# Patient Record
Sex: Male | Born: 1940 | Race: White | Hispanic: No | Smoking: Former smoker
Health system: Southern US, Community
[De-identification: ages and names within clinical notes are randomized; demographics above are authoritative.]

## PROBLEM LIST (undated history)

## (undated) DIAGNOSIS — J189 Pneumonia, unspecified organism: Secondary | ICD-10-CM

## (undated) DIAGNOSIS — I2510 Atherosclerotic heart disease of native coronary artery without angina pectoris: Secondary | ICD-10-CM

## (undated) DIAGNOSIS — K439 Ventral hernia without obstruction or gangrene: Secondary | ICD-10-CM

## (undated) DIAGNOSIS — Z86010 Personal history of colonic polyps: Secondary | ICD-10-CM

## (undated) DIAGNOSIS — L309 Dermatitis, unspecified: Secondary | ICD-10-CM

## (undated) DIAGNOSIS — K5520 Angiodysplasia of colon without hemorrhage: Secondary | ICD-10-CM

## (undated) DIAGNOSIS — M1990 Unspecified osteoarthritis, unspecified site: Secondary | ICD-10-CM

## (undated) DIAGNOSIS — E119 Type 2 diabetes mellitus without complications: Secondary | ICD-10-CM

## (undated) DIAGNOSIS — G629 Polyneuropathy, unspecified: Secondary | ICD-10-CM

## (undated) DIAGNOSIS — D649 Anemia, unspecified: Secondary | ICD-10-CM

## (undated) DIAGNOSIS — K219 Gastro-esophageal reflux disease without esophagitis: Secondary | ICD-10-CM

## (undated) DIAGNOSIS — C649 Malignant neoplasm of unspecified kidney, except renal pelvis: Secondary | ICD-10-CM

## (undated) DIAGNOSIS — S46219A Strain of muscle, fascia and tendon of other parts of biceps, unspecified arm, initial encounter: Secondary | ICD-10-CM

## (undated) DIAGNOSIS — I219 Acute myocardial infarction, unspecified: Secondary | ICD-10-CM

## (undated) DIAGNOSIS — Z9289 Personal history of other medical treatment: Secondary | ICD-10-CM

## (undated) DIAGNOSIS — I499 Cardiac arrhythmia, unspecified: Secondary | ICD-10-CM

## (undated) DIAGNOSIS — E785 Hyperlipidemia, unspecified: Secondary | ICD-10-CM

## (undated) DIAGNOSIS — I10 Essential (primary) hypertension: Secondary | ICD-10-CM

## (undated) DIAGNOSIS — E669 Obesity, unspecified: Secondary | ICD-10-CM

## (undated) HISTORY — DX: Angiodysplasia of colon without hemorrhage: K55.20

## (undated) HISTORY — PX: TONSILLECTOMY: SUR1361

## (undated) HISTORY — PX: COLONOSCOPY W/ BIOPSIES AND POLYPECTOMY: SHX1376

## (undated) HISTORY — DX: Essential (primary) hypertension: I10

## (undated) HISTORY — DX: Dermatitis, unspecified: L30.9

## (undated) HISTORY — PX: FRACTURE SURGERY: SHX138

## (undated) HISTORY — DX: Personal history of colonic polyps: Z86.010

## (undated) HISTORY — DX: Obesity, unspecified: E66.9

## (undated) HISTORY — PX: INGUINAL HERNIA REPAIR: SHX194

## (undated) HISTORY — DX: Malignant neoplasm of unspecified kidney, except renal pelvis: C64.9

## (undated) HISTORY — PX: COLONOSCOPY: SHX174

## (undated) HISTORY — DX: Hyperlipidemia, unspecified: E78.5

## (undated) HISTORY — DX: Strain of muscle, fascia and tendon of other parts of biceps, unspecified arm, initial encounter: S46.219A

## (undated) HISTORY — DX: Ventral hernia without obstruction or gangrene: K43.9

## (undated) HISTORY — PX: CORONARY ANGIOPLASTY: SHX604

---

## 1997-12-10 ENCOUNTER — Emergency Department (HOSPITAL_COMMUNITY): Admission: EM | Admit: 1997-12-10 | Discharge: 1997-12-10 | Payer: Self-pay | Admitting: Emergency Medicine

## 1998-01-23 ENCOUNTER — Ambulatory Visit (HOSPITAL_BASED_OUTPATIENT_CLINIC_OR_DEPARTMENT_OTHER): Admission: RE | Admit: 1998-01-23 | Discharge: 1998-01-23 | Payer: Self-pay | Admitting: General Surgery

## 1998-04-10 ENCOUNTER — Ambulatory Visit (HOSPITAL_BASED_OUTPATIENT_CLINIC_OR_DEPARTMENT_OTHER): Admission: RE | Admit: 1998-04-10 | Discharge: 1998-04-10 | Payer: Self-pay | Admitting: General Surgery

## 1998-09-09 DIAGNOSIS — C649 Malignant neoplasm of unspecified kidney, except renal pelvis: Secondary | ICD-10-CM

## 1998-09-09 HISTORY — DX: Malignant neoplasm of unspecified kidney, except renal pelvis: C64.9

## 1998-09-09 HISTORY — PX: NEPHRECTOMY: SHX65

## 1999-02-13 ENCOUNTER — Encounter: Payer: Self-pay | Admitting: Urology

## 1999-02-14 ENCOUNTER — Inpatient Hospital Stay (HOSPITAL_COMMUNITY): Admission: RE | Admit: 1999-02-14 | Discharge: 1999-02-20 | Payer: Self-pay | Admitting: Urology

## 1999-09-07 ENCOUNTER — Encounter: Payer: Self-pay | Admitting: Urology

## 1999-09-07 ENCOUNTER — Encounter: Admission: RE | Admit: 1999-09-07 | Discharge: 1999-09-07 | Payer: Self-pay | Admitting: Urology

## 2000-03-11 ENCOUNTER — Encounter: Payer: Self-pay | Admitting: Urology

## 2000-03-11 ENCOUNTER — Ambulatory Visit (HOSPITAL_COMMUNITY): Admission: RE | Admit: 2000-03-11 | Discharge: 2000-03-11 | Payer: Self-pay | Admitting: Urology

## 2001-08-03 ENCOUNTER — Ambulatory Visit (HOSPITAL_COMMUNITY): Admission: RE | Admit: 2001-08-03 | Discharge: 2001-08-03 | Payer: Self-pay | Admitting: Urology

## 2001-08-03 ENCOUNTER — Encounter: Payer: Self-pay | Admitting: Urology

## 2003-01-07 ENCOUNTER — Encounter: Admission: RE | Admit: 2003-01-07 | Discharge: 2003-01-07 | Payer: Self-pay | Admitting: Urology

## 2003-01-07 ENCOUNTER — Encounter: Payer: Self-pay | Admitting: Urology

## 2003-07-19 ENCOUNTER — Encounter: Admission: RE | Admit: 2003-07-19 | Discharge: 2003-10-17 | Payer: Self-pay | Admitting: Family Medicine

## 2004-11-29 ENCOUNTER — Ambulatory Visit: Payer: Self-pay | Admitting: Family Medicine

## 2005-01-03 ENCOUNTER — Ambulatory Visit: Payer: Self-pay | Admitting: Family Medicine

## 2005-02-20 ENCOUNTER — Ambulatory Visit: Payer: Self-pay | Admitting: Family Medicine

## 2005-02-28 ENCOUNTER — Ambulatory Visit: Payer: Self-pay | Admitting: Family Medicine

## 2006-07-10 ENCOUNTER — Ambulatory Visit: Payer: Self-pay | Admitting: Family Medicine

## 2006-07-25 ENCOUNTER — Ambulatory Visit: Payer: Self-pay | Admitting: Family Medicine

## 2006-07-25 LAB — CONVERTED CEMR LAB
ALT: 69 units/L — ABNORMAL HIGH (ref 0–40)
AST: 38 units/L — ABNORMAL HIGH (ref 0–37)
BUN: 15 mg/dL (ref 6–23)
Basophils Relative: 0.3 % (ref 0.0–1.0)
Calcium: 9.6 mg/dL (ref 8.4–10.5)
Chloride: 101 meq/L (ref 96–112)
Cholesterol: 198 mg/dL (ref 0–200)
Eosinophil percent: 5.7 % — ABNORMAL HIGH (ref 0.0–5.0)
Glucose, Bld: 172 mg/dL — ABNORMAL HIGH (ref 70–99)
HCT: 47.3 % (ref 39.0–52.0)
HDL: 36.8 mg/dL — ABNORMAL LOW (ref >39.0)
Hemoglobin: 15.5 g/dL (ref 13.0–17.0)
LDL Cholesterol: 131 mg/dL — ABNORMAL HIGH (ref 0–99)
Lymphocytes Relative: 29.7 % (ref 12.0–46.0)
MCV: 97.7 fL (ref 78.0–100.0)
Monocytes Absolute: 0.6 10*3/uL (ref 0.2–0.7)
Neutrophils Relative %: 51.9 % (ref 43.0–77.0)
PSA: 3.69 ng/mL (ref 0.10–4.00)
Potassium: 4.2 meq/L (ref 3.5–5.1)
TSH: 1.41 microintl units/mL (ref 0.35–5.50)
WBC: 4.8 10*3/uL (ref 4.5–10.5)

## 2006-08-06 ENCOUNTER — Ambulatory Visit: Payer: Self-pay | Admitting: Family Medicine

## 2006-08-28 ENCOUNTER — Ambulatory Visit: Payer: Self-pay | Admitting: Family Medicine

## 2006-11-27 ENCOUNTER — Ambulatory Visit: Payer: Self-pay | Admitting: Family Medicine

## 2006-11-27 LAB — CONVERTED CEMR LAB
Cholesterol: 208 mg/dL (ref 0–200)
Creatinine,U: 95.5 mg/dL
Direct LDL: 137.4 mg/dL
GFR calc non Af Amer: 65 mL/min
Glucose, Bld: 180 mg/dL — ABNORMAL HIGH (ref 70–99)
HDL: 37.1 mg/dL — ABNORMAL LOW (ref >39.0)
Potassium: 4 meq/L (ref 3.5–5.1)
Sodium: 137 meq/L (ref 135–145)
Total CHOL/HDL Ratio: 5.6
VLDL: 56 mg/dL — ABNORMAL HIGH (ref 0–40)

## 2007-04-23 DIAGNOSIS — I10 Essential (primary) hypertension: Secondary | ICD-10-CM | POA: Insufficient documentation

## 2007-08-18 DIAGNOSIS — J157 Pneumonia due to Mycoplasma pneumoniae: Secondary | ICD-10-CM

## 2007-09-08 ENCOUNTER — Ambulatory Visit: Payer: Self-pay | Admitting: Family Medicine

## 2007-11-06 ENCOUNTER — Ambulatory Visit: Payer: Self-pay | Admitting: Family Medicine

## 2007-11-06 LAB — CONVERTED CEMR LAB
ALT: 75 units/L — ABNORMAL HIGH (ref 0–53)
AST: 44 units/L — ABNORMAL HIGH (ref 0–37)
Albumin: 3.8 g/dL (ref 3.5–5.2)
Alkaline Phosphatase: 50 units/L (ref 39–117)
BUN: 14 mg/dL (ref 6–23)
Basophils Absolute: 0.1 10*3/uL (ref 0.0–0.1)
Bilirubin Urine: NEGATIVE
Bilirubin, Direct: 0.2 mg/dL (ref 0.0–0.3)
Blood in Urine, dipstick: NEGATIVE
Calcium: 9.5 mg/dL (ref 8.4–10.5)
Chloride: 99 meq/L (ref 96–112)
Cholesterol: 206 mg/dL (ref 0–200)
Eosinophils Absolute: 0.4 10*3/uL (ref 0.0–0.6)
Eosinophils Relative: 7.2 % — ABNORMAL HIGH (ref 0.0–5.0)
GFR calc Af Amer: 60 mL/min
GFR calc non Af Amer: 50 mL/min
Glucose, Bld: 209 mg/dL — ABNORMAL HIGH (ref 70–99)
HDL: 34.7 mg/dL — ABNORMAL LOW (ref >39.0)
Hgb A1c MFr Bld: 8.2 % — ABNORMAL HIGH (ref 4.6–6.0)
Ketones, urine, test strip: NEGATIVE
Lymphocytes Relative: 26.9 % (ref 12.0–46.0)
MCHC: 33 g/dL (ref 30.0–36.0)
MCV: 98.5 fL (ref 78.0–100.0)
Microalb Creat Ratio: 36.3 mg/g — ABNORMAL HIGH (ref 0.0–30.0)
Monocytes Relative: 11.5 % — ABNORMAL HIGH (ref 3.0–11.0)
Neutro Abs: 3 10*3/uL (ref 1.4–7.7)
Nitrite: NEGATIVE
PSA: 1.06 ng/mL (ref 0.10–4.00)
Platelets: 233 10*3/uL (ref 150–400)
RBC: 4.9 M/uL (ref 4.22–5.81)
Specific Gravity, Urine: 1.02
TSH: 1.75 microintl units/mL (ref 0.35–5.50)
Triglycerides: 278 mg/dL (ref 0–149)
WBC: 5.6 10*3/uL (ref 4.5–10.5)

## 2007-11-13 ENCOUNTER — Ambulatory Visit: Payer: Self-pay | Admitting: Family Medicine

## 2007-11-13 DIAGNOSIS — E785 Hyperlipidemia, unspecified: Secondary | ICD-10-CM

## 2007-11-13 DIAGNOSIS — L301 Dyshidrosis [pompholyx]: Secondary | ICD-10-CM | POA: Insufficient documentation

## 2007-11-13 DIAGNOSIS — E669 Obesity, unspecified: Secondary | ICD-10-CM

## 2007-11-19 ENCOUNTER — Ambulatory Visit: Payer: Self-pay | Admitting: Internal Medicine

## 2007-12-03 ENCOUNTER — Ambulatory Visit: Payer: Self-pay | Admitting: Internal Medicine

## 2007-12-03 ENCOUNTER — Encounter: Payer: Self-pay | Admitting: Internal Medicine

## 2007-12-03 ENCOUNTER — Encounter: Payer: Self-pay | Admitting: Family Medicine

## 2007-12-15 ENCOUNTER — Ambulatory Visit: Payer: Self-pay | Admitting: Family Medicine

## 2008-02-05 ENCOUNTER — Telehealth: Payer: Self-pay | Admitting: Family Medicine

## 2008-03-18 ENCOUNTER — Ambulatory Visit: Payer: Self-pay | Admitting: Family Medicine

## 2008-03-18 LAB — CONVERTED CEMR LAB
Chloride: 102 meq/L (ref 96–112)
Cholesterol: 154 mg/dL (ref 0–200)
GFR calc Af Amer: 46 mL/min
GFR calc non Af Amer: 38 mL/min
Glucose, Bld: 93 mg/dL (ref 70–99)
Hgb A1c MFr Bld: 7.2 % — ABNORMAL HIGH (ref 4.6–6.0)
Microalb Creat Ratio: 18.7 mg/g (ref 0.0–30.0)
Microalb, Ur: 1.3 mg/dL (ref 0.0–1.9)
Potassium: 4.7 meq/L (ref 3.5–5.1)
Sodium: 140 meq/L (ref 135–145)
VLDL: 26 mg/dL (ref 0–40)

## 2008-03-25 ENCOUNTER — Telehealth: Payer: Self-pay | Admitting: Family Medicine

## 2008-05-31 ENCOUNTER — Encounter: Admission: RE | Admit: 2008-05-31 | Discharge: 2008-05-31 | Payer: Self-pay | Admitting: General Surgery

## 2008-06-08 ENCOUNTER — Ambulatory Visit: Payer: Self-pay | Admitting: Family Medicine

## 2008-06-08 LAB — CONVERTED CEMR LAB
BUN: 20 mg/dL (ref 6–23)
Creatinine, Ser: 1.3 mg/dL (ref 0.4–1.5)
GFR calc Af Amer: 71 mL/min
GFR calc non Af Amer: 59 mL/min
Hgb A1c MFr Bld: 6.6 % — ABNORMAL HIGH (ref 4.6–6.0)
Potassium: 5 meq/L (ref 3.5–5.1)

## 2008-06-17 ENCOUNTER — Ambulatory Visit: Payer: Self-pay | Admitting: Family Medicine

## 2008-08-08 DIAGNOSIS — J069 Acute upper respiratory infection, unspecified: Secondary | ICD-10-CM | POA: Insufficient documentation

## 2008-08-15 ENCOUNTER — Ambulatory Visit: Payer: Self-pay | Admitting: Family Medicine

## 2008-11-11 ENCOUNTER — Ambulatory Visit: Payer: Self-pay | Admitting: Family Medicine

## 2008-11-11 LAB — CONVERTED CEMR LAB
ALT: 57 units/L — ABNORMAL HIGH (ref 0–53)
Basophils Relative: 0.7 % (ref 0.0–3.0)
Bilirubin, Direct: 0.1 mg/dL (ref 0.0–0.3)
CO2: 30 meq/L (ref 19–32)
Calcium: 9.3 mg/dL (ref 8.4–10.5)
Eosinophils Relative: 6.9 % — ABNORMAL HIGH (ref 0.0–5.0)
Glucose, Bld: 169 mg/dL — ABNORMAL HIGH (ref 70–99)
Hemoglobin: 16.2 g/dL (ref 13.0–17.0)
LDL Cholesterol: 122 mg/dL — ABNORMAL HIGH (ref 0–99)
Lymphocytes Relative: 27.5 % (ref 12.0–46.0)
Microalb, Ur: 16.9 mg/dL — ABNORMAL HIGH (ref 0.0–1.9)
Monocytes Relative: 10.9 % (ref 3.0–12.0)
Neutro Abs: 2.8 10*3/uL (ref 1.4–7.7)
Nitrite: NEGATIVE
RBC: 4.68 M/uL (ref 4.22–5.81)
RDW: 12 % (ref 11.5–14.6)
Sodium: 140 meq/L (ref 135–145)
TSH: 1.8 microintl units/mL (ref 0.35–5.50)
Total CHOL/HDL Ratio: 5.2
Total Protein: 6.7 g/dL (ref 6.0–8.3)
Urobilinogen, UA: 0.2
WBC Urine, dipstick: NEGATIVE
WBC: 5.3 10*3/uL (ref 4.5–10.5)

## 2008-11-21 ENCOUNTER — Ambulatory Visit: Payer: Self-pay | Admitting: Family Medicine

## 2008-11-29 ENCOUNTER — Telehealth: Payer: Self-pay | Admitting: Family Medicine

## 2008-12-05 ENCOUNTER — Ambulatory Visit: Payer: Self-pay | Admitting: Family Medicine

## 2008-12-05 DIAGNOSIS — H6120 Impacted cerumen, unspecified ear: Secondary | ICD-10-CM

## 2009-01-02 ENCOUNTER — Ambulatory Visit: Payer: Self-pay | Admitting: Family Medicine

## 2009-02-10 ENCOUNTER — Emergency Department (HOSPITAL_COMMUNITY): Admission: EM | Admit: 2009-02-10 | Discharge: 2009-02-11 | Payer: Self-pay | Admitting: Emergency Medicine

## 2009-04-27 ENCOUNTER — Ambulatory Visit: Payer: Self-pay | Admitting: Family Medicine

## 2009-04-27 LAB — CONVERTED CEMR LAB
CO2: 31 meq/L (ref 19–32)
Chloride: 104 meq/L (ref 96–112)
Creatinine, Ser: 1.3 mg/dL (ref 0.4–1.5)
Sodium: 141 meq/L (ref 135–145)

## 2009-05-04 ENCOUNTER — Ambulatory Visit: Payer: Self-pay | Admitting: Family Medicine

## 2009-05-04 DIAGNOSIS — T6391XA Toxic effect of contact with unspecified venomous animal, accidental (unintentional), initial encounter: Secondary | ICD-10-CM

## 2009-08-21 LAB — HM DIABETES EYE EXAM: HM Diabetic Eye Exam: NORMAL

## 2009-09-18 ENCOUNTER — Encounter: Payer: Self-pay | Admitting: *Deleted

## 2009-11-17 ENCOUNTER — Ambulatory Visit: Payer: Self-pay | Admitting: Family Medicine

## 2009-11-17 LAB — CONVERTED CEMR LAB
AST: 42 units/L — ABNORMAL HIGH (ref 0–37)
Albumin: 3.9 g/dL (ref 3.5–5.2)
Basophils Absolute: 0.1 10*3/uL (ref 0.0–0.1)
Bilirubin Urine: NEGATIVE
Blood in Urine, dipstick: NEGATIVE
CO2: 32 meq/L (ref 19–32)
Chloride: 107 meq/L (ref 96–112)
Direct LDL: 151.3 mg/dL
Eosinophils Absolute: 0.4 10*3/uL (ref 0.0–0.7)
Glucose, Urine, Semiquant: NEGATIVE
HCT: 45 % (ref 39.0–52.0)
HDL: 44.5 mg/dL (ref >39.00)
Hgb A1c MFr Bld: 7.2 % — ABNORMAL HIGH (ref 4.6–6.5)
Ketones, urine, test strip: NEGATIVE
Lymphocytes Relative: 28.8 % (ref 12.0–46.0)
Lymphs Abs: 1.3 10*3/uL (ref 0.7–4.0)
Microalb Creat Ratio: 197.7 mg/g — ABNORMAL HIGH (ref 0.0–30.0)
Monocytes Relative: 11.6 % (ref 3.0–12.0)
Platelets: 216 10*3/uL (ref 150.0–400.0)
RDW: 12.8 % (ref 11.5–14.6)
Sodium: 141 meq/L (ref 135–145)
TSH: 1.88 microintl units/mL (ref 0.35–5.50)
Total CHOL/HDL Ratio: 5
Total Protein: 7 g/dL (ref 6.0–8.3)
Triglycerides: 202 mg/dL — ABNORMAL HIGH (ref 0.0–149.0)
pH: 5

## 2009-11-24 ENCOUNTER — Ambulatory Visit: Payer: Self-pay | Admitting: Family Medicine

## 2009-11-24 LAB — HM DIABETES FOOT EXAM

## 2009-12-25 ENCOUNTER — Ambulatory Visit: Payer: Self-pay | Admitting: Family Medicine

## 2010-05-07 ENCOUNTER — Ambulatory Visit: Payer: Self-pay | Admitting: Family Medicine

## 2010-05-07 LAB — CONVERTED CEMR LAB
Calcium: 9.1 mg/dL (ref 8.4–10.5)
Chloride: 99 meq/L (ref 96–112)
Creatinine, Ser: 1.1 mg/dL (ref 0.4–1.5)
GFR calc non Af Amer: 71.27 mL/min (ref >60)
Hgb A1c MFr Bld: 7.6 % — ABNORMAL HIGH (ref 4.6–6.5)

## 2010-05-18 ENCOUNTER — Ambulatory Visit: Payer: Self-pay | Admitting: Family Medicine

## 2010-08-21 ENCOUNTER — Ambulatory Visit: Payer: Self-pay | Admitting: Family Medicine

## 2010-08-21 LAB — CONVERTED CEMR LAB
CO2: 31 meq/L (ref 19–32)
Calcium: 9.1 mg/dL (ref 8.4–10.5)
Creatinine, Ser: 1.3 mg/dL (ref 0.4–1.5)
GFR calc non Af Amer: 57.09 mL/min — ABNORMAL LOW (ref >60.00)
Hgb A1c MFr Bld: 7.8 % — ABNORMAL HIGH (ref 4.6–6.5)
Sodium: 140 meq/L (ref 135–145)

## 2010-08-22 ENCOUNTER — Encounter: Payer: Self-pay | Admitting: Internal Medicine

## 2010-08-28 ENCOUNTER — Ambulatory Visit: Payer: Self-pay | Admitting: Family Medicine

## 2010-09-09 HISTORY — PX: INGUINAL HERNIA REPAIR: SUR1180

## 2010-09-24 ENCOUNTER — Encounter
Admission: RE | Admit: 2010-09-24 | Discharge: 2010-09-24 | Payer: Self-pay | Source: Home / Self Care | Attending: General Surgery | Admitting: General Surgery

## 2010-10-09 NOTE — Assessment & Plan Note (Signed)
Summary: 30 min ov/njr   Vital Signs:  Patient profile:   70 year old male Height:      71.5 inches Weight:      254 pounds BMI:     35.06 Temp:     98.6 degrees F oral BP sitting:   150 / 98  (left arm) Cuff size:   regular  Vitals Entered By: Kern Reap CMA Duncan Dull) (November 24, 2009 8:53 AM)  Reason for Visit cpx  History of Present Illness: Charles [name] is a 70 year old male, nonsmoker, who comes in today for evaluation of hypertension, diabetes, obesity.  His diabetes is treated with Glucophage 1000 mg b.i.d.  His blood sugar is 157 with an A1c of 7.2%.  We discussed various options to decrease his blood sugar to normal and to decrease his hemoglobin A1c below 6.5%.  Patient elects medication to stay the same but start diet and exercise.  BP 150/98 here at home.  He states is 135/92.  However, he doesn't check it daily.  Advised to check his BP daily and return in 4 weeks for follow-up.  If blood pressure above goal which is 130/80.  His weight is 254.  He also takes one aspirin tablet daily.  He gets   Routine eye care.,dental care, colonoscopy done, and GI, tetanus, 2003, seasonal flu 2010, Pneumovax 2007, diabetic eye exam December 2010 no retinopathy.  He also uses the Lidex p.r.n. for eczema.  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  Preventive Screening-Counseling & Management  Alcohol-Tobacco     Smoking Status: quit  Allergies: 1)  ! * Bee Stings  Past History:  Past medical, surgical, family and social histories (including risk factors) reviewed, and no changes noted (except as noted below).  Past Medical History: Reviewed history from 06/17/2008 and no changes required. High Cholesterol Obesity Diabetes mellitus, type II Hypertension eczema muscle tear, right biceps ventral hernia left inguinal hernia repair x 3  Past Surgical History: Reviewed history from 04/23/2007 and no changes required. Hernia Surgery x3 PMH-FH-SH  reviewed-no changes except otherwise noted  Family History: Reviewed history from 04/23/2007 and no changes required. Family History Diabetes 1st degree relative Family History Other cancer Family History of Cardiovascular disorder Family History of Respiratory disease  Social History: Reviewed history from 03/18/2008 and no changes required. Occupation: Single Alcohol use-yes Drug use-no Retired Former Smoker     quit 2000 Smoking Status:  quit  Review of Systems      See HPI  Physical Exam  General:  Well-developed,well-nourished,in no acute distress; alert,appropriate and cooperative throughout examination Head:  Normocephalic and atraumatic without obvious abnormalities. No apparent alopecia or balding. Eyes:  No corneal or conjunctival inflammation noted. EOMI. Perrla. Funduscopic exam benign, without hemorrhages, exudates or papilledema. Vision grossly normal. Ears:  External ear exam shows no significant lesions or deformities.  Otoscopic examination reveals clear canals, tympanic membranes are intact bilaterally without bulging, retraction, inflammation or discharge. Hearing is decreased because of ear wax Nose:  External nasal examination shows no deformity or inflammation. Nasal mucosa are pink and moist without lesions or exudates. Mouth:  Oral mucosa and oropharynx without lesions or exudates.  Teeth in good repair. Neck:  No deformities, masses, or tenderness noted. Chest Wall:  No deformities, masses, tenderness or gynecomastia noted. Breasts:  No masses or gynecomastia noted Lungs:  Normal respiratory effort, chest expands symmetrically. Lungs are clear to auscultation, no crackles or wheezes. Heart:  Normal rate and regular rhythm.  S1 and S2 normal without gallop, murmur, click, rub or other extra sounds. Abdomen:  ventral hernia in the previous scar.  Otherwise abdominal exam is negative Rectal:  No external abnormalities noted. Normal sphincter tone. No rectal  masses or tenderness. Genitalia:  Testes bilaterally descended without nodularity, tenderness or masses. No scrotal masses or lesions. No penis lesions or urethral discharge. Prostate:  Prostate gland firm and smooth, no enlargement, nodularity, tenderness, mass, asymmetry or induration. Msk:  No deformity or scoliosis noted of thoracic or lumbar spine.   Pulses:  R and L carotid,radial,femoral,dorsalis pedis and posterior tibial pulses are full and equal bilaterally Extremities:  No clubbing, cyanosis, edema, or deformity noted with normal full range of motion of all joints.   Neurologic:  No cranial nerve deficits noted. Station and gait are normal. Plantar reflexes are down-going bilaterally. DTRs are symmetrical throughout. Sensory, motor and coordinative functions appear intact. Skin:  total body skin exam negative except for eczema of his feet Cervical Nodes:  No lymphadenopathy noted Axillary Nodes:  No palpable lymphadenopathy Inguinal Nodes:  No significant adenopathy Psych:  Cognition and judgment appear intact. Alert and cooperative with normal attention span and concentration. No apparent delusions, illusions, hallucinations  Diabetes Management Exam:    Foot Exam (with socks and/or shoes not present):       Sensory-Pinprick/Light touch:          Left medial foot (L-4): normal          Left dorsal foot (L-5): normal          Left lateral foot (S-1): normal          Right medial foot (L-4): normal          Right dorsal foot (L-5): normal          Right lateral foot (S-1): normal       Sensory-Monofilament:          Left foot: normal          Right foot: normal       Inspection:          Left foot: normal          Right foot: normal       Nails:          Left foot: normal          Right foot: normal    Eye Exam:       Eye Exam done elsewhere          Date: 08/21/2009          Results: normal          Done by: Emily Filbert   Impression & Recommendations:  Problem # 1:   OVERWEIGHT (ICD-278.02) Assessment Unchanged  Orders: Prescription Created Electronically [phone]) Subsequent annual wellness visit with prevention plan (U0454)  Problem # 2:  HYPERLIPIDEMIA (ICD-272.4) Assessment: Deteriorated  Orders: EKG w/ Interpretation (93000)  Problem # 3:  HYPERTENSION (ICD-401.9) Assessment: Deteriorated  His updated medication list for this problem includes:    Lisinopril 40 Mg Tabs (Lisinopril) .Marland Kitchen... 2 by mouth qam  Orders: Prescription Created Electronically [phone]) Subsequent annual wellness visit with prevention plan (B1478) EKG w/ Interpretation (93000)  Problem # 4:  DIABETES MELLITUS, TYPE II (ICD-250.00) Assessment: Deteriorated  His updated medication list for this problem includes:    Lisinopril 40 Mg Tabs (Lisinopril) .Marland Kitchen... 2 by mouth qam    Adult Aspirin Low Strength 81 Mg Tbdp (Aspirin) ..... Once daily  Glucophage 1000 Mg Tabs (Metformin hcl) .Marland Kitchen... Take 1 tablet by mouth two times a day  Orders: Prescription Created Electronically [phone]) Subsequent annual wellness visit with prevention plan (N8295)  Complete Medication List: 1)  Lisinopril 40 Mg Tabs (Lisinopril) .... 2 by mouth qam 2)  Adult Aspirin Low Strength 81 Mg Tbdp (Aspirin) .... Once daily 3)  Fish Oil Double Strength 1200 Mg Caps (Omega-3 fatty acids) .... 2-3 x weekly 4)  Glucophage 1000 Mg Tabs (Metformin hcl) .... Take 1 tablet by mouth two times a day 5)  Lidex 0.05 % Crea (Fluocinonide) .... Apply at bedtime 6)  Onetouch Ultra Test Strp (Glucose blood) .... Test once daily as directed 7)  Bd Ultra-fine 33 Lancets Misc (Lancets) .... Test once daily as directed by your doctor dx 250.0 8)  Epipen 2-pak 0.3 Mg/0.49ml Devi (Epinephrine) .... Uad 9)  Lutein 20 Mg Caps (Lutein) .... Once daily 10)  Vitamin C-acerola 500 Mg Chew (Ascorbic acid) .... Once daily 11)  Multivitamins Tabs (Multiple vitamin) .... Once daily  Patient Instructions: 1)  use the ear wax kit as  directed. 2)  Check a morning blood pressure and blood sugar daily.  Return in 4 weeks for follow-up with the data. 3)  Continue your exercise program to decrease her carbohydrate intake 4)  Schedule a colonoscopy/sigmoidoscopy to help detect colon cancer. 5)  decrease the aspirin to one tablet Monday, Wednesday, Friday, because of the bruising Prescriptions: EPIPEN 2-PAK 0.3 MG/0.3ML DEVI (EPINEPHRINE) UAD  #1 x 1   Entered and Authorized by:   Roderick Pee MD   Signed by:   Roderick Pee MD on 11/24/2009   Method used:   Electronically to        [facility] (retail)       [address]       [place], [place]  [address]       Ph: [phone]       Fax: [phone]   RxID:   [id number] BD ULTRA-FINE 33 LANCETS  MISC (LANCETS) test once daily as directed by your doctor dx 250.0  #100 x 3   Entered and Authorized by:   Roderick Pee MD   Signed by:   Roderick Pee MD on 11/24/2009   Method used:   Electronically to        [facility] (retail)       [address]       [place], [place]  [address]       Ph: [phone]       Fax: [phone]   RxID:   [id number] [place] TEST  STRP (GLUCOSE BLOOD) test once daily as directed  #50 x 10   Entered and Authorized by:   Roderick Pee MD   Signed by:   Roderick Pee MD on 11/24/2009   Method used:   Electronically to        [facility] (retail)       [address]       [place], [place]  [address]       Ph: [phone]       Fax: [phone]   RxID:   [id number] LIDEX 0.05 %  CREA (FLUOCINONIDE) apply at bedtime  #60 grms x 6   Entered and Authorized by:  Roderick Pee MD   Signed by:   Roderick Pee MD on 11/24/2009   Method used:   Electronically to        [facility] (retail)       [address]       [place], [place]  [address]       Ph: [phone]       Fax: [phone]    RxID:   [id number] GLUCOPHAGE 1000 MG  TABS (METFORMIN HCL) Take 1 tablet by mouth two times a day  #200 x 4   Entered and Authorized by:   Roderick Pee MD   Signed by:   Roderick Pee MD on 11/24/2009   Method used:   Electronically to        [facility] (retail)       [address]       [place], [place]  [address]       Ph: [phone]       Fax: [phone]   RxID:   [id number] LISINOPRIL 40 MG  TABS (LISINOPRIL) 2 by mouth qam  #200 x 3   Entered and Authorized by:   Roderick Pee MD   Signed by:   Roderick Pee MD on 11/24/2009   Method used:   Electronically to        [facility] (retail)       [address]       [place], [place]  [address]       Ph: [phone]       Fax: [phone]   RxID:   [id number]    Immunization History:  Influenza Immunization History:    Influenza:  historical (07/10/2009)

## 2010-10-09 NOTE — Miscellaneous (Signed)
Summary: dm eye exam   Clinical Lists Changes  Observations: Added new observation of EYES COMMENT: 08/2010 (09/18/2009 14:10) Added new observation of EYE EXAM BY: gould (08/16/2009 14:11) Added new observation of DMEYEEXMRES: normal (08/16/2009 14:11) Added new observation of DIAB EYE EX: normal (08/16/2009 14:11)      Diabetes Management Exam:    Eye Exam:       Eye Exam done elsewhere          Date: 08/16/2009          Results: normal          Done by: Emily Filbert

## 2010-10-09 NOTE — Assessment & Plan Note (Signed)
Summary: 4 month rov/njr rsc bmp/njr   Vital Signs:  Patient profile:   69 year old male Weight:      256 pounds Temp:     98.2 degrees F oral BP sitting:   140 / 90  (left arm) Cuff size:   regular  Vitals Entered By: Kathrynn Speed CMA (May 18, 2010 9:05 AM) CC: 4 mth rov, src Is Patient Diabetic? Yes   CC:  4 mth rov and src.  History of Present Illness: Charles [name] is a 70 year old retired male, who comes in today for follow-up of type 2 diabetes and hypertension.  His blood sugar was 175 and hemoglobin A1c is 7.6%.  He states when he follows his diet and exercises.  His blood sugar drops down in the 70s.  He is supposed to be taking Glucophage 1000 mg b.i.d., but he can't remember to consistently take his evening dose.  He is therefore, only taking Glucophage 1000 mg q.a.m. along with the 5 mg of glipizide.  He has been to see the diabetic teaching nurse.  Recently, he started exercising, yoga at the line.  We discussed various options.  Patient would like to try exercise and not increase his medication or take an evening dose since he can't remember to take it anyway.  BP at home 120/80  Preventive Screening-Counseling & Management  Alcohol-Tobacco     Smoking Status: quit     Smoking Cessation Counseling: yes  Current Medications (verified): 1)  Lisinopril 40 Mg  Tabs (Lisinopril) .... 2 By Mouth Qam 2)  Adult Aspirin Low Strength 81 Mg  Tbdp (Aspirin) .... Once Daily 3)  Glucophage 1000 Mg  Tabs (Metformin Hcl) .... Take 1 Tablet By Mouth Two Times A Day 4)  Lidex 0.05 %  Crea (Fluocinonide) .... Apply At Bedtime 5)  Onetouch Ultra Test  Strp (Glucose Blood) .... Test Once Daily As Directed 6)  Bd Ultra-Fine 33 Lancets  Misc (Lancets) .... Test Once Daily As Directed By Your Doctor Dx 250.0 7)  Epipen 2-Pak 0.3 Mg/0.68ml Devi (Epinephrine) .... Uad 8)  Lutein 20 Mg Caps (Lutein) .... Once Daily 9)  Vitamin C-Acerola 500 Mg Chew (Ascorbic Acid) .... Once Daily 10)   Multivitamins  Tabs (Multiple Vitamin) .... Once Daily 11)  Glucotrol 5 Mg Tabs (Glipizide) .... Take 1 Tablet By Mouth Every Morning  Allergies (verified): 1)  ! * Bee Stings  Past History:  Past medical, surgical, family and social histories (including risk factors) reviewed for relevance to current acute and chronic problems.  Past Medical History: Reviewed history from 06/17/2008 and no changes required. High Cholesterol Obesity Diabetes mellitus, type II Hypertension eczema muscle tear, right biceps ventral hernia left inguinal hernia repair x 3  Past Surgical History: Reviewed history from 04/23/2007 and no changes required. Hernia Surgery x3  Family History: Reviewed history from 04/23/2007 and no changes required. Family History Diabetes 1st degree relative Family History Other cancer Family History of Cardiovascular disorder Family History of Respiratory disease  Social History: Reviewed history from 11/24/2009 and no changes required. Occupation: Single Alcohol use-yes Drug use-no Retired Former Smoker     quit 2000  Review of Systems      See HPI       Flu Vaccine Consent Questions     Do you have a history of severe allergic reactions to this vaccine? no    Any prior history of allergic reactions to egg and/or gelatin? no    Do you have a  sensitivity to the preservative Thimersol? no    Do you have a past history of Guillan-Barre Syndrome? no    Do you currently have an acute febrile illness? no    Have you ever had a severe reaction to latex? no    Vaccine information given and explained to patient? yes    Are you currently pregnant? no    Lot Number:[id number]   Exp Date:03/09/2011   Site Given  Left Deltoid IM   Physical Exam  General:  Well-developed,well-nourished,in no acute distress; alert,appropriate and cooperative throughout examination   Impression & Recommendations:  Problem # 1:  HYPERTENSION (ICD-401.9) Assessment  Improved  His updated medication list for this problem includes:    Lisinopril 40 Mg Tabs (Lisinopril) .Marland Kitchen... 2 by mouth qam  Problem # 2:  DIABETES MELLITUS, TYPE II (ICD-250.00) Assessment: Unchanged  His updated medication list for this problem includes:    Lisinopril 40 Mg Tabs (Lisinopril) .Marland Kitchen... 2 by mouth qam    Adult Aspirin Low Strength 81 Mg Tbdp (Aspirin) ..... Once daily    Glucophage 1000 Mg Tabs (Metformin hcl) .Marland Kitchen... Take 1 tablet by mouth two times a day    Glucotrol 5 Mg Tabs (Glipizide) .Marland Kitchen... Take 1 tablet by mouth every morning  Complete Medication List: 1)  Lisinopril 40 Mg Tabs (Lisinopril) .... 2 by mouth qam 2)  Adult Aspirin Low Strength 81 Mg Tbdp (Aspirin) .... Once daily 3)  Glucophage 1000 Mg Tabs (Metformin hcl) .... Take 1 tablet by mouth two times a day 4)  Lidex 0.05 % Crea (Fluocinonide) .... Apply at bedtime 5)  Onetouch Ultra Test Strp (Glucose blood) .... Test once daily as directed 6)  Bd Ultra-fine 33 Lancets Misc (Lancets) .... Test once daily as directed by your doctor dx 250.0 7)  Epipen 2-pak 0.3 Mg/0.72ml Devi (Epinephrine) .... Uad 8)  Lutein 20 Mg Caps (Lutein) .... Once daily 9)  Vitamin C-acerola 500 Mg Chew (Ascorbic acid) .... Once daily 10)  Multivitamins Tabs (Multiple vitamin) .... Once daily 11)  Glucotrol 5 Mg Tabs (Glipizide) .... Take 1 tablet by mouth every morning  Other Orders: Admin 1st Vaccine ([address]) Flu Vaccine 42yrs + ([address])  Patient Instructions: 1)  Please schedule a follow-up appointment in 3 months..250.00 2)  BMP prior to visit, ICD-9: 3)  HbgA1C prior to visit, ICD-9:

## 2010-10-09 NOTE — Assessment & Plan Note (Signed)
Summary: `1 month rov/njr   Vital Signs:  Patient profile:   70 year old male Weight:      253 pounds Temp:     98.1 degrees F oral BP sitting:   140 / 96  (left arm) Cuff size:   regular  Vitals Entered By: Kern Reap CMA Duncan Dull) (December 25, 2009 9:35 AM) CC: follow-up visit, sore toe on right foot Is Patient Diabetic? Yes   CC:  follow-up visit and sore toe on right foot.  History of Present Illness: Charles [name] is a 70 year old male, nonsmoker, who comes in today for evaluation of hearing loss, and diabetes.  His last fasting blood sugar a month ago, was 157, with a hemoglobin A1c of 7.3%.  He's not been very compliant with his diet.  He doesn't exercise on a regular basis.  Random blood sugar this morning, 190.  We talked about various options.  But said the Glucotrol to 5 mg q.a.m. to the Glucophage 1000 mg b.i.d. to get his fasting sugars below 100 and A1c down to 6.5%.  He's been using ear wax drops.  He was taken to the treatment room via irrigation and suction by me.  The wax was removed   No complications  A new problem is pain in his right fifth toe.  He jammed it last week.  Allergies: 1)  ! * Bee Stings  Past History:  Past medical, surgical, family and social histories (including risk factors) reviewed for relevance to current acute and chronic problems.  Past Medical History: Reviewed history from 06/17/2008 and no changes required. High Cholesterol Obesity Diabetes mellitus, type II Hypertension eczema muscle tear, right biceps ventral hernia left inguinal hernia repair x 3  Past Surgical History: Reviewed history from 04/23/2007 and no changes required. Hernia Surgery x3  Family History: Reviewed history from 04/23/2007 and no changes required. Family History Diabetes 1st degree relative Family History Other cancer Family History of Cardiovascular disorder Family History of Respiratory disease  Social History: Reviewed history from 11/24/2009 and  no changes required. Occupation: Single Alcohol use-yes Drug use-no Retired Former Smoker     quit 2000  Review of Systems      See HPI  Physical Exam  General:  Well-developed,well-nourished,in no acute distress; alert,appropriate and cooperative throughout examination Head:  Normocephalic and atraumatic without obvious abnormalities. No apparent alopecia or balding. Eyes:  No corneal or conjunctival inflammation noted. EOMI. Perrla. Funduscopic exam benign, without hemorrhages, exudates or papilledema. Vision grossly normal. Ears:  bilateral cerumen impactions removed by me Msk:  swelling right fifth toe..... also some bleeding into the soft tissues.   Impression & Recommendations:  Problem # 1:  IMPACTED CERUMEN (ICD-380.4) Assessment Unchanged  Problem # 2:  DIABETES MELLITUS, TYPE II (ICD-250.00) Assessment: Unchanged  His updated medication list for this problem includes:    Lisinopril 40 Mg Tabs (Lisinopril) .Marland Kitchen... 2 by mouth qam    Adult Aspirin Low Strength 81 Mg Tbdp (Aspirin) ..... Once daily    Glucophage 1000 Mg Tabs (Metformin hcl) .Marland Kitchen... Take 1 tablet by mouth two times a day    Glucotrol 5 Mg Tabs (Glipizide) .Marland Kitchen... Take 1 tablet by mouth every morning  Complete Medication List: 1)  Lisinopril 40 Mg Tabs (Lisinopril) .... 2 by mouth qam 2)  Adult Aspirin Low Strength 81 Mg Tbdp (Aspirin) .... Once daily 3)  Fish Oil Double Strength 1200 Mg Caps (Omega-3 fatty acids) .... 2-3 x weekly 4)  Glucophage 1000 Mg Tabs (Metformin hcl) .... Take  1 tablet by mouth two times a day 5)  Lidex 0.05 % Crea (Fluocinonide) .... Apply at bedtime 6)  Onetouch Ultra Test Strp (Glucose blood) .... Test once daily as directed 7)  Bd Ultra-fine 33 Lancets Misc (Lancets) .... Test once daily as directed by your doctor dx 250.0 8)  Epipen 2-pak 0.3 Mg/0.96ml Devi (Epinephrine) .... Uad 9)  Lutein 20 Mg Caps (Lutein) .... Once daily 10)  Vitamin C-acerola 500 Mg Chew (Ascorbic acid)  .... Once daily 11)  Multivitamins Tabs (Multiple vitamin) .... Once daily 12)  Glucotrol 5 Mg Tabs (Glipizide) .... Take 1 tablet by mouth every morning  Patient Instructions: 1)  add Glucotrol, 5 mg prior to breakfast.  Check a fasting blood sugar Monday, Wednesday, Friday, to follow your diet as closely as she can and when your toe feels better.  Walk 20 minutes daily. 2)  Please schedule a follow-up appointment in 3 months.Marland Kitchen..250.00 3)  BMP prior to visit, ICD-9: 4)  HbgA1C prior to visit, ICD-9: Prescriptions: GLUCOTROL 5 MG TABS (GLIPIZIDE) Take 1 tablet by mouth every morning  #100 x 3   Entered and Authorized by:   Roderick Pee MD   Signed by:   Roderick Pee MD on 12/25/2009   Method used:   Electronically to        [facility] (retail)       [address]       [place], [place]  [address]       Ph: [phone]       Fax: [phone]   RxID:   [id number]

## 2010-10-11 NOTE — Assessment & Plan Note (Signed)
Summary: 3 month rov/njr   Vital Signs:  Patient profile:   70 year old male Weight:      250 pounds Temp:     98.2 degrees F oral BP sitting:   130 / 78  (left arm) Cuff size:   large  Vitals Entered By: Kern Reap CMA Duncan Dull) (August 28, 2010 11:52 AM) CC: 3 mon rov---  hernia conerns ?   CC:  3 mon rov---  hernia conerns ?Marland Kitchen  History of Present Illness: Charles [name] is a 70 year old, married man nonsmoker, who comes in today for follow-up of diabetes, type II.  His fasting blood sugars 170 with a hemoglobin A1c of 7.8%.  However, he is not taking his medication as directed.  He states after he takes his Glucotrol in the morning.  He develops episodes of hypoglycemia.  Any skipping his evening, Glucophage tablet  Allergies: 1)  ! * Bee Stings  Past History:  Past medical, surgical, family and social histories (including risk factors) reviewed for relevance to current acute and chronic problems.  Past Medical History: Reviewed history from 06/17/2008 and no changes required. High Cholesterol Obesity Diabetes mellitus, type II Hypertension eczema muscle tear, right biceps ventral hernia left inguinal hernia repair x 3  Past Surgical History: Reviewed history from 04/23/2007 and no changes required. Hernia Surgery x3  Family History: Reviewed history from 04/23/2007 and no changes required. Family History Diabetes 1st degree relative Family History Other cancer Family History of Cardiovascular disorder Family History of Respiratory disease  Social History: Reviewed history from 11/24/2009 and no changes required. Occupation: Single Alcohol use-yes Drug use-no Retired Former Smoker     quit 2000  Physical Exam  General:  Well-developed,well-nourished,in no acute distress; alert,appropriate and cooperative throughout examination   Impression & Recommendations:  Problem # 1:  DIABETES MELLITUS, TYPE II (ICD-250.00) Assessment Deteriorated  His updated  medication list for this problem includes:    Lisinopril 40 Mg Tabs (Lisinopril) .Marland Kitchen... 2 by mouth qam    Adult Aspirin Low Strength 81 Mg Tbdp (Aspirin) ..... Once daily    Glucophage 1000 Mg Tabs (Metformin hcl) .Marland Kitchen... Take 1 tablet by mouth two times a day    Glucotrol 5 Mg Tabs (Glipizide) .Marland Kitchen... Take 1 tablet by mouth every morning  Complete Medication List: 1)  Lisinopril 40 Mg Tabs (Lisinopril) .... 2 by mouth qam 2)  Adult Aspirin Low Strength 81 Mg Tbdp (Aspirin) .... Once daily 3)  Glucophage 1000 Mg Tabs (Metformin hcl) .... Take 1 tablet by mouth two times a day 4)  Lidex 0.05 % Crea (Fluocinonide) .... Apply at bedtime 5)  Onetouch Ultra Test Strp (Glucose blood) .... Test once daily as directed 6)  Bd Ultra-fine 33 Lancets Misc (Lancets) .... Test once daily as directed by your doctor dx 250.0 7)  Epipen 2-pak 0.3 Mg/0.47ml Devi (Epinephrine) .... Uad 8)  Lutein 20 Mg Caps (Lutein) .... Once daily 9)  Vitamin C-acerola 500 Mg Chew (Ascorbic acid) .... Once daily 10)  Multivitamins Tabs (Multiple vitamin) .... Once daily 11)  Glucotrol 5 Mg Tabs (Glipizide) .... Take 1 tablet by mouth every morning  Patient Instructions: 1)  take 1000 mg of metformin prior to breakfast and 1000 mg at night foreman and the 5-mg Glucotrol tablet prior to your evening  meal. 2)  Please schedule a follow-up appointment in 3 months...250.00 3)  BMP prior to visit, ICD-9: 4)  HbgA1C prior to visit, ICD-9:   Orders Added: 1)  Est. Patient Level III [  99213] 

## 2010-10-11 NOTE — Letter (Signed)
Summary: Diabetic Eye Exam/[facility]  Diabetic Eye Exam/[facility]   Imported By: Maryln Gottron 09/20/2010 10:36:09  _____________________________________________________________________  External Attachment:    Type:   Image     Comment:   External Document

## 2010-11-15 ENCOUNTER — Encounter (HOSPITAL_COMMUNITY)
Admission: RE | Admit: 2010-11-15 | Discharge: 2010-11-15 | Disposition: A | Discharge status: Home / Self Care | Payer: Medicare Other | Source: Ambulatory Visit | Attending: General Surgery | Admitting: General Surgery

## 2010-11-15 ENCOUNTER — Other Ambulatory Visit (HOSPITAL_COMMUNITY): Payer: Self-pay | Admitting: General Surgery

## 2010-11-15 DIAGNOSIS — Z01812 Encounter for preprocedural laboratory examination: Secondary | ICD-10-CM | POA: Insufficient documentation

## 2010-11-15 DIAGNOSIS — Z01818 Encounter for other preprocedural examination: Secondary | ICD-10-CM | POA: Insufficient documentation

## 2010-11-15 DIAGNOSIS — K469 Unspecified abdominal hernia without obstruction or gangrene: Secondary | ICD-10-CM

## 2010-11-15 LAB — URINALYSIS, ROUTINE W REFLEX MICROSCOPIC
Glucose, UA: 100 mg/dL — AB
Ketones, ur: NEGATIVE mg/dL
Leukocytes, UA: NEGATIVE
Nitrite: NEGATIVE
Protein, ur: 30 mg/dL — AB
pH: 5.5 (ref 5.0–8.0)

## 2010-11-15 LAB — COMPREHENSIVE METABOLIC PANEL
AST: 50 U/L — ABNORMAL HIGH (ref 0–37)
BUN: 15 mg/dL (ref 6–23)
CO2: 31 mEq/L (ref 19–32)
Calcium: 9.6 mg/dL (ref 8.4–10.5)
Chloride: 101 mEq/L (ref 96–112)
Creatinine, Ser: 1.3 mg/dL (ref 0.4–1.5)
GFR calc Af Amer: >60 mL/min (ref >60)
GFR calc non Af Amer: 55 mL/min — ABNORMAL LOW (ref >60)
Total Bilirubin: 1 mg/dL (ref 0.3–1.2)

## 2010-11-15 LAB — CBC
Hemoglobin: 15.3 g/dL (ref 13.0–17.0)
Platelets: 235 10*3/uL (ref 150–400)
RBC: 4.5 MIL/uL (ref 4.22–5.81)
WBC: 5.3 10*3/uL (ref 4.0–10.5)

## 2010-11-15 LAB — DIFFERENTIAL
Basophils Absolute: 0.1 10*3/uL (ref 0.0–0.1)
Basophils Relative: 1 % (ref 0–1)
Eosinophils Absolute: 0.2 10*3/uL (ref 0.0–0.7)
Neutro Abs: 2.8 10*3/uL (ref 1.7–7.7)
Neutrophils Relative %: 53 % (ref 43–77)

## 2010-11-15 LAB — URINE MICROSCOPIC-ADD ON

## 2010-11-15 LAB — SURGICAL PCR SCREEN: MRSA, PCR: NEGATIVE

## 2010-11-20 ENCOUNTER — Ambulatory Visit (HOSPITAL_COMMUNITY)
Admission: RE | Admit: 2010-11-20 | Discharge: 2010-11-20 | Disposition: A | Discharge status: Home / Self Care | Payer: Medicare Other | Source: Ambulatory Visit | Attending: General Surgery | Admitting: General Surgery

## 2010-11-20 DIAGNOSIS — K4031 Unilateral inguinal hernia, with obstruction, without gangrene, recurrent: Secondary | ICD-10-CM | POA: Insufficient documentation

## 2010-11-20 DIAGNOSIS — I10 Essential (primary) hypertension: Secondary | ICD-10-CM | POA: Insufficient documentation

## 2010-11-20 DIAGNOSIS — E669 Obesity, unspecified: Secondary | ICD-10-CM | POA: Insufficient documentation

## 2010-11-20 DIAGNOSIS — K432 Incisional hernia without obstruction or gangrene: Secondary | ICD-10-CM | POA: Insufficient documentation

## 2010-11-20 DIAGNOSIS — E119 Type 2 diabetes mellitus without complications: Secondary | ICD-10-CM | POA: Insufficient documentation

## 2010-11-20 LAB — GLUCOSE, CAPILLARY
Glucose-Capillary: 216 mg/dL — ABNORMAL HIGH (ref 70–99)
Glucose-Capillary: 234 mg/dL — ABNORMAL HIGH (ref 70–99)

## 2010-11-21 NOTE — Op Note (Signed)
NAME:  Charles [name], Charles [name] NO.:  [id number]  MEDICAL RECORD NO.:  [id number]           PATIENT TYPE:  O  LOCATION:  [facility]                         FACILITY:  [facility]  PHYSICIAN:  Angelia Mould. Derrell Lolling, M.D.DATE OF BIRTH:  23-May-1941  DATE OF PROCEDURE:  11/20/2010 DATE OF DISCHARGE:  11/20/2010                              OPERATIVE REPORT   PREOPERATIVE DIAGNOSIS:  Recurrent right inguinal hernia.  POSTOPERATIVE DIAGNOSIS:  Recurrent, incarcerated right inguinal hernia  OPERATION PERFORMED:  Repair of recurrent, incarcerated right inguinal hernia with mesh Armanda Heritage repair).  SURGEON:  Angelia Mould. Derrell Lolling, MD  FIRST ASSISTANT:  Wilmon Arms. Tsuei, MD  OPERATIVE INDICATIONS:  This is a 70 year old Caucasian gentleman with hypertension, non-insulin-dependent diabetes, and mild-to-moderate obesity.  He has had multiple abdominal operations and multiple inguinal hernia repairs.  He had a left inguinal hernia repair many years ago in [place].  He developed bilateral inguinal hernias and I performed laparoscopic repair of bilateral inguinal hernias with mesh being placed in the preperitoneal space in 1999.  He developed a recurrent left inguinal hernia about a year later and I did an open repair of his recurrent left inguinal hernia with mesh.  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  In the year 2000, he had a left nephrectomy, at the same time I had to repair his recurrent left inguinal hernia with a piece of mesh anteriorly and also placing a piece of mesh in the preperitoneal space through a laparotomy incision and that repair has done well.  He has sigmoid colon incarcerated in that.  He has now developed an asymptomatic incisional hernia in the subxiphoid area, which we have decided to leave alone, but he has a symptomatic recurrent right inguinal hernia, which he has to push back in some times when he is driving his car.  He has never had intestinal obstruction from  this, but has had episodes of transient incarceration.. Because he is having to manually reduce this partly, we decided to go ahead and attempt a repair of this.  He was counseled as an outpatient and is brought to the operating room electively.  He was told that he may or may not lose his testicle as a result of this procedure and that he was at increased risk for recurrence regardless.  OPERATIVE FINDINGS:  The patient had an incarcerated indirect right inguinal hernia as well as an incarcerated direct right inguinal hernia. There was no strangulation or necrosis or infection.  The indirect component appeared to be a sliding component and once this was dissected away from the cord structures, we simply reduced this and then tightened up the ring with interrupted sutures prior to mesh placement.   OPERATIVE TECHNIQUE:  Following the induction of general endotracheal anesthesia, the patient's abdomen and genitalia were prepped and draped in sterile fashion.  Intravenous antibiotics were given.  The patient was identified as correct patient, correct procedure, and correct site. Marcaine 0.25% with epinephrine was used as a local infiltration anesthetic in the subcutaneous tissue and the muscle layers.  A total of 20 mL of Marcaine was used.  A transverse  oblique incision was made in the right groin overlying the inguinal canal.  Dissection was carried down through the fairly thick adipose and abdominal wall layers.  We ultimately exposed the cord structures and could identify the external inguinal ring.  We very slowly and tediously dissected all of the soft tissue away and we then encircled the cord structures with a Penrose drain.  I inserted my finger into the external inguinal ring and then incised the external oblique muscle in the direction of its fibers laterally and opened this up all the way out, ultimately to the level of the anterior superior iliac spine.  We did spend a fair  amount of time dissecting the cord structures away from the indirect hernia sac.  There were multiple layers we had to skeletonize and push the cremasteric muscle fibers away.  Once I had this completely dissected out, we actually entered the sac and found this was more of a sliding hernia.  We then closed this down and simply reduced the indirect sac and then lateral to the cord structures we closed the internal ring with interrupted sutures of 0- Novafil.  Medial to the cord structures, there was a small incarcerated direct hernia.  This was dissected away from the surrounding tissues and debrided and reduced.  I then closed the fascia over the medial defect with interrupted sutures of 0-Novafil.  By this point in time, I had the symphysis pubis and the pubic tubercle to where I could easily palpate them and I had identified and exposed the inguinal ligament all the way from the pubic tubercle well lateral and almost all the way to the anterior superior iliac spine.  I brought a 6 inch x 6 inch piece of Ultrapro mesh to the operative field.  This was trimmed down in size to accommodate the wound.  Medially I placed a few interrupted mattress sutures of 2-0 Prolene to secure this in place. Along the inguinal ligament, I did two separate running sutures of 2-0 Prolene until I was probably about 2 inches lateral to the internal ring.  Superiorly and superolaterally, I placed multiple interrupted mattress sutures of 2-0 Prolene.  The mesh was incised laterally so as to wrap around the cord structures at the internal ring.  The tails of the mesh were overlapped laterally and a few other sutures were placed to hold this in place.  The tails of the mesh were then tucked under the external oblique almost to the level of the anterior superior iliac spine.  I placed a few more sutures of 2-0 Prolene to tighten the mesh around the internal ring.  This provided a very secure repair both medial and  well lateral to the internal ring, but I allowed fingertip opening for the cord structures.  There was no bleeding.  Everything looked good.  I irrigated the wound.  The external oblique was closed with a running suture of 2-0 Vicryl.  Scarpa fascia was closed with a running suture of 2-0 Vicryl and the skin closed with a running suture of 4-0 Monocryl and Dermabond.  Clean bandages were placed and the patient was taken to the recovery room in a stable condition.  Estimated blood loss was about 20 mL.  Complications were none.  Sponge, needle, and instrument counts were correct.     Angelia Mould. Derrell Lolling, M.D.     HMI/MEDQ  D:  11/20/2010  T:  11/21/2010  Job:  [id number]  cc:   Tinnie Gens A. Tawanna Cooler, MD  Electronically  Signed by Claud Kelp M.D. on 11/21/2010 09:41:52 AM

## 2010-11-22 ENCOUNTER — Encounter: Payer: Self-pay | Admitting: Internal Medicine

## 2010-11-27 NOTE — Letter (Signed)
Summary: Colonoscopy Letter  [facility]  [address]   [place], [place] [address]   Phone: [phone]  Fax: [phone]      November 22, 2010 MRN: [id number]   Charles [name] [address] [place], [place]  [address]   Dear Charles [name],   According to your medical record, it is time for you to schedule a Colonoscopy. The American Cancer Society recommends this procedure as a method to detect early colon cancer. Patients with a family history of colon cancer, or a personal history of colon polyps or inflammatory bowel disease are at increased risk.  This letter has been generated based on the recommendations made at the time of your procedure. If you feel that in your particular situation this may no longer apply, please contact our office.  Please call our office at [phone] to schedule this appointment or to update your records at your earliest convenience.  Thank you for cooperating with [place] to provide you with the very best care possible.   Sincerely,   Stan Head, M.D.  [facility] [phone]

## 2010-11-30 ENCOUNTER — Other Ambulatory Visit: Payer: Self-pay | Admitting: *Deleted

## 2010-11-30 MED ORDER — GLUCOSE BLOOD VI STRP: ORAL_STRIP | Status: DC

## 2010-12-10 ENCOUNTER — Other Ambulatory Visit (INDEPENDENT_AMBULATORY_CARE_PROVIDER_SITE_OTHER): Payer: Medicare Other | Admitting: Family Medicine

## 2010-12-10 DIAGNOSIS — Z0000 Encounter for general adult medical examination without abnormal findings: Secondary | ICD-10-CM

## 2010-12-10 DIAGNOSIS — E119 Type 2 diabetes mellitus without complications: Secondary | ICD-10-CM

## 2010-12-10 DIAGNOSIS — Z125 Encounter for screening for malignant neoplasm of prostate: Secondary | ICD-10-CM

## 2010-12-10 DIAGNOSIS — I10 Essential (primary) hypertension: Secondary | ICD-10-CM

## 2010-12-10 DIAGNOSIS — E663 Overweight: Secondary | ICD-10-CM

## 2010-12-10 LAB — CBC WITH DIFFERENTIAL/PLATELET
Basophils Absolute: 0.1 10*3/uL (ref 0.0–0.1)
Eosinophils Absolute: 0.6 10*3/uL (ref 0.0–0.7)
Eosinophils Relative: 10.1 % — ABNORMAL HIGH (ref 0.0–5.0)
HCT: 42.6 % (ref 39.0–52.0)
Lymphs Abs: 1.4 10*3/uL (ref 0.7–4.0)
MCHC: 34 g/dL (ref 30.0–36.0)
MCV: 103.6 fl — ABNORMAL HIGH (ref 78.0–100.0)
Monocytes Absolute: 0.5 10*3/uL (ref 0.1–1.0)
Neutrophils Relative %: 53.6 % (ref 43.0–77.0)
Platelets: 277 10*3/uL (ref 150.0–400.0)
RDW: 14.7 % — ABNORMAL HIGH (ref 11.5–14.6)

## 2010-12-10 LAB — BASIC METABOLIC PANEL
BUN: 15 mg/dL (ref 6–23)
CO2: 28 mEq/L (ref 19–32)
Chloride: 98 mEq/L (ref 96–112)
Creatinine, Ser: 1 mg/dL (ref 0.4–1.5)
Glucose, Bld: 161 mg/dL — ABNORMAL HIGH (ref 70–99)
Potassium: 4.6 mEq/L (ref 3.5–5.1)

## 2010-12-10 LAB — LIPID PANEL
Cholesterol: 201 mg/dL — ABNORMAL HIGH (ref 0–200)
HDL: 40.2 mg/dL (ref >39.00)
Total CHOL/HDL Ratio: 5
Triglycerides: 214 mg/dL — ABNORMAL HIGH (ref 0.0–149.0)

## 2010-12-10 LAB — TSH: TSH: 1.45 u[IU]/mL (ref 0.35–5.50)

## 2010-12-10 LAB — POCT URINALYSIS DIPSTICK
Blood, UA: NEGATIVE
Glucose, UA: NEGATIVE
Nitrite, UA: NEGATIVE
Spec Grav, UA: 1.025
Urobilinogen, UA: 0.2

## 2010-12-10 LAB — MICROALBUMIN / CREATININE URINE RATIO
Creatinine,U: 194.3 mg/dL
Microalb, Ur: 22.2 mg/dL — ABNORMAL HIGH (ref 0.0–1.9)

## 2010-12-10 LAB — PSA: PSA: 1.08 ng/mL (ref 0.10–4.00)

## 2010-12-10 LAB — HEPATIC FUNCTION PANEL
ALT: 47 U/L (ref 0–53)
Total Bilirubin: 0.5 mg/dL (ref 0.3–1.2)

## 2010-12-10 LAB — LDL CHOLESTEROL, DIRECT: Direct LDL: 131.5 mg/dL

## 2010-12-13 ENCOUNTER — Encounter: Payer: Self-pay | Admitting: Family Medicine

## 2010-12-17 ENCOUNTER — Ambulatory Visit (INDEPENDENT_AMBULATORY_CARE_PROVIDER_SITE_OTHER): Payer: Medicare Other | Admitting: Family Medicine

## 2010-12-17 ENCOUNTER — Encounter: Payer: Self-pay | Admitting: Family Medicine

## 2010-12-17 DIAGNOSIS — E119 Type 2 diabetes mellitus without complications: Secondary | ICD-10-CM

## 2010-12-17 DIAGNOSIS — L259 Unspecified contact dermatitis, unspecified cause: Secondary | ICD-10-CM

## 2010-12-17 DIAGNOSIS — I10 Essential (primary) hypertension: Secondary | ICD-10-CM

## 2010-12-17 DIAGNOSIS — E663 Overweight: Secondary | ICD-10-CM

## 2010-12-17 DIAGNOSIS — L239 Allergic contact dermatitis, unspecified cause: Secondary | ICD-10-CM

## 2010-12-17 LAB — POCT I-STAT, CHEM 8
BUN: 26 mg/dL — ABNORMAL HIGH (ref 6–23)
Creatinine, Ser: 1.5 mg/dL (ref 0.4–1.5)
Glucose, Bld: 211 mg/dL — ABNORMAL HIGH (ref 70–99)
Hemoglobin: 17.7 g/dL — ABNORMAL HIGH (ref 13.0–17.0)
Potassium: 4.2 mEq/L (ref 3.5–5.1)

## 2010-12-17 LAB — POCT CARDIAC MARKERS: CKMB, poc: 1.6 ng/mL (ref 1.0–8.0)

## 2010-12-17 MED ORDER — GLUCOSE BLOOD VI STRP: ORAL_STRIP | Status: DC

## 2010-12-17 MED ORDER — GLIPIZIDE 5 MG PO TABS: ORAL_TABLET | ORAL | Status: DC

## 2010-12-17 MED ORDER — METFORMIN HCL 1000 MG PO TABS: 1000.0000 mg | ORAL_TABLET | Freq: Every day | ORAL | Status: DC

## 2010-12-17 MED ORDER — LISINOPRIL 40 MG PO TABS: 40.0000 mg | ORAL_TABLET | Freq: Every day | ORAL | Status: DC

## 2010-12-17 MED ORDER — FLUOCINONIDE 0.05 % EX CREA: TOPICAL_CREAM | Freq: Every day | CUTANEOUS | Status: DC

## 2010-12-17 NOTE — Patient Instructions (Signed)
Increase the Glucotrol to 5 mg twice daily.  Check a fasting blood sugar daily.  Continue the Glucophage 1000 mg twice daily.  Follow-up BMP and A1c in 3 months.  Continue diet and exercise program.  Consider the shingles.  Vaccination

## 2010-12-17 NOTE — Progress Notes (Signed)
  Subjective:    Patient ID: KALLON CAYLOR, male    DOB: 04-22-1941, 70 y.o.   MRN: [id number]  Arts is a 70 year old, married man, nonsmoker, who comes in today for follow-up of diabetes, hypertension, obesity.  His weight is 250 pounds.  His blood sugar is running in the 140 range in the morning.  He says in the afternoon it drops down to 80 however, his hemoglobin A1c is shown to up to 9.0%.  We talked about his eventual need for insulin.  He wants to double his efforts at diet and exercise.  His blood pressure here is 140/96 at home is 122/84 on lisinopril 40 mg daily.    Review of Systems  Constitutional: Negative.   HENT: Negative.   Eyes: Negative.   Respiratory: Negative.   Cardiovascular: Negative.   Gastrointestinal: Negative.   Genitourinary: Negative.   Musculoskeletal: Negative.   Skin: Negative.   Neurological: Negative.   Hematological: Negative.   Psychiatric/Behavioral: Negative.        Objective:   Physical Exam  Constitutional: He is oriented to person, place, and time. He appears well-developed and well-nourished.  HENT:  Head: Normocephalic and atraumatic.  Right Ear: External ear normal.  Left Ear: External ear normal.  Nose: Nose normal.  Mouth/Throat: Oropharynx is clear and moist.  Eyes: Conjunctivae and EOM are normal. Pupils are equal, round, and reactive to light.  Neck: Normal range of motion. Neck supple. No JVD present. No tracheal deviation present. No thyromegaly present.  Cardiovascular: Normal rate, regular rhythm, normal heart sounds and intact distal pulses.  Exam reveals no gallop and no friction rub.   No murmur heard. Pulmonary/Chest: Effort normal and breath sounds normal. No stridor. No respiratory distress. He has no wheezes. He has no rales. He exhibits no tenderness.  Abdominal: Soft. Bowel sounds are normal. He exhibits no distension and no mass. There is no tenderness. There is no rebound and no guarding.  Genitourinary:  Rectum normal, prostate normal and penis normal. Guaiac negative stool. No penile tenderness.  Musculoskeletal: Normal range of motion. He exhibits no edema and no tenderness.  Lymphadenopathy:    He has no cervical adenopathy.  Neurological: He is alert and oriented to person, place, and time. He has normal reflexes. No cranial nerve deficit. He exhibits normal muscle tone.  Skin: Skin is warm and dry. No rash noted. No erythema. No pallor.       Healing scar, right groin from previous inguinal hernia repair  Psychiatric: He has a normal mood and affect. His behavior is normal. Judgment and thought content normal.          Assessment & Plan:  Diabetes type 2, under poor control recommended continue diet, exercise, increase the glipizide to 5 mg b.i.d. Follow-up A1c in 3 months.  Begin insulin if blood sugar continues to remain elevated.  Hypertension.  Continue current medications.  Obesity.  Continue diet, exercise and weight loss.  Consider shingles

## 2011-03-11 ENCOUNTER — Other Ambulatory Visit: Payer: Medicare Other

## 2011-03-18 ENCOUNTER — Ambulatory Visit: Payer: Medicare Other | Admitting: Family Medicine

## 2011-04-01 ENCOUNTER — Other Ambulatory Visit (INDEPENDENT_AMBULATORY_CARE_PROVIDER_SITE_OTHER): Payer: Medicare Other

## 2011-04-01 DIAGNOSIS — E119 Type 2 diabetes mellitus without complications: Secondary | ICD-10-CM

## 2011-04-01 LAB — BASIC METABOLIC PANEL
CO2: 30 mEq/L (ref 19–32)
Calcium: 8.7 mg/dL (ref 8.4–10.5)
Chloride: 97 mEq/L (ref 96–112)
Potassium: 4.5 mEq/L (ref 3.5–5.1)
Sodium: 137 mEq/L (ref 135–145)

## 2011-04-15 ENCOUNTER — Encounter: Payer: Self-pay | Admitting: Family Medicine

## 2011-04-15 ENCOUNTER — Ambulatory Visit (INDEPENDENT_AMBULATORY_CARE_PROVIDER_SITE_OTHER): Payer: Medicare Other | Admitting: Family Medicine

## 2011-04-15 VITALS — BP 110/80 | Temp 98.3°F | Wt 248.0 lb

## 2011-04-15 DIAGNOSIS — E119 Type 2 diabetes mellitus without complications: Secondary | ICD-10-CM

## 2011-04-15 NOTE — Patient Instructions (Signed)
Continue the 5 mg of glipizide in 1000 mg of metformin prior to breakfast...................... add 500 mg of metformin prior to evening meal.  Continue diet and exercise and weight loss program.  Follow-up in 3 months, sooner if any problems.  Fasting labs one week prior

## 2011-04-15 NOTE — Progress Notes (Signed)
  Subjective:    Patient ID: Charles [name], male    DOB: [birth year], 70 y.o.   MRN: [id number]  HPI  Charles [name] is a 70 year old male, who comes in today for follow-up of type 2 diabetes.  He should be taking 5 mg of glipizide b.i.d. And 1000 mg of metformin b.i.d. However, he is only taking 5 mg of glipizide, and 1000 mg of metformin in the morning.  He states he can't remember to take his evening medication.  His A1c was 9.  Now is down to 7.7, with increased exercise, and paying more attention to his diet.   Review of Systems    General and metabolic review of systems otherwise negative Objective:   Physical Exam Well-developed well-nourished, male in no acute distress       Assessment & Plan:  Diabetes type II non-ago.  Plan add 500 mg of metformin prior to evening meal.  Follow-up labs and office visit in 3 months

## 2011-04-23 ENCOUNTER — Encounter: Payer: Self-pay | Admitting: Family Medicine

## 2011-04-23 ENCOUNTER — Ambulatory Visit (INDEPENDENT_AMBULATORY_CARE_PROVIDER_SITE_OTHER): Payer: Medicare Other | Admitting: Family Medicine

## 2011-04-23 DIAGNOSIS — L570 Actinic keratosis: Secondary | ICD-10-CM

## 2011-04-23 DIAGNOSIS — L909 Atrophic disorder of skin, unspecified: Secondary | ICD-10-CM

## 2011-04-23 DIAGNOSIS — L989 Disorder of the skin and subcutaneous tissue, unspecified: Secondary | ICD-10-CM

## 2011-04-23 NOTE — Progress Notes (Signed)
  Subjective:    Patient ID: Charles [name], male    DOB: June 30, 1941, 70 y.o.   MRN: [id number]  HPI Charles [name] is a 70 year old male, who comes in today for removal of a red, irritated lesion on his face in front of his right ear.  It is been present now for about a year.  After informed consent, the lesion was anesthetized with 1% Xylocaine with epinephrine.  It was excised in toto with 3-mm margins and sent for pathologic analysis.  The base was cauterized bandage was applied.  He tolerated the procedure with no complications.  Also 5 skin tags were removed.    Review of Systems    General review of systems negative Objective:   Physical Exam  Well-developed and nourished, male in no acute distress.  Procedure see above      Assessment & Plan:  Red irritated lesion right face.  Questions skin cancer sent for analysis.  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

## 2011-04-23 NOTE — Patient Instructions (Signed)
If we do not call you within two weeks with report then call me

## 2011-05-23 ENCOUNTER — Other Ambulatory Visit: Payer: Self-pay | Admitting: *Deleted

## 2011-05-23 DIAGNOSIS — I10 Essential (primary) hypertension: Secondary | ICD-10-CM

## 2011-05-23 MED ORDER — LISINOPRIL 40 MG PO TABS: 40.0000 mg | ORAL_TABLET | Freq: Every day | ORAL | Status: DC

## 2011-07-18 ENCOUNTER — Encounter: Payer: Self-pay | Admitting: Family Medicine

## 2011-07-18 ENCOUNTER — Ambulatory Visit (INDEPENDENT_AMBULATORY_CARE_PROVIDER_SITE_OTHER): Payer: Medicare Other | Admitting: Family Medicine

## 2011-07-18 VITALS — BP 110/80 | Temp 98.5°F | Wt 250.0 lb

## 2011-07-18 DIAGNOSIS — Z23 Encounter for immunization: Secondary | ICD-10-CM

## 2011-07-18 DIAGNOSIS — H109 Unspecified conjunctivitis: Secondary | ICD-10-CM

## 2011-07-18 MED ORDER — GENTAMICIN FORTIFIED OPHTHALMIC SOLUTION: 1.0000 [drp] | Freq: Four times a day (QID) | OPHTHALMIC | Status: DC

## 2011-07-18 NOTE — Progress Notes (Signed)
  Subjective:    Patient ID: Charles [name], male    DOB: 10-Mar-1941, 70 y.o.   MRN: [id number]  HPI Charles [name] is a 70 year old male, who comes in today for evaluation of redness and pus in his right eye for two days.  He had no antecedent symptoms.  No history of trauma.........Marland Kitchennever had  Iritis.  Vision normal.  No pain   Review of Systems General and I reviewed his systems otherwise negative    Objective:   Physical Exam Well-developed well-nourished man in acute distress.  Visual acuity, right eye, 2025, left eye, 2025 with glasses.  He does have erythema and passed through ophthalmologic exam shows no encroachment of the erythema on the cornea.       Assessment & Plan:  Bacterial versus viral conjunctivitis.  Plan eye drops.  Return p.r.n.  Ophthalmologic consult if symptoms persist

## 2011-07-18 NOTE — Patient Instructions (Signed)
Warm soaks twice daily.  One drop in the right eye 4 times daily.  If the symptoms persist or he develop any difficulty with her vision.  Call your ophthalmologist immediately

## 2011-07-22 ENCOUNTER — Other Ambulatory Visit: Payer: Medicare Other

## 2011-07-22 ENCOUNTER — Other Ambulatory Visit (INDEPENDENT_AMBULATORY_CARE_PROVIDER_SITE_OTHER): Payer: Medicare Other

## 2011-07-22 DIAGNOSIS — E119 Type 2 diabetes mellitus without complications: Secondary | ICD-10-CM

## 2011-07-23 LAB — BASIC METABOLIC PANEL
CO2: 29 mEq/L (ref 19–32)
Calcium: 9 mg/dL (ref 8.4–10.5)
Potassium: 4.9 mEq/L (ref 3.5–5.1)
Sodium: 141 mEq/L (ref 135–145)

## 2011-07-23 LAB — HEMOGLOBIN A1C: Hgb A1c MFr Bld: 7.6 % — ABNORMAL HIGH (ref 4.6–6.5)

## 2011-07-29 ENCOUNTER — Ambulatory Visit (INDEPENDENT_AMBULATORY_CARE_PROVIDER_SITE_OTHER): Payer: Medicare Other | Admitting: Family Medicine

## 2011-07-29 ENCOUNTER — Encounter: Payer: Self-pay | Admitting: Family Medicine

## 2011-07-29 ENCOUNTER — Telehealth: Payer: Self-pay | Admitting: Family Medicine

## 2011-07-29 DIAGNOSIS — E119 Type 2 diabetes mellitus without complications: Secondary | ICD-10-CM

## 2011-07-29 DIAGNOSIS — E663 Overweight: Secondary | ICD-10-CM

## 2011-07-29 MED ORDER — METFORMIN HCL 1000 MG PO TABS: ORAL_TABLET | ORAL | Status: DC

## 2011-07-29 NOTE — Telephone Encounter (Signed)
Pt req to get the labs that were ordered, HGA1C and BMP, done at [facility]. Pt req to go to [facility] office the wk of 10/28/11 to get labs drawn. Pls make sure order is in epic.

## 2011-07-29 NOTE — Patient Instructions (Signed)
Continue the glipizide and metformin prior to breakfast, and a 500-mg dose of metformin prior to evening meal.  Block 30 minutes daily.  Tighten up on your diet.  Follow-up in 3 months with labs one week prior

## 2011-07-29 NOTE — Progress Notes (Signed)
  Subjective:    Patient ID: Charles [name], male    DOB: 1941-05-03, 70 y.o.   MRN: [id number]  HPI Charles [name] is a 70 year old male, nonsmoker comes in today for follow-up of diabetes, type II.  He taking 5 mg of glipizide in the morning, along with 1000 mg, metformin.  Blood sugar are in the 14160 range, and he wants a still high, 7.2%.  He states he walks 30 minutes 3 times per week and is not following a diet.   Review of Systems    General and metabolic review of systems otherwise negative Objective:   Physical Exam  Well-developed well-nourished, male in no acute distress      Assessment & Plan:  Diabetes type 2, not at goal.  Plan......... Walk 30 minutes daily, and increased metformin by adding a 500-mg dose prior to evening meal.  Return in 3 months for follow-up

## 2011-07-29 NOTE — Telephone Encounter (Signed)
Orders in chart.

## 2011-10-09 ENCOUNTER — Telehealth: Payer: Self-pay | Admitting: *Deleted

## 2011-10-09 NOTE — Telephone Encounter (Signed)
Patient would like to get his lab work for his physical at the [facility] if possible?

## 2011-10-10 NOTE — Telephone Encounter (Signed)
Left message on machine for patient

## 2011-10-10 NOTE — Telephone Encounter (Signed)
No............ explained to him that we've had prominent the lung labs they lose stuff they don't get right we want his lab work done here,,,,,,,,,, he can come in nonfasting 11 AM or 3 to 4 in the afternoon

## 2011-10-15 ENCOUNTER — Other Ambulatory Visit (INDEPENDENT_AMBULATORY_CARE_PROVIDER_SITE_OTHER): Payer: Medicare Other

## 2011-10-15 DIAGNOSIS — E119 Type 2 diabetes mellitus without complications: Secondary | ICD-10-CM

## 2011-10-15 LAB — BASIC METABOLIC PANEL
BUN: 20 mg/dL (ref 6–23)
CO2: 30 mEq/L (ref 19–32)
Chloride: 102 mEq/L (ref 96–112)
Creatinine, Ser: 1.3 mg/dL (ref 0.4–1.5)

## 2011-10-29 ENCOUNTER — Other Ambulatory Visit: Payer: Medicare Other

## 2011-11-04 ENCOUNTER — Encounter: Payer: Self-pay | Admitting: Family Medicine

## 2011-11-04 ENCOUNTER — Ambulatory Visit (INDEPENDENT_AMBULATORY_CARE_PROVIDER_SITE_OTHER): Payer: Medicare Other | Admitting: Family Medicine

## 2011-11-04 VITALS — BP 120/84 | Temp 98.2°F | Wt 251.0 lb

## 2011-11-04 DIAGNOSIS — E119 Type 2 diabetes mellitus without complications: Secondary | ICD-10-CM

## 2011-11-04 MED ORDER — BD ULTRA-FINE LANCETS MISC: 1.0000 | Freq: Every day | Status: DC

## 2011-11-04 NOTE — Patient Instructions (Signed)
Increase the glipizide to 5 mg twice daily  Increase the metformin by adding a 500 mg tablet prior to your evening meal  Set up a time in April for your annual exam nonfasting labs one week prior  Continue daily walking

## 2011-11-04 NOTE — Progress Notes (Signed)
  Subjective:    Patient ID: Charles [name], male    DOB: 08-26-1941, 71 y.o.   MRN: [id number]  HPI Charles [name] is a 71 year old male who comes in today for followup of diabetes type 2  He is supposed to be taking glipizide 5 mg twice a day but is only taking it once daily he is also post to be taking metformin 1000 mg in the morning and 500 prior to his evening meal however he is only taking the 1 dose in the morning. Recent fasting blood sugar 179 A1c up to 8  He has difficulty being compliant with the diet  However he does walk 2 miles daily   Review of Systems General and metabolic review of systems otherwise negative    Objective:   Physical Exam Well-developed well-nourished male in no acute distress       Assessment & Plan:  Diabetes type 2 not at goal plan increase glipizide to 5 mg twice a day increase metformin to add a 500 mg tablet prior to evening and as we previously outlined!!!!!!!!!!!!!! return in 3 months for followup try to follow a diet continue the 2 miles of walking daily

## 2011-11-04 NOTE — Progress Notes (Signed)
Addended by: Kern Reap B on: 11/04/2011 02:59 PM   Modules accepted: Orders

## 2011-12-16 ENCOUNTER — Telehealth: Payer: Self-pay | Admitting: Family Medicine

## 2011-12-16 DIAGNOSIS — E119 Type 2 diabetes mellitus without complications: Secondary | ICD-10-CM

## 2011-12-16 MED ORDER — GLUCOSE BLOOD VI STRP: ORAL_STRIP | Status: DC

## 2011-12-16 NOTE — Telephone Encounter (Signed)
Pt requesting refill on glucose blood (ONE TOUCH TEST STRIPS) test strip

## 2011-12-17 ENCOUNTER — Other Ambulatory Visit: Payer: Medicare Other

## 2011-12-24 ENCOUNTER — Encounter: Payer: Medicare Other | Admitting: Family Medicine

## 2012-02-13 ENCOUNTER — Other Ambulatory Visit (INDEPENDENT_AMBULATORY_CARE_PROVIDER_SITE_OTHER): Payer: Medicare Other

## 2012-02-13 ENCOUNTER — Other Ambulatory Visit: Payer: Self-pay | Admitting: *Deleted

## 2012-02-13 DIAGNOSIS — Z79899 Other long term (current) drug therapy: Secondary | ICD-10-CM

## 2012-02-13 DIAGNOSIS — E119 Type 2 diabetes mellitus without complications: Secondary | ICD-10-CM

## 2012-02-13 DIAGNOSIS — Z125 Encounter for screening for malignant neoplasm of prostate: Secondary | ICD-10-CM

## 2012-02-13 DIAGNOSIS — Z0000 Encounter for general adult medical examination without abnormal findings: Secondary | ICD-10-CM

## 2012-02-13 LAB — BASIC METABOLIC PANEL
CO2: 30 mEq/L (ref 19–32)
Chloride: 103 mEq/L (ref 96–112)
Creatinine, Ser: 1.3 mg/dL (ref 0.4–1.5)
Glucose, Bld: 161 mg/dL — ABNORMAL HIGH (ref 70–99)

## 2012-02-13 LAB — POCT URINALYSIS DIPSTICK
Glucose, UA: NEGATIVE
Leukocytes, UA: NEGATIVE
Nitrite, UA: NEGATIVE
Urobilinogen, UA: 1

## 2012-02-13 LAB — PSA: PSA: 1.24 ng/mL (ref 0.10–4.00)

## 2012-02-13 LAB — CBC WITH DIFFERENTIAL/PLATELET
Basophils Relative: 0.5 % (ref 0.0–3.0)
Eosinophils Absolute: 0.3 10*3/uL (ref 0.0–0.7)
Hemoglobin: 13.8 g/dL (ref 13.0–17.0)
MCHC: 33.1 g/dL (ref 30.0–36.0)
MCV: 105.2 fl — ABNORMAL HIGH (ref 78.0–100.0)
Monocytes Absolute: 0.6 10*3/uL (ref 0.1–1.0)
Neutro Abs: 4.1 10*3/uL (ref 1.4–7.7)
Neutrophils Relative %: 66.2 % (ref 43.0–77.0)
RBC: 3.95 Mil/uL — ABNORMAL LOW (ref 4.22–5.81)

## 2012-02-13 LAB — HEPATIC FUNCTION PANEL
Albumin: 3.7 g/dL (ref 3.5–5.2)
Total Protein: 6.3 g/dL (ref 6.0–8.3)

## 2012-02-13 LAB — LIPID PANEL
Total CHOL/HDL Ratio: 5
VLDL: 70.4 mg/dL — ABNORMAL HIGH (ref 0.0–40.0)

## 2012-02-13 MED ORDER — GLIPIZIDE 5 MG PO TABS: ORAL_TABLET | ORAL | Status: DC

## 2012-02-14 LAB — MICROALBUMIN / CREATININE URINE RATIO: Microalb, Ur: 2.6 mg/dL — ABNORMAL HIGH (ref 0.0–1.9)

## 2012-02-20 ENCOUNTER — Ambulatory Visit (INDEPENDENT_AMBULATORY_CARE_PROVIDER_SITE_OTHER): Payer: Medicare Other | Admitting: Family Medicine

## 2012-02-20 ENCOUNTER — Telehealth: Payer: Self-pay | Admitting: Family Medicine

## 2012-02-20 ENCOUNTER — Encounter: Payer: Self-pay | Admitting: Family Medicine

## 2012-02-20 VITALS — BP 110/86 | Temp 98.1°F | Ht 71.0 in | Wt 251.0 lb

## 2012-02-20 DIAGNOSIS — L259 Unspecified contact dermatitis, unspecified cause: Secondary | ICD-10-CM

## 2012-02-20 DIAGNOSIS — E785 Hyperlipidemia, unspecified: Secondary | ICD-10-CM

## 2012-02-20 DIAGNOSIS — L239 Allergic contact dermatitis, unspecified cause: Secondary | ICD-10-CM

## 2012-02-20 DIAGNOSIS — E119 Type 2 diabetes mellitus without complications: Secondary | ICD-10-CM

## 2012-02-20 DIAGNOSIS — E663 Overweight: Secondary | ICD-10-CM

## 2012-02-20 DIAGNOSIS — H6120 Impacted cerumen, unspecified ear: Secondary | ICD-10-CM

## 2012-02-20 DIAGNOSIS — I10 Essential (primary) hypertension: Secondary | ICD-10-CM

## 2012-02-20 DIAGNOSIS — Z23 Encounter for immunization: Secondary | ICD-10-CM

## 2012-02-20 DIAGNOSIS — Z0000 Encounter for general adult medical examination without abnormal findings: Secondary | ICD-10-CM

## 2012-02-20 MED ORDER — FLUOCINONIDE 0.05 % EX CREA: TOPICAL_CREAM | Freq: Every day | CUTANEOUS | Status: DC

## 2012-02-20 MED ORDER — METFORMIN HCL 1000 MG PO TABS: ORAL_TABLET | ORAL | Status: DC

## 2012-02-20 MED ORDER — GLUCOSE BLOOD VI STRP: ORAL_STRIP | Status: DC

## 2012-02-20 MED ORDER — BD ULTRA-FINE LANCETS MISC: 1.0000 | Freq: Every day | Status: DC

## 2012-02-20 MED ORDER — GLIPIZIDE 10 MG PO TABS: ORAL_TABLET | ORAL | Status: DC

## 2012-02-20 MED ORDER — LISINOPRIL 40 MG PO TABS: 40.0000 mg | ORAL_TABLET | Freq: Every day | ORAL | Status: DC

## 2012-02-20 NOTE — Telephone Encounter (Signed)
[facility] pharmacy called re: BD ULTRA-FINE LANCETS lancets. Need clarification. Wants to know if this should be pen needles?

## 2012-02-20 NOTE — Patient Instructions (Signed)
Increase the metformin to 1000 mg before breakfast and 1000 mg prior to your evening meal  Increase the glipizide to 10 mg twice daily  Walk daily  Avoid carbohydrates  We will get you set for a consult with Tollie Eth at [facility] endocrinology to help improve your diabetes control  Followup with me in 3 months  Labs one week prior

## 2012-02-20 NOTE — Progress Notes (Signed)
  Subjective:    Patient ID: Charles [name], male    DOB: 11-Sep-1940, 71 y.o.   MRN: [id number]  HPI Venus is a 71 year old single male nonsmoker who comes in today for a Medicare wellness examination because of diabetes, hypertension, obesity, and a skin rash  He's currently taking metformin 1000 mg twice a day and Glucotrol 5 mg twice a day fasting blood sugars in the 150-200 range and A1c now up to 8.0%. He states he takes his medication but cannot follow a diet nor does he exercise on a regular basis. I explained to him double up his medication but he really needs to get on his diet and exercise program. I will refer him to Tollie Eth for diabetic consult  He takes lisinopril 40 mg daily BP 110/86  His eczema of his left foot would like some steroid ointment  He gets routine eye care, dental care, colonoscopy, tetanus 2003, Pneumovax x2, information given on shingles  Cognitive function normal he walks but not on a daily basis home health safety reviewed no issues identified, no guns in the house, he does have a health care power of attorney and living well   Review of Systems  Constitutional: Negative.   HENT: Negative.   Eyes: Negative.   Respiratory: Negative.   Cardiovascular: Negative.   Gastrointestinal: Negative.   Genitourinary: Negative.   Musculoskeletal: Negative.   Skin: Negative.   Neurological: Negative.   Hematological: Negative.   Psychiatric/Behavioral: Negative.        Objective:   Physical Exam  Constitutional: He is oriented to person, place, and time. He appears well-developed and well-nourished.  HENT:  Head: Normocephalic and atraumatic.  Right Ear: External ear normal.  Left Ear: External ear normal.  Nose: Nose normal.  Mouth/Throat: Oropharynx is clear and moist.  Eyes: Conjunctivae and EOM are normal. Pupils are equal, round, and reactive to light.  Neck: Normal range of motion. Neck supple. No JVD present. No tracheal deviation present. No  thyromegaly present.  Cardiovascular: Normal rate, regular rhythm, normal heart sounds and intact distal pulses.  Exam reveals no gallop and no friction rub.   No murmur heard. Pulmonary/Chest: Effort normal and breath sounds normal. No stridor. No respiratory distress. He has no wheezes. He has no rales. He exhibits no tenderness.  Abdominal: Soft. Bowel sounds are normal. He exhibits no distension and no mass. There is no tenderness. There is no rebound and no guarding.       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 Guaiac negative stool. No penile tenderness.  Musculoskeletal: Normal range of motion. He exhibits no edema and no tenderness.  Lymphadenopathy:    He has no cervical adenopathy.  Neurological: He is alert and oriented to person, place, and time. He has normal reflexes. No cranial nerve deficit. He exhibits normal muscle tone.  Skin: Skin is warm and dry. No rash noted. No erythema. No pallor.  Psychiatric: He has a normal mood and affect. His behavior is normal. Judgment and thought content normal.          Assessment & Plan:  Healthy male  Diabetes type 2 not at goal plan diet exercise and decrease medication consult with Tollie Eth  Hypertension continue lisinopril 40 mg daily and an aspirin tablet  Eczema steroid cream twice a day

## 2012-02-21 NOTE — Telephone Encounter (Signed)
Left message on machine for patient to return our call 

## 2012-02-24 NOTE — Telephone Encounter (Signed)
Spoke with patient and he does not need diabetes supplies at this time.

## 2012-03-03 ENCOUNTER — Telehealth: Payer: Self-pay | Admitting: Family Medicine

## 2012-03-03 DIAGNOSIS — E119 Type 2 diabetes mellitus without complications: Secondary | ICD-10-CM

## 2012-03-03 MED ORDER — GLUCOSE BLOOD VI STRP: 1.0000 | ORAL_STRIP | Freq: Every day | Status: DC

## 2012-03-03 NOTE — Telephone Encounter (Signed)
Rx sent 

## 2012-03-03 NOTE — Telephone Encounter (Signed)
Caller: Donnie (pharmacist) [facility]; PCP: Roderick Pee.; CB#: [phone]; Calling today 03/03/12 regarding prescription for Lancets and test strips written by Dr. Tawanna Cooler.  Can't get a call back on this, he needs an original prescription e-scribed with dx code and specific directions regarding testing frequency.  PHARMACIST CAN'T TAKE A VERBAL, MUST BE E-SCRIBED.  MAKE SURE TO INCLUDE DX CODE AND SPECIFIC INSTRUCTIONS FOR TESTING FREQUENCY.  PLEASE HANDLE ASAP.

## 2012-05-05 ENCOUNTER — Other Ambulatory Visit: Payer: Self-pay | Admitting: Family Medicine

## 2012-05-05 ENCOUNTER — Telehealth: Payer: Self-pay | Admitting: Family Medicine

## 2012-05-05 DIAGNOSIS — E119 Type 2 diabetes mellitus without complications: Secondary | ICD-10-CM

## 2012-05-05 DIAGNOSIS — I10 Essential (primary) hypertension: Secondary | ICD-10-CM

## 2012-05-05 NOTE — Telephone Encounter (Signed)
Labs ordered.

## 2012-05-05 NOTE — Telephone Encounter (Signed)
Pt blood work order will expire on 05-22-2012. Pt will resc bloodwork to 06-25-2012. Please put another order in

## 2012-05-21 ENCOUNTER — Other Ambulatory Visit: Payer: Medicare Other

## 2012-05-25 ENCOUNTER — Ambulatory Visit: Payer: Medicare Other | Admitting: Family Medicine

## 2012-05-27 ENCOUNTER — Encounter: Payer: Self-pay | Admitting: Internal Medicine

## 2012-06-04 ENCOUNTER — Encounter: Payer: Self-pay | Admitting: Internal Medicine

## 2012-06-25 ENCOUNTER — Other Ambulatory Visit (INDEPENDENT_AMBULATORY_CARE_PROVIDER_SITE_OTHER): Payer: Medicare Other

## 2012-06-25 DIAGNOSIS — I10 Essential (primary) hypertension: Secondary | ICD-10-CM

## 2012-06-25 DIAGNOSIS — E119 Type 2 diabetes mellitus without complications: Secondary | ICD-10-CM

## 2012-06-25 LAB — BASIC METABOLIC PANEL
Calcium: 9.2 mg/dL (ref 8.4–10.5)
Chloride: 104 mEq/L (ref 96–112)
Creatinine, Ser: 1.3 mg/dL (ref 0.4–1.5)
Sodium: 139 mEq/L (ref 135–145)

## 2012-06-25 LAB — HEMOGLOBIN A1C: Hgb A1c MFr Bld: 7 % — ABNORMAL HIGH (ref 4.6–6.5)

## 2012-07-02 ENCOUNTER — Ambulatory Visit: Payer: Medicare Other | Admitting: Family Medicine

## 2012-07-06 ENCOUNTER — Ambulatory Visit (INDEPENDENT_AMBULATORY_CARE_PROVIDER_SITE_OTHER): Payer: Medicare Other | Admitting: Family Medicine

## 2012-07-06 ENCOUNTER — Ambulatory Visit (AMBULATORY_SURGERY_CENTER): Payer: Medicare Other | Admitting: *Deleted

## 2012-07-06 ENCOUNTER — Encounter: Payer: Self-pay | Admitting: Family Medicine

## 2012-07-06 VITALS — BP 120/80 | Temp 98.5°F | Wt 251.0 lb

## 2012-07-06 VITALS — Ht 73.0 in | Wt 252.4 lb

## 2012-07-06 DIAGNOSIS — L255 Unspecified contact dermatitis due to plants, except food: Secondary | ICD-10-CM

## 2012-07-06 DIAGNOSIS — Z23 Encounter for immunization: Secondary | ICD-10-CM

## 2012-07-06 DIAGNOSIS — Z1211 Encounter for screening for malignant neoplasm of colon: Secondary | ICD-10-CM

## 2012-07-06 DIAGNOSIS — E119 Type 2 diabetes mellitus without complications: Secondary | ICD-10-CM

## 2012-07-06 DIAGNOSIS — L247 Irritant contact dermatitis due to plants, except food: Secondary | ICD-10-CM

## 2012-07-06 MED ORDER — TRIAMCINOLONE ACETONIDE 0.1 % EX OINT: TOPICAL_OINTMENT | Freq: Two times a day (BID) | CUTANEOUS | Status: DC

## 2012-07-06 MED ORDER — NA SULFATE-K SULFATE-MG SULF 17.5-3.13-1.6 GM/177ML PO SOLN: ORAL | Status: DC

## 2012-07-06 NOTE — Progress Notes (Signed)
  Subjective:    Patient ID: Charles [name], male    DOB: 07-24-1941, 71 y.o.   MRN: [id number]  HPI Charles [name] is a 71 year old male who comes in today for followup of diabetes and a new problem  His diabetes is treated with Glucotrol 10 mg twice a day, metformin 1000 mg twice a day. 4 months ago his A1c get up to 8 and I sent him to see Tollie Eth. She does work with him and his A1c has dropped to 7.0%. He says he only thing usually change disease improved his diet on a device from [employer] exercise and medications stay the same. [facility] to have mild   He also developed a rash on his right forearm for 2 weeks very red and periodic etiology unknown  Review of Systems General and metabolic review of systems otherwise negative    Objective:   Physical Exam  Well-developed well-nourished male in no acute distress  Examination skin shows a contact type dermatitis left forearm      Assessment & Plan:  Contact dermatitis plan ,,,,,,,,, triamcinolone gel twice daily  Diabetes type 2 at goal continue current therapy followup in 6 months

## 2012-07-06 NOTE — Patient Instructions (Addendum)
Continue current therapy  Followup in 6 months on your diabetes  Fasting labs one week prior

## 2012-07-09 ENCOUNTER — Telehealth: Payer: Self-pay | Admitting: Internal Medicine

## 2012-07-09 NOTE — Telephone Encounter (Signed)
Talked with pt: explained the benefit of split dose prep.  He agrees to proceed as planned.

## 2012-07-20 ENCOUNTER — Encounter: Payer: Self-pay | Admitting: Internal Medicine

## 2012-07-20 ENCOUNTER — Ambulatory Visit (AMBULATORY_SURGERY_CENTER): Payer: Medicare Other | Admitting: Internal Medicine

## 2012-07-20 VITALS — BP 120/76 | HR 78 | Temp 97.7°F | Resp 15 | Ht 73.0 in | Wt 252.0 lb

## 2012-07-20 DIAGNOSIS — K5730 Diverticulosis of large intestine without perforation or abscess without bleeding: Secondary | ICD-10-CM

## 2012-07-20 DIAGNOSIS — Z860100 Personal history of colon polyps, unspecified: Secondary | ICD-10-CM

## 2012-07-20 DIAGNOSIS — Z1211 Encounter for screening for malignant neoplasm of colon: Secondary | ICD-10-CM

## 2012-07-20 DIAGNOSIS — D126 Benign neoplasm of colon, unspecified: Secondary | ICD-10-CM

## 2012-07-20 DIAGNOSIS — K648 Other hemorrhoids: Secondary | ICD-10-CM

## 2012-07-20 DIAGNOSIS — K5520 Angiodysplasia of colon without hemorrhage: Secondary | ICD-10-CM

## 2012-07-20 HISTORY — DX: Personal history of colon polyps, unspecified: Z86.0100

## 2012-07-20 HISTORY — DX: Personal history of colonic polyps: Z86.010

## 2012-07-20 MED ORDER — SODIUM CHLORIDE 0.9 % IV SOLN: 500.0000 mL | INTRAVENOUS | Status: DC

## 2012-07-20 NOTE — Progress Notes (Signed)
Patient did not experience any of the following events: a burn prior to discharge; a fall within the facility; wrong site/side/patient/procedure/implant event; or a hospital transfer or hospital admission upon discharge from the facility. (G8907) Patient did not have preoperative order for IV antibiotic SSI prophylaxis. (G8918)  

## 2012-07-20 NOTE — Op Note (Signed)
[facility] [address] [place], [address]   COLONOSCOPY PROCEDURE REPORT  PATIENT: Charles [name], Charles [name]  MR#: [id number] BIRTHDATE: 07/31/1941 , 71  yrs. old GENDER: Male ENDOSCOPIST: Iva Boop, MD, [facility] PROCEDURE DATE:  07/20/2012 PROCEDURE:   Colonoscopy with biopsy ASA CLASS:   Class III INDICATIONS:screening and surveillance,personal history of colonic polyps and patient's personal history of adenomatous colon polyps.  MEDICATIONS: Propofol (Diprivan) 230 mg IV, These medications were titrated to patient response per physician's verbal order, and MAC sedation, administered by CRNA  DESCRIPTION OF PROCEDURE:   After the risks benefits and alternatives of the procedure were thoroughly explained, informed consent was obtained.  A digital rectal exam revealed no abnormalities of the rectum and A digital rectal exam revealed the prostate was not enlarged.   The LB [id number]  endoscope was introduced through the anus and advanced to the cecum, which was identified by both the appendix and ileocecal valve. No adverse events experienced.   The quality of the prep was Suprep good  The instrument was then slowly withdrawn as the colon was fully examined.      COLON FINDINGS: Two diminutive polypoid shaped sessile polyps were found in the transverse colon and rectum.  A polypectomy was performed with cold forceps.  The resection was complete and the polyp tissue was completely retrieved.   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   Moderate diverticulosis was noted in the ascending colon and sigmoid colon. A small transverse lipoma was seen.   Small internal hemorrhoids were found.   The colon mucosa was otherwise normal.  Retroflexed views revealed internal hemorrhoids. The time to cecum=5 minutes 01 seconds.  Withdrawal time=14 minutes 22 seconds.  The scope was withdrawn and the procedure  completed. COMPLICATIONS: There were no complications.  ENDOSCOPIC IMPRESSION: 1.   Two diminutive sessile polyps were found in the transverse colon and rectum; polypectomy was performed with cold forceps 2.   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   Moderate diverticulosis was noted in the ascending colon and sigmoid colon 4.   Small internal hemorrhoids 5.    Lipoma transverse colon 6.   The colon mucosa was otherwise normal - good prep 7.   Personal hx 3 adenomas 2009  RECOMMENDATIONS: Timing of repeat colonoscopy will be determined by pathology findings.   eSigned:  Iva Boop, MD, [facility] 07/20/2012 9:42 AM   cc: The Patient   PATIENT NAME:  Charles [name], Charles [name] MR#: [id number]

## 2012-07-20 NOTE — Progress Notes (Signed)
Propofol per Fayrene Helper, CRNA. ewm

## 2012-07-20 NOTE — Patient Instructions (Addendum)
Two tiny polyps were removed. Other findings were hemorrhoids, diverticulosis, a small lipoma (fatty tumor) and AVM's (blood vessels concentrated on the surface lining of the colon).  Thank you for choosing me and [facility].  Iva Boop, MD, [facility]  Discharge instructions given with verbal understanding. Handouts on polyps,diverticulosis and hemorrhoids given. Resume previous medications. YOU HAD AN ENDOSCOPIC PROCEDURE TODAY AT THE [facility]: Refer to the procedure report that was given to you for any specific questions about what was found during the examination.  If the procedure report does not answer your questions, please call your gastroenterologist to clarify.  If you requested that your care partner not be given the details of your procedure findings, then the procedure report has been included in a sealed envelope for you to review at your convenience later.  YOU SHOULD EXPECT: Some feelings of bloating in the abdomen. Passage of more gas than usual.  Walking can help get rid of the air that was put into your GI tract during the procedure and reduce the bloating. If you had a lower endoscopy (such as a colonoscopy or flexible sigmoidoscopy) you may notice spotting of blood in your stool or on the toilet paper. If you underwent a bowel prep for your procedure, then you may not have a normal bowel movement for a few days.  DIET: Your first meal following the procedure should be a light meal and then it is ok to progress to your normal diet.  A half-sandwich or bowl of soup is an example of a good first meal.  Heavy or fried foods are harder to digest and may make you feel nauseous or bloated.  Likewise meals heavy in dairy and vegetables can cause extra gas to form and this can also increase the bloating.  Drink plenty of fluids but you should avoid alcoholic beverages for 24 hours.  ACTIVITY: Your care partner should take you home directly after the  procedure.  You should plan to take it easy, moving slowly for the rest of the day.  You can resume normal activity the day after the procedure however you should NOT DRIVE or use heavy machinery for 24 hours (because of the sedation medicines used during the test).    SYMPTOMS TO REPORT IMMEDIATELY: A gastroenterologist can be reached at any hour.  During normal business hours, 8:30 AM to 5:00 PM Monday through Friday, call [phone].  After hours and on weekends, please call the GI answering service at [phone] who will take a message and have the physician on call contact you.   Following lower endoscopy (colonoscopy or flexible sigmoidoscopy):  Excessive amounts of blood in the stool  Significant tenderness or worsening of abdominal pains  Swelling of the abdomen that is new, acute  Fever of 100F or higher FOLLOW UP: If any biopsies were taken you will be contacted by phone or by letter within the next 1-3 weeks.  Call your gastroenterologist if you have not heard about the biopsies in 3 weeks.  Our staff will call the home number listed on your records the next business day following your procedure to check on you and address any questions or concerns that you may have at that time regarding the information given to you following your procedure. This is a courtesy call and so if there is no answer at the home number and we have not heard from you through the emergency physician on call, we will assume that you have returned  to your regular daily activities without incident.  SIGNATURES/CONFIDENTIALITY: You and/or your care partner have signed paperwork which will be entered into your electronic medical record.  These signatures attest to the fact that that the information above on your After Visit Summary has been reviewed and is understood.  Full responsibility of the confidentiality of this discharge information lies with you and/or your care-partner.  

## 2012-07-21 ENCOUNTER — Telehealth: Payer: Self-pay

## 2012-07-21 NOTE — Telephone Encounter (Signed)
  Follow up Call-  Call back number 07/20/2012  Post procedure Call Back phone  # [phone]  Permission to leave phone message Yes     Patient questions:  Do you have a fever, pain , or abdominal swelling? no Pain Score  0 *  Have you tolerated food without any problems? yes  Have you been able to return to your normal activities? yes  Do you have any questions about your discharge instructions: Diet   no Medications  no Follow up visit  no  Do you have questions or concerns about your Care? no  Actions: * If pain score is 4 or above: No action needed, pain <4.  No problems noted. Maw

## 2012-07-23 ENCOUNTER — Encounter: Payer: Self-pay | Admitting: Internal Medicine

## 2012-07-23 NOTE — Progress Notes (Signed)
Quick Note:  Hyperplastic polyps Hx 3 adenomas 2009 Repeat colon about 07/2017 recommended ______

## 2012-08-07 ENCOUNTER — Encounter: Payer: Self-pay | Admitting: Family Medicine

## 2012-08-21 ENCOUNTER — Other Ambulatory Visit: Payer: Self-pay | Admitting: Family Medicine

## 2012-12-28 ENCOUNTER — Other Ambulatory Visit: Payer: Medicare Other

## 2013-01-04 ENCOUNTER — Ambulatory Visit: Payer: Medicare Other | Admitting: Family Medicine

## 2013-02-15 ENCOUNTER — Other Ambulatory Visit: Payer: Medicare Other

## 2013-02-22 ENCOUNTER — Encounter: Payer: Medicare Other | Admitting: Family Medicine

## 2013-03-03 ENCOUNTER — Telehealth: Payer: Self-pay | Admitting: Family Medicine

## 2013-03-03 NOTE — Telephone Encounter (Signed)
'  House calls' is a program offered by pt's health insurance.  NP comes in once a year. Charles [name] needed to report abnormal test.  Not critical. Pt had 2 screening test done 6/11. A1c   8.3 LDL  116

## 2013-03-04 NOTE — Telephone Encounter (Signed)
Patient needs an office visit.  

## 2013-03-05 NOTE — Telephone Encounter (Signed)
Spoke with patient and an appointment made 

## 2013-03-05 NOTE — Telephone Encounter (Signed)
Pt has cpx scheduled for 04/15/13.

## 2013-03-15 ENCOUNTER — Ambulatory Visit (INDEPENDENT_AMBULATORY_CARE_PROVIDER_SITE_OTHER): Payer: Medicare Other | Admitting: Family Medicine

## 2013-03-15 ENCOUNTER — Encounter: Payer: Self-pay | Admitting: Family Medicine

## 2013-03-15 VITALS — BP 130/90 | Temp 98.5°F | Wt 254.0 lb

## 2013-03-15 DIAGNOSIS — E119 Type 2 diabetes mellitus without complications: Secondary | ICD-10-CM

## 2013-03-15 DIAGNOSIS — Z125 Encounter for screening for malignant neoplasm of prostate: Secondary | ICD-10-CM

## 2013-03-15 DIAGNOSIS — E785 Hyperlipidemia, unspecified: Secondary | ICD-10-CM

## 2013-03-15 DIAGNOSIS — E663 Overweight: Secondary | ICD-10-CM

## 2013-03-15 DIAGNOSIS — I10 Essential (primary) hypertension: Secondary | ICD-10-CM

## 2013-03-15 LAB — BASIC METABOLIC PANEL
BUN: 18 mg/dL (ref 6–23)
CO2: 27 mEq/L (ref 19–32)
GFR: 61.48 mL/min (ref >60.00)
Glucose, Bld: 106 mg/dL — ABNORMAL HIGH (ref 70–99)
Potassium: 5.2 mEq/L — ABNORMAL HIGH (ref 3.5–5.1)
Sodium: 138 mEq/L (ref 135–145)

## 2013-03-15 LAB — POCT URINALYSIS DIPSTICK
Ketones, UA: NEGATIVE
Leukocytes, UA: NEGATIVE
Protein, UA: NEGATIVE
Spec Grav, UA: 1.015
Urobilinogen, UA: 0.2
pH, UA: 7

## 2013-03-15 LAB — CBC WITH DIFFERENTIAL/PLATELET
Basophils Absolute: 0.1 10*3/uL (ref 0.0–0.1)
Eosinophils Absolute: 0.3 10*3/uL (ref 0.0–0.7)
Lymphocytes Relative: 28.8 % (ref 12.0–46.0)
MCHC: 33.5 g/dL (ref 30.0–36.0)
MCV: 94.3 fl (ref 78.0–100.0)
Monocytes Absolute: 0.6 10*3/uL (ref 0.1–1.0)
Neutrophils Relative %: 53.9 % (ref 43.0–77.0)
RDW: 13.7 % (ref 11.5–14.6)

## 2013-03-15 LAB — HEPATIC FUNCTION PANEL
ALT: 52 U/L (ref 0–53)
AST: 43 U/L — ABNORMAL HIGH (ref 0–37)
Alkaline Phosphatase: 54 U/L (ref 39–117)
Bilirubin, Direct: 0.1 mg/dL (ref 0.0–0.3)
Total Bilirubin: 0.9 mg/dL (ref 0.3–1.2)
Total Protein: 6.6 g/dL (ref 6.0–8.3)

## 2013-03-15 LAB — MICROALBUMIN / CREATININE URINE RATIO: Microalb, Ur: 2.6 mg/dL — ABNORMAL HIGH (ref 0.0–1.9)

## 2013-03-15 MED ORDER — METFORMIN HCL 1000 MG PO TABS: ORAL_TABLET | ORAL | Status: DC

## 2013-03-15 MED ORDER — LISINOPRIL 40 MG PO TABS: ORAL_TABLET | ORAL | Status: DC

## 2013-03-15 MED ORDER — GLIPIZIDE 10 MG PO TABS: ORAL_TABLET | ORAL | Status: DC

## 2013-03-15 NOTE — Progress Notes (Signed)
  Subjective:    Patient ID: Charles [name], male    DOB: 07/28/1941, 72 y.o.   MRN: [id number]  HPI Charles [name] is a 72 year old male nonsmoker who comes in today after a year plus absence. He had a home visit with a [employer] nurse who recommended he come in for evaluation of his diabetes  He says he did a home A1c and was told was a 0.3%.  He takes glipizide 10 mg twice a day, metformin 1000 mg daily  He takes lisinopril 40 mg a day for blood pressure control BP 130/90.  His weight is steady to 54 [facility]  He says overall he feels well except some tingling in his feet   Review of Systems Review of systems otherwise negative no chest pain or shortness of breath with exertion    Objective:   Physical Exam Well-developed well-nourished male no acute distress cardiac pulmonary abdominal exam are normal except for incisional hernia. Extremities normal skin normal pulses 1+ to 2. Sensation normal although he has tingling in his feet. Skin normal toenails normal       Assessment & Plan:  Diabetes type 2 not at goal check labs adjust medication followup A1c in 3 months  Hypertension at goal continue lisinopril 40 mg daily.  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

## 2013-03-15 NOTE — Patient Instructions (Signed)
Increase the metformin,,,,,,,,,,,, take 1000 mg twice daily  Continue the glipizide 10 mg twice daily  Check a fasting blood sugar daily in the morning  Return in one month for followup with a record of all your blood pressure readings  Continue lisinopril 40 mg daily for good blood pressure control an aspirin tablet  When you come back in a month come in fasting so we can recheck your fasting labs

## 2013-03-17 NOTE — Addendum Note (Signed)
Addended by: Kern Reap B on: 03/17/2013 11:42 AM   Modules accepted: Orders

## 2013-04-08 ENCOUNTER — Other Ambulatory Visit: Payer: Medicare Other

## 2013-04-09 ENCOUNTER — Encounter: Payer: Self-pay | Admitting: Internal Medicine

## 2013-04-09 ENCOUNTER — Ambulatory Visit (INDEPENDENT_AMBULATORY_CARE_PROVIDER_SITE_OTHER): Payer: Medicare Other | Admitting: Internal Medicine

## 2013-04-09 VITALS — BP 112/62 | HR 100 | Temp 97.7°F | Resp 12 | Ht 71.0 in | Wt 252.0 lb

## 2013-04-09 DIAGNOSIS — E119 Type 2 diabetes mellitus without complications: Secondary | ICD-10-CM

## 2013-04-09 NOTE — Patient Instructions (Addendum)
Please return in 2 months with your sugar log.   PATIENT INSTRUCTIONS FOR TYPE 2 DIABETES:  DIET AND EXERCISE Diet and exercise is an important part of diabetic treatment.  We recommended aerobic exercise in the form of brisk walking (working between 40-60% of maximal aerobic capacity, similar to brisk walking) for 150 minutes per week (such as 30 minutes five days per week) along with 3 times per week performing 'resistance' training (using various gauge rubber tubes with handles) 5-10 exercises involving the major muscle groups (upper body, lower body and core) performing 10-15 repetitions (or near fatigue) each exercise. Start at half the above goal but build slowly to reach the above goals. If limited by weight, joint pain, or disability, we recommend daily walking in a swimming pool with water up to waist to reduce pressure from joints while allow for adequate exercise.    BLOOD GLUCOSES Monitoring your blood glucoses is important for continued management of your diabetes. Please check your blood glucoses 2-4 times a day: fasting, before meals and at bedtime (you can rotate these measurements - e.g. one day check before the 3 meals, the next day check before 2 of the meals and before bedtime, etc.   HYPOGLYCEMIA (low blood sugar) Hypoglycemia is usually a reaction to not eating, exercising, or taking too much insulin/ other diabetes drugs.  Symptoms include tremors, sweating, hunger, confusion, headache, etc. Treat IMMEDIATELY with 15 grams of Carbs:   4 glucose tablets    cup regular juice/soda   2 tablespoons raisins   4 teaspoons sugar   1 tablespoon honey Recheck blood glucose in 15 mins and repeat above if still symptomatic/blood glucose <100. Please contact our office at [phone] if you have questions about how to next handle your insulin.  RECOMMENDATIONS TO REDUCE YOUR RISK OF DIABETIC COMPLICATIONS: * Take your prescribed MEDICATION(S). * Follow a DIABETIC diet: Complex  carbs, fiber rich foods, heart healthy fish twice weekly, (monounsaturated and polyunsaturated) fats * AVOID saturated/trans fats, high fat foods, >2,300 mg salt per day. * EXERCISE at least 5 times a week for 30 minutes or preferably daily.  * DO NOT SMOKE OR DRINK more than 1 drink a day. * Check your FEET every day. Do not wear tightfitting shoes. Contact us if you develop an ulcer * See your EYE doctor once a year or more if needed * Get a FLU shot once a year * Get a PNEUMONIA vaccine once before and once after age 44 years  GOALS:  * Your Hemoglobin A1c of <7%  * fasting sugars need to be <130 * after meals sugars need to be <180 (2h after you start eating) * Your Systolic BP should be 140 or lower  * Your Diastolic BP should be 80 or lower  * Your HDL (Good Cholesterol) should be 40 or higher  * Your LDL (Bad Cholesterol) should be 100 or lower  * Your Triglycerides should be 150 or lower  * Your Urine microalbumin (kidney function) should be <30 * Your Body Mass Index should be 25 or lower   We will be glad to help you achieve these goals. Our telephone number is: [phone].

## 2013-04-09 NOTE — Progress Notes (Signed)
Patient ID: Charles [name], male   DOB: [birth year], 72 y.o.   MRN: [id number]  HPI: Charles [name] is a 72 y.o.-year-old male, referred by his PCP, Dr.Todd, for management of DM2, non-insulin-dependent, uncontrolled, with complications (PN)..  Patient has been diagnosed with diabetes in ~2008; he has not been on insulin before. Last hemoglobin A1c was: Lab Results  Component Value Date   HGBA1C 8.9* 03/15/2013  Previously, it was 7.0%, prev. 8.0%.   Pt is on a regimen of: - Metformin 1000 mg po bid - glipizide 10 mg bid (increased from 5 mg ~ 1 mo ago)  He started to notice his sugars started to get higher in 03/2013. Last week he went to [facility] >> overeat >> 140s.  Pt checks his sugars once in am they are: - am: 70-150  No lows. Lowest sugar was 70s recently, but had a 56 >> felt it - after mowing the lawn; he has hypoglycemia awareness in the 60s. Highest sugar was 200, 2 mo ago.   Pt's meals are: - Breakfast: fruit, cheese, occasional cereal - Lunch: usually out - [place], vietnamese - Dinner: stirfry, BBQ chicken, kebop - Snacks: grazes after dinner, usually 2 snacks a day   Goes at [employer] 5 days a week >> walks, bikes.  - no CKD, last BUN/creatinine:  Lab Results  Component Value Date   BUN 18 03/15/2013   CREATININE 1.2 03/15/2013  of note, he had a left nephrectomy. He is on lisinopril.  - last set of lipids: Lab Results  Component Value Date   CHOL 195 02/13/2012   HDL 37.80* 02/13/2012   LDLCALC 122* 11/11/2008   LDLDIRECT 120.0 02/13/2012   TRIG 352.0* 02/13/2012   CHOLHDL 5 02/13/2012   - last eye exam was in 08/2012 - Dr Emily Filbert. No DR.  - + numbness and tingling in his feet >> improved  I reviewed his chart and he also has a history of HTN, HL, obesity. Also, see below.  Pt has FH of DM in mother.   ROS: Constitutional: no weight gain/loss, no fatigue, no subjective hyperthermia/hypothermia: + nocturia Eyes: no blurry vision, no xerophthalmia ENT: no sore throat,  no nodules palpated in throat, no dysphagia/odynophagia, + hoarseness; + decreased hearing Cardiovascular: no CP/SOB/palpitations/leg swelling Respiratory: no cough/SOB Gastrointestinal: no N/V/D/C Musculoskeletal: no muscle/joint aches Skin: no rashes, + if the bruising Neurological: no tremors/numbness/tingling/dizziness Psychiatric: no depression/anxiety  Past Medical History  Diagnosis Date  . Hyperlipidemia   . Obesity   . Diabetes mellitus   . Hypertension   . Eczema   . Biceps muscle tear     right  . Hernia, ventral   . 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 AVM (arteriovenous malformation) of colon     2 - non-bleeding 2013  . Personal history of adenomatous colonic polyps 07/20/2012    3 + adenomas 2009 07/20/2012 2 diminutive polyps     Past Surgical History  Procedure Laterality Date  . Inguinal hernia repair      left, x 3  . Inguinal hernia repair  2012    right  . Left nephrectomy  2000   History   Social History  . Marital Status: Single    Spouse Name: N/A    Number of Children: 3   Occupational History  . retired   Social History Main Topics  . Smoking status: Former Smoker    Quit date: 2000  . Smokeless tobacco:  Never Used  . Alcohol Use: 0.6 oz/week    1 Cans of beer per week  . Drug Use: No   Social History Narrative   Regular exercise: goes to the [facility]   Caffeine use: daily; coffee   Current Outpatient Prescriptions on File Prior to Visit  Medication Sig Dispense Refill  . aspirin 81 MG tablet Take 81 mg by mouth daily.        . BD ULTRA-FINE LANCETS lancets 1 each by Other route daily.  100 each  3  . EPINEPHrine (EPIPEN 2-PAK) 0.3 mg/0.3 mL DEVI Inject 0.3 mg into the muscle once.        Marland Kitchen glipiZIDE (GLUCOTROL) 10 MG tablet 1 by mouth twice a day  200 tablet  3  . glucose blood (ONE TOUCH TEST STRIPS) test strip 1 each by Other route daily. Use as instructed  100 each  3  . lisinopril (PRINIVIL,ZESTRIL) 40 MG tablet TAKE  ONE TABLET BY MOUTH EVERY DAY  100 tablet  3  . Lutein 20 MG TABS Take by mouth.        . metFORMIN (GLUCOPHAGE) 1000 MG tablet One tablet before breakfast and one tablet prior to evening meal  200 tablet  3  . Multiple Vitamin (MULTIVITAMIN) tablet Take 1 tablet by mouth daily.        Marland Kitchen triamcinolone ointment (KENALOG) 0.1 % Apply topically 2 (two) times daily.  30 g  2   No current facility-administered medications on file prior to visit.   No Known Allergies  Family History  Problem Relation Age of Onset  . Drug abuse Other   . Cancer Other   . Heart disease Other   . Lung disease Other   . Colon cancer Neg Hx   . Stomach cancer Neg Hx    PE: BP 112/62  Pulse 100  Temp(Src) 97.7 F (36.5 C) (Oral)  Resp 12  Ht 5\' 11"  (1.803 m)  Wt 252 lb (114.306 kg)  BMI 35.16 kg/m2  SpO2 98% Wt Readings from Last 3 Encounters:  04/09/13 252 lb (114.306 kg)  03/15/13 254 lb (115.214 kg)  07/20/12 252 lb (114.306 kg)   Constitutional: overweight, in NAD Eyes: PERRLA, EOMI, no exophthalmos ENT: moist mucous membranes, no thyromegaly, no cervical lymphadenopathy Cardiovascular: regularly irregular rhythm - PVCs, RR, No MRG Respiratory: CTA B Gastrointestinal: abdomen soft, NT, ND, BS+ Musculoskeletal: no deformities, strength intact in all 4 Skin: moist, warm, no rashes Neurological: no tremor with outstretched hands, DTR normal in all 4  ASSESSMENT: 1. DM2, non-insulin-dependent, uncontrolled, with complications - peripheral neuropathy  PLAN:  1. Patient with long-standing, recently more uncontrolled diabetes, on oral antidiabetic regimen. He saw Dr. Tawanna Cooler approximately a month ago, and at that time, his hemoglobin A1c was 8.3%, increasing from 7%. At that time, his glipizide was increased from 5 mg to 10 mg bid. Patient also started to change his diet, he noticed that his sugars are much better in the last month, with a 16-day average of 104. The sugars in the morning are also  within target range, almost 100%. We did discuss about a healthy diet and what he should avoid eating. He is doing a pretty good job with this. - We discussed about options for treatment, about the possibility of adding Januvia, Invokana, or bromocriptine, but I agree with the patient that this is a little bit premature, since sugars improved after increasing glipizide and improving diet. We both decided to meet again in 2  months, decide based on values in his sugar log. We'll also check a hemoglobin A1c at that time, and a foot exam. - We discussed about possible side effects of glipizide, which can be weight gain and hypoglycemia, and the lesion is most likely decrease the dose of glipizide if he continues to experience hypoglycemia - Strongly advised him to start checking her sugars at different times of the day - check 1-2 times a day, rotating checks - given sugar log and advised how to fill it and to bring it at next appt  - given foot care handout and explained the principles  - given instructions for hypoglycemia management "15-15 rule" - advised him to have glu tabs with him all the time - he does have yearly eye exams - I advised him to start taking the B12 vitamin, since he has symptoms of peripheral neuropathy, and is on metformin - Return to clinic in 2 months with sugar log

## 2013-04-14 ENCOUNTER — Encounter: Payer: Self-pay | Admitting: Family Medicine

## 2013-04-14 DIAGNOSIS — E119 Type 2 diabetes mellitus without complications: Secondary | ICD-10-CM

## 2013-04-14 MED ORDER — GLUCOSE BLOOD VI STRP: 1.0000 | ORAL_STRIP | Freq: Every day | Status: DC

## 2013-04-15 ENCOUNTER — Encounter: Payer: Medicare Other | Admitting: Family Medicine

## 2013-06-14 ENCOUNTER — Ambulatory Visit (INDEPENDENT_AMBULATORY_CARE_PROVIDER_SITE_OTHER): Payer: Medicare Other | Admitting: Internal Medicine

## 2013-06-14 ENCOUNTER — Encounter: Payer: Self-pay | Admitting: Internal Medicine

## 2013-06-14 VITALS — BP 118/80 | HR 103 | Temp 98.7°F | Resp 12 | Ht 71.0 in | Wt 252.5 lb

## 2013-06-14 DIAGNOSIS — Z23 Encounter for immunization: Secondary | ICD-10-CM

## 2013-06-14 DIAGNOSIS — E119 Type 2 diabetes mellitus without complications: Secondary | ICD-10-CM

## 2013-06-14 MED ORDER — METFORMIN HCL ER (MOD) 1000 MG PO TB24: ORAL_TABLET | ORAL | Status: DC

## 2013-06-14 NOTE — Patient Instructions (Signed)
I will call in Metformin extended release - take 2000 mg in am. Continue Glipizide 10 mg 2x a day.

## 2013-06-14 NOTE — Progress Notes (Signed)
Patient ID: Charles [name], male   DOB: Nov 15, 1940, 72 y.o.   MRN: [id number]  HPI: Charles [name] is a 72 y.o.-year-old male, returning for f/u for DM2 dx 2008, non-insulin-dependent, uncontrolled, with complications (PN, mild CKD). Last visit ~ 2 mo ago.  Last hemoglobin A1c was: Lab Results  Component Value Date   HGBA1C 8.9* 03/15/2013  Previously, it was 7.0%, prev. 8.0%.   Pt is on a regimen of: - Metformin 1000 mg po bid - glipizide 10 mg bid  Pt checks his sugars once in am (brings log >> we reviewed this together) - they are almost 100% at goal: - am: 70-150 >> 87-141, most 90-120 - later in the day 105-141, but very few measurements No lows. Lowest sugar was 67; he has hypoglycemia awareness in the 60s. Highest sugar was 175.   [facility] walks, bikes.  - has mild CKD, last BUN/creatinine:  Lab Results  Component Value Date   BUN 18 03/15/2013   CREATININE 1.2 03/15/2013  of note, he had a left nephrectomy. He is on lisinopril. ACR in 03/2013: 5.3.  - He has HTG; last set of lipids: Lab Results  Component Value Date   CHOL 195 02/13/2012   HDL 37.80* 02/13/2012   LDLCALC 122* 11/11/2008   LDLDIRECT 120.0 02/13/2012   TRIG 352.0* 02/13/2012   CHOLHDL 5 02/13/2012   - last eye exam was in 08/2012 - Dr Emily Filbert. No DR.  - + numbness and tingling in his feet >> improved  He also has a history of HTN, HL, obesity. Also, see below.  ROS: Constitutional: no weight gain/loss, no fatigue, no subjective hyperthermia/hypothermia Eyes: no blurry vision, no xerophthalmia ENT: no sore throat, no nodules palpated in throat, no dysphagia/odynophagia, no hoarseness Cardiovascular: no CP/SOB/palpitations/leg swelling Respiratory: no cough/SOB Gastrointestinal: no N/V/D/C Musculoskeletal: no muscle/joint aches Skin: no rashes Neurological: no tremors/numbness/tingling/dizziness  I reviewed pt's medications, allergies, PMH, social hx, family hx and no changes required, except  as mentioned above.  PE: BP 118/80  Pulse 103  Temp(Src) 98.7 F (37.1 C) (Oral)  Resp 12  Ht 5\' 11"  (1.803 m)  Wt 252 lb 8 oz (114.533 kg)  BMI 35.23 kg/m2  SpO2 96% Wt Readings from Last 3 Encounters:  06/14/13 252 lb 8 oz (114.533 kg)  04/09/13 252 lb (114.306 kg)  03/15/13 254 lb (115.214 kg)   Constitutional: overweight, in NAD Eyes: PERRLA, EOMI, no exophthalmos ENT: moist mucous membranes, no thyromegaly, no cervical lymphadenopathy Cardiovascular: regularly irregular rhythm - PVCs, RR, No MRG Respiratory: CTA B Gastrointestinal: abdomen soft, NT, ND, BS+ Musculoskeletal: no deformities, strength intact in all 4 Skin: moist, warm, no rashes Neurological: no tremor with outstretched hands, DTR normal in all 4 - foot exam performed today  ASSESSMENT: 1. DM2, non-insulin-dependent, uncontrolled, with complications - peripheral neuropathy - CKD  PLAN:  1. Patient with long-standing, recently more uncontrolled diabetes, on oral antidiabetic regimen. He is doing great from the diabetes point of view, with almost all sugars at goal. - he will get a HbA1c checked later this month when he sees Dr Tawanna Cooler (now 1 day short of the 3 mo interval) - he does not check sugars later in the day usually, so I advised him to do so - he c/o forgetting the second dose of Metformin and Glipizide. We can try to switch to the Metformin XR (I am not sure he can afford this) and can also try Glipizide  XL if covered by insurance. If not, continue same regimen. - he does have yearly eye exams - coming up in November - given flu vaccine - foot exam performed today - started taking  B12 vitamin as advised at last visit(has symptoms of peripheral neuropathy, and is on metformin) - Return to clinic in 3 months with sugar log

## 2013-06-18 ENCOUNTER — Ambulatory Visit: Payer: Medicare Other | Admitting: Internal Medicine

## 2013-06-29 ENCOUNTER — Other Ambulatory Visit (INDEPENDENT_AMBULATORY_CARE_PROVIDER_SITE_OTHER): Payer: Medicare Other

## 2013-06-29 DIAGNOSIS — I10 Essential (primary) hypertension: Secondary | ICD-10-CM

## 2013-06-29 DIAGNOSIS — Z125 Encounter for screening for malignant neoplasm of prostate: Secondary | ICD-10-CM

## 2013-06-29 DIAGNOSIS — E1129 Type 2 diabetes mellitus with other diabetic kidney complication: Secondary | ICD-10-CM

## 2013-06-29 DIAGNOSIS — N058 Unspecified nephritic syndrome with other morphologic changes: Secondary | ICD-10-CM

## 2013-06-29 DIAGNOSIS — Z0000 Encounter for general adult medical examination without abnormal findings: Secondary | ICD-10-CM

## 2013-06-29 DIAGNOSIS — E785 Hyperlipidemia, unspecified: Secondary | ICD-10-CM

## 2013-06-29 LAB — POCT URINALYSIS DIPSTICK
Bilirubin, UA: NEGATIVE
Blood, UA: NEGATIVE
Ketones, UA: NEGATIVE
Spec Grav, UA: 1.025
Urobilinogen, UA: 0.2
pH, UA: 5.5

## 2013-06-29 LAB — LIPID PANEL
Cholesterol: 207 mg/dL — ABNORMAL HIGH (ref 0–200)
HDL: 43.8 mg/dL (ref >39.00)
Total CHOL/HDL Ratio: 5
Triglycerides: 202 mg/dL — ABNORMAL HIGH (ref 0.0–149.0)

## 2013-06-29 LAB — BASIC METABOLIC PANEL
BUN: 19 mg/dL (ref 6–23)
CO2: 29 mEq/L (ref 19–32)
Chloride: 102 mEq/L (ref 96–112)
Creatinine, Ser: 1.2 mg/dL (ref 0.4–1.5)
Glucose, Bld: 130 mg/dL — ABNORMAL HIGH (ref 70–99)
Potassium: 5.2 mEq/L — ABNORMAL HIGH (ref 3.5–5.1)

## 2013-06-29 LAB — CBC WITH DIFFERENTIAL/PLATELET
Basophils Absolute: 0.1 10*3/uL (ref 0.0–0.1)
Eosinophils Absolute: 0.3 10*3/uL (ref 0.0–0.7)
Eosinophils Relative: 4.9 % (ref 0.0–5.0)
HCT: 40.1 % (ref 39.0–52.0)
Lymphs Abs: 1.1 10*3/uL (ref 0.7–4.0)
MCHC: 33.5 g/dL (ref 30.0–36.0)
MCV: 88.9 fl (ref 78.0–100.0)
Monocytes Absolute: 0.7 10*3/uL (ref 0.1–1.0)
Neutrophils Relative %: 61.9 % (ref 43.0–77.0)
Platelets: 224 10*3/uL (ref 150.0–400.0)
RDW: 13.8 % (ref 11.5–14.6)

## 2013-06-29 LAB — LDL CHOLESTEROL, DIRECT: Direct LDL: 132.8 mg/dL

## 2013-06-29 LAB — HEPATIC FUNCTION PANEL
AST: 34 U/L (ref 0–37)
Albumin: 3.9 g/dL (ref 3.5–5.2)
Alkaline Phosphatase: 51 U/L (ref 39–117)
Total Protein: 6.5 g/dL (ref 6.0–8.3)

## 2013-06-29 LAB — MICROALBUMIN / CREATININE URINE RATIO
Microalb Creat Ratio: 12.8 mg/g (ref 0.0–30.0)
Microalb, Ur: 17 mg/dL — ABNORMAL HIGH (ref 0.0–1.9)

## 2013-07-06 ENCOUNTER — Ambulatory Visit (INDEPENDENT_AMBULATORY_CARE_PROVIDER_SITE_OTHER): Payer: Medicare Other | Admitting: Family Medicine

## 2013-07-06 ENCOUNTER — Encounter: Payer: Self-pay | Admitting: Family Medicine

## 2013-07-06 VITALS — BP 130/80 | Temp 98.2°F | Ht 71.0 in | Wt 256.0 lb

## 2013-07-06 DIAGNOSIS — N058 Unspecified nephritic syndrome with other morphologic changes: Secondary | ICD-10-CM

## 2013-07-06 DIAGNOSIS — E119 Type 2 diabetes mellitus without complications: Secondary | ICD-10-CM

## 2013-07-06 DIAGNOSIS — E1129 Type 2 diabetes mellitus with other diabetic kidney complication: Secondary | ICD-10-CM

## 2013-07-06 DIAGNOSIS — E663 Overweight: Secondary | ICD-10-CM

## 2013-07-06 DIAGNOSIS — I10 Essential (primary) hypertension: Secondary | ICD-10-CM

## 2013-07-06 DIAGNOSIS — L247 Irritant contact dermatitis due to plants, except food: Secondary | ICD-10-CM

## 2013-07-06 DIAGNOSIS — E785 Hyperlipidemia, unspecified: Secondary | ICD-10-CM

## 2013-07-06 DIAGNOSIS — L255 Unspecified contact dermatitis due to plants, except food: Secondary | ICD-10-CM

## 2013-07-06 DIAGNOSIS — Z86010 Personal history of colonic polyps: Secondary | ICD-10-CM

## 2013-07-06 MED ORDER — LISINOPRIL 40 MG PO TABS: ORAL_TABLET | ORAL | Status: DC

## 2013-07-06 MED ORDER — METFORMIN HCL ER (MOD) 1000 MG PO TB24: ORAL_TABLET | ORAL | Status: DC

## 2013-07-06 MED ORDER — TRIAMCINOLONE ACETONIDE 0.1 % EX OINT: TOPICAL_OINTMENT | Freq: Two times a day (BID) | CUTANEOUS | Status: DC

## 2013-07-06 MED ORDER — ONE-DAILY MULTI VITAMINS PO TABS: 1.0000 | ORAL_TABLET | Freq: Every day | ORAL | Status: DC

## 2013-07-06 MED ORDER — BD ULTRA-FINE LANCETS MISC: 1.0000 | Freq: Every day | Status: DC

## 2013-07-06 MED ORDER — GLIPIZIDE 10 MG PO TABS: ORAL_TABLET | ORAL | Status: DC

## 2013-07-06 MED ORDER — GLUCOSE BLOOD VI STRP: 1.0000 | ORAL_STRIP | Freq: Every day | Status: DC

## 2013-07-06 NOTE — Progress Notes (Signed)
  Subjective:    Patient ID: Charles [name], male    DOB: 1941-08-02, 72 y.o.   MRN: [id number]  HPI Charles [name] is a 72 year old male nonsmoker who comes in today for a Medicare wellness examination because of a history of diabetes type 2, obesity,  His most recent A1c is 7.4%. Microalbumin 17 and LDL cholesterol is 132 however we've tried statins in the past and these been intolerant of all the statins  He gets routine eye care, dental care, recent colonoscopy, vaccinations up-to-date except he is due to shingles vaccine.  Cognitive function normal he is now walking 2 miles a day which is probably why his blood sugar has dropped, home health safety reviewed no issues identified, no guns in the house, he does have a health care power of attorney and living well. He also says he is taking his medication on a daily basis. Weight is unchanged 256   Review of Systems  Constitutional: Negative.   HENT: Negative.   Eyes: Negative.   Respiratory: Negative.   Cardiovascular: Negative.   Gastrointestinal: Negative.   Endocrine: Negative.   Genitourinary: Negative.   Musculoskeletal: Negative.   Skin: Negative.   Allergic/Immunologic: Negative.   Neurological: Negative.   Hematological: Negative.   Psychiatric/Behavioral: Negative.        Objective:   Physical Exam  Nursing note and vitals reviewed. Constitutional: He is oriented to person, place, and time. He appears well-developed and well-nourished.  HENT:  Head: Normocephalic and atraumatic.  Right Ear: External ear normal.  Left Ear: External ear normal.  Nose: Nose normal.  Mouth/Throat: Oropharynx is clear and moist.  Eyes: Conjunctivae and EOM are normal. Pupils are equal, round, and reactive to light.  Neck: Normal range of motion. Neck supple. No JVD present. No tracheal deviation present. No thyromegaly present.  Cardiovascular: Normal rate, regular rhythm, normal heart sounds and intact distal pulses.  Exam reveals no gallop and  no friction rub.   No murmur heard. No carotid or aortic bruits peripheral pulses 1+ and symmetrical  Pulmonary/Chest: Effort normal and breath sounds normal. No stridor. No respiratory distress. He has no wheezes. He has no rales. He exhibits no tenderness.  Abdominal: Soft. Bowel sounds are normal. He exhibits no distension and no mass. There is no tenderness. There is no rebound and no guarding.  Incisional hernia in the abdomen. He had a nephrectomy a couple years ago for renal cell carcinoma. Hernia remains unchanged. Also scars in right left lower corner previous inguinal hernia repair  Genitourinary: Rectum normal and penis normal. Guaiac negative stool. No penile tenderness.  1+ symmetrical BPH  Musculoskeletal: Normal range of motion. He exhibits no edema and no tenderness.  Lymphadenopathy:    He has no cervical adenopathy.  Neurological: He is alert and oriented to person, place, and time. He has normal reflexes. No cranial nerve deficit. He exhibits normal muscle tone.  Skin: Skin is warm and dry. No rash noted. No erythema. No pallor.  Psychiatric: He has a normal mood and affect. His behavior is normal. Judgment and thought content normal.          Assessment & Plan:  Diabetes type 2 at goal continue current therapy  Hypertension at goal continue current therapy  Hyperlipidemia not at goal intolerant of statins  Overweight continue diet exercise and weight loss  Status post renal cell carcinoma........ nephrectomy

## 2013-07-06 NOTE — Patient Instructions (Signed)
Take the 2000 mg metformin tablet once a day as directed by the endocrinologist  Stop the regular metformin  Continue diet and exercise program and monitoring your blood sugar  Followup with Dr. Trula Ore in January  Return to see me in a year for followup sooner if any problems  Check with your insurance company to find out where you can get the best Price on your shingles vaccine

## 2013-07-25 ENCOUNTER — Inpatient Hospital Stay (HOSPITAL_COMMUNITY)
Admission: EM | Admit: 2013-07-25 | Discharge: 2013-07-29 | DRG: 482 | Disposition: A | Discharge status: Skilled Nursing Facility | Payer: Medicare Other | Attending: Internal Medicine | Admitting: Internal Medicine

## 2013-07-25 ENCOUNTER — Encounter (HOSPITAL_COMMUNITY): Payer: Self-pay | Admitting: Emergency Medicine

## 2013-07-25 ENCOUNTER — Emergency Department (HOSPITAL_COMMUNITY): Payer: Medicare Other

## 2013-07-25 DIAGNOSIS — Y92009 Unspecified place in unspecified non-institutional (private) residence as the place of occurrence of the external cause: Secondary | ICD-10-CM

## 2013-07-25 DIAGNOSIS — C649 Malignant neoplasm of unspecified kidney, except renal pelvis: Secondary | ICD-10-CM | POA: Diagnosis present

## 2013-07-25 DIAGNOSIS — R000 Tachycardia, unspecified: Secondary | ICD-10-CM | POA: Diagnosis present

## 2013-07-25 DIAGNOSIS — Z8249 Family history of ischemic heart disease and other diseases of the circulatory system: Secondary | ICD-10-CM

## 2013-07-25 DIAGNOSIS — W010XXA Fall on same level from slipping, tripping and stumbling without subsequent striking against object, initial encounter: Secondary | ICD-10-CM | POA: Diagnosis present

## 2013-07-25 DIAGNOSIS — E1129 Type 2 diabetes mellitus with other diabetic kidney complication: Secondary | ICD-10-CM

## 2013-07-25 DIAGNOSIS — Z87891 Personal history of nicotine dependence: Secondary | ICD-10-CM

## 2013-07-25 DIAGNOSIS — E663 Overweight: Secondary | ICD-10-CM

## 2013-07-25 DIAGNOSIS — S7223XA Displaced subtrochanteric fracture of unspecified femur, initial encounter for closed fracture: Principal | ICD-10-CM | POA: Diagnosis present

## 2013-07-25 DIAGNOSIS — L259 Unspecified contact dermatitis, unspecified cause: Secondary | ICD-10-CM | POA: Diagnosis present

## 2013-07-25 DIAGNOSIS — S72009A Fracture of unspecified part of neck of unspecified femur, initial encounter for closed fracture: Secondary | ICD-10-CM

## 2013-07-25 DIAGNOSIS — Z905 Acquired absence of kidney: Secondary | ICD-10-CM

## 2013-07-25 DIAGNOSIS — Z7901 Long term (current) use of anticoagulants: Secondary | ICD-10-CM

## 2013-07-25 DIAGNOSIS — Z7982 Long term (current) use of aspirin: Secondary | ICD-10-CM

## 2013-07-25 DIAGNOSIS — N058 Unspecified nephritic syndrome with other morphologic changes: Secondary | ICD-10-CM

## 2013-07-25 DIAGNOSIS — Z6379 Other stressful life events affecting family and household: Secondary | ICD-10-CM

## 2013-07-25 DIAGNOSIS — IMO0002 Reserved for concepts with insufficient information to code with codable children: Secondary | ICD-10-CM

## 2013-07-25 DIAGNOSIS — E785 Hyperlipidemia, unspecified: Secondary | ICD-10-CM

## 2013-07-25 DIAGNOSIS — S72001A Fracture of unspecified part of neck of right femur, initial encounter for closed fracture: Secondary | ICD-10-CM

## 2013-07-25 DIAGNOSIS — Z85528 Personal history of other malignant neoplasm of kidney: Secondary | ICD-10-CM

## 2013-07-25 DIAGNOSIS — Z860100 Personal history of colon polyps, unspecified: Secondary | ICD-10-CM

## 2013-07-25 DIAGNOSIS — Z79899 Other long term (current) drug therapy: Secondary | ICD-10-CM

## 2013-07-25 DIAGNOSIS — E669 Obesity, unspecified: Secondary | ICD-10-CM | POA: Diagnosis present

## 2013-07-25 DIAGNOSIS — I10 Essential (primary) hypertension: Secondary | ICD-10-CM

## 2013-07-25 DIAGNOSIS — Y93H2 Activity, gardening and landscaping: Secondary | ICD-10-CM

## 2013-07-25 DIAGNOSIS — E1165 Type 2 diabetes mellitus with hyperglycemia: Secondary | ICD-10-CM | POA: Diagnosis present

## 2013-07-25 DIAGNOSIS — Z6833 Body mass index (BMI) 33.0-33.9, adult: Secondary | ICD-10-CM

## 2013-07-25 LAB — CBC WITH DIFFERENTIAL/PLATELET
Basophils Absolute: 0 10*3/uL (ref 0.0–0.1)
Basophils Relative: 1 % (ref 0–1)
Eosinophils Absolute: 0.1 10*3/uL (ref 0.0–0.7)
Eosinophils Relative: 2 % (ref 0–5)
HCT: 40.3 % (ref 39.0–52.0)
Hemoglobin: 14 g/dL (ref 13.0–17.0)
MCH: 30.6 pg (ref 26.0–34.0)
MCHC: 34.7 g/dL (ref 30.0–36.0)
MCV: 88.2 fL (ref 78.0–100.0)
Monocytes Absolute: 0.5 10*3/uL (ref 0.1–1.0)
Monocytes Relative: 6 % (ref 3–12)
Neutrophils Relative %: 78 % — ABNORMAL HIGH (ref 43–77)
WBC: 8.7 10*3/uL (ref 4.0–10.5)

## 2013-07-25 LAB — BASIC METABOLIC PANEL
BUN: 16 mg/dL (ref 6–23)
CO2: 25 mEq/L (ref 19–32)
Calcium: 9.3 mg/dL (ref 8.4–10.5)
GFR calc non Af Amer: 59 mL/min — ABNORMAL LOW (ref >90)
Glucose, Bld: 187 mg/dL — ABNORMAL HIGH (ref 70–99)
Potassium: 4.3 mEq/L (ref 3.5–5.1)

## 2013-07-25 LAB — PROTIME-INR: INR: 1.03 (ref 0.00–1.49)

## 2013-07-25 LAB — GLUCOSE, CAPILLARY: Glucose-Capillary: 214 mg/dL — ABNORMAL HIGH (ref 70–99)

## 2013-07-25 MED ORDER — SODIUM CHLORIDE 0.9 % IV SOLN
1000.0000 mL | INTRAVENOUS | Status: DC
  Administered 2013-07-25: 1000 mL via INTRAVENOUS

## 2013-07-25 MED ORDER — POLYETHYLENE GLYCOL 3350 17 G PO PACK: 17.0000 g | PACK | Freq: Every day | ORAL | Status: DC | PRN

## 2013-07-25 MED ORDER — HYDROMORPHONE HCL PF 1 MG/ML IJ SOLN
0.5000 mg | INTRAMUSCULAR | Status: DC | PRN
  Administered 2013-07-25: 0.5 mg via INTRAVENOUS
  Filled 2013-07-25: qty 1

## 2013-07-25 MED ORDER — ONDANSETRON HCL 4 MG/2ML IJ SOLN
4.0000 mg | Freq: Once | INTRAMUSCULAR | Status: AC
  Administered 2013-07-25: 4 mg via INTRAVENOUS
  Filled 2013-07-25: qty 2

## 2013-07-25 MED ORDER — HYDROCODONE-ACETAMINOPHEN 5-325 MG PO TABS
1.0000 | ORAL_TABLET | Freq: Four times a day (QID) | ORAL | Status: DC | PRN
  Administered 2013-07-25: 2 via ORAL
  Filled 2013-07-25 (×2): qty 2

## 2013-07-25 MED ORDER — MORPHINE SULFATE 2 MG/ML IJ SOLN
0.5000 mg | INTRAMUSCULAR | Status: DC | PRN
  Administered 2013-07-26 (×7): 0.5 mg via INTRAVENOUS
  Filled 2013-07-25 (×7): qty 1

## 2013-07-25 NOTE — ED Notes (Signed)
Per [facility], pt from home for fall and right hip pain. Tripped over something and fell and laid there for an hour before getting help. 20g LH, given Fentanyl and 4 mg Zofran. Has positive shortening and rotation of right leg.

## 2013-07-25 NOTE — H&P (Signed)
[facility] History and Physical  LYRIK BURESH WUX:[id number] DOB: [birth year] DOA: 07/25/2013  Referring physician: Linwood Dibbles, ER physician PCP: Evette Georges, MD  Specialists: [facility], Orthopedic surgery  Chief Complaint: Fall with leg pain  HPI: Charles [name] is a 72 y.o. male  Past medical history hyperlipidemia, diabetes mellitus and hypertension are well controlled who was out blowing leaves today on his driveway when he stumbled over a flower pot sustaining a mechanical fall and landed on his right side. Patient had severe pain in his right leg and was unable to stand. When the pain did not get any better, he called paramedics. Patient was brought in in the emergency room was found to have a fracture in the intertrochanteric and subtrochanteric region of the right femur with extension into the femoral neck. EKG and labs are otherwise unremarkable. Hospitalists were called for further evaluation and admission. Orthopedics were called and planned to operate either tonight or tomorrow.  Review of Systems: Patient seen down emergency room. Doing okay, complains of some pain in his right hip at site of impact. Denies any headaches, vision changes, dysphasia, chest pain, palpitations, shortness of breath, wheeze, cough, abdominal pain, hematuria, dysuria, constipation, diarrhea, focal extremity numbness or weakness or pain other than described above. His review systems otherwise negative.  Past Medical History  Diagnosis Date  . Hyperlipidemia   . Obesity   . Diabetes mellitus   . Hypertension   . Eczema   . Biceps muscle tear     right  . Hernia, ventral   . 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 AVM (arteriovenous malformation) of colon     2 - non-bleeding 2013  . Personal history of adenomatous colonic polyps 07/20/2012    3 + adenomas 2009 07/20/2012 2 diminutive polyps     Past Surgical History  Procedure Laterality Date  . Inguinal hernia repair     left, x 3  . Inguinal hernia repair  2012    right  . Left nephrectomy  2000   Social History:  reports that he quit smoking about 12 months ago. He has never used smokeless tobacco. He reports that he drinks about 0.6 ounces of alcohol per week. He reports that he does not use illicit drugs. Patient lives at home by himself. Able to participate in all activities of daily living  No Known Allergies  Family History  Problem Relation Age of Onset  . Drug abuse Other   . Cancer Other   . Heart disease Other   . Lung disease Other   . Colon cancer Neg Hx   . Stomach cancer Neg Hx     Prior to Admission medications   Medication Sig Start Date End Date Taking? Authorizing Provider  aspirin 81 MG tablet Take 81 mg by mouth daily. Monday-Friday   Yes Historical Provider, MD  Cyanocobalamin (VITAMIN B 12 PO) Take 1 tablet by mouth daily.   Yes Historical Provider, MD  EPINEPHrine (EPIPEN 2-PAK) 0.3 mg/0.3 mL DEVI Inject 0.3 mg into the muscle once.     Yes Historical Provider, MD  glipiZIDE (GLUCOTROL) 10 MG tablet 1 by mouth twice a day 07/06/13  Yes Roderick Pee, MD  lisinopril (PRINIVIL,ZESTRIL) 40 MG tablet Take 40 mg by mouth daily.   Yes Historical Provider, MD  metFORMIN (GLUMETZA) 1000 MG (MOD) 24 hr tablet Take 1,000 mg by mouth 2 (two) times daily.   Yes Historical Provider, MD  Multiple Vitamin (  MULTIVITAMIN) tablet Take 1 tablet by mouth daily. 07/06/13  Yes Roderick Pee, MD  Multiple Vitamins-Minerals (EYE VITAMINS) CAPS Take by mouth.   Yes Historical Provider, MD   Physical Exam: Filed Vitals:   07/25/13 1545  BP: 140/92  Pulse: 94  Resp: 21     General:  Alert and oriented x3, no acute distress, fatigue, looks younger than stated age  Eyes: Sclera nonicteric, extraocular movements are intact  ENT: Normocephalic, atraumatic, mucous membranes are slightly dry  Neck: Supple, no JVD  Cardiovascular: Regular rate and rhythm, S1-S2  Respiratory: Clear to  auscultation bilaterally  Abdomen: Soft, obese, nontender, positive bowel sounds  Skin: Skin breaks, tears or lesions  Musculoskeletal: Patient has some bruising over the right second finger. No clubbing or cyanosis, trace edema  Psychiatric: Patient is appropriate, no evidence of psychoses  Neurologic: No overt deficits  Labs on Admission:  Basic Metabolic Panel:  Recent Labs Lab 07/25/13 1532  NA 137  K 4.3  CL 99  CO2 25  GLUCOSE 187*  BUN 16  CREATININE 1.19  CALCIUM 9.3   Liver Function Tests: No results found for this basename: AST, ALT, ALKPHOS, BILITOT, PROT, ALBUMIN,  in the last 168 hours No results found for this basename: LIPASE, AMYLASE,  in the last 168 hours No results found for this basename: AMMONIA,  in the last 168 hours CBC:  Recent Labs Lab 07/25/13 1532  WBC 8.7  NEUTROABS 6.8  HGB 14.0  HCT 40.3  MCV 88.2  PLT 220   Cardiac Enzymes: No results found for this basename: CKTOTAL, CKMB, CKMBINDEX, TROPONINI,  in the last 168 hours  BNP (last 3 results) No results found for this basename: PROBNP,  in the last 8760 hours CBG: No results found for this basename: GLUCAP,  in the last 168 hours  Radiological Exams on Admission: Dg Chest 1 View  07/25/2013   CLINICAL DATA:  Right hip fracture  EXAM: CHEST - 1 VIEW  COMPARISON:  None.  FINDINGS: Artifact overlies chest. Heart size is normal. Mediastinal shadows are normal. The lungs are clear. No effusions. No bony abnormalities.  IMPRESSION: No active disease   Electronically Signed   By: Paulina Fusi M.D.   On: 07/25/2013 16:34   Dg Hip Complete Right  07/25/2013   CLINICAL DATA:  Larey Seat.  Pain.  EXAM: RIGHT HIP - COMPLETE 2+ VIEW  COMPARISON:  None.  FINDINGS: There is an inter trochanteric and sub trochanteric fracture of the femur with extension into the femoral neck. Separation of the main fragments measures approximately 2 cm. No fracture of the bones of the pelvis. Left proximal femur  appears normal as seen.  IMPRESSION: Fracture in the intertrochanteric and sub trochanteric region of the right femur with extension into the femoral neck. Maximum separation of fragments inferiorly is 2 cm. Somewhat unusual fracture pattern.   Electronically Signed   By: Paulina Fusi M.D.   On: 07/25/2013 16:34    EKG: Independently reviewed. Normal sinus rhythm  Assessment/Plan Principal Problem:   Hip fracture, right: Orthopedic surgery to see. Operated tonight or tomorrow. From a medical standpoint, patient is cleared Active Problems:   Type II diabetes mellitus with renal manifestations, uncontrolled: Sliding scale every 4 hours until after surgery    HYPERLIPIDEMIA: Stable. Holding statin.    HYPERTENSION: Stable. Holding antihypertensives   history of renal cell carcinoma status post nephrectomy: Need to monitor drugs really cleared   Code Status: Full code  Family Communication:  Daughters are driving down here from [place]  Disposition Plan: Likely will need to go to short term killed nursing  Time spent: 25 minutes  Hollice Espy [facility] Pager [phone]  If 7PM-7AM, please contact night-coverage [email or web address] Password [facility] 07/25/2013, 5:28 PM

## 2013-07-25 NOTE — ED Notes (Signed)
Phlebotomy at the bedside  

## 2013-07-25 NOTE — Consult Note (Signed)
Reason for Consult:right hip fracture Referring Physician: Dr. Lynelle Doctor, Dr. Dorothey Baseman Charles [name] is an 72 y.o. male.  HPI: Charles [name] presented with the chief complaint of right hip pain after a fall at his home. He reports that he tripped over a flower pot in his driveway. After the fall, he was unable to bear weight on the right LE. He was then transported to the ED for evaluation.   He was blowing leaves when he stumbled falling backwards.  He is unsure if he fell on some pavers at the level of his injury.  He is Charles [name]'s father in law  Past Medical History  Diagnosis Date  . Hyperlipidemia   . Obesity   . Diabetes mellitus   . Hypertension   . Eczema   . Biceps muscle tear     right  . Hernia, ventral   . 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 AVM (arteriovenous malformation) of colon     2 - non-bleeding 2013  . Personal history of adenomatous colonic polyps 07/20/2012    3 + adenomas 2009 07/20/2012 2 diminutive polyps      Past Surgical History  Procedure Laterality Date  . Inguinal hernia repair      left, x 3  . Inguinal hernia repair  2012    right  . Left nephrectomy  2000    Family History  Problem Relation Age of Onset  . Drug abuse Other   . Cancer Other   . Heart disease Other   . Lung disease Other   . Colon cancer Neg Hx   . Stomach cancer Neg Hx     Social History:  reports that he quit smoking about 12 months ago. He has never used smokeless tobacco. He reports that he drinks about 0.6 ounces of alcohol per week. He reports that he does not use illicit drugs.  Allergies: No Known Allergies  Medications: I have reviewed the patient's current medications.  Results for orders placed during the hospital encounter of 07/25/13 (from the past 48 hour(s))  BASIC METABOLIC PANEL     Status: Abnormal   Collection Time    07/25/13  3:32 PM      Result Value Range   Sodium 137  135 - 145 mEq/L   Potassium 4.3  3.5 - 5.1 mEq/L   Chloride 99   96 - 112 mEq/L   CO2 25  19 - 32 mEq/L   Glucose, Bld 187 (*) 70 - 99 mg/dL   BUN 16  6 - 23 mg/dL   Creatinine, Ser 1.61  0.50 - 1.35 mg/dL   Calcium 9.3  8.4 - 09.6 mg/dL   GFR calc non Af Amer 59 (*) >90 mL/min   GFR calc Af Amer 69 (*) >90 mL/min   Comment: (NOTE)     The eGFR has been calculated using the CKD EPI equation.     This calculation has not been validated in all clinical situations.     eGFR's persistently <90 mL/min signify possible Chronic Kidney     Disease.  CBC WITH DIFFERENTIAL     Status: Abnormal   Collection Time    07/25/13  3:32 PM      Result Value Range   WBC 8.7  4.0 - 10.5 K/uL   RBC 4.57  4.22 - 5.81 MIL/uL   Hemoglobin 14.0  13.0 - 17.0 g/dL   HCT 04.5  40.9 - 81.1 %  MCV 88.2  78.0 - 100.0 fL   MCH 30.6  26.0 - 34.0 pg   MCHC 34.7  30.0 - 36.0 g/dL   RDW 16.1  09.6 - 04.5 %   Platelets 220  150 - 400 K/uL   Neutrophils Relative % 78 (*) 43 - 77 %   Neutro Abs 6.8  1.7 - 7.7 K/uL   Lymphocytes Relative 14  12 - 46 %   Lymphs Abs 1.2  0.7 - 4.0 K/uL   Monocytes Relative 6  3 - 12 %   Monocytes Absolute 0.5  0.1 - 1.0 K/uL   Eosinophils Relative 2  0 - 5 %   Eosinophils Absolute 0.1  0.0 - 0.7 K/uL   Basophils Relative 1  0 - 1 %   Basophils Absolute 0.0  0.0 - 0.1 K/uL  PROTIME-INR     Status: None   Collection Time    07/25/13  3:32 PM      Result Value Range   Prothrombin Time 13.3  11.6 - 15.2 seconds   INR 1.03  0.00 - 1.49  TYPE AND SCREEN     Status: None   Collection Time    07/25/13  3:45 PM      Result Value Range   ABO/RH(D) A POS     Antibody Screen NEG     Sample Expiration 07/28/2013    ABO/RH     Status: None   Collection Time    07/25/13  3:45 PM      Result Value Range   ABO/RH(D) A POS      Dg Chest 1 View  07/25/2013   CLINICAL DATA:  Right hip fracture  EXAM: CHEST - 1 VIEW  COMPARISON:  None.  FINDINGS: Artifact overlies chest. Heart size is normal. Mediastinal shadows are normal. The lungs are clear. No  effusions. No bony abnormalities.  IMPRESSION: No active disease   Electronically Signed   By: Paulina Fusi M.D.   On: 07/25/2013 16:34   Dg Hip Complete Right  07/25/2013   CLINICAL DATA:  Larey Seat.  Pain.  EXAM: RIGHT HIP - COMPLETE 2+ VIEW  COMPARISON:  None.  FINDINGS: There is an inter trochanteric and sub trochanteric fracture of the femur with extension into the femoral neck. Separation of the main fragments measures approximately 2 cm. No fracture of the bones of the pelvis. Left proximal femur appears normal as seen.  IMPRESSION: Fracture in the intertrochanteric and sub trochanteric region of the right femur with extension into the femoral neck. Maximum separation of fragments inferiorly is 2 cm. Somewhat unusual fracture pattern.   Electronically Signed   By: Paulina Fusi M.D.   On: 07/25/2013 16:34    Review of Systems  Constitutional: Negative.   HENT: Negative.   Eyes: Negative.   Respiratory: Negative.   Cardiovascular: Negative.   Gastrointestinal: Negative.   Genitourinary: Negative.   Musculoskeletal: Positive for falls and joint pain. Negative for back pain, myalgias and neck pain.       Right hip pain  Skin: Negative.   Neurological: Negative.   Endo/Heme/Allergies: Negative.   Psychiatric/Behavioral: Negative.    Blood pressure 137/81, pulse 94, temperature 97.7 F (36.5 C), temperature source Oral, resp. rate 15, height 6' (1.829 m), weight 113.399 kg (250 lb), SpO2 99.00%. Physical Exam  Constitutional: He is oriented to person, place, and time. He appears well-developed and well-nourished. No distress.  HENT:  Head: Normocephalic and atraumatic.  Right Ear: External ear normal.  Left Ear: External ear normal.  Nose: Nose normal.  Mouth/Throat: Oropharynx is clear and moist.  Eyes: EOM are normal.  Neck: Normal range of motion. Neck supple.  Cardiovascular: Normal rate, regular rhythm, normal heart sounds and intact distal pulses.   No murmur heard. Respiratory:  Effort normal and breath sounds normal. No respiratory distress. He has no wheezes.  GI: Soft. Bowel sounds are normal. He exhibits no distension. There is no tenderness.  Neurological: He is alert and oriented to person, place, and time. He has normal strength. No sensory deficit.  Skin: He is not diaphoretic.  Psychiatric: He has a normal mood and affect. His behavior is normal.   He is laying in bed fairly comfortable but pain with movement of hi right leg.  No significant deformity involving his right leg,  NVI  Assessment/Plan: Right hip, subtrochanteric femur fracture Plan for ORIF right hip on Monday 11/17. NPO after midnight. NWB right LE.   Reviewed the expected post operative course depending on how well he tolerates the initial post-operative period Risks, benefits, and necessity of procedure outlined, consent to be obtained  CONSTABLE, AMBER LAUREN 07/25/2013, 8:12 PM

## 2013-07-25 NOTE — ED Notes (Signed)
MD Hospitalist at bedside.

## 2013-07-25 NOTE — ED Provider Notes (Addendum)
CSN: [id number]     Arrival date & time 07/25/13  1510 History   First MD Initiated Contact with Patient 07/25/13 1512     Chief Complaint  Patient presents with  . Fall  . Hip Pain    HPI Comments: Patient was using a leaf blower.   He stumbled backwards when he tripped over a flower pot. Patient fell to the ground and experienced significant pain in his right hip and pelvis. He was unable to stand up and EMS was contacted. She was given fentanyl by EMS with improvement in in his pain however he did experience an episode of vomiting after that. Patient denies any headache. He did not get knocked out. He denies any neck pain or any other pain elsewhere.  Patient is a 72 y.o. male presenting with fall and hip pain. The history is provided by the patient.  Fall This is a new problem. The problem occurs constantly. Pertinent negatives include no chest pain, no abdominal pain, no headaches and no shortness of breath. Associated symptoms comments: No loc.  Hip Pain Pertinent negatives include no chest pain, no abdominal pain, no headaches and no shortness of breath.    Past Medical History  Diagnosis Date  . Hyperlipidemia   . Obesity   . Diabetes mellitus   . Hypertension   . Eczema   . Biceps muscle tear     right  . Hernia, ventral   . 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 AVM (arteriovenous malformation) of colon     2 - non-bleeding 2013  . Personal history of adenomatous colonic polyps 07/20/2012    3 + adenomas 2009 07/20/2012 2 diminutive polyps     Past Surgical History  Procedure Laterality Date  . Inguinal hernia repair      left, x 3  . Inguinal hernia repair  2012    right  . Left nephrectomy  2000   Family History  Problem Relation Age of Onset  . Drug abuse Other   . Cancer Other   . Heart disease Other   . Lung disease Other   . Colon cancer Neg Hx   . Stomach cancer Neg Hx    History  Substance Use Topics  . Smoking status: Former Smoker     Quit date: 07/06/2012  . Smokeless tobacco: Never Used  . Alcohol Use: 0.6 oz/week    1 Cans of beer per week    Review of Systems  Respiratory: Negative for shortness of breath.   Cardiovascular: Negative for chest pain.  Gastrointestinal: Negative for abdominal pain.  Neurological: Negative for headaches.  All other systems reviewed and are negative.    Allergies  Review of patient's allergies indicates no known allergies.  Home Medications   Current Outpatient Rx  Name  Route  Sig  Dispense  Refill  . aspirin 81 MG tablet   Oral   Take 81 mg by mouth daily. Monday-Friday         . Cyanocobalamin (VITAMIN B 12 PO)   Oral   Take 1 tablet by mouth daily.         Marland Kitchen EPINEPHrine (EPIPEN 2-PAK) 0.3 mg/0.3 mL DEVI   Intramuscular   Inject 0.3 mg into the muscle once.           Marland Kitchen glipiZIDE (GLUCOTROL) 10 MG tablet      1 by mouth twice a day   200 tablet   3   .  lisinopril (PRINIVIL,ZESTRIL) 40 MG tablet   Oral   Take 40 mg by mouth daily.         . metFORMIN (GLUMETZA) 1000 MG (MOD) 24 hr tablet   Oral   Take 1,000 mg by mouth 2 (two) times daily.         . Multiple Vitamin (MULTIVITAMIN) tablet   Oral   Take 1 tablet by mouth daily.   100 tablet   3   . Multiple Vitamins-Minerals (EYE VITAMINS) CAPS   Oral   Take by mouth.          BP 137/81  Pulse 94  Temp(Src) 97.7 F (36.5 C) (Oral)  Resp 15  Ht 6' (1.829 m)  Wt 250 lb (113.399 kg)  BMI 33.90 kg/m2  SpO2 99% Physical Exam  Nursing note and vitals reviewed. Constitutional: He appears well-developed and well-nourished. No distress.  HENT:  Head: Normocephalic and atraumatic.  Right Ear: External ear normal.  Left Ear: External ear normal.  Eyes: Conjunctivae are normal. Right eye exhibits no discharge. Left eye exhibits no discharge. No scleral icterus.  Neck: Neck supple. No tracheal deviation present.  Cardiovascular: Normal rate, regular rhythm and intact distal pulses.    Pulmonary/Chest: Effort normal and breath sounds normal. No stridor. No respiratory distress. He has no wheezes. He has no rales.  Abdominal: Soft. Bowel sounds are normal. He exhibits no distension. There is no tenderness. There is no rebound and no guarding.  Musculoskeletal: He exhibits no edema.       Right shoulder: He exhibits tenderness.       Right hip: He exhibits tenderness, bony tenderness and deformity. He exhibits no swelling and no crepitus.  Shortened right lower extremity, externally rotated  Neurological: He is alert. He has normal strength. No sensory deficit. Cranial nerve deficit:  no gross defecits noted. He exhibits normal muscle tone. He displays no seizure activity. Coordination normal.  Skin: Skin is warm and dry. No rash noted.  Psychiatric: He has a normal mood and affect.    ED Course  Procedures (including critical care time) Labs Review Labs Reviewed  BASIC METABOLIC PANEL - Abnormal; Notable for the following:    Glucose, Bld 187 (*)    GFR calc non Af Amer 59 (*)    GFR calc Af Amer 69 (*)    All other components within normal limits  CBC WITH DIFFERENTIAL - Abnormal; Notable for the following:    Neutrophils Relative % 78 (*)    All other components within normal limits  PROTIME-INR  TYPE AND SCREEN  ABO/RH   Imaging Review Dg Chest 1 View  07/25/2013   CLINICAL DATA:  Right hip fracture  EXAM: CHEST - 1 VIEW  COMPARISON:  None.  FINDINGS: Artifact overlies chest. Heart size is normal. Mediastinal shadows are normal. The lungs are clear. No effusions. No bony abnormalities.  IMPRESSION: No active disease   Electronically Signed   By: Paulina Fusi M.D.   On: 07/25/2013 16:34   Dg Hip Complete Right  07/25/2013   CLINICAL DATA:  Larey Seat.  Pain.  EXAM: RIGHT HIP - COMPLETE 2+ VIEW  COMPARISON:  None.  FINDINGS: There is an inter trochanteric and sub trochanteric fracture of the femur with extension into the femoral neck. Separation of the main fragments  measures approximately 2 cm. No fracture of the bones of the pelvis. Left proximal femur appears normal as seen.  IMPRESSION: Fracture in the intertrochanteric and sub trochanteric region of the  right femur with extension into the femoral neck. Maximum separation of fragments inferiorly is 2 cm. Somewhat unusual fracture pattern.   Electronically Signed   By: Paulina Fusi M.D.   On: 07/25/2013 16:34    EKG Interpretation     Ventricular Rate:  98 PR Interval:  181 QRS Duration: 94 QT Interval:  357 QTC Calculation: 456 R Axis:   93 Text Interpretation:  Sinus tachycardia Atrial premature complexes Right axis deviation Since last tracing rate faster           Medications  ondansetron (ZOFRAN) injection 4 mg (0 mg Intravenous Hold 07/25/13 1540)  HYDROmorphone (DILAUDID) injection 0.5 mg (not administered)  0.9 %  sodium chloride infusion (1,000 mLs Intravenous New Bag/Given 07/25/13 1540)    MDM   1. Hip fracture, right, closed, initial encounter    Patient has an intertrochanteric right hip fracture. He limited to the medical service. Consult with orthopedics.  Discussed the findings with the patient and the need for surgery to correct his fracture.    Celene Kras, MD 07/25/13 1743 D/w Dr Rito Ehrlich who will admit the patient. D/w Dr Charlann Boxer.  Will see patient.  Not certain if he will do surgery tonight but NPO for now.  Celene Kras, MD 07/25/13 [phone]

## 2013-07-25 NOTE — ED Notes (Signed)
Dr. Knapp at the bedside.  

## 2013-07-26 ENCOUNTER — Inpatient Hospital Stay (HOSPITAL_COMMUNITY): Payer: Medicare Other | Admitting: Certified Registered"

## 2013-07-26 ENCOUNTER — Inpatient Hospital Stay (HOSPITAL_COMMUNITY): Payer: Medicare Other

## 2013-07-26 ENCOUNTER — Encounter (HOSPITAL_COMMUNITY)
Admission: EM | Disposition: A | Discharge status: Skilled Nursing Facility | Payer: Self-pay | Source: Home / Self Care | Attending: Internal Medicine

## 2013-07-26 ENCOUNTER — Encounter (HOSPITAL_COMMUNITY): Payer: Medicare Other | Admitting: Certified Registered"

## 2013-07-26 ENCOUNTER — Encounter (HOSPITAL_COMMUNITY): Payer: Self-pay | Admitting: Certified Registered Nurse Anesthetist

## 2013-07-26 DIAGNOSIS — Z905 Acquired absence of kidney: Secondary | ICD-10-CM

## 2013-07-26 HISTORY — PX: FEMUR IM NAIL: SHX1597

## 2013-07-26 LAB — GLUCOSE, CAPILLARY
Glucose-Capillary: 148 mg/dL — ABNORMAL HIGH (ref 70–99)
Glucose-Capillary: 122 mg/dL — ABNORMAL HIGH (ref 70–99)

## 2013-07-26 LAB — BASIC METABOLIC PANEL
CO2: 26 mEq/L (ref 19–32)
Calcium: 8.7 mg/dL (ref 8.4–10.5)
Creatinine, Ser: 1.18 mg/dL (ref 0.50–1.35)
GFR calc Af Amer: 69 mL/min — ABNORMAL LOW (ref >90)
Glucose, Bld: 145 mg/dL — ABNORMAL HIGH (ref 70–99)

## 2013-07-26 LAB — CBC
MCH: 30.4 pg (ref 26.0–34.0)
MCV: 88.6 fL (ref 78.0–100.0)
Platelets: 203 10*3/uL (ref 150–400)
RBC: 4.21 MIL/uL — ABNORMAL LOW (ref 4.22–5.81)

## 2013-07-26 LAB — ABO/RH: ABO/RH(D): A POS

## 2013-07-26 SURGERY — INSERTION, INTRAMEDULLARY ROD, FEMUR
Anesthesia: General | Laterality: Right | Wound class: Clean

## 2013-07-26 MED ORDER — OXYCODONE HCL 5 MG/5ML PO SOLN
5.0000 mg | Freq: Once | ORAL | Status: AC | PRN
Start: 2013-07-26 — End: 2013-07-26

## 2013-07-26 MED ORDER — LACTATED RINGERS IV SOLN
INTRAVENOUS | Status: DC | PRN
  Administered 2013-07-26: 22:00:00 via INTRAVENOUS

## 2013-07-26 MED ORDER — EPHEDRINE SULFATE 50 MG/ML IJ SOLN
INTRAMUSCULAR | Status: DC | PRN
  Administered 2013-07-26: 10 mg via INTRAVENOUS

## 2013-07-26 MED ORDER — MIDAZOLAM HCL 5 MG/5ML IJ SOLN
INTRAMUSCULAR | Status: DC | PRN
  Administered 2013-07-26: 2 mg via INTRAVENOUS

## 2013-07-26 MED ORDER — INSULIN ASPART 100 UNIT/ML ~~LOC~~ SOLN
0.0000 [IU] | Freq: Three times a day (TID) | SUBCUTANEOUS | Status: DC
  Administered 2013-07-26: 1 [IU] via SUBCUTANEOUS
  Administered 2013-07-27: 7 [IU] via SUBCUTANEOUS
  Administered 2013-07-27: 3 [IU] via SUBCUTANEOUS
  Administered 2013-07-27: 2 [IU] via SUBCUTANEOUS
  Administered 2013-07-28 (×2): 1 [IU] via SUBCUTANEOUS
  Administered 2013-07-28 – 2013-07-29 (×2): 2 [IU] via SUBCUTANEOUS
  Administered 2013-07-29: 1 [IU] via SUBCUTANEOUS

## 2013-07-26 MED ORDER — PROMETHAZINE HCL 25 MG/ML IJ SOLN
6.2500 mg | INTRAMUSCULAR | Status: DC | PRN
Start: 2013-07-26 — End: 2013-07-27

## 2013-07-26 MED ORDER — OXYCODONE HCL 5 MG PO TABS: 5.0000 mg | ORAL_TABLET | Freq: Once | ORAL | Status: AC | PRN

## 2013-07-26 MED ORDER — SODIUM CHLORIDE 0.9 % IV SOLN
1000.0000 mL | INTRAVENOUS | Status: DC
  Administered 2013-07-27: 1000 mL via INTRAVENOUS

## 2013-07-26 MED ORDER — FENTANYL CITRATE 0.05 MG/ML IJ SOLN: 50.0000 ug | Freq: Once | INTRAMUSCULAR | Status: DC

## 2013-07-26 MED ORDER — FENTANYL CITRATE 0.05 MG/ML IJ SOLN
INTRAMUSCULAR | Status: DC | PRN
  Administered 2013-07-26: 25 ug via INTRAVENOUS
  Administered 2013-07-26 (×2): 50 ug via INTRAVENOUS
  Administered 2013-07-26: 125 ug via INTRAVENOUS

## 2013-07-26 MED ORDER — LIDOCAINE HCL (CARDIAC) 20 MG/ML IV SOLN
INTRAVENOUS | Status: DC | PRN
  Administered 2013-07-26: 100 mg via INTRAVENOUS

## 2013-07-26 MED ORDER — HYDROMORPHONE HCL PF 1 MG/ML IJ SOLN
0.2500 mg | INTRAMUSCULAR | Status: DC | PRN
  Administered 2013-07-26: 0.5 mg via INTRAVENOUS

## 2013-07-26 MED ORDER — PHENYLEPHRINE HCL 10 MG/ML IJ SOLN
INTRAMUSCULAR | Status: DC | PRN
  Administered 2013-07-26 (×2): 80 ug via INTRAVENOUS
  Administered 2013-07-26: 160 ug via INTRAVENOUS

## 2013-07-26 MED ORDER — GLYCOPYRROLATE 0.2 MG/ML IJ SOLN
INTRAMUSCULAR | Status: DC | PRN
  Administered 2013-07-26: 0.6 mg via INTRAVENOUS

## 2013-07-26 MED ORDER — CEFAZOLIN SODIUM-DEXTROSE 2-3 GM-% IV SOLR
INTRAVENOUS | Status: AC
  Administered 2013-07-26: 2 g via INTRAVENOUS
  Filled 2013-07-26: qty 50

## 2013-07-26 MED ORDER — ONDANSETRON HCL 4 MG/2ML IJ SOLN
INTRAMUSCULAR | Status: DC | PRN
  Administered 2013-07-26: 4 mg via INTRAVENOUS

## 2013-07-26 MED ORDER — ROCURONIUM BROMIDE 100 MG/10ML IV SOLN
INTRAVENOUS | Status: DC | PRN
  Administered 2013-07-26: 50 mg via INTRAVENOUS

## 2013-07-26 MED ORDER — HYDROMORPHONE HCL PF 1 MG/ML IJ SOLN
INTRAMUSCULAR | Status: AC
Start: 2013-07-26 — End: 2013-07-27
  Filled 2013-07-26: qty 1

## 2013-07-26 MED ORDER — 0.9 % SODIUM CHLORIDE (POUR BTL) OPTIME
TOPICAL | Status: DC | PRN
  Administered 2013-07-26: 1000 mL

## 2013-07-26 MED ORDER — NEOSTIGMINE METHYLSULFATE 1 MG/ML IJ SOLN
INTRAMUSCULAR | Status: DC | PRN
  Administered 2013-07-26: 4 mg via INTRAVENOUS

## 2013-07-26 MED ORDER — PROPOFOL 10 MG/ML IV BOLUS
INTRAVENOUS | Status: DC | PRN
  Administered 2013-07-26: 165 mg via INTRAVENOUS

## 2013-07-26 MED ORDER — MIDAZOLAM HCL 2 MG/2ML IJ SOLN: 1.0000 mg | INTRAMUSCULAR | Status: DC | PRN

## 2013-07-26 SURGICAL SUPPLY — 58 items
ADH SKN CLS LQ APL DERMABOND (GAUZE/BANDAGES/DRESSINGS) ×1
BANDAGE GAUZE ELAST BULKY 4 IN (GAUZE/BANDAGES/DRESSINGS) ×2 IMPLANT
BIT DRILL 4.3MMS DISTAL GRDTED (BIT) ×1 IMPLANT
BIT DRILL 5.3 (BIT) ×2 IMPLANT
BIT DRILL 6.5X4.8 (BIT) ×2 IMPLANT
BNDG COHESIVE 6X5 TAN STRL LF (GAUZE/BANDAGES/DRESSINGS) ×2 IMPLANT
CLOTH BEACON ORANGE TIMEOUT ST (SAFETY) ×2 IMPLANT
CORTICAL BONE SCR 5.0MM X 46MM (Screw) ×2 IMPLANT
COVER MAYO STAND STRL (DRAPES) ×2 IMPLANT
DERMABOND ADHESIVE PROPEN (GAUZE/BANDAGES/DRESSINGS) ×1
DERMABOND ADVANCED .7 DNX6 (GAUZE/BANDAGES/DRESSINGS) ×1 IMPLANT
DRAPE INCISE IOBAN 66X45 STRL (DRAPES) ×2 IMPLANT
DRAPE STERI IOBAN 125X83 (DRAPES) ×2 IMPLANT
DRAPE SURG 17X23 STRL (DRAPES) ×2 IMPLANT
DRILL 4.3MMS DISTAL GRADUATED (BIT) ×2
DRSG ADAPTIC 3X8 NADH LF (GAUZE/BANDAGES/DRESSINGS) ×2 IMPLANT
DRSG MEPILEX BORDER 4X12 (GAUZE/BANDAGES/DRESSINGS) ×2 IMPLANT
DRSG MEPILEX BORDER 4X4 (GAUZE/BANDAGES/DRESSINGS) ×4 IMPLANT
DRSG MEPILEX BORDER 4X8 (GAUZE/BANDAGES/DRESSINGS) ×4 IMPLANT
DRSG PAD ABDOMINAL 8X10 ST (GAUZE/BANDAGES/DRESSINGS) ×2 IMPLANT
DURAPREP 26ML APPLICATOR (WOUND CARE) ×2 IMPLANT
ELECT REM PT RETURN 9FT ADLT (ELECTROSURGICAL) ×2
ELECTRODE REM PT RTRN 9FT ADLT (ELECTROSURGICAL) ×1 IMPLANT
EVACUATOR 1/8 PVC DRAIN (DRAIN) IMPLANT
GLOVE BIOGEL PI IND STRL 6.5 (GLOVE) ×1 IMPLANT
GLOVE BIOGEL PI IND STRL 7.5 (GLOVE) ×2 IMPLANT
GLOVE BIOGEL PI IND STRL 8 (GLOVE) ×2 IMPLANT
GLOVE BIOGEL PI INDICATOR 6.5 (GLOVE) ×1
GLOVE BIOGEL PI INDICATOR 7.5 (GLOVE) ×2
GLOVE BIOGEL PI INDICATOR 8 (GLOVE) ×2
GLOVE ORTHO TXT STRL SZ7.5 (GLOVE) ×4 IMPLANT
GLOVE SS N UNI LF 8.0 STRL (GLOVE) ×2 IMPLANT
GLOVE SURG ORTHO 8.0 STRL STRW (GLOVE) ×4 IMPLANT
GOWN STRL NON-REIN LRG LVL3 (GOWN DISPOSABLE) ×6 IMPLANT
GUIDEPIN 3.2X17.5 THRD DISP (PIN) ×2 IMPLANT
KIT BASIN OR (CUSTOM PROCEDURE TRAY) ×2 IMPLANT
KIT ROOM TURNOVER OR (KITS) ×4 IMPLANT
MANIFOLD NEPTUNE II (INSTRUMENTS) ×2 IMPLANT
NAIL TROCH ENTRY RT 13MM 42CM (Nail) ×2 IMPLANT
NS IRRIG 1000ML POUR BTL (IV SOLUTION) ×2 IMPLANT
PACK GENERAL/GYN (CUSTOM PROCEDURE TRAY) ×2 IMPLANT
PAD ARMBOARD 7.5X6 YLW CONV (MISCELLANEOUS) ×4 IMPLANT
SCREW ACE CORTICAL (Screw) ×2 IMPLANT
SCREW CORTICL BON 5.0MM X 46MM (Screw) ×1 IMPLANT
SCREW LAG 6.5MMX95MM (Screw) ×2 IMPLANT
SCREWDRIVER HEX TIP 3.5MM (MISCELLANEOUS) ×2 IMPLANT
SPONGE GAUZE 4X4 12PLY (GAUZE/BANDAGES/DRESSINGS) ×2 IMPLANT
STAPLER VISISTAT 35W (STAPLE) ×2 IMPLANT
SUT MNCRL AB 3-0 PS2 18 (SUTURE) ×4 IMPLANT
SUT VIC AB 0 CT1 27 (SUTURE) ×4
SUT VIC AB 0 CT1 27XBRD ANBCTR (SUTURE) ×2 IMPLANT
SUT VIC AB 1 CT1 27 (SUTURE) ×2
SUT VIC AB 1 CT1 27XBRD ANBCTR (SUTURE) ×1 IMPLANT
SUT VIC AB 2-0 CT1 27 (SUTURE) ×4
SUT VIC AB 2-0 CT1 TAPERPNT 27 (SUTURE) ×2 IMPLANT
TOWEL OR 17X24 6PK STRL BLUE (TOWEL DISPOSABLE) ×2 IMPLANT
TOWEL OR 17X26 10 PK STRL BLUE (TOWEL DISPOSABLE) ×2 IMPLANT
WATER STERILE IRR 1000ML POUR (IV SOLUTION) ×2 IMPLANT

## 2013-07-26 NOTE — Preoperative (Signed)
Beta Blockers   Reason not to administer Beta Blockers:Not Applicable 

## 2013-07-26 NOTE — Progress Notes (Signed)
Orthopedic Tech Progress Note Patient Details:  Charles [name] 11-May-1941 [id number] OHF applied to bed Patient ID: Charles [name], male   DOB: February 27, 1941, 72 y.o.   MRN: [id number]   [name] 07/26/2013, 1:04 PM

## 2013-07-26 NOTE — Anesthesia Preprocedure Evaluation (Addendum)
Anesthesia Evaluation  Patient identified by MRN, date of birth, ID band Patient awake    Reviewed: Allergy & Precautions, H&P , NPO status , Patient's Chart, lab work & pertinent test results  Airway Mallampati: II TM Distance: >3 FB Neck ROM: Full    Dental  (+) Teeth Intact and Dental Advisory Given   Pulmonary former smoker,  breath sounds clear to auscultation        Cardiovascular hypertension, Rhythm:Regular Rate:Normal     Neuro/Psych    GI/Hepatic   Endo/Other  diabetes  Renal/GU      Musculoskeletal   Abdominal (+) + obese,   Peds  Hematology   Anesthesia Other Findings   Reproductive/Obstetrics                          Anesthesia Physical Anesthesia Plan  ASA: III  Anesthesia Plan: General   Post-op Pain Management:    Induction: Intravenous  Airway Management Planned: Oral ETT  Additional Equipment:   Intra-op Plan:   Post-operative Plan: Extubation in OR  Informed Consent: I have reviewed the patients History and Physical, chart, labs and discussed the procedure including the risks, benefits and alternatives for the proposed anesthesia with the patient or authorized representative who has indicated his/her understanding and acceptance.     Plan Discussed with: CRNA and Surgeon  Anesthesia Plan Comments:         Anesthesia Quick Evaluation

## 2013-07-26 NOTE — Progress Notes (Signed)
Utilization review complete. Mackson Botz RN CCM Case Mgmt phone [phone] 

## 2013-07-26 NOTE — Anesthesia Procedure Notes (Signed)
Procedure Name: Intubation Date/Time: 07/26/2013 9:50 PM Performed by: Orvilla Fus A Pre-anesthesia Checklist: Patient identified, Timeout performed, Emergency Drugs available, Suction available and Patient being monitored Patient Re-evaluated:Patient Re-evaluated prior to inductionOxygen Delivery Method: Circle system utilized Preoxygenation: Pre-oxygenation with 100% oxygen Intubation Type: IV induction Ventilation: Mask ventilation without difficulty and Oral airway inserted - appropriate to patient size Laryngoscope Size: Mac and 4 Grade View: Grade II Tube type: Oral Tube size: 7.5 mm Number of attempts: 1 Airway Equipment and Method: Stylet Placement Confirmation: ETT inserted through vocal cords under direct vision,  breath sounds checked- equal and bilateral and positive ETCO2 Secured at: 23 cm Tube secured with: Tape Dental Injury: Teeth and Oropharynx as per pre-operative assessment

## 2013-07-26 NOTE — Brief Op Note (Signed)
07/25/2013 - 07/26/2013  9:39 PM  PATIENT:  Charles [name]  72 y.o. male  PRE-OPERATIVE DIAGNOSIS:  right subtrochanteric femur fracture   POST-OPERATIVE DIAGNOSIS:  right subtrochanteric femur fracture   PROCEDURE:  Procedure(s): INTRAMEDULLARY (IM) NAIL FEMORAL subtrochanteric (Right), ORIF right femur  SURGEON:  Surgeon(s) and Role:    * Shelda Pal, MD - Primary  PHYSICIAN ASSISTANT: No  ANESTHESIA:   general  EBL:     BLOOD ADMINISTERED:none  DRAINS: none   LOCAL MEDICATIONS USED:  NONE  SPECIMEN:  No Specimen  DISPOSITION OF SPECIMEN:  N/A  COUNTS:  YES  TOURNIQUET:  * No tourniquets in log *  DICTATION: .Other Dictation: Dictation Number [id number]  PLAN OF CARE: Admit to inpatient   PATIENT DISPOSITION:  PACU - hemodynamically stable.   Delay start of Pharmacological VTE agent (>24hrs) due to surgical blood loss or risk of bleeding: no

## 2013-07-26 NOTE — Progress Notes (Signed)
[facility] PROGRESS NOTE  Charles [name] ZOX:[id number] DOB: Jan 08, 1941 DOA: 07/25/2013 PCP: Charles Georges, MD  Assessment/Plan: 1-Right side Hip fracture: For surgery today.  2-Diabetes: Hold oral hypoglycemic medications. Hold metformin while in he hospital. Start SSI.  3-Hypertension: holding lisinopril prior to surgery to avoid renal insufficiency.  4-History of Renal Cell Carcinoma, post nephrectomy: Follow renal function daily.   Code Status: Full Code.  Family Communication: care discussed with patient.  Disposition Plan: Remain in patient. To be determine.    Consultants:  Charles [name].   Procedures:  none  Antibiotics:  None.   HPI/Subjective: He denies chest pain or dyspnea on exertion.  No significant hip pain.   Objective: Filed Vitals:   07/26/13 0633  BP: 131/68  Pulse: 97  Temp: 97.9 F (36.6 C)  Resp: 16    Intake/Output Summary (Last 24 hours) at 07/26/13 0817 Last data filed at 07/26/13 8119  Gross per 24 hour  Intake 1156.67 ml  Output    500 ml  Net 656.67 ml   Filed Weights   07/25/13 1518  Weight: 113.399 kg (250 lb)    Exam:   General:  No Acute distress.   Cardiovascular: S 1, S 2 RRR  Respiratory: CTA  Abdomen: BS present, soft, NT  Musculoskeletal: no edema.   Data Reviewed: Basic Metabolic Panel:  Recent Labs Lab 07/25/13 1532 07/26/13 0615  NA 137 137  K 4.3 4.5  CL 99 99  CO2 25 26  GLUCOSE 187* 145*  BUN 16 18  CREATININE 1.19 1.18  CALCIUM 9.3 8.7   Liver Function Tests: No results found for this basename: AST, ALT, ALKPHOS, BILITOT, PROT, ALBUMIN,  in the last 168 hours No results found for this basename: LIPASE, AMYLASE,  in the last 168 hours No results found for this basename: AMMONIA,  in the last 168 hours CBC:  Recent Labs Lab 07/25/13 1532 07/26/13 0615  WBC 8.7 7.6  NEUTROABS 6.8  --   HGB 14.0 12.8*  HCT 40.3 37.3*  MCV 88.2 88.6  PLT 220 203   Cardiac Enzymes: No  results found for this basename: CKTOTAL, CKMB, CKMBINDEX, TROPONINI,  in the last 168 hours BNP (last 3 results) No results found for this basename: PROBNP,  in the last 8760 hours CBG:  Recent Labs Lab 07/25/13 2147 07/26/13 0628  GLUCAP 214* 154*    Recent Results (from the past 240 hour(s))  MRSA PCR SCREENING     Status: None   Collection Time    07/26/13  6:10 AM      Result Value Range Status   MRSA by PCR NEGATIVE  NEGATIVE Final   Comment:            The GeneXpert MRSA Assay (FDA     approved for NASAL specimens     only), is one component of a     comprehensive MRSA colonization     surveillance program. It is not     intended to diagnose MRSA     infection nor to guide or     monitor treatment for     MRSA infections.     Studies: Dg Chest 1 View  07/25/2013   CLINICAL DATA:  Right hip fracture  EXAM: CHEST - 1 VIEW  COMPARISON:  None.  FINDINGS: Artifact overlies chest. Heart size is normal. Mediastinal shadows are normal. The lungs are clear. No effusions. No bony abnormalities.  IMPRESSION: No active disease   Electronically Signed  By: Charles [name] M.D.   On: 07/25/2013 16:34   Dg Hip Complete Right  07/25/2013   CLINICAL DATA:  Larey Seat.  Pain.  EXAM: RIGHT HIP - COMPLETE 2+ VIEW  COMPARISON:  None.  FINDINGS: There is an inter trochanteric and sub trochanteric fracture of the femur with extension into the femoral neck. Separation of the main fragments measures approximately 2 cm. No fracture of the bones of the pelvis. Left proximal femur appears normal as seen.  IMPRESSION: Fracture in the intertrochanteric and sub trochanteric region of the right femur with extension into the femoral neck. Maximum separation of fragments inferiorly is 2 cm. Somewhat unusual fracture pattern.   Electronically Signed   By: Charles [name] M.D.   On: 07/25/2013 16:34    Scheduled Meds: . insulin aspart  0-9 Units Subcutaneous TID WC   Continuous Infusions: . sodium chloride 1,000  mL (07/25/13 1540)    Principal Problem:   Hip fracture, right Active Problems:   Type II diabetes mellitus with renal manifestations, uncontrolled   HYPERLIPIDEMIA   HYPERTENSION   S/p nephrectomy   Renal cell carcinoma    Time spent: 25 minutes.     Charles [name]  [facility] Pager [phone]. If 7PM-7AM, please contact night-coverage at [email or web address], password [facility] 07/26/2013, 8:17 AM  LOS: 1 day

## 2013-07-26 NOTE — Transfer of Care (Signed)
Immediate Anesthesia Transfer of Care Note  Patient: Charles [name]  Procedure(s) Performed: Procedure(s): INTRAMEDULLARY (IM) NAIL FEMORAL subtrochanteric (Right)  Patient Location: PACU  Anesthesia Type:General  Level of Consciousness: awake, alert  and oriented  Airway & Oxygen Therapy: Patient Spontanous Breathing and Patient connected to nasal cannula oxygen moving all extremities   Post-op Assessment: Report given to PACU RN and Post -op Vital signs reviewed and stable  Post vital signs: Reviewed and stable  Complications: No apparent anesthesia complications

## 2013-07-27 LAB — CBC
HCT: 33 % — ABNORMAL LOW (ref 39.0–52.0)
HCT: 31.8 % — ABNORMAL LOW (ref 39.0–52.0)
Hemoglobin: 10.7 g/dL — ABNORMAL LOW (ref 13.0–17.0)
MCH: 29.9 pg (ref 26.0–34.0)
MCHC: 33.6 g/dL (ref 30.0–36.0)
MCV: 88.8 fL (ref 78.0–100.0)
Platelets: 215 10*3/uL (ref 150–400)
RDW: 13.7 % (ref 11.5–15.5)
RDW: 13.6 % (ref 11.5–15.5)
WBC: 8 10*3/uL (ref 4.0–10.5)
WBC: 7.8 10*3/uL (ref 4.0–10.5)

## 2013-07-27 LAB — GLUCOSE, CAPILLARY
Glucose-Capillary: 156 mg/dL — ABNORMAL HIGH (ref 70–99)
Glucose-Capillary: 165 mg/dL — ABNORMAL HIGH (ref 70–99)

## 2013-07-27 LAB — BASIC METABOLIC PANEL
BUN: 14 mg/dL (ref 6–23)
CO2: 25 mEq/L (ref 19–32)
Chloride: 97 mEq/L (ref 96–112)
Creatinine, Ser: 1.22 mg/dL (ref 0.50–1.35)
GFR calc Af Amer: 67 mL/min — ABNORMAL LOW (ref >90)
GFR calc non Af Amer: 57 mL/min — ABNORMAL LOW (ref >90)

## 2013-07-27 MED ORDER — MORPHINE SULFATE 2 MG/ML IJ SOLN: 0.5000 mg | INTRAMUSCULAR | Status: DC | PRN

## 2013-07-27 MED ORDER — ONDANSETRON HCL 4 MG/2ML IJ SOLN: 4.0000 mg | Freq: Four times a day (QID) | INTRAMUSCULAR | Status: DC | PRN

## 2013-07-27 MED ORDER — ACETAMINOPHEN 325 MG PO TABS: 650.0000 mg | ORAL_TABLET | Freq: Four times a day (QID) | ORAL | Status: DC | PRN

## 2013-07-27 MED ORDER — SODIUM CHLORIDE 0.9 % IV SOLN
INTRAVENOUS | Status: DC
  Administered 2013-07-27 – 2013-07-28 (×2): via INTRAVENOUS
  Filled 2013-07-27 (×6): qty 1000

## 2013-07-27 MED ORDER — POLYETHYLENE GLYCOL 3350 17 G PO PACK: 17.0000 g | PACK | Freq: Every day | ORAL | Status: DC | PRN

## 2013-07-27 MED ORDER — FERROUS SULFATE 325 (65 FE) MG PO TABS
325.0000 mg | ORAL_TABLET | Freq: Three times a day (TID) | ORAL | Status: DC
  Administered 2013-07-27 – 2013-07-29 (×7): 325 mg via ORAL
  Filled 2013-07-27 (×10): qty 1

## 2013-07-27 MED ORDER — METOCLOPRAMIDE HCL 10 MG PO TABS: 5.0000 mg | ORAL_TABLET | Freq: Three times a day (TID) | ORAL | Status: DC | PRN

## 2013-07-27 MED ORDER — PHENOL 1.4 % MT LIQD: 1.0000 | OROMUCOSAL | Status: DC | PRN

## 2013-07-27 MED ORDER — MENTHOL 3 MG MT LOZG: 1.0000 | LOZENGE | OROMUCOSAL | Status: DC | PRN

## 2013-07-27 MED ORDER — ACETAMINOPHEN 650 MG RE SUPP: 650.0000 mg | Freq: Four times a day (QID) | RECTAL | Status: DC | PRN

## 2013-07-27 MED ORDER — SODIUM CHLORIDE 0.9 % IV SOLN
1000.0000 mL | INTRAVENOUS | Status: DC
  Administered 2013-07-27 – 2013-07-28 (×2): 1000 mL via INTRAVENOUS

## 2013-07-27 MED ORDER — HYDROMORPHONE HCL PF 1 MG/ML IJ SOLN: 0.5000 mg | INTRAMUSCULAR | Status: DC | PRN

## 2013-07-27 MED ORDER — METOCLOPRAMIDE HCL 5 MG/ML IJ SOLN: 5.0000 mg | Freq: Three times a day (TID) | INTRAMUSCULAR | Status: DC | PRN

## 2013-07-27 MED ORDER — ENOXAPARIN SODIUM 40 MG/0.4ML ~~LOC~~ SOLN
40.0000 mg | SUBCUTANEOUS | Status: DC
  Administered 2013-07-27 – 2013-07-29 (×3): 40 mg via SUBCUTANEOUS
  Filled 2013-07-27 (×3): qty 0.4

## 2013-07-27 MED ORDER — METHOCARBAMOL 100 MG/ML IJ SOLN: 500.0000 mg | Freq: Four times a day (QID) | INTRAVENOUS | Status: DC | PRN

## 2013-07-27 MED ORDER — METHOCARBAMOL 500 MG PO TABS
500.0000 mg | ORAL_TABLET | Freq: Four times a day (QID) | ORAL | Status: DC | PRN
  Administered 2013-07-27 – 2013-07-29 (×7): 500 mg via ORAL
  Filled 2013-07-27 (×7): qty 1

## 2013-07-27 MED ORDER — ONDANSETRON HCL 4 MG PO TABS: 4.0000 mg | ORAL_TABLET | Freq: Four times a day (QID) | ORAL | Status: DC | PRN

## 2013-07-27 MED ORDER — DOCUSATE SODIUM 100 MG PO CAPS
100.0000 mg | ORAL_CAPSULE | Freq: Two times a day (BID) | ORAL | Status: DC
  Administered 2013-07-27 – 2013-07-29 (×5): 100 mg via ORAL
  Filled 2013-07-27 (×6): qty 1

## 2013-07-27 MED ORDER — HYDROCODONE-ACETAMINOPHEN 5-325 MG PO TABS
1.0000 | ORAL_TABLET | Freq: Four times a day (QID) | ORAL | Status: DC | PRN
  Administered 2013-07-27 (×2): 2 via ORAL
  Administered 2013-07-28 (×2): 1 via ORAL
  Administered 2013-07-28 – 2013-07-29 (×3): 2 via ORAL
  Filled 2013-07-27: qty 2
  Filled 2013-07-27: qty 1
  Filled 2013-07-27 (×5): qty 2

## 2013-07-27 MED ORDER — SODIUM CHLORIDE 0.9 % IV SOLN: 1000.0000 mL | INTRAVENOUS | Status: DC

## 2013-07-27 MED ORDER — POLYETHYLENE GLYCOL 3350 17 G PO PACK
17.0000 g | PACK | Freq: Two times a day (BID) | ORAL | Status: DC
  Administered 2013-07-27 – 2013-07-29 (×4): 17 g via ORAL
  Filled 2013-07-27 (×7): qty 1

## 2013-07-27 MED ORDER — CEFAZOLIN SODIUM-DEXTROSE 2-3 GM-% IV SOLR
2.0000 g | Freq: Four times a day (QID) | INTRAVENOUS | Status: AC
  Administered 2013-07-27 (×2): 2 g via INTRAVENOUS
  Filled 2013-07-27 (×2): qty 50

## 2013-07-27 NOTE — Progress Notes (Signed)
07/27/13 PT recommended SNF. Referral made to CSW. Will continue to follow for d/c needs. Jacquelynn Cree RN, BSN, CCM

## 2013-07-27 NOTE — Anesthesia Postprocedure Evaluation (Signed)
  Anesthesia Post-op Note  Patient: Charles [name]  Procedure(s) Performed: Procedure(s): INTRAMEDULLARY (IM) NAIL FEMORAL subtrochanteric (Right)  Patient Location: PACU  Anesthesia Type:General  Level of Consciousness: awake and alert   Airway and Oxygen Therapy: Patient Spontanous Breathing  Post-op Pain: mild  Post-op Assessment: Post-op Vital signs reviewed, Patient's Cardiovascular Status Stable, Respiratory Function Stable, Patent Airway, No signs of Nausea or vomiting and Pain level controlled  Post-op Vital Signs: Reviewed and stable  Complications: No apparent anesthesia complications

## 2013-07-27 NOTE — Evaluation (Signed)
Occupational Therapy Evaluation Patient Details Name: Charles [name] MRN: [id number] DOB: 1941-04-17 Today's Date: 07/27/2013 Time: 4098-1191 OT Time Calculation (min): 40 min  OT Assessment / Plan / Recommendation History of present illness Pt is a 72 yo male admitted for R femur ORIF after falling when blowing leaves.  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  Pt is 50% WB on RLE.   Clinical Impression   Pt admitted for the above diagnosis and has the deficits listed below.  Pt would benefit from cont OT to increase independence with basic adls so he can return home with his daughter who can assist him.    OT Assessment  Patient needs continued OT Services    Follow Up Recommendations  Home health OT;Supervision/Assistance - 24 hour;Other (comment) (if pt does not progress, may need SNF)    Barriers to Discharge Decreased caregiver support only has daughter at home to assist, but she is available 24/7.  Need to see how he progresses over the next 24 hours.  Equipment Recommendations  3 in 1 bedside comode    Recommendations for Other Services PT consult  Frequency  Min 3X/week    Precautions / Restrictions Precautions Precautions: Fall Restrictions Weight Bearing Restrictions: Yes RLE Weight Bearing: Partial weight bearing Other Position/Activity Restrictions: 50% WB in RLE   Pertinent Vitals/Pain Pt c/o 6/10 pain in hip with mobility.  Nursing gave pt meds before session started.  HR 120 w activity.  O2 sats between 94-97% w 3 L of O2.  When O2 removed, pt dropped to 89% so it was replaced.    ADL  Eating/Feeding: Performed;Independent Where Assessed - Eating/Feeding: Chair Grooming: Performed;Teeth care;Wash/dry face;Wash/dry hands;Set up Where Assessed - Grooming: Unsupported sitting Upper Body Bathing: Simulated;Set up Where Assessed - Upper Body Bathing: Supported sitting Lower Body Bathing: Simulated;Maximal assistance Where Assessed - Lower Body  Bathing: Supported sit to stand Upper Body Dressing: Performed;Set up Where Assessed - Upper Body Dressing: Supported sit to stand Lower Body Dressing: Simulated;Maximal assistance Where Assessed - Lower Body Dressing: Supported sit to stand Toilet Transfer: Performed;Moderate assistance Toilet Transfer Method: Stand pivot Toilet Transfer Equipment: Bedside commode Toileting - Clothing Manipulation and Hygiene: Performed;+1 Total assistance Where Assessed - Glass blower/designer Manipulation and Hygiene: Standing Equipment Used: Rolling walker Transfers/Ambulation Related to ADLs: Pt has very difficult time weight shifting onto the L leg so he can move the R leg.  Pt also had a hard time bearing weight through his arms to make it easier to move legs with walker. ADL Comments: Pt needed more assist with LE adls.  Pt unable to leg go of walker to do adls in standing yet.    OT Diagnosis: Generalized weakness;Acute pain  OT Problem List: Decreased strength;Decreased activity tolerance;Impaired balance (sitting and/or standing);Decreased knowledge of use of DME or AE;Decreased knowledge of precautions;Pain OT Treatment Interventions: Self-care/ADL training;DME and/or AE instruction;Therapeutic activities   OT Goals(Current goals can be found in the care plan section) Acute Rehab OT Goals Patient Stated Goal: to go home from here if possible. OT Goal Formulation: With patient Time For Goal Achievement: 08/10/13 Potential to Achieve Goals: Good ADL Goals Pt Will Perform Grooming: with supervision;standing Pt Will Perform Lower Body Bathing: with min assist;sit to/from stand Pt Will Perform Lower Body Dressing: with min assist;sit to/from stand;with adaptive equipment Pt Will Perform Tub/Shower Transfer: Shower transfer;with min assist;rolling walker;ambulating;shower seat Additional ADL Goal #1: Pt will toilet with 3:1 over commode with S.  Visit Information  Last OT Received On:  07/27/13 Assistance Needed: +2 History of Present Illness: Pt is a 72 yo male admitted for R femur ORIF after falling when blowing leaves.  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  Pt is 50% WB on RLE.       Prior Functioning     Home Living Family/patient expects to be discharged to:: Private residence Living Arrangements: Children Available Help at Discharge: Family;Available 24 hours/day Type of Home: House Home Access: Stairs to enter [employer] of Steps: 2 Home Layout: Two level;Able to live on main level with bedroom/bathroom Home Equipment: None Prior Function Level of Independence: Independent Communication Communication: No difficulties Dominant Hand: Right         Vision/Perception Vision - History Baseline Vision: No visual deficits Patient Visual Report: No change from baseline Vision - Assessment Vision Assessment: Vision not tested Perception Perception: Within Functional Limits Praxis Praxis: Intact   Cognition  Cognition Arousal/Alertness: Awake/alert Behavior During Therapy: WFL for tasks assessed/performed Overall Cognitive Status: Within Functional Limits for tasks assessed    Extremity/Trunk Assessment Upper Extremity Assessment Upper Extremity Assessment: Overall WFL for tasks assessed Lower Extremity Assessment Lower Extremity Assessment: Defer to PT evaluation Cervical / Trunk Assessment Cervical / Trunk Assessment: Normal     Mobility Bed Mobility Bed Mobility: Supine to Sit;Sitting - Scoot to Edge of Bed Supine to Sit: 2: Max assist;HOB elevated;With rails (HOB at 40 degrees) Sitting - Scoot to Edge of Bed: 4: Min assist;Other (comment) (w pad) Details for Bed Mobility Assistance: Pt requires full max assist to move R leg secondary to weakness and pain. Transfers Transfers: Sit to Stand;Stand to Sit Sit to Stand: 3: Mod assist;From bed;From elevated surface;With armrests;Other (comment) (cues for PWB) Stand  to Sit: 3: Mod assist;With armrests;To chair/3-in-1;Other (comment) (cues to sit slowly) Details for Transfer Assistance: Pt requires assist to move RLE during transfers.  Pt cannot weight shift all weight to L side to be able to move R side well.     Exercise     Balance Balance Balance Assessed: Yes Static Sitting Balance Static Sitting - Balance Support: Bilateral upper extremity supported;Feet supported Static Sitting - Level of Assistance: 5: Stand by assistance Static Sitting - Comment/# of Minutes: 3   End of Session OT - End of Session Equipment Utilized During Treatment: Rolling walker;Oxygen Activity Tolerance: Patient limited by pain Patient left: in chair;with call bell/phone within reach Nurse Communication: Mobility status;Weight bearing status  GO     Hope Budds 07/27/2013, 9:41 AM [phone]

## 2013-07-27 NOTE — Evaluation (Addendum)
Physical Therapy Evaluation Patient Details Name: Charles [name] MRN: [id number] DOB: March 03, 1941 Today's Date: 07/27/2013 Time: 1012-1040 PT Time Calculation (min): 28 min  PT Assessment / Plan / Recommendation History of Present Illness  Pt is a 72 yo male admitted for R femur ORIF after falling when blowing leaves.  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  Pt is 50% WB on RLE.  Clinical Impression  This patient presents with acute pain and decreased functional independence following the above mentioned procedure. At the time of PT eval, pt required +2 assist for transfers and was not able to take any steps. Pregait activity was performed with RW, and pt was able to advance feet to the right using shuffling technique. Pt wants to return home at d/c, however at this time pt and family agree that STR at a SNF would be a safer option at d/c due to high level of assist needed. This patient is appropriate for skilled PT interventions to address functional limitations, improve safety and independence with functional mobility, and return to PLOF.    PT Assessment  Patient needs continued PT services    Follow Up Recommendations  SNF    Does the patient have the potential to tolerate intense rehabilitation      Barriers to Discharge Decreased caregiver support      Equipment Recommendations  None recommended by PT    Recommendations for Other Services     Frequency Min 5X/week    Precautions / Restrictions Precautions Precautions: Fall Restrictions Weight Bearing Restrictions: Yes RLE Weight Bearing: Partial weight bearing RLE Partial Weight Bearing Percentage or Pounds: 50%   Pertinent Vitals/Pain 8/10 after session. Patient urged to call nursing for pain control but declines.      Mobility  Bed Mobility Bed Mobility: Not assessed Details for Bed Mobility Assistance: Pt received sitting in recliner Transfers Transfers: Sit to Stand;Stand to Sit Sit to Stand: 1:  +2 Total assist;From chair/3-in-1;With upper extremity assist Sit to Stand: Patient Percentage: 40% Stand to Sit: 1: +2 Total assist;To chair/3-in-1;With upper extremity assist Stand to Sit: Patient Percentage: 40% Details for Transfer Assistance: VC's for sequencing and safety awareness. Pt had difficulty standing from recliner due to low seat height. Pillow placed in seat to raise him up a bit. Cues for hand placement and foot placement prior to initiating transfers.  Ambulation/Gait Ambulation/Gait Assistance: 3: Mod assist Assistive device: Rolling walker Ambulation/Gait Assistance Details: Pt unable to take steps at this time but pt performed pre-gait activity such as sliding feet to position, and shuffling feet to the side when asked to side step. Gait Pattern: Shuffle Gait velocity: Very slow    Exercises     PT Diagnosis: Difficulty walking;Acute pain  PT Problem List: Decreased strength;Decreased range of motion;Decreased activity tolerance;Decreased balance;Decreased mobility;Decreased knowledge of use of DME;Decreased safety awareness;Pain PT Treatment Interventions: DME instruction;Gait training;Stair training;Functional mobility training;Therapeutic activities;Therapeutic exercise;Neuromuscular re-education;Patient/family education     PT Goals(Current goals can be found in the care plan section) Acute Rehab PT Goals Patient Stated Goal: to go home from here if possible. PT Goal Formulation: With patient/family Time For Goal Achievement: 08/03/13 Potential to Achieve Goals: Fair  Visit Information  Last PT Received On: 07/27/13 Assistance Needed: +2 History of Present Illness: Pt is a 72 yo male admitted for R femur ORIF after falling when blowing leaves.  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  Pt is 50% WB on  RLE.       Prior Functioning  Home Living Family/patient expects to be discharged to:: Skilled nursing facility Living Arrangements:  Children Available Help at Discharge: Family;Available 24 hours/day Type of Home: House Home Access: Stairs to enter [employer] of Steps: 3 Entrance Stairs-Rails: None Home Layout: Two level;Able to live on main level with bedroom/bathroom Home Equipment: None Prior Function Level of Independence: Independent Communication Communication: No difficulties Dominant Hand: Right    Cognition  Cognition Arousal/Alertness: Awake/alert Behavior During Therapy: WFL for tasks assessed/performed Overall Cognitive Status: Within Functional Limits for tasks assessed    Extremity/Trunk Assessment Upper Extremity Assessment Upper Extremity Assessment: Defer to OT evaluation Lower Extremity Assessment Lower Extremity Assessment: RLE deficits/detail RLE Deficits / Details: Decreased strength and AROM consistent with ORIF R femur Cervical / Trunk Assessment Cervical / Trunk Assessment: Normal   Balance Balance Balance Assessed: Yes Static Sitting Balance Static Sitting - Balance Support: Bilateral upper extremity supported;Feet supported Static Sitting - Level of Assistance: 5: Stand by assistance Static Sitting - Comment/# of Minutes: In chair prior to initiating stand Static Standing Balance Static Standing - Balance Support: Bilateral upper extremity supported Static Standing - Level of Assistance: 4: Min assist Static Standing - Comment/# of Minutes: 5 minutes while performing pre-gait activity.  End of Session PT - End of Session Equipment Utilized During Treatment: Gait belt Activity Tolerance: Patient limited by fatigue;Patient limited by pain Patient left: in chair;with call bell/phone within reach;with family/visitor present Nurse Communication: Mobility status  GP     Charles [name] 07/27/2013, 11:13 AM  Charles [name], PT, DPT [phone]

## 2013-07-27 NOTE — Progress Notes (Signed)
Patient ID: Charles [name], male   DOB: [birth year], 72 y.o.   MRN: [id number] Subjective: 1 Day Post-Op Procedure(s) (LRB): INTRAMEDULLARY (IM) NAIL FEMORAL subtrochanteric (Right)    Patient reports pain as mild.  Not much sleep last night so not great with PT this am. Eager to try some more though  Objective:   VITALS:   Filed Vitals:   07/27/13 0900  BP:   Pulse: 122  Temp:   Resp:     Neurovascular intact Incision: dressing C/D/I  LABS  Recent Labs  07/25/13 1532 07/26/13 0615 07/27/13 0640  HGB 14.0 12.8* 11.2*  HCT 40.3 37.3* 33.0*  WBC 8.7 7.6 8.0  PLT 220 203 215     Recent Labs  07/25/13 1532 07/26/13 0615 07/27/13 0640  NA 137 137 134*  K 4.3 4.5 4.4  BUN 16 18 14   CREATININE 1.19 1.18 1.22  GLUCOSE 187* 145* 178*     Recent Labs  07/25/13 1532  INR 1.03     Assessment/Plan: 1 Day Post-Op Procedure(s) (LRB): INTRAMEDULLARY (IM) NAIL FEMORAL subtrochanteric (Right)   Advance diet Up with therapy Discharge to SNF versus home depending on his activity level and safety as reviewed with patient and family  Will continue to follow

## 2013-07-27 NOTE — Progress Notes (Signed)
[facility] PROGRESS NOTE  Charles [name] UJW:[id number] DOB: April 22, 1941 DOA: 07/25/2013 PCP: Evette Georges, MD  Assessment/Plan: 1-Right  Hip fracture: Patient S/P Intramedullary nail  femoral subtrochanteric on 11-17. PT, OT per ortho. DVT prophylaxis: Lovenox.  miralax schedule for bowel regimen.   2-Diabetes: Hold oral hypoglycemic medications. Hold metformin while in he hospital. Continue with SSI.  3-Hypertension: holding lisinopril prior to surgery to avoid renal insufficiency. SBP in the 130.  4-History of Renal Cell Carcinoma, post nephrectomy: Follow renal function daily.  5-Tachycardia: asymptomatic. Continue with IV fluids. Check EKG. Repeat cbc tonight.   Code Status: Full Code.  Family Communication: care discussed with patient.  Disposition Plan: need SNF   Consultants:  Dr Charlann Boxer.   Procedures:  none  Antibiotics:  None.   HPI/Subjective: He denies chest pain or dyspnea on exertion.  No significant hip pain. Eating well. No BM.   Objective: Filed Vitals:   07/27/13 0900  BP:   Pulse: 122  Temp:   Resp:     Intake/Output Summary (Last 24 hours) at 07/27/13 1402 Last data filed at 07/27/13 1145  Gross per 24 hour  Intake   1858 ml  Output    875 ml  Net    983 ml   Filed Weights   07/25/13 1518  Weight: 113.399 kg (250 lb)    Exam:   General:  No Acute distress.   Cardiovascular: S 1, S 2 RRR  Respiratory: CTA  Abdomen: BS present, soft, NT  Musculoskeletal: no edema. Right hip with clean dressing.   Data Reviewed: Basic Metabolic Panel:  Recent Labs Lab 07/25/13 1532 07/26/13 0615 07/27/13 0640  NA 137 137 134*  K 4.3 4.5 4.4  CL 99 99 97  CO2 25 26 25   GLUCOSE 187* 145* 178*  BUN 16 18 14   CREATININE 1.19 1.18 1.22  CALCIUM 9.3 8.7 8.3*   Liver Function Tests: No results found for this basename: AST, ALT, ALKPHOS, BILITOT, PROT, ALBUMIN,  in the last 168 hours No results found for this basename: LIPASE,  AMYLASE,  in the last 168 hours No results found for this basename: AMMONIA,  in the last 168 hours CBC:  Recent Labs Lab 07/25/13 1532 07/26/13 0615 07/27/13 0640  WBC 8.7 7.6 8.0  NEUTROABS 6.8  --   --   HGB 14.0 12.8* 11.2*  HCT 40.3 37.3* 33.0*  MCV 88.2 88.6 88.5  PLT 220 203 215   Cardiac Enzymes: No results found for this basename: CKTOTAL, CKMB, CKMBINDEX, TROPONINI,  in the last 168 hours BNP (last 3 results) No results found for this basename: PROBNP,  in the last 8760 hours CBG:  Recent Labs Lab 07/26/13 1650 07/26/13 2320 07/27/13 0009 07/27/13 0627 07/27/13 1133  GLUCAP 122* 143* 156* 165* 315*    Recent Results (from the past 240 hour(s))  MRSA PCR SCREENING     Status: None   Collection Time    07/26/13  6:10 AM      Result Value Range Status   MRSA by PCR NEGATIVE  NEGATIVE Final   Comment:            The GeneXpert MRSA Assay (FDA     approved for NASAL specimens     only), is one component of a     comprehensive MRSA colonization     surveillance program. It is not     intended to diagnose MRSA     infection nor to guide or  monitor treatment for     MRSA infections.     Studies: Dg Chest 1 View  07/25/2013   CLINICAL DATA:  Right hip fracture  EXAM: CHEST - 1 VIEW  COMPARISON:  None.  FINDINGS: Artifact overlies chest. Heart size is normal. Mediastinal shadows are normal. The lungs are clear. No effusions. No bony abnormalities.  IMPRESSION: No active disease   Electronically Signed   By: Paulina Fusi M.D.   On: 07/25/2013 16:34   Dg Hip Complete Right  07/25/2013   CLINICAL DATA:  Larey Seat.  Pain.  EXAM: RIGHT HIP - COMPLETE 2+ VIEW  COMPARISON:  None.  FINDINGS: There is an inter trochanteric and sub trochanteric fracture of the femur with extension into the femoral neck. Separation of the main fragments measures approximately 2 cm. No fracture of the bones of the pelvis. Left proximal femur appears normal as seen.  IMPRESSION: Fracture in  the intertrochanteric and sub trochanteric region of the right femur with extension into the femoral neck. Maximum separation of fragments inferiorly is 2 cm. Somewhat unusual fracture pattern.   Electronically Signed   By: Paulina Fusi M.D.   On: 07/25/2013 16:34   Dg Femur Right  07/27/2013   CLINICAL DATA:  Fractured right hip.  EXAM: RIGHT FEMUR - 2 VIEW  COMPARISON:  Right hip radiograph July 25, 2012  FINDINGS: Five intraoperative fluoroscopic spot views of the right hip submitted for radiologic interpretation, radiologist was not present at time of operation. Femoral neck pain with intra medullary rod and intertrochanteric screw transfix nondisplaced intertrochanteric to sub trochanteric femur fracture in alignment.  IMPRESSION: Right femur ORIF.   Electronically Signed   By: Awilda Metro   On: 07/27/2013 03:47   Dg C-arm 1-60 Min  07/27/2013   CLINICAL DATA:  Fractured right hip.  EXAM: RIGHT FEMUR - 2 VIEW  Contrast was injected through the indwelling abscess catheter in order to determine if there is a communication between the abscess cavity and the bowel.  COMPARISON:  Right hip radiograph July 25, 2012  FINDINGS: Five intraoperative fluoroscopic spot views of the right hip submitted for radiologic interpretation, radiologist was not present at time of operation. Femoral neck pain with intra medullary rod and intertrochanteric screw transfix nondisplaced intertrochanteric to sub trochanteric femur fracture in alignment.  IMPRESSION: Right femur ORIF.   Electronically Signed   By: Awilda Metro   On: 07/27/2013 08:31    Scheduled Meds: . docusate sodium  100 mg Oral BID  . enoxaparin (LOVENOX) injection  40 mg Subcutaneous Q24H  . ferrous sulfate  325 mg Oral TID PC  . insulin aspart  0-9 Units Subcutaneous TID WC   Continuous Infusions: . sodium chloride 1,000 mL (07/27/13 0054)  . sodium chloride 0.9 % 1,000 mL with potassium chloride 10 mEq infusion 75 mL/hr at  07/27/13 0532    Principal Problem:   Hip fracture, right Active Problems:   Type II diabetes mellitus with renal manifestations, uncontrolled   HYPERLIPIDEMIA   HYPERTENSION   S/p nephrectomy   Renal cell carcinoma    Time spent: 25 minutes.     Estle Sabella  [facility] Pager [phone]. If 7PM-7AM, please contact night-coverage at [email or web address], password [facility] 07/27/2013, 2:02 PM  LOS: 2 days

## 2013-07-27 NOTE — Op Note (Signed)
NAMEMarland Kitchen  MAT, STUARD NO.:  [id number]  MEDICAL RECORD NO.:  [id number]  LOCATION:  5N03C                        FACILITY:  [facility]  PHYSICIAN:  Charles Frankel. Charlann [name], M.D.  DATE OF BIRTH:  1941-03-09  DATE OF PROCEDURE:  07/26/2013 DATE OF DISCHARGE:                              OPERATIVE REPORT   PREOPERATIVE DIAGNOSIS:  Right subtrochanteric femur fracture.  POSTOPERATIVE DIAGNOSIS:  Right subtrochanteric femur fracture.  PROCEDURE:  Open reduction and internal fixation of the right subtrochanteric femur fracture utilizing a [employer] VersaNail 13 x 420 mm with a cross pattern locking proximally and a single distal interlock distally.  SURGEON:  Charles Frankel. Charlann [name], M.D.  ASSISTANT:  Surgical team.  ANESTHESIA:  General.  SPECIMENS:  None.  COMPLICATION:  None.  BLOOD LOSS:  About 100 mL.  INDICATIONS FOR PROCEDURE:  Charles [name] is a very pleasant 72 year old gentleman who was out blowing leaves in his yard when he backed up and tripped over a flower pot, landed on his right hip directly, had an immediate onset of pain, inability to bear weight.  He was brought to emergency room.  Radiographs revealed a subtrochanteric femur fracture. The patient was admitted to the medical service per our fracture pathway routine.  He was seen and evaluated.  Risks, benefits and necessity of the procedure were discussed and reviewed.  Consent was obtained for benefit of fracture management.  Specific risk including infection and malunion versus nonunion and need for future surgeries.  Consent was obtained for benefit of fracture union.  PROCEDURE IN DETAIL:  The patient was brought to the operative theater. Once adequate anesthesia, preoperative antibiotics, Ancef 2 g administered.  The patient was positioned supine on the fracture table. His left unaffected extremity was flexed and abducted out of way with bony prominences padded, particularly over the lateral aspect of  the knee.  The right foot was placed in traction boot.  Once positioned appropriately, traction and internal rotation was applied to the leg and fluoroscopy was used to confirm near-anatomic reduction of the fracture at this point.  At this point, we prepped and draped the right leg from the iliac crest to below the knee for a long intramedullary nail.  A time-out was performed identifying the patient, planned procedure, and extremity.  Fluoroscopy was brought back to the field, identifying the tip of the trochanter.  A proximal incision was made.  Sharp dissection was carried to the gluteal fascia.  A guidewire was inserted into the tip of the trochanter across the fracture.  The proximal femur was opened.  The starting awl was passed into the proximal femur and the ball-tip guidewire was then passed to the knee and confirmed radiographically at the physial scar.  I then measured the depth and selected a 42 cm nail.  With a ball-tip guidewire in place, I began reaming with a 10 mm reamer, reamed all the way up to 15 mm and selected a 13 mm nail.  The 13 x 42 right VersaNail was then opened.  It was placed onto the insertion jig.  The nail was then passed by hand over the guidewire to its appropriate depth.  The guidewire  was subsequently removed.  While maintaining reduction at the fracture site, a cross pattern screw fixation proximally was carried out, 1 antegrade from the greater trochanter to the lesser trochanter, one into the femoral head and neck region.  The traction was let off to allow for further compression at the subtrochanteric fracture site.  The leg was abducted at this point, then through a perfect circle technique, a distal interlock screw was placed without complication.  Final radiographs were obtained in AP and lateral planes.  The wounds were irrigated with normal saline solution.  The distal interlock incision was reapproximated using 2-0 Vicryl.  The lateral  incision was reapproximated with 2-0 Vicryl.  The proximal incision was closed in layers with #1 Vicryl in the gluteal fascia and then 2-0 Vicryl and running 3-0 Monocryl.  All wounds were then cleaned, dried, dressed sterilely using Dermabond and Mepilex dressing.  He was then extubated and brought to the recovery room in stable condition, tolerating the procedure well.  I will make him partial weightbearing based on the size and the fracture pattern until union is confirmed.  Activity will begin tomorrow with physical therapy.  Findings will be reviewed with family.     Charles [name], M.D.     MDO/MEDQ  D:  07/26/2013  T:  07/27/2013  Job:  [id number]

## 2013-07-28 LAB — GLUCOSE, CAPILLARY
Glucose-Capillary: 136 mg/dL — ABNORMAL HIGH (ref 70–99)
Glucose-Capillary: 156 mg/dL — ABNORMAL HIGH (ref 70–99)

## 2013-07-28 MED ORDER — LISINOPRIL 20 MG PO TABS
20.0000 mg | ORAL_TABLET | Freq: Every day | ORAL | Status: DC
  Administered 2013-07-29: 20 mg via ORAL
  Filled 2013-07-28 (×3): qty 1

## 2013-07-28 MED ORDER — HYDROCODONE-ACETAMINOPHEN 5-325 MG PO TABS: 1.0000 | ORAL_TABLET | Freq: Four times a day (QID) | ORAL | Status: DC | PRN

## 2013-07-28 MED ORDER — ENOXAPARIN SODIUM 40 MG/0.4ML ~~LOC~~ SOLN: 40.0000 mg | SUBCUTANEOUS | Status: DC

## 2013-07-28 MED ORDER — ZOLPIDEM TARTRATE 5 MG PO TABS
5.0000 mg | ORAL_TABLET | Freq: Once | ORAL | Status: AC
  Administered 2013-07-28: 5 mg via ORAL
  Filled 2013-07-28: qty 1

## 2013-07-28 NOTE — Progress Notes (Signed)
Clinical Social Work Department  BRIEF PSYCHOSOCIAL ASSESSMENT  Patient: Charles [name]  Account Number: [id number]  Admit date: 07/25/13 Clinical Social Worker Sabino Niemann, MSW Date/Time: 07/28/2013 2:00 PM Referred by: Physician Date Referred: 07/27/13 Referred for   SNF Placement   Other Referral:  Interview type: Patient  Other interview type: PSYCHOSOCIAL DATA  Living Status: Alone Admitted from facility:  Level of care:  Primary support name: Carlus Pavlov Primary support relationship to patient: Daughter Degree of support available:  Strong and vested  CURRENT CONCERNS  Current Concerns   Post-Acute Placement   Other Concerns:  SOCIAL WORK ASSESSMENT / PLAN  CSW met with pt re: PT recommendation for SNF.   Pt lives alone  CSW explained placement process and answered questions.   Pt reports [facility]  as his preference    CSW completed FL2 and initiated SNF search.     Assessment/plan status: Information/Referral to [facility]  Other assessment/ plan:  Information/referral to community resources:  SNF     PATIENT'S/FAMILY'S RESPONSE TO PLAN OF CARE:  Pt  reports he is agreeable to [facility] SNF in order to increase strength and independence with mobility prior to returning home  Pt verbalized understanding of placement process and appreciation for CSW assist.   Sabino Niemann, MSW, LCSWA [phone]

## 2013-07-28 NOTE — Progress Notes (Signed)
Patient ID: Charles [name], male   DOB: July 09, 1941, 72 y.o.   MRN: [id number] Subjective: 2 Days Post-Op Procedure(s) (LRB): INTRAMEDULLARY (IM) NAIL FEMORAL subtrochanteric (Right)    Patient reports pain as moderate particularly with activity, difficulty even with some the basics right now.  But comfortable in bed  Objective:   VITALS:   Filed Vitals:   07/28/13 0626  BP: 110/55  Pulse: 82  Temp: 98.6 F (37 C)  Resp: 18    Neurovascular intact Incision: dressing C/D/I  LABS  Recent Labs  07/26/13 0615 07/27/13 0640 07/27/13 1950  HGB 12.8* 11.2* 10.7*  HCT 37.3* 33.0* 31.8*  WBC 7.6 8.0 7.8  PLT 203 215 206     Recent Labs  07/25/13 1532 07/26/13 0615 07/27/13 0640  NA 137 137 134*  K 4.3 4.5 4.4  BUN 16 18 14   CREATININE 1.19 1.18 1.22  GLUCOSE 187* 145* 178*     Recent Labs  07/25/13 1532  INR 1.03     Assessment/Plan: 2 Days Post-Op Procedure(s) (LRB): INTRAMEDULLARY (IM) NAIL FEMORAL subtrochanteric (Right)   Up with therapy Discharge to SNF most likely due to slower progress than expected  RTC 2 weeks for wound check and X-rays Call with any questions, [place] Mclees [phone] or [phone]

## 2013-07-28 NOTE — Progress Notes (Signed)
[facility] PROGRESS NOTE  Charles [name] WJX:[id number] DOB: 10/16/1940 DOA: 07/25/2013 PCP: Evette Georges, MD  Assessment/Plan: 72 y/o male with PMH hyperlipidemia, diabetes mellitus and hypertension are well controlled who was out blowing leaves today on his driveway when he stumbled over a flower pot sustaining a mechanical fall and landed on his right side found to have R femoral fracture   1. R femoral fracture; s/p IM nail -cont per ortho; likely SNF   2. DM Hold oral hypoglycemic medications. Continue with SSI. HA1C-7.4  3. HTN; resume  lisinopril prior to surgery to avoid renal insufficiency. SBP in the 130.   4. History of Renal Cell Carcinoma, post nephrectomy: Follow renal function daily.  5. Tachycardia: asymptomatic. Resolved   Code Status: full Family Communication: none at the bedside (indicate person spoken with, relationship, and if by phone, the number) Disposition Plan: likely SNF when ready    Consultants:  ortho  Procedures:  R hip  Antibiotics:  none (indicate start date, and stop date if known)  HPI/Subjective: alert  Objective: Filed Vitals:   07/28/13 0626  BP: 110/55  Pulse: 82  Temp: 98.6 F (37 C)  Resp: 18    Intake/Output Summary (Last 24 hours) at 07/28/13 0845 Last data filed at 07/28/13 0517  Gross per 24 hour  Intake   1110 ml  Output   1360 ml  Net   -250 ml   Filed Weights   07/25/13 1518  Weight: 113.399 kg (250 lb)    Exam:   General:  alert  Cardiovascular: s1,s2 rrr  Respiratory: CTA BL   Abdomen: soft, nt, nd   Musculoskeletal: no pedal edema    Data Reviewed: Basic Metabolic Panel:  Recent Labs Lab 07/25/13 1532 07/26/13 0615 07/27/13 0640  NA 137 137 134*  K 4.3 4.5 4.4  CL 99 99 97  CO2 25 26 25   GLUCOSE 187* 145* 178*  BUN 16 18 14   CREATININE 1.19 1.18 1.22  CALCIUM 9.3 8.7 8.3*   Liver Function Tests: No results found for this basename: AST, ALT, ALKPHOS, BILITOT, PROT,  ALBUMIN,  in the last 168 hours No results found for this basename: LIPASE, AMYLASE,  in the last 168 hours No results found for this basename: AMMONIA,  in the last 168 hours CBC:  Recent Labs Lab 07/25/13 1532 07/26/13 0615 07/27/13 0640 07/27/13 1950  WBC 8.7 7.6 8.0 7.8  NEUTROABS 6.8  --   --   --   HGB 14.0 12.8* 11.2* 10.7*  HCT 40.3 37.3* 33.0* 31.8*  MCV 88.2 88.6 88.5 88.8  PLT 220 203 215 206   Cardiac Enzymes: No results found for this basename: CKTOTAL, CKMB, CKMBINDEX, TROPONINI,  in the last 168 hours BNP (last 3 results) No results found for this basename: PROBNP,  in the last 8760 hours CBG:  Recent Labs Lab 07/27/13 0627 07/27/13 1133 07/27/13 1635 07/27/13 2151 07/28/13 0640  GLUCAP 165* 315* 226* 183* 136*    Recent Results (from the past 240 hour(s))  MRSA PCR SCREENING     Status: None   Collection Time    07/26/13  6:10 AM      Result Value Range Status   MRSA by PCR NEGATIVE  NEGATIVE Final   Comment:            The GeneXpert MRSA Assay (FDA     approved for NASAL specimens     only), is one component of a     comprehensive  MRSA colonization     surveillance program. It is not     intended to diagnose MRSA     infection nor to guide or     monitor treatment for     MRSA infections.     Studies: Dg Femur Right  07/27/2013   CLINICAL DATA:  Fractured right hip.  EXAM: RIGHT FEMUR - 2 VIEW  COMPARISON:  Right hip radiograph July 25, 2012  FINDINGS: Five intraoperative fluoroscopic spot views of the right hip submitted for radiologic interpretation, radiologist was not present at time of operation. Femoral neck pain with intra medullary rod and intertrochanteric screw transfix nondisplaced intertrochanteric to sub trochanteric femur fracture in alignment.  IMPRESSION: Right femur ORIF.   Electronically Signed   By: Awilda Metro   On: 07/27/2013 03:47   Dg C-arm 1-60 Min  07/27/2013   CLINICAL DATA:  Fractured right hip.  EXAM:  RIGHT FEMUR - 2 VIEW  Contrast was injected through the indwelling abscess catheter in order to determine if there is a communication between the abscess cavity and the bowel.  COMPARISON:  Right hip radiograph July 25, 2012  FINDINGS: Five intraoperative fluoroscopic spot views of the right hip submitted for radiologic interpretation, radiologist was not present at time of operation. Femoral neck pain with intra medullary rod and intertrochanteric screw transfix nondisplaced intertrochanteric to sub trochanteric femur fracture in alignment.  IMPRESSION: Right femur ORIF.   Electronically Signed   By: Awilda Metro   On: 07/27/2013 08:31    Scheduled Meds: . docusate sodium  100 mg Oral BID  . enoxaparin (LOVENOX) injection  40 mg Subcutaneous Q24H  . ferrous sulfate  325 mg Oral TID PC  . insulin aspart  0-9 Units Subcutaneous TID WC  . polyethylene glycol  17 g Oral BID   Continuous Infusions: . sodium chloride 1,000 mL (07/28/13 0517)  . sodium chloride 0.9 % 1,000 mL with potassium chloride 10 mEq infusion 75 mL/hr at 07/27/13 0532    Principal Problem:   Hip fracture, right Active Problems:   Type II diabetes mellitus with renal manifestations, uncontrolled   HYPERLIPIDEMIA   HYPERTENSION   S/p nephrectomy   Renal cell carcinoma    Time spent: >35 minutes     Esperanza Sheets  [facility] Pager [phone]. If 7PM-7AM, please contact night-coverage at [email or web address], password [facility] 07/28/2013, 8:45 AM  LOS: 3 days

## 2013-07-28 NOTE — Clinical Social Work Placement (Addendum)
Clinical Social Work Department  CLINICAL SOCIAL WORK PLACEMENT NOTE  Patient: Charles [name] Account Number: [id number]  Admit date: 07/25/13  Clinical Social Worker: Sabino Niemann LCSWA Date/time: 07/28/2013 11:30 AM  Clinical Social Work is seeking post-discharge placement for this patient at the following level of care: SKILLED NURSING (*CSW will update this form in Epic as items are completed)  07/28/2013 Patient/family provided with [facility] Department of Clinical Social Work's list of facilities offering this level of care within the geographic area requested by the patient (or if unable, by the patient's family).  07/28/2013 Patient/family informed of their freedom to choose among providers that offer the needed level of care, that participate in Medicare, Medicaid or managed care program needed by the patient, have an available bed and are willing to accept the patient.  07/28/2013 Patient/family informed of [facility]' ownership interest in [facility], as well as of the fact that they are under no obligation to receive care at this facility.  PASARR submitted to EDS on 07/29/2013 PASARR number received from EDS on 07/29/2013  FL2 transmitted to all facilities in geographic area requested by pt/family on 07/28/2013  FL2 transmitted to all facilities within larger geographic area on  Patient informed that his/her managed care company has contracts with or will negotiate with certain facilities, including the following:  Patient/family informed of bed offers received: 07/28/13 Patient chooses bed at [facility] Physician recommends and patient chooses bed at  Patient to be transferred to on 07/29/2013 Patient to be transferred to facility by [facility]  The following physician request were entered in Epic:  Additional Comments:

## 2013-07-28 NOTE — Progress Notes (Signed)
Physical Therapy Treatment Patient Details Name: Charles [name] MRN: [id number] DOB: 10/06/1940 Today's Date: 07/28/2013 Time: 1324-4010 PT Time Calculation (min): 24 min  PT Assessment / Plan / Recommendation  History of Present Illness Pt is a 72 yo male admitted for R femur ORIF after falling when blowing leaves.  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  Pt is 50% WB on RLE.   PT Comments   Pt showing some improvement during gait training, and was able to ambulate 6 feet with RW and mod assist to advance RLE. Pt continues to be discouraged with progress, however therapist reinforced gains he had made since yesterday and spoke with pt/family about goals and benefits of STR at d/c.   Follow Up Recommendations  SNF     Does the patient have the potential to tolerate intense rehabilitation     Barriers to Discharge        Equipment Recommendations  None recommended by PT    Recommendations for Other Services    Frequency Min 5X/week   Progress towards PT Goals Progress towards PT goals: Progressing toward goals  Plan Current plan remains appropriate    Precautions / Restrictions Precautions Precautions: Fall Restrictions Weight Bearing Restrictions: Yes RLE Weight Bearing: Partial weight bearing RLE Partial Weight Bearing Percentage or Pounds: 50   Pertinent Vitals/Pain 8/10 at end of session. Pt was repositioned for comfort.    Mobility  Bed Mobility Bed Mobility: Not assessed Details for Bed Mobility Assistance: Pt received sitting in recliner Transfers Transfers: Sit to Stand;Stand to Sit Sit to Stand: 1: +2 Total assist;From chair/3-in-1;With upper extremity assist Sit to Stand: Patient Percentage: 50% Stand to Sit: 1: +2 Total assist;To chair/3-in-1;With upper extremity assist Stand to Sit: Patient Percentage: 50% Details for Transfer Assistance: Utilized rocking technique to gain momentum to stand from low chair. Heavy mod assist +2 was required to  hold pt while he transitioned hands from armrests of chair to walker.  Ambulation/Gait Ambulation/Gait Assistance: 3: Mod assist Ambulation Distance (Feet): 6 Feet Assistive device: Rolling walker Ambulation/Gait Assistance Details: Pt able to tolerate mod assist to advance RLE. Pt able to advance LLE with improvement today, but continues to fatigue quickly with UE use on walker for support. Walker was lowered during session so pt could lock elbows as triceps and lats were fatiguing. Gait Pattern: Step-to pattern;Decreased stride length;Shuffle Gait velocity: Very slow Stairs: No    Exercises     PT Diagnosis:    PT Problem List:   PT Treatment Interventions:     PT Goals (current goals can now be found in the care plan section) Acute Rehab PT Goals Patient Stated Goal: to go home from here if possible. PT Goal Formulation: With patient/family Time For Goal Achievement: 08/03/13 Potential to Achieve Goals: Fair  Visit Information  Last PT Received On: 07/28/13 Assistance Needed: +2 History of Present Illness: Pt is a 72 yo male admitted for R femur ORIF after falling when blowing leaves.  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  Pt is 50% WB on RLE.    Subjective Data  Patient Stated Goal: to go home from here if possible.   Cognition  Cognition Arousal/Alertness: Awake/alert Behavior During Therapy: WFL for tasks assessed/performed Overall Cognitive Status: Within Functional Limits for tasks assessed    Balance     End of Session PT - End of Session Equipment Utilized During Treatment: Gait belt Activity Tolerance: Patient  limited by fatigue;Patient limited by pain Patient left: in chair;with call bell/phone within reach;with family/visitor present Nurse Communication: Mobility status   GP     Ruthann Cancer 07/28/2013, 12:09 PM  Ruthann Cancer, PT, DPT [phone]

## 2013-07-28 NOTE — Progress Notes (Signed)
OT Cancellation Note  Patient Details Name: KENDELL SAGRAVES MRN: [id number] DOB: 1940-11-26   Cancelled Treatment:     Reason pt not seen for acute OT: Cancel/refusal this pm. Attempted earlier in day x2 however, pt w/ PT & then RN staff. Pt reports that he just got back to bed and declines therapy despite maximal encouragement to participate. Will check back as able.   Roselie Awkward Dixon 07/28/2013, 1:39 PM

## 2013-07-28 NOTE — Progress Notes (Signed)
Inpatient Diabetes Program Recommendations  AACE/ADA: New Consensus Statement on Inpatient Glycemic Control (2013)  Target Ranges:  Prepandial:   less than 140 mg/dL      Peak postprandial:   less than 180 mg/dL (1-2 hours)      Critically ill patients:  140 - 180 mg/dL   Hyperglycemia following meals (fasting glucose is okay)  Inpatient Diabetes Program Recommendations Insulin - Meal Coverage: Patient would benefit from addition of 3 units meal ooverage while here. (given only if intake is .> 50 % (Would benefit better than an oral agent (s/u) as it may cause hypoglycemia if pt does not eat).  Thank you, Lenor Coffin, RN, CNS, Diabetes Coordinator [phone])

## 2013-07-29 LAB — GLUCOSE, CAPILLARY
Glucose-Capillary: 145 mg/dL — ABNORMAL HIGH (ref 70–99)
Glucose-Capillary: 166 mg/dL — ABNORMAL HIGH (ref 70–99)

## 2013-07-29 MED ORDER — FERROUS SULFATE 325 (65 FE) MG PO TABS: 325.0000 mg | ORAL_TABLET | Freq: Three times a day (TID) | ORAL | Status: DC

## 2013-07-29 MED ORDER — DSS 100 MG PO CAPS: 100.0000 mg | ORAL_CAPSULE | Freq: Two times a day (BID) | ORAL | Status: DC

## 2013-07-29 NOTE — Progress Notes (Signed)
Clinical social worker assisted with patient discharge to skilled nursing facility, [facility].  CSW addressed all family questions and concerns. CSW copied chart and added all important documents. CSW also set up patient transportation with [facility]. Clinical Social Worker will sign off for now as social work intervention is no longer needed.   Evelean Bigler, MSW, LCSWA [phone] 

## 2013-07-29 NOTE — Progress Notes (Signed)
Physical Therapy Treatment Patient Details Name: Charles [name] MRN: [id number] DOB: 1941-02-02 Today's Date: 07/29/2013 Time: 5621-3086 PT Time Calculation (min): 20 min  PT Assessment / Plan / Recommendation  History of Present Illness Pt is a 72 yo male admitted for R femur ORIF after falling when blowing leaves.  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  Pt is 50% WB on RLE.   PT Comments   Pt making slow progress towards physical therapy goals. Pt was able to improve ambulation distance and tolerance for functional activity this session. Less assist required to advance RLE. Pt is to d/c to SNF today for continued skilled PT.   Follow Up Recommendations  SNF     Does the patient have the potential to tolerate intense rehabilitation     Barriers to Discharge        Equipment Recommendations  None recommended by PT    Recommendations for Other Services    Frequency Min 5X/week   Progress towards PT Goals Progress towards PT goals: Progressing toward goals  Plan Current plan remains appropriate    Precautions / Restrictions Precautions Precautions: Fall Restrictions Weight Bearing Restrictions: Yes RLE Weight Bearing: Partial weight bearing RLE Partial Weight Bearing Percentage or Pounds: 50   Pertinent Vitals/Pain Pt reports he is feeling good today and does not complain of pain at rest.    Mobility  Bed Mobility Bed Mobility: Supine to Sit;Sitting - Scoot to Edge of Bed Supine to Sit: 3: Mod assist Sitting - Scoot to Edge of Bed: 4: Min guard Details for Bed Mobility Assistance: Assist for movement and support of RLE. VC's for sequencing and technique to transition to EOB. Transfers Transfers: Sit to Stand;Stand to Sit Sit to Stand: 1: +2 Total assist;From bed;With upper extremity assist Sit to Stand: Patient Percentage: 50% Stand to Sit: 1: +2 Total assist;To chair/3-in-1;With upper extremity assist Stand to Sit: Patient Percentage: 70% Details for  Transfer Assistance: Pt able to kick RLE out prior to initiating stand to sit transfer, and bed was elevated to assist in coming to full stand.  Ambulation/Gait Ambulation/Gait Assistance: 3: Mod assist Ambulation Distance (Feet): 12 Feet Assistive device: Rolling walker Ambulation/Gait Assistance Details: Min assist to advance RLE forward, and mod assist to unweight pt enough to advance LLE. Gait Pattern: Step-to pattern;Decreased stride length;Shuffle Gait velocity: Very slow Stairs: No    Exercises     PT Diagnosis:    PT Problem List:   PT Treatment Interventions:     PT Goals (current goals can now be found in the care plan section) Acute Rehab PT Goals Patient Stated Goal: to go home from here if possible. PT Goal Formulation: With patient/family Time For Goal Achievement: 08/03/13 Potential to Achieve Goals: Fair  Visit Information  Last PT Received On: 07/29/13 Assistance Needed: +2 History of Present Illness: Pt is a 72 yo male admitted for R femur ORIF after falling when blowing leaves.  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  Pt is 50% WB on RLE.    Subjective Data  Subjective: "I'm leaving today." Patient Stated Goal: to go home from here if possible.   Cognition  Cognition Arousal/Alertness: Awake/alert Behavior During Therapy: WFL for tasks assessed/performed Overall Cognitive Status: Within Functional Limits for tasks assessed    Balance     End of Session PT - End of Session Equipment Utilized During Treatment: Gait belt Activity Tolerance: Patient limited by fatigue;Patient  limited by pain Patient left: in chair;with call bell/phone within reach;with family/visitor present Nurse Communication: Mobility status   GP     Ruthann Cancer 07/29/2013, 12:40 PM  Ruthann Cancer, PT, DPT [phone]

## 2013-07-29 NOTE — Discharge Summary (Signed)
Physician Discharge Summary  Charles [name] ZOX:[id number] DOB: [birth year] DOA: 07/25/2013  PCP: Evette Georges, MD  Admit date: 07/25/2013 Discharge date: 07/29/2013  Time spent: >35 minutes  Recommendations for Outpatient Follow-up:  F/u with Dr. Charlann Boxer in 2 weeks F/u with PCP in 1-2 weeks  Discharge Diagnoses:  Principal Problem:   Hip fracture, right Active Problems:   Type II diabetes mellitus with renal manifestations, uncontrolled   HYPERLIPIDEMIA   HYPERTENSION   S/p nephrectomy   Renal cell carcinoma   Discharge Condition: stable   Diet recommendation: heart healthy   Filed Weights   07/25/13 1518  Weight: 113.399 kg (250 lb)    History of present illness:  72 y/o male with PMH hyperlipidemia, diabetes mellitus and hypertension are well controlled who was out blowing leaves today on his driveway when he stumbled over a flower pot sustaining a mechanical fall and landed on his right side found to have R femoral fracture   Hospital Course:  1. R femoral fracture; s/p IM nail; stable   -f/u with orthopedics outpatient 2. DM HA1C-7.4; resume home regimen; titrate pere response  3. HTN; resume home meds   4. History of Renal Cell Carcinoma, post nephrectomy: cont monitoring  5. Tachycardia: asymptomatic. Resolved     Procedures:  S/p R hip (i.e. Studies not automatically included, echos, thoracentesis, etc; not x-rays)  Consultations:  Orthopedics   Discharge Exam: Filed Vitals:   07/29/13 0751  BP:   Pulse:   Temp:   Resp: 20    General: alert Cardiovascular: s1,s2 rrr Respiratory: CTA BL   Discharge Instructions  Discharge Orders   Future Appointments Provider Department Dept Phone   09/14/2013 2:00 PM Carlus Pavlov, MD [facility] Endocrinology [phone]   Future Orders Complete By Expires   Diet - low sodium heart healthy  As directed    Discharge instructions  As directed    Comments:     Follow up with Dr. Charlann Boxer in 2  weeks   Increase activity slowly  As directed    Partial weight bearing  As directed    Scheduling Instructions:     50%   Questions:     % Body Weight:     Laterality:     Extremity:         Medication List         aspirin 81 MG tablet  Take 81 mg by mouth daily. Monday-Friday     DSS 100 MG Caps  Take 100 mg by mouth 2 (two) times daily.     enoxaparin 40 MG/0.4ML injection  Commonly known as:  LOVENOX  Inject 0.4 mLs (40 mg total) into the skin daily.     EPIPEN 2-PAK 0.3 mg/0.3 mL Devi  Generic drug:  EPINEPHrine  Inject 0.3 mg into the muscle once.     EYE VITAMINS Caps  Take by mouth.     ferrous sulfate 325 (65 FE) MG tablet  Take 1 tablet (325 mg total) by mouth 3 (three) times daily after meals.     glipiZIDE 10 MG tablet  Commonly known as:  GLUCOTROL  1 by mouth twice a day     HYDROcodone-acetaminophen 5-325 MG per tablet  Commonly known as:  NORCO  Take 1-2 tablets by mouth every 6 (six) hours as needed.     lisinopril 40 MG tablet  Commonly known as:  PRINIVIL,ZESTRIL  Take 40 mg by mouth daily.     metFORMIN 1000 MG (MOD) 24  hr tablet  Commonly known as:  GLUMETZA  Take 1,000 mg by mouth 2 (two) times daily.     multivitamin tablet  Take 1 tablet by mouth daily.     VITAMIN B 12 PO  Take 1 tablet by mouth daily.       No Known Allergies     Follow-up Information   Follow up with Shelda Pal, MD. Schedule an appointment as soon as possible for a visit in 2 weeks.   Specialty:  Orthopedic Surgery   Contact information:   [address] [place] [address] [phone]       Follow up with TODD,JEFFREY Freida Busman, MD In 1 week.   Specialty:  Family Medicine   Contact information:   [address] [place] [address] [phone]        The results of significant diagnostics from this hospitalization (including imaging, microbiology, ancillary and laboratory) are listed below for reference.     Significant Diagnostic Studies: Dg Chest 1 View  07/25/2013   CLINICAL DATA:  Right hip fracture  EXAM: CHEST - 1 VIEW  COMPARISON:  None.  FINDINGS: Artifact overlies chest. Heart size is normal. Mediastinal shadows are normal. The lungs are clear. No effusions. No bony abnormalities.  IMPRESSION: No active disease   Electronically Signed   By: Paulina Fusi M.D.   On: 07/25/2013 16:34   Dg Hip Complete Right  07/25/2013   CLINICAL DATA:  Larey Seat.  Pain.  EXAM: RIGHT HIP - COMPLETE 2+ VIEW  COMPARISON:  None.  FINDINGS: There is an inter trochanteric and sub trochanteric fracture of the femur with extension into the femoral neck. Separation of the main fragments measures approximately 2 cm. No fracture of the bones of the pelvis. Left proximal femur appears normal as seen.  IMPRESSION: Fracture in the intertrochanteric and sub trochanteric region of the right femur with extension into the femoral neck. Maximum separation of fragments inferiorly is 2 cm. Somewhat unusual fracture pattern.   Electronically Signed   By: Paulina Fusi M.D.   On: 07/25/2013 16:34   Dg Femur Right  07/27/2013   CLINICAL DATA:  Fractured right hip.  EXAM: RIGHT FEMUR - 2 VIEW  COMPARISON:  Right hip radiograph July 25, 2012  FINDINGS: Five intraoperative fluoroscopic spot views of the right hip submitted for radiologic interpretation, radiologist was not present at time of operation. Femoral neck pain with intra medullary rod and intertrochanteric screw transfix nondisplaced intertrochanteric to sub trochanteric femur fracture in alignment.  IMPRESSION: Right femur ORIF.   Electronically Signed   By: Awilda Metro   On: 07/27/2013 03:47   Dg C-arm 1-60 Min  07/27/2013   CLINICAL DATA:  Fractured right hip.  EXAM: RIGHT FEMUR - 2 VIEW  Contrast was injected through the indwelling abscess catheter in order to determine if there is a communication between the abscess cavity and the bowel.  COMPARISON:  Right hip  radiograph July 25, 2012  FINDINGS: Five intraoperative fluoroscopic spot views of the right hip submitted for radiologic interpretation, radiologist was not present at time of operation. Femoral neck pain with intra medullary rod and intertrochanteric screw transfix nondisplaced intertrochanteric to sub trochanteric femur fracture in alignment.  IMPRESSION: Right femur ORIF.   Electronically Signed   By: Awilda Metro   On: 07/27/2013 08:31    Microbiology: Recent Results (from the past 240 hour(s))  MRSA PCR SCREENING     Status: None   Collection Time  07/26/13  6:10 AM      Result Value Range Status   MRSA by PCR NEGATIVE  NEGATIVE Final   Comment:            The GeneXpert MRSA Assay (FDA     approved for NASAL specimens     only), is one component of a     comprehensive MRSA colonization     surveillance program. It is not     intended to diagnose MRSA     infection nor to guide or     monitor treatment for     MRSA infections.     Labs: Basic Metabolic Panel:  Recent Labs Lab 07/25/13 1532 07/26/13 0615 07/27/13 0640  NA 137 137 134*  K 4.3 4.5 4.4  CL 99 99 97  CO2 25 26 25   GLUCOSE 187* 145* 178*  BUN 16 18 14   CREATININE 1.19 1.18 1.22  CALCIUM 9.3 8.7 8.3*   Liver Function Tests: No results found for this basename: AST, ALT, ALKPHOS, BILITOT, PROT, ALBUMIN,  in the last 168 hours No results found for this basename: LIPASE, AMYLASE,  in the last 168 hours No results found for this basename: AMMONIA,  in the last 168 hours CBC:  Recent Labs Lab 07/25/13 1532 07/26/13 0615 07/27/13 0640 07/27/13 1950  WBC 8.7 7.6 8.0 7.8  NEUTROABS 6.8  --   --   --   HGB 14.0 12.8* 11.2* 10.7*  HCT 40.3 37.3* 33.0* 31.8*  MCV 88.2 88.6 88.5 88.8  PLT 220 203 215 206   Cardiac Enzymes: No results found for this basename: CKTOTAL, CKMB, CKMBINDEX, TROPONINI,  in the last 168 hours BNP: BNP (last 3 results) No results found for this basename: PROBNP,  in  the last 8760 hours CBG:  Recent Labs Lab 07/28/13 0640 07/28/13 1101 07/28/13 1625 07/28/13 2152 07/29/13 0624  GLUCAP 136* 177* 150* 156* 145*       Signed:  Jonette Mate N  [facility] 07/29/2013, 9:35 AM

## 2013-07-29 NOTE — Progress Notes (Signed)
Pt discharged to [facility], SNF. Report called to RN at [facility]. Patients IV was removed and left in stable condition via EMS.

## 2013-07-30 ENCOUNTER — Non-Acute Institutional Stay (SKILLED_NURSING_FACILITY): Payer: Medicare Other | Admitting: Adult Health

## 2013-07-30 ENCOUNTER — Encounter (HOSPITAL_COMMUNITY): Payer: Self-pay | Admitting: Orthopedic Surgery

## 2013-07-30 DIAGNOSIS — G4700 Insomnia, unspecified: Secondary | ICD-10-CM

## 2013-07-30 DIAGNOSIS — S72009A Fracture of unspecified part of neck of unspecified femur, initial encounter for closed fracture: Secondary | ICD-10-CM

## 2013-07-30 DIAGNOSIS — I10 Essential (primary) hypertension: Secondary | ICD-10-CM

## 2013-07-30 DIAGNOSIS — K5900 Constipation, unspecified: Secondary | ICD-10-CM

## 2013-07-30 DIAGNOSIS — E1129 Type 2 diabetes mellitus with other diabetic kidney complication: Secondary | ICD-10-CM

## 2013-07-30 DIAGNOSIS — N058 Unspecified nephritic syndrome with other morphologic changes: Secondary | ICD-10-CM

## 2013-07-30 DIAGNOSIS — D62 Acute posthemorrhagic anemia: Secondary | ICD-10-CM

## 2013-07-30 DIAGNOSIS — S72001A Fracture of unspecified part of neck of right femur, initial encounter for closed fracture: Secondary | ICD-10-CM

## 2013-08-04 ENCOUNTER — Non-Acute Institutional Stay (SKILLED_NURSING_FACILITY): Payer: Medicare Other | Admitting: Internal Medicine

## 2013-08-04 DIAGNOSIS — E1129 Type 2 diabetes mellitus with other diabetic kidney complication: Secondary | ICD-10-CM

## 2013-08-04 DIAGNOSIS — I150 Renovascular hypertension: Secondary | ICD-10-CM

## 2013-08-04 DIAGNOSIS — C649 Malignant neoplasm of unspecified kidney, except renal pelvis: Secondary | ICD-10-CM

## 2013-08-04 DIAGNOSIS — S72009S Fracture of unspecified part of neck of unspecified femur, sequela: Secondary | ICD-10-CM

## 2013-08-04 DIAGNOSIS — S72001S Fracture of unspecified part of neck of right femur, sequela: Secondary | ICD-10-CM

## 2013-08-17 ENCOUNTER — Non-Acute Institutional Stay (SKILLED_NURSING_FACILITY): Payer: Medicare Other | Admitting: Adult Health

## 2013-08-17 DIAGNOSIS — S72009A Fracture of unspecified part of neck of unspecified femur, initial encounter for closed fracture: Secondary | ICD-10-CM

## 2013-08-17 DIAGNOSIS — E1165 Type 2 diabetes mellitus with hyperglycemia: Secondary | ICD-10-CM

## 2013-08-17 DIAGNOSIS — K5900 Constipation, unspecified: Secondary | ICD-10-CM

## 2013-08-17 DIAGNOSIS — I10 Essential (primary) hypertension: Secondary | ICD-10-CM

## 2013-08-17 DIAGNOSIS — D62 Acute posthemorrhagic anemia: Secondary | ICD-10-CM

## 2013-08-17 DIAGNOSIS — S72001A Fracture of unspecified part of neck of right femur, initial encounter for closed fracture: Secondary | ICD-10-CM

## 2013-08-17 DIAGNOSIS — N058 Unspecified nephritic syndrome with other morphologic changes: Secondary | ICD-10-CM

## 2013-08-17 DIAGNOSIS — G4700 Insomnia, unspecified: Secondary | ICD-10-CM

## 2013-08-17 DIAGNOSIS — E1129 Type 2 diabetes mellitus with other diabetic kidney complication: Secondary | ICD-10-CM

## 2013-08-27 DIAGNOSIS — G4700 Insomnia, unspecified: Secondary | ICD-10-CM | POA: Insufficient documentation

## 2013-08-27 DIAGNOSIS — D62 Acute posthemorrhagic anemia: Secondary | ICD-10-CM | POA: Insufficient documentation

## 2013-08-27 DIAGNOSIS — K5900 Constipation, unspecified: Secondary | ICD-10-CM | POA: Insufficient documentation

## 2013-08-27 NOTE — Progress Notes (Signed)
Patient ID: Charles [name], male   DOB: Jan 01, 1941, 72 y.o.   MRN: [id number]                     PROGRESS NOTE  DATE: 07/30/2013  FACILITY: Nursing Home Location: [facility]  LEVEL OF CARE: SNF (31)  Acute Visit  CHIEF COMPLAINT:  Follow-up hospitalization  HISTORY OF PRESENT ILLNESS: This is a 72 year old male who has been admitted to [facility] on 07/29/13 from [facility]. He had a fall at home and sustained a right hip fracture and had an IM nail done. He has been admitted for a short-term rehabilitation.  REASSESSMENT OF ONGOING PROBLEM(S):  HTN: Pt 's HTN remains stable.  Denies CP, sob, DOE, pedal edema, headaches, dizziness or visual disturbances.  No complications from the medications currently being used.  Last BP : 135/85  ANEMIA: The anemia has been stable. The patient denies fatigue, melena or hematochezia. No complications from the medications currently being used. 11/14 hgb 10.7  CONSTIPATION: The constipation remains a problem. Patient has ongoing constipation.   PAST MEDICAL HISTORY : Reviewed.  No changes.  CURRENT MEDICATIONS: Reviewed per [facility]  REVIEW OF SYSTEMS:  GENERAL: no change in appetite, no fatigue, no weight changes, no fever, chills or weakness RESPIRATORY: no cough, SOB, DOE, wheezing, hemoptysis CARDIAC: no chest pain, edema or palpitations GI: no abdominal pain, diarrhea, constipation, heart burn, nausea or vomiting PSYCHIATRIC: +insomnia   PHYSICAL EXAMINATION  VS:  T99.4       P97      RR18      BP132/85     POX92 %     WT251.8 (Lb)  GENERAL: no acute distress, normal body habitus EYES: conjunctivae normal, sclerae normal, normal eye lids NECK: supple, trachea midline, no neck masses, no thyroid tenderness, no thyromegaly LYMPHATICS: no LAN in the neck, no supraclavicular LAN RESPIRATORY: breathing is even & unlabored, BS CTAB CARDIAC: RRR, no murmur,no extra heart sounds, no edema GI: abdomen soft, normal  BS, no masses, no tenderness, no hepatomegaly, no splenomegaly PSYCHIATRIC: the patient is alert & oriented to person, affect & behavior appropriate  LABS/RADIOLOGY: 07/27/13  WBC 7.8 hemoglobin 10.7 hematocrit 31.8 sodium 134 potassium 4.4 close 178 BUN 14  creatinine 1.22 calcium 8.3 06/29/13 hemoglobin A1c 7.6    ASSESSMENT/PLAN:  Right Hip Fracture S/P IM Nail - for rehabilitation  Hypertension - well-controlled: continue Lisinopril  Diabetes Mellitus, type 2 - well-controlled; continue Glucotrol and Metformin  Anemia, acute blood loss - stable; continue ferrous sulfate  Constipation - increase Miralax 17 gm + 6-8 oz liquid PO BID  Insomnia - start Ambien 5 1 tab PO Q HS PRN      CPT CODE: 82956

## 2013-08-27 NOTE — Progress Notes (Signed)
Patient ID: Charles [name], male   DOB: Dec 12, 1940, 72 y.o.   MRN: [id number]              PROGRESS NOTE  DATE: 08/17/2013   FACILITY: [facility]  LEVEL OF CARE: SNF (31)  Acute Visit  CHIEF COMPLAINT:  Discharge Notes   HISTORY OF PRESENT ILLNESS:This is a 72 year old male who is for discharge home with Home health PT and OT. DME: Rolling walker and 3-in-1 commode. He has has been admitted to [facility] on 07/29/13 from [facility]. He had a fall at home and sustained a right hip fracture and had an IM nail done. Patient was admitted to this facility for short-term rehabilitation after the patient's recent hospitalization. Patient has completed SNF rehabilitation and therapy has cleared the patient for discharge.  Reassessment of ongoing problem(s):  HTN: Pt 's HTN remains stable.  Denies CP, sob, DOE, pedal edema, headaches, dizziness or visual disturbances.  No complications from the medications currently being used.  Last BP : 135/76  INSOMNIA: The insomnia remains stable.  No complications noted from the medications presently being used. Patient denies ongoing insomnia, pain, hallucinations, delusions.  DM:pt's DM remains stable.  Pt denies polyuria, polydipsia, polyphagia, changes in vision or hypoglycemic episodes.  No complications noted from the medication presently being used.   10/14  hemoglobin A1c 7.6   PAST MEDICAL HISTORY : Reviewed.  No changes.  CURRENT MEDICATIONS: Reviewed per [facility]  REVIEW OF SYSTEMS:  GENERAL: no change in appetite, no fatigue, no weight changes, no fever, chills or weakness RESPIRATORY: no cough, SOB, DOE, wheezing, hemoptysis CARDIAC: no chest pain, edema or palpitations GI: no abdominal pain, diarrhea, constipation, heart burn, nausea or vomiting  PHYSICAL EXAMINATION  VS:  T97.6       P78       RR18      BP135/76      POX %       WT235 (Lb)  GENERAL: no acute distress, normal body habitus NECK: supple, trachea  midline, no neck masses, no thyroid tenderness, no thyromegaly RESPIRATORY: breathing is even & unlabored, BS CTAB CARDIAC: RRR, no murmur,no extra heart sounds, no edema GI: abdomen soft, normal BS, no masses, no tenderness, no hepatomegaly, no splenomegaly PSYCHIATRIC: the patient is alert & oriented to person, affect & behavior appropriate  LABS/RADIOLOGY: 07/27/13  WBC 7.8 hemoglobin 10.7 hematocrit 31.8 sodium 134 potassium 4.4 close 178 BUN 14  creatinine 1.22 calcium 8.3 06/29/13 hemoglobin A1c 7.6    ASSESSMENT/PLAN:  Right Hip Fracture S/P IM Nail - for Home health PT and OT  Hypertension - well-controlled: continue Lisinopril  Diabetes Mellitus, type 2 - well-controlled; continue Glucotrol and Metformin  Anemia, acute blood loss - stable; continue ferrous sulfate  Constipation - no complaints  Insomnia - no complaints   I have filled out patient's discharge paperwork and written prescriptions.  Patient will receive home health PT and OT.  DME provided: Rolling walker and 3-in-1 commode  Total discharge time: Greater than 30 minutes Discharge time involved coordination of the discharge process with Child psychotherapist, nursing staff and therapy department. Medical justification for home health services/DME verified.  CPT CODE: 65784

## 2013-09-14 ENCOUNTER — Ambulatory Visit: Payer: Medicare Other | Admitting: Internal Medicine

## 2013-09-20 ENCOUNTER — Encounter: Payer: Self-pay | Admitting: Internal Medicine

## 2013-09-20 DIAGNOSIS — I150 Renovascular hypertension: Secondary | ICD-10-CM | POA: Insufficient documentation

## 2013-09-20 NOTE — Progress Notes (Signed)
Patient ID: Charles [name], male   DOB: 1941-02-11, 73 y.o.   MRN: [id number]               HISTORY & PHYSICAL  DATE: 08/04/2013     FACILITY: [facility]  LEVEL OF CARE: SNF (31)  ALLERGIES:  No Known Allergies  CHIEF COMPLAINT:  Manage right hip fracture, diabetes mellitus, and hypertension.    HISTORY OF PRESENT ILLNESS:  The patient is a 73 year-old, Caucasian male.    HIP FRACTURE: The patient had a mechanical fall and sustained a femur fracture.  Patient subsequently underwent surgical repair and tolerated the procedure well. Patient is admitted to this facility for short-term rehabilitation. Patient denies hip pain currently. No complications reported from the pain medications currently being used.    DM:pt's DM remains stable.  Pt denies polyuria, polydipsia, polyphagia, changes in vision or hypoglycemic episodes.  No complications noted from the medication presently being used.  Last hemoglobin A1c is:   7.4.    HTN: Pt 's HTN remains stable.  Denies CP, sob, DOE, pedal edema, headaches, dizziness or visual disturbances.  No complications from the medications currently being used.  Last BP :   109/69.    PAST MEDICAL HISTORY :  Past Medical History  Diagnosis Date  . Hyperlipidemia   . Obesity   . Diabetes mellitus   . Hypertension   . Eczema   . Biceps muscle tear     right  . Hernia, ventral   . 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 AVM (arteriovenous malformation) of colon     2 - non-bleeding 2013  . Personal history of adenomatous colonic polyps 07/20/2012    3 + adenomas 2009 07/20/2012 2 diminutive polyps      PAST SURGICAL HISTORY: Past Surgical History  Procedure Laterality Date  . Inguinal hernia repair      left, x 3  . Inguinal hernia repair  2012    right  . Left nephrectomy  2000  . Femur im nail Right 07/26/2013    Procedure: INTRAMEDULLARY (IM) NAIL FEMORAL subtrochanteric;  Surgeon: Mauri Pole, MD;  Location: [facility];  Service: Orthopedics;  Laterality: Right;    SOCIAL HISTORY:  reports that he quit smoking about 14 months ago. He has never used smokeless tobacco. He reports that he drinks about 0.6 ounces of alcohol per week. He reports that he does not use illicit drugs.  FAMILY HISTORY:  Family History  Problem Relation Age of Onset  . Drug abuse Other   . Cancer Other   . Heart disease Other   . Lung disease Other   . Colon cancer Neg Hx   . Stomach cancer Neg Hx     CURRENT MEDICATIONS: Reviewed per [facility]  REVIEW OF SYSTEMS:  See HPI otherwise 14 point ROS is negative.  PHYSICAL EXAMINATION  VS:  T 97.7       P 100      RR 20      BP 109/69      POX%        WT (Lb)  GENERAL: no acute distress, moderately obese body habitus EYES: conjunctivae normal, sclerae normal, normal eye lids MOUTH/THROAT: lips without lesions,no lesions in the mouth,tongue is without lesions,uvula elevates in midline NECK: supple, trachea midline, no neck masses, no thyroid tenderness, no thyromegaly LYMPHATICS: no LAN in the neck, no supraclavicular LAN RESPIRATORY: breathing is even & unlabored,  BS CTAB CARDIAC: RRR, no murmur,no extra heart sounds, no edema GI:  ABDOMEN: abdomen soft, normal BS, no masses, no tenderness  LIVER/SPLEEN: no hepatomegaly, no splenomegaly MUSCULOSKELETAL: HEAD: normal to inspection & palpation BACK: no kyphosis, scoliosis or spinal processes tenderness EXTREMITIES: LEFT UPPER EXTREMITY: full range of motion, normal strength & tone RIGHT UPPER EXTREMITY:  full range of motion, normal strength & tone LEFT LOWER EXTREMITY:  full range of motion, normal strength & tone RIGHT LOWER EXTREMITY: strength intact, range of motion not tested due to surgery   PSYCHIATRIC: the patient is alert & oriented to person, affect & behavior appropriate  LABS/RADIOLOGY: Chest x-ray:  No acute disease.    Right hip x-ray:  Showed fracture in the intertrochanteric and subtrochanteric region of  the right femur with extension into the femoral neck.    Right femur x-ray after surgery:  Showed ORIF.    MRSA by PCR negative.     Labs reviewed: Basic Metabolic Panel:  Recent Labs  07/25/13 1532 07/26/13 0615 07/27/13 0640  NA 137 137 134*  K 4.3 4.5 4.4  CL 99 99 97  CO2 25 26 25   GLUCOSE 187* 145* 178*  BUN 16 18 14   CREATININE 1.19 1.18 1.22  CALCIUM 9.3 8.7 8.3*   Liver Function Tests:  Recent Labs  03/15/13 1546 06/29/13 0836  AST 43* 34  ALT 52 49  ALKPHOS 54 51  BILITOT 0.9 0.6  PROT 6.6 6.5  ALBUMIN 4.1 3.9   CBC:  Recent Labs  03/15/13 1546 06/29/13 0836 07/25/13 1532 07/26/13 0615 07/27/13 0640 07/27/13 1950  WBC 5.6 5.4 8.7 7.6 8.0 7.8  NEUTROABS 3.0 3.3 6.8  --   --   --   HGB 14.1 13.4 14.0 12.8* 11.2* 10.7*  HCT 42.1 40.1 40.3 37.3* 33.0* 31.8*  MCV 94.3 88.9 88.2 88.6 88.5 88.8  PLT 238.0 224.0 220 203 215 206   Lipid Panel:  Recent Labs  06/29/13 0836  HDL 43.80   CBG:  Recent Labs  07/28/13 2152 07/29/13 0624 07/29/13 1108  GLUCAP 156* 145* 166*    ASSESSMENT/PLAN:  Right hip fracture.  Status post IM nail.  Continue rehabilitation.    Diabetes mellitus.  Not well controlled.  Monitor CBGs.    Hypertension.  Well controlled.    History of renal cell carcinoma.  Status post nephrectomy.    Constipation.  MiraLAX was started.    Insomnia.  Ambien was started.    Acute blood loss anemia.  Reassess hemoglobin level.    Check CBC and BMP.    THN Metrics:   Former smoker.  On aspirin 81 mg.  BP:  109/69.    I have reviewed patient's medical records received at admission/from hospitalization.  CPT CODE: 01751

## 2014-02-10 ENCOUNTER — Encounter: Payer: Self-pay | Admitting: Family Medicine

## 2014-02-10 ENCOUNTER — Ambulatory Visit (INDEPENDENT_AMBULATORY_CARE_PROVIDER_SITE_OTHER): Payer: Medicare Other | Admitting: Family Medicine

## 2014-02-10 VITALS — BP 120/84 | Temp 98.5°F | Wt 252.0 lb

## 2014-02-10 DIAGNOSIS — L259 Unspecified contact dermatitis, unspecified cause: Secondary | ICD-10-CM | POA: Insufficient documentation

## 2014-02-10 MED ORDER — TRIAMCINOLONE ACETONIDE 0.025 % EX OINT: 1.0000 "application " | TOPICAL_OINTMENT | Freq: Two times a day (BID) | CUTANEOUS | Status: DC

## 2014-02-10 MED ORDER — PREDNISONE 20 MG PO TABS: ORAL_TABLET | ORAL | Status: DC

## 2014-02-10 NOTE — Progress Notes (Signed)
   Subjective:    Patient ID: Charles [name], male    DOB: 09/21/1940, 73 y.o.   MRN: [id number]  HPI Charles [name] is a 72 year old male nonsmoker who comes in today for evaluation of a rash for about 5 months  He states in November he broke his femur postop he developed a rash on his lower extremity and his left arm. It's red. The contents to come and go. He's tried over-the-counter medications to no avail   Review of Systems    review of systems otherwise negative Objective:   Physical Exam  Well-developed well-nourished in no acute distress vital signs stable he is afebrile examination lower extremities shows a 1' x 6" area of erythema also same erythema left forearm 10" x 10"      Assessment & Plan:  2 areas of erythema. Take........Marland Kitchen probably contact dermatitis......... treatment program outlined with cream, triamcinolone, prednisone burst and taper, [facility] consult if symptoms persist

## 2014-02-10 NOTE — Progress Notes (Signed)
Pre visit review using our clinic review tool, if applicable. No additional management support is needed unless otherwise documented below in the visit note. 

## 2014-02-10 NOTE — Patient Instructions (Signed)
Udder cream......... small amounts twice daily  Triamcinolone gel........ small amounts twice daily  Prednisone 20 mg.........Marland Kitchen 1 tab x7 days, a half a tab x7 days, then a half a tab Monday Wednesday Friday for 3 week taper  Dermot consult with Dr. Allyson Sabal if symptoms do not resolve or persist.

## 2014-03-28 ENCOUNTER — Other Ambulatory Visit: Payer: Self-pay | Admitting: Family Medicine

## 2014-03-28 DIAGNOSIS — E1129 Type 2 diabetes mellitus with other diabetic kidney complication: Secondary | ICD-10-CM

## 2014-04-06 ENCOUNTER — Ambulatory Visit (INDEPENDENT_AMBULATORY_CARE_PROVIDER_SITE_OTHER): Payer: Medicare Other | Admitting: Internal Medicine

## 2014-04-06 ENCOUNTER — Encounter: Payer: Self-pay | Admitting: Internal Medicine

## 2014-04-06 VITALS — BP 122/68 | HR 88 | Temp 97.7°F | Resp 12 | Wt 257.0 lb

## 2014-04-06 DIAGNOSIS — E1129 Type 2 diabetes mellitus with other diabetic kidney complication: Secondary | ICD-10-CM

## 2014-04-06 LAB — COMPREHENSIVE METABOLIC PANEL
ALBUMIN: 3.9 g/dL (ref 3.5–5.2)
ALT: 52 U/L (ref 0–53)
AST: 45 U/L — AB (ref 0–37)
Alkaline Phosphatase: 61 U/L (ref 39–117)
BUN: 15 mg/dL (ref 6–23)
CO2: 26 mEq/L (ref 19–32)
Calcium: 8.8 mg/dL (ref 8.4–10.5)
Chloride: 105 mEq/L (ref 96–112)
Creatinine, Ser: 1.2 mg/dL (ref 0.4–1.5)
GFR: 64.31 mL/min (ref >60.00)
GLUCOSE: 179 mg/dL — AB (ref 70–99)
POTASSIUM: 4.3 meq/L (ref 3.5–5.1)
SODIUM: 137 meq/L (ref 135–145)
TOTAL PROTEIN: 6.7 g/dL (ref 6.0–8.3)
Total Bilirubin: 0.9 mg/dL (ref 0.2–1.2)

## 2014-04-06 LAB — HEMOGLOBIN A1C: Hgb A1c MFr Bld: 8.9 % — ABNORMAL HIGH (ref 4.6–6.5)

## 2014-04-06 LAB — MICROALBUMIN / CREATININE URINE RATIO
CREATININE, U: 89.8 mg/dL
MICROALB UR: 23.2 mg/dL — AB (ref 0.0–1.9)
MICROALB/CREAT RATIO: 25.8 mg/g (ref 0.0–30.0)

## 2014-04-06 NOTE — Progress Notes (Signed)
Patient ID: Charles [name], male   DOB: 1941/08/15, 73 y.o.   MRN: [id number]  HPI: Charles [name] is a 73 y.o.-year-old male, returning for f/u for DM2 dx 2008, non-insulin-dependent, uncontrolled, with complications (PN, mild CKD). Last visit 9 mo ago!  Pt broke his femur in 07/2013 >> surgery >> rod placement >> was in a nursing home for rehab x 20 days. He feels better and can walk well now >> [facility]  Last hemoglobin A1c was: 12/2013: 8.6% Lab Results  Component Value Date   HGBA1C 7.4* 06/29/2013   HGBA1C 8.9* 03/15/2013   HGBA1C 7.0* 06/25/2012   Pt is on a regimen of: - Metformin 1000 mg po bid - glipizide 10 mg bid  Pt checks his sugars once in am (no log) - am: 70-150 >> 87-141, most 90-120 >> 110-140, occasionally 180 - later in the day 105-141, but very few measurements >> n/c No lows. Lowest sugar was 98; he has hypoglycemia awareness in the 60s. Highest sugar was 180.  [facility] >> walks, bikes. Restarted this recently.   - has mild CKD, last BUN/creatinine:  Lab Results  Component Value Date   BUN 14 07/27/2013   CREATININE 1.22 07/27/2013  of note, he had a left nephrectomy. He is on lisinopril. ACR in 03/2013: 5.3.  - He has HTG; last set of lipids: Lab Results  Component Value Date   CHOL 207* 06/29/2013   HDL 43.80 06/29/2013   LDLCALC 122* 11/11/2008   LDLDIRECT 132.8 06/29/2013   TRIG 202.0* 06/29/2013   CHOLHDL 5 06/29/2013   - last eye exam was in 09/2013 - Dr Delman Cheadle. No DR.  - + numbness and tingling in his feet >> improved. Foot exam performed 06/2013.  He also has a history of HTN, HL, obesity. Also, see below.  ROS: Constitutional: no weight gain/loss, no fatigue, no subjective hyperthermia/hypothermia, + nocturia Eyes: no blurry vision, no xerophthalmia ENT: no sore throat, no nodules palpated in throat, no dysphagia/odynophagia, no hoarseness Cardiovascular: no CP/SOB/palpitations/+ leg swelling  (no pain) - after his R femur surgery Respiratory: no cough/SOB Gastrointestinal: no N/V/D/C Musculoskeletal: no muscle/joint aches Skin: + rash - stasis dermatitis R leg Neurological: no tremors/numbness/tingling/dizziness  I reviewed pt's medications, allergies, PMH, social hx, family hx and no changes required, except as mentioned above.  PE: BP 122/68  Pulse 88  Temp(Src) 97.7 F (36.5 C) (Oral)  Resp 12  Wt 257 lb (116.574 kg)  SpO2 98% Wt Readings from Last 3 Encounters:  04/06/14 257 lb (116.574 kg)  02/10/14 252 lb (114.306 kg)  07/25/13 250 lb (113.399 kg)   Constitutional: overweight, in NAD Eyes: PERRLA, EOMI, no exophthalmos ENT: moist mucous membranes, no thyromegaly, no cervical lymphadenopathy Cardiovascular: regularly irregular rhythm - PVCs, RR, No MRG Respiratory: CTA B Gastrointestinal: abdomen soft, NT, ND, BS+ Musculoskeletal: no deformities, strength intact in all 4 Skin: moist, warm, no rashes Neurological: no tremor with outstretched hands, DTR normal in all 4  ASSESSMENT: 1. DM2, non-insulin-dependent, uncontrolled, with complications - peripheral neuropathy - CKD  PLAN:  1. Patient with long-standing, uncontrolled diabetes, on oral antidiabetic regimen. He was doing great from the diabetes point of view, with almost all sugars at goal, until he fell and broke his femur. Since he was less mobile afterwards, he started to gain weight and also sugars deteriorated. He recently started to go back to the gym and feels better.  -  I advised him to: Patient Instructions  Please continue Metformin 1000 mg 2x a day with meals. Continue Glipizide 10 mg 2x a day before meals. Please stop at the lab. Please return in 1 month with your sugar log. Check sugars 2x a day, rotating check times. - he does not check sugars later in the day usually, so I advised him to do so >> check 2x a day, rotating checks - depending on the sugars, at next visit, we may need to  add a 3rd med - we will get a HbA1c, ACR, CMP (these were already ordered by Dr Sherren Mocha, but I reordered them to be able to be drawn at our lab). I will FWD these to him. - Return to clinic in 1 month with sugar log   Office Visit on 04/06/2014  Component Date Value Ref Range Status  . Microalb, Ur 04/06/2014 23.2* 0.0 - 1.9 mg/dL Final  . Creatinine,U 04/06/2014 89.8   Final  . Microalb Creat Ratio 04/06/2014 25.8  0.0 - 30.0 mg/g Final  . Hemoglobin A1C 04/06/2014 8.9* 4.6 - 6.5 % Final   Glycemic Control Guidelines for People with Diabetes:Non Diabetic:  <6%Goal of Therapy: <7%Additional Action Suggested:  >8%   . Sodium 04/06/2014 137  135 - 145 mEq/L Final  . Potassium 04/06/2014 4.3  3.5 - 5.1 mEq/L Final  . Chloride 04/06/2014 105  96 - 112 mEq/L Final  . CO2 04/06/2014 26  19 - 32 mEq/L Final  . Glucose, Bld 04/06/2014 179* 70 - 99 mg/dL Final  . BUN 04/06/2014 15  6 - 23 mg/dL Final  . Creatinine, Ser 04/06/2014 1.2  0.4 - 1.5 mg/dL Final  . Total Bilirubin 04/06/2014 0.9  0.2 - 1.2 mg/dL Final  . Alkaline Phosphatase 04/06/2014 61  39 - 117 U/L Final  . AST 04/06/2014 45* 0 - 37 U/L Final  . ALT 04/06/2014 52  0 - 53 U/L Final  . Total Protein 04/06/2014 6.7  6.0 - 8.3 g/dL Final  . Albumin 04/06/2014 3.9  3.5 - 5.2 g/dL Final  . Calcium 04/06/2014 8.8  8.4 - 10.5 mg/dL Final  . GFR 04/06/2014 64.31  >60.00 mL/min Final   Msg sent: Dear [name], The HbA1c is higher. The kidney function is about the same as before, but the urinary proteins are higher, to parallel the glucose control. Please work on your diet and exercise and will recheck the HbA1c when you come back. Sincerely, Philemon Kingdom MD

## 2014-04-06 NOTE — Patient Instructions (Signed)
Please continue Metformin 1000 mg 2x a day with meals. Continue Glipizide 10 mg 2x a day before meals.  Please stop at the lab.  Please return in 1 month with your sugar log. Check sugars 2x a day, rotating check times.

## 2014-05-19 ENCOUNTER — Ambulatory Visit: Payer: Medicare Other | Admitting: Internal Medicine

## 2014-06-02 ENCOUNTER — Other Ambulatory Visit: Payer: Self-pay | Admitting: *Deleted

## 2014-06-02 ENCOUNTER — Encounter: Payer: Self-pay | Admitting: Internal Medicine

## 2014-06-02 ENCOUNTER — Other Ambulatory Visit (INDEPENDENT_AMBULATORY_CARE_PROVIDER_SITE_OTHER): Payer: Medicare Other | Admitting: *Deleted

## 2014-06-02 ENCOUNTER — Ambulatory Visit (INDEPENDENT_AMBULATORY_CARE_PROVIDER_SITE_OTHER): Payer: Medicare Other | Admitting: Internal Medicine

## 2014-06-02 VITALS — BP 120/68 | HR 109 | Temp 98.4°F | Resp 12 | Wt 255.0 lb

## 2014-06-02 DIAGNOSIS — Z23 Encounter for immunization: Secondary | ICD-10-CM

## 2014-06-02 DIAGNOSIS — E1129 Type 2 diabetes mellitus with other diabetic kidney complication: Secondary | ICD-10-CM

## 2014-06-02 NOTE — Progress Notes (Signed)
Patient ID: Charles [name], male   DOB: [birth year], 73 y.o.   MRN: [id number]  HPI: Charles [name] is a 73 y.o.-year-old male, returning for f/u for DM2 dx 2008, non-insulin-dependent, uncontrolled, with complications (PN, mild CKD). Last visit 9 mo ago!  Pt broke his femur in 07/2013 >> surgery >> rod placement >> was in a nursing home for rehab x 20 days. Sugars higher after this >> [facility] visit. He still has some pain in his knees.  Last hemoglobin A1c was: Lab Results  Component Value Date   HGBA1C 8.9* 04/06/2014   HGBA1C 7.4* 06/29/2013   HGBA1C 8.9* 03/15/2013  12/2013: 8.6%  Pt is on a regimen of: - Metformin 1000 mg po bid - glipizide 10 mg bid  Pt checks his sugars once in am (no log) - am: 70-150 >> 87-141, most 90-120 >> 110-140, occasionally 180 >> 86-135  - 2h after b'fast: 106-187 - before lunch: 79-90, 157 - 2h after lunch: 123-176 - before dinner: 106, 125 - 2h after dinner: 173-197 No lows. Lowest sugar was 98; he has hypoglycemia awareness in the 60s. Highest sugar was 180.  [facility] >> walks, bikes. Restarted this recently.   - has mild CKD, last BUN/creatinine:  Lab Results  Component Value Date   BUN 15 04/06/2014   CREATININE 1.2 04/06/2014  of note, he had a left nephrectomy. He is on lisinopril. ACR in 03/2013: 5.3.  - He has HTG; last set of lipids: Lab Results  Component Value Date   CHOL 207* 06/29/2013   HDL 43.80 06/29/2013   LDLCALC 122* 11/11/2008   LDLDIRECT 132.8 06/29/2013   TRIG 202.0* 06/29/2013   CHOLHDL 5 06/29/2013   - last eye exam was in 05/2014 - Dr Charles [name]. No DR.  - + numbness and tingling in his feet >> improved. Foot exam performed 06/2013.  He also has a history of HTN, HL, obesity. Also, see below.  ROS: Constitutional: no weight gain/loss, no fatigue, no subjective hyperthermia/hypothermia, + nocturia Eyes: no blurry vision, no xerophthalmia ENT: no sore throat,  no nodules palpated in throat, no dysphagia/odynophagia, no hoarseness Cardiovascular: no CP/SOB/palpitations/+ leg swelling (periankle) Respiratory: no cough/SOB Gastrointestinal: no N/V/D/C Musculoskeletal: no muscle/joint aches Skin: + rash - stasis dermatitis R leg Neurological: no tremors/numbness/tingling/dizziness  I reviewed pt's medications, allergies, PMH, social hx, family hx and no changes required, except as mentioned above.  PE: BP 120/68  Pulse 109  Temp(Src) 98.4 F (36.9 C) (Oral)  Resp 12  Wt 255 lb (115.667 kg)  SpO2 95% Wt Readings from Last 3 Encounters:  06/02/14 255 lb (115.667 kg)  04/06/14 257 lb (116.574 kg)  02/10/14 252 lb (114.306 kg)   Constitutional: overweight, in NAD Eyes: PERRLA, EOMI, no exophthalmos ENT: moist mucous membranes, no thyromegaly, no cervical lymphadenopathy Cardiovascular: tachycardia, RR, No MRG, + periankle edema  - mild Respiratory: CTA B Gastrointestinal: abdomen soft, NT, ND, BS+ Musculoskeletal: no deformities, strength intact in all 4 Skin: moist, warm, no rashes Neurological: no tremor with outstretched hands, DTR normal in all 4  ASSESSMENT: 1. DM2, non-insulin-dependent, uncontrolled, with complications - peripheral neuropathy - CKD  PLAN:  1. Patient with long-standing, uncontrolled diabetes, on oral antidiabetic regimen. Sugars better, but higher when he was in [place]. - I advised him to: Patient Instructions  Please continue Metformin 1000 mg 2x a day with meals. Continue Glipizide 10 mg 2x  a day before meals. Please return in 2 months with your sugar log. Check sugars 2x a day, rotating check times. -continue to check 2x a day, rotating checks - no labs for now - Return to clinic in 3 months with sugar log

## 2014-06-02 NOTE — Patient Instructions (Signed)
Patient Instructions  Please continue Metformin 1000 mg 2x a day with meals. Continue Glipizide 10 mg 2x a day before meals. Please return in 2 months with your sugar log.  Check sugars 2x a day, rotating check times.

## 2014-06-13 ENCOUNTER — Telehealth: Payer: Self-pay | Admitting: Family Medicine

## 2014-06-13 MED ORDER — LISINOPRIL 40 MG PO TABS: 40.0000 mg | ORAL_TABLET | Freq: Every day | ORAL | Status: DC

## 2014-06-13 NOTE — Telephone Encounter (Signed)
[id number] -  ([place]), [place] - [place] is requesting re-fill on lisinopril (PRINIVIL,ZESTRIL) 40 MG tablet

## 2014-06-14 ENCOUNTER — Other Ambulatory Visit: Payer: Self-pay | Admitting: *Deleted

## 2014-06-14 MED ORDER — METFORMIN HCL 1000 MG PO TABS: 1000.0000 mg | ORAL_TABLET | Freq: Two times a day (BID) | ORAL | Status: DC

## 2014-07-08 ENCOUNTER — Other Ambulatory Visit (INDEPENDENT_AMBULATORY_CARE_PROVIDER_SITE_OTHER): Payer: Medicare Other

## 2014-07-08 DIAGNOSIS — Z0000 Encounter for general adult medical examination without abnormal findings: Secondary | ICD-10-CM

## 2014-07-08 DIAGNOSIS — E785 Hyperlipidemia, unspecified: Secondary | ICD-10-CM

## 2014-07-08 DIAGNOSIS — Z125 Encounter for screening for malignant neoplasm of prostate: Secondary | ICD-10-CM

## 2014-07-08 DIAGNOSIS — I10 Essential (primary) hypertension: Secondary | ICD-10-CM

## 2014-07-08 DIAGNOSIS — E1122 Type 2 diabetes mellitus with diabetic chronic kidney disease: Secondary | ICD-10-CM

## 2014-07-08 LAB — CBC WITH DIFFERENTIAL/PLATELET
BASOS ABS: 0.1 10*3/uL (ref 0.0–0.1)
BASOS PCT: 1.1 % (ref 0.0–3.0)
EOS ABS: 0.3 10*3/uL (ref 0.0–0.7)
Eosinophils Relative: 5.8 % — ABNORMAL HIGH (ref 0.0–5.0)
HEMATOCRIT: 42.6 % (ref 39.0–52.0)
HEMOGLOBIN: 13.8 g/dL (ref 13.0–17.0)
LYMPHS ABS: 1.4 10*3/uL (ref 0.7–4.0)
LYMPHS PCT: 26.4 % (ref 12.0–46.0)
MCHC: 32.4 g/dL (ref 30.0–36.0)
MCV: 92.3 fl (ref 78.0–100.0)
MONO ABS: 0.6 10*3/uL (ref 0.1–1.0)
Monocytes Relative: 12.5 % — ABNORMAL HIGH (ref 3.0–12.0)
Neutro Abs: 2.8 10*3/uL (ref 1.4–7.7)
Neutrophils Relative %: 54.2 % (ref 43.0–77.0)
Platelets: 223 10*3/uL (ref 150.0–400.0)
RBC: 4.62 Mil/uL (ref 4.22–5.81)
RDW: 14.5 % (ref 11.5–15.5)
WBC: 5.1 10*3/uL (ref 4.0–10.5)

## 2014-07-08 LAB — TSH: TSH: 2.37 u[IU]/mL (ref 0.35–4.50)

## 2014-07-08 LAB — POCT URINALYSIS DIPSTICK
BILIRUBIN UA: NEGATIVE
Blood, UA: NEGATIVE
GLUCOSE UA: NEGATIVE
Ketones, UA: NEGATIVE
Leukocytes, UA: NEGATIVE
NITRITE UA: NEGATIVE
Spec Grav, UA: 1.02
Urobilinogen, UA: 0.2
pH, UA: 5.5

## 2014-07-08 LAB — BASIC METABOLIC PANEL
BUN: 19 mg/dL (ref 6–23)
CHLORIDE: 102 meq/L (ref 96–112)
CO2: 30 meq/L (ref 19–32)
Calcium: 9.2 mg/dL (ref 8.4–10.5)
Creatinine, Ser: 1.3 mg/dL (ref 0.4–1.5)
GFR: 60.13 mL/min (ref >60.00)
Glucose, Bld: 157 mg/dL — ABNORMAL HIGH (ref 70–99)
Potassium: 5.3 mEq/L — ABNORMAL HIGH (ref 3.5–5.1)
SODIUM: 140 meq/L (ref 135–145)

## 2014-07-08 LAB — LIPID PANEL
Cholesterol: 220 mg/dL — ABNORMAL HIGH (ref 0–200)
HDL: 41.6 mg/dL (ref >39.00)
NonHDL: 178.4
TRIGLYCERIDES: 245 mg/dL — AB (ref 0.0–149.0)
Total CHOL/HDL Ratio: 5
VLDL: 49 mg/dL — ABNORMAL HIGH (ref 0.0–40.0)

## 2014-07-08 LAB — HEPATIC FUNCTION PANEL
ALK PHOS: 62 U/L (ref 39–117)
ALT: 53 U/L (ref 0–53)
AST: 44 U/L — ABNORMAL HIGH (ref 0–37)
Albumin: 3.4 g/dL — ABNORMAL LOW (ref 3.5–5.2)
BILIRUBIN DIRECT: 0.1 mg/dL (ref 0.0–0.3)
BILIRUBIN TOTAL: 0.7 mg/dL (ref 0.2–1.2)
Total Protein: 6.6 g/dL (ref 6.0–8.3)

## 2014-07-08 LAB — MICROALBUMIN / CREATININE URINE RATIO: Microalb, Ur: 21.1 mg/dL — ABNORMAL HIGH (ref 0.0–1.9)

## 2014-07-08 LAB — PSA: PSA: 1.22 ng/mL (ref 0.10–4.00)

## 2014-07-08 LAB — HEMOGLOBIN A1C: Hgb A1c MFr Bld: 8.5 % — ABNORMAL HIGH (ref 4.6–6.5)

## 2014-07-08 LAB — LDL CHOLESTEROL, DIRECT: Direct LDL: 139.8 mg/dL

## 2014-07-14 ENCOUNTER — Ambulatory Visit (INDEPENDENT_AMBULATORY_CARE_PROVIDER_SITE_OTHER): Payer: Medicare Other | Admitting: Family Medicine

## 2014-07-14 ENCOUNTER — Encounter: Payer: Self-pay | Admitting: Family Medicine

## 2014-07-14 VITALS — BP 110/80 | Temp 98.7°F | Ht 70.75 in | Wt 256.0 lb

## 2014-07-14 DIAGNOSIS — T6391XA Toxic effect of contact with unspecified venomous animal, accidental (unintentional), initial encounter: Secondary | ICD-10-CM

## 2014-07-14 DIAGNOSIS — S72001S Fracture of unspecified part of neck of right femur, sequela: Secondary | ICD-10-CM | POA: Diagnosis not present

## 2014-07-14 DIAGNOSIS — E1129 Type 2 diabetes mellitus with other diabetic kidney complication: Secondary | ICD-10-CM

## 2014-07-14 DIAGNOSIS — C649 Malignant neoplasm of unspecified kidney, except renal pelvis: Secondary | ICD-10-CM

## 2014-07-14 DIAGNOSIS — E785 Hyperlipidemia, unspecified: Secondary | ICD-10-CM | POA: Diagnosis not present

## 2014-07-14 DIAGNOSIS — L259 Unspecified contact dermatitis, unspecified cause: Secondary | ICD-10-CM | POA: Diagnosis not present

## 2014-07-14 DIAGNOSIS — E1165 Type 2 diabetes mellitus with hyperglycemia: Secondary | ICD-10-CM

## 2014-07-14 DIAGNOSIS — H6123 Impacted cerumen, bilateral: Secondary | ICD-10-CM | POA: Diagnosis not present

## 2014-07-14 DIAGNOSIS — I10 Essential (primary) hypertension: Secondary | ICD-10-CM | POA: Diagnosis not present

## 2014-07-14 DIAGNOSIS — Z860100 Personal history of colon polyps, unspecified: Secondary | ICD-10-CM

## 2014-07-14 DIAGNOSIS — D62 Acute posthemorrhagic anemia: Secondary | ICD-10-CM

## 2014-07-14 DIAGNOSIS — G4700 Insomnia, unspecified: Secondary | ICD-10-CM

## 2014-07-14 DIAGNOSIS — IMO0002 Reserved for concepts with insufficient information to code with codable children: Secondary | ICD-10-CM

## 2014-07-14 DIAGNOSIS — Z23 Encounter for immunization: Secondary | ICD-10-CM

## 2014-07-14 DIAGNOSIS — E669 Obesity, unspecified: Secondary | ICD-10-CM

## 2014-07-14 DIAGNOSIS — E139 Other specified diabetes mellitus without complications: Secondary | ICD-10-CM

## 2014-07-14 MED ORDER — METFORMIN HCL 1000 MG PO TABS: 1000.0000 mg | ORAL_TABLET | Freq: Two times a day (BID) | ORAL | Status: DC

## 2014-07-14 MED ORDER — LISINOPRIL 40 MG PO TABS: 40.0000 mg | ORAL_TABLET | Freq: Every day | ORAL | Status: DC

## 2014-07-14 MED ORDER — GLIPIZIDE 10 MG PO TABS: ORAL_TABLET | ORAL | Status: DC

## 2014-07-14 MED ORDER — TRIAMCINOLONE ACETONIDE 0.025 % EX OINT: 1.0000 "application " | TOPICAL_OINTMENT | Freq: Two times a day (BID) | CUTANEOUS | Status: DC

## 2014-07-14 NOTE — Patient Instructions (Signed)
Continue the glipizide 10 mg one twice daily  Continue the metformin 1000 mg one twice daily  . Strict diet........... Exercise 30 minutes daily  Check a fasting blood sugar daily  Follow-up in 2 months  A1c nonfasting one week prior  Continue your other medicines

## 2014-07-14 NOTE — Progress Notes (Signed)
Pre visit review using our clinic review tool, if applicable. No additional management support is needed unless otherwise documented below in the visit note. Lab Results  Component Value Date   HGBA1C 8.5* 07/08/2014   HGBA1C 8.9* 04/06/2014   HGBA1C 7.4* 06/29/2013   Lab Results  Component Value Date   MICROALBUR 21.1* 07/08/2014   LDLCALC 122* 11/11/2008   CREATININE 1.3 07/08/2014

## 2014-07-14 NOTE — Progress Notes (Signed)
   Subjective:    Patient ID: Charles [name], male    DOB: [birth year], 73 y.o.   MRN: [id number]  HPI Charles [name] is a 73 year old married male nonsmoker who comes in today for evaluation of obesity, diabetes type 2, hypertension, contact dermatitis lower extremities,  His med list was reviewed the been no changes. He says he feels well and has no problems.  Weight is 256 unchanged he's not been exercising on a daily basis  He gets routine eye care, dental care, colonoscopy every 10 years last 1 2013.  Cognitive function normal he does not exercise on a daily basis and encouraged to do so home health safety reviewed no issues identified, no guns in the house, he does have a healthcare power of attorney and living well  Social history,,,,,,, [place] [facility] [address] and wishes to transfer there   Review of Systems  Constitutional: Negative.   HENT: Negative.   Eyes: Negative.   Respiratory: Negative.   Cardiovascular: Negative.   Gastrointestinal: Negative.   Endocrine: Negative.   Genitourinary: Negative.   Musculoskeletal: Negative.   Skin: Negative.   Allergic/Immunologic: Negative.   Neurological: Negative.   Hematological: Negative.   Psychiatric/Behavioral: Negative.        Objective:   Physical Exam  Constitutional: He is oriented to person, place, and time. He appears well-developed and well-nourished.  HENT:  Head: Normocephalic and atraumatic.  Right Ear: External ear normal.  Left Ear: External ear normal.  Nose: Nose normal.  Mouth/Throat: Oropharynx is clear and moist.  Eyes: Conjunctivae and EOM are normal. Pupils are equal, round, and reactive to light.  Neck: Normal range of motion. Neck supple. No JVD present. No tracheal deviation present. No thyromegaly present.  Cardiovascular: Normal rate, regular rhythm, normal heart sounds and intact distal pulses.  Exam reveals no gallop and no friction rub.   No murmur heard. Pulmonary/Chest:  Effort normal and breath sounds normal. No stridor. No respiratory distress. He has no wheezes. He has no rales. He exhibits no tenderness.  Abdominal: Soft. Bowel sounds are normal. He exhibits no distension and no mass. There is no tenderness. There is no rebound and no guarding.  Genitourinary: Rectum normal, prostate normal and penis normal. Guaiac negative stool. No penile tenderness.  Musculoskeletal: Normal range of motion. He exhibits no edema or tenderness.  Lymphadenopathy:    He has no cervical adenopathy.  Neurological: He is alert and oriented to person, place, and time. He has normal reflexes. No cranial nerve deficit. He exhibits normal muscle tone.  Skin: Skin is warm and dry. No rash noted. No erythema. No pallor.  Psychiatric: He has a normal mood and affect. His behavior is normal. Judgment and thought content normal.  Nursing note and vitals reviewed. no carotid nor aortic bruits peripheral pulses 2+ and symmetrical  Contact dermatitis right lower extremities  Massive ventral hernia        Assessment & Plan:  Obesity not at goal,,,,,,,,,,,,again encouraged diet exercise and weight loss  Diabetes not at goal A1c 8.5%......Marland Kitchen glipizide  10 mg twice a day continue metformin 1000 mg twice daily.  Discussed adding insulin because his A1c is elevated and his blood sugar is not under good control with diet exercise and the above medication however he would like to try intensive diet and exercise before starting insulin  Hypertension ago continue current therapy

## 2014-07-15 ENCOUNTER — Telehealth: Payer: Self-pay | Admitting: Family Medicine

## 2014-07-15 NOTE — Telephone Encounter (Signed)
emmi emailed °

## 2014-07-21 ENCOUNTER — Telehealth: Payer: Self-pay | Admitting: Family Medicine

## 2014-07-21 DIAGNOSIS — E119 Type 2 diabetes mellitus without complications: Secondary | ICD-10-CM

## 2014-07-21 NOTE — Telephone Encounter (Signed)
[id number] - [place] ([place]), [place] - [place] [phone] is requesting re-fill on ONE TOUCH ULTRA TEST test strip

## 2014-07-22 MED ORDER — GLUCOSE BLOOD VI STRP: ORAL_STRIP | Status: DC

## 2014-07-22 NOTE — Telephone Encounter (Signed)
Rx sent to pharmacy   

## 2014-08-25 ENCOUNTER — Ambulatory Visit: Payer: Medicare Other | Admitting: Internal Medicine

## 2014-08-30 ENCOUNTER — Telehealth: Payer: Self-pay | Admitting: Family Medicine

## 2014-08-30 NOTE — Telephone Encounter (Signed)
Called patient @ home phone # left vm asking to call us and advise if he has had eye exam in the last 24 months.

## 2014-09-09 LAB — HM DIABETES EYE EXAM

## 2014-09-19 ENCOUNTER — Other Ambulatory Visit (INDEPENDENT_AMBULATORY_CARE_PROVIDER_SITE_OTHER): Payer: Medicare Other

## 2014-09-19 ENCOUNTER — Ambulatory Visit: Payer: Medicare Other | Admitting: Family Medicine

## 2014-09-19 DIAGNOSIS — E1165 Type 2 diabetes mellitus with hyperglycemia: Secondary | ICD-10-CM | POA: Diagnosis not present

## 2014-09-19 DIAGNOSIS — IMO0002 Reserved for concepts with insufficient information to code with codable children: Secondary | ICD-10-CM

## 2014-09-19 DIAGNOSIS — E1129 Type 2 diabetes mellitus with other diabetic kidney complication: Secondary | ICD-10-CM

## 2014-09-19 LAB — HEMOGLOBIN A1C: HEMOGLOBIN A1C: 8.9 % — AB (ref 4.6–6.5)

## 2014-09-26 ENCOUNTER — Ambulatory Visit: Payer: Medicare Other | Admitting: Family Medicine

## 2014-09-27 ENCOUNTER — Encounter: Payer: Self-pay | Admitting: Family Medicine

## 2014-09-27 ENCOUNTER — Ambulatory Visit (INDEPENDENT_AMBULATORY_CARE_PROVIDER_SITE_OTHER): Payer: Medicare Other | Admitting: Family Medicine

## 2014-09-27 VITALS — BP 120/80 | Temp 98.2°F | Wt 255.0 lb

## 2014-09-27 DIAGNOSIS — IMO0002 Reserved for concepts with insufficient information to code with codable children: Secondary | ICD-10-CM

## 2014-09-27 DIAGNOSIS — E1165 Type 2 diabetes mellitus with hyperglycemia: Secondary | ICD-10-CM

## 2014-09-27 DIAGNOSIS — E1129 Type 2 diabetes mellitus with other diabetic kidney complication: Secondary | ICD-10-CM

## 2014-09-27 MED ORDER — GLUCOSE BLOOD VI STRP: ORAL_STRIP | Status: DC

## 2014-09-27 NOTE — Progress Notes (Signed)
Pre visit review using our clinic review tool, if applicable. No additional management support is needed unless otherwise documented below in the visit note. Lab Results  Component Value Date   HGBA1C 8.9* 09/19/2014   HGBA1C 8.5* 07/08/2014   HGBA1C 8.9* 04/06/2014   Lab Results  Component Value Date   MICROALBUR 21.1* 07/08/2014   LDLCALC 122* 11/11/2008   CREATININE 1.3 07/08/2014

## 2014-09-27 NOTE — Patient Instructions (Signed)
Call today and make a appointment with Dr. Lorie Apley to discuss your options

## 2014-09-27 NOTE — Progress Notes (Signed)
   Subjective:    Patient ID: Charles [name], male    DOB: 1940/11/20, 74 y.o.   MRN: [id number]  HPI Charles [name] is a 74 year old male who comes in today for follow-up of diabetes type 2 uncontrolled  He's on Glucotrol 10 mg twice a day and metformin 1000 mg twice a day. A1c in October was 8.5%. We sent him to see Dr. Lorie Apley for consultation. He had a conflict and had to cancel the appointment. He's not rescheduled. A1c now is 8.9%  He says he can do better on his diet  He does not want to start insulin.  We discussed all the variables including the new oral medications. I recommend he call and make a consult appointment with Dr. Lorie Apley to discuss his options   Review of Systems Review of systems otherwise negative    Objective:   Physical Exam  Well-developed well-nourished male no acute distress vital signs stable he is afebrile      Assessment & Plan:  Diabetes type 2 uncontrolled........... consult with Dr. Lorie Apley to discuss options

## 2014-10-04 ENCOUNTER — Encounter: Payer: Self-pay | Admitting: Family Medicine

## 2014-10-04 ENCOUNTER — Ambulatory Visit (INDEPENDENT_AMBULATORY_CARE_PROVIDER_SITE_OTHER): Payer: Medicare Other | Admitting: Family Medicine

## 2014-10-04 VITALS — BP 120/88 | Temp 98.6°F | Wt 257.0 lb

## 2014-10-04 DIAGNOSIS — B079 Viral wart, unspecified: Secondary | ICD-10-CM | POA: Diagnosis not present

## 2014-10-04 DIAGNOSIS — L570 Actinic keratosis: Secondary | ICD-10-CM | POA: Diagnosis not present

## 2014-10-04 NOTE — Progress Notes (Signed)
Pre visit review using our clinic review tool, if applicable. No additional management support is needed unless otherwise documented below in the visit note. 

## 2014-10-04 NOTE — Patient Instructions (Signed)
Remove the Band-Aid tomorrow  Apply Vaseline 3-4 times daily  Return when necessary

## 2014-10-04 NOTE — Progress Notes (Signed)
   Subjective:    Patient ID: Charles [name], male    DOB: 11-21-1940, 74 y.o.   MRN: [id number]  HPI Charles [name] is a 74 year old light skin and light eyed male who comes in today for removal of the lesion on the right side of his face  He noticed this about a month ago is gone progressively bigger. He thought it was a pimple. He squeezed it method nothing came out.  Physical exam it's a 8 mm x 8 mm lesion right cheek  After informed consent the lesion was anesthetized with 1% Xylocaine with epinephrine and removed with 2 mm margins. The base was cauterized Band-Aid was applied. The lesion was sent for pathologic analysis. He tolerated the procedure no complications    Review of Systems    review of systems negative Objective:   Physical Exam  Well-developed well-nourished male no acute distress vital signs stable is afebrile procedure see above      Assessment & Plan:  Actinic care ptosis clinically......... path pending

## 2014-10-10 ENCOUNTER — Other Ambulatory Visit: Payer: Self-pay

## 2014-10-10 MED ORDER — LISINOPRIL 40 MG PO TABS: 40.0000 mg | ORAL_TABLET | Freq: Every day | ORAL | Status: DC

## 2014-10-10 NOTE — Telephone Encounter (Signed)
Rx request for Lisinopril.  Rx sent to [facility]

## 2014-10-18 NOTE — Progress Notes (Addendum)
   Subjective:    Patient ID: Charles [name], male    DOB: [birth year], 74 y.o.   MRN: [id number]  HPI    Review of Systems     Objective:   Physical Exam        Assessment & Plan:  The lesion was removed with a shave excision with a 15 blade per usual

## 2014-10-26 DIAGNOSIS — H119 Unspecified disorder of conjunctiva: Secondary | ICD-10-CM | POA: Diagnosis not present

## 2014-10-26 DIAGNOSIS — H3531 Nonexudative age-related macular degeneration: Secondary | ICD-10-CM | POA: Diagnosis not present

## 2014-11-21 ENCOUNTER — Other Ambulatory Visit: Payer: Self-pay

## 2014-11-21 DIAGNOSIS — E139 Other specified diabetes mellitus without complications: Secondary | ICD-10-CM

## 2014-11-21 MED ORDER — METFORMIN HCL 1000 MG PO TABS: 1000.0000 mg | ORAL_TABLET | Freq: Two times a day (BID) | ORAL | Status: DC

## 2014-11-21 MED ORDER — GLIPIZIDE 10 MG PO TABS: ORAL_TABLET | ORAL | Status: DC

## 2014-11-21 NOTE — Telephone Encounter (Signed)
Rx request for Glipizide and Metformin.   Pharm:  [facility]  Rx sent to pharmacy.

## 2015-02-17 ENCOUNTER — Ambulatory Visit: Payer: Self-pay | Admitting: Internal Medicine

## 2015-04-14 ENCOUNTER — Ambulatory Visit (INDEPENDENT_AMBULATORY_CARE_PROVIDER_SITE_OTHER): Payer: Medicare Other | Admitting: Internal Medicine

## 2015-04-14 ENCOUNTER — Encounter: Payer: Self-pay | Admitting: Internal Medicine

## 2015-04-14 VITALS — BP 122/86 | HR 85 | Temp 98.0°F | Resp 16 | Ht 72.0 in | Wt 239.0 lb

## 2015-04-14 DIAGNOSIS — IMO0002 Reserved for concepts with insufficient information to code with codable children: Secondary | ICD-10-CM

## 2015-04-14 DIAGNOSIS — E1129 Type 2 diabetes mellitus with other diabetic kidney complication: Secondary | ICD-10-CM | POA: Diagnosis not present

## 2015-04-14 DIAGNOSIS — E139 Other specified diabetes mellitus without complications: Secondary | ICD-10-CM

## 2015-04-14 DIAGNOSIS — E1165 Type 2 diabetes mellitus with hyperglycemia: Secondary | ICD-10-CM

## 2015-04-14 LAB — POCT GLYCOSYLATED HEMOGLOBIN (HGB A1C): Hemoglobin A1C: 5.9

## 2015-04-14 MED ORDER — GLIPIZIDE 10 MG PO TABS: 5.0000 mg | ORAL_TABLET | Freq: Two times a day (BID) | ORAL | Status: DC

## 2015-04-14 MED ORDER — LISINOPRIL 40 MG PO TABS: 40.0000 mg | ORAL_TABLET | Freq: Every day | ORAL | Status: DC

## 2015-04-14 NOTE — Progress Notes (Signed)
Patient ID: Charles [name], male   DOB: [birth year], 74 y.o.   MRN: [id number]  HPI: Charles [name] is a 74 y.o.-year-old male, returning for f/u for DM2 dx 2008, non-insulin-dependent, uncontrolled, with complications (PN, mild CKD). Last visit 11 mo ago!  Pt broke his femur in 07/2013 >> surgery >> rod placement >> was in a nursing home for rehab x 20 days. Sugars higher after this >> [facility] >> now sugars better and lost 18 lbs. He started Dr Angela Nevin diet (high protein diet, no carbs, limited amount of veggies).   Last hemoglobin A1c was: Lab Results  Component Value Date   HGBA1C 5.9 04/14/2015   HGBA1C 8.9* 09/19/2014   HGBA1C 8.5* 07/08/2014  12/2013: 8.6%  Pt is on a regimen of: - Metformin 1000 mg po bid - glipizide 10 mg bid  Pt checks his sugars once in am (meter download) - am: 70-150 >> 87-141, most 90-120 >> 110-140, occasionally 180 >> 86-135 >> 68-102, 124x1 - 2h after b'fast: 106-187 >> 78-116 - before lunch: 79-90, 157 >> n/c - 2h after lunch: 123-176 >> n/c - before dinner: 106, 125 >> n/c - 2h after dinner: 173-197 >> n/c No lows. Lowest sugar was 68; he has hypoglycemia awareness in the 60s. Highest sugar was 180 >> 160s.  [facility] >> walks, bikes.   - has mild CKD, last BUN/creatinine:  Lab Results  Component Value Date   BUN 19 07/08/2014   CREATININE 1.3 07/08/2014  of note, he had a left nephrectomy. He is on lisinopril. ACR in 03/2013: 5.3.  - He has HTG; last set of lipids: Lab Results  Component Value Date   CHOL 220* 07/08/2014   HDL 41.60 07/08/2014   LDLCALC 122* 11/11/2008   LDLDIRECT 139.8 07/08/2014   TRIG 245.0* 07/08/2014   CHOLHDL 5 07/08/2014   - last eye exam was in 09/2014 - Dr Delman Cheadle. No DR.  - + numbness and tingling in his feet >> improved. Foot exam performed 06/2013.  ROS: Constitutional: no weight gain/loss, no fatigue, no subjective hyperthermia/hypothermia, + nocturia Eyes: no blurry  vision, no xerophthalmia ENT: no sore throat, no nodules palpated in throat, no dysphagia/odynophagia, no hoarseness Cardiovascular: no CP/SOB/palpitations/+ leg swelling (periankle) Respiratory: no cough/SOB Gastrointestinal: no N/V/D/C Musculoskeletal: no muscle/joint aches Skin: + rash - stasis dermatitis R leg, + easy bruising Neurological: no tremors/numbness/tingling/dizziness  I reviewed pt's medications, allergies, PMH, social hx, family hx, and changes were documented in the history of present illness. Otherwise, unchanged from my initial visit note.  PE: BP 122/86 mmHg  Pulse 85  Temp(Src) 98 F (36.7 C) (Oral)  Resp 16  Ht 6' (1.829 m)  Wt 239 lb (108.41 kg)  BMI 32.41 kg/m2  SpO2 95% Wt Readings from Last 3 Encounters:  04/14/15 239 lb (108.41 kg)  10/04/14 257 lb (116.574 kg)  09/27/14 255 lb (115.667 kg)   Constitutional: overweight, in NAD Eyes: PERRLA, EOMI, no exophthalmos ENT: moist mucous membranes, no thyromegaly, no cervical lymphadenopathy Cardiovascular: tachycardia, RR, No MRG, + periankle edema  - mild Respiratory: CTA B Gastrointestinal: abdomen soft, NT, ND, BS+ Musculoskeletal: no deformities, strength intact in all 4 Skin: moist, warm, no rashes Neurological: no tremor with outstretched hands, DTR normal in all 4  ASSESSMENT: 1. DM2, non-insulin-dependent, uncontrolled, with complications - peripheral neuropathy - CKD  PLAN:  1. Patient with long-standing, uncontrolled diabetes, on oral antidiabetic regimen. Sugars MUCH better >>  to the point of lows after changing to a high protein diet >>  I advised him to decrease Glipizide:  Patient Instructions  Please continue Metformin 1000 mg 2x a day with meals. Decrease Glipizide to 5 mg 2x a day before meals.  Please return in 3 months with your sugar log.   Check sugars 1x a day, rotating check times.  - checked HbA1c today >> 5.8%! - refilled Lisinopril - Return to clinic in 3 months with  sugar log

## 2015-04-14 NOTE — Patient Instructions (Signed)
Patient Instructions  Please continue Metformin 1000 mg 2x a day with meals. Decrease Glipizide 5 mg 2x a day before meals.  Please return in 3 months with your sugar log.   Check sugars 1x a day, rotating check times.

## 2015-06-13 ENCOUNTER — Other Ambulatory Visit: Payer: Self-pay | Admitting: Family Medicine

## 2015-06-13 ENCOUNTER — Telehealth: Payer: Self-pay | Admitting: Family Medicine

## 2015-06-13 DIAGNOSIS — E611 Iron deficiency: Secondary | ICD-10-CM

## 2015-06-13 NOTE — Telephone Encounter (Signed)
Pls advise.  

## 2015-06-13 NOTE — Telephone Encounter (Signed)
Labs have been ordered.  Juliann Pulse, please call to schedule pt.

## 2015-06-13 NOTE — Telephone Encounter (Signed)
Pt states he tried to give blood not long ago, and they refused because his iron was so low.  Pt will switch to you, but would like to have labs done prior to his appointment, and discuss any issues he may be having. Pt not seen dr todd since 07/2014 Pt will be traveling after Nov 19 until christmas, and your first available is an afternoon appointment on Nov 18. Is it ok for him to have labs prior to his establish visit with you?

## 2015-06-13 NOTE — Telephone Encounter (Signed)
That is fine.   Add a Ferritin and TIBC

## 2015-06-16 NOTE — Telephone Encounter (Signed)
Pt has been scheduled.  °

## 2015-07-12 ENCOUNTER — Encounter: Payer: Self-pay | Admitting: Internal Medicine

## 2015-07-14 ENCOUNTER — Other Ambulatory Visit (INDEPENDENT_AMBULATORY_CARE_PROVIDER_SITE_OTHER): Payer: Medicare Other | Admitting: *Deleted

## 2015-07-14 ENCOUNTER — Encounter: Payer: Self-pay | Admitting: Internal Medicine

## 2015-07-14 ENCOUNTER — Ambulatory Visit (INDEPENDENT_AMBULATORY_CARE_PROVIDER_SITE_OTHER): Payer: Medicare Other | Admitting: Internal Medicine

## 2015-07-14 VITALS — BP 118/72 | HR 95 | Temp 97.5°F | Resp 12 | Wt 237.8 lb

## 2015-07-14 DIAGNOSIS — E1122 Type 2 diabetes mellitus with diabetic chronic kidney disease: Secondary | ICD-10-CM | POA: Diagnosis not present

## 2015-07-14 DIAGNOSIS — E1129 Type 2 diabetes mellitus with other diabetic kidney complication: Secondary | ICD-10-CM

## 2015-07-14 DIAGNOSIS — E1165 Type 2 diabetes mellitus with hyperglycemia: Secondary | ICD-10-CM | POA: Diagnosis not present

## 2015-07-14 DIAGNOSIS — Z23 Encounter for immunization: Secondary | ICD-10-CM

## 2015-07-14 DIAGNOSIS — E1159 Type 2 diabetes mellitus with other circulatory complications: Secondary | ICD-10-CM | POA: Insufficient documentation

## 2015-07-14 DIAGNOSIS — N182 Chronic kidney disease, stage 2 (mild): Secondary | ICD-10-CM

## 2015-07-14 LAB — POCT GLYCOSYLATED HEMOGLOBIN (HGB A1C): Hemoglobin A1C: 6

## 2015-07-14 NOTE — Progress Notes (Signed)
Patient ID: Charles [name], male   DOB: [birth year], 74 y.o.   MRN: [id number]  HPI: Charles [name] is a 74 y.o.-year-old male, returning for f/u for DM2 dx 2008, non-insulin-dependent, uncontrolled, with complications (PN, mild CKD). Last visit 3 mo ago.  Last hemoglobin A1c was: Lab Results  Component Value Date   HGBA1C 5.9 04/14/2015   HGBA1C 8.9* 09/19/2014   HGBA1C 8.5* 07/08/2014  12/2013: 8.6%  Pt is on a regimen of: - Metformin 1000 mg po bid - glipizide 10 > 5 mg bid  Pt checks his sugars once in am (meter download): - am: 70-150 >> 87-141, most 90-120 >> 110-140, occasionally 180 >> 86-135 >> 68-102, 124x1 >> 61, 75-120, 148 - 2h after b'fast: 106-187 >> 78-116 >> n/c - before lunch: 79-90, 157 >> n/c - 2h after lunch: 123-176 >> n/c - before dinner: 106, 125 >> n/c - 2h after dinner: 173-197 >> n/c No lows. Lowest sugar was 61; he has hypoglycemia awareness in the 60s. Highest sugar was 180 >> 160s.  [facility] >> walks, bikes.   - has mild CKD, last BUN/creatinine:  Lab Results  Component Value Date   BUN 19 07/08/2014   CREATININE 1.3 07/08/2014  of note, he had a left nephrectomy. He is on lisinopril 40. ACR in 03/2013: 5.3.  - He has HTG; last set of lipids: Lab Results  Component Value Date   CHOL 220* 07/08/2014   HDL 41.60 07/08/2014   LDLCALC 122* 11/11/2008   LDLDIRECT 139.8 07/08/2014   TRIG 245.0* 07/08/2014   CHOLHDL 5 07/08/2014   - last eye exam was in 09/2014 - Dr Delman Cheadle. No DR.  - + numbness and tingling in his feet >> improved. Foot exam performed 06/2013.  ROS: Constitutional: no weight gain/loss, no fatigue, no subjective hyperthermia/hypothermia, + nocturia Eyes: no blurry vision, no xerophthalmia ENT: no sore throat, no nodules palpated in throat, no dysphagia/odynophagia, no hoarseness Cardiovascular: no CP/SOB/palpitations/+ leg swelling (periankle) Respiratory: no cough/SOB Gastrointestinal: no  N/V/D/C Musculoskeletal: no muscle/joint aches Skin: + rash - stasis dermatitis R leg, + easy bruising Neurological: no tremors/numbness/tingling/dizziness  I reviewed pt's medications, allergies, PMH, social hx, family hx, and changes were documented in the history of present illness. Otherwise, unchanged from my initial visit note.  PE: BP 118/72 mmHg  Pulse 95  Temp(Src) 97.5 F (36.4 C) (Oral)  Resp 12  Wt 237 lb 12.8 oz (107.865 kg)  SpO2 97% Wt Readings from Last 3 Encounters:  07/14/15 237 lb 12.8 oz (107.865 kg)  04/14/15 239 lb (108.41 kg)  10/04/14 257 lb (116.574 kg)   Constitutional: overweight, in NAD Eyes: PERRLA, EOMI, no exophthalmos ENT: moist mucous membranes, no thyromegaly, no cervical lymphadenopathy Cardiovascular: tachycardia, RR, No MRG, + periankle edema  - mild - R>L Respiratory: CTA B Gastrointestinal: abdomen soft, NT, ND, BS+ Musculoskeletal: no deformities, strength intact in all 4 Skin: moist, warm, no rashes Neurological: no tremor with outstretched hands, DTR normal in all 4  ASSESSMENT: 1. DM2, non-insulin-dependent, now controlled, with complications - peripheral neuropathy - CKD  PLAN:  1. Patient with long-standing, now controlled diabetes, on oral antidiabetic regimen. Sugars mostly at goal >> continue the lower Glipizide dose: Patient Instructions  Please continue Metformin 1000 mg 2x a day with meals. Continue Glipizide 5 mg 2x a day before meals.  Please return in 3 months with your sugar log.   Check sugars 1x a day, rotating check times.  -  checked HbA1c today >> 6.0% (slightly higher) - will give him the flu shot - Return to clinic in 3 months with sugar log

## 2015-07-14 NOTE — Patient Instructions (Signed)
Patient Instructions  Please continue Metformin 1000 mg 2x a day with meals. Continue Glipizide 5 mg 2x a day before meals.  Please return in 3 months with your sugar log.   Check sugars 1x a day, rotating check times.

## 2015-07-21 ENCOUNTER — Other Ambulatory Visit (INDEPENDENT_AMBULATORY_CARE_PROVIDER_SITE_OTHER): Payer: Medicare Other

## 2015-07-21 DIAGNOSIS — Z0000 Encounter for general adult medical examination without abnormal findings: Secondary | ICD-10-CM

## 2015-07-21 DIAGNOSIS — E785 Hyperlipidemia, unspecified: Secondary | ICD-10-CM

## 2015-07-21 DIAGNOSIS — D509 Iron deficiency anemia, unspecified: Secondary | ICD-10-CM | POA: Diagnosis not present

## 2015-07-21 DIAGNOSIS — I10 Essential (primary) hypertension: Secondary | ICD-10-CM

## 2015-07-21 DIAGNOSIS — Z125 Encounter for screening for malignant neoplasm of prostate: Secondary | ICD-10-CM

## 2015-07-21 DIAGNOSIS — E611 Iron deficiency: Secondary | ICD-10-CM

## 2015-07-21 LAB — POCT URINALYSIS DIPSTICK
BILIRUBIN UA: NEGATIVE
GLUCOSE UA: NEGATIVE
Ketones, UA: NEGATIVE
Leukocytes, UA: NEGATIVE
NITRITE UA: NEGATIVE
RBC UA: NEGATIVE
Spec Grav, UA: 1.02
Urobilinogen, UA: 0.2
pH, UA: 6.5

## 2015-07-21 LAB — BASIC METABOLIC PANEL
BUN: 19 mg/dL (ref 6–23)
CHLORIDE: 103 meq/L (ref 96–112)
CO2: 30 mEq/L (ref 19–32)
Calcium: 9.3 mg/dL (ref 8.4–10.5)
Creatinine, Ser: 1.21 mg/dL (ref 0.40–1.50)
GFR: 62.25 mL/min (ref >60.00)
Glucose, Bld: 109 mg/dL — ABNORMAL HIGH (ref 70–99)
POTASSIUM: 5 meq/L (ref 3.5–5.1)
Sodium: 141 mEq/L (ref 135–145)

## 2015-07-21 LAB — CBC WITH DIFFERENTIAL/PLATELET
BASOS PCT: 0.9 % (ref 0.0–3.0)
Basophils Absolute: 0 10*3/uL (ref 0.0–0.1)
EOS PCT: 6.2 % — AB (ref 0.0–5.0)
Eosinophils Absolute: 0.3 10*3/uL (ref 0.0–0.7)
HEMATOCRIT: 39.5 % (ref 39.0–52.0)
HEMOGLOBIN: 12.7 g/dL — AB (ref 13.0–17.0)
LYMPHS PCT: 28 % (ref 12.0–46.0)
Lymphs Abs: 1.2 10*3/uL (ref 0.7–4.0)
MCHC: 32.2 g/dL (ref 30.0–36.0)
MCV: 87.3 fl (ref 78.0–100.0)
Monocytes Absolute: 0.4 10*3/uL (ref 0.1–1.0)
Monocytes Relative: 10.1 % (ref 3.0–12.0)
Neutro Abs: 2.4 10*3/uL (ref 1.4–7.7)
Neutrophils Relative %: 54.8 % (ref 43.0–77.0)
Platelets: 234 10*3/uL (ref 150.0–400.0)
RBC: 4.53 Mil/uL (ref 4.22–5.81)
RDW: 17.7 % — AB (ref 11.5–15.5)
WBC: 4.4 10*3/uL (ref 4.0–10.5)

## 2015-07-21 LAB — LIPID PANEL
CHOL/HDL RATIO: 4
CHOLESTEROL: 186 mg/dL (ref 0–200)
HDL: 44.1 mg/dL (ref >39.00)
LDL Cholesterol: 106 mg/dL — ABNORMAL HIGH (ref 0–99)
NonHDL: 142.3
TRIGLYCERIDES: 182 mg/dL — AB (ref 0.0–149.0)
VLDL: 36.4 mg/dL (ref 0.0–40.0)

## 2015-07-21 LAB — TSH: TSH: 1.59 u[IU]/mL (ref 0.35–4.50)

## 2015-07-21 LAB — MICROALBUMIN / CREATININE URINE RATIO
CREATININE, U: 107.3 mg/dL
Microalb Creat Ratio: 10.6 mg/g (ref 0.0–30.0)
Microalb, Ur: 11.4 mg/dL — ABNORMAL HIGH (ref 0.0–1.9)

## 2015-07-21 LAB — HEPATIC FUNCTION PANEL
ALT: 28 U/L (ref 0–53)
AST: 17 U/L (ref 0–37)
Albumin: 4.2 g/dL (ref 3.5–5.2)
Alkaline Phosphatase: 57 U/L (ref 39–117)
BILIRUBIN DIRECT: 0.1 mg/dL (ref 0.0–0.3)
TOTAL PROTEIN: 6.6 g/dL (ref 6.0–8.3)
Total Bilirubin: 0.5 mg/dL (ref 0.2–1.2)

## 2015-07-21 LAB — HEMOGLOBIN A1C: HEMOGLOBIN A1C: 6.4 % (ref 4.6–6.5)

## 2015-07-21 LAB — PSA: PSA: 1.05 ng/mL (ref 0.10–4.00)

## 2015-07-21 LAB — FERRITIN: Ferritin: 8 ng/mL — ABNORMAL LOW (ref 22.0–322.0)

## 2015-07-28 ENCOUNTER — Ambulatory Visit (INDEPENDENT_AMBULATORY_CARE_PROVIDER_SITE_OTHER): Payer: Medicare Other | Admitting: Adult Health

## 2015-07-28 ENCOUNTER — Encounter: Payer: Self-pay | Admitting: Adult Health

## 2015-07-28 VITALS — BP 120/70 | Temp 98.6°F | Ht 72.0 in | Wt 241.0 lb

## 2015-07-28 DIAGNOSIS — Z0000 Encounter for general adult medical examination without abnormal findings: Secondary | ICD-10-CM

## 2015-07-28 DIAGNOSIS — E1129 Type 2 diabetes mellitus with other diabetic kidney complication: Secondary | ICD-10-CM

## 2015-07-28 DIAGNOSIS — IMO0002 Reserved for concepts with insufficient information to code with codable children: Secondary | ICD-10-CM

## 2015-07-28 DIAGNOSIS — I10 Essential (primary) hypertension: Secondary | ICD-10-CM

## 2015-07-28 DIAGNOSIS — E1165 Type 2 diabetes mellitus with hyperglycemia: Secondary | ICD-10-CM

## 2015-07-28 NOTE — Patient Instructions (Signed)
It was great meeting you today.   Follow up with me in one year for your next physical, unless you need me otherwise.   Have a great holiday season.

## 2015-07-28 NOTE — Progress Notes (Signed)
HPI:  Charles [name] is here to establish care and for his complete physical.  He is a pleasant causcasian male  has a past medical history of Hyperlipidemia; Obesity; Diabetes mellitus; Hypertension; Eczema; Biceps muscle tear; Hernia, ventral; Cancer of kidney ([place]) (2000); AVM (arteriovenous malformation) of colon; and Personal history of adenomatous colonic polyps (07/20/2012).  Last PCP and physical: 07/2014 with MD Sherren Mocha Immunizations:UTD Diet:Eats healthy Exercise: Exercises five days a week.  Colonoscopy: 2018 - Hx of polyps  Is Followed by  - Endocrinology.    Has the following chronic problems that require follow up and concerns today:  DM II - This is managed by Dr. Kathleene Hazel with endocrinology. Currently not on insulin. Taking metformin 1000 mg twice a day with meals as well as glipizide 5 mg twice a day before meals. He last saw Dr. Julianne Rice on 07/14/2015. His last A1c was 6.4. Current weight is 241 he has gained 4 pounds since his last visit.  Essential Hypertension - Early taking 40 mg lisinopril. He feels as though this is well controlled. BP: 120/70 mmHg he has no complaints of highs or lows.   ROS negative for unless reported above: fevers, chills,feeling poorly, unintentional weight loss, hearing or vision loss, chest pain, palpitations, leg claudication, struggling to breath,Not feeling congested in the chest, no orthopenia, no cough,no wheezing, normal appetite, no soft tissue swelling, no hemoptysis, melena, hematochezia, hematuria, falls, loc, si, or thoughts of self harm.    Past Medical History  Diagnosis Date  . Hyperlipidemia   . Obesity   . Diabetes mellitus   . Hypertension   . Eczema   . Biceps muscle tear     right  . Hernia, ventral   . Cancer of kidney ([place]) 2000    left nephrectomy  . AVM (arteriovenous malformation) of colon     2 - non-bleeding 2013  . Personal history of adenomatous colonic polyps 07/20/2012    3 + adenomas 2009  07/20/2012 2 diminutive polyps      Past Surgical History  Procedure Laterality Date  . Inguinal hernia repair      left, x 3  . Inguinal hernia repair  2012    right  . Left nephrectomy  2000  . Femur im nail Right 07/26/2013    Procedure: INTRAMEDULLARY (IM) NAIL FEMORAL subtrochanteric;  Surgeon: Mauri Pole, MD;  Location: [place];  Service: Orthopedics;  Laterality: Right;    Family History  Problem Relation Age of Onset  . Drug abuse Other   . Cancer Other   . Heart disease Other   . Lung disease Other   . Colon cancer Neg Hx   . Stomach cancer Neg Hx     Social History   Social History  . Marital Status: Single    Spouse Name: N/A  . Number of Children: N/A  . Years of Education: N/A   Social History Main Topics  . Smoking status: Former Smoker    Quit date: 07/06/2012  . Smokeless tobacco: Never Used  . Alcohol Use: 0.6 oz/week    1 Cans of beer per week  . Drug Use: No  . Sexual Activity: Not Asked   Other Topics Concern  . None   Social History Narrative   Regular exercise: goes to the [facility]   Caffeine use: daily; coffee     Current outpatient prescriptions:  .  glipiZIDE (GLUCOTROL) 10 MG tablet, Take 0.5 tablets (5 mg total) by mouth 2 (two)  times daily before a meal. 1 by mouth twice a day, Disp: 180 tablet, Rfl: 1 .  glucose blood (ONETOUCH VERIO) test strip, Test once daily.  Dx E11.9, Disp: 100 each, Rfl: 12 .  lisinopril (PRINIVIL,ZESTRIL) 40 MG tablet, Take 1 tablet (40 mg total) by mouth daily., Disp: 90 tablet, Rfl: 1 .  metFORMIN (GLUCOPHAGE) 1000 MG tablet, Take 1 tablet (1,000 mg total) by mouth 2 (two) times daily with a meal., Disp: 200 tablet, Rfl: 1 .  triamcinolone (KENALOG) 0.025 % ointment, Apply 1 application topically 2 (two) times daily., Disp: 30 g, Rfl: 2 .  EPINEPHrine (EPIPEN 2-PAK) 0.3 mg/0.3 mL DEVI, Inject 0.3 mg into the muscle once.  , Disp: , Rfl:   EXAM:  Filed Vitals:   07/28/15 1409  BP: 120/70  Temp: 98.6 F  (37 C)    Body mass index is 32.68 kg/(m^2).  Constitutional: He is oriented to person, place, and time. He appears well-developed and well-nourished.  HENT:  Head: Normocephalic and atraumatic.  Right Ear: External ear normal.  Left Ear: External ear normal.  Nose: Nose normal.  Mouth/Throat: Oropharynx is clear and moist.  Eyes: Conjunctivae and EOM are normal. Pupils are equal, round, and reactive to light.  Neck: Normal range of motion. Neck supple. No JVD present. No tracheal deviation present. No thyromegaly present.  Cardiovascular: Normal rate, regular rhythm, normal heart sounds and intact distal pulses. Exam reveals no gallop and no friction rub. no carotid nor aortic bruits peripheral pulses 2+ and symmetrical No murmur heard. Pulmonary/Chest: Effort normal and breath sounds normal. No stridor. No respiratory distress. He has no wheezes. He has no rales. He exhibits no tenderness.  Abdominal: Soft. Bowel sounds are normal. He exhibits no tenderness. There is no rebound and no guarding. He does have a rather large ventral hernia  Genitourinary: Rectum normal, prostate normal and penis normal. Guaiac negative stool. No penile tenderness.  Musculoskeletal: Normal range of motion. He exhibits no edema or tenderness.  Lymphadenopathy:   He has no cervical adenopathy.  Neurological: He is alert and oriented to person, place, and time. He has normal reflexes. No cranial nerve deficit. He exhibits normal muscle tone.  Skin: Skin is warm and dry. No rash noted. No erythema. No pallor. Scar on abdomen from prior kidney surgery  Psychiatric: He has a normal mood and affect. His behavior is normal. Judgment and thought content normal.  Nursing note and vitals reviewed.  ASSESSMENT AND PLAN:  1. Routine general medical examination at a health care facility -Labs reviewed with the patient -All vaccinations up-to-date -He has a living will and advanced directives - EKG  12-Lead-Sinus  Rhythm  -Prominent R(V1) and right axis -consider right ventricular hypertrophy  -consider pulmonary disease. Rate 84 -Follow-up 1 year for complete physical, follow up sooner for any acute issues  2. Uncontrolled type 2 diabetes mellitus with other diabetic kidney complication, without long-term current use of insulin ([place]) -Continue with current plan set forth by endocrinology -Tinny monitor blood sugar at home  3. Essential hypertension -Continue with lisinopril 40 mg, no change in medication at this time. -Tinny to monitor blood pressures at home   -We reviewed the PMH, PSH, FH, SH, Meds and Allergies. -We provided refills for any medications we will prescribe as needed. -We addressed current concerns per orders and patient instructions. -We have asked for records for pertinent exams, studies, vaccines and notes from previous providers. -We have advised patient to follow up per instructions below.   -  Patient advised to return or notify a provider immediately if symptoms worsen or persist or new concerns arise.   [employer]

## 2015-07-28 NOTE — Progress Notes (Signed)
Pre visit review using our clinic review tool, if applicable. No additional management support is needed unless otherwise documented below in the visit note. 

## 2015-08-11 ENCOUNTER — Other Ambulatory Visit: Payer: Self-pay | Admitting: *Deleted

## 2015-08-11 ENCOUNTER — Encounter: Payer: Self-pay | Admitting: Internal Medicine

## 2015-08-11 DIAGNOSIS — E139 Other specified diabetes mellitus without complications: Secondary | ICD-10-CM

## 2015-08-11 MED ORDER — GLIPIZIDE 10 MG PO TABS: 5.0000 mg | ORAL_TABLET | Freq: Two times a day (BID) | ORAL | Status: DC

## 2015-08-11 MED ORDER — LISINOPRIL 40 MG PO TABS: 40.0000 mg | ORAL_TABLET | Freq: Every day | ORAL | Status: DC

## 2015-08-11 MED ORDER — METFORMIN HCL 1000 MG PO TABS: 1000.0000 mg | ORAL_TABLET | Freq: Two times a day (BID) | ORAL | Status: DC

## 2015-08-14 ENCOUNTER — Other Ambulatory Visit: Payer: Self-pay | Admitting: *Deleted

## 2015-08-14 ENCOUNTER — Encounter: Payer: Self-pay | Admitting: Internal Medicine

## 2015-08-14 DIAGNOSIS — E139 Other specified diabetes mellitus without complications: Secondary | ICD-10-CM

## 2015-08-14 MED ORDER — GLIPIZIDE 10 MG PO TABS: 5.0000 mg | ORAL_TABLET | Freq: Two times a day (BID) | ORAL | Status: DC

## 2015-08-14 NOTE — Telephone Encounter (Signed)
[facility] did not receive the rx refill for glipizide. Resending.

## 2015-10-02 ENCOUNTER — Other Ambulatory Visit: Payer: Self-pay | Admitting: Internal Medicine

## 2015-10-13 ENCOUNTER — Encounter: Payer: Self-pay | Admitting: Internal Medicine

## 2015-10-13 ENCOUNTER — Ambulatory Visit (INDEPENDENT_AMBULATORY_CARE_PROVIDER_SITE_OTHER): Payer: Medicare Other | Admitting: Internal Medicine

## 2015-10-13 VITALS — BP 138/86 | HR 85 | Temp 97.6°F | Ht 71.25 in | Wt 247.0 lb

## 2015-10-13 DIAGNOSIS — E1122 Type 2 diabetes mellitus with diabetic chronic kidney disease: Secondary | ICD-10-CM

## 2015-10-13 DIAGNOSIS — G6289 Other specified polyneuropathies: Secondary | ICD-10-CM | POA: Diagnosis not present

## 2015-10-13 DIAGNOSIS — G629 Polyneuropathy, unspecified: Secondary | ICD-10-CM | POA: Insufficient documentation

## 2015-10-13 DIAGNOSIS — N182 Chronic kidney disease, stage 2 (mild): Secondary | ICD-10-CM

## 2015-10-13 LAB — VITAMIN B12: Vitamin B-12: 94 pg/mL — ABNORMAL LOW (ref 211–911)

## 2015-10-13 LAB — POCT GLYCOSYLATED HEMOGLOBIN (HGB A1C): Hemoglobin A1C: 6.3

## 2015-10-13 NOTE — Progress Notes (Signed)
Pre visit review using our clinic review tool, if applicable. No additional management support is needed unless otherwise documented below in the visit note. 

## 2015-10-13 NOTE — Progress Notes (Signed)
Patient ID: Charles [name], male   DOB: Aug 08, 1941, 75 y.o.   MRN: [id number]  HPI: Charles [name] is a 75 y.o.-year-old male, returning for f/u for DM2 dx 2008, non-insulin-dependent, uncontrolled, with complications (PN, mild CKD). Last visit 3 mo ago.   He had slightly higher sugars over the Holidays.  Last hemoglobin A1c was: Lab Results  Component Value Date   HGBA1C 6.4 07/21/2015   HGBA1C 6.0 07/14/2015   HGBA1C 5.9 04/14/2015  12/2013: 8.6%  Pt is on a regimen of: - Metformin 1000 mg po bid - glipizide 5 mg bid  Pt checks his sugars once in am (meter download): - am: 70-150 >> 87-141, most 90-120 >> 110-140, occasionally 180 >> 86-135 >> 68-102, 124x1 >> 61, 75-120, 148 >>  104-140 - 2h after b'fast: 106-187 >> 78-116 >> n/c - before lunch: 79-90, 157 >> n/c - 2h after lunch: 123-176 >> n/c - before dinner: 106, 125 >> n/c - 2h after dinner: 173-197 >> n/c No lows. Lowest sugar was 61; he has hypoglycemia awareness in the 60s. Highest sugar was 180 >> 160s >> 267 on 09/09/2014 - repeated the measurement: 160.  [facility] >> walks, bikes.   - has mild CKD, last BUN/creatinine:  Lab Results  Component Value Date   BUN 19 07/21/2015   CREATININE 1.21 07/21/2015  of note, he had a left nephrectomy. He is on lisinopril 40. ACR in 03/2013: 5.3.  - He has HTG; last set of lipids: Lab Results  Component Value Date   CHOL 186 07/21/2015   HDL 44.10 07/21/2015   LDLCALC 106* 07/21/2015   LDLDIRECT 139.8 07/08/2014   TRIG 182.0* 07/21/2015   CHOLHDL 4 07/21/2015   - last eye exam was in 09/2014 - Dr Delman Cheadle. No DR. Has one this month. - + numbness and tingling in his feet >> improved. Foot exam performed 06/2013.  ROS: Constitutional: no weight gain/loss, no fatigue, no subjective hyperthermia/hypothermia, no nocturia Eyes: no blurry vision, no xerophthalmia ENT: no sore throat, no nodules palpated in throat, no dysphagia/odynophagia, no  hoarseness Cardiovascular: no CP/SOB/palpitations/+ leg swelling (periankle) Respiratory: no cough/SOB Gastrointestinal: no N/V/D/C Musculoskeletal: no muscle/joint aches Skin: + rash - stasis dermatitis R leg, no easy bruising Neurological: no tremors/+ feet numbness/no tingling/dizziness  I reviewed pt's medications, allergies, PMH, social hx, family hx, and changes were documented in the history of present illness. Otherwise, unchanged from my initial visit note.  PE: BP 138/86 mmHg  Pulse 85  Temp(Src) 97.6 F (36.4 C) (Oral)  Ht 5' 11.25" (1.81 m)  Wt 247 lb (112.038 kg)  BMI 34.20 kg/m2  SpO2 95% Wt Readings from Last 3 Encounters:  10/13/15 247 lb (112.038 kg)  07/28/15 241 lb (109.317 kg)  07/14/15 237 lb 12.8 oz (107.865 kg)   Constitutional: overweight, in NAD Eyes: PERRLA, EOMI, no exophthalmos ENT: moist mucous membranes, no thyromegaly, no cervical lymphadenopathy Cardiovascular: tachycardia, RR, No MRG, + periankle edema  - mild - R>L Respiratory: CTA B Gastrointestinal: abdomen soft, NT, ND, BS+ Musculoskeletal: no deformities, strength intact in all 4 Skin: moist, warm, no rashes Neurological: no tremor with outstretched hands, DTR normal in all 4  ASSESSMENT: 1. DM2, non-insulin-dependent, now controlled, with complications - peripheral neuropathy - CKD  2. Peripheral neuropathy  PLAN:  1. Patient with long-standing, now controlled diabetes, on oral antidiabetic regimen. Sugars mostly at goal >> continue the same regimen:  Patient Instructions  Please continue Metformin 1000  mg 2x a day with meals. Continue Glipizide 5 mg 2x a day before meals.  Please return in 3 months with your sugar log.   Please stop at the lab.  Check sugars 1x a day, rotating check times.  - checked HbA1c today >> 6.3% (great) - gave him the flu shot this season - Return to clinic in 3 months with sugar log   2. PN - since he is on Metformin, will check a B12  vitamin - we discussed that Metformin may decrease B12 levels - discussed different B12 formulations  Office Visit on 10/13/2015  Component Date Value Ref Range Status  . Vitamin B-12 10/13/2015 94* 211 - 911 pg/mL Final  . 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 He will need to start B12 injections. I would let him decide whether he wants to have them here or in his PCPs office. After the first 6 months, if he improves his B12 status, he can maintain his levels with po B12.

## 2015-10-13 NOTE — Patient Instructions (Signed)
Patient Instructions  Please continue Metformin 1000 mg 2x a day with meals. Continue Glipizide 5 mg 2x a day before meals.  Please return in 3 months with your sugar log.   Check sugars 1x a day, rotating check times.   

## 2015-10-17 ENCOUNTER — Ambulatory Visit (INDEPENDENT_AMBULATORY_CARE_PROVIDER_SITE_OTHER): Payer: Medicare Other | Admitting: *Deleted

## 2015-10-17 ENCOUNTER — Other Ambulatory Visit (INDEPENDENT_AMBULATORY_CARE_PROVIDER_SITE_OTHER): Payer: Medicare Other | Admitting: *Deleted

## 2015-10-17 DIAGNOSIS — E538 Deficiency of other specified B group vitamins: Secondary | ICD-10-CM | POA: Diagnosis not present

## 2015-10-17 MED ORDER — CYANOCOBALAMIN 1000 MCG/ML IJ SOLN
1000.0000 ug | Freq: Once | INTRAMUSCULAR | Status: AC
  Administered 2015-10-17: 1000 ug via INTRAMUSCULAR

## 2015-10-18 ENCOUNTER — Emergency Department (HOSPITAL_COMMUNITY): Payer: Medicare Other

## 2015-10-18 ENCOUNTER — Inpatient Hospital Stay (HOSPITAL_COMMUNITY)
Admission: EM | Admit: 2015-10-18 | Discharge: 2015-10-22 | DRG: 247 | Disposition: A | Discharge status: Home / Self Care | Payer: Medicare Other | Attending: Cardiology | Admitting: Cardiology

## 2015-10-18 ENCOUNTER — Encounter (HOSPITAL_COMMUNITY): Payer: Self-pay | Admitting: Emergency Medicine

## 2015-10-18 DIAGNOSIS — Q2733 Arteriovenous malformation of digestive system vessel: Secondary | ICD-10-CM

## 2015-10-18 DIAGNOSIS — Z955 Presence of coronary angioplasty implant and graft: Secondary | ICD-10-CM

## 2015-10-18 DIAGNOSIS — N179 Acute kidney failure, unspecified: Secondary | ICD-10-CM | POA: Diagnosis not present

## 2015-10-18 DIAGNOSIS — E119 Type 2 diabetes mellitus without complications: Secondary | ICD-10-CM | POA: Diagnosis present

## 2015-10-18 DIAGNOSIS — Z87891 Personal history of nicotine dependence: Secondary | ICD-10-CM | POA: Diagnosis not present

## 2015-10-18 DIAGNOSIS — Z79899 Other long term (current) drug therapy: Secondary | ICD-10-CM

## 2015-10-18 DIAGNOSIS — T508X5A Adverse effect of diagnostic agents, initial encounter: Secondary | ICD-10-CM | POA: Diagnosis not present

## 2015-10-18 DIAGNOSIS — I472 Ventricular tachycardia: Secondary | ICD-10-CM | POA: Diagnosis not present

## 2015-10-18 DIAGNOSIS — D649 Anemia, unspecified: Secondary | ICD-10-CM

## 2015-10-18 DIAGNOSIS — Z6833 Body mass index (BMI) 33.0-33.9, adult: Secondary | ICD-10-CM

## 2015-10-18 DIAGNOSIS — R0789 Other chest pain: Secondary | ICD-10-CM | POA: Diagnosis not present

## 2015-10-18 DIAGNOSIS — Z85528 Personal history of other malignant neoplasm of kidney: Secondary | ICD-10-CM

## 2015-10-18 DIAGNOSIS — I214 Non-ST elevation (NSTEMI) myocardial infarction: Principal | ICD-10-CM | POA: Diagnosis present

## 2015-10-18 DIAGNOSIS — I119 Hypertensive heart disease without heart failure: Secondary | ICD-10-CM | POA: Diagnosis not present

## 2015-10-18 DIAGNOSIS — D500 Iron deficiency anemia secondary to blood loss (chronic): Secondary | ICD-10-CM | POA: Diagnosis present

## 2015-10-18 DIAGNOSIS — Z7984 Long term (current) use of oral hypoglycemic drugs: Secondary | ICD-10-CM

## 2015-10-18 DIAGNOSIS — I480 Paroxysmal atrial fibrillation: Secondary | ICD-10-CM | POA: Diagnosis present

## 2015-10-18 DIAGNOSIS — R079 Chest pain, unspecified: Secondary | ICD-10-CM | POA: Diagnosis not present

## 2015-10-18 DIAGNOSIS — I10 Essential (primary) hypertension: Secondary | ICD-10-CM | POA: Diagnosis present

## 2015-10-18 DIAGNOSIS — Z86010 Personal history of colonic polyps: Secondary | ICD-10-CM

## 2015-10-18 DIAGNOSIS — Z905 Acquired absence of kidney: Secondary | ICD-10-CM

## 2015-10-18 DIAGNOSIS — Z833 Family history of diabetes mellitus: Secondary | ICD-10-CM

## 2015-10-18 DIAGNOSIS — E785 Hyperlipidemia, unspecified: Secondary | ICD-10-CM | POA: Diagnosis present

## 2015-10-18 HISTORY — DX: Unspecified osteoarthritis, unspecified site: M19.90

## 2015-10-18 HISTORY — DX: Gastro-esophageal reflux disease without esophagitis: K21.9

## 2015-10-18 HISTORY — DX: Pneumonia, unspecified organism: J18.9

## 2015-10-18 HISTORY — DX: Type 2 diabetes mellitus without complications: E11.9

## 2015-10-18 HISTORY — DX: Anemia, unspecified: D64.9

## 2015-10-18 HISTORY — DX: Atherosclerotic heart disease of native coronary artery without angina pectoris: I25.10

## 2015-10-18 LAB — BASIC METABOLIC PANEL
Anion gap: 13 (ref 5–15)
BUN: 19 mg/dL (ref 6–20)
CHLORIDE: 106 mmol/L (ref 101–111)
CO2: 21 mmol/L — AB (ref 22–32)
Calcium: 8.9 mg/dL (ref 8.9–10.3)
Creatinine, Ser: 1.23 mg/dL (ref 0.61–1.24)
GFR calc Af Amer: >60 mL/min (ref >60)
GFR calc non Af Amer: 56 mL/min — ABNORMAL LOW (ref >60)
GLUCOSE: 274 mg/dL — AB (ref 65–99)
POTASSIUM: 4.8 mmol/L (ref 3.5–5.1)
Sodium: 140 mmol/L (ref 135–145)

## 2015-10-18 LAB — DIFFERENTIAL
Basophils Absolute: 0 10*3/uL (ref 0.0–0.1)
Basophils Relative: 1 %
EOS ABS: 0.3 10*3/uL (ref 0.0–0.7)
EOS PCT: 6 %
Lymphocytes Relative: 27 %
Lymphs Abs: 1.3 10*3/uL (ref 0.7–4.0)
MONO ABS: 0.6 10*3/uL (ref 0.1–1.0)
Monocytes Relative: 12 %
Neutro Abs: 2.6 10*3/uL (ref 1.7–7.7)
Neutrophils Relative %: 54 %

## 2015-10-18 LAB — I-STAT TROPONIN, ED: Troponin i, poc: 0.21 ng/mL (ref 0.00–0.08)

## 2015-10-18 LAB — CBC
HEMATOCRIT: 37.4 % — AB (ref 39.0–52.0)
Hemoglobin: 12.3 g/dL — ABNORMAL LOW (ref 13.0–17.0)
MCH: 29.1 pg (ref 26.0–34.0)
MCHC: 32.9 g/dL (ref 30.0–36.0)
MCV: 88.6 fL (ref 78.0–100.0)
Platelets: 204 10*3/uL (ref 150–400)
RBC: 4.22 MIL/uL (ref 4.22–5.81)
RDW: 15.5 % (ref 11.5–15.5)
WBC: 4.8 10*3/uL (ref 4.0–10.5)

## 2015-10-18 MED ORDER — HEPARIN BOLUS VIA INFUSION
3000.0000 [IU] | Freq: Once | INTRAVENOUS | Status: DC
  Filled 2015-10-18: qty 3000

## 2015-10-18 MED ORDER — NITROGLYCERIN 0.4 MG SL SUBL
0.4000 mg | SUBLINGUAL_TABLET | SUBLINGUAL | Status: DC | PRN
  Administered 2015-10-18 (×2): 0.4 mg via SUBLINGUAL
  Filled 2015-10-18: qty 1

## 2015-10-18 MED ORDER — HEPARIN (PORCINE) IN NACL 100-0.45 UNIT/ML-% IJ SOLN
1400.0000 [IU]/h | INTRAMUSCULAR | Status: DC
  Administered 2015-10-18: 1400 [IU]/h via INTRAVENOUS
  Filled 2015-10-18: qty 250

## 2015-10-18 MED ORDER — NITROGLYCERIN IN D5W 200-5 MCG/ML-% IV SOLN
10.0000 ug/min | INTRAVENOUS | Status: DC
  Administered 2015-10-18: 15 ug/min via INTRAVENOUS
  Filled 2015-10-18: qty 250

## 2015-10-18 NOTE — Progress Notes (Signed)
ANTICOAGULATION CONSULT NOTE - Initial Consult  Pharmacy Consult for heparin Indication: chest pain/ACS  No Known Allergies  Patient Measurements: Height: 6' (182.9 cm) Weight: 245 lb (111.131 kg) IBW/kg (Calculated) : 77.6 Heparin Dosing Weight: 105kg  Vital Signs: Temp: 98.1 F (36.7 C) (02/08 2301) Temp Source: Oral (02/08 2301) BP: 146/96 mmHg (02/08 2314) Pulse Rate: 97 (02/08 2301)  Labs:  Recent Labs  10/18/15 2253  HGB 12.3*  HCT 37.4*  PLT 204    Medical History: Past Medical History  Diagnosis Date  . Hyperlipidemia   . Obesity   . Diabetes mellitus   . Hypertension   . Eczema   . Biceps muscle tear     right  . Hernia, ventral   . Cancer of kidney ([place]) 2000    left nephrectomy  . AVM (arteriovenous malformation) of colon     2 - non-bleeding 2013  . Personal history of adenomatous colonic polyps 07/20/2012    3 + adenomas 2009 07/20/2012 2 diminutive polyps      Assessment: 75yo male c/o chest tightness associated w/ SOB and nausea, initial i-stat troponin elevated, to begin heparin.  Goal of Therapy:  Heparin level 0.3-0.7 units/ml Monitor platelets by anticoagulation protocol: Yes   Plan:  Will give heparin 3000 units IV bolus x1 followed by gtt at 1400 units/hr and monitor heparin levels and CBC.  Wynona Neat, PharmD, BCPS  10/18/2015,11:27 PM

## 2015-10-18 NOTE — ED Provider Notes (Signed)
CSN: [id number]     Arrival date & time 10/18/15  2242 History   By signing my name below, I, Charles [name], attest that this documentation has been prepared under the direction and in the presence of Delora Fuel, MD . Electronically Signed: Evelene [name], Scribe. 10/18/2015. 11:13 PM.  Chief Complaint  Patient presents with  . Chest Pain     The history is provided by the patient. No language interpreter was used.   HPI Comments:  Charles [name] is a 75 y.o. male with a history of HLD, DM and HTN, who presents to the Emergency Department via EMS complaining of right sided CP which began ~ 2000/2100 tonight while pt was getting ready to eat a snack.  He describes his symptom as a tightness and notes pain radiates into his RUE. He reports associated nausea and mild SOB. He denies diaphoresis and h/o similar episodes. Pt also denies smoking history and notes he drinks sparingly. At this time his pain is a 3/10 after receiving nitro and 324 mg ASA en route; notes his pain was a 4-5/10 prior to that.   Past Medical History  Diagnosis Date  . Hyperlipidemia   . Obesity   . Diabetes mellitus   . Hypertension   . Eczema   . Biceps muscle tear     right  . Hernia, ventral   . 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 AVM (arteriovenous malformation) of colon     2 - non-bleeding 2013  . Personal history of adenomatous colonic polyps 07/20/2012    3 + adenomas 2009 07/20/2012 2 diminutive polyps     Past Surgical History  Procedure Laterality Date  . Inguinal hernia repair      left, x 3  . Inguinal hernia repair  2012    right  . Left nephrectomy  2000  . Femur im nail Right 07/26/2013    Procedure: INTRAMEDULLARY (IM) NAIL FEMORAL subtrochanteric;  Surgeon: Mauri Pole, MD;  Location: [facility];  Service: Orthopedics;  Laterality: Right;   Family History  Problem Relation Age of Onset  . Drug abuse Other   . Cancer Other   . Heart disease Other   . Lung disease Other   .  Colon cancer Neg Hx   . Stomach cancer Neg Hx   . Diabetes Mother   . Diabetes Sister    Social History  Substance Use Topics  . Smoking status: Former Smoker    Quit date: 07/06/2012  . Smokeless tobacco: Never Used  . Alcohol Use: 0.6 oz/week    1 Cans of beer per week     Comment: occasional    Review of Systems  Constitutional: Negative for diaphoresis.  Respiratory: Positive for shortness of breath.   Cardiovascular: Positive for chest pain.  Gastrointestinal: Positive for nausea. Negative for vomiting.  All other systems reviewed and are negative.   Allergies  Review of patient's allergies indicates no known allergies.  Home Medications   Prior to Admission medications   Medication Sig Start Date End Date Taking? Authorizing Provider  Cyanocobalamin (VITAMIN B-12 IJ) Inject 1 mL as directed every 30 (thirty) days.   Yes Historical Provider, MD  EPINEPHrine (EPIPEN 2-PAK) 0.3 mg/0.3 mL DEVI Inject 0.3 mg into the muscle once.     Yes Historical Provider, MD  glipiZIDE (GLUCOTROL) 10 MG tablet Take one-half tablet by  mouth two times daily  before meals 10/02/15  Yes Philemon Kingdom, MD  lisinopril (PRINIVIL,ZESTRIL) 40 MG tablet Take 1 tablet (40 mg total) by mouth daily. 08/11/15  Yes Philemon Kingdom, MD  metFORMIN (GLUCOPHAGE) 1000 MG tablet Take 1 tablet (1,000 mg total) by mouth 2 (two) times daily with a meal. 08/11/15  Yes Philemon Kingdom, MD  glucose blood (ONETOUCH VERIO) test strip Test once daily.  Dx E11.9 09/27/14   Dorena Cookey, MD  triamcinolone (KENALOG) 0.025 % ointment Apply 1 application topically 2 (two) times daily. Patient not taking: Reported on 10/18/2015 07/14/14   Dorena Cookey, MD   BP 134/88 mmHg  Pulse 95  Temp(Src) 98.1 F (36.7 C) (Oral)  Resp 13  Ht 6' (1.829 m)  Wt 245 lb (111.131 kg)  BMI 33.22 kg/m2  SpO2 97% Physical Exam  Constitutional: He is oriented to person, place, and time. He appears well-developed and well-nourished. No  distress.  HENT:  Head: Normocephalic and atraumatic.  Eyes: Conjunctivae and EOM are normal. Pupils are equal, round, and reactive to light.  Neck: Normal range of motion. Neck supple. No JVD present.  Cardiovascular: Normal rate, regular rhythm and normal heart sounds.   No murmur heard. Pulmonary/Chest: Effort normal and breath sounds normal. He has no wheezes. He has no rales. He exhibits no tenderness.  Abdominal: Soft. Bowel sounds are normal. He exhibits no distension and no mass. There is no tenderness.  Musculoskeletal: Normal range of motion. He exhibits edema.  2+ pitting edema  Lymphadenopathy:    He has no cervical adenopathy.  Neurological: He is alert and oriented to person, place, and time. No cranial nerve deficit. He exhibits normal muscle tone. Coordination normal.  Skin: Skin is warm and dry. No rash noted.  Psychiatric: He has a normal mood and affect. His behavior is normal. Judgment and thought content normal.  Nursing note and vitals reviewed.   ED Course  Procedures   DIAGNOSTIC STUDIES:  Oxygen Saturation is 99% on RA, normal by my interpretation.    COORDINATION OF CARE:  11:11 PM Will order troponin and CXR.  Discussed treatment plan with pt at bedside and pt agreed to plan.  Labs Review Labs Reviewed  BASIC METABOLIC PANEL - Abnormal; Notable for the following:    CO2 21 (*)    Glucose, Bld 274 (*)    GFR calc non Af Amer 56 (*)    All other components within normal limits  CBC - Abnormal; Notable for the following:    Hemoglobin 12.3 (*)    HCT 37.4 (*)    All other components within normal limits  I-STAT TROPOININ, ED - Abnormal; Notable for the following:    Troponin i, poc 0.21 (*)    All other components within normal limits  DIFFERENTIAL  HEPARIN LEVEL (UNFRACTIONATED)  CBC    Imaging Review Dg Chest Port 1 View  10/18/2015  CLINICAL DATA:  75 year old male with chest pain and shortness of breath EXAM: PORTABLE CHEST 1 VIEW  COMPARISON:  Chest radiograph dated 07/25/2013 FINDINGS: Single-view of the chest demonstrates emphysematous changes of the lungs. No focal consolidation, pleural effusion, or pneumothorax. Stable cardiac silhouette. No acute osseous pathology. IMPRESSION: No active disease. Electronically Signed   By: Anner Crete M.D.   On: 10/18/2015 23:48   I have personally reviewed and evaluated these images and lab results as part of my medical decision-making.   EKG Interpretation   Date/Time:  Wednesday October 18 2015 22:56:27 EST Ventricular Rate:  99 PR Interval:  186 QRS Duration: 94 QT Interval:  327 QTC Calculation: 420 R Axis:   90 Text Interpretation:  Sinus rhythm Borderline right axis deviation Minimal  ST depression, lateral leads ST depression in Inferior leads When compared  with ECG of 07/27/2013, ST depression is now present Confirmed by [facility]   MD, Narvel Kozub ([id number]) on 10/18/2015 11:02:15 PM    CRITICAL CARE Performed by: [id number] Total critical care time: 55 minutes Critical care time was exclusive of separately billable procedures and treating other patients. Critical care was necessary to treat or prevent imminent or life-threatening deterioration. Critical care was time spent personally by me on the following activities: development of treatment plan with patient and/or surrogate as well as nursing, discussions with consultants, evaluation of patient's response to treatment, examination of patient, obtaining history from patient or surrogate, ordering and performing treatments and interventions, ordering and review of laboratory studies, ordering and review of radiographic studies, pulse oximetry and re-evaluation of patient's condition. MDM   Final diagnoses:  NSTEMI (non-ST elevated myocardial infarction) ([place])  Normochromic normocytic anemia    Chest pain of very worrisome for cardiac origin. ECG shows ischemic-looking ST depression. He was given aspirin and  nitroglycerin with partial relief of pain. Troponin is come back elevated at 0.21. He is placed on heparin drip and nitroglycerin drip with complete relief of pain. Case is discussed with Dr. Terrence Dupont, on-call for cardiology, who requested the patient be given Plavix and be admitted to CCU bed.  I personally performed the services described in this documentation, which was scribed in my presence. The recorded information has been reviewed and is accurate.       Delora Fuel, MD [id number]

## 2015-10-18 NOTE — ED Notes (Signed)
Spoke with EMS, patient started having chest pain substernal around 930 tonight. Also experienced short of breath and nausea. Ems gave 1 nitro and 324 of aspirin, pain went down from a 4 to a 2. bp 160/82, p 98, o2 sat 97% on room air, placed on 2L nasal cannula for support, and cbg is 233.20g present in right ac.

## 2015-10-18 NOTE — ED Notes (Signed)
Dr. glick at the bedside. 

## 2015-10-18 NOTE — ED Notes (Signed)
Patient reports tightness in chest,

## 2015-10-19 ENCOUNTER — Encounter (HOSPITAL_COMMUNITY)
Admission: EM | Disposition: A | Discharge status: Home / Self Care | Payer: Self-pay | Source: Home / Self Care | Attending: Cardiology

## 2015-10-19 ENCOUNTER — Encounter (HOSPITAL_COMMUNITY): Payer: Self-pay | Admitting: Cardiology

## 2015-10-19 DIAGNOSIS — N170 Acute kidney failure with tubular necrosis: Secondary | ICD-10-CM | POA: Diagnosis not present

## 2015-10-19 DIAGNOSIS — D500 Iron deficiency anemia secondary to blood loss (chronic): Secondary | ICD-10-CM | POA: Diagnosis not present

## 2015-10-19 DIAGNOSIS — Z86010 Personal history of colonic polyps: Secondary | ICD-10-CM | POA: Diagnosis not present

## 2015-10-19 DIAGNOSIS — N179 Acute kidney failure, unspecified: Secondary | ICD-10-CM | POA: Diagnosis not present

## 2015-10-19 DIAGNOSIS — T508X5A Adverse effect of diagnostic agents, initial encounter: Secondary | ICD-10-CM | POA: Diagnosis not present

## 2015-10-19 DIAGNOSIS — Z833 Family history of diabetes mellitus: Secondary | ICD-10-CM | POA: Diagnosis not present

## 2015-10-19 DIAGNOSIS — Z87891 Personal history of nicotine dependence: Secondary | ICD-10-CM | POA: Diagnosis not present

## 2015-10-19 DIAGNOSIS — Q2733 Arteriovenous malformation of digestive system vessel: Secondary | ICD-10-CM | POA: Diagnosis not present

## 2015-10-19 DIAGNOSIS — I10 Essential (primary) hypertension: Secondary | ICD-10-CM | POA: Diagnosis not present

## 2015-10-19 DIAGNOSIS — R079 Chest pain, unspecified: Secondary | ICD-10-CM | POA: Diagnosis not present

## 2015-10-19 DIAGNOSIS — Z7984 Long term (current) use of oral hypoglycemic drugs: Secondary | ICD-10-CM | POA: Diagnosis not present

## 2015-10-19 DIAGNOSIS — Z79899 Other long term (current) drug therapy: Secondary | ICD-10-CM | POA: Diagnosis not present

## 2015-10-19 DIAGNOSIS — Z6833 Body mass index (BMI) 33.0-33.9, adult: Secondary | ICD-10-CM | POA: Diagnosis not present

## 2015-10-19 DIAGNOSIS — E784 Other hyperlipidemia: Secondary | ICD-10-CM | POA: Diagnosis not present

## 2015-10-19 DIAGNOSIS — I214 Non-ST elevation (NSTEMI) myocardial infarction: Secondary | ICD-10-CM | POA: Diagnosis present

## 2015-10-19 DIAGNOSIS — E119 Type 2 diabetes mellitus without complications: Secondary | ICD-10-CM | POA: Diagnosis not present

## 2015-10-19 DIAGNOSIS — I472 Ventricular tachycardia: Secondary | ICD-10-CM | POA: Diagnosis not present

## 2015-10-19 DIAGNOSIS — I119 Hypertensive heart disease without heart failure: Secondary | ICD-10-CM | POA: Diagnosis not present

## 2015-10-19 DIAGNOSIS — Z9861 Coronary angioplasty status: Secondary | ICD-10-CM | POA: Diagnosis not present

## 2015-10-19 DIAGNOSIS — E785 Hyperlipidemia, unspecified: Secondary | ICD-10-CM | POA: Diagnosis not present

## 2015-10-19 DIAGNOSIS — Z85528 Personal history of other malignant neoplasm of kidney: Secondary | ICD-10-CM | POA: Diagnosis not present

## 2015-10-19 DIAGNOSIS — Z905 Acquired absence of kidney: Secondary | ICD-10-CM | POA: Diagnosis not present

## 2015-10-19 DIAGNOSIS — I480 Paroxysmal atrial fibrillation: Secondary | ICD-10-CM | POA: Diagnosis not present

## 2015-10-19 HISTORY — PX: CARDIAC CATHETERIZATION: SHX172

## 2015-10-19 LAB — LIPID PANEL
CHOL/HDL RATIO: 3.4 ratio
Cholesterol: 175 mg/dL (ref 0–200)
HDL: 52 mg/dL (ref >40)
LDL Cholesterol: 107 mg/dL — ABNORMAL HIGH (ref 0–99)
TRIGLYCERIDES: 79 mg/dL (ref <150)
VLDL: 16 mg/dL (ref 0–40)

## 2015-10-19 LAB — COMPREHENSIVE METABOLIC PANEL WITH GFR
ALT: 65 U/L — ABNORMAL HIGH (ref 17–63)
AST: 318 U/L — ABNORMAL HIGH (ref 15–41)
Albumin: 3.3 g/dL — ABNORMAL LOW (ref 3.5–5.0)
Alkaline Phosphatase: 52 U/L (ref 38–126)
Anion gap: 10 (ref 5–15)
BUN: 14 mg/dL (ref 6–20)
CO2: 26 mmol/L (ref 22–32)
Calcium: 8.5 mg/dL — ABNORMAL LOW (ref 8.9–10.3)
Chloride: 104 mmol/L (ref 101–111)
Creatinine, Ser: 1.16 mg/dL (ref 0.61–1.24)
GFR calc Af Amer: >60 mL/min
GFR calc non Af Amer: >60 mL/min
Glucose, Bld: 169 mg/dL — ABNORMAL HIGH (ref 65–99)
Potassium: 4.2 mmol/L (ref 3.5–5.1)
Sodium: 140 mmol/L (ref 135–145)
Total Bilirubin: 0.6 mg/dL (ref 0.3–1.2)
Total Protein: 6.1 g/dL — ABNORMAL LOW (ref 6.5–8.1)

## 2015-10-19 LAB — CBC WITH DIFFERENTIAL/PLATELET
Basophils Absolute: 0 K/uL (ref 0.0–0.1)
Basophils Relative: 1 %
Eosinophils Absolute: 0.1 K/uL (ref 0.0–0.7)
Eosinophils Relative: 2 %
HCT: 33 % — ABNORMAL LOW (ref 39.0–52.0)
Hemoglobin: 11 g/dL — ABNORMAL LOW (ref 13.0–17.0)
Lymphocytes Relative: 19 %
Lymphs Abs: 1.1 K/uL (ref 0.7–4.0)
MCH: 28.9 pg (ref 26.0–34.0)
MCHC: 33.3 g/dL (ref 30.0–36.0)
MCV: 86.6 fL (ref 78.0–100.0)
Monocytes Absolute: 0.8 K/uL (ref 0.1–1.0)
Monocytes Relative: 15 %
Neutro Abs: 3.5 K/uL (ref 1.7–7.7)
Neutrophils Relative %: 63 %
Platelets: 186 K/uL (ref 150–400)
RBC: 3.81 MIL/uL — ABNORMAL LOW (ref 4.22–5.81)
RDW: 15.3 % (ref 11.5–15.5)
WBC: 5.5 K/uL (ref 4.0–10.5)

## 2015-10-19 LAB — GLUCOSE, CAPILLARY
Glucose-Capillary: 181 mg/dL — ABNORMAL HIGH (ref 65–99)
Glucose-Capillary: 242 mg/dL — ABNORMAL HIGH (ref 65–99)
Glucose-Capillary: 140 mg/dL — ABNORMAL HIGH (ref 65–99)

## 2015-10-19 LAB — MAGNESIUM: Magnesium: 1.8 mg/dL (ref 1.7–2.4)

## 2015-10-19 LAB — TROPONIN I
Troponin I: >65 ng/mL
Troponin I: >65 ng/mL

## 2015-10-19 LAB — PROTIME-INR
INR: 1.12 (ref 0.00–1.49)
Prothrombin Time: 14.6 seconds (ref 11.6–15.2)

## 2015-10-19 LAB — POCT ACTIVATED CLOTTING TIME
ACTIVATED CLOTTING TIME: 399 s
ACTIVATED CLOTTING TIME: 167 s
ACTIVATED CLOTTING TIME: 157 s
ACTIVATED CLOTTING TIME: 142 s

## 2015-10-19 LAB — MRSA PCR SCREENING: MRSA by PCR: NEGATIVE

## 2015-10-19 LAB — PLATELET COUNT: PLATELETS: 205 10*3/uL (ref 150–400)

## 2015-10-19 LAB — HEPARIN LEVEL (UNFRACTIONATED): Heparin Unfractionated: 0.32 IU/mL (ref 0.30–0.70)

## 2015-10-19 SURGERY — LEFT HEART CATH AND CORONARY ANGIOGRAPHY
Anesthesia: LOCAL

## 2015-10-19 MED ORDER — SODIUM CHLORIDE 0.9 % IV SOLN: INTRAVENOUS | Status: DC

## 2015-10-19 MED ORDER — SODIUM CHLORIDE 0.9% FLUSH: 3.0000 mL | INTRAVENOUS | Status: DC | PRN

## 2015-10-19 MED ORDER — ASPIRIN 81 MG PO CHEW
324.0000 mg | CHEWABLE_TABLET | ORAL | Status: AC
  Filled 2015-10-19: qty 4

## 2015-10-19 MED ORDER — MIDAZOLAM HCL 2 MG/2ML IJ SOLN
INTRAMUSCULAR | Status: AC
Start: 2015-10-19 — End: 2015-10-19
  Filled 2015-10-19: qty 2

## 2015-10-19 MED ORDER — INSULIN ASPART 100 UNIT/ML ~~LOC~~ SOLN
0.0000 [IU] | Freq: Three times a day (TID) | SUBCUTANEOUS | Status: DC
  Administered 2015-10-19: 20:00:00 3 [IU] via SUBCUTANEOUS
  Administered 2015-10-20: 18:00:00 1 [IU] via SUBCUTANEOUS
  Administered 2015-10-20 (×2): 2 [IU] via SUBCUTANEOUS
  Administered 2015-10-21 (×2): 1 [IU] via SUBCUTANEOUS
  Administered 2015-10-22 (×2): 2 [IU] via SUBCUTANEOUS

## 2015-10-19 MED ORDER — ATORVASTATIN CALCIUM 40 MG PO TABS
40.0000 mg | ORAL_TABLET | Freq: Every day | ORAL | Status: DC
  Administered 2015-10-19 – 2015-10-21 (×3): 40 mg via ORAL
  Filled 2015-10-19 (×4): qty 1

## 2015-10-19 MED ORDER — NITROGLYCERIN 1 MG/10 ML FOR IR/CATH LAB
INTRA_ARTERIAL | Status: DC | PRN
Start: 2015-10-19 — End: 2015-10-19
  Administered 2015-10-19: 150 ug via INTRACORONARY
  Administered 2015-10-19 (×2): 200 ug via INTRACORONARY
  Administered 2015-10-19: 100 ug via INTRACORONARY

## 2015-10-19 MED ORDER — GLIPIZIDE 5 MG PO TABS
2.5000 mg | ORAL_TABLET | Freq: Two times a day (BID) | ORAL | Status: DC
  Administered 2015-10-19 – 2015-10-22 (×6): 2.5 mg via ORAL
  Filled 2015-10-19 (×9): qty 1

## 2015-10-19 MED ORDER — NOREPINEPHRINE BITARTRATE 1 MG/ML IV SOLN
4.0000 mg | INTRAVENOUS | Status: DC | PRN
  Administered 2015-10-19: 50 ug/kg/min via INTRAVENOUS

## 2015-10-19 MED ORDER — TIROFIBAN HCL IN NACL 5-0.9 MG/100ML-% IV SOLN
INTRAVENOUS | Status: AC
Start: 2015-10-19 — End: 2015-10-19
  Filled 2015-10-19: qty 100

## 2015-10-19 MED ORDER — METOPROLOL TARTRATE 12.5 MG HALF TABLET
12.5000 mg | ORAL_TABLET | Freq: Two times a day (BID) | ORAL | Status: DC
  Administered 2015-10-19 – 2015-10-20 (×2): 12.5 mg via ORAL
  Filled 2015-10-19 (×3): qty 1

## 2015-10-19 MED ORDER — FUROSEMIDE 10 MG/ML IJ SOLN
20.0000 mg | Freq: Once | INTRAMUSCULAR | Status: AC
  Administered 2015-10-19: 20 mg via INTRAVENOUS

## 2015-10-19 MED ORDER — SODIUM CHLORIDE 0.9 % WEIGHT BASED INFUSION
1.0000 mL/kg/h | INTRAVENOUS | Status: DC
  Administered 2015-10-19: 1 mL/h via INTRAVENOUS
  Administered 2015-10-19: 999 mL/h via INTRAVENOUS

## 2015-10-19 MED ORDER — CLOPIDOGREL BISULFATE 300 MG PO TABS
300.0000 mg | ORAL_TABLET | Freq: Once | ORAL | Status: AC
  Administered 2015-10-19: 300 mg via ORAL
  Filled 2015-10-19: qty 1

## 2015-10-19 MED ORDER — TIROFIBAN HCL IN NACL 5-0.9 MG/100ML-% IV SOLN
INTRAVENOUS | Status: DC | PRN
  Administered 2015-10-19: 0.15 ug/kg/min via INTRAVENOUS

## 2015-10-19 MED ORDER — FUROSEMIDE 10 MG/ML IJ SOLN
INTRAMUSCULAR | Status: AC
  Filled 2015-10-19: qty 4

## 2015-10-19 MED ORDER — ASPIRIN 81 MG PO CHEW
81.0000 mg | CHEWABLE_TABLET | Freq: Every day | ORAL | Status: DC
  Administered 2015-10-20 – 2015-10-22 (×3): 81 mg via ORAL
  Filled 2015-10-19 (×3): qty 1

## 2015-10-19 MED ORDER — SODIUM CHLORIDE 0.9 % IV SOLN
INTRAVENOUS | Status: DC
  Administered 2015-10-22: 06:00:00 via INTRAVENOUS

## 2015-10-19 MED ORDER — ASPIRIN 300 MG RE SUPP: 300.0000 mg | RECTAL | Status: AC

## 2015-10-19 MED ORDER — ASPIRIN EC 81 MG PO TBEC: 81.0000 mg | DELAYED_RELEASE_TABLET | Freq: Every day | ORAL | Status: DC

## 2015-10-19 MED ORDER — NOREPINEPHRINE BITARTRATE 1 MG/ML IV SOLN
INTRAVENOUS | Status: AC
  Filled 2015-10-19: qty 4

## 2015-10-19 MED ORDER — HEPARIN (PORCINE) IN NACL 2-0.9 UNIT/ML-% IJ SOLN
INTRAMUSCULAR | Status: DC | PRN
  Administered 2015-10-19: 10:00:00

## 2015-10-19 MED ORDER — ACETAMINOPHEN 325 MG PO TABS
ORAL_TABLET | ORAL | Status: AC
  Filled 2015-10-19: qty 2

## 2015-10-19 MED ORDER — SODIUM CHLORIDE 0.9 % IV SOLN: 250.0000 mL | INTRAVENOUS | Status: DC | PRN

## 2015-10-19 MED ORDER — ONDANSETRON HCL 4 MG/2ML IJ SOLN: 4.0000 mg | Freq: Four times a day (QID) | INTRAMUSCULAR | Status: DC | PRN

## 2015-10-19 MED ORDER — TICAGRELOR 90 MG PO TABS
ORAL_TABLET | ORAL | Status: AC
  Filled 2015-10-19: qty 2

## 2015-10-19 MED ORDER — BIVALIRUDIN 250 MG IV SOLR
INTRAVENOUS | Status: AC
  Filled 2015-10-19: qty 250

## 2015-10-19 MED ORDER — TIROFIBAN HCL IN NACL 5-0.9 MG/100ML-% IV SOLN
0.1500 ug/kg/min | INTRAVENOUS | Status: AC
  Filled 2015-10-19: qty 100

## 2015-10-19 MED ORDER — FENTANYL CITRATE (PF) 100 MCG/2ML IJ SOLN
INTRAMUSCULAR | Status: AC
  Filled 2015-10-19: qty 2

## 2015-10-19 MED ORDER — SODIUM CHLORIDE 0.9 % WEIGHT BASED INFUSION
3.0000 mL/kg/h | INTRAVENOUS | Status: DC
  Administered 2015-10-19: 3 mL/kg/h via INTRAVENOUS

## 2015-10-19 MED ORDER — TICAGRELOR 90 MG PO TABS
ORAL_TABLET | ORAL | Status: DC | PRN
  Administered 2015-10-19: 180 mg via ORAL

## 2015-10-19 MED ORDER — MIDAZOLAM HCL 2 MG/2ML IJ SOLN
INTRAMUSCULAR | Status: DC | PRN
  Administered 2015-10-19: 1 mg via INTRAVENOUS

## 2015-10-19 MED ORDER — ANGIOPLASTY BOOK
Freq: Once | Status: AC
  Administered 2015-10-19: 20:00:00
  Filled 2015-10-19: qty 1

## 2015-10-19 MED ORDER — VERAPAMIL HCL 2.5 MG/ML IV SOLN
INTRAVENOUS | Status: DC | PRN
  Administered 2015-10-19: 600 ug via INTRACORONARY

## 2015-10-19 MED ORDER — SODIUM CHLORIDE 0.9% FLUSH
3.0000 mL | Freq: Two times a day (BID) | INTRAVENOUS | Status: DC
  Administered 2015-10-21 (×2): 3 mL via INTRAVENOUS

## 2015-10-19 MED ORDER — HEPARIN (PORCINE) IN NACL 2-0.9 UNIT/ML-% IJ SOLN
INTRAMUSCULAR | Status: AC
  Filled 2015-10-19: qty 1000

## 2015-10-19 MED ORDER — TICAGRELOR 90 MG PO TABS
90.0000 mg | ORAL_TABLET | Freq: Two times a day (BID) | ORAL | Status: DC
  Administered 2015-10-19 – 2015-10-22 (×6): 90 mg via ORAL
  Filled 2015-10-19 (×7): qty 1

## 2015-10-19 MED ORDER — NITROGLYCERIN 1 MG/10 ML FOR IR/CATH LAB
INTRA_ARTERIAL | Status: AC
  Filled 2015-10-19: qty 10

## 2015-10-19 MED ORDER — VERAPAMIL HCL 2.5 MG/ML IV SOLN
INTRAVENOUS | Status: AC
  Filled 2015-10-19: qty 2

## 2015-10-19 MED ORDER — ACETAMINOPHEN 325 MG PO TABS
650.0000 mg | ORAL_TABLET | ORAL | Status: DC | PRN
  Administered 2015-10-19: 650 mg via ORAL

## 2015-10-19 MED ORDER — NITROGLYCERIN IN D5W 200-5 MCG/ML-% IV SOLN: 10.0000 ug/min | INTRAVENOUS | Status: DC

## 2015-10-19 MED ORDER — BIVALIRUDIN BOLUS VIA INFUSION - CUPID
INTRAVENOUS | Status: DC | PRN
Start: 2015-10-19 — End: 2015-10-19
  Administered 2015-10-19: 84.6 mg via INTRAVENOUS

## 2015-10-19 MED ORDER — TIROFIBAN (AGGRASTAT) BOLUS VIA INFUSION
INTRAVENOUS | Status: DC | PRN
  Administered 2015-10-19: 2820 ug via INTRAVENOUS

## 2015-10-19 MED ORDER — TIROFIBAN HCL IN NACL 5-0.9 MG/100ML-% IV SOLN
INTRAVENOUS | Status: AC
  Filled 2015-10-19: qty 100

## 2015-10-19 MED ORDER — LIDOCAINE HCL (PF) 1 % IJ SOLN
INTRAMUSCULAR | Status: AC
  Filled 2015-10-19: qty 30

## 2015-10-19 MED ORDER — IOHEXOL 350 MG/ML SOLN
INTRAVENOUS | Status: DC | PRN
  Administered 2015-10-19: 225 mL via INTRA_ARTERIAL

## 2015-10-19 MED ORDER — SODIUM CHLORIDE 0.9% FLUSH
3.0000 mL | Freq: Two times a day (BID) | INTRAVENOUS | Status: DC
Start: 2015-10-19 — End: 2015-10-19

## 2015-10-19 MED ORDER — SODIUM CHLORIDE 0.9 % IV SOLN
250.0000 mg | INTRAVENOUS | Status: DC | PRN
  Administered 2015-10-19 (×2): 1.75 mg/kg/h via INTRAVENOUS

## 2015-10-19 MED ORDER — FENTANYL CITRATE (PF) 100 MCG/2ML IJ SOLN
INTRAMUSCULAR | Status: DC | PRN
  Administered 2015-10-19: 25 ug via INTRAVENOUS

## 2015-10-19 MED ORDER — HEART ATTACK BOUNCING BOOK
Freq: Once | Status: AC
  Administered 2015-10-19: 20:00:00
  Filled 2015-10-19: qty 1

## 2015-10-19 SURGICAL SUPPLY — 17 items
BALLN EMERGE MR 2.5X8 (BALLOONS) ×2
BALLN ~~LOC~~ EMERGE MR 3.5X12 (BALLOONS) ×6
BALLOON EMERGE MR 2.5X8 (BALLOONS) ×1 IMPLANT
BALLOON ~~LOC~~ EMERGE MR 3.5X12 (BALLOONS) ×3 IMPLANT
CATH EXTRAC PRONTO 5.5F 138CM (CATHETERS) ×2 IMPLANT
CATH INFINITI 5FR MULTPACK ANG (CATHETERS) ×2 IMPLANT
GUIDE CATH RUNWAY 6FR VL3 (CATHETERS) ×2 IMPLANT
KIT ENCORE 26 ADVANTAGE (KITS) ×2 IMPLANT
KIT HEART LEFT (KITS) ×2 IMPLANT
PACK CARDIAC CATHETERIZATION (CUSTOM PROCEDURE TRAY) ×2 IMPLANT
SHEATH PINNACLE 5F 10CM (SHEATH) ×2 IMPLANT
SHEATH PINNACLE 6F 10CM (SHEATH) ×2 IMPLANT
STENT XIENCE ALPINE RX 3.0X15 (Permanent Stent) ×2 IMPLANT
SYR MEDRAD MARK V 150ML (SYRINGE) ×2 IMPLANT
TRANSDUCER W/STOPCOCK (MISCELLANEOUS) ×2 IMPLANT
WIRE EMERALD 3MM-J .035X150CM (WIRE) ×2 IMPLANT
WIRE RUNTHROUGH .014X180CM (WIRE) ×2 IMPLANT

## 2015-10-19 NOTE — Interval H&P Note (Signed)
Cath Lab Visit (complete for each Cath Lab visit)  Clinical Evaluation Leading to the Procedure:   ACS: Yes.    Non-ACS:    Anginal Classification: CCS IV  Anti-ischemic medical therapy: Maximal Therapy (2 or more classes of medications)  Non-Invasive Test Results: No non-invasive testing performed  Prior CABG: No previous CABG      History and Physical Interval Note:  10/19/2015 8:48 AM  Charles [name]  has presented today for surgery, with the diagnosis of unstable angina  The various methods of treatment have been discussed with the patient and family. After consideration of risks, benefits and other options for treatment, the patient has consented to  Procedure(s): Left Heart Cath and Coronary Angiography (N/A) as a surgical intervention .  The patient's history has been reviewed, patient examined, no change in status, stable for surgery.  I have reviewed the patient's chart and labs.  Questions were answered to the patient's satisfaction.     Charolette Forward

## 2015-10-19 NOTE — Progress Notes (Signed)
CRITICAL VALUE ALERT  Critical value received:  Greater than 65  Date of notification:  10/19/2015  Time of notification:  0808  Critical value read back:Yes.    Nurse who received alert:  Edward Qualia RN   MD notified (1st page):  Dr Terrence Dupont   Time of first page:  0810  MD notified (2nd page):  Time of second page:  Responding MD:  Dr Terrence Dupont  Time MD responded:  [phone]

## 2015-10-19 NOTE — Progress Notes (Addendum)
Dr. Terrence Dupont talking w/patient. Patient has a dry , frequent cough-pointed out to Dr. Terrence Dupont.

## 2015-10-19 NOTE — H&P (Signed)
Charles [name] is an 75 y.o. male.   Chief Complaint: Chest pain HPI: Patient is 75 year old male with past medical history significant for hypertension, diabetes mellitus, hyperlipidemia, morbid obesity, history of CA of the kidney status post left nephrectomy in 2000, history of colonic polyps, came to the ER by EMS complaining of retrosternal chest pain described as pressure tightness grade 4/10 radiating to both arms associated with nausea and mild shortness of breath patient denies any palpitation lightheadedness or syncope denies any history of exertional chest pain. Denies PND orthopnea leg swelling. Patient received one sublingual nitroglycerin and 325 aspirin in route and 2 more sublingual nitroglycerin with relief of chest pain in ED patient was started on IV heparin and nitrates in ED. EKG done in the ED showed normal sinus rhythm with minor ST-T wave changes in inferolateral leads and was noted to have minimally elevated troponin I of 0.21. Patient presently chest pain-free.  Past Medical History  Diagnosis Date  . Hyperlipidemia   . Obesity   . Diabetes mellitus   . Hypertension   . Eczema   . Biceps muscle tear     right  . Hernia, ventral   . Cancer of kidney ([place]) 2000    left nephrectomy  . AVM (arteriovenous malformation) of colon     2 - non-bleeding 2013  . Personal history of adenomatous colonic polyps 07/20/2012    3 + adenomas 2009 07/20/2012 2 diminutive polyps      Past Surgical History  Procedure Laterality Date  . Inguinal hernia repair      left, x 3  . Inguinal hernia repair  2012    right  . Left nephrectomy  2000  . Femur im nail Right 07/26/2013    Procedure: INTRAMEDULLARY (IM) NAIL FEMORAL subtrochanteric;  Surgeon: Mauri Pole, MD;  Location: [facility];  Service: Orthopedics;  Laterality: Right;    Family History  Problem Relation Age of Onset  . Drug abuse Other   . Cancer Other   . Heart disease Other   . Lung disease Other   . Colon cancer  Neg Hx   . Stomach cancer Neg Hx   . Diabetes Mother   . Diabetes Sister    Social History:  reports that he quit smoking about 3 years ago. He has never used smokeless tobacco. He reports that he drinks about 0.6 oz of alcohol per week. He reports that he does not use illicit drugs.  Allergies: No Known Allergies   (Not in a hospital admission)  Results for orders placed or performed during the hospital encounter of 10/18/15 (from the past 48 hour(s))  Basic metabolic panel     Status: Abnormal   Collection Time: 10/18/15 10:53 PM  Result Value Ref Range   Sodium 140 135 - 145 mmol/L   Potassium 4.8 3.5 - 5.1 mmol/L   Chloride 106 101 - 111 mmol/L   CO2 21 (L) 22 - 32 mmol/L   Glucose, Bld 274 (H) 65 - 99 mg/dL   BUN 19 6 - 20 mg/dL   Creatinine, Ser 1.23 0.61 - 1.24 mg/dL   Calcium 8.9 8.9 - 10.3 mg/dL   GFR calc non Af Amer 56 (L) >60 mL/min   GFR calc Af Amer >60 >60 mL/min    Comment: (NOTE) The eGFR has been calculated using the CKD EPI equation. This calculation has not been validated in all clinical situations. eGFR's persistently <60 mL/min signify possible Chronic Kidney Disease.  Anion gap 13 5 - 15  CBC     Status: Abnormal   Collection Time: 10/18/15 10:53 PM  Result Value Ref Range   WBC 4.8 4.0 - 10.5 K/uL    Comment: REPEATED TO VERIFY WHITE COUNT CONFIRMED ON SMEAR    RBC 4.22 4.22 - 5.81 MIL/uL   Hemoglobin 12.3 (L) 13.0 - 17.0 g/dL   HCT 37.4 (L) 39.0 - 52.0 %   MCV 88.6 78.0 - 100.0 fL   MCH 29.1 26.0 - 34.0 pg   MCHC 32.9 30.0 - 36.0 g/dL   RDW 15.5 11.5 - 15.5 %   Platelets 204 150 - 400 K/uL  Differential     Status: None   Collection Time: 10/18/15 10:53 PM  Result Value Ref Range   Neutrophils Relative % 54 %   Lymphocytes Relative 27 %   Monocytes Relative 12 %   Eosinophils Relative 6 %   Basophils Relative 1 %   Neutro Abs 2.6 1.7 - 7.7 K/uL   Lymphs Abs 1.3 0.7 - 4.0 K/uL   Monocytes Absolute 0.6 0.1 - 1.0 K/uL   Eosinophils  Absolute 0.3 0.0 - 0.7 K/uL   Basophils Absolute 0.0 0.0 - 0.1 K/uL   RBC Morphology ELLIPTOCYTES    WBC Morphology TOXIC GRANULATION   I-stat troponin, ED (not at [facility], [facility])     Status: Abnormal   Collection Time: 10/18/15 11:06 PM  Result Value Ref Range   Troponin i, poc 0.21 (HH) 0.00 - 0.08 ng/mL   Comment NOTIFIED PHYSICIAN    Comment 3            Comment: Due to the release kinetics of cTnI, a negative result within the first hours of the onset of symptoms does not rule out myocardial infarction with certainty. If myocardial infarction is still suspected, repeat the test at appropriate intervals.    Dg Chest Port 1 View  10/18/2015  CLINICAL DATA:  75 year old male with chest pain and shortness of breath EXAM: PORTABLE CHEST 1 VIEW COMPARISON:  Chest radiograph dated 07/25/2013 FINDINGS: Single-view of the chest demonstrates emphysematous changes of the lungs. No focal consolidation, pleural effusion, or pneumothorax. Stable cardiac silhouette. No acute osseous pathology. IMPRESSION: No active disease. Electronically Signed   By: Anner Crete M.D.   On: 10/18/2015 23:48    Review of Systems  Constitutional: Negative for fever and chills.  Eyes: Negative for double vision and photophobia.  Respiratory: Positive for shortness of breath.   Cardiovascular: Positive for chest pain. Negative for palpitations and orthopnea.  Gastrointestinal: Positive for nausea. Negative for vomiting and abdominal pain.  Genitourinary: Negative for dysuria.  Neurological: Negative for dizziness and headaches.    Blood pressure 128/85, pulse 92, temperature 98.1 F (36.7 C), temperature source Oral, resp. rate 22, height 6' (1.829 m), weight 111.131 kg (245 lb), SpO2 97 %. Physical Exam  Constitutional: He is oriented to person, place, and time.  HENT:  Head: Normocephalic and atraumatic.  Eyes: Conjunctivae are normal. Left eye exhibits no discharge. No scleral icterus.  Neck: Normal range  of motion. Neck supple. No tracheal deviation present. No thyromegaly present.  Cardiovascular: Normal rate and regular rhythm.   Murmur (Soft systolic murmur and S4 gallop noted) heard. Respiratory: Effort normal and breath sounds normal. No respiratory distress. He has no wheezes. He has no rales.  GI: Soft. Bowel sounds are normal. He exhibits distension. There is no tenderness. There is no rebound.  Musculoskeletal: He exhibits no edema  or tenderness.  Neurological: He is alert and oriented to person, place, and time.     Assessment/Plan Acute non-STEMI Caryl Comes hypertension Diabetes mellitus Hyperlipidemia Morbid obesity History of CA of the left kidney status post nephrectomy in the past History of colonic polyp Remote tobacco abuse Plan As per orders Discussed with patient at length regarding left cardiac cath possible PTCA stenting its risk and benefits i.e. death MI stroke need for emergency CABG local vascular complications etc. and consents for PCI Charolette Forward, MD 10/19/2015, 4:05 AM

## 2015-10-19 NOTE — ED Notes (Signed)
Attempted report 

## 2015-10-19 NOTE — Progress Notes (Addendum)
Dr. Terrence Dupont paged and made aware of audible crackles. Respirations nonlabored. Patient states he does feel a little short of breath. Order taken.

## 2015-10-19 NOTE — Progress Notes (Signed)
Site area: rt groin fa sheath Site Prior to Removal:  Level  0 Pressure Applied For:  25 minutes Manual:   yes Patient Status During Pull:  stable Post Pull Site:  Level  0 Post Pull Instructions Given:  yes Post Pull Pulses Present: rt PT 2+ Dressing Applied:  tegaderm Bedrest begins @  1700 Comments:  No complications. Mickel Baas, RN in to assess site just before dressing.

## 2015-10-19 NOTE — ED Notes (Signed)
Called Dr. Terrence Dupont for bed type change, MD acknowledges, coming to see the patient first.

## 2015-10-19 NOTE — ED Notes (Signed)
Spoke with Dr. Terrence Dupont in regards to bed type. No chest pain at this time, bed type to be changed to stepdown on 2H. Awaiting order.

## 2015-10-20 ENCOUNTER — Other Ambulatory Visit: Payer: Self-pay

## 2015-10-20 LAB — BASIC METABOLIC PANEL
Anion gap: 13 (ref 5–15)
Anion gap: 10 (ref 5–15)
BUN: 15 mg/dL (ref 6–20)
BUN: 16 mg/dL (ref 6–20)
CHLORIDE: 100 mmol/L — AB (ref 101–111)
CHLORIDE: 101 mmol/L (ref 101–111)
CO2: 25 mmol/L (ref 22–32)
CO2: 27 mmol/L (ref 22–32)
CREATININE: 1.35 mg/dL — AB (ref 0.61–1.24)
Calcium: 8.7 mg/dL — ABNORMAL LOW (ref 8.9–10.3)
Calcium: 8.8 mg/dL — ABNORMAL LOW (ref 8.9–10.3)
Creatinine, Ser: 1.26 mg/dL — ABNORMAL HIGH (ref 0.61–1.24)
GFR calc Af Amer: >60 mL/min (ref >60)
GFR calc non Af Amer: 50 mL/min — ABNORMAL LOW (ref >60)
GFR calc non Af Amer: 54 mL/min — ABNORMAL LOW (ref >60)
GFR, EST AFRICAN AMERICAN: 58 mL/min — AB (ref >60)
Glucose, Bld: 252 mg/dL — ABNORMAL HIGH (ref 65–99)
Glucose, Bld: 199 mg/dL — ABNORMAL HIGH (ref 65–99)
POTASSIUM: 4.3 mmol/L (ref 3.5–5.1)
Potassium: 4.6 mmol/L (ref 3.5–5.1)
SODIUM: 138 mmol/L (ref 135–145)
Sodium: 138 mmol/L (ref 135–145)

## 2015-10-20 LAB — CBC
HEMATOCRIT: 37 % — AB (ref 39.0–52.0)
HEMOGLOBIN: 12 g/dL — AB (ref 13.0–17.0)
MCH: 28.6 pg (ref 26.0–34.0)
MCHC: 32.4 g/dL (ref 30.0–36.0)
MCV: 88.1 fL (ref 78.0–100.0)
Platelets: 215 10*3/uL (ref 150–400)
RBC: 4.2 MIL/uL — ABNORMAL LOW (ref 4.22–5.81)
RDW: 15.7 % — ABNORMAL HIGH (ref 11.5–15.5)
WBC: 7.4 10*3/uL (ref 4.0–10.5)

## 2015-10-20 LAB — GLUCOSE, CAPILLARY
GLUCOSE-CAPILLARY: 180 mg/dL — AB (ref 65–99)
Glucose-Capillary: 161 mg/dL — ABNORMAL HIGH (ref 65–99)
Glucose-Capillary: 138 mg/dL — ABNORMAL HIGH (ref 65–99)
Glucose-Capillary: 150 mg/dL — ABNORMAL HIGH (ref 65–99)

## 2015-10-20 LAB — HEMOGLOBIN A1C
HEMOGLOBIN A1C: 6.7 % — AB (ref 4.8–5.6)
Mean Plasma Glucose: 146 mg/dL

## 2015-10-20 LAB — TROPONIN I: TROPONIN I: 51.29 ng/mL — AB (ref <0.031)

## 2015-10-20 MED ORDER — LISINOPRIL 5 MG PO TABS
5.0000 mg | ORAL_TABLET | Freq: Every day | ORAL | Status: DC
  Administered 2015-10-20: 18:00:00 5 mg via ORAL
  Filled 2015-10-20 (×2): qty 1

## 2015-10-20 MED ORDER — METOPROLOL TARTRATE 25 MG PO TABS
25.0000 mg | ORAL_TABLET | Freq: Two times a day (BID) | ORAL | Status: DC
  Administered 2015-10-20: 22:00:00 25 mg via ORAL
  Filled 2015-10-20: qty 1

## 2015-10-20 NOTE — Progress Notes (Signed)
CARDIAC REHAB PHASE I   PRE:  Rate/Rhythm: 110 ST  BP:  Sitting: 126/64        SaO2: 96 RA  MODE:  Ambulation: 1000 ft   POST:  Rate/Rhythm: 132 ST  BP:  Sitting: 127/79         SaO2: 94 RA  Pt ambulated 1000 ft on RA, independent, steady gait, tolerated well.  Pt c/o mild DOE, denies cp, dizziness, declined rest stop. Pt HR elevated, just received metoprolol per RN. Completed MI/stent education. Reviewed risk factors, anti-platelet therapy, stent card, activity restrictions, ntg, exercise, heart healthy diet, carb counting, portion control, and phase 2 cardiac rehab. Pt verbalized understanding, receptive to education, states he exercises daily and has dropped his A1C from >8 to 6.7 with diet and exercise. Pt agrees to phase 2 cardiac rehab referral, will send to [facility]. Pt to recliner after walk, call bell within reach.   [id number] Lenna Sciara, RN, BSN 10/20/2015 9:11 AM

## 2015-10-20 NOTE — Care Management Note (Signed)
Case Management Note  Patient Details  Name: CASPEN PANCIERA MRN: [id number] Date of Birth: May 22, 1941  Subjective/Objective:  Patient indep pta, for dc today, [place] co pay is $45 , [employer] does have in stock. No needs.                   Action/Plan:   Expected Discharge Date:                  Expected Discharge Plan:  Home/Self Care  In-House Referral:     Discharge planning Services  CM Consult  Post Acute Care Choice:    Choice offered to:     DME Arranged:    DME Agency:     HH Arranged:    [place] Agency:     Status of Service:  Completed, signed off  Medicare Important Message Given:    Date Medicare IM Given:    Medicare IM give by:    Date Additional Medicare IM Given:    Additional Medicare Important Message give by:     If discussed at [place] of Stay Meetings, dates discussed:    Additional Comments:  Zenon Mayo, RN 10/20/2015, 11:33 AM

## 2015-10-20 NOTE — Progress Notes (Signed)
Subjective:  Patient denies any chest pain or shortness of breath. Denies any palpitation lightheadedness or syncope states overall feels good noted to have nonsustained VT on the monitor earlier asymptomatic. Cardiac enzymes trending down  Objective:  Vital Signs in the last 24 hours: Temp:  [97.6 F (36.4 C)-99.1 F (37.3 C)] 97.6 F (36.4 C) (02/10 0732) Pulse Rate:  [90-111] 90 (02/10 0452) Resp:  [16-36] 23 (02/10 0452) BP: (100-136)/(44-90) 126/64 mmHg (02/10 0732) SpO2:  [96 %-100 %] 96 % (02/10 0452) Weight:  [115 kg (253 lb 8.5 oz)] 115 kg (253 lb 8.5 oz) (02/10 0452)  Intake/Output from previous day: 02/09 0701 - 02/10 0700 In: 1767.7 [P.O.:440; I.V.:1327.7] Out: 2750 [Urine:2750] Intake/Output from this shift: Total I/O In: 240 [P.O.:240] Out: -   Physical Exam: Neck: no adenopathy, no carotid bruit, no JVD and supple, symmetrical, trachea midline Lungs: clear to auscultation bilaterally Heart: regularly irregular rhythm, S1, S2 normal and Soft systolic murmur noted no S3 gallop Abdomen: soft, non-tender; bowel sounds normal; no masses,  no organomegaly Extremities: extremities normal, atraumatic, no cyanosis or edema and Right groin stable  Lab Results:  Recent Labs  10/19/15 0651 10/19/15 1800 10/20/15 0807  WBC 5.5  --  7.4  HGB 11.0*  --  12.0*  PLT 186 205 215    Recent Labs  10/20/15 0807 10/20/15 1109  NA 138 138  K 4.6 4.3  CL 100* 101  CO2 25 27  GLUCOSE 252* 199*  BUN 15 16  CREATININE 1.35* 1.26*    Recent Labs  10/19/15 1800 10/20/15 1109  TROPONINI >65.00* 51.29*   Hepatic Function Panel  Recent Labs  10/19/15 0651  PROT 6.1*  ALBUMIN 3.3*  AST 318*  ALT 65*  ALKPHOS 52  BILITOT 0.6    Recent Labs  10/19/15 0651  CHOL 175   No results for input(s): PROTIME in the last 72 hours.  Imaging: Imaging results have been reviewed and Dg Chest Port 1 View  10/18/2015  CLINICAL DATA:  75 year old male with chest pain and  shortness of breath EXAM: PORTABLE CHEST 1 VIEW COMPARISON:  Chest radiograph dated 07/25/2013 FINDINGS: Single-view of the chest demonstrates emphysematous changes of the lungs. No focal consolidation, pleural effusion, or pneumothorax. Stable cardiac silhouette. No acute osseous pathology. IMPRESSION: No active disease. Electronically Signed   By: Anner Crete M.D.   On: 10/18/2015 23:48    Cardiac Studies:  Assessment/Plan:  Acute non-STEMI MI status post left cath/PTCA stenting to proximal LAD Status post nonsustained VT asymptomatic Diabetes mellitus Hyperlipidemia Morbid obesity History of CA of the left kidney status post nephrectomy in the past History of colonic polyp Remote tobacco abuse Plan Increase beta blockers as per orders Increase ambulation as tolerated Check labs in a.m. Possible discharge tomorrow if stable Dr. Doylene Canard on-call for weekend.  LOS: 1 day    Charles [name] 10/20/2015, 12:41 PM

## 2015-10-21 ENCOUNTER — Other Ambulatory Visit: Payer: Self-pay

## 2015-10-21 LAB — BASIC METABOLIC PANEL
Anion gap: 9 (ref 5–15)
BUN: 21 mg/dL — AB (ref 6–20)
CALCIUM: 8.4 mg/dL — AB (ref 8.9–10.3)
CHLORIDE: 104 mmol/L (ref 101–111)
CO2: 25 mmol/L (ref 22–32)
CREATININE: 1.4 mg/dL — AB (ref 0.61–1.24)
GFR calc Af Amer: 56 mL/min — ABNORMAL LOW (ref >60)
GFR calc non Af Amer: 48 mL/min — ABNORMAL LOW (ref >60)
Glucose, Bld: 152 mg/dL — ABNORMAL HIGH (ref 65–99)
Potassium: 4.5 mmol/L (ref 3.5–5.1)
Sodium: 138 mmol/L (ref 135–145)

## 2015-10-21 LAB — GLUCOSE, CAPILLARY
GLUCOSE-CAPILLARY: 134 mg/dL — AB (ref 65–99)
GLUCOSE-CAPILLARY: 118 mg/dL — AB (ref 65–99)
GLUCOSE-CAPILLARY: 91 mg/dL (ref 65–99)
Glucose-Capillary: 147 mg/dL — ABNORMAL HIGH (ref 65–99)

## 2015-10-21 LAB — MAGNESIUM: MAGNESIUM: 1.7 mg/dL (ref 1.7–2.4)

## 2015-10-21 LAB — CBC
HEMATOCRIT: 31.9 % — AB (ref 39.0–52.0)
Hemoglobin: 10.2 g/dL — ABNORMAL LOW (ref 13.0–17.0)
MCH: 27.7 pg (ref 26.0–34.0)
MCHC: 32 g/dL (ref 30.0–36.0)
MCV: 86.7 fL (ref 78.0–100.0)
Platelets: 194 10*3/uL (ref 150–400)
RBC: 3.68 MIL/uL — AB (ref 4.22–5.81)
RDW: 15.4 % (ref 11.5–15.5)
WBC: 6 10*3/uL (ref 4.0–10.5)

## 2015-10-21 LAB — TROPONIN I: TROPONIN I: 41.75 ng/mL — AB (ref <0.031)

## 2015-10-21 MED ORDER — SODIUM CHLORIDE 0.9 % IV BOLUS (SEPSIS)
250.0000 mL | Freq: Once | INTRAVENOUS | Status: AC
  Administered 2015-10-21: 04:00:00 250 mL via INTRAVENOUS

## 2015-10-21 MED ORDER — AMIODARONE HCL 200 MG PO TABS
400.0000 mg | ORAL_TABLET | Freq: Once | ORAL | Status: AC
  Administered 2015-10-21: 04:00:00 400 mg via ORAL
  Filled 2015-10-21: qty 2

## 2015-10-21 MED ORDER — MAGNESIUM SULFATE IN D5W 10-5 MG/ML-% IV SOLN
1.0000 g | Freq: Once | INTRAVENOUS | Status: AC
  Administered 2015-10-21: 1 g via INTRAVENOUS
  Filled 2015-10-21 (×2): qty 100

## 2015-10-21 MED ORDER — AMIODARONE HCL 200 MG PO TABS
200.0000 mg | ORAL_TABLET | Freq: Two times a day (BID) | ORAL | Status: DC
  Administered 2015-10-21 – 2015-10-22 (×3): 200 mg via ORAL
  Filled 2015-10-21 (×3): qty 1

## 2015-10-21 NOTE — Progress Notes (Signed)
Report to [facility] on [place].

## 2015-10-21 NOTE — Progress Notes (Signed)
Ref: [name], NP   Subjective:  Feeling little dizzy with low blood pressure. Non-sustained VT last night. Now in sinus rhythm. No abdominal pain.  Objective:  Vital Signs in the last 24 hours: Temp:  [98.4 F (36.9 C)-99.3 F (37.4 C)] 98.4 F (36.9 C) (02/11 0345) Pulse Rate:  [98-105] 98 (02/11 0345) Cardiac Rhythm:  [-] Normal sinus rhythm;Sinus tachycardia (02/11 0800) Resp:  [18-36] 36 (02/11 0915) BP: (86-127)/(52-78) 102/62 mmHg (02/11 0915) SpO2:  [94 %-95 %] 95 % (02/11 0345) Weight:  [114.4 kg (252 lb 3.3 oz)] 114.4 kg (252 lb 3.3 oz) (02/11 0345)  Physical Exam: BP Readings from Last 1 Encounters:  10/21/15 102/62    Wt Readings from Last 1 Encounters:  10/21/15 114.4 kg (252 lb 3.3 oz)    Weight change: -0.6 kg (-1 lb 5.2 oz)  HEENT: [place]/AT, Eyes- PERL, EOMI, Conjunctiva-Pink, Sclera-Non-icteric Neck: No JVD, No bruit, Trachea midline. Lungs:  Clear, Bilateral. Cardiac:  Regular rhythm, normal S1 and S2, no S3. II/VI systolic murmur. Abdomen:  Soft, non-tender. Extremities:  No edema present. No cyanosis. No clubbing. + large superficial ecchymosis in right groin and thigh area. CNS: AxOx3, Cranial nerves grossly intact, moves all 4 extremities. Right handed. Skin: Warm and dry.   Intake/Output from previous day: 02/10 0701 - 02/11 0700 In: 1440 [P.O.:840; I.V.:600] Out: [id number] [Urine:1125]    Lab Results: BMET    Component Value Date/Time   NA 138 10/21/2015 0324   NA 138 10/20/2015 1109   NA 138 10/20/2015 0807   K 4.5 10/21/2015 0324   K 4.3 10/20/2015 1109   K 4.6 10/20/2015 0807   CL 104 10/21/2015 0324   CL 101 10/20/2015 1109   CL 100* 10/20/2015 0807   CO2 25 10/21/2015 0324   CO2 27 10/20/2015 1109   CO2 25 10/20/2015 0807   GLUCOSE 152* 10/21/2015 0324   GLUCOSE 199* 10/20/2015 1109   GLUCOSE 252* 10/20/2015 0807   GLUCOSE 172* 07/25/2006 0854   BUN 21* 10/21/2015 0324   BUN 16 10/20/2015 1109   BUN 15 10/20/2015 0807    CREATININE 1.40* 10/21/2015 0324   CREATININE 1.26* 10/20/2015 1109   CREATININE 1.35* 10/20/2015 0807   CALCIUM 8.4* 10/21/2015 0324   CALCIUM 8.8* 10/20/2015 1109   CALCIUM 8.7* 10/20/2015 0807   GFRNONAA 48* 10/21/2015 0324   GFRNONAA 54* 10/20/2015 1109   GFRNONAA 50* 10/20/2015 0807   GFRAA 56* 10/21/2015 0324   GFRAA >60 10/20/2015 1109   GFRAA 58* 10/20/2015 0807   CBC    Component Value Date/Time   WBC 6.0 10/21/2015 0324   RBC 3.68* 10/21/2015 0324   HGB 10.2* 10/21/2015 0324   HCT 31.9* 10/21/2015 0324   PLT 194 10/21/2015 0324   MCV 86.7 10/21/2015 0324   MCH 27.7 10/21/2015 0324   MCHC 32.0 10/21/2015 0324   RDW 15.4 10/21/2015 0324   LYMPHSABS 1.1 10/19/2015 0651   MONOABS 0.8 10/19/2015 0651   EOSABS 0.1 10/19/2015 0651   BASOSABS 0.0 10/19/2015 0651   HEPATIC Function Panel  Recent Labs  07/21/15 0837 10/19/15 0651  PROT 6.6 6.1*   HEMOGLOBIN A1C No components found for: HGA1C,  MPG CARDIAC ENZYMES Lab Results  Component Value Date   TROPONINI 41.75* 10/21/2015   TROPONINI 51.29* 10/20/2015   TROPONINI >65.00* 10/19/2015   BNP No results for input(s): PROBNP in the last 8760 hours. TSH  Recent Labs  07/21/15 0837  TSH 1.59   CHOLESTEROL  Recent Labs  07/21/15 0837 10/19/15 0651  CHOL 186 175    Scheduled Meds: . amiodarone  200 mg Oral BID  . aspirin  81 mg Oral Daily  . atorvastatin  40 mg Oral q1800  . glipiZIDE  2.5 mg Oral BID AC  . heparin  3,000 Units Intravenous Once  . insulin aspart  0-9 Units Subcutaneous TID WC  . lisinopril  5 mg Oral Daily  . magnesium sulfate 1 - 4 g bolus IVPB  1 g Intravenous Once  . sodium chloride flush  3 mL Intravenous Q12H  . ticagrelor  90 mg Oral BID   Continuous Infusions: . sodium chloride    . sodium chloride 50 mL/hr at 10/20/15 0700  . 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 Transfer to telemetry. Gentle hydration. Increase activity.    LOS: 2 days    Dixie Dials  MD  10/21/2015, 11:31 AM

## 2015-10-21 NOTE — Progress Notes (Signed)
Lisinopril held not discontinued as instructed by Dr. Doylene Canard for this morning. Kristin Bruins RN on [place] notified. Patient transferred to [facility] via wheelchair by staff.

## 2015-10-21 NOTE — Progress Notes (Signed)
CARDIAC REHAB PHASE I   PRE:  Rate/Rhythm: 89 SR  BP:  Sitting: 100/62      SaO2: 96 RA  MODE:  Ambulation: 900 ft   POST:  Rate/Rhythm: 116 ST  BP:  Sitting: 102/62     SaO2: 97 RA [id number] Patient ambulated in hallway independently. Steady gait noted. Denies complaints. Afib during night. Converted shortly after. Remains in sinus this am. Post ambulation patient back to room. Afib booklet reviewed with patient and daughter. D/C education completed yesterday.  Santina Evans, BSN 10/21/2015 9:28 AM

## 2015-10-21 NOTE — Progress Notes (Addendum)
Pt noted in & out A.Fib - NSR/ST with HR ranging 90's-140's. BP=86/50. Asymptomatic.EKG done. Dr. Terrence Dupont informed. NS bolus 250 ml given as ordered. D/C'd Metoprolol & will start amiodarone po as ordered. Will continue to monitor pt.

## 2015-10-21 NOTE — Progress Notes (Addendum)
Admission note:  Arrival Method: arrived on a wheelchair Mental Orientation:alert and oriented x4 Telemetry:  Placed on cardiac monitor ccmd notified  Assessment: see flowsheet Skin: dry and warm IV: clean and dry Pain: denies Tubes: N/A Fall Prevention Safety Plan: low bed call light within reach,  Patient has been oriented to the unit, staff and to the room.will continue to monitor. Oren Beckmann, RN

## 2015-10-22 LAB — FERRITIN: Ferritin: 24 ng/mL (ref 24–336)

## 2015-10-22 LAB — CBC
HEMATOCRIT: 30 % — AB (ref 39.0–52.0)
Hemoglobin: 9.5 g/dL — ABNORMAL LOW (ref 13.0–17.0)
MCH: 27.5 pg (ref 26.0–34.0)
MCHC: 31.7 g/dL (ref 30.0–36.0)
MCV: 87 fL (ref 78.0–100.0)
PLATELETS: 177 10*3/uL (ref 150–400)
RBC: 3.45 MIL/uL — AB (ref 4.22–5.81)
RDW: 15.5 % (ref 11.5–15.5)
WBC: 4.8 10*3/uL (ref 4.0–10.5)

## 2015-10-22 LAB — BASIC METABOLIC PANEL
Anion gap: 10 (ref 5–15)
BUN: 23 mg/dL — AB (ref 6–20)
CHLORIDE: 106 mmol/L (ref 101–111)
CO2: 23 mmol/L (ref 22–32)
Calcium: 8.5 mg/dL — ABNORMAL LOW (ref 8.9–10.3)
Creatinine, Ser: 1.49 mg/dL — ABNORMAL HIGH (ref 0.61–1.24)
GFR calc Af Amer: 52 mL/min — ABNORMAL LOW (ref >60)
GFR, EST NON AFRICAN AMERICAN: 44 mL/min — AB (ref >60)
GLUCOSE: 150 mg/dL — AB (ref 65–99)
POTASSIUM: 4.5 mmol/L (ref 3.5–5.1)
Sodium: 139 mmol/L (ref 135–145)

## 2015-10-22 LAB — GLUCOSE, CAPILLARY
Glucose-Capillary: 164 mg/dL — ABNORMAL HIGH (ref 65–99)
Glucose-Capillary: 162 mg/dL — ABNORMAL HIGH (ref 65–99)

## 2015-10-22 LAB — IRON AND TIBC
IRON: 37 ug/dL — AB (ref 45–182)
SATURATION RATIOS: 9 % — AB (ref 17.9–39.5)
TIBC: 398 ug/dL (ref 250–450)
UIBC: 361 ug/dL

## 2015-10-22 LAB — MAGNESIUM: Magnesium: 2.1 mg/dL (ref 1.7–2.4)

## 2015-10-22 MED ORDER — TICAGRELOR 90 MG PO TABS: 90.0000 mg | ORAL_TABLET | Freq: Two times a day (BID) | ORAL | Status: DC

## 2015-10-22 MED ORDER — AMIODARONE HCL 200 MG PO TABS: 200.0000 mg | ORAL_TABLET | Freq: Every day | ORAL | Status: DC

## 2015-10-22 MED ORDER — NITROGLYCERIN 0.4 MG SL SUBL: 0.4000 mg | SUBLINGUAL_TABLET | SUBLINGUAL | Status: DC | PRN

## 2015-10-22 MED ORDER — ASPIRIN 81 MG PO CHEW: 81.0000 mg | CHEWABLE_TABLET | Freq: Every day | ORAL | Status: DC

## 2015-10-22 MED ORDER — LISINOPRIL 5 MG PO TABS: 5.0000 mg | ORAL_TABLET | Freq: Every day | ORAL | Status: DC

## 2015-10-22 MED ORDER — ATORVASTATIN CALCIUM 40 MG PO TABS: 40.0000 mg | ORAL_TABLET | Freq: Every day | ORAL | Status: DC

## 2015-10-22 NOTE — Discharge Summary (Signed)
Physician Discharge Summary  Patient ID: Charles [name] MRN: [id number] DOB/AGE: [birth year] 75 y.o.  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 Acute renal failure-contrast induced  Discharged Condition: fair  Hospital Course: 75 year old male with past medical history significant for hypertension, diabetes mellitus, hyperlipidemia, morbid obesity, history of CA of the kidney status post left nephrectomy in 2000, history of colonic polyps, came to the ER by EMS complaining of retrosternal chest pain described as pressure tightness grade 4/10 radiating to both arms associated with nausea and mild shortness of breath. He underwent cardiac cath followed by proximal LAD stent placement. Next day he was dizzy with low blood pressure and non-sustained VT. He improved with gentle hydration and holding Beta-blocker and lisinopril for 2 days. He was discharged home in stabler condition with follow up by Dr. Terrence Dupont in 2 weeks.   Consults: cardiology  Significant Diagnostic Studies: labs: Hgb 12.0 on adm. to 9.5. Normal WBC and platelets count. Near normal BMET with mildly elevated Creatinine and blood sugar of 150 to 252 mg. Troponin-I of more than 65 ng on admission to 41.75 ng. on day of discharge.   EKG-SR, NSTEMI with inferior  wall reciprocal ST changes of ischemia.  Chest x-ray: Unremarkable.  Left heart catheterization:  Mid RCA lesion, 20% stenosed. Dist RCA lesion, 20% stenosed. Dist LAD lesion, 40% stenosed. 1st Mrg lesion, 70% stenosed. Prox Cx lesion, 60% stenosed. Ost LAD to Prox LAD lesion, 95% stenosed.3.015 mm long drug-eluting stent was deployed. Post intervention, there was a 10% residual stenosis.     There is mild to moderate left ventricular systolic dysfunction..  Treatments: Cardiac medications: Aspirin, Brilinta, atorvastatin, Lisinopril.  Discharge Exam: Blood pressure 108/63, pulse 87, temperature 97.8 F (36.6 C), temperature source Oral, resp. rate 18, height 6' (1.829 m), weight 112.447 kg (247 lb 14.4 oz), SpO2 99 %. HEENT: [place]/AT, Eyes- PERL, EOMI, Conjunctiva-Pink, Sclera-Non-icteric Neck: No JVD, No bruit, Trachea midline. Lungs: Clear, Bilateral. Cardiac: Regular rhythm, normal S1 and S2, no S3. II/VI systolic murmur. Abdomen: Soft, non-tender. Extremities: No edema present. No cyanosis. No clubbing. + large superficial ecchymosis in right groin and thigh area. CNS: AxOx3, Cranial nerves grossly intact, moves all 4 extremities. Right handed. Skin: Warm and dry  Disposition: 03-Skilled Nursing Facility  Discharge Instructions    AMB Referral to Cardiac Rehabilitation - Phase II    Complete by:  As directed   Diagnosis:  Myocardial Infarction            Medication List    TAKE these medications        amiodarone 200 MG tablet  Commonly known as:  PACERONE  Take 1 tablet (200 mg total) by mouth daily.     aspirin 81 MG chewable tablet  Chew 1 tablet (81 mg total) by mouth daily.     atorvastatin 40 MG tablet  Commonly known as:  LIPITOR  Take 1 tablet (40 mg total)  by mouth daily at 6 PM.     EPIPEN 2-PAK 0.3 mg/0.3 mL Devi  Generic drug:  EPINEPHrine  Inject 0.3 mg into the muscle once.     glipiZIDE 10 MG tablet  Commonly known as:  GLUCOTROL  Take  one-half tablet by  mouth two times daily  before meals     glucose blood test strip  Commonly known as:  ONETOUCH VERIO  Test once daily.  Dx E11.9     lisinopril 5 MG tablet  Commonly known as:  PRINIVIL,ZESTRIL  Take 1 tablet (5 mg total) by mouth daily.     metFORMIN 1000 MG tablet  Commonly known as:  GLUCOPHAGE  Take 1 tablet (1,000 mg total) by mouth 2 (two) times daily with a meal.     nitroGLYCERIN 0.4 MG SL tablet  Commonly known as:  NITROSTAT  Place 1 tablet (0.4 mg total) under the tongue every 5 (five) minutes as needed for chest pain.     ticagrelor 90 MG Tabs tablet  Commonly known as:  BRILINTA  Take 1 tablet (90 mg total) by mouth 2 (two) times daily.     VITAMIN B-12 IJ  Inject 1 mL as directed every 30 (thirty) days.           Follow-up Information    Follow up with [name], NP. Schedule an appointment as soon as possible for a visit in 1 week.   Specialty:  Family Medicine   Contact information:   [place] [address] [phone]       Follow up with Charolette Forward, MD. Schedule an appointment as soon as possible for a visit in 2 weeks.   Specialty:  Cardiology   Contact information:   [place] [address] [place] [address] [phone]       Signed: Birdie Riddle 10/22/2015, 10:17 AM

## 2015-10-22 NOTE — Progress Notes (Addendum)
Pt in stable condition, went over discharge instructions with pt and daughter, pt verbalizes understanding pt belongings at bedside, IV removed, dc cardiac monitor ,ccmd notified.Pt taken off the unit by  NT on a wheelchair.  Oren Beckmann, RN

## 2015-10-24 ENCOUNTER — Telehealth: Payer: Self-pay

## 2015-10-24 NOTE — Telephone Encounter (Signed)
Transition Care Management Follow-up Telephone Call  How have you been since you were released from the hospital? Im doing much better    Do you understand why you were in the hospital? yes   Do you understand the discharge instrcutions? yes  Items Reviewed:  Medications reviewed: yes  Allergies reviewed: yes  Dietary changes reviewed: yes  Referrals reviewed: yes   Functional Questionnaire:   Activities of Daily Living (ADLs):   He states they are independent in the following: ambulation, bathing and hygiene, feeding, continence, grooming, toileting and dressing States they require assistance with the following: None    Any transportation issues/concerns?: no   Any patient concerns? no   Confirmed importance and date/time of follow-up visits scheduled: yes   Confirmed with patient if condition begins to worsen call PCP or go to the ER.  Patient was given the Call-a-Nurse line [phone]: yes  Pt has an appt with Tommi Rumps on Thursday 10/26/15 at 2:30

## 2015-10-26 ENCOUNTER — Encounter: Payer: Self-pay | Admitting: Adult Health

## 2015-10-26 ENCOUNTER — Ambulatory Visit (INDEPENDENT_AMBULATORY_CARE_PROVIDER_SITE_OTHER): Payer: Medicare Other | Admitting: Adult Health

## 2015-10-26 VITALS — BP 110/82 | Temp 97.7°F | Ht 72.0 in | Wt 238.1 lb

## 2015-10-26 DIAGNOSIS — I214 Non-ST elevation (NSTEMI) myocardial infarction: Secondary | ICD-10-CM

## 2015-10-26 DIAGNOSIS — Z09 Encounter for follow-up examination after completed treatment for conditions other than malignant neoplasm: Secondary | ICD-10-CM | POA: Diagnosis not present

## 2015-10-26 NOTE — Patient Instructions (Signed)
It was great seeing you again today!  I will follow up with you regarding your blood work.   No strenuous activity until being seen by cardiology. If you have chest pain, go to the ER.   Please let me know if you need anything.

## 2015-10-26 NOTE — Progress Notes (Signed)
Pre visit review using our clinic review tool, if applicable. No additional management support is needed unless otherwise documented below in the visit note. 

## 2015-10-26 NOTE — Progress Notes (Signed)
Subjective:    Patient ID: Charles [name], male    DOB: [birth year], 75 y.o.   MRN: [id number]  HPI   75 year old male, who presents to the office today for hospital discharge follow up. He was admitted on 10/18/2015 and discharged on 10/22/2015. He has a significant PMH of hypertension, diabetes mellitus, hyperlipidemia, morbid obesity, history of CA of the kidney status post left nephrectomy in 2000, history of colonic polyps.   Per discharge note, Charles [name] came to the ER by EMS complaining of retrosternal chest pain described as pressure tightness grade 4/10 radiating to both arms associated with nausea and mild shortness of breath. He underwent cardiac cath followed by proximal LAD stent placement. Next day he was dizzy with low blood pressure and non-sustained VT. He improved with gentle hydration and holding Beta-blocker and lisinopril for 2 days  EKG-SR, NSTEMI with inferior wall reciprocal ST changes of ischemia.  Chest x-ray: Unremarkable.  Left heart catheterization:  Mid RCA lesion, 20% stenosed. Dist RCA lesion, 20% stenosed. Dist LAD lesion, 40% stenosed. 1st Mrg lesion, 70% stenosed. Prox Cx lesion, 60% stenosed. Ost LAD to Prox LAD lesion, 95% stenosed.3.015 mm long drug-eluting stent was deployed. Post intervention, there was a 10% residual stenosis.  There is mild to moderate left ventricular systolic dysfunction.Marland Kitchen  He was started on ASA, Brilinta, Atorvastatin and Lisinopril.   Since being discharged four days ago he reports that he is feeling fairly well. He does have an occasional episode of shortness of breath. He does not have any chest pain, dizziness, or lightheadedness.   He will see Cardiology in 7 days and after that participate in cardiac rehab.   He has been [facility]-3 [facility] the [facility].   Wt Readings from Last 3 Encounters:  10/26/15 238 lb 1.6 oz (108.001 kg)  10/21/15 247 lb 14.4 oz (112.447 kg)  10/13/15 247 lb (112.038 kg)     Review of Systems    Constitutional: Positive for activity change.  HENT: Negative.   Respiratory: Positive for shortness of breath. Negative for cough, chest tightness and wheezing.   Cardiovascular: Negative.   Gastrointestinal: Negative.   Genitourinary: Negative.   Musculoskeletal: Negative.   Skin: Negative.   Neurological: Negative.   Hematological: Negative.   Psychiatric/Behavioral: Negative.   All other systems reviewed and are negative.  Past Medical History  Diagnosis Date  . Hyperlipidemia   . Obesity   . Hypertension   . Eczema   . Biceps muscle tear     right  . Hernia, ventral   . AVM (arteriovenous malformation) of colon     2 - non-bleeding 2013  . Personal history of adenomatous colonic polyps 07/20/2012    3 + adenomas 2009 07/20/2012 2 diminutive polyps    . Coronary artery disease   . Pneumonia 1942; 1950s X 1  . Walking pneumonia 2000's X 1  . Type II diabetes mellitus ([place])   . Anemia     "when I was a lad"  . GERD (gastroesophageal reflux disease)   . Arthritis     "right femur" (10/19/2015)  . Cancer of kidney ([place]) 2000    left nephrectomy    Social History   Social History  . Marital Status: Widowed    Spouse Name: N/A  . Number of Children: N/A  . Years of Education: N/A   Occupational History  . Not on file.   Social History Main Topics  . Smoking status: Former Smoker --  20 years    Types: Cigarettes, Pipe, Cigars    Quit date: 07/06/2012  . Smokeless tobacco: Never Used  . Alcohol Use: 0.6 oz/week    1 Cans of beer per week  . Drug Use: No  . Sexual Activity: No   Other Topics Concern  . 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    Not married- widowed    Three children, One in [place], one in [place] one in [place]    Past Surgical History  Procedure Laterality Date  . Inguinal hernia repair Left >3 times  .  Nephrectomy Left 2000  . Femur im nail Right 07/26/2013    Procedure: INTRAMEDULLARY (IM) NAIL FEMORAL subtrochanteric;  Surgeon: Mauri Pole, MD;  Location: [facility];  Service: Orthopedics;  Laterality: Right;  . Cardiac catheterization N/A 10/19/2015    Procedure: Left Heart Cath and Coronary Angiography;  Surgeon: Charolette Forward, MD;  Location: [facility];  Service: Cardiovascular;  Laterality: N/A;  . Cardiac catheterization N/A 10/19/2015    Procedure: Coronary Stent Intervention;  Surgeon: Charolette Forward, MD;  Location: [facility];  Service: Cardiovascular;  Laterality: N/A;  . Inguinal hernia repair Right 2012  . Tonsillectomy  1940s  . Fracture surgery    . Coronary angioplasty    . Colonoscopy w/ biopsies and polypectomy  X 1  . Colonoscopy      "nothing showed up this time"    Family History  Problem Relation Age of Onset  . Drug abuse Other   . Cancer Other   . Heart disease Other   . Lung disease Other   . Colon cancer Neg Hx   . Stomach cancer Neg Hx   . Diabetes Mother   . Diabetes Sister     No Known Allergies  Current Outpatient Prescriptions on File Prior to Visit  Medication Sig Dispense Refill  . amiodarone (PACERONE) 200 MG tablet Take 1 tablet (200 mg total) by mouth daily. 30 tablet 1  . aspirin 81 MG chewable tablet Chew 1 tablet (81 mg total) by mouth daily.    Marland Kitchen atorvastatin (LIPITOR) 40 MG tablet Take 1 tablet (40 mg total) by mouth daily at 6 PM. 30 tablet 3  . Cyanocobalamin (VITAMIN B-12 IJ) Inject 1 mL as directed every 30 (thirty) days.    Marland Kitchen EPINEPHrine (EPIPEN 2-PAK) 0.3 mg/0.3 mL DEVI Inject 0.3 mg into the muscle once.      Marland Kitchen glipiZIDE (GLUCOTROL) 10 MG tablet Take one-half tablet by  mouth two times daily  before meals 90 tablet 1  . glucose blood (ONETOUCH VERIO) test strip Test once daily.  Dx E11.9 100 each 12  . lisinopril (PRINIVIL,ZESTRIL) 5 MG tablet Take 1 tablet (5 mg total) by mouth daily. 30 tablet 3  . metFORMIN  (GLUCOPHAGE) 1000 MG tablet Take 1 tablet (1,000 mg total) by mouth 2 (two) times daily with a meal. 180 tablet 1  . nitroGLYCERIN (NITROSTAT) 0.4 MG SL tablet Place 1 tablet (0.4 mg total) under the tongue every 5 (five) minutes as needed for chest pain. 25 tablet 1  . ticagrelor (BRILINTA) 90 MG TABS tablet Take 1 tablet (90 mg total) by mouth 2 (two) times daily. 60 tablet 3   No current facility-administered medications on file prior to visit.    BP 90/58 mmHg  Temp(Src)  97.7 F (36.5 C) (Oral)  Ht 6' (1.829 m)  Wt 238 lb 1.6 oz (108.001 kg)  BMI 32.28 kg/m2       Objective:   Physical Exam  Constitutional: He is oriented to person, place, and time. He appears well-developed and well-nourished. No distress.  Eyes: Conjunctivae and EOM are normal. Pupils are equal, round, and reactive to light. Right eye exhibits no discharge. Left eye exhibits no discharge. No scleral icterus.  Neck: Normal range of motion. Neck supple.  Cardiovascular: Normal rate, regular rhythm, normal heart sounds and intact distal pulses.  Exam reveals no friction rub.   No murmur heard. Pulmonary/Chest: Effort normal and breath sounds normal. No respiratory distress. He has no wheezes. He has no rales. He exhibits no tenderness.  Lymphadenopathy:    He has no cervical adenopathy.  Neurological: He is alert and oriented to person, place, and time.  Skin: Skin is warm and dry. No rash noted. He is not diaphoretic. No erythema.  Psychiatric: He has a normal mood and affect. His behavior is normal. Judgment and thought content normal.  Nursing note and vitals reviewed.     Assessment & Plan:  1. Hospital discharge follow-up - Appears to be doing well s/p NSTEMI and heart cath.  - CBC with Differential/Platelet - Basic metabolic panel - Unable to get EKG due to EKG machines being down  - Follow up with Cardiology  - Advanced activity as instructed by cardiology.  - Follow up as needed

## 2015-10-27 NOTE — Addendum Note (Signed)
Addended by: Apolinar Junes on: 10/27/2015 11:47 AM   Modules accepted: Level of Service

## 2015-10-30 DIAGNOSIS — H353131 Nonexudative age-related macular degeneration, bilateral, early dry stage: Secondary | ICD-10-CM | POA: Diagnosis not present

## 2015-10-30 DIAGNOSIS — E119 Type 2 diabetes mellitus without complications: Secondary | ICD-10-CM | POA: Diagnosis not present

## 2015-10-30 DIAGNOSIS — H2513 Age-related nuclear cataract, bilateral: Secondary | ICD-10-CM | POA: Diagnosis not present

## 2015-10-30 LAB — HM DIABETES EYE EXAM

## 2015-11-02 DIAGNOSIS — I480 Paroxysmal atrial fibrillation: Secondary | ICD-10-CM | POA: Diagnosis not present

## 2015-11-02 DIAGNOSIS — I214 Non-ST elevation (NSTEMI) myocardial infarction: Secondary | ICD-10-CM | POA: Diagnosis not present

## 2015-11-02 DIAGNOSIS — N189 Chronic kidney disease, unspecified: Secondary | ICD-10-CM | POA: Diagnosis not present

## 2015-11-02 DIAGNOSIS — I129 Hypertensive chronic kidney disease with stage 1 through stage 4 chronic kidney disease, or unspecified chronic kidney disease: Secondary | ICD-10-CM | POA: Diagnosis not present

## 2015-11-02 DIAGNOSIS — R0789 Other chest pain: Secondary | ICD-10-CM | POA: Diagnosis not present

## 2015-11-13 ENCOUNTER — Other Ambulatory Visit: Payer: Self-pay | Admitting: Family Medicine

## 2015-11-14 ENCOUNTER — Ambulatory Visit (INDEPENDENT_AMBULATORY_CARE_PROVIDER_SITE_OTHER): Payer: Medicare Other | Admitting: *Deleted

## 2015-11-14 ENCOUNTER — Other Ambulatory Visit (INDEPENDENT_AMBULATORY_CARE_PROVIDER_SITE_OTHER): Payer: Medicare Other | Admitting: *Deleted

## 2015-11-14 DIAGNOSIS — E538 Deficiency of other specified B group vitamins: Secondary | ICD-10-CM | POA: Diagnosis not present

## 2015-11-14 MED ORDER — CYANOCOBALAMIN 1000 MCG/ML IJ SOLN
1000.0000 ug | Freq: Once | INTRAMUSCULAR | Status: AC
  Administered 2015-11-14: 1000 ug via INTRAMUSCULAR

## 2015-11-21 ENCOUNTER — Encounter (HOSPITAL_COMMUNITY): Payer: Self-pay

## 2015-11-21 ENCOUNTER — Encounter (HOSPITAL_COMMUNITY)
Admission: RE | Admit: 2015-11-21 | Discharge: 2015-11-21 | Disposition: A | Discharge status: Home / Self Care | Payer: Medicare Other | Source: Ambulatory Visit | Attending: Cardiology | Admitting: Cardiology

## 2015-11-21 VITALS — BP 118/68 | HR 79 | Ht 71.0 in | Wt 237.9 lb

## 2015-11-21 DIAGNOSIS — E785 Hyperlipidemia, unspecified: Secondary | ICD-10-CM | POA: Diagnosis not present

## 2015-11-21 DIAGNOSIS — I10 Essential (primary) hypertension: Secondary | ICD-10-CM | POA: Diagnosis not present

## 2015-11-21 DIAGNOSIS — Z955 Presence of coronary angioplasty implant and graft: Secondary | ICD-10-CM | POA: Insufficient documentation

## 2015-11-21 DIAGNOSIS — I214 Non-ST elevation (NSTEMI) myocardial infarction: Secondary | ICD-10-CM | POA: Diagnosis not present

## 2015-11-21 DIAGNOSIS — I480 Paroxysmal atrial fibrillation: Secondary | ICD-10-CM | POA: Diagnosis not present

## 2015-11-21 DIAGNOSIS — E119 Type 2 diabetes mellitus without complications: Secondary | ICD-10-CM | POA: Diagnosis not present

## 2015-11-21 DIAGNOSIS — I2510 Atherosclerotic heart disease of native coronary artery without angina pectoris: Secondary | ICD-10-CM | POA: Diagnosis not present

## 2015-11-21 NOTE — Progress Notes (Signed)
Cardiac Rehab Medication Review by a Pharmacist  Does the patient  feel that his/her medications are working for him/her?  yes  Has the patient been experiencing any side effects to the medications prescribed?  no  Does the patient measure his/her own blood pressure or blood glucose at home?  yes   Does the patient have any problems obtaining medications due to transportation or finances?   no  Understanding of regimen: excellent Understanding of indications: excellent Potential of compliance: excellent    Pharmacist comments: Patient know his medication fairly well. He reports not missing anymedications and that he is feeling good!  Darl Pikes, PharmD Clinical Pharmacist- Resident Pager: [phone]  Darl Pikes 11/21/2015 1:54 PM

## 2015-11-22 NOTE — Progress Notes (Signed)
Cardiac Individual Treatment Plan  Patient Details  Name: Charles [name] MRN: [id number] Date of Birth: 1941/06/13 Referring Provider:  Dr. Charolette Forward  Initial Encounter Date:       CARDIAC REHAB PHASE II ORIENTATION from 11/21/2015 in [place]   Date  11/21/15      Visit Diagnosis: NSTEMI (non-ST elevated myocardial infarction) [facility])  Stented coronary artery  Patient's Home Medications on Admission:  Current outpatient prescriptions:  .  amiodarone (PACERONE) 200 MG tablet, Take 1 tablet (200 mg total) by mouth daily., Disp: 30 tablet, Rfl: 1 .  aspirin 81 MG chewable tablet, Chew 1 tablet (81 mg total) by mouth daily., Disp: , Rfl:  .  atorvastatin (LIPITOR) 40 MG tablet, Take 1 tablet (40 mg total) by mouth daily at 6 PM., Disp: 30 tablet, Rfl: 3 .  Cyanocobalamin (VITAMIN B-12 IJ), Inject 1 mL as directed every 30 (thirty) days., Disp: , Rfl:  .  EPINEPHrine (EPIPEN 2-PAK) 0.3 mg/0.3 mL DEVI, Inject 0.3 mg into the muscle once.  , Disp: , Rfl:  .  glipiZIDE (GLUCOTROL) 10 MG tablet, Take one-half tablet by  mouth two times daily  before meals, Disp: 90 tablet, Rfl: 1 .  lisinopril (PRINIVIL,ZESTRIL) 5 MG tablet, Take 1 tablet (5 mg total) by mouth daily., Disp: 30 tablet, Rfl: 3 .  metFORMIN (GLUCOPHAGE) 1000 MG tablet, Take 1 tablet (1,000 mg total) by mouth 2 (two) times daily with a meal., Disp: 180 tablet, Rfl: 1 .  Multiple Vitamins-Minerals (ICAPS PO), Take 1 tablet by mouth daily., Disp: , Rfl:  .  nitroGLYCERIN (NITROSTAT) 0.4 MG SL tablet, Place 1 tablet (0.4 mg total) under the tongue every 5 (five) minutes as needed for chest pain., Disp: 25 tablet, Rfl: 1 .  ONETOUCH VERIO test strip, USE ONE STRIP TO CHECK GLUCOSE ONCE DAILY, Disp: 100 each, Rfl: 3 .  ticagrelor (BRILINTA) 90 MG TABS tablet, Take 1 tablet (90 mg total) by mouth 2 (two) times daily., Disp: 60 tablet, Rfl: 3  Past Medical History: Past Medical History  Diagnosis Date   . Hyperlipidemia   . Obesity   . Hypertension   . Eczema   . Biceps muscle tear     right  . Hernia, ventral   . AVM (arteriovenous malformation) of colon     2 - non-bleeding 2013  . Personal history of adenomatous colonic polyps 07/20/2012    3 + adenomas 2009 07/20/2012 2 diminutive polyps    . Coronary artery disease   . Pneumonia 1942; 1950s X 1  . Walking pneumonia 2000's X 1  . Type II diabetes mellitus ([place])   . Anemia     "when I was a lad"  . GERD (gastroesophageal reflux disease)   . Arthritis     "right femur" (10/19/2015)  . 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 Former Smoker -- 20 years  . Types: Cigarettes, Pipe, Cigars  . Quit date: 07/06/2012  Smokeless tobacco  . Never Used    Labs:     Recent Review Flowsheet Data    Labs for ITP Cardiac and Pulmonary Rehab Latest Ref Rng 04/14/2015 07/14/2015 07/21/2015 10/13/2015 10/19/2015   Cholestrol 0 - 200 mg/dL - - 186 - 175   LDLCALC 0 - 99 mg/dL - - 106(H) - 107(H)   HDL >40 mg/dL - - 44.10 - 52   Trlycerides <150  mg/dL - - 182.0(H) - 79   Hemoglobin A1c 4.8 - 5.6 % 5.9 6.0 6.4 6.3 6.7(H)      Capillary Blood Glucose: Lab Results  Component Value Date   GLUCAP 162* 10/22/2015   GLUCAP 164* 10/22/2015   GLUCAP 91 10/21/2015   GLUCAP 147* 10/21/2015   GLUCAP 118* 10/21/2015     Exercise Target Goals: Date: 11/21/15  Exercise Program Goal: Individual exercise prescription set with THRR, safety & activity barriers. Participant demonstrates ability to understand and report RPE using BORG scale, to self-measure pulse accurately, and to acknowledge the importance of the exercise prescription.  Exercise Prescription Goal: Starting with aerobic activity 30 plus minutes a day, 3 days per week for initial exercise prescription. Provide home exercise prescription and guidelines that participant acknowledges understanding prior to discharge.  Activity  Barriers & Risk Stratification:     Activity Barriers & Cardiac Risk Stratification - 11/21/15 1425    Activity Barriers & Cardiac Risk Stratification   Activity Barriers Other (comment);Deconditioning;Arthritis   Comments broke femur in 07/2013      6 Minute Walk:     6 Minute Walk      11/21/15 1600       6 Minute Walk   Phase Initial     Distance 2016 feet     Walk Time 6 minutes     # of Rest Breaks 0     MPH 3.82     METS 3.86     RPE 13     VO2 Peak 13.5     Symptoms No     Resting HR 79 bpm     Resting BP 118/68 mmHg     Max Ex. HR 117 bpm     Max Ex. BP 138/74 mmHg        Initial Exercise Prescription:     Initial Exercise Prescription - 11/21/15 1600    Date of Initial Exercise Prescription   Date 11/21/15   Bike   Level 1.7   Minutes 10   METs 3.8   NuStep   Level 3   Minutes 10   METs 2   Track   Laps 12   Minutes 10   METs 3.07   Prescription Details   Frequency (times per week) 3   Duration Progress to 30 minutes of continuous aerobic without signs/symptoms of physical distress   Intensity   THRR 40-80% of Max Heartrate 69-138   Ratings of Perceived Exertion 11-13   Progression   Progression Continue to progress workloads to maintain intensity without signs/symptoms of physical distress.   Resistance Training   Training Prescription Yes   Weight 2 lbs   Reps 10-12      Perform Capillary Blood Glucose checks as needed.  Exercise Prescription Changes:   Discharge Exercise Prescription (Final Exercise Prescription Changes):   Nutrition:  Target Goals: Understanding of nutrition guidelines, daily intake of sodium 1500mg , cholesterol 200mg , calories 30% from fat and 7% or less from saturated fats, daily to have 5 or more servings of fruits and vegetables.  Biometrics:     Pre Biometrics - 11/21/15 1607    Pre Biometrics   Height 5\' 11"  (1.803 m)   Weight 237 lb 14 oz (107.9 kg)   Waist Circumference 44 inches   Hip  Circumference 41 inches   Waist to Hip Ratio 1.07 %   BMI (Calculated) 33.2   Triceps Skinfold 15 mm   % Body Fat 31.3 %   Grip  Strength 43 kg   Flexibility 12 in   Single Leg Stand 3.65 seconds       Nutrition Therapy Plan and Nutrition Goals:   Nutrition Discharge: Rate Your Plate Scores:   Nutrition Goals Re-Evaluation:   Psychosocial: Target Goals: Acknowledge presence or absence of depression, maximize coping skills, provide positive support system. Participant is able to verbalize types and ability to use techniques and skills needed for reducing stress and depression.  Initial Review & Psychosocial Screening:   Quality of Life Scores:   PHQ-9:     Recent Review Flowsheet Data    Depression screen [facility] 2/9 07/28/2015 07/14/2014   Decreased Interest 0 0   Down, Depressed, Hopeless 0 0   PHQ - 2 Score 0 0      Psychosocial Evaluation and Intervention:   Psychosocial Re-Evaluation:   Vocational Rehabilitation: Provide vocational rehab assistance to qualifying candidates.   Vocational Rehab Evaluation & Intervention:   Education: Education Goals: Education classes will be provided on a weekly basis, covering required topics. Participant will state understanding/return demonstration of topics presented.  Learning Barriers/Preferences:     Learning Barriers/Preferences - 11/21/15 1436    Learning Barriers/Preferences   Learning Barriers Sight   Learning Preferences Written Material      Education Topics: Hypertension, Hypertension Reduction -Define heart disease and high blood pressure. Discus how high blood pressure affects the body and ways to reduce high blood pressure.   Exercise and Your Heart -Discuss why it is important to exercise, the FITT principles of exercise, normal and abnormal responses to exercise, and how to exercise safely.   Angina -Discuss definition of angina, causes of angina, treatment of angina, and how to decrease risk  of having angina.   Cardiac Medications -Review what the following cardiac medications are used for, how they affect the body, and side effects that may occur when taking the medications.  Medications include Aspirin, Beta blockers, calcium channel blockers, ACE Inhibitors, angiotensin receptor blockers, diuretics, digoxin, and antihyperlipidemics.   Congestive Heart Failure -Discuss the definition of CHF, how to live with CHF, the signs and symptoms of CHF, and how keep track of weight and sodium intake.   Heart Disease and Intimacy -Discus the effect sexual activity has on the heart, how changes occur during intimacy as we age, and safety during sexual activity.   Smoking Cessation / COPD -Discuss different methods to quit smoking, the health benefits of quitting smoking, and the definition of COPD.   Nutrition I: Fats -Discuss the types of cholesterol, what cholesterol does to the heart, and how cholesterol levels can be controlled.   Nutrition II: Labels -Discuss the different components of food labels and how to read food label   Heart Parts and Heart Disease -Discuss the anatomy of the heart, the pathway of blood circulation through the heart, and these are affected by heart disease.   Stress I: Signs and Symptoms -Discuss the causes of stress, how stress may lead to anxiety and depression, and ways to limit stress.   Stress II: Relaxation -Discuss different types of relaxation techniques to limit stress.   Warning Signs of Stroke / TIA -Discuss definition of a stroke, what the signs and symptoms are of a stroke, and how to identify when someone is having stroke.   Knowledge Questionnaire Score:   Personal Goals and Risk Factors at Admission:     Personal Goals and Risk Factors at Admission - 11/21/15 1427    Core Components/Risk Factors/Patient Goals on Admission  Diabetes Yes   Intervention Provide education about signs/symptoms and action to take for  hypo/hyperglycemia.;Provide education about proper nutrition, including hydration, and aerobic/resistive exercise prescription along with prescribed medications to achieve blood glucose in normal ranges: Fasting glucose 65-99 mg/dL   Expected Outcomes Short Term: Participant verbalizes understanding of the signs/symptoms and immediate care of hyper/hypoglycemia, proper foot care and importance of medication, aerobic/resistive exercise and nutrition plan for blood glucose control.;Long Term: Attainment of HbA1C < 7%.   Personal Goal Other Yes   Personal Goal Learn baseline and limits for exercise, able to mow grass, able to dance with granddaughter at wedding (71 yrs old)   Intervention Provide exercise presciption and education to increase knowledge for lifestyle change   Expected Outcomes Able to return to routine at gym with confidence      Personal Goals and Risk Factors Review:    Personal Goals Discharge (Final Personal Goals and Risk Factors Review):    ITP Comments:     ITP Comments      11/22/15 0942           ITP Comments Dr. Radford Pax Medical Director          Comments: Patient attended orientation from 1330 to 1600 to review rules and guidelines for program. Completed 6 minute walk test, Intitial ITP, and exercise prescription.  VSS. Telemetry-sinus rhythm.  Asymptomatic.

## 2015-11-27 ENCOUNTER — Encounter (HOSPITAL_COMMUNITY)
Admission: RE | Admit: 2015-11-27 | Discharge: 2015-11-27 | Disposition: A | Discharge status: Home / Self Care | Payer: Medicare Other | Source: Ambulatory Visit | Attending: Cardiology | Admitting: Cardiology

## 2015-11-27 DIAGNOSIS — Z955 Presence of coronary angioplasty implant and graft: Secondary | ICD-10-CM | POA: Diagnosis not present

## 2015-11-27 DIAGNOSIS — I214 Non-ST elevation (NSTEMI) myocardial infarction: Secondary | ICD-10-CM | POA: Diagnosis not present

## 2015-11-27 LAB — GLUCOSE, CAPILLARY
Glucose-Capillary: 95 mg/dL (ref 65–99)
Glucose-Capillary: 133 mg/dL — ABNORMAL HIGH (ref 65–99)

## 2015-11-27 NOTE — Progress Notes (Signed)
Daily Session Note  Patient Details  Name: Charles [name] MRN: [id number] Date of Birth: 02-Oct-1940 Referring Provider:  Charolette Forward, MD  Encounter Date: 11/27/2015  Check In:     Session Check In - 11/27/15 1127    Check-In   Location [facility]   Staff Present Maurice Small, RN, BSN;Jessica [place], MA, ACSM RCEP, Exercise Physiologist;Joann Rion, RN, BSN;Amber Fair, MS, ACSM RCEP, Exercise Physiologist   Supervising physician immediately available to respond to emergencies [facility] immediately available   Physician(s) Dr. Marily Memos   Medication changes reported     No   Fall or balance concerns reported    No   Warm-up and Cool-down Performed as group-led instruction   Resistance Training Performed Yes   VAD Patient? No   Pain Assessment   Currently in Pain? No/denies      Capillary Blood Glucose: Results for orders placed or performed during the hospital encounter of 11/27/15 (from the past 24 hour(s))  Glucose, capillary     Status: None   Collection Time: 11/27/15 11:11 AM  Result Value Ref Range   Glucose-Capillary 95 65 - 99 mg/dL  Glucose, capillary     Status: Abnormal   Collection Time: 11/27/15 12:07 PM  Result Value Ref Range   Glucose-Capillary 133 (H) 65 - 99 mg/dL     Goals Met:  Personal goals reviewed  Goals Unmet: ssf   Comments: Pt started cardiac rehab today.  Pt tolerated light exercise without difficulty. VSS, telemetry-SR, asymptomatic.  Medication list reconciled. Pt denies barriers to medicaiton compliance.  PSYCHOSOCIAL ASSESSMENT:  PHQ-0. Pt exhibits positive coping skills, hopeful outlook with supportive family which includes his three daughters. No psychosocial needs identified at this time, no psychosocial interventions necessary.    Pt enjoys visiting his daughter that lives in [place] and [place].  Pt also enjoys going to the [facility]. Reviewed with pt his personal goals for cardiac rehab. Short term goal learn  baseline about self and long term goal is to learn limitations when returning back to the [facility].  Pt exercise 5 x week at the gym doing aerobic and light weight lifting; dance with his 28 year old granddaughter at an upcoming wedding and able to mow the grass.  Pt is hopeful that participating in the rehab program will give him that understanding of exercise and his new limitations recovering from a heart attack.   Pt oriented to exercise equipment and routine.    Understanding verbalized. Maurice Small RN, BSN     Dr. Fransico Him is Medical Director for Cardiac Rehab at [facility].

## 2015-11-29 ENCOUNTER — Encounter (HOSPITAL_COMMUNITY)
Admission: RE | Admit: 2015-11-29 | Discharge: 2015-11-29 | Disposition: A | Discharge status: Home / Self Care | Payer: Medicare Other | Source: Ambulatory Visit | Attending: Cardiology | Admitting: Cardiology

## 2015-11-29 ENCOUNTER — Encounter: Payer: Self-pay | Admitting: Internal Medicine

## 2015-11-29 DIAGNOSIS — I214 Non-ST elevation (NSTEMI) myocardial infarction: Secondary | ICD-10-CM | POA: Diagnosis not present

## 2015-11-29 DIAGNOSIS — Z955 Presence of coronary angioplasty implant and graft: Secondary | ICD-10-CM | POA: Diagnosis not present

## 2015-11-29 LAB — GLUCOSE, CAPILLARY
Glucose-Capillary: 103 mg/dL — ABNORMAL HIGH (ref 65–99)
Glucose-Capillary: 91 mg/dL (ref 65–99)

## 2015-11-29 NOTE — Progress Notes (Signed)
Daily Session Note  Patient Details  Name: Charles [name] MRN: [id number] Date of Birth: 1941/09/08 Referring Provider:  Dr. Radford Pax  Encounter Date: 11/29/2015  Check In:     Session Check In - 11/29/15 1145    Check-In   Location [facility]   Staff Present Maurice Small, RN, BSN;Jessica [place], MA, ACSM RCEP, Exercise Physiologist;Joann Rion, RN, Marga Melnick, RN, BSN   Supervising physician immediately available to respond to emergencies [facility] immediately available   Physician(s) Dr. Marily Memos   Medication changes reported     No   Fall or balance concerns reported    No   Warm-up and Cool-down Performed as group-led instruction   Resistance Training Performed No   VAD Patient? No   Pain Assessment   Currently in Pain? No/denies      Capillary Blood Glucose: No results found for this or any previous visit (from the past 24 hour(s)).   Goals Met:  No report of cardiac concerns or symptoms  Goals Unmet:  Not Applicable  Comments: QUALITY OF LIFE SCORE REVIEW  Pt completed Quality of Life survey as a participant in Cardiac Rehab. Scores 21.0 or below are considered low.      Quality of Life - 11/22/15 1639    Quality of Life Scores   Health/Function Pre 30 %   Socioeconomic Pre 30 %   Psych/Spiritual Pre 28.29 %   Family Pre 28.5 %   GLOBAL Pre 29.4 %     Pt scored well above the threshold for low.  Pt demonstrates healthy and positive outlook on life.  Pt has supportive family.  Pt with some concerns regarding the health of his two granddaughters.  One granddaughter is severely handicapped who requires 24/7 care and the other granddaughter who had cardiac surgery at age day 2.  Pt worries some about the future but is thankful that they are able to maintain some quality of life. Offered emotional support and reassurance.  Will continue to monitor and intervene as necessary.  Maurice Small RN, BSN      Dr. Fransico Him is  Medical Director for Cardiac Rehab at [facility].

## 2015-11-30 DIAGNOSIS — I252 Old myocardial infarction: Secondary | ICD-10-CM | POA: Diagnosis not present

## 2015-11-30 DIAGNOSIS — I10 Essential (primary) hypertension: Secondary | ICD-10-CM | POA: Diagnosis not present

## 2015-11-30 DIAGNOSIS — I480 Paroxysmal atrial fibrillation: Secondary | ICD-10-CM | POA: Diagnosis not present

## 2015-11-30 DIAGNOSIS — I2510 Atherosclerotic heart disease of native coronary artery without angina pectoris: Secondary | ICD-10-CM | POA: Diagnosis not present

## 2015-11-30 DIAGNOSIS — E119 Type 2 diabetes mellitus without complications: Secondary | ICD-10-CM | POA: Diagnosis not present

## 2015-12-01 ENCOUNTER — Encounter (HOSPITAL_COMMUNITY): Payer: Medicare Other

## 2015-12-04 ENCOUNTER — Encounter (HOSPITAL_COMMUNITY): Admission: RE | Admit: 2015-12-04 | Payer: Medicare Other | Source: Ambulatory Visit

## 2015-12-04 ENCOUNTER — Telehealth (HOSPITAL_COMMUNITY): Payer: Self-pay | Admitting: *Deleted

## 2015-12-06 ENCOUNTER — Encounter (HOSPITAL_COMMUNITY): Payer: Medicare Other

## 2015-12-07 NOTE — Progress Notes (Signed)
Cardiac Individual Treatment Plan  Patient Details  Name: Charles [name] MRN: [id number] Date of Birth: 1941-01-07 Referring Provider:  Charolette Forward, MD  Initial Encounter Date:       CARDIAC REHAB PHASE II ORIENTATION from 11/21/2015 in [facility]   Date  11/21/15      Visit Diagnosis: No diagnosis found.  Patient's Home Medications on Admission:  Current outpatient prescriptions:  .  amiodarone (PACERONE) 200 MG tablet, Take 1 tablet (200 mg total) by mouth daily., Disp: 30 tablet, Rfl: 1 .  aspirin 81 MG chewable tablet, Chew 1 tablet (81 mg total) by mouth daily., Disp: , Rfl:  .  atorvastatin (LIPITOR) 40 MG tablet, Take 1 tablet (40 mg total) by mouth daily at 6 PM., Disp: 30 tablet, Rfl: 3 .  Cyanocobalamin (VITAMIN B-12 IJ), Inject 1 mL as directed every 30 (thirty) days., Disp: , Rfl:  .  EPINEPHrine (EPIPEN 2-PAK) 0.3 mg/0.3 mL DEVI, Inject 0.3 mg into the muscle once.  , Disp: , Rfl:  .  glipiZIDE (GLUCOTROL) 10 MG tablet, Take one-half tablet by  mouth two times daily  before meals, Disp: 90 tablet, Rfl: 1 .  lisinopril (PRINIVIL,ZESTRIL) 5 MG tablet, Take 1 tablet (5 mg total) by mouth daily., Disp: 30 tablet, Rfl: 3 .  metFORMIN (GLUCOPHAGE) 1000 MG tablet, Take 1 tablet (1,000 mg total) by mouth 2 (two) times daily with a meal., Disp: 180 tablet, Rfl: 1 .  Multiple Vitamins-Minerals (ICAPS PO), Take 1 tablet by mouth daily., Disp: , Rfl:  .  nitroGLYCERIN (NITROSTAT) 0.4 MG SL tablet, Place 1 tablet (0.4 mg total) under the tongue every 5 (five) minutes as needed for chest pain., Disp: 25 tablet, Rfl: 1 .  ONETOUCH VERIO test strip, USE ONE STRIP TO CHECK GLUCOSE ONCE DAILY, Disp: 100 each, Rfl: 3 .  ticagrelor (BRILINTA) 90 MG TABS tablet, Take 1 tablet (90 mg total) by mouth 2 (two) times daily., Disp: 60 tablet, Rfl: 3  Past Medical History: Past Medical History  Diagnosis Date  . Hyperlipidemia   . Obesity   . Hypertension   .  Eczema   . Biceps muscle tear     right  . Hernia, ventral   . AVM (arteriovenous malformation) of colon     2 - non-bleeding 2013  . Personal history of adenomatous colonic polyps 07/20/2012    3 + adenomas 2009 07/20/2012 2 diminutive polyps    . Coronary artery disease   . Pneumonia 1942; 1950s X 1  . Walking pneumonia 2000's X 1  . Type II diabetes mellitus ([place])   . Anemia     "when I was a lad"  . GERD (gastroesophageal reflux disease)   . Arthritis     "right femur" (10/19/2015)  . 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 Former Smoker -- 20 years  . Types: Cigarettes, Pipe, Cigars  . Quit date: 07/06/2012  Smokeless tobacco  . Never Used    Labs: Recent Review Flowsheet Data    Labs for ITP Cardiac and Pulmonary Rehab Latest Ref Rng 04/14/2015 07/14/2015 07/21/2015 10/13/2015 10/19/2015   Cholestrol 0 - 200 mg/dL - - 186 - 175   LDLCALC 0 - 99 mg/dL - - 106(H) - 107(H)   HDL >40 mg/dL - - 44.10 - 52   Trlycerides <150 mg/dL - - 182.0(H) - 79   Hemoglobin A1c 4.8 -  5.6 % 5.9 6.0 6.4 6.3 6.7(H)      Capillary Blood Glucose: Lab Results  Component Value Date   GLUCAP 91 11/29/2015   GLUCAP 103* 11/29/2015   GLUCAP 133* 11/27/2015   GLUCAP 95 11/27/2015   GLUCAP 162* 10/22/2015     Exercise Target Goals:    Exercise Program Goal: Individual exercise prescription set with THRR, safety & activity barriers. Participant demonstrates ability to understand and report RPE using BORG scale, to self-measure pulse accurately, and to acknowledge the importance of the exercise prescription.  Exercise Prescription Goal: Starting with aerobic activity 30 plus minutes a day, 3 days per week for initial exercise prescription. Provide home exercise prescription and guidelines that participant acknowledges understanding prior to discharge.  Activity Barriers & Risk Stratification:     Activity Barriers & Cardiac Risk  Stratification - 11/21/15 1425    Activity Barriers & Cardiac Risk Stratification   Activity Barriers Other (comment);Deconditioning;Arthritis   Comments broke femur in 07/2013   Cardiac Risk Stratification High      6 Minute Walk:     6 Minute Walk      11/21/15 1600       6 Minute Walk   Phase Initial     Distance 2016 feet     Walk Time 6 minutes     # of Rest Breaks 0     MPH 3.82     METS 3.86     RPE 13     VO2 Peak 13.5     Symptoms No     Resting HR 79 bpm     Resting BP 118/68 mmHg     Max Ex. HR 117 bpm     Max Ex. BP 138/74 mmHg        Initial Exercise Prescription:     Initial Exercise Prescription - 11/21/15 1600    Date of Initial Exercise Prescription   Date 11/21/15   Bike   Level 1.7   Minutes 10   METs 3.8   NuStep   Level 3   Minutes 10   METs 2   Track   Laps 12   Minutes 10   METs 3.07   Prescription Details   Frequency (times per week) 3   Duration Progress to 30 minutes of continuous aerobic without signs/symptoms of physical distress   Intensity   THRR 40-80% of Max Heartrate 69-138   Ratings of Perceived Exertion 11-13   Progression   Progression Continue to progress workloads to maintain intensity without signs/symptoms of physical distress.   Resistance Training   Training Prescription Yes   Weight 2 lbs   Reps 10-12      Perform Capillary Blood Glucose checks as needed.  Exercise Prescription Changes:     Exercise Prescription Changes      12/05/15 1600           Exercise Review   Progression Yes       Response to Exercise   Blood Pressure (Admit) 112/64 mmHg       Blood Pressure (Exercise) 132/70 mmHg       Blood Pressure (Exit) 120/64 mmHg       Heart Rate (Admit) 76 bpm       Heart Rate (Exercise) 123 bpm       Heart Rate (Exit) 83 bpm       Rating of Perceived Exertion (Exercise) 12       Duration Progress to 30 minutes of continuous aerobic without  signs/symptoms of physical distress        Intensity THRR unchanged       Progression   Progression Continue to progress workloads to maintain intensity without signs/symptoms of physical distress.       Average METs 3.98       Resistance Training   Training Prescription Yes       Weight 2 lbs       Reps 10-12       Interval Training   Interval Training No       Bike   Level 2.3       Minutes 10       METs 5.03       NuStep   Level 3       Minutes 10       METs 2.8       Track   Laps 14       Minutes 10       METs 3.45          Exercise Comments:     Exercise Comments      11/30/15 1446           Exercise Comments Doing well with exercise tolerance, will continue to follow exericse progression          Discharge Exercise Prescription (Final Exercise Prescription Changes):     Exercise Prescription Changes - 12/05/15 1600    Exercise Review   Progression Yes   Response to Exercise   Blood Pressure (Admit) 112/64 mmHg   Blood Pressure (Exercise) 132/70 mmHg   Blood Pressure (Exit) 120/64 mmHg   Heart Rate (Admit) 76 bpm   Heart Rate (Exercise) 123 bpm   Heart Rate (Exit) 83 bpm   Rating of Perceived Exertion (Exercise) 12   Duration Progress to 30 minutes of continuous aerobic without signs/symptoms of physical distress   Intensity THRR unchanged   Progression   Progression Continue to progress workloads to maintain intensity without signs/symptoms of physical distress.   Average METs 3.98   Resistance Training   Training Prescription Yes   Weight 2 lbs   Reps 10-12   Interval Training   Interval Training No   Bike   Level 2.3   Minutes 10   METs 5.03   NuStep   Level 3   Minutes 10   METs 2.8   Track   Laps 14   Minutes 10   METs 3.45      Nutrition:  Target Goals: Understanding of nutrition guidelines, daily intake of sodium 1500mg , cholesterol 200mg , calories 30% from fat and 7% or less from saturated fats, daily to have 5 or more servings of fruits and  vegetables.  Biometrics:     Pre Biometrics - 11/21/15 1607    Pre Biometrics   Height 5\' 11"  (1.803 m)   Weight 237 lb 14 oz (107.9 kg)   Waist Circumference 44 inches   Hip Circumference 41 inches   Waist to Hip Ratio 1.07 %   BMI (Calculated) 33.2   Triceps Skinfold 15 mm   % Body Fat 31.3 %   Grip Strength 43 kg   Flexibility 12 in   Single Leg Stand 3.65 seconds       Nutrition Therapy Plan and Nutrition Goals:   Nutrition Discharge: Nutrition Scores:   Nutrition Goals Re-Evaluation:   Psychosocial: Target Goals: Acknowledge presence or absence of depression, maximize coping skills, provide positive support system. Participant is able to verbalize types and  ability to use techniques and skills needed for reducing stress and depression.  Initial Review & Psychosocial Screening:   Quality of Life Scores:     Quality of Life - 11/22/15 1639    Quality of Life Scores   Health/Function Pre 30 %   Socioeconomic Pre 30 %   Psych/Spiritual Pre 28.29 %   Family Pre 28.5 %   GLOBAL Pre 29.4 %      PHQ-9:     Recent Review Flowsheet Data    Depression screen [facility] 2/9 11/27/2015 07/28/2015 07/14/2014   Decreased Interest 0 0 0   Down, Depressed, Hopeless 0 0 0   PHQ - 2 Score 0 0 0      Psychosocial Evaluation and Intervention:   Psychosocial Re-Evaluation:   Vocational Rehabilitation: Provide vocational rehab assistance to qualifying candidates.   Vocational Rehab Evaluation & Intervention:     Vocational Rehab - 11/27/15 1534    Initial Vocational Rehab Evaluation & Intervention   Assessment shows need for Vocational Rehabilitation (p) No      Education: Education Goals: Education classes will be provided on a weekly basis, covering required topics. Participant will state understanding/return demonstration of topics presented.  Learning Barriers/Preferences:     Learning Barriers/Preferences - 11/21/15 1436    Learning Barriers/Preferences    Learning Barriers Sight  glasses   Learning Preferences Written Material      Education Topics: Count Your Pulse:  -Group instruction provided by verbal instruction, demonstration, patient participation and written materials to support subject.  Instructors address importance of being able to find your pulse and how to count your pulse when at home without a heart monitor.  Patients get hands on experience counting their pulse with staff help and individually.   Heart Attack, Angina, and Risk Factor Modification:  -Group instruction provided by verbal instruction, video, and written materials to support subject.  Instructors address signs and symptoms of angina and heart attacks.    Also discuss risk factors for heart disease and how to make changes to improve heart health risk factors.   Functional Fitness:  -Group instruction provided by verbal instruction, demonstration, patient participation, and written materials to support subject.  Instructors address safety measures for doing things around the house.  Discuss how to get up and down off the floor, how to pick things up properly, how to safely get out of a chair without assistance, and balance training.   Meditation and Mindfulness:  -Group instruction provided by verbal instruction, patient participation, and written materials to support subject.  Instructor addresses importance of mindfulness and meditation practice to help reduce stress and improve awareness.  Instructor also leads participants through a meditation exercise.    Stretching for Flexibility and Mobility:  -Group instruction provided by verbal instruction, patient participation, and written materials to support subject.  Instructors lead participants through series of stretches that are designed to increase flexibility thus improving mobility.  These stretches are additional exercise for major muscle groups that are typically performed during regular warm up and cool  down.   Hands Only CPR Anytime:  -Group instruction provided by verbal instruction, video, patient participation and written materials to support subject.  Instructors co-teach with AHA video for hands only CPR.  Participants get hands on experience with mannequins.   Nutrition I class: Heart Healthy Eating:  -Group instruction provided by PowerPoint slides, verbal discussion, and written materials to support subject matter. The instructor gives an explanation and review of the Therapeutic Lifestyle Changes  diet recommendations, which includes a discussion on lipid goals, dietary fat, sodium, fiber, plant stanol/sterol esters, sugar, and the components of a well-balanced, healthy diet.   Nutrition II class: Lifestyle Skills:  -Group instruction provided by PowerPoint slides, verbal discussion, and written materials to support subject matter. The instructor gives an explanation and review of label reading, grocery shopping for heart health, heart healthy recipe modifications, and ways to make healthier choices when eating out.   Diabetes Question & Answer:  -Group instruction provided by PowerPoint slides, verbal discussion, and written materials to support subject matter. The instructor gives an explanation and review of diabetes co-morbidities, pre- and post-prandial blood glucose goals, pre-exercise blood glucose goals, signs, symptoms, and treatment of hypoglycemia and hyperglycemia, and foot care basics.   Diabetes Blitz:  -Group instruction provided by PowerPoint slides, verbal discussion, and written materials to support subject matter. The instructor gives an explanation and review of the physiology behind type 1 and type 2 diabetes, diabetes medications and rational behind using different medications, pre- and post-prandial blood glucose recommendations and Hemoglobin A1c goals, diabetes diet, and exercise including blood glucose guidelines for exercising safely.    Portion Distortion:   -Group instruction provided by PowerPoint slides, verbal discussion, written materials, and food models to support subject matter. The instructor gives an explanation of serving size versus portion size, changes in portions sizes over the last 20 years, and what consists of a serving from each food group.   Stress Management:  -Group instruction provided by verbal instruction, video, and written materials to support subject matter.  Instructors review role of stress in heart disease and how to cope with stress positively.     Exercising on Your Own:  -Group instruction provided by verbal instruction, power point, and written materials to support subject.  Instructors discuss benefits of exercise, components of exercise, frequency and intensity of exercise, and end points for exercise.  Also discuss use of nitroglycerin and activating EMS.  Review options of places to exercise outside of rehab.  Review guidelines for sex with heart disease.   Cardiac Drugs I:  -Group instruction provided by verbal instruction and written materials to support subject.  Instructor reviews cardiac drug classes: antiplatelets, anticoagulants, beta blockers, and statins.  Instructor discusses reasons, side effects, and lifestyle considerations for each drug class.   Cardiac Drugs II:  -Group instruction provided by verbal instruction and written materials to support subject.  Instructor reviews cardiac drug classes: angiotensin converting enzyme inhibitors (ACE-I), angiotensin II receptor blockers (ARBs), nitrates, and calcium channel blockers.  Instructor discusses reasons, side effects, and lifestyle considerations for each drug class.   Anatomy and Physiology of the Circulatory System:  -Group instruction provided by verbal instruction, video, and written materials to support subject.  Reviews functional anatomy of heart, how it relates to various diagnoses, and what role the heart plays in the overall  system.   Knowledge Questionnaire Score:     Knowledge Questionnaire Score - 11/22/15 1639    Knowledge Questionnaire Score   Pre Score 21/24      Core Components/Risk Factors/Patient Goals at Admission:     Personal Goals and Risk Factors at Admission - 11/21/15 1427    Core Components/Risk Factors/Patient Goals on Admission   Diabetes Yes   Intervention Provide education about signs/symptoms and action to take for hypo/hyperglycemia.;Provide education about proper nutrition, including hydration, and aerobic/resistive exercise prescription along with prescribed medications to achieve blood glucose in normal ranges: Fasting glucose 65-99 mg/dL   Expected  Outcomes Short Term: Participant verbalizes understanding of the signs/symptoms and immediate care of hyper/hypoglycemia, proper foot care and importance of medication, aerobic/resistive exercise and nutrition plan for blood glucose control.;Long Term: Attainment of HbA1C < 7%.   Hypertension Yes   Intervention Provide education on lifestyle modifcations including regular physical activity/exercise, weight management, moderate sodium restriction and increased consumption of fresh fruit, vegetables, and low fat dairy, alcohol moderation, and smoking cessation.;Monitor prescription use compliance.   Expected Outcomes Short Term: Continued assessment and intervention until BP is < 140/110mm HG in hypertensive participants. < 130/3mm HG in hypertensive participants with diabetes, heart failure or chronic kidney disease.;Long Term: Maintenance of blood pressure at goal levels.   Lipids Yes   Intervention Provide education and support for participant on nutrition & aerobic/resistive exercise along with prescribed medications to achieve LDL 70mg , HDL >40mg .   Expected Outcomes Short Term: Participant states understanding of desired cholesterol values and is compliant with medications prescribed. Participant is following exercise prescription and  nutrition guidelines.;Long Term: Cholesterol controlled with medications as prescribed, with individualized exercise RX and with personalized nutrition plan. Value goals: LDL < 70mg , HDL > 40 mg.   Personal Goal Other Yes   Personal Goal Learn baseline and limits for exercise, able to mow grass, able to dance with granddaughter at wedding (61 yrs old)   Intervention Provide exercise presciption and education to increase knowledge for lifestyle change   Expected Outcomes Able to return to routine at gym with confidence      Core Components/Risk Factors/Patient Goals Review:      Goals and Risk Factor Review      12/05/15 1635           Core Components/Risk Factors/Patient Goals Review   Personal Goals Review Hypertension;Other       Review Pt is having blood pressure checked regularly in program.  He has had some elevated responses to exercise on the bike.  He is following prescription provide in program.  We will discuss home exercise in near futrure.       Expected Outcomes Able to return to gym on own          Core Components/Risk Factors/Patient Goals at Discharge (Final Review):      Goals and Risk Factor Review - 12/05/15 1635    Core Components/Risk Factors/Patient Goals Review   Personal Goals Review Hypertension;Other   Review Pt is having blood pressure checked regularly in program.  He has had some elevated responses to exercise on the bike.  He is following prescription provide in program.  We will discuss home exercise in near futrure.   Expected Outcomes Able to return to gym on own      ITP Comments:     ITP Comments      11/22/15 0942           ITP Comments Dr. Radford Pax Medical Director          Comments: 29 DAY ITP REVIEW Pt is making expected progress toward personal goals after completing 2 sessions. Pt with extended absence due to medical emergency with grandchild.  Pt plans to return to cardiac rehab next week.  Recommend continued exercise and life  style modification education including  stress management and relaxation techniques to decrease cardiac risk profile. [name], BSN

## 2015-12-08 ENCOUNTER — Encounter (HOSPITAL_COMMUNITY): Payer: Medicare Other

## 2015-12-11 ENCOUNTER — Encounter (HOSPITAL_COMMUNITY)
Admission: RE | Admit: 2015-12-11 | Discharge: 2015-12-11 | Disposition: A | Discharge status: Home / Self Care | Payer: Medicare Other | Source: Ambulatory Visit | Attending: Cardiology | Admitting: Cardiology

## 2015-12-11 DIAGNOSIS — I214 Non-ST elevation (NSTEMI) myocardial infarction: Secondary | ICD-10-CM | POA: Insufficient documentation

## 2015-12-11 DIAGNOSIS — Z955 Presence of coronary angioplasty implant and graft: Secondary | ICD-10-CM | POA: Insufficient documentation

## 2015-12-13 ENCOUNTER — Encounter (HOSPITAL_COMMUNITY): Admission: RE | Admit: 2015-12-13 | Payer: Medicare Other | Source: Ambulatory Visit

## 2015-12-13 ENCOUNTER — Other Ambulatory Visit: Payer: Self-pay | Admitting: Internal Medicine

## 2015-12-14 ENCOUNTER — Other Ambulatory Visit (INDEPENDENT_AMBULATORY_CARE_PROVIDER_SITE_OTHER): Payer: Medicare Other | Admitting: *Deleted

## 2015-12-14 ENCOUNTER — Ambulatory Visit (INDEPENDENT_AMBULATORY_CARE_PROVIDER_SITE_OTHER): Payer: Medicare Other | Admitting: *Deleted

## 2015-12-14 DIAGNOSIS — E538 Deficiency of other specified B group vitamins: Secondary | ICD-10-CM

## 2015-12-14 MED ORDER — CYANOCOBALAMIN 1000 MCG/ML IJ SOLN
1000.0000 ug | Freq: Once | INTRAMUSCULAR | Status: AC
  Administered 2015-12-14: 1000 ug via INTRAMUSCULAR

## 2015-12-15 ENCOUNTER — Encounter (HOSPITAL_COMMUNITY): Payer: Medicare Other

## 2015-12-18 ENCOUNTER — Encounter (HOSPITAL_COMMUNITY)
Admission: RE | Admit: 2015-12-18 | Discharge: 2015-12-18 | Disposition: A | Discharge status: Home / Self Care | Payer: Medicare Other | Source: Ambulatory Visit | Attending: Cardiology | Admitting: Cardiology

## 2015-12-18 DIAGNOSIS — I214 Non-ST elevation (NSTEMI) myocardial infarction: Secondary | ICD-10-CM | POA: Diagnosis not present

## 2015-12-18 DIAGNOSIS — Z955 Presence of coronary angioplasty implant and graft: Secondary | ICD-10-CM | POA: Diagnosis present

## 2015-12-18 LAB — GLUCOSE, CAPILLARY
Glucose-Capillary: 186 mg/dL — ABNORMAL HIGH (ref 65–99)
Glucose-Capillary: 132 mg/dL — ABNORMAL HIGH (ref 65–99)

## 2015-12-20 ENCOUNTER — Encounter (HOSPITAL_COMMUNITY)
Admission: RE | Admit: 2015-12-20 | Discharge: 2015-12-20 | Disposition: A | Discharge status: Home / Self Care | Payer: Medicare Other | Source: Ambulatory Visit | Attending: Cardiology | Admitting: Cardiology

## 2015-12-20 DIAGNOSIS — I214 Non-ST elevation (NSTEMI) myocardial infarction: Secondary | ICD-10-CM | POA: Diagnosis not present

## 2015-12-20 LAB — GLUCOSE, CAPILLARY
GLUCOSE-CAPILLARY: 122 mg/dL — AB (ref 65–99)
Glucose-Capillary: 84 mg/dL (ref 65–99)

## 2015-12-20 NOTE — Progress Notes (Signed)
Incomplete Session Note  Patient Details  Name: Charles [name] MRN: [id number] Date of Birth: [birth year] Referring Provider: Theadora Rama did not complete his rehab session.  Pt pre exercise blood glucose was 84.  Pt given banana and lemonade.  Rechecked after 20 minutes 122.  Reviewed with pt parameters for blood glucose for exercise.  Pt  Verbalized understanding. [name], BSN

## 2015-12-20 NOTE — Progress Notes (Signed)
Reviewed home exercise with pt today.  Pt plans to continue walking and going to [facility] for exercise.  Reviewed THR, pulse, RPE, sign and symptoms, NTG use, and when to call 911 or MD.  Also discussed weather considerations and indoor options.  Pt voiced understanding. Alberteen Sam, MA, ACSM RCEP

## 2015-12-22 ENCOUNTER — Encounter (HOSPITAL_COMMUNITY): Admission: RE | Admit: 2015-12-22 | Payer: Medicare Other | Source: Ambulatory Visit

## 2015-12-25 ENCOUNTER — Encounter (HOSPITAL_COMMUNITY)
Admission: RE | Admit: 2015-12-25 | Discharge: 2015-12-25 | Disposition: A | Discharge status: Home / Self Care | Payer: Medicare Other | Source: Ambulatory Visit | Attending: Cardiology | Admitting: Cardiology

## 2015-12-25 DIAGNOSIS — I214 Non-ST elevation (NSTEMI) myocardial infarction: Secondary | ICD-10-CM

## 2015-12-25 LAB — GLUCOSE, CAPILLARY
GLUCOSE-CAPILLARY: 93 mg/dL (ref 65–99)
Glucose-Capillary: 84 mg/dL (ref 65–99)

## 2015-12-25 NOTE — Progress Notes (Signed)
Incomplete Session Note  Patient Details  Name: Charles [name] MRN: [id number] Date of Birth: [birth year] Referring Provider:        CARDIAC REHAB PHASE II EXERCISE from 12/20/2015 in [facility]   Referring Provider  Charolette Forward MD      [name] did not complete his rehab session.  Pt pre exercise blood glucose was 84. Pt had bowl of fruit and boiled egg for breakfast.  Pt ate a few crackers on his way to exercise.  Pt given banana and lemonade.  Rechecked after 15 minutes. Blood glucose increased to 93.  Extensive dietician consult with pt.  Alternatives offered for meals and advisement on medication administration. [name], BSN

## 2015-12-25 NOTE — Progress Notes (Signed)
Charles [name] 75 y.o. male Nutrition Note Spoke with pt. Nutrition Plan and Nutrition Survey goals reviewed with pt. Pt is following Step 2 of the Therapeutic Lifestyle Changes diet. Pt wants to lose wt. Pt has been trying to lose wt by following the [place] diet plan, which is "similar to Atkins but recommends low-fat dairy and meats." Pt reports he has lost 20 lb on the [employer]. Healthier wt loss plans/recommendations reviewed. Pt is diabetic. Last A1c indicates blood glucose well-controlled. This Probation officer went over Diabetes Education test results. Pt checks fasting CBG's daily. Fasting CBG's reportedly 88-119 mg/dL "normally." Fasting CBG this am 160 mg/dL. Pt CBG's have been too low to exercise recently. Pt taking 5 mg Glipizide and 1000 mg Metformin BID. The action of Glipizide discussed. Pt reports he typically eats berries and yogurt for breakfast. Per discussion, pt uses some canned/convenience foods. Pt has tried the No Salt Added canned vegetables and does not like them. Pt rarely adds salt to food. Pt eats out infrequently. Pt expressed understanding of the information reviewed. Pt aware of nutrition education classes offered.  Lab Results  Component Value Date   HGBA1C 6.7* 10/19/2015   Wt Readings from Last 3 Encounters:  11/21/15 237 lb 14 oz (107.9 kg)  10/26/15 238 lb 1.6 oz (108.001 kg)  10/21/15 247 lb 14.4 oz (112.447 kg)    Nutrition Diagnosis ? Food-and nutrition-related knowledge deficit related to lack of exposure to information as related to diagnosis of: ? CVD ? DM ? Obesity related to excessive energy intake as evidenced by a BMI of 33.2  Nutrition RX/ Estimated Daily Nutrition Needs for: wt loss 1800-2300 Kcal, 45-60 gm fat, 11-15 gm sat fat, 1.7-2.3 gm trans-fat, <1500 mg sodium, 250-325 gm CHO   Nutrition Intervention ? Pt to take Glipizide and Metformin with Lunch and Dinner to see if pre-exercise CBG's improve ? Pt's individual nutrition plan reviewed with pt.   ? Benefits of adopting Therapeutic Lifestyle Changes discussed when Medficts reviewed. ? Pt to attend the Portion Distortion class ? Pt to attend the Diabetes Q & A class ? Pt given handouts for: ? Nutrition I class ? Nutrition II class ? Diabetes Blitz Class ? Continue client-centered nutrition education by RD, as part of interdisciplinary care. Goal(s) ? Pt to identify and limit food sources of saturated fat, trans fat, and sodium ? Pt to identify food quantities necessary to achieve weight loss of 6-24 lb (2.7-10.9 kg) at graduation from cardiac rehab.  ? Use CBG's and A1c to determine whether adjustments in food/meal planning will be beneficial or if any meds need to be combined with nutrition therapy. Monitor and Evaluate progress toward nutrition goal with team. Derek Mound, M.Ed, RD, LDN, CDE 12/25/2015 1:30 PM

## 2015-12-27 ENCOUNTER — Encounter (HOSPITAL_COMMUNITY)
Admission: RE | Admit: 2015-12-27 | Discharge: 2015-12-27 | Disposition: A | Discharge status: Home / Self Care | Payer: Medicare Other | Source: Ambulatory Visit | Attending: Cardiology | Admitting: Cardiology

## 2015-12-27 DIAGNOSIS — I214 Non-ST elevation (NSTEMI) myocardial infarction: Secondary | ICD-10-CM

## 2015-12-27 DIAGNOSIS — Z955 Presence of coronary angioplasty implant and graft: Secondary | ICD-10-CM

## 2015-12-27 LAB — GLUCOSE, CAPILLARY
GLUCOSE-CAPILLARY: 83 mg/dL (ref 65–99)
GLUCOSE-CAPILLARY: 152 mg/dL — AB (ref 65–99)

## 2015-12-27 NOTE — Progress Notes (Signed)
Incomplete Session Note  Patient Details  Name: Charles [name] MRN: [id number] Date of Birth: [birth year] Referring Provider:        CARDIAC REHAB PHASE II EXERCISE from 12/20/2015 in [place]   Referring Provider  Charolette Forward MD      Charles [name] did not complete his rehab session. Pt pre exercise blood glucose is 83.  Pt ate breakfast at 7.  Pt did not take his glipizide.  Unfortunately he did not eat anything closer to exercise time.  Pt given banana and lemonade.  CBg increased to 152 after 20 minutes. Pt advised to please eat something if it has been more than 2 hours since last meal.  Pt to return to exercise on Monday. Will follow up then. Dietician reviewed appropriate meals and snacks prior to exercise. [name], BSN

## 2015-12-27 NOTE — Progress Notes (Signed)
Charles [name] 75 y.o. male Nutrition Note Spoke with pt. Pt pre-exercise CBG 84 mg/dL. Fasting CBG this morning reportedly 83 mg/dL. Pt ate breakfast, which consisted of an English muffin with fruit spread and a piece of toast with peanut butter at 7 am. Pt did not eat anything 1-2 hr prior to exercise. This Probation officer empasized the need for pt to eat a pre-exercise snack 1-2 hours before exercise. Pt encouraged to check his pre-exercise CBG at home and call if CBG < 110 mg/dL. Pt expressed understanding of the information reviewed. Continue client-centered nutrition education by RD as part of interdisciplinary care.  Monitor and evaluate progress toward nutrition goal with team.  Derek Mound, M.Ed, RD, LDN, CDE 12/27/2015 11:42 AM

## 2015-12-29 ENCOUNTER — Encounter (HOSPITAL_COMMUNITY): Payer: Medicare Other

## 2016-01-01 ENCOUNTER — Encounter (HOSPITAL_COMMUNITY)
Admission: RE | Admit: 2016-01-01 | Discharge: 2016-01-01 | Disposition: A | Discharge status: Home / Self Care | Payer: Medicare Other | Source: Ambulatory Visit | Attending: Cardiology | Admitting: Cardiology

## 2016-01-01 DIAGNOSIS — I214 Non-ST elevation (NSTEMI) myocardial infarction: Secondary | ICD-10-CM | POA: Diagnosis not present

## 2016-01-01 LAB — GLUCOSE, CAPILLARY
Glucose-Capillary: 123 mg/dL — ABNORMAL HIGH (ref 65–99)
Glucose-Capillary: 94 mg/dL (ref 65–99)

## 2016-01-01 NOTE — Progress Notes (Signed)
Cardiac Individual Treatment Plan  Patient Details  Name: Charles [name] MRN: [id number] Date of Birth: [birth year] Referring Provider:        CARDIAC REHAB PHASE II EXERCISE from 12/19/73 in [place]   Referring Provider  Charolette Forward MD      Initial Encounter Date:       CARDIAC REHAB PHASE II EXERCISE from 12/19/73 in [place]   Date  11/21/15   Referring Provider  Charolette Forward MD      Visit Diagnosis: NSTEMI (non-ST elevated myocardial infarction) [facility])  Patient's Home Medications on Admission:  Current outpatient prescriptions:  .  amiodarone (PACERONE) 200 MG tablet, Take 1 tablet (200 mg total) by mouth daily., Disp: 30 tablet, Rfl: 1 .  aspirin 81 MG chewable tablet, Chew 1 tablet (81 mg total) by mouth daily., Disp: , Rfl:  .  atorvastatin (LIPITOR) 40 MG tablet, Take 1 tablet (40 mg total) by mouth daily at 6 PM., Disp: 30 tablet, Rfl: 3 .  Cyanocobalamin (VITAMIN B-12 IJ), Inject 1 mL as directed every 30 (thirty) days., Disp: , Rfl:  .  EPINEPHrine (EPIPEN 2-PAK) 0.3 mg/0.3 mL DEVI, Inject 0.3 mg into the muscle once.  , Disp: , Rfl:  .  glipiZIDE (GLUCOTROL) 10 MG tablet, Take one-half tablet by  mouth two times daily  before meals, Disp: 90 tablet, Rfl: 1 .  lisinopril (PRINIVIL,ZESTRIL) 5 MG tablet, Take 1 tablet (5 mg total) by mouth daily., Disp: 30 tablet, Rfl: 3 .  metFORMIN (GLUCOPHAGE) 1000 MG tablet, Take 1 tablet by mouth two  times daily with meals, Disp: 180 tablet, Rfl: 0 .  Multiple Vitamins-Minerals (ICAPS PO), Take 1 tablet by mouth daily., Disp: , Rfl:  .  nitroGLYCERIN (NITROSTAT) 0.4 MG SL tablet, Place 1 tablet (0.4 mg total) under the tongue every 5 (five) minutes as needed for chest pain., Disp: 25 tablet, Rfl: 1 .  ONETOUCH VERIO test strip, USE ONE STRIP TO CHECK GLUCOSE ONCE DAILY, Disp: 100 each, Rfl: 3 .  ticagrelor (BRILINTA) 90 MG TABS tablet, Take 1 tablet (90 mg total)  by mouth 2 (two) times daily., Disp: 60 tablet, Rfl: 3  Past Medical History: Past Medical History  Diagnosis Date  . Hyperlipidemia   . Obesity   . Hypertension   . Eczema   . Biceps muscle tear     right  . Hernia, ventral   . AVM (arteriovenous malformation) of colon     2 - non-bleeding 2013  . Personal history of adenomatous colonic polyps 07/20/2012    3 + adenomas 2009 07/20/2012 2 diminutive polyps    . Coronary artery disease   . Pneumonia 1942; 1950s X 1  . Walking pneumonia 2000's X 1  . Type II diabetes mellitus ([place])   . Anemia     "when I was a lad"  . GERD (gastroesophageal reflux disease)   . Arthritis     "right femur" (10/19/2015)  . 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 Former Smoker -- 20 years  . Types: Cigarettes, Pipe, Cigars  . Quit date: 07/06/2012  Smokeless tobacco  . Never Used    Labs: Recent Review Flowsheet Data    Labs for ITP Cardiac and Pulmonary Rehab Latest Ref Rng 04/14/2015 07/14/2015 07/21/2015 10/13/2015 10/19/2015   Cholestrol 0 - 200 mg/dL - - 186 - 175  LDLCALC 0 - 99 mg/dL - - 106(H) - 107(H)   HDL >40 mg/dL - - 44.10 - 52   Trlycerides <150 mg/dL - - 182.0(H) - 79   Hemoglobin A1c 4.8 - 5.6 % 5.9 6.0 6.4 6.3 6.7(H)      Capillary Blood Glucose: Lab Results  Component Value Date   GLUCAP 94 01/01/2016   GLUCAP 123* 01/01/2016   GLUCAP 152* 12/27/2015   GLUCAP 83 12/27/2015   GLUCAP 93 12/25/2015     Exercise Target Goals:    Exercise Program Goal: Individual exercise prescription set with THRR, safety & activity barriers. Participant demonstrates ability to understand and report RPE using BORG scale, to self-measure pulse accurately, and to acknowledge the importance of the exercise prescription.  Exercise Prescription Goal: Starting with aerobic activity 30 plus minutes a day, 3 days per week for initial exercise prescription. Provide home exercise prescription  and guidelines that participant acknowledges understanding prior to discharge.  Activity Barriers & Risk Stratification:     Activity Barriers & Cardiac Risk Stratification - 11/21/15 1425    Activity Barriers & Cardiac Risk Stratification   Activity Barriers Other (comment);Deconditioning;Arthritis   Comments broke femur in 07/2013   Cardiac Risk Stratification High      6 Minute Walk:     6 Minute Walk      11/21/15 1600       6 Minute Walk   Phase Initial     Distance 2016 feet     Walk Time 6 minutes     # of Rest Breaks 0     MPH 3.82     METS 3.86     RPE 13     VO2 Peak 13.5     Symptoms No     Resting HR 79 bpm     Resting BP 118/68 mmHg     Max Ex. HR 117 bpm     Max Ex. BP 138/74 mmHg        Initial Exercise Prescription:     Initial Exercise Prescription - 12/21/15 1400    Date of Initial Exercise RX and Referring Provider   Date 11/21/15   Referring Provider Charolette Forward MD   Bike   Level 2.3   Minutes 10   METs 5.03   NuStep   Level 3   Minutes 10   METs 2.8   Track   Laps 14   Minutes 10   METs 3.45   Resistance Training   Training Prescription Yes   Weight 2 lbs   Reps 10-12      Perform Capillary Blood Glucose checks as needed.  Exercise Prescription Changes:     Exercise Prescription Changes      12/05/15 1600 12/20/15 1200 12/26/15 0900       Exercise Review   Progression Yes Yes Yes     Response to Exercise   Blood Pressure (Admit) 112/64 mmHg 112/64 mmHg 128/80 mmHg     Blood Pressure (Exercise) 132/70 mmHg 132/70 mmHg 120/66 mmHg     Blood Pressure (Exit) 120/64 mmHg 120/64 mmHg 132/80 mmHg     Heart Rate (Admit) 76 bpm 76 bpm 86 bpm     Heart Rate (Exercise) 123 bpm 123 bpm 126 bpm     Heart Rate (Exit) 83 bpm 83 bpm 86 bpm     Rating of Perceived Exertion (Exercise) 12 12 12      Comments  Home Exercise Given 12/20/15 Home Exercise Given 12/20/15  Duration Progress to 30 minutes of continuous aerobic without  signs/symptoms of physical distress Progress to 30 minutes of continuous aerobic without signs/symptoms of physical distress Progress to 30 minutes of continuous aerobic without signs/symptoms of physical distress     Intensity THRR unchanged THRR unchanged THRR unchanged     Progression   Progression Continue to progress workloads to maintain intensity without signs/symptoms of physical distress. Continue to progress workloads to maintain intensity without signs/symptoms of physical distress. Continue to progress workloads to maintain intensity without signs/symptoms of physical distress.     Average METs 3.98 3.98 3.92     Resistance Training   Training Prescription Yes Yes Yes     Weight 2 lbs 2 lbs 2 lbs     Reps 10-12 10-12 10-12     Interval Training   Interval Training No No No     Bike   Level 2.3 2.3 2.3     Minutes 10 10 10      METs 5.03 5.03 5.03     NuStep   Level 3 3 4      Minutes 10 10 10      METs 2.8 2.8 3.3     Track   Laps 14 14 14      Minutes 10 10 10      METs 3.45 3.45 3.45     Home Exercise Plan   Plans to continue exercise at  [employer] (comment)  [employer] (comment)  [employer]     Frequency  Add 2 additional days to program exercise sessions. Add 2 additional days to program exercise sessions.        Exercise Comments:     Exercise Comments      11/30/15 1446 12/26/15 0911         Exercise Comments Doing well with exercise tolerance, will continue to follow exericse progression Pt is tolerating exercise well and continues to make progress.  Pt has had some issues with blood sugars being too low to exercise. He also missed some sessions due to an upper respiratory infection.         Discharge Exercise Prescription (Final Exercise Prescription Changes):     Exercise Prescription Changes - 12/26/15 0900    Exercise Review   Progression Yes   Response to Exercise   Blood Pressure (Admit) 128/80 mmHg   Blood Pressure (Exercise)  120/66 mmHg   Blood Pressure (Exit) 132/80 mmHg   Heart Rate (Admit) 86 bpm   Heart Rate (Exercise) 126 bpm   Heart Rate (Exit) 86 bpm   Rating of Perceived Exertion (Exercise) 12   Comments Home Exercise Given 12/20/15   Duration Progress to 30 minutes of continuous aerobic without signs/symptoms of physical distress   Intensity THRR unchanged   Progression   Progression Continue to progress workloads to maintain intensity without signs/symptoms of physical distress.   Average METs 3.92   Resistance Training   Training Prescription Yes   Weight 2 lbs   Reps 10-12   Interval Training   Interval Training No   Bike   Level 2.3   Minutes 10   METs 5.03   NuStep   Level 4   Minutes 10   METs 3.3   Track   Laps 14   Minutes 10   METs 3.45   Home Exercise Plan   Plans to continue exercise at [employer] (comment)  [employer]   Frequency Add 2 additional days to program exercise sessions.  Nutrition:  Target Goals: Understanding of nutrition guidelines, daily intake of sodium 1500mg , cholesterol 200mg , calories 30% from fat and 7% or less from saturated fats, daily to have 5 or more servings of fruits and vegetables.  Biometrics:     Pre Biometrics - 11/21/15 1607    Pre Biometrics   Height 5\' 11"  (1.803 m)   Weight 237 lb 14 oz (107.9 kg)   Waist Circumference 44 inches   Hip Circumference 41 inches   Waist to Hip Ratio 1.07 %   BMI (Calculated) 33.2   Triceps Skinfold 15 mm   % Body Fat 31.3 %   Grip Strength 43 kg   Flexibility 12 in   Single Leg Stand 3.65 seconds       Nutrition Therapy Plan and Nutrition Goals:     Nutrition Therapy & Goals - 12/25/15 1322    Nutrition Therapy   Diet Diabetic, Therapeutic Lifestyle Changes   Personal Nutrition Goals   Personal Goal #1 0.5-2 lb wt loss per week to a goal wt loss of 6-24 lb at graduation from [place], educate and counsel regarding individualized  specific dietary modifications aiming towards targeted core components such as weight, hypertension, lipid management, diabetes, heart failure and other comorbidities.;Nutrition handout(s) given to patient.  Handouts given for Nutrition I, Nutrition II, and Diabetes Blitz classes   Expected Outcomes Short Term Goal: Understand basic principles of dietary content, such as calories, fat, sodium, cholesterol and nutrients.;Long Term Goal: Adherence to prescribed nutrition plan.      Nutrition Discharge: Nutrition Scores:   Nutrition Goals Re-Evaluation:   Psychosocial: Target Goals: Acknowledge presence or absence of depression, maximize coping skills, provide positive support system. Participant is able to verbalize types and ability to use techniques and skills needed for reducing stress and depression.  Initial Review & Psychosocial Screening:   Quality of Life Scores:     Quality of Life - 11/22/15 1639    Quality of Life Scores   Health/Function Pre 30 %   Socioeconomic Pre 30 %   Psych/Spiritual Pre 28.29 %   Family Pre 28.5 %   GLOBAL Pre 29.4 %      PHQ-9:     Recent Review Flowsheet Data    Depression screen [facility] 2/9 11/27/2015 07/28/2015 07/14/2014   Decreased Interest 0 0 0   Down, Depressed, Hopeless 0 0 0   PHQ - 2 Score 0 0 0      Psychosocial Evaluation and Intervention:   Psychosocial Re-Evaluation:   Vocational Rehabilitation: Provide vocational rehab assistance to qualifying candidates.   Vocational Rehab Evaluation & Intervention:     Vocational Rehab - 11/27/15 1534    Initial Vocational Rehab Evaluation & Intervention   Assessment shows need for Vocational Rehabilitation (p) No      Education: Education Goals: Education classes will be provided on a weekly basis, covering required topics. Participant will state understanding/return demonstration of topics presented.  Learning Barriers/Preferences:     Learning Barriers/Preferences -  11/21/15 1436    Learning Barriers/Preferences   Learning Barriers Sight  glasses   Learning Preferences Written Material      Education Topics: Count Your Pulse:  -Group instruction provided by verbal instruction, demonstration, patient participation and written materials to support subject.  Instructors address importance of being able to find your pulse and how to count your pulse when at home without a heart monitor.  Patients get hands on experience counting  their pulse with staff help and individually.   Heart Attack, Angina, and Risk Factor Modification:  -Group instruction provided by verbal instruction, video, and written materials to support subject.  Instructors address signs and symptoms of angina and heart attacks.    Also discuss risk factors for heart disease and how to make changes to improve heart health risk factors.   Functional Fitness:  -Group instruction provided by verbal instruction, demonstration, patient participation, and written materials to support subject.  Instructors address safety measures for doing things around the house.  Discuss how to get up and down off the floor, how to pick things up properly, how to safely get out of a chair without assistance, and balance training.   Meditation and Mindfulness:  -Group instruction provided by verbal instruction, patient participation, and written materials to support subject.  Instructor addresses importance of mindfulness and meditation practice to help reduce stress and improve awareness.  Instructor also leads participants through a meditation exercise.    Stretching for Flexibility and Mobility:  -Group instruction provided by verbal instruction, patient participation, and written materials to support subject.  Instructors lead participants through series of stretches that are designed to increase flexibility thus improving mobility.  These stretches are additional exercise for major muscle groups that are  typically performed during regular warm up and cool down.   Hands Only CPR Anytime:  -Group instruction provided by verbal instruction, video, patient participation and written materials to support subject.  Instructors co-teach with AHA video for hands only CPR.  Participants get hands on experience with mannequins.   Nutrition I class: Heart Healthy Eating:  -Group instruction provided by PowerPoint slides, verbal discussion, and written materials to support subject matter. The instructor gives an explanation and review of the Therapeutic Lifestyle Changes diet recommendations, which includes a discussion on lipid goals, dietary fat, sodium, fiber, plant stanol/sterol esters, sugar, and the components of a well-balanced, healthy diet.          CARDIAC REHAB PHASE II EXERCISE from 12/27/2015 in [place]   Date  12/25/15   Educator  RD   Instruction Review Code  Not applicable [class handout given]      Nutrition II class: Lifestyle Skills:  -Group instruction provided by PowerPoint slides, verbal discussion, and written materials to support subject matter. The instructor gives an explanation and review of label reading, grocery shopping for heart health, heart healthy recipe modifications, and ways to make healthier choices when eating out.      CARDIAC REHAB PHASE II EXERCISE from 12/27/2015 in [place]   Date  12/25/15   Educator  RD   Instruction Review Code  Not applicable [class handouts given]      Diabetes Question & Answer:  -Group instruction provided by PowerPoint slides, verbal discussion, and written materials to support subject matter. The instructor gives an explanation and review of diabetes co-morbidities, pre- and post-prandial blood glucose goals, pre-exercise blood glucose goals, signs, symptoms, and treatment of hypoglycemia and hyperglycemia, and foot care basics.   Diabetes Blitz:  -Group  instruction provided by PowerPoint slides, verbal discussion, and written materials to support subject matter. The instructor gives an explanation and review of the physiology behind type 1 and type 2 diabetes, diabetes medications and rational behind using different medications, pre- and post-prandial blood glucose recommendations and Hemoglobin A1c goals, diabetes diet, and exercise including blood glucose guidelines for exercising safely.       CARDIAC  REHAB PHASE II EXERCISE from 12/27/2015 in [place]   Date  12/25/15   Educator  RD   Instruction Review Code  Not applicable [class handout given]      Portion Distortion:  -Group instruction provided by PowerPoint slides, verbal discussion, written materials, and food models to support subject matter. The instructor gives an explanation of serving size versus portion size, changes in portions sizes over the last 20 years, and what consists of a serving from each food group.   Stress Management:  -Group instruction provided by verbal instruction, video, and written materials to support subject matter.  Instructors review role of stress in heart disease and how to cope with stress positively.        CARDIAC REHAB PHASE II EXERCISE from 12/27/2015 in [place]   Date  12/27/15   Instruction Review Code  2- meets goals/outcomes      Exercising on Your Own:  -Group instruction provided by verbal instruction, power point, and written materials to support subject.  Instructors discuss benefits of exercise, components of exercise, frequency and intensity of exercise, and end points for exercise.  Also discuss use of nitroglycerin and activating EMS.  Review options of places to exercise outside of rehab.  Review guidelines for sex with heart disease.   Cardiac Drugs I:  -Group instruction provided by verbal instruction and written materials to support subject.  Instructor reviews  cardiac drug classes: antiplatelets, anticoagulants, beta blockers, and statins.  Instructor discusses reasons, side effects, and lifestyle considerations for each drug class.      CARDIAC REHAB PHASE II EXERCISE from 12/27/2015 in [place]   Date  12/20/15   Educator  Pharm D   Instruction Review Code  2- meets goals/outcomes      Cardiac Drugs II:  -Group instruction provided by verbal instruction and written materials to support subject.  Instructor reviews cardiac drug classes: angiotensin converting enzyme inhibitors (ACE-I), angiotensin II receptor blockers (ARBs), nitrates, and calcium channel blockers.  Instructor discusses reasons, side effects, and lifestyle considerations for each drug class.   Anatomy and Physiology of the Circulatory System:  -Group instruction provided by verbal instruction, video, and written materials to support subject.  Reviews functional anatomy of heart, how it relates to various diagnoses, and what role the heart plays in the overall system.   Knowledge Questionnaire Score:     Knowledge Questionnaire Score - 11/22/15 1639    Knowledge Questionnaire Score   Pre Score 21/24      Core Components/Risk Factors/Patient Goals at Admission:     Personal Goals and Risk Factors at Admission - 11/21/15 1427    Core Components/Risk Factors/Patient Goals on Admission   Diabetes Yes   Intervention Provide education about signs/symptoms and action to take for hypo/hyperglycemia.;Provide education about proper nutrition, including hydration, and aerobic/resistive exercise prescription along with prescribed medications to achieve blood glucose in normal ranges: Fasting glucose 65-99 mg/dL   Expected Outcomes Short Term: Participant verbalizes understanding of the signs/symptoms and immediate care of hyper/hypoglycemia, proper foot care and importance of medication, aerobic/resistive exercise and nutrition plan for blood glucose  control.;Long Term: Attainment of HbA1C < 7%.   Hypertension Yes   Intervention Provide education on lifestyle modifcations including regular physical activity/exercise, weight management, moderate sodium restriction and increased consumption of fresh fruit, vegetables, and low fat dairy, alcohol moderation, and smoking cessation.;Monitor prescription use compliance.   Expected Outcomes Short Term:  Continued assessment and intervention until BP is < 140/46mm HG in hypertensive participants. < 130/49mm HG in hypertensive participants with diabetes, heart failure or chronic kidney disease.;Long Term: Maintenance of blood pressure at goal levels.   Lipids Yes   Intervention Provide education and support for participant on nutrition & aerobic/resistive exercise along with prescribed medications to achieve LDL 70mg , HDL >40mg .   Expected Outcomes Short Term: Participant states understanding of desired cholesterol values and is compliant with medications prescribed. Participant is following exercise prescription and nutrition guidelines.;Long Term: Cholesterol controlled with medications as prescribed, with individualized exercise RX and with personalized nutrition plan. Value goals: LDL < 70mg , HDL > 40 mg.   Personal Goal Other Yes   Personal Goal Learn baseline and limits for exercise, able to mow grass, able to dance with granddaughter at wedding (3 yrs old)   Intervention Provide exercise presciption and education to increase knowledge for lifestyle change   Expected Outcomes Able to return to routine at gym with confidence      Core Components/Risk Factors/Patient Goals Review:      Goals and Risk Factor Review      12/05/15 1635 12/27/15 1205         Core Components/Risk Factors/Patient Goals Review   Personal Goals Review Hypertension;Other Weight Management/Obesity;Increase Strength and Stamina;Sedentary;Hypertension;Diabetes;Other  new weight goal 230 lbs      Review Pt is having blood  pressure checked regularly in program.  He has had some elevated responses to exercise on the bike.  He is following prescription provide in program.  We will discuss home exercise in near futrure. His weight has been steady.  Blood pressures are good.  Blood sugars have been too low for exercise.   He is back to mowing grass.  He did have some stress recently with granddaughter, but she is getting better.      Expected Outcomes Able to return to gym on own Continued weight loss and improve blood sugar control.         Core Components/Risk Factors/Patient Goals at Discharge (Final Review):      Goals and Risk Factor Review - 12/27/15 1205    Core Components/Risk Factors/Patient Goals Review   Personal Goals Review Weight Management/Obesity;Increase Strength and Stamina;Sedentary;Hypertension;Diabetes;Other  new weight goal 230 lbs   Review His weight has been steady.  Blood pressures are good.  Blood sugars have been too low for exercise.   He is back to mowing grass.  He did have some stress recently with granddaughter, but she is getting better.   Expected Outcomes Continued weight loss and improve blood sugar control.      ITP Comments:     ITP Comments      11/22/15 0942           ITP Comments Dr. Radford Pax Medical Director          Comments:  Pt is making expected progress toward personal goals after completing 6 sessions with 3 incomplete sessions due to low pre exercise blood glucose readings.  Pt received counseling regarding medication and meals prior to exercise. Recommend continued exercise and life style modification education including  stress management and relaxation techniques to decrease cardiac risk profile. [name], BSN

## 2016-01-03 ENCOUNTER — Encounter (HOSPITAL_COMMUNITY)
Admission: RE | Admit: 2016-01-03 | Discharge: 2016-01-03 | Disposition: A | Discharge status: Home / Self Care | Payer: Medicare Other | Source: Ambulatory Visit | Attending: Cardiology | Admitting: Cardiology

## 2016-01-03 DIAGNOSIS — I214 Non-ST elevation (NSTEMI) myocardial infarction: Secondary | ICD-10-CM | POA: Diagnosis not present

## 2016-01-03 LAB — GLUCOSE, CAPILLARY: GLUCOSE-CAPILLARY: 112 mg/dL — AB (ref 65–99)

## 2016-01-05 ENCOUNTER — Encounter (HOSPITAL_COMMUNITY): Payer: Medicare Other

## 2016-01-08 ENCOUNTER — Encounter (HOSPITAL_COMMUNITY): Payer: Medicare Other

## 2016-01-10 ENCOUNTER — Encounter (HOSPITAL_COMMUNITY)
Admission: RE | Admit: 2016-01-10 | Discharge: 2016-01-10 | Disposition: A | Discharge status: Home / Self Care | Payer: Medicare Other | Source: Ambulatory Visit | Attending: Cardiology | Admitting: Cardiology

## 2016-01-10 DIAGNOSIS — Z955 Presence of coronary angioplasty implant and graft: Secondary | ICD-10-CM | POA: Insufficient documentation

## 2016-01-10 DIAGNOSIS — I214 Non-ST elevation (NSTEMI) myocardial infarction: Secondary | ICD-10-CM | POA: Insufficient documentation

## 2016-01-12 ENCOUNTER — Encounter (HOSPITAL_COMMUNITY): Payer: Medicare Other

## 2016-01-15 ENCOUNTER — Encounter (HOSPITAL_COMMUNITY): Payer: Medicare Other

## 2016-01-17 ENCOUNTER — Encounter (HOSPITAL_COMMUNITY): Payer: Medicare Other

## 2016-01-18 ENCOUNTER — Ambulatory Visit (INDEPENDENT_AMBULATORY_CARE_PROVIDER_SITE_OTHER): Payer: Medicare Other | Admitting: Internal Medicine

## 2016-01-18 ENCOUNTER — Other Ambulatory Visit (INDEPENDENT_AMBULATORY_CARE_PROVIDER_SITE_OTHER): Payer: Medicare Other | Admitting: *Deleted

## 2016-01-18 ENCOUNTER — Encounter: Payer: Self-pay | Admitting: Internal Medicine

## 2016-01-18 VITALS — BP 118/62 | HR 85 | Temp 98.0°F | Resp 12 | Wt 237.0 lb

## 2016-01-18 DIAGNOSIS — E1122 Type 2 diabetes mellitus with diabetic chronic kidney disease: Secondary | ICD-10-CM

## 2016-01-18 DIAGNOSIS — N182 Chronic kidney disease, stage 2 (mild): Secondary | ICD-10-CM

## 2016-01-18 DIAGNOSIS — E538 Deficiency of other specified B group vitamins: Secondary | ICD-10-CM | POA: Diagnosis not present

## 2016-01-18 LAB — POCT GLYCOSYLATED HEMOGLOBIN (HGB A1C): Hemoglobin A1C: 6

## 2016-01-18 MED ORDER — CYANOCOBALAMIN 1000 MCG/ML IJ SOLN
1000.0000 ug | Freq: Once | INTRAMUSCULAR | Status: AC
  Administered 2016-01-18: 1000 ug via INTRAMUSCULAR

## 2016-01-18 NOTE — Patient Instructions (Addendum)
Patient Instructions  Please continue Metformin 1000 mg 2x a day with meals.  Try to skip Glipizide the [facility] Continue Glipizide 5 mg before dinner.  Please return in 3 months with your sugar log.   Check sugars 1x a day, rotating check times.

## 2016-01-18 NOTE — Progress Notes (Signed)
Patient ID: Charles [name], male   DOB: 02/21/1941, 75 y.o.   MRN: [id number]  HPI: Charles [name] is a 75 y.o.-year-old male, returning for f/u for DM2 dx 2008, non-insulin-dependent, uncontrolled, with complications (CAD - s/p NSTEMI, PN, mild CKD). Last visit 3 mo ago.   He was admitted with NSTEMI 10/18/2015. He had a stent.   He is now in cardiac rehab 3x a week. On Brilinta and Amiodarone.  DM2: Last hemoglobin A1c was: Lab Results  Component Value Date   HGBA1C 6.7* 10/19/2015   HGBA1C 6.3 10/13/2015   HGBA1C 6.4 07/21/2015  12/2013: 8.6%  Pt is on a regimen of: - Metformin 1000 mg po bid - Glipizide 5 mg bid  Pt checks his sugars once in am - 90 day ave: 115: - am: 110-140, occasionally 180 >> 86-135 >> 68-102, 124x1 >> 61, 75-120, 148 >>  104-140 >> 85-125 - 2h after b'fast: 106-187 >> 78-116 >> n/c - before lunch: 79-90, 157 >> n/c - 2h after lunch: 123-176 >> n/c - before dinner: 106, 125 >> n/c - 2h after dinner: 173-197 >> n/c No lows. Lowest sugar was 61 >> 52 (took Glipizide late after a meal); he has hypoglycemia awareness in the 60s. Highest sugar was 160 >> 195 (bowl of cereals).  - has mild CKD, last BUN/creatinine:  Lab Results  Component Value Date   BUN 23* 10/22/2015   CREATININE 1.49* 10/22/2015  of note, he had a left nephrectomy. He is on lisinopril 40. ACR in 03/2013: 5.3.  - He has HTG; last set of lipids: Lab Results  Component Value Date   CHOL 175 10/19/2015   HDL 52 10/19/2015   LDLCALC 107* 10/19/2015   LDLDIRECT 139.8 07/08/2014   TRIG 79 10/19/2015   CHOLHDL 3.4 10/19/2015   - last eye exam was in 10/2015 - Dr Delman Cheadle. No DR.  - + numbness and tingling in his feet >> improved.  At last visit, we diagnosed B12 deficiency: Component     Latest Ref Rng 10/13/2015  Vitamin B12     211 - 911 pg/mL 94 (L)  He started B12 im  - he will get the 4th injection today. He feels much better.   ROS: Constitutional: no weight gain/loss, no  fatigue, no subjective hyperthermia/hypothermia, no nocturia Eyes: no blurry vision, no xerophthalmia ENT: no sore throat, no nodules palpated in throat, no dysphagia/odynophagia, no hoarseness Cardiovascular: no CP/SOB/palpitations/+ leg swelling (periankle) Respiratory: no cough/SOB Gastrointestinal: no N/V/D/C Musculoskeletal: no muscle/joint aches Skin: + rash - stasis dermatitis R leg, no easy bruising Neurological: no tremors/+ feet numbness/no tingling/dizziness  I reviewed pt's medications, allergies, PMH, social hx, family hx, and changes were documented in the history of present illness. Otherwise, unchanged from my initial visit note.  PE: BP 118/62 mmHg  Pulse 85  Temp(Src) 98 F (36.7 C) (Oral)  Resp 12  Wt 237 lb (107.502 kg)  SpO2 96% Wt Readings from Last 3 Encounters:  01/18/16 237 lb (107.502 kg)  11/21/15 237 lb 14 oz (107.9 kg)  10/26/15 238 lb 1.6 oz (108.001 kg)   Constitutional: overweight, in NAD Eyes: PERRLA, EOMI, no exophthalmos ENT: moist mucous membranes, no thyromegaly, no cervical lymphadenopathy Cardiovascular: tachycardia, RR, No MRG, + periankle edema  - mild - R>L Respiratory: CTA B Gastrointestinal: abdomen soft, NT, ND, BS+ Musculoskeletal: no deformities, strength intact in all 4 Skin: moist, warm, no rashes Neurological: no tremor with outstretched hands, DTR normal in all 4  ASSESSMENT:  1. DM2, non-insulin-dependent, now controlled, with complications - CAD - s/p NSTEMI (Dr. Terrence Dupont) - peripheral neuropathy - CKD  2. B12 def  PLAN:  1. Patient with long-standing, now controlled diabetes, on oral antidiabetic regimen. Sugars mostly at goal, but he only checks sugars in am despite repeated advice to also check later. He does feel a low CBG sometimes after exercise >> will hold Glipizide in am in the days he goes to the gym.  Patient Instructions  Please continue Metformin 1000 mg 2x a day with meals.  Try to skip Glipizide the [facility] Continue Glipizide 5 mg before dinner.  Please return in 3 months with your sugar log.   Check sugars 1x a day, rotating check times.  - checked HbA1c today >> 6.0% (great) - UTD with eye exams - Return to clinic in 3 months with sugar log   2. PN - since he is on Metformin, we checked a B12 vitamin at last visit >> very low - we started B12 injections - had 4 >> will repeat a B12 level at next visit and then start po B12

## 2016-01-19 ENCOUNTER — Encounter (HOSPITAL_COMMUNITY): Payer: Medicare Other

## 2016-01-22 ENCOUNTER — Encounter (HOSPITAL_COMMUNITY): Payer: Medicare Other

## 2016-01-24 ENCOUNTER — Encounter (HOSPITAL_COMMUNITY): Payer: Medicare Other

## 2016-01-26 ENCOUNTER — Encounter (HOSPITAL_COMMUNITY): Payer: Medicare Other

## 2016-01-29 ENCOUNTER — Encounter (HOSPITAL_COMMUNITY): Payer: Medicare Other

## 2016-01-31 ENCOUNTER — Encounter (HOSPITAL_COMMUNITY): Payer: Medicare Other

## 2016-02-01 ENCOUNTER — Ambulatory Visit (HOSPITAL_COMMUNITY): Payer: Self-pay | Admitting: *Deleted

## 2016-02-01 NOTE — Progress Notes (Signed)
Cardiac Individual Treatment Plan  Patient Details  Name: Charles [name] MRN: [id number] Date of Birth: [birth year] Referring Provider:        CARDIAC REHAB PHASE II EXERCISE from 12/20/2015 in [place]   Referring Provider  Charolette Forward MD      Initial Encounter Date:       CARDIAC REHAB PHASE II EXERCISE from 12/20/2015 in [place]   Date  11/21/15   Referring Provider  Charolette Forward MD      Visit Diagnosis: No diagnosis found.  Patient's Home Medications on Admission:  Current outpatient prescriptions:  .  amiodarone (PACERONE) 200 MG tablet, Take 1 tablet (200 mg total) by mouth daily., Disp: 30 tablet, Rfl: 1 .  aspirin 81 MG chewable tablet, Chew 1 tablet (81 mg total) by mouth daily., Disp: , Rfl:  .  atorvastatin (LIPITOR) 40 MG tablet, Take 1 tablet (40 mg total) by mouth daily at 6 PM., Disp: 30 tablet, Rfl: 3 .  Cyanocobalamin (VITAMIN B-12 IJ), Inject 1 mL as directed every 30 (thirty) days., Disp: , Rfl:  .  EPINEPHrine (EPIPEN 2-PAK) 0.3 mg/0.3 mL DEVI, Inject 0.3 mg into the muscle once.  , Disp: , Rfl:  .  glipiZIDE (GLUCOTROL) 10 MG tablet, Take one-half tablet by  mouth two times daily  before meals, Disp: 90 tablet, Rfl: 1 .  lisinopril (PRINIVIL,ZESTRIL) 5 MG tablet, Take 1 tablet (5 mg total) by mouth daily., Disp: 30 tablet, Rfl: 3 .  metFORMIN (GLUCOPHAGE) 1000 MG tablet, Take 1 tablet by mouth two  times daily with meals, Disp: 180 tablet, Rfl: 0 .  Multiple Vitamins-Minerals (ICAPS PO), Take 1 tablet by mouth daily., Disp: , Rfl:  .  nitroGLYCERIN (NITROSTAT) 0.4 MG SL tablet, Place 1 tablet (0.4 mg total) under the tongue every 5 (five) minutes as needed for chest pain., Disp: 25 tablet, Rfl: 1 .  ONETOUCH VERIO test strip, USE ONE STRIP TO CHECK GLUCOSE ONCE DAILY, Disp: 100 each, Rfl: 3 .  ticagrelor (BRILINTA) 90 MG TABS tablet, Take 1 tablet (90 mg total) by mouth 2 (two) times daily.,  Disp: 60 tablet, Rfl: 3  Past Medical History: Past Medical History  Diagnosis Date  . Hyperlipidemia   . Obesity   . Hypertension   . Eczema   . Biceps muscle tear     right  . Hernia, ventral   . AVM (arteriovenous malformation) of colon     2 - non-bleeding 2013  . Personal history of adenomatous colonic polyps 07/20/2012    3 + adenomas 2009 07/20/2012 2 diminutive polyps    . Coronary artery disease   . Pneumonia 1942; 1950s X 1  . Walking pneumonia 2000's X 1  . Type II diabetes mellitus ([place])   . Anemia     "when I was a lad"  . GERD (gastroesophageal reflux disease)   . Arthritis     "right femur" (10/19/2015)  . Cancer of kidney ([place]) 2000    left nephrectomy    Tobacco Use: History  Smoking status  . Former Smoker -- 20 years  . Types: Cigarettes, Pipe, Cigars  . Quit date: 07/06/2012  Smokeless tobacco  . Never Used    Labs: Recent Review Flowsheet Data    Labs for ITP Cardiac and Pulmonary Rehab Latest Ref Rng 07/14/2015 07/21/2015 10/13/2015 10/19/2015 01/18/2016   Cholestrol 0 - 200 mg/dL - 186 - 175 -   LDLCALC 0 -  99 mg/dL - 106(H) - 107(H) -   HDL >40 mg/dL - 44.10 - 52 -   Trlycerides <150 mg/dL - 182.0(H) - 79 -   Hemoglobin A1c - 6.0 6.4 6.3 6.7(H) 6.0      Capillary Blood Glucose: Lab Results  Component Value Date   GLUCAP 112* 01/03/2016   GLUCAP 94 01/01/2016   GLUCAP 123* 01/01/2016   GLUCAP 152* 12/27/2015   GLUCAP 83 12/27/2015     Exercise Target Goals:    Exercise Program Goal: Individual exercise prescription set with THRR, safety & activity barriers. Participant demonstrates ability to understand and report RPE using BORG scale, to self-measure pulse accurately, and to acknowledge the importance of the exercise prescription.  Exercise Prescription Goal: Starting with aerobic activity 30 plus minutes a day, 3 days per week for initial exercise prescription. Provide home exercise prescription and guidelines that participant  acknowledges understanding prior to discharge.  Activity Barriers & Risk Stratification:   6 Minute Walk:   Initial Exercise Prescription:     Initial Exercise Prescription - 12/21/15 1400    Date of Initial Exercise RX and Referring Provider   Date 11/21/15   Referring Provider Charolette Forward MD   Bike   Level 2.3   Minutes 10   METs 5.03   NuStep   Level 3   Minutes 10   METs 2.8   Track   Laps 14   Minutes 10   METs 3.45   Resistance Training   Training Prescription Yes   Weight 2 lbs   Reps 10-12      Perform Capillary Blood Glucose checks as needed.  Exercise Prescription Changes:     Exercise Prescription Changes      12/20/15 1200 12/26/15 0900 01/09/16 1000       Exercise Review   Progression Yes Yes Yes     Response to Exercise   Blood Pressure (Admit) 112/64 mmHg 128/80 mmHg 130/80 mmHg     Blood Pressure (Exercise) 132/70 mmHg 120/66 mmHg 156/80 mmHg     Blood Pressure (Exit) 120/64 mmHg 132/80 mmHg 110/76 mmHg     Heart Rate (Admit) 76 bpm 86 bpm 83 bpm     Heart Rate (Exercise) 123 bpm 126 bpm 126 bpm     Heart Rate (Exit) 83 bpm 86 bpm 83 bpm     Rating of Perceived Exertion (Exercise) 12 12 13      Comments Home Exercise Given 12/20/15 Home Exercise Given 12/20/15 Home Exercise Given 12/20/15     Duration Progress to 30 minutes of continuous aerobic without signs/symptoms of physical distress Progress to 30 minutes of continuous aerobic without signs/symptoms of physical distress Progress to 30 minutes of continuous aerobic without signs/symptoms of physical distress     Intensity THRR unchanged THRR unchanged THRR unchanged     Progression   Progression Continue to progress workloads to maintain intensity without signs/symptoms of physical distress. Continue to progress workloads to maintain intensity without signs/symptoms of physical distress. Continue to progress workloads to maintain intensity without signs/symptoms of physical distress.      Average METs 3.98 3.92 3.9     Resistance Training   Training Prescription Yes Yes Yes     Weight 2 lbs 2 lbs 2 lbs     Reps 10-12 10-12 10-12     Interval Training   Interval Training No No No     Bike   Level 2.3 2.3 2.3     Minutes 10 10 10  METs 5.03 5.03 5.02     NuStep   Level 3 4 4      Minutes 10 10 10      METs 2.8 3.3 3.2     Track   Laps 14 14 14      Minutes 10 10 10      METs 3.45 3.45 3.45     Home Exercise Plan   Plans to continue exercise at [employer] (comment)  [place] (comment)  [place] (comment)  [employer]     Frequency Add 2 additional days to program exercise sessions. Add 2 additional days to program exercise sessions. Add 2 additional days to program exercise sessions.        Exercise Comments:     Exercise Comments      12/26/15 0911 01/09/16 1013         Exercise Comments Pt is tolerating exercise well and continues to make progress.  Pt has had some issues with blood sugars being too low to exercise. He also missed some sessions due to an upper respiratory infection. Pt has been out of town visiting family for two weeks.  He planned to walk on his own while he was out of town.         Discharge Exercise Prescription (Final Exercise Prescription Changes):     Exercise Prescription Changes - 01/09/16 1000    Exercise Review   Progression Yes   Response to Exercise   Blood Pressure (Admit) 130/80 mmHg   Blood Pressure (Exercise) 156/80 mmHg   Blood Pressure (Exit) 110/76 mmHg   Heart Rate (Admit) 83 bpm   Heart Rate (Exercise) 126 bpm   Heart Rate (Exit) 83 bpm   Rating of Perceived Exertion (Exercise) 13   Comments Home Exercise Given 12/20/15   Duration Progress to 30 minutes of continuous aerobic without signs/symptoms of physical distress   Intensity THRR unchanged   Progression   Progression Continue to progress workloads to maintain intensity without signs/symptoms of physical distress.   Average  METs 3.9   Resistance Training   Training Prescription Yes   Weight 2 lbs   Reps 10-12   Interval Training   Interval Training No   Bike   Level 2.3   Minutes 10   METs 5.02   NuStep   Level 4   Minutes 10   METs 3.2   Track   Laps 14   Minutes 10   METs 3.45   Home Exercise Plan   Plans to continue exercise at [employer] (comment)  [employer]   Frequency Add 2 additional days to program exercise sessions.      Nutrition:  Target Goals: Understanding of nutrition guidelines, daily intake of sodium 1500mg , cholesterol 200mg , calories 30% from fat and 7% or less from saturated fats, daily to have 5 or more servings of fruits and vegetables.  Biometrics:    Nutrition Therapy Plan and Nutrition Goals:     Nutrition Therapy & Goals - 12/25/15 1322    Nutrition Therapy   Diet Diabetic, Therapeutic Lifestyle Changes   Personal Nutrition Goals   Personal Goal #1 0.5-2 lb wt loss per week to a goal wt loss of 6-24 lb at graduation from [place], educate and counsel regarding individualized specific dietary modifications aiming towards targeted core components such as weight, hypertension, lipid management, diabetes, heart failure and other comorbidities.;Nutrition handout(s) given to patient.  Handouts given for Nutrition I, Nutrition II,  and Diabetes Blitz classes   Expected Outcomes Short Term Goal: Understand basic principles of dietary content, such as calories, fat, sodium, cholesterol and nutrients.;Long Term Goal: Adherence to prescribed nutrition plan.      Nutrition Discharge: Nutrition Scores:   Nutrition Goals Re-Evaluation:   Psychosocial: Target Goals: Acknowledge presence or absence of depression, maximize coping skills, provide positive support system. Participant is able to verbalize types and ability to use techniques and skills needed for reducing stress and depression.  Initial Review &  Psychosocial Screening:   Quality of Life Scores:   PHQ-9:     Recent Review Flowsheet Data    Depression screen [facility] 2/9 11/27/2015 07/28/2015 07/14/2014   Decreased Interest 0 0 0   Down, Depressed, Hopeless 0 0 0   PHQ - 2 Score 0 0 0      Psychosocial Evaluation and Intervention:   Psychosocial Re-Evaluation:   Vocational Rehabilitation: Provide vocational rehab assistance to qualifying candidates.   Vocational Rehab Evaluation & Intervention:   Education: Education Goals: Education classes will be provided on a weekly basis, covering required topics. Participant will state understanding/return demonstration of topics presented.  Learning Barriers/Preferences:   Education Topics: Count Your Pulse:  -Group instruction provided by verbal instruction, demonstration, patient participation and written materials to support subject.  Instructors address importance of being able to find your pulse and how to count your pulse when at home without a heart monitor.  Patients get hands on experience counting their pulse with staff help and individually.   Heart Attack, Angina, and Risk Factor Modification:  -Group instruction provided by verbal instruction, video, and written materials to support subject.  Instructors address signs and symptoms of angina and heart attacks.    Also discuss risk factors for heart disease and how to make changes to improve heart health risk factors.   Functional Fitness:  -Group instruction provided by verbal instruction, demonstration, patient participation, and written materials to support subject.  Instructors address safety measures for doing things around the house.  Discuss how to get up and down off the floor, how to pick things up properly, how to safely get out of a chair without assistance, and balance training.   Meditation and Mindfulness:  -Group instruction provided by verbal instruction, patient participation, and written materials  to support subject.  Instructor addresses importance of mindfulness and meditation practice to help reduce stress and improve awareness.  Instructor also leads participants through a meditation exercise.    Stretching for Flexibility and Mobility:  -Group instruction provided by verbal instruction, patient participation, and written materials to support subject.  Instructors lead participants through series of stretches that are designed to increase flexibility thus improving mobility.  These stretches are additional exercise for major muscle groups that are typically performed during regular warm up and cool down.   Hands Only CPR Anytime:  -Group instruction provided by verbal instruction, video, patient participation and written materials to support subject.  Instructors co-teach with [facility]HA video for hands only CPR.  Participants get hands on experience with mannequins.   Nutrition I class: Heart Healthy Eating:  -Group instruction provided by PowerPoint slides, verbal discussion, and written materials to support subject matter. The instructor gives an explanation and review of the Therapeutic Lifestyle Changes diet recommendations, which includes a discussion on lipid goals, dietary fat, sodium, fiber, plant stanol/sterol esters, sugar, and the components of a well-balanced, healthy diet.      CARDIAC REHAB PHASE II EXERCISE from 12/27/2015 in [place]  [place]   Date  12/25/15   Educator  RD   Instruction Review Code  Not applicable [class handout given]      Nutrition II class: Lifestyle Skills:  -Group instruction provided by PowerPoint slides, verbal discussion, and written materials to support subject matter. The instructor gives an explanation and review of label reading, grocery shopping for heart health, heart healthy recipe modifications, and ways to make healthier choices when eating out.      CARDIAC REHAB PHASE II EXERCISE from 12/27/2015 in [place]   Date  12/25/15   Educator  RD   Instruction Review Code  Not applicable [class handouts given]      Diabetes Question & Answer:  -Group instruction provided by PowerPoint slides, verbal discussion, and written materials to support subject matter. The instructor gives an explanation and review of diabetes co-morbidities, pre- and post-prandial blood glucose goals, pre-exercise blood glucose goals, signs, symptoms, and treatment of hypoglycemia and hyperglycemia, and foot care basics.   Diabetes Blitz:  -Group instruction provided by PowerPoint slides, verbal discussion, and written materials to support subject matter. The instructor gives an explanation and review of the physiology behind type 1 and type 2 diabetes, diabetes medications and rational behind using different medications, pre- and post-prandial blood glucose recommendations and Hemoglobin A1c goals, diabetes diet, and exercise including blood glucose guidelines for exercising safely.       CARDIAC REHAB PHASE II EXERCISE from 12/27/2015 in [place]   Date  12/25/15   Educator  RD   Instruction Review Code  Not applicable [class handout given]      Portion Distortion:  -Group instruction provided by PowerPoint slides, verbal discussion, written materials, and food models to support subject matter. The instructor gives an explanation of serving size versus portion size, changes in portions sizes over the last 20 years, and what consists of a serving from each food group.   Stress Management:  -Group instruction provided by verbal instruction, video, and written materials to support subject matter.  Instructors review role of stress in heart disease and how to cope with stress positively.        CARDIAC REHAB PHASE II EXERCISE from 12/27/2015 in [place]   Date  12/27/15   Instruction Review Code  2- meets goals/outcomes       Exercising on Your Own:  -Group instruction provided by verbal instruction, power point, and written materials to support subject.  Instructors discuss benefits of exercise, components of exercise, frequency and intensity of exercise, and end points for exercise.  Also discuss use of nitroglycerin and activating EMS.  Review options of places to exercise outside of rehab.  Review guidelines for sex with heart disease.   Cardiac Drugs I:  -Group instruction provided by verbal instruction and written materials to support subject.  Instructor reviews cardiac drug classes: antiplatelets, anticoagulants, beta blockers, and statins.  Instructor discusses reasons, side effects, and lifestyle considerations for each drug class.      CARDIAC REHAB PHASE II EXERCISE from 12/27/2015 in [place]   Date  12/20/15   Educator  Pharm D   Instruction Review Code  2- meets goals/outcomes      Cardiac Drugs II:  -Group instruction provided by verbal instruction and written materials to support subject.  Instructor reviews cardiac drug classes: angiotensin converting enzyme inhibitors (ACE-I), angiotensin II receptor blockers (ARBs), nitrates,  and calcium channel blockers.  Instructor discusses reasons, side effects, and lifestyle considerations for each drug class.   Anatomy and Physiology of the Circulatory System:  -Group instruction provided by verbal instruction, video, and written materials to support subject.  Reviews functional anatomy of heart, how it relates to various diagnoses, and what role the heart plays in the overall system.   Knowledge Questionnaire Score:   Core Components/Risk Factors/Patient Goals at Admission:   Core Components/Risk Factors/Patient Goals Review:      Goals and Risk Factor Review      12/27/15 1205           Core Components/Risk Factors/Patient Goals Review   Personal Goals Review Weight Management/Obesity;Increase Strength  and Stamina;Sedentary;Hypertension;Diabetes;Other  new weight goal 230 lbs       Review His weight has been steady.  Blood pressures are good.  Blood sugars have been too low for exercise.   He is back to mowing grass.  He did have some stress recently with granddaughter, but she is getting better.       Expected Outcomes Continued weight loss and improve blood sugar control.          Core Components/Risk Factors/Patient Goals at Discharge (Final Review):      Goals and Risk Factor Review - 12/27/15 1205    Core Components/Risk Factors/Patient Goals Review   Personal Goals Review Weight Management/Obesity;Increase Strength and Stamina;Sedentary;Hypertension;Diabetes;Other  new weight goal 230 lbs   Review His weight has been steady.  Blood pressures are good.  Blood sugars have been too low for exercise.   He is back to mowing grass.  He did have some stress recently with granddaughter, but she is getting better.   Expected Outcomes Continued weight loss and improve blood sugar control.      ITP Comments:   Comments: Pt is making expected progress toward personal goals after completing  6 sessions. Pt presently absent due to out of state travel visiting grand daughter who is medically ill.  Pt last exercise session was on 01/03/16. Recommend continued exercise and life style modification education including  stress management and relaxation techniques to decrease cardiac risk profile. [name], BSN

## 2016-02-02 ENCOUNTER — Encounter (HOSPITAL_COMMUNITY): Admission: RE | Admit: 2016-02-02 | Payer: Medicare Other | Source: Ambulatory Visit

## 2016-02-05 ENCOUNTER — Encounter (HOSPITAL_COMMUNITY): Payer: Medicare Other

## 2016-02-06 ENCOUNTER — Ambulatory Visit (INDEPENDENT_AMBULATORY_CARE_PROVIDER_SITE_OTHER): Payer: Medicare Other | Admitting: Adult Health

## 2016-02-06 VITALS — BP 124/64 | Temp 98.0°F | Wt 243.1 lb

## 2016-02-06 DIAGNOSIS — W57XXXA Bitten or stung by nonvenomous insect and other nonvenomous arthropods, initial encounter: Secondary | ICD-10-CM | POA: Diagnosis not present

## 2016-02-06 DIAGNOSIS — T148 Other injury of unspecified body region: Secondary | ICD-10-CM | POA: Diagnosis not present

## 2016-02-06 MED ORDER — DOXYCYCLINE HYCLATE 100 MG PO CAPS: 100.0000 mg | ORAL_CAPSULE | Freq: Two times a day (BID) | ORAL | Status: DC

## 2016-02-06 NOTE — Patient Instructions (Addendum)
It was great seeing you again.   I have sent in a prescription for  Doxycycline, take this twice a day for 5 days.   Be mindful of signs and symptoms of tick borne illness.    Tick Bite Information Ticks are insects that attach themselves to the skin and draw blood for food. There are various types of ticks. Common types include wood ticks and deer ticks. Most ticks live in shrubs and grassy areas. Ticks can climb onto your body when you make contact with leaves or grass where the tick is waiting. The most common places on the body for ticks to attach themselves are the scalp, neck, armpits, waist, and groin. Most tick bites are harmless, but sometimes ticks carry germs that cause diseases. These germs can be spread to a person during the tick's feeding process. The chance of a disease spreading through a tick bite depends on:   The type of tick.  Time of year.   How long the tick is attached.   Geographic location.  HOW CAN YOU PREVENT TICK BITES? Take these steps to help prevent tick bites when you are outdoors:  Wear protective clothing. Long sleeves and long pants are best.   Wear white clothes so you can see ticks more easily.  Tuck your pant legs into your socks.   If walking on a trail, stay in the middle of the trail to avoid brushing against bushes.  Avoid walking through areas with long grass.  Put insect repellent on all exposed skin and along boot tops, pant legs, and sleeve cuffs.   Check clothing, hair, and skin repeatedly and before going inside.   Brush off any ticks that are not attached.  Take a shower or bath as soon as possible after being outdoors.  WHAT IS THE PROPER WAY TO REMOVE A TICK? Ticks should be removed as soon as possible to help prevent diseases caused by tick bites. 1. If latex gloves are available, put them on before trying to remove a tick.  2. Using fine-point tweezers, grasp the tick as close to the skin as possible. You may  also use curved forceps or a tick removal tool. Grasp the tick as close to its head as possible. Avoid grasping the tick on its body. 3. Pull gently with steady upward pressure until the tick lets go. Do not twist the tick or jerk it suddenly. This may break off the tick's head or mouth parts. 4. Do not squeeze or crush the tick's body. This could force disease-carrying fluids from the tick into your body.  5. After the tick is removed, wash the bite area and your hands with soap and water or other disinfectant such as alcohol. 6. Apply a small amount of antiseptic cream or ointment to the bite site.  7. Wash and disinfect any instruments that were used.  Do not try to remove a tick by applying a hot match, petroleum jelly, or fingernail polish to the tick. These methods do not work and may increase the chances of disease being spread from the tick bite.  WHEN SHOULD YOU SEEK MEDICAL CARE? Contact your health care provider if you are unable to remove a tick from your skin or if a part of the tick breaks off and is stuck in the skin.  After a tick bite, you need to be aware of signs and symptoms that could be related to diseases spread by ticks. Contact your health care provider if you develop any  of the following in the days or weeks after the tick bite:  Unexplained fever.  Rash. A circular rash that appears days or weeks after the tick bite may indicate the possibility of Lyme disease. The rash may resemble a target with a bull's-eye and may occur at a different part of your body than the tick bite.  Redness and swelling in the area of the tick bite.   Tender, swollen lymph glands.   Diarrhea.   Weight loss.   Cough.   Fatigue.   Muscle, joint, or bone pain.   Abdominal pain.   Headache.   Lethargy or a change in your level of consciousness.  Difficulty walking or moving your legs.   Numbness in the legs.   Paralysis.  Shortness of breath.   Confusion.    Repeated vomiting.    This information is not intended to replace advice given to you by your health care provider. Make sure you discuss any questions you have with your health care provider.   Document Released: 08/23/2000 Document Revised: 09/16/2014 Document Reviewed: 02/03/2013 Elsevier Interactive Patient Education [employer].

## 2016-02-06 NOTE — Progress Notes (Signed)
Subjective:    Patient ID: Charles [name], male    DOB: December 13, 1940, 75 y.o.   MRN: [id number]  HPI  75 year old male who presents to the office today for a tick bite. He reports that over the weekend he noticed a tick on his left flank. He reports that the tick was on him for about 24 hours. He was able to remove the tick whole. He denies any fevers, nausea, vomiting, generalized rash or joint/muscle aches.   He does endorse itching and a generalized red rash around the area.     Review of Systems  Constitutional: Negative.   Genitourinary: Negative.   Musculoskeletal: Negative.   Skin: Positive for color change and rash. Negative for pallor and wound.  Hematological: Negative.    Past Medical History  Diagnosis Date  . Hyperlipidemia   . Obesity   . Hypertension   . Eczema   . Biceps muscle tear     right  . Hernia, ventral   . AVM (arteriovenous malformation) of colon     2 - non-bleeding 2013  . Personal history of adenomatous colonic polyps 07/20/2012    3 + adenomas 2009 07/20/2012 2 diminutive polyps    . Coronary artery disease   . Pneumonia 1942; 1950s X 1  . Walking pneumonia 2000's X 1  . Type II diabetes mellitus ([place])   . Anemia     "when I was a lad"  . GERD (gastroesophageal reflux disease)   . Arthritis     "right femur" (10/19/2015)  . 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 Marital Status: Widowed    Spouse Name: N/A  . Number of Children: N/A  . Years of Education: N/A   Occupational History  . Not on file.   Social History Main Topics  . Smoking status: Former Smoker -- 20 years    Types: Cigarettes, Pipe, Cigars    Quit date: 07/06/2012  . Smokeless tobacco: Never Used  . Alcohol Use: 0.6 oz/week    1 Cans of beer per week  . Drug Use: No  . Sexual Activity: No   Other Topics Concern  . 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    Not married- widowed    Three children, One in [place], one in [place] one in [place]    Past Surgical History  Procedure Laterality Date  . Inguinal hernia repair Left >3 times  . Nephrectomy Left 2000  . Femur im nail Right 07/26/2013    Procedure: INTRAMEDULLARY (IM) NAIL FEMORAL subtrochanteric;  Surgeon: Mauri Pole, MD;  Location: [facility];  Service: Orthopedics;  Laterality: Right;  . Cardiac catheterization N/A 10/19/2015    Procedure: Left Heart Cath and Coronary Angiography;  Surgeon: Charolette Forward, MD;  Location: [facility];  Service: Cardiovascular;  Laterality: N/A;  . Cardiac catheterization N/A 10/19/2015    Procedure: Coronary Stent Intervention;  Surgeon: Charolette Forward, MD;  Location: [facility];  Service: Cardiovascular;  Laterality: N/A;  . Inguinal hernia repair Right 2012  . Tonsillectomy  1940s  . Fracture surgery    . Coronary angioplasty    . Colonoscopy w/ biopsies and polypectomy  X 1  . Colonoscopy      "nothing  showed up this time"    Family History  Problem Relation Age of Onset  . Drug abuse Other   . Cancer Other   . Heart disease Other   . Lung disease Other   . Colon cancer Neg Hx   . Stomach cancer Neg Hx   . Diabetes Mother   . Diabetes Sister     No Known Allergies  Current Outpatient Prescriptions on File Prior to Visit  Medication Sig Dispense Refill  . amiodarone (PACERONE) 200 MG tablet Take 1 tablet (200 mg total) by mouth daily. 30 tablet 1  . aspirin 81 MG chewable tablet Chew 1 tablet (81 mg total) by mouth daily.    Marland Kitchen atorvastatin (LIPITOR) 40 MG tablet Take 1 tablet (40 mg total) by mouth daily at 6 PM. 30 tablet 3  . Cyanocobalamin (VITAMIN B-12 IJ) Inject 1 mL as directed every 30 (thirty) days.    Marland Kitchen EPINEPHrine (EPIPEN 2-PAK) 0.3 mg/0.3 mL DEVI Inject 0.3 mg into the muscle once.      Marland Kitchen glipiZIDE (GLUCOTROL) 10 MG tablet Take one-half  tablet by  mouth two times daily  before meals 90 tablet 1  . lisinopril (PRINIVIL,ZESTRIL) 5 MG tablet Take 1 tablet (5 mg total) by mouth daily. 30 tablet 3  . metFORMIN (GLUCOPHAGE) 1000 MG tablet Take 1 tablet by mouth two  times daily with meals 180 tablet 0  . Multiple Vitamins-Minerals (ICAPS PO) Take 1 tablet by mouth daily.    . nitroGLYCERIN (NITROSTAT) 0.4 MG SL tablet Place 1 tablet (0.4 mg total) under the tongue every 5 (five) minutes as needed for chest pain. 25 tablet 1  . ONETOUCH VERIO test strip USE ONE STRIP TO CHECK GLUCOSE ONCE DAILY 100 each 3  . ticagrelor (BRILINTA) 90 MG TABS tablet Take 1 tablet (90 mg total) by mouth 2 (two) times daily. 60 tablet 3   No current facility-administered medications on file prior to visit.    BP 124/64 mmHg  Temp(Src) 98 F (36.7 C) (Oral)  Wt 243 lb 1.6 oz (110.269 kg)        Objective:   Physical Exam  Constitutional: He is oriented to person, place, and time. He appears well-developed and well-nourished. No distress.  Cardiovascular: Normal rate, regular rhythm, normal heart sounds and intact distal pulses.  Exam reveals no gallop and no friction rub.   No murmur heard. Pulmonary/Chest: Effort normal and breath sounds normal. No respiratory distress. He has no wheezes. He has no rales. He exhibits no tenderness.  Neurological: He is alert and oriented to person, place, and time.  Skin: Skin is warm and dry. Rash noted. He is not diaphoretic. No erythema. No pallor.  Red rash to left flank. No erythema migrans. No abscess noted  Psychiatric: He has a normal mood and affect. His behavior is normal. Judgment and thought content normal.  Nursing note and vitals reviewed.     Assessment & Plan:  1. Tick bite - doxycycline (VIBRAMYCIN) 100 MG capsule; Take 1 capsule (100 mg total) by mouth 2 (two) times daily.  Dispense: 10 capsule; Refill: 0 - Information given on tick borne illness and he was advised to follow up if he had  any.   [name], NP

## 2016-02-07 ENCOUNTER — Encounter (HOSPITAL_COMMUNITY): Payer: Medicare Other

## 2016-02-07 ENCOUNTER — Telehealth: Payer: Self-pay | Admitting: Internal Medicine

## 2016-02-07 NOTE — Telephone Encounter (Signed)
Saprenia from [facility] stated she need a patient last office notes, A1C and B/P [phone]

## 2016-02-07 NOTE — Telephone Encounter (Signed)
Faxed to [place] at [facility].

## 2016-02-09 ENCOUNTER — Encounter (HOSPITAL_COMMUNITY): Payer: Medicare Other

## 2016-02-12 ENCOUNTER — Encounter (HOSPITAL_COMMUNITY): Payer: Medicare Other

## 2016-02-14 ENCOUNTER — Other Ambulatory Visit (INDEPENDENT_AMBULATORY_CARE_PROVIDER_SITE_OTHER): Payer: Medicare Other | Admitting: *Deleted

## 2016-02-14 ENCOUNTER — Telehealth: Payer: Self-pay | Admitting: *Deleted

## 2016-02-14 ENCOUNTER — Encounter (HOSPITAL_COMMUNITY): Payer: Medicare Other

## 2016-02-14 ENCOUNTER — Ambulatory Visit (INDEPENDENT_AMBULATORY_CARE_PROVIDER_SITE_OTHER): Payer: Medicare Other | Admitting: *Deleted

## 2016-02-14 DIAGNOSIS — E538 Deficiency of other specified B group vitamins: Secondary | ICD-10-CM

## 2016-02-14 MED ORDER — CYANOCOBALAMIN 1000 MCG/ML IJ SOLN
1000.0000 ug | Freq: Once | INTRAMUSCULAR | Status: AC
  Administered 2016-02-14: 1000 ug via INTRAMUSCULAR

## 2016-02-14 NOTE — Telephone Encounter (Signed)
Called pt and lvm advising him per Dr Arman Filter message. Advised him to call back if he needs an rx for 5 mg of Glipizide to be called in.

## 2016-02-14 NOTE — Telephone Encounter (Signed)
Please advise him to start taking only 2.5 mg of glipizide before dinner. If sugars at bedtime lower than 150 after the change, he can stop that glipizide completely and let me know about the sugars in ~2 weeks.

## 2016-02-14 NOTE — Telephone Encounter (Signed)
Pt came in for his B12 inj and advised me that he has only been taking glipizide in the evening. At dinner last evening (he took glipizide before dinner) he began feeling sweaty, like his b/s was dropping. He checked his b/s and it was 68. He is concerned that his b/s is dropping too much. His avg readings have been 125 or below. Please advise.

## 2016-02-16 ENCOUNTER — Encounter (HOSPITAL_COMMUNITY)
Admission: RE | Admit: 2016-02-16 | Discharge: 2016-02-16 | Disposition: A | Discharge status: Home / Self Care | Payer: Medicare Other | Source: Ambulatory Visit | Attending: Cardiology | Admitting: Cardiology

## 2016-02-16 ENCOUNTER — Telehealth (HOSPITAL_COMMUNITY): Payer: Self-pay | Admitting: *Deleted

## 2016-02-16 DIAGNOSIS — Z955 Presence of coronary angioplasty implant and graft: Secondary | ICD-10-CM | POA: Insufficient documentation

## 2016-02-16 DIAGNOSIS — I214 Non-ST elevation (NSTEMI) myocardial infarction: Secondary | ICD-10-CM | POA: Insufficient documentation

## 2016-02-16 NOTE — Progress Notes (Signed)
Discharge Summary  Patient Details  Name: Charles [name] MRN: [id number] Date of Birth: May 27, 1941 Referring Provider:            CARDIAC REHAB PHASE II EXERCISE from 12/20/2015 in [place]   Referring Provider  Charolette Forward MD       Number of Visits: 6  Reason for Discharge:  Early Exit:  Insurance.  Pt has co pay to attend cardiac rehab.  Pt can no longer afford to pay the copay and is exercising at the [facility].  Smoking History:  History  Smoking status  . Former Smoker -- 20 years  . Types: Cigarettes, Pipe, Cigars  . Quit date: 07/06/2012  Smokeless tobacco  . Never Used    Diagnosis:  No diagnosis found.  ADL UCSD:   Initial Exercise Prescription:     Initial Exercise Prescription - 12/21/15 1400    Date of Initial Exercise RX and Referring Provider   Date 11/21/15   Referring Provider Charolette Forward MD   Bike   Level 2.3   Minutes 10   METs 5.03   NuStep   Level 3   Minutes 10   METs 2.8   Track   Laps 14   Minutes 10   METs 3.45   Resistance Training   Training Prescription Yes   Weight 2 lbs   Reps 10-12      Discharge Exercise Prescription (Final Exercise Prescription Changes):     Exercise Prescription Changes - 01/09/16 1000    Exercise Review   Progression Yes   Response to Exercise   Blood Pressure (Admit) 130/80 mmHg   Blood Pressure (Exercise) 156/80 mmHg   Blood Pressure (Exit) 110/76 mmHg   Heart Rate (Admit) 83 bpm   Heart Rate (Exercise) 126 bpm   Heart Rate (Exit) 83 bpm   Rating of Perceived Exertion (Exercise) 13   Comments Home Exercise Given 12/20/15   Duration Progress to 30 minutes of continuous aerobic without signs/symptoms of physical distress   Intensity THRR unchanged   Progression   Progression Continue to progress workloads to maintain intensity without signs/symptoms of physical distress.   Average METs 3.9   Resistance Training   Training Prescription Yes   Weight 2 lbs    Reps 10-12   Interval Training   Interval Training No   Bike   Level 2.3   Minutes 10   METs 5.02   NuStep   Level 4   Minutes 10   METs 3.2   Track   Laps 14   Minutes 10   METs 3.45   Home Exercise Plan   Plans to continue exercise at [employer] [employer] (comment)  [facility]   Frequency Add 2 additional days to program exercise sessions.      Functional Capacity:     6 Minute Walk      11/21/15 1600       6 Minute Walk   Phase Initial     Distance 2016 feet     Walk Time 6 minutes     # of Rest Breaks 0     MPH 3.82     METS 3.86     RPE 13     VO2 Peak 13.5     Symptoms No     Resting HR 79 bpm     Resting BP 118/68 mmHg     Max Ex. HR 117 bpm     Max Ex. BP 138/74 mmHg  Psychological, QOL, Others - Outcomes: PHQ 2/9: Depression screen [facility] 2/9 11/27/2015 07/28/2015 07/14/2014  Decreased Interest 0 0 0  Down, Depressed, Hopeless 0 0 0  PHQ - 2 Score 0 0 0    Quality of Life:     Quality of Life - 11/22/15 1639    Quality of Life Scores   Health/Function Pre 30 %   Socioeconomic Pre 30 %   Psych/Spiritual Pre 28.29 %   Family Pre 28.5 %   GLOBAL Pre 29.4 %      Personal Goals: Goals established at orientation with interventions provided to work toward goal.     Personal Goals and Risk Factors at Admission - 11/21/15 1427    Core Components/Risk Factors/Patient Goals on Admission   Diabetes Yes   Intervention Provide education about signs/symptoms and action to take for hypo/hyperglycemia.;Provide education about proper nutrition, including hydration, and aerobic/resistive exercise prescription along with prescribed medications to achieve blood glucose in normal ranges: Fasting glucose 65-99 mg/dL   Expected Outcomes Short Term: Participant verbalizes understanding of the signs/symptoms and immediate care of hyper/hypoglycemia, proper foot care and importance of medication, aerobic/resistive exercise and nutrition plan for blood glucose  control.;Long Term: Attainment of HbA1C < 7%.   Hypertension Yes   Intervention Provide education on lifestyle modifcations including regular physical activity/exercise, weight management, moderate sodium restriction and increased consumption of fresh fruit, vegetables, and low fat dairy, alcohol moderation, and smoking cessation.;Monitor prescription use compliance.   Expected Outcomes Short Term: Continued assessment and intervention until BP is < 140/63mm HG in hypertensive participants. < 130/24mm HG in hypertensive participants with diabetes, heart failure or chronic kidney disease.;Long Term: Maintenance of blood pressure at goal levels.   Lipids Yes   Intervention Provide education and support for participant on nutrition & aerobic/resistive exercise along with prescribed medications to achieve LDL 70mg , HDL >40mg .   Expected Outcomes Short Term: Participant states understanding of desired cholesterol values and is compliant with medications prescribed. Participant is following exercise prescription and nutrition guidelines.;Long Term: Cholesterol controlled with medications as prescribed, with individualized exercise RX and with personalized nutrition plan. Value goals: LDL < 70mg , HDL > 40 mg.   Personal Goal Other Yes   Personal Goal Learn baseline and limits for exercise, able to mow grass, able to dance with granddaughter at wedding (18 yrs old)   Intervention Provide exercise presciption and education to increase knowledge for lifestyle change   Expected Outcomes Able to return to routine at gym with confidence       Personal Goals Discharge:     Goals and Risk Factor Review      12/05/15 1635 12/27/15 1205         Core Components/Risk Factors/Patient Goals Review   Personal Goals Review Hypertension;Other Weight Management/Obesity;Increase Strength and Stamina;Sedentary;Hypertension;Diabetes;Other  new weight goal 230 lbs      Review Pt is having blood pressure checked  regularly in program.  He has had some elevated responses to exercise on the bike.  He is following prescription provide in program.  We will discuss home exercise in near futrure. His weight has been steady.  Blood pressures are good.  Blood sugars have been too low for exercise.   He is back to mowing grass.  He did have some stress recently with granddaughter, but she is getting better.      Expected Outcomes Able to return to gym on own Continued weight loss and improve blood sugar control.  Nutrition & Weight - Outcomes:     Pre Biometrics - 11/21/15 1607    Pre Biometrics   Height 5\' 11"  (1.803 m)   Weight 237 lb 14 oz (107.9 kg)   Waist Circumference 44 inches   Hip Circumference 41 inches   Waist to Hip Ratio 1.07 %   BMI (Calculated) 33.2   Triceps Skinfold 15 mm   % Body Fat 31.3 %   Grip Strength 43 kg   Flexibility 12 in   Single Leg Stand 3.65 seconds       Nutrition:     Nutrition Therapy & Goals - 12/25/15 1322    Nutrition Therapy   Diet Diabetic, Therapeutic Lifestyle Changes   Personal Nutrition Goals   Personal Goal #1 0.5-2 lb wt loss per week to a goal wt loss of 6-24 lb at graduation from [place], educate and counsel regarding individualized specific dietary modifications aiming towards targeted core components such as weight, hypertension, lipid management, diabetes, heart failure and other comorbidities.;Nutrition handout(s) given to patient.  Handouts given for Nutrition I, Nutrition II, and Diabetes Blitz classes   Expected Outcomes Short Term Goal: Understand basic principles of dietary content, such as calories, fat, sodium, cholesterol and nutrients.;Long Term Goal: Adherence to prescribed nutrition plan.      Nutrition Discharge:     Nutrition Assessments - 02/26/16 0809    MEDFICTS Scores   Pre Score 26   Post Score --  Pt did not complete post MEDFICTS      Education  Questionnaire Score:     Knowledge Questionnaire Score - 11/22/15 1639    Knowledge Questionnaire Score   Pre Score 21/24      Pt last exercise session was 01/03/16.  Pt elected not to return to cardiac rehab and continue to exercise on his own at the [facility]  Due to the short time period, unable to achieve goals.  Pt present for 6 exercise sessions and had 4 incomplete sessions due to hypogycemia.  Advised pt to continue close monitoring of his diabetes management. Pt has upcoming appt with Dr. Letta Median later this month. [name], BSN

## 2016-02-16 NOTE — Telephone Encounter (Signed)
Pt had not returned back to exercise as expected form out of state travel.  Called and spoke to pt. Pt would like to discontinue his participation in Cardiac Rehab.  Pt is exercising at the [facility] and will continue to exercise there.  Pt also cites having a to pay a co pay which has become a financial burden.  Pt advised will send a postage paid envelope to return parking badge.  Pt verbalized understanding. [name], BSN

## 2016-02-19 ENCOUNTER — Encounter (HOSPITAL_COMMUNITY): Payer: Medicare Other

## 2016-02-21 ENCOUNTER — Encounter (HOSPITAL_COMMUNITY): Payer: Medicare Other

## 2016-02-23 ENCOUNTER — Encounter (HOSPITAL_COMMUNITY): Payer: Medicare Other

## 2016-02-26 ENCOUNTER — Encounter (HOSPITAL_COMMUNITY): Payer: Medicare Other

## 2016-02-27 ENCOUNTER — Telehealth: Payer: Self-pay | Admitting: Internal Medicine

## 2016-02-27 NOTE — Telephone Encounter (Signed)
Please call with info to his cell phone or put on mychart he will be traveling

## 2016-02-27 NOTE — Telephone Encounter (Signed)
Pt called back and wanted to verify if he should continue the 1/4 tab of glipizide. Pt stated he takes the medication daily Monday-Friday and BID on Saturday and Sunday. Please advise, Thanks!

## 2016-02-27 NOTE — Telephone Encounter (Signed)
I contacted the pt and advised of MD response.

## 2016-02-27 NOTE — Telephone Encounter (Signed)
Excellent

## 2016-02-27 NOTE — Telephone Encounter (Signed)
Yes, let's continue the same meds for now.

## 2016-02-27 NOTE — Telephone Encounter (Signed)
Patient is calling to give 2 weeks of b/s readings

## 2016-02-27 NOTE — Telephone Encounter (Addendum)
See note below and advise, Thanks!

## 2016-02-27 NOTE — Telephone Encounter (Signed)
6/07 75 °6/08 91 °6/9 109 °6/10 139 °6/11 105 °6/12 81 °6/13 210 °6/14 174 °6/15 83 °6/16 138 °6/17 103 °6/18 93 °6/19 115 ° °

## 2016-02-28 ENCOUNTER — Encounter (HOSPITAL_COMMUNITY): Payer: Medicare Other

## 2016-02-28 ENCOUNTER — Telehealth: Payer: Self-pay

## 2016-02-28 NOTE — Telephone Encounter (Signed)
Called patient, left message; patient called back. Informed patient to continue the same medications for now. No questions or concerns from patient noted.

## 2016-02-29 ENCOUNTER — Encounter (HOSPITAL_COMMUNITY): Payer: Self-pay | Admitting: *Deleted

## 2016-03-01 ENCOUNTER — Encounter (HOSPITAL_COMMUNITY): Payer: Medicare Other

## 2016-03-01 NOTE — Telephone Encounter (Signed)
Contacted patient and notified him to continue same meds for now.

## 2016-03-04 ENCOUNTER — Encounter (HOSPITAL_COMMUNITY): Payer: Medicare Other

## 2016-03-06 ENCOUNTER — Encounter (HOSPITAL_COMMUNITY): Payer: Medicare Other

## 2016-03-08 ENCOUNTER — Encounter (HOSPITAL_COMMUNITY): Payer: Medicare Other

## 2016-03-11 ENCOUNTER — Encounter (HOSPITAL_COMMUNITY): Payer: Medicare Other

## 2016-03-13 ENCOUNTER — Encounter (HOSPITAL_COMMUNITY): Payer: Medicare Other

## 2016-03-15 ENCOUNTER — Encounter (HOSPITAL_COMMUNITY): Payer: Medicare Other

## 2016-03-18 ENCOUNTER — Encounter (HOSPITAL_COMMUNITY): Payer: Medicare Other

## 2016-03-19 ENCOUNTER — Ambulatory Visit: Payer: Medicare Other

## 2016-03-19 ENCOUNTER — Ambulatory Visit (INDEPENDENT_AMBULATORY_CARE_PROVIDER_SITE_OTHER): Payer: Medicare Other

## 2016-03-19 DIAGNOSIS — E538 Deficiency of other specified B group vitamins: Secondary | ICD-10-CM

## 2016-03-19 MED ORDER — CYANOCOBALAMIN 1000 MCG/ML IJ SOLN
1000.0000 ug | Freq: Once | INTRAMUSCULAR | Status: AC
  Administered 2016-03-19: 1000 ug via INTRAMUSCULAR

## 2016-03-20 ENCOUNTER — Encounter (HOSPITAL_COMMUNITY): Payer: Medicare Other

## 2016-03-22 ENCOUNTER — Encounter (HOSPITAL_COMMUNITY): Payer: Medicare Other

## 2016-03-25 ENCOUNTER — Encounter (HOSPITAL_COMMUNITY): Payer: Medicare Other

## 2016-03-27 ENCOUNTER — Encounter (HOSPITAL_COMMUNITY): Payer: Medicare Other

## 2016-03-29 ENCOUNTER — Encounter (HOSPITAL_COMMUNITY): Payer: Medicare Other

## 2016-04-12 ENCOUNTER — Other Ambulatory Visit: Payer: Self-pay | Admitting: Internal Medicine

## 2016-04-19 ENCOUNTER — Encounter: Payer: Self-pay | Admitting: Internal Medicine

## 2016-04-19 ENCOUNTER — Ambulatory Visit (INDEPENDENT_AMBULATORY_CARE_PROVIDER_SITE_OTHER): Payer: Medicare Other | Admitting: Internal Medicine

## 2016-04-19 VITALS — BP 134/82 | HR 75 | Ht 71.5 in | Wt 244.0 lb

## 2016-04-19 DIAGNOSIS — E538 Deficiency of other specified B group vitamins: Secondary | ICD-10-CM

## 2016-04-19 DIAGNOSIS — E1122 Type 2 diabetes mellitus with diabetic chronic kidney disease: Secondary | ICD-10-CM

## 2016-04-19 DIAGNOSIS — N182 Chronic kidney disease, stage 2 (mild): Secondary | ICD-10-CM | POA: Diagnosis not present

## 2016-04-19 MED ORDER — GLIPIZIDE 5 MG PO TABS: 2.5000 mg | ORAL_TABLET | Freq: Two times a day (BID) | ORAL | 2 refills | Status: DC

## 2016-04-19 NOTE — Progress Notes (Signed)
Patient ID: Charles [name], male   DOB: 1941-02-22, 75 y.o.   MRN: [id number]  HPI: Charles [name] is a 75 y.o.-year-old male, returning for f/u for DM2 dx 2008, non-insulin-dependent, uncontrolled, with complications (CAD - s/p NSTEMI, PN, mild CKD). Last visit 3 mo ago.   He was admitted with NSTEMI 10/18/2015. He had a stent.   He is now in cardiac rehab - goes 5x a week (M-F).   DM2: Last hemoglobin A1c was: Lab Results  Component Value Date   HGBA1C 6.0 01/18/2016   HGBA1C 6.7 (H) 10/19/2015   HGBA1C 6.3 10/13/2015  12/2013: 8.6%  Pt is on a regimen of: - Metformin 1000 mg po bid - Glipizide 2.5 mg at dinnertime 7/7 days; 2.5 mg in am 2/7 days (M-F does not take it)  Pt checks his sugars once a day - ave: 113 - am: 110-140, occasionally 180 >> 86-135 >> 68-102, 124x1 >> 61, 75-120, 148 >>  104-140 >> 85-125 >> 88-140 - 2h after b'fast: 106-187 >> 78-116 >> n/c >> 68x1 - before lunch: 79-90, 157 >> n/c - 2h after lunch: 123-176 >> n/c - before dinner: 106, 125 >> n/c >> 57, 106 - 2h after dinner: 173-197 >> n/c - bedtime: 103, 115, 138 No lows. Lowest sugar was 61 >> 52 >> 57; he has hypoglycemia awareness in the 60s. Highest sugar was 160 >> 195 >> 200 (no meds, large dessert)  - has mild CKD, last BUN/creatinine:  Lab Results  Component Value Date   BUN 23 (H) 10/22/2015   CREATININE 1.49 (H) 10/22/2015  of note, he had a left nephrectomy. He is on lisinopril 40. ACR in 03/2013: 5.3.  - He has HTG; last set of lipids: Lab Results  Component Value Date   CHOL 175 10/19/2015   HDL 52 10/19/2015   LDLCALC 107 (H) 10/19/2015   LDLDIRECT 139.8 07/08/2014   TRIG 79 10/19/2015   CHOLHDL 3.4 10/19/2015   - last eye exam was in 10/2015 - Dr Delman Cheadle. No DR.  - + numbness and tingling in his feet >> improved.  At last visit, we diagnosed B12 deficiency: Component     Latest Ref Rng 10/13/2015  Vitamin B12     211 - 911 pg/mL 94 (L)  We started B12 im 6 mo ago. Had last  injection 1 mo ago.  ROS: Constitutional: no weight gain/loss, no fatigue, no subjective hyperthermia/hypothermia, no nocturia Eyes: no blurry vision, no xerophthalmia ENT: no sore throat, no nodules palpated in throat, no dysphagia/odynophagia, no hoarseness Cardiovascular: no CP/SOB/palpitations/leg swelling  Respiratory: no cough/SOB Gastrointestinal: no N/V/D/C Musculoskeletal: no muscle/joint aches Skin: + rash - stasis dermatitis R leg, no easy bruising Neurological: no tremors/numbness/no tingling/dizziness  I reviewed pt's medications, allergies, PMH, social hx, family hx, and changes were documented in the history of present illness. Otherwise, unchanged from my initial visit note. Stopped Amiodarone, started Metoprolol.  PE: BP 134/82 (BP Location: Left Arm, Patient Position: Sitting)   Pulse 75   Ht 5' 11.5" (1.816 m)   Wt 244 lb (110.7 kg)   SpO2 96%   BMI 33.56 kg/m  Wt Readings from Last 3 Encounters:  04/19/16 244 lb (110.7 kg)  02/06/16 243 lb 1.6 oz (110.3 kg)  01/18/16 237 lb (107.5 kg)   Constitutional: overweight, in NAD Eyes: PERRLA, EOMI, no exophthalmos ENT: moist mucous membranes, no thyromegaly, no cervical lymphadenopathy Cardiovascular: tachycardia, RR, No MRG, + periankle edema  - mild - R>L Respiratory: CTA  B Gastrointestinal: abdomen soft, NT, ND, BS+ Musculoskeletal: no deformities, strength intact in all 4 Skin: moist, warm, no rashes Neurological: no tremor with outstretched hands, DTR normal in all 4  ASSESSMENT: 1. DM2, non-insulin-dependent, now controlled, with complications - CAD - s/p NSTEMI (Dr. Terrence Dupont) - peripheral neuropathy - CKD  2. B12 def  PLAN:  1. Patient with long-standing, now controlled diabetes, on oral antidiabetic regimen. Sugars mostly at goal but had some lows >> will decrease Glipizide to take it only with a larger meal.  Patient Instructions  Please continue: - Metformin 1000 mg 2x a day  Decrease: -  Glipizide 2.5 mg before a large meal  Please stop at the lab.  Please start 5000 mcg B12 vitamin.  Please return in 3 months with your sugar log.   - check HbA1c today - UTD with eye exams - Return to clinic in 3 months with sugar log   2. PN - since he is on Metformin, we checked a B12 vitamin in 10/2015 >> very low - we started B12 injections - had 6 >> will check a B2 level and then start po B12 - 5000 mcg daily  Office Visit on 04/19/2016  Component Date Value Ref Range Status  . Hgb A1c MFr Bld 04/20/2016 6.4* 4.8 - 5.6 % Final   Comment:          Pre-diabetes: 5.7 - 6.4          Diabetes: >6.4          Glycemic control for adults with diabetes: <7.0   . Est. average glucose Bld gHb Est-m* 04/20/2016 137  mg/dL Final  . Vitamin B-12 04/20/2016 217  211 - 946 pg/mL Final   Message sent: Dear Charles [name], The HbA1c was still good! However, the vitamin B12 was quite low, so at this point I would suggest to continue monthly B12 injections. Please let us know when you can come for the next one. Sincerely, Philemon Kingdom MD Philemon Kingdom, MD PhD [facility]

## 2016-04-19 NOTE — Patient Instructions (Addendum)
Please continue: - Metformin 1000 mg 2x a day  Decrease: - Glipizide 2.5 mg before a large meal  Please stop at the lab.  Please start 5000 mcg B12 vitamin.  Please return in 3 months with your sugar log.

## 2016-04-20 LAB — VITAMIN B12: VITAMIN B 12: 217 pg/mL (ref 211–946)

## 2016-04-20 LAB — HEMOGLOBIN A1C
ESTIMATED AVERAGE GLUCOSE: 137 mg/dL
HEMOGLOBIN A1C: 6.4 % — AB (ref 4.8–5.6)

## 2016-04-25 ENCOUNTER — Ambulatory Visit (INDEPENDENT_AMBULATORY_CARE_PROVIDER_SITE_OTHER): Payer: Medicare Other

## 2016-04-25 DIAGNOSIS — E538 Deficiency of other specified B group vitamins: Secondary | ICD-10-CM | POA: Diagnosis not present

## 2016-04-25 MED ORDER — CYANOCOBALAMIN 1000 MCG/ML IJ SOLN
1000.0000 ug | Freq: Once | INTRAMUSCULAR | Status: AC
  Administered 2016-04-25: 1000 ug via INTRAMUSCULAR

## 2016-05-28 ENCOUNTER — Ambulatory Visit (INDEPENDENT_AMBULATORY_CARE_PROVIDER_SITE_OTHER): Payer: Medicare Other

## 2016-05-28 DIAGNOSIS — E538 Deficiency of other specified B group vitamins: Secondary | ICD-10-CM | POA: Diagnosis not present

## 2016-05-28 MED ORDER — CYANOCOBALAMIN 1000 MCG/ML IJ SOLN
1000.0000 ug | Freq: Once | INTRAMUSCULAR | Status: AC
  Administered 2016-05-28: 1000 ug via INTRAMUSCULAR

## 2016-06-11 ENCOUNTER — Ambulatory Visit (INDEPENDENT_AMBULATORY_CARE_PROVIDER_SITE_OTHER): Payer: Medicare Other | Admitting: Adult Health

## 2016-06-11 ENCOUNTER — Encounter: Payer: Self-pay | Admitting: Internal Medicine

## 2016-06-11 ENCOUNTER — Encounter: Payer: Self-pay | Admitting: Adult Health

## 2016-06-11 VITALS — BP 138/80 | Temp 98.2°F | Ht 71.5 in | Wt 243.0 lb

## 2016-06-11 DIAGNOSIS — Z23 Encounter for immunization: Secondary | ICD-10-CM | POA: Diagnosis not present

## 2016-06-11 DIAGNOSIS — M7021 Olecranon bursitis, right elbow: Secondary | ICD-10-CM | POA: Diagnosis not present

## 2016-06-11 MED ORDER — CEPHALEXIN 500 MG PO CAPS: 500.0000 mg | ORAL_CAPSULE | Freq: Three times a day (TID) | ORAL | 0 refills | Status: DC

## 2016-06-11 NOTE — Patient Instructions (Addendum)
It was great seeing you today!  I have sent in a prescription for Keflex, take this three times a day for 10 days.   I will follow up with you regarding your labs  Keep as much pressure on the elbow as you can and try not to cause any trauma to the area.    Elbow Bursitis Elbow bursitis is inflammation of the fluid-filled sac (bursa) between the tip of your elbow bone (olecranon) and your skin. Elbow bursitis may also be called olecranon bursitis. Normally, the olecranon bursa has only a small amount of fluid in it to cushion and protect your elbow bone. Elbow bursitis causes fluid to build up inside the bursa. Over time, this swelling and inflammation can cause pain when you bend or lean on your elbow.  CAUSES Elbow bursitis may be caused by:   Elbow injury (acute trauma).  Leaning on hard surfaces for long periods of time.  Infection from an injury that breaks the skin near your elbow.  A bone growth (spur) that forms at the tip of your elbow.  A medical condition that causes inflammation in your body, such as gout or rheumatoid arthritis.  The cause may also be unknown.  SIGNS AND SYMPTOMS  The first sign of elbow bursitis is usually swelling over the tip of your elbow. This can grow to be the size of a golf ball. This may start suddenly or develop gradually. You may also have:  Pain when bending or leaning on your elbow.  Restricted movement of your elbow.  If your bursitis is caused by an infection, symptoms may also include:  Redness, warmth, and tenderness of the elbow.  Drainage of pus from the swollen area over your elbow, if the skin breaks open. DIAGNOSIS  Your health care provider may be able to diagnose elbow bursitis based on your signs and symptoms, especially if you have recently been injured. Your health care provider will also do a physical exam. This may include:  X-rays to look for a bone spur or a bone fracture.  Draining fluid from the bursa to test it  for infection.  Blood tests to rule out gout or rheumatoid arthritis. TREATMENT  Treatment for elbow bursitis depends on the cause. Treatment may include:  Medicines. These may include:  Over-the-counter medicines to relieve pain and inflammation.  Antibiotic medicines to fight infection.  Injections of anti-inflammatory medicines (steroids).  Wrapping your elbow with a bandage.  Draining fluid from the bursa.  Wearing elbow pads.  If your bursitis does not get better with treatment, surgery may be needed to remove the bursa.  HOME CARE INSTRUCTIONS   Take medicines only as directed by your health care provider.  If you were prescribed an antibiotic medicine, finish all of it even if you start to feel better.  If your bursitis is caused by an injury, rest your elbow and wear your bandage as directed by your health care provider. You may alsoapply ice to the injured area as directed by your health care provider:  Put ice in a plastic bag.  Place a towel between your skin and the bag.  Leave the ice on for 20 minutes, 2-3 times per day.  Avoid any activities that cause elbow pain.  Use elbow pads or elbow wraps to cushion your elbow. SEEK MEDICAL CARE IF:  You have a fever.   Your symptoms do not get better with treatment.  Your pain or swelling gets worse.  Your elbow pain or  swelling goes away and then returns.  You have drainage of pus from the swollen area over your elbow.   This information is not intended to replace advice given to you by your health care provider. Make sure you discuss any questions you have with your health care provider.   Document Released: 09/25/2006 Document Revised: 09/16/2014 Document Reviewed: 05/04/2014 Elsevier Interactive Patient Education [employer].

## 2016-06-11 NOTE — Progress Notes (Signed)
   Subjective:    Patient ID: Charles [name], male    DOB: 07-29-1941, 75 y.o.   MRN: [id number]  HPI  75 year old male who presents to the office today for right elbow pain and swelling. He reports that 4 days ago he hit his elbow on a kitchen counter. Since that time his elbow has started to swell and he feels as though he has fluid on in  He has had no discomfort until yesterday.   Denies any history of gout.   Has not had any fevers or feeling acutely ill  Review of Systems  Constitutional: Negative.   Respiratory: Negative.   Cardiovascular: Negative.   Musculoskeletal: Positive for joint swelling.  Skin: Negative.   All other systems reviewed and are negative.      Objective:   Physical Exam  Constitutional: He is oriented to person, place, and time. He appears well-developed and well-nourished. No distress.  Cardiovascular: Normal rate, regular rhythm, normal heart sounds and intact distal pulses.  Exam reveals no gallop and no friction rub.   No murmur heard. Pulmonary/Chest: Effort normal and breath sounds normal. No respiratory distress. He has no wheezes. He has no rales. He exhibits no tenderness.  Musculoskeletal: Normal range of motion. He exhibits edema and tenderness.  Fluid filled bursa over the right olecranon   Neurological: He is alert and oriented to person, place, and time. He displays normal reflexes. No cranial nerve deficit. He exhibits normal muscle tone. Coordination normal.  Skin: Skin is warm and dry. No rash noted. He is not diaphoretic. No erythema. No pallor.  Localized redness and warmth located over the right elbow.   Psychiatric: He has a normal mood and affect. His behavior is normal. Judgment and thought content normal.  Nursing note and vitals reviewed.     Assessment & Plan:  1. Olecranon bursitis of right elbow - cephALEXin (KEFLEX) 500 MG capsule; Take 1 capsule (500 mg total) by mouth 3 (three) times daily.  Dispense: 30 capsule; Refill:  0 - Body fluid culture - Body fluid culture - Synovial fluid, crystal Date/Time: 06/11/2016 Performed by: [name] Consent:    Consent obtained:  Written    Consent given by:  Patient   Risks discussed:  Infection, pain, poor cosmetic result, need for additional drainage, nerve damage and vascular damage   Alternatives discussed:  No treatment, delayed treatment and referral Anesthesia (see MAR for exact dosages):    Local anesthetic:Cold spray  Pre-procedure details:    Preparation:  Patient was prepped and draped in usual sterile fashion Treatment:    Area cleansed with:  Betadine   Amount of cleaning:  Standard   Visualized foreign bodies/material removed: no   - Approx. 6 cc yellow cloudy bursa fluid was removed. Approx 4 cc bloody fluid was removed from right olecranon bursa    Patient tolerance of procedure:  Tolerated well, no immediate complications. Wrapped right elbow in ace bandage. Advised return precautions.    2. Need for prophylactic vaccination and inoculation against influenza - Flu vaccine HIGH DOSE PF (Fluzone High dose)    [name], NP

## 2016-06-12 LAB — SYNOVIAL FLUID, CRYSTAL

## 2016-06-13 ENCOUNTER — Telehealth: Payer: Self-pay | Admitting: Adult Health

## 2016-06-13 ENCOUNTER — Encounter: Payer: Self-pay | Admitting: Adult Health

## 2016-06-13 ENCOUNTER — Ambulatory Visit (INDEPENDENT_AMBULATORY_CARE_PROVIDER_SITE_OTHER): Payer: Medicare Other | Admitting: Adult Health

## 2016-06-13 VITALS — BP 110/70 | HR 81 | Temp 98.6°F | Resp 20 | Ht 71.5 in | Wt 245.2 lb

## 2016-06-13 DIAGNOSIS — M7021 Olecranon bursitis, right elbow: Secondary | ICD-10-CM | POA: Diagnosis not present

## 2016-06-13 NOTE — Progress Notes (Signed)
Subjective:    Patient ID: Charles [name], male    DOB: 06-13-1941, 75 y.o.   MRN: [id number]  HPI  75 year old male who presents to the office today for continued right elbow pain and swelling. I last saw him on 2 days ago and drained a total of 10 cc of synovial fluid from his right olecranon bursitis.   He has been wearing a compression sleeve but felt like it was causing him more pain.   Today in the office he reports that " some of the fluid in my elbow came back. It isn't as bad as it was the first time."   Denies any pain, redness, or warmth   Review of Systems  Constitutional: Negative.   Respiratory: Negative.   Cardiovascular: Negative.   Musculoskeletal: Positive for joint swelling.  All other systems reviewed and are negative.  Past Medical History:  Diagnosis Date  . Anemia    "when I was a lad"  . Arthritis    "right femur" (10/19/2015)  . AVM (arteriovenous malformation) of colon    2 - non-bleeding 2013  . Biceps muscle tear    right  . Cancer of kidney ([place]) 2000   left nephrectomy  . Coronary artery disease   . Eczema   . GERD (gastroesophageal reflux disease)   . Hernia, ventral   . Hyperlipidemia   . Hypertension   . Obesity   . Personal history of adenomatous colonic polyps 07/20/2012   3 + adenomas 2009 07/20/2012 2 diminutive polyps    . Pneumonia 1942; 1950s X 1  . Type II diabetes mellitus ([place])   . Walking pneumonia 2000's X 1    Social History   Social History  . Marital status: Widowed    Spouse name: N/A  . Number of children: N/A  . Years of education: N/A   Occupational History  . Not on file.   Social History Main Topics  . Smoking status: Former Smoker    Years: 20.00    Types: Cigarettes, Pipe, Cigars    Quit date: 07/06/2012  . Smokeless tobacco: Never Used  . Alcohol use 0.6 oz/week    1 Cans of beer per week  . Drug use: No  . Sexual activity: No   Other Topics Concern  . 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    Not married- widowed    Three children, One in [place], one in [place] one in [place]    Past Surgical History:  Procedure Laterality Date  . CARDIAC CATHETERIZATION N/A 10/19/2015   Procedure: Left Heart Cath and Coronary Angiography;  Surgeon: Charolette Forward, MD;  Location: [facility];  Service: Cardiovascular;  Laterality: N/A;  . CARDIAC CATHETERIZATION N/A 10/19/2015   Procedure: Coronary Stent Intervention;  Surgeon: Charolette Forward, MD;  Location: [facility];  Service: Cardiovascular;  Laterality: N/A;  . COLONOSCOPY     "nothing showed up this time"  . COLONOSCOPY W/ BIOPSIES AND POLYPECTOMY  X 1  . CORONARY ANGIOPLASTY    . FEMUR IM NAIL Right 07/26/2013   Procedure: INTRAMEDULLARY (IM) NAIL FEMORAL subtrochanteric;  Surgeon: Mauri Pole, MD;  Location: [facility];  Service: Orthopedics;  Laterality: Right;  . FRACTURE SURGERY    . INGUINAL HERNIA REPAIR Left >3 times  . INGUINAL HERNIA REPAIR  Right 2012  . NEPHRECTOMY Left 2000  . TONSILLECTOMY  1940s    Family History  Problem Relation Age of Onset  . Drug abuse Other   . Cancer Other   . Heart disease Other   . Lung disease Other   . Colon cancer Neg Hx   . Stomach cancer Neg Hx   . Diabetes Mother   . Diabetes Sister     No Known Allergies  Current Outpatient Prescriptions on File Prior to Visit  Medication Sig Dispense Refill  . aspirin 81 MG chewable tablet Chew 1 tablet (81 mg total) by mouth daily.    Marland Kitchen atorvastatin (LIPITOR) 40 MG tablet Take 1 tablet (40 mg total) by mouth daily at 6 PM. 30 tablet 3  . cephALEXin (KEFLEX) 500 MG capsule Take 1 capsule (500 mg total) by mouth 3 (three) times daily. 30 capsule 0  . Cyanocobalamin (VITAMIN B-12 IJ) Inject 1 mL as directed every 30 (thirty) days.    Marland Kitchen EPINEPHrine (EPIPEN 2-PAK) 0.3 mg/0.3 mL DEVI Inject 0.3 mg into the  muscle once.      Marland Kitchen glipiZIDE (GLUCOTROL) 5 MG tablet Take 0.5 tablets (2.5 mg total) by mouth 2 (two) times daily before a meal. 45 tablet 2  . lisinopril (PRINIVIL,ZESTRIL) 5 MG tablet Take 1 tablet (5 mg total) by mouth daily. 30 tablet 3  . metFORMIN (GLUCOPHAGE) 1000 MG tablet Take 1 tablet by mouth two  times daily with meals 180 tablet 0  . metoprolol succinate (TOPROL-XL) 50 MG 24 hr tablet     . Multiple Vitamins-Minerals (ICAPS PO) Take 1 tablet by mouth daily.    . nitroGLYCERIN (NITROSTAT) 0.4 MG SL tablet Place 1 tablet (0.4 mg total) under the tongue every 5 (five) minutes as needed for chest pain. 25 tablet 1  . ONETOUCH VERIO test strip USE ONE STRIP TO CHECK GLUCOSE ONCE DAILY 100 each 3  . ticagrelor (BRILINTA) 90 MG TABS tablet Take 1 tablet (90 mg total) by mouth 2 (two) times daily. 60 tablet 3   No current facility-administered medications on file prior to visit.     BP 110/70 (BP Location: Left Arm, Patient Position: Sitting, Cuff Size: Large)   Pulse 81   Temp 98.6 F (37 C) (Oral)   Resp 20   Ht 5' 11.5" (1.816 m)   Wt 245 lb 4 oz (111.2 kg)   SpO2 97%   BMI 33.73 kg/m       Objective:   Physical Exam  Constitutional: He is oriented to person, place, and time. He appears well-developed and well-nourished. No distress.  Musculoskeletal: Normal range of motion. He exhibits no edema, tenderness or deformity.  Fluid filled bursa over the right olecranon . No redness or warmth   Neurological: He is alert and oriented to person, place, and time.  Skin: Skin is warm and dry. He is not diaphoretic.  Psychiatric: He has a normal mood and affect. His behavior is normal. Judgment and thought content normal.  Nursing note and vitals reviewed.     Assessment & Plan:  1. Olecranon bursitis of right elbow Date/Time: 06/13/2016 Performed [id number] Aara Jacquot Consent:  Consent obtained: Written  Consent given by: Patient Risks discussed: Infection, pain, poor  cosmetic result, need for additional drainage, nerve damage and vascular damage Alternatives discussed: No treatment, delayed treatment and referral Anesthesia (see MAR for exact dosages):  Local anesthetic:Cold spray  Pre-procedure details:  Preparation: Patient was prepped and draped in usual sterile fashion Treatment:  Area cleansed with: Betadine Amount of cleaning: Standard Visualized foreign bodies/material removed: no - Approx.5cc yellow cloudy bursa fluid was removed from right olecranon bursa  Patient tolerance of procedure: Tolerated well, no immediate complications. Wrapped right elbow in ace bandage. Advised return precautions.   [name], NP

## 2016-06-13 NOTE — Progress Notes (Signed)
Pre visit review using our clinic review tool, if applicable. No additional management support is needed unless otherwise documented below in the visit note. 

## 2016-06-13 NOTE — Telephone Encounter (Signed)
Cory, please see message and advise. 

## 2016-06-13 NOTE — Telephone Encounter (Signed)
That is fine 

## 2016-06-13 NOTE — Telephone Encounter (Signed)
Pt has been getting vit b injection from his neurologist. Pt would like to start coming to our office for the injections due to co pay will be cheaper. Can I sch?

## 2016-06-13 NOTE — Telephone Encounter (Signed)
Pt is aware we can start giving him b12 injection

## 2016-06-15 LAB — BODY FLUID CULTURE
GRAM STAIN: NONE SEEN
ORGANISM ID, BACTERIA: NO GROWTH

## 2016-06-18 ENCOUNTER — Encounter: Payer: Self-pay | Admitting: Adult Health

## 2016-06-18 ENCOUNTER — Ambulatory Visit (INDEPENDENT_AMBULATORY_CARE_PROVIDER_SITE_OTHER): Payer: Medicare Other | Admitting: Adult Health

## 2016-06-18 VITALS — BP 124/62 | Temp 98.2°F

## 2016-06-18 DIAGNOSIS — M7021 Olecranon bursitis, right elbow: Secondary | ICD-10-CM | POA: Diagnosis not present

## 2016-06-18 NOTE — Progress Notes (Signed)
Subjective:    Patient ID: Charles [name], male    DOB: September 28, 1940, 75 y.o.   MRN: [id number]  HPI 75 year old male who presents to the office today for follow up of olecranon bursitis. I last drained his elbow five days ago. He has not been wearing a compression sleeve.   Denies any pain, redness, or warmth    Review of Systems  Constitutional: Negative.   Musculoskeletal: Positive for joint swelling.  Skin: Negative.   All other systems reviewed and are negative.  Past Medical History:  Diagnosis Date  . Anemia    "when I was a lad"  . Arthritis    "right femur" (10/19/2015)  . AVM (arteriovenous malformation) of colon    2 - non-bleeding 2013  . Biceps muscle tear    right  . Cancer of kidney ([place]) 2000   left nephrectomy  . Coronary artery disease   . Eczema   . GERD (gastroesophageal reflux disease)   . Hernia, ventral   . Hyperlipidemia   . Hypertension   . Obesity   . Personal history of adenomatous colonic polyps 07/20/2012   3 + adenomas 2009 07/20/2012 2 diminutive polyps    . Pneumonia 1942; 1950s X 1  . Type II diabetes mellitus ([place])   . Walking pneumonia 2000's X 1    Social History   Social History  . Marital status: Widowed    Spouse name: N/A  . Number of children: N/A  . Years of education: N/A   Occupational History  . Not on file.   Social History Main Topics  . Smoking status: Former Smoker    Years: 20.00    Types: Cigarettes, Pipe, Cigars    Quit date: 07/06/2012  . Smokeless tobacco: Never Used  . Alcohol use 0.6 oz/week    1 Cans of beer per week  . Drug use: No  . Sexual activity: No   Other Topics Concern  . 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    Not married- widowed    Three children, One in [place], one in [place] one in [place]    Past Surgical History:  Procedure Laterality Date  .  CARDIAC CATHETERIZATION N/A 10/19/2015   Procedure: Left Heart Cath and Coronary Angiography;  Surgeon: Charolette Forward, MD;  Location: [facility];  Service: Cardiovascular;  Laterality: N/A;  . CARDIAC CATHETERIZATION N/A 10/19/2015   Procedure: Coronary Stent Intervention;  Surgeon: Charolette Forward, MD;  Location: [facility];  Service: Cardiovascular;  Laterality: N/A;  . COLONOSCOPY     "nothing showed up this time"  . COLONOSCOPY W/ BIOPSIES AND POLYPECTOMY  X 1  . CORONARY ANGIOPLASTY    . FEMUR IM NAIL Right 07/26/2013   Procedure: INTRAMEDULLARY (IM) NAIL FEMORAL subtrochanteric;  Surgeon: Mauri Pole, MD;  Location: [facility];  Service: Orthopedics;  Laterality: Right;  . FRACTURE SURGERY    . INGUINAL HERNIA REPAIR Left >3 times  . INGUINAL HERNIA REPAIR Right 2012  . NEPHRECTOMY Left 2000  . TONSILLECTOMY  1940s    Family History  Problem Relation Age of Onset  . Drug abuse Other   . Cancer Other   . Heart disease Other   . Lung disease Other   . Colon cancer Neg Hx   . Stomach cancer Neg Hx   .  Diabetes Mother   . Diabetes Sister     No Known Allergies  Current Outpatient Prescriptions on File Prior to Visit  Medication Sig Dispense Refill  . aspirin 81 MG chewable tablet Chew 1 tablet (81 mg total) by mouth daily.    Marland Kitchen atorvastatin (LIPITOR) 40 MG tablet Take 1 tablet (40 mg total) by mouth daily at 6 PM. 30 tablet 3  . cephALEXin (KEFLEX) 500 MG capsule Take 1 capsule (500 mg total) by mouth 3 (three) times daily. 30 capsule 0  . Cyanocobalamin (VITAMIN B-12 IJ) Inject 1 mL as directed every 30 (thirty) days.    Marland Kitchen EPINEPHrine (EPIPEN 2-PAK) 0.3 mg/0.3 mL DEVI Inject 0.3 mg into the muscle once.      Marland Kitchen glipiZIDE (GLUCOTROL) 5 MG tablet Take 0.5 tablets (2.5 mg total) by mouth 2 (two) times daily before a meal. 45 tablet 2  . lisinopril (PRINIVIL,ZESTRIL) 5 MG tablet Take 1 tablet (5 mg total) by mouth daily. 30 tablet 3  . metFORMIN (GLUCOPHAGE) 1000 MG  tablet Take 1 tablet by mouth two  times daily with meals 180 tablet 0  . metoprolol succinate (TOPROL-XL) 50 MG 24 hr tablet     . Multiple Vitamins-Minerals (ICAPS PO) Take 1 tablet by mouth daily.    . nitroGLYCERIN (NITROSTAT) 0.4 MG SL tablet Place 1 tablet (0.4 mg total) under the tongue every 5 (five) minutes as needed for chest pain. 25 tablet 1  . ONETOUCH VERIO test strip USE ONE STRIP TO CHECK GLUCOSE ONCE DAILY 100 each 3  . ticagrelor (BRILINTA) 90 MG TABS tablet Take 1 tablet (90 mg total) by mouth 2 (two) times daily. 60 tablet 3   No current facility-administered medications on file prior to visit.     BP 124/62   Temp 98.2 F (36.8 C) (Oral)       Objective:   Physical Exam  Constitutional: He is oriented to person, place, and time. He appears well-developed and well-nourished. No distress.  Cardiovascular: Normal rate, regular rhythm, normal heart sounds and intact distal pulses.  Exam reveals no gallop and no friction rub.   No murmur heard. Pulmonary/Chest: Effort normal and breath sounds normal. No respiratory distress. He has no rales. He exhibits no tenderness.  Neurological: He is alert and oriented to person, place, and time.  Skin: Skin is warm and dry. No rash noted. He is not diaphoretic. No erythema. No pallor.  Swelling noted over right elbow   Psychiatric: He has a normal mood and affect. His behavior is normal. Judgment and thought content normal.  Nursing note and vitals reviewed.     Assessment & Plan:  1. Olecranon bursitis of right elbow - Advised patient that this could last months. It is important for him to keep compression on it. Our other option is to go to gen surgery - he does not want to do this at this time.  He would like to have it drained at the visit today  Date/Time: 06/18/2016 Performed [id number] [place] Consent:  Consent obtained: Written  Consent given by: Patient Risks discussed: Infection, pain, poor cosmetic result,  need for additional drainage, nerve damage and vascular damage Alternatives discussed: No treatment, delayed treatment and referral Anesthesia (see MAR for exact dosages):  Local anesthetic:Cold spray  Pre-procedure details:  Preparation: Patient was prepped and draped in usual sterile fashion Treatment:  Area cleansed with: Betadine Amount of cleaning: Standard Visualized foreign bodies/material removed: no - Approx.5cc red clear bursa fluid was removed  from right olecranon bursa  Patient tolerance of procedure: Tolerated well, no immediate complications Advised return precautions.    [name], NP

## 2016-06-27 ENCOUNTER — Ambulatory Visit: Payer: Medicare Other

## 2016-06-27 ENCOUNTER — Ambulatory Visit (INDEPENDENT_AMBULATORY_CARE_PROVIDER_SITE_OTHER): Payer: Medicare Other

## 2016-06-27 DIAGNOSIS — E538 Deficiency of other specified B group vitamins: Secondary | ICD-10-CM | POA: Diagnosis not present

## 2016-06-27 MED ORDER — CYANOCOBALAMIN 1000 MCG/ML IJ SOLN
1000.0000 ug | Freq: Once | INTRAMUSCULAR | Status: AC
  Administered 2016-06-27: 1000 ug via INTRAMUSCULAR

## 2016-07-22 ENCOUNTER — Ambulatory Visit (INDEPENDENT_AMBULATORY_CARE_PROVIDER_SITE_OTHER): Payer: Medicare Other | Admitting: Internal Medicine

## 2016-07-22 ENCOUNTER — Encounter: Payer: Self-pay | Admitting: Internal Medicine

## 2016-07-22 VITALS — BP 134/82 | HR 85 | Temp 98.1°F | Ht 71.0 in | Wt 243.0 lb

## 2016-07-22 DIAGNOSIS — N182 Chronic kidney disease, stage 2 (mild): Secondary | ICD-10-CM | POA: Diagnosis not present

## 2016-07-22 DIAGNOSIS — E1122 Type 2 diabetes mellitus with diabetic chronic kidney disease: Secondary | ICD-10-CM | POA: Diagnosis not present

## 2016-07-22 DIAGNOSIS — E538 Deficiency of other specified B group vitamins: Secondary | ICD-10-CM

## 2016-07-22 LAB — POCT GLYCOSYLATED HEMOGLOBIN (HGB A1C): HEMOGLOBIN A1C: 6.2

## 2016-07-22 MED ORDER — GLIPIZIDE 5 MG PO TABS: 2.5000 mg | ORAL_TABLET | Freq: Two times a day (BID) | ORAL | 2 refills | Status: DC

## 2016-07-22 MED ORDER — METFORMIN HCL 1000 MG PO TABS: ORAL_TABLET | ORAL | 3 refills | Status: DC

## 2016-07-22 NOTE — Patient Instructions (Addendum)
Please continue: - Metformin 1000 mg 2x a day - Glipizide 2.5 mg before dinner and on Sat and Sun also before b'fast  Please return in 3 months with your sugar log.

## 2016-07-22 NOTE — Progress Notes (Signed)
Patient ID: Charles [name], male   DOB: 1941-03-29, 75 y.o.   MRN: [id number]  HPI: Charles [name] is a 75 y.o.-year-old male, returning for f/u for DM2 dx 2008, non-insulin-dependent, uncontrolled, with complications (CAD - s/p NSTEMI, PN, mild CKD). Last visit 3 mo ago.   [facility]   DM2: Last hemoglobin A1c was: Lab Results  Component Value Date   HGBA1C 6.4 (H) 04/19/2016   HGBA1C 6.0 01/18/2016   HGBA1C 6.7 (H) 10/19/2015  12/2013: 8.6%  Pt is on a regimen of: - Metformin 1000 mg po bid - Glipizide 2.5 mg before a large meal  - takes it every day before dinner and only Sat and Sun in am.  Pt checks his sugars once a day - ave: 113 >> 137 - am: 61, 75-120, 148 >>  104-140 >> 85-125 >> 88-140 >> 81-148, 169 - 2h after b'fast: 106-187 >> 78-116 >> n/c >> 68x1 >> n/c - before lunch: 79-90, 157 >> n/c - 2h after lunch: 123-176 >> n/c - before dinner: 106, 125 >> n/c >> 57, 106 >> n/c - 2h after dinner: 173-197 >> n/c - bedtime: 103, 115, 138 No lows. Lowest sugar was  57 >> 81; he has hypoglycemia awareness in the 60s.  Highest sugar was 200 (no meds, large dessert) >> 170  - has mild CKD, last BUN/creatinine:  Lab Results  Component Value Date   BUN 23 (H) 10/22/2015   CREATININE 1.49 (H) 10/22/2015  of note, he had a left nephrectomy. He is on lisinopril 40. ACR in 03/2013: 5.3.  - He has HTG; last set of lipids: Lab Results  Component Value Date   CHOL 175 10/19/2015   HDL 52 10/19/2015   LDLCALC 107 (H) 10/19/2015   LDLDIRECT 139.8 07/08/2014   TRIG 79 10/19/2015   CHOLHDL 3.4 10/19/2015   - last eye exam was in 10/2015 - Dr Delman Cheadle. No DR.  - + numbness and tingling in his feet >> improved.  At last visit, we diagnosed B12 deficiency: Component     Latest Ref Rng 10/13/2015  Vitamin B12     211 - 911 pg/mL 94 (L)  We started B12 im - had 6 >> vitamin B12 still low 1 mo after the last inj: Component     Latest Ref Rng & Units  04/19/2016  Vitamin B12     211 - 946 pg/mL 217   At last visit, I recommended that he restarted inj's. He is back on inj's now >> will switch to get them from PCP as he has a copay if having them here.  He was admitted with NSTEMI 10/18/2015. He had a stent.   ROS: Constitutional: no weight gain/loss, no fatigue, no subjective hyperthermia/hypothermia, no nocturia Eyes: no blurry vision, no xerophthalmia ENT: no sore throat, no nodules palpated in throat, no dysphagia/odynophagia, no hoarseness Cardiovascular: no CP/SOB/palpitations/leg swelling  Respiratory: no cough/SOB Gastrointestinal: no N/V/D/C Musculoskeletal: no muscle/joint aches Skin: no rash Neurological: no tremors/numbness/no tingling/dizziness  I reviewed pt's medications, allergies, PMH, social hx, family hx, and changes were documented in the history of present illness. Otherwise, unchanged from my initial visit note. Stopped Amiodarone, started Metoprolol.  PE: BP 134/82 (BP Location: Left Arm, Patient Position: Sitting, Cuff Size: Large)   Pulse 85   Temp 98.1 F (36.7 C) (Oral)   Ht 5\' 11"  (1.803 m)   Wt 243 lb (110.2 kg)   SpO2 97%  BMI 33.89 kg/m  Wt Readings from Last 3 Encounters:  07/22/16 243 lb (110.2 kg)  06/13/16 245 lb 4 oz (111.2 kg)  06/11/16 243 lb (110.2 kg)   Constitutional: overweight, in NAD Eyes: PERRLA, EOMI, no exophthalmos ENT: moist mucous membranes, no thyromegaly, no cervical lymphadenopathy Cardiovascular: tachycardia, RR, No MRG, + periankle edema  - mild - R>L Respiratory: CTA B Gastrointestinal: abdomen soft, NT, ND, BS+ Musculoskeletal: no deformities, strength intact in all 4 Skin: moist, warm, no rashes Neurological: no tremor with outstretched hands, DTR normal in all 4  ASSESSMENT: 1. DM2, non-insulin-dependent, now controlled, with complications - CAD - s/p NSTEMI (Dr. Terrence Dupont) - peripheral neuropathy - CKD  2. B12 def  PLAN:  1. Patient with long-standing,  now more controlled diabetes, on oral antidiabetic regimen. Sugars higher in am but had some mild lows: 68, after exercise despite decreasing the Glipizide. However, he would like to continue with the current tx during te Holidays. He is traveling to [place] to see his daughter soon. - advised to also check sugars later in the day Patient Instructions  Please continue: - Metformin 1000 mg 2x a day - Glipizide 2.5 mg before dinner and on Sat and Sun also before b'fast  Please return in 3 months with your sugar log.  - check HbA1c today >> 6.2% (better) - UTD with eye exams and flu shot - Return to clinic in 3 months with sugar log   2. PN - since he was on Metformin, we checked a B12 vitamin in 10/2015 >> very low >> he had 6 monthly B12 injections and then start po B12 - 5000 mcg daily >> B12 level still low  >> I suggested to continue the inj's  Philemon Kingdom, MD PhD [facility] Endocrinology

## 2016-07-25 ENCOUNTER — Ambulatory Visit (INDEPENDENT_AMBULATORY_CARE_PROVIDER_SITE_OTHER): Payer: Medicare Other

## 2016-07-25 DIAGNOSIS — E538 Deficiency of other specified B group vitamins: Secondary | ICD-10-CM

## 2016-07-25 MED ORDER — CYANOCOBALAMIN 1000 MCG/ML IJ SOLN
1000.0000 ug | Freq: Once | INTRAMUSCULAR | Status: AC
  Administered 2016-07-25: 1000 ug via INTRAMUSCULAR

## 2016-08-08 ENCOUNTER — Other Ambulatory Visit (INDEPENDENT_AMBULATORY_CARE_PROVIDER_SITE_OTHER): Payer: Medicare Other

## 2016-08-08 DIAGNOSIS — Z0000 Encounter for general adult medical examination without abnormal findings: Secondary | ICD-10-CM

## 2016-08-08 LAB — HEPATIC FUNCTION PANEL
ALK PHOS: 51 U/L (ref 39–117)
ALT: 30 U/L (ref 0–53)
AST: 30 U/L (ref 0–37)
Albumin: 4.1 g/dL (ref 3.5–5.2)
BILIRUBIN TOTAL: 0.6 mg/dL (ref 0.2–1.2)
Bilirubin, Direct: 0.1 mg/dL (ref 0.0–0.3)
Total Protein: 6.3 g/dL (ref 6.0–8.3)

## 2016-08-08 LAB — CBC WITH DIFFERENTIAL/PLATELET
BASOS ABS: 0 10*3/uL (ref 0.0–0.1)
Basophils Relative: 0.8 % (ref 0.0–3.0)
EOS PCT: 8.7 % — AB (ref 0.0–5.0)
Eosinophils Absolute: 0.4 10*3/uL (ref 0.0–0.7)
HCT: 39.2 % (ref 39.0–52.0)
HEMOGLOBIN: 13 g/dL (ref 13.0–17.0)
Lymphocytes Relative: 25.6 % (ref 12.0–46.0)
Lymphs Abs: 1.2 10*3/uL (ref 0.7–4.0)
MCHC: 33.3 g/dL (ref 30.0–36.0)
MCV: 87.8 fl (ref 78.0–100.0)
MONOS PCT: 12.8 % — AB (ref 3.0–12.0)
Monocytes Absolute: 0.6 10*3/uL (ref 0.1–1.0)
NEUTROS PCT: 52.1 % (ref 43.0–77.0)
Neutro Abs: 2.5 10*3/uL (ref 1.4–7.7)
Platelets: 204 10*3/uL (ref 150.0–400.0)
RBC: 4.47 Mil/uL (ref 4.22–5.81)
RDW: 16.6 % — ABNORMAL HIGH (ref 11.5–15.5)
WBC: 4.8 10*3/uL (ref 4.0–10.5)

## 2016-08-08 LAB — LIPID PANEL
CHOLESTEROL: 134 mg/dL (ref 0–200)
HDL: 47 mg/dL (ref >39.00)
LDL Cholesterol: 54 mg/dL (ref 0–99)
NonHDL: 87.3
Total CHOL/HDL Ratio: 3
Triglycerides: 168 mg/dL — ABNORMAL HIGH (ref 0.0–149.0)
VLDL: 33.6 mg/dL (ref 0.0–40.0)

## 2016-08-08 LAB — POC URINALSYSI DIPSTICK (AUTOMATED)
Bilirubin, UA: NEGATIVE
Blood, UA: NEGATIVE
GLUCOSE UA: NEGATIVE
Ketones, UA: NEGATIVE
LEUKOCYTES UA: NEGATIVE
NITRITE UA: NEGATIVE
Spec Grav, UA: 1.02
UROBILINOGEN UA: 1
pH, UA: 6

## 2016-08-08 LAB — BASIC METABOLIC PANEL
BUN: 17 mg/dL (ref 6–23)
CALCIUM: 9.1 mg/dL (ref 8.4–10.5)
CO2: 31 mEq/L (ref 19–32)
Chloride: 105 mEq/L (ref 96–112)
Creatinine, Ser: 1.23 mg/dL (ref 0.40–1.50)
GFR: 60.91 mL/min (ref >60.00)
Glucose, Bld: 129 mg/dL — ABNORMAL HIGH (ref 70–99)
POTASSIUM: 4.8 meq/L (ref 3.5–5.1)
SODIUM: 143 meq/L (ref 135–145)

## 2016-08-08 LAB — TSH: TSH: 2.31 u[IU]/mL (ref 0.35–4.50)

## 2016-08-08 LAB — HEMOGLOBIN A1C: HEMOGLOBIN A1C: 6.9 % — AB (ref 4.6–6.5)

## 2016-08-08 LAB — MICROALBUMIN / CREATININE URINE RATIO
Creatinine,U: 142.8 mg/dL
MICROALB UR: 13.8 mg/dL — AB (ref 0.0–1.9)
MICROALB/CREAT RATIO: 9.7 mg/g (ref 0.0–30.0)

## 2016-08-08 LAB — PSA: PSA: 1.06 ng/mL (ref 0.10–4.00)

## 2016-08-09 ENCOUNTER — Encounter: Payer: Self-pay | Admitting: Adult Health

## 2016-08-09 ENCOUNTER — Ambulatory Visit (INDEPENDENT_AMBULATORY_CARE_PROVIDER_SITE_OTHER): Payer: Medicare Other | Admitting: Adult Health

## 2016-08-09 VITALS — BP 124/60 | Temp 98.2°F | Ht 71.0 in | Wt 241.8 lb

## 2016-08-09 DIAGNOSIS — E785 Hyperlipidemia, unspecified: Secondary | ICD-10-CM | POA: Diagnosis not present

## 2016-08-09 DIAGNOSIS — Z0001 Encounter for general adult medical examination with abnormal findings: Secondary | ICD-10-CM

## 2016-08-09 DIAGNOSIS — Z0000 Encounter for general adult medical examination without abnormal findings: Secondary | ICD-10-CM

## 2016-08-09 DIAGNOSIS — E1122 Type 2 diabetes mellitus with diabetic chronic kidney disease: Secondary | ICD-10-CM

## 2016-08-09 DIAGNOSIS — I10 Essential (primary) hypertension: Secondary | ICD-10-CM

## 2016-08-09 DIAGNOSIS — N182 Chronic kidney disease, stage 2 (mild): Secondary | ICD-10-CM

## 2016-08-09 DIAGNOSIS — E6609 Other obesity due to excess calories: Secondary | ICD-10-CM

## 2016-08-09 DIAGNOSIS — Z6833 Body mass index (BMI) 33.0-33.9, adult: Secondary | ICD-10-CM

## 2016-08-09 DIAGNOSIS — H6123 Impacted cerumen, bilateral: Secondary | ICD-10-CM | POA: Diagnosis not present

## 2016-08-09 NOTE — Progress Notes (Signed)
Subjective:    Patient ID: Charles [name], male    DOB: Jul 24, 1941, 75 y.o.   MRN: [id number]  HPI  Patient presents for yearly preventative medicine examination. He is a 75 year old male who  has a past medical history of Anemia; Arthritis; AVM (arteriovenous malformation) of colon; Biceps muscle tear; Cancer of kidney ([place]) (2000); Coronary artery disease; Eczema; GERD (gastroesophageal reflux disease); Hernia, ventral; Hyperlipidemia; Hypertension; Obesity; Personal history of adenomatous colonic polyps (07/20/2012); Pneumonia (1942; 1950s X 1); Type II diabetes mellitus ([place]); and Walking pneumonia (2000's X 1).   All immunizations and health maintenance protocols were reviewed with the patient and needed orders were placed.  Medication reconciliation,  past medical history, social history, problem list and allergies were reviewed in detail with the patient  Goals were established with regard to weight loss, exercise, and  diet in compliance with medications  End of life planning was discussed.  He reports aching in bilateral hips. He feels as though this is arthritic pain. He does not want to do anything about it now, states " When I feel like it is time to have them looked at then I will let you know."  His last colonoscopy was done in 2013. Even though he is 19, it is recommended he have a repeat next year due to colon polyps. He is up to date on vision and dental care.    Review of Systems  Constitutional: Negative.   HENT: Positive for hearing loss.   Eyes: Negative.   Respiratory: Negative.   Cardiovascular: Negative.   Gastrointestinal: Negative.   Endocrine: Negative.   Genitourinary: Negative.   Musculoskeletal: Positive for arthralgias.  Skin: Negative.   Allergic/Immunologic: Negative.   Neurological: Negative.   Hematological: Negative.   Psychiatric/Behavioral: Negative.   All other systems reviewed and are negative.  Past Medical History:  Diagnosis Date  .  Anemia    "when I was a lad"  . Arthritis    "right femur" (10/19/2015)  . AVM (arteriovenous malformation) of colon    2 - non-bleeding 2013  . Biceps muscle tear    right  . Cancer of kidney ([place]) 2000   left nephrectomy  . Coronary artery disease   . Eczema   . GERD (gastroesophageal reflux disease)   . Hernia, ventral   . Hyperlipidemia   . Hypertension   . Obesity   . Personal history of adenomatous colonic polyps 07/20/2012   3 + adenomas 2009 07/20/2012 2 diminutive polyps    . Pneumonia 1942; 1950s X 1  . Type II diabetes mellitus ([place])   . Walking pneumonia 2000's X 1    Social History   Social History  . Marital status: Widowed    Spouse name: N/A  . Number of children: N/A  . Years of education: N/A   Occupational History  . Not on file.   Social History Main Topics  . Smoking status: Former Smoker    Years: 20.00    Types: Cigarettes, Pipe, Cigars    Quit date: 07/06/2012  . Smokeless tobacco: Never Used  . Alcohol use 0.6 oz/week    1 Cans of beer per week  . Drug use: No  . Sexual activity: No   Other Topics Concern  . 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    Not married-  widowed    Three children, One in [place], one in [place] one in [place]    Past Surgical History:  Procedure Laterality Date  . CARDIAC CATHETERIZATION N/A 10/19/2015   Procedure: Left Heart Cath and Coronary Angiography;  Surgeon: Charolette Forward, MD;  Location: [facility];  Service: Cardiovascular;  Laterality: N/A;  . CARDIAC CATHETERIZATION N/A 10/19/2015   Procedure: Coronary Stent Intervention;  Surgeon: Charolette Forward, MD;  Location: [facility];  Service: Cardiovascular;  Laterality: N/A;  . COLONOSCOPY     "nothing showed up this time"  . COLONOSCOPY W/ BIOPSIES AND POLYPECTOMY  X 1  . CORONARY ANGIOPLASTY    . FEMUR IM NAIL Right  07/26/2013   Procedure: INTRAMEDULLARY (IM) NAIL FEMORAL subtrochanteric;  Surgeon: Mauri Pole, MD;  Location: [facility];  Service: Orthopedics;  Laterality: Right;  . FRACTURE SURGERY    . INGUINAL HERNIA REPAIR Left >3 times  . INGUINAL HERNIA REPAIR Right 2012  . NEPHRECTOMY Left 2000  . TONSILLECTOMY  1940s    Family History  Problem Relation Age of Onset  . Drug abuse Other   . Cancer Other   . Heart disease Other   . Lung disease Other   . Colon cancer Neg Hx   . Stomach cancer Neg Hx   . Diabetes Mother   . Diabetes Sister     No Known Allergies  Current Outpatient Prescriptions on File Prior to Visit  Medication Sig Dispense Refill  . aspirin 81 MG chewable tablet Chew 1 tablet (81 mg total) by mouth daily.    Marland Kitchen atorvastatin (LIPITOR) 40 MG tablet Take 1 tablet (40 mg total) by mouth daily at 6 PM. 30 tablet 3  . Cyanocobalamin (VITAMIN B-12 IJ) Inject 1 mL as directed every 30 (thirty) days.    Marland Kitchen EPINEPHrine (EPIPEN 2-PAK) 0.3 mg/0.3 mL DEVI Inject 0.3 mg into the muscle once.      Marland Kitchen glipiZIDE (GLUCOTROL) 5 MG tablet Take 0.5 tablets (2.5 mg total) by mouth 2 (two) times daily before a meal. 90 tablet 2  . lisinopril (PRINIVIL,ZESTRIL) 5 MG tablet Take 1 tablet (5 mg total) by mouth daily. 30 tablet 3  . metFORMIN (GLUCOPHAGE) 1000 MG tablet Take 1 tablet by mouth two  times daily with meals 360 tablet 3  . metoprolol succinate (TOPROL-XL) 50 MG 24 hr tablet     . Multiple Vitamins-Minerals (ICAPS PO) Take 1 tablet by mouth daily.    . nitroGLYCERIN (NITROSTAT) 0.4 MG SL tablet Place 1 tablet (0.4 mg total) under the tongue every 5 (five) minutes as needed for chest pain. 25 tablet 1  . ONETOUCH VERIO test strip USE ONE STRIP TO CHECK GLUCOSE ONCE DAILY 100 each 3  . ticagrelor (BRILINTA) 90 MG TABS tablet Take 1 tablet (90 mg total) by mouth 2 (two) times daily. 60 tablet 3   No current facility-administered medications on file prior to visit.     BP 124/60   Temp  98.2 F (36.8 C) (Oral)   Ht 5\' 11"  (1.803 m)   Wt 241 lb 12.8 oz (109.7 kg)   BMI 33.72 kg/m       Objective:   Physical Exam  Constitutional: He is oriented to person, place, and time. Vital signs are normal. He appears well-developed and well-nourished. No distress.  Obese   HENT:  Head: Normocephalic and atraumatic.  Right Ear: External ear normal.  Left Ear: External ear normal.  Nose: Nose normal.  Mouth/Throat:  Oropharynx is clear and moist. No oropharyngeal exudate.  Cerumen impaction in bilateral ear canal   Eyes: Conjunctivae are normal. Pupils are equal, round, and reactive to light. Right eye exhibits no discharge. Left eye exhibits no discharge.  Neck: Trachea normal. Carotid bruit is not present. No tracheal deviation present. No thyroid mass and no thyromegaly present.  Cardiovascular: Normal rate, regular rhythm, normal heart sounds and intact distal pulses.  Exam reveals no gallop and no friction rub.   No murmur heard. Pulmonary/Chest: Effort normal and breath sounds normal. No respiratory distress. He has no wheezes. He has no rales. He exhibits no tenderness.  Abdominal: Soft. Normal appearance and bowel sounds are normal. He exhibits no distension and no mass. There is no tenderness. There is no rebound and no guarding. A hernia is present. Hernia confirmed positive in the ventral area.  Genitourinary: Prostate normal. Rectal exam shows guaiac negative stool.  Musculoskeletal: Normal range of motion. He exhibits no edema, tenderness or deformity.  Lymphadenopathy:    He has no cervical adenopathy.  Neurological: He is alert and oriented to person, place, and time. He has normal reflexes. He displays normal reflexes. No cranial nerve deficit. He exhibits normal muscle tone. Coordination normal.  Skin: Skin is warm and dry. No rash noted. He is not diaphoretic. No erythema. No pallor.  Psychiatric: He has a normal mood and affect. His behavior is normal. Judgment  and thought content normal.  Nursing note and vitals reviewed.     Assessment & Plan:  1. Routine general medical examination at a health care facility - Reviewed labs in detail with patient. All questions answered.  - Encouraged a diabetic diet and frequent exercise  2. Essential hypertension BP Readings from Last 3 Encounters:  08/09/16 124/60  07/22/16 134/82  06/18/16 124/62  - Well controlled. No change in medication    3. Controlled type 2 diabetes mellitus with stage 2 chronic kidney disease, without long-term current use of insulin ([place]) Lab Results  Component Value Date   HGBA1C 6.9 (H) 08/08/2016  - Has increased from 6.4 - 3 months ago  - Encouraged diabetic diet and exercise.  - Follow up with in 3 months    4. Hyperlipidemia, unspecified hyperlipidemia type - Triglycerides have increased but over all well controlled with statin  - Encouraged healthy diet and exercise  5. Class 1 obesity due to excess calories with serious comorbidity and body mass index (BMI) of 33.0 to 33.9 in adult - Needs to lose weight through diet and exercise  6. Bilateral impacted cerumen - Cerumen was easily removed with irrigation.  -Patient endorsed improvement in hearing once cerumen was removed.  - No signs of infection   [name], NP

## 2016-08-09 NOTE — Patient Instructions (Signed)
It was great seeing you today!  As discussed your A1c has gone up to 6.9. Work on diet and exercise.   I would like you to follow up in three months for a recheck.   Please follow up with Charles [name] for your annual wellness exam. This is no cost to you but lets [place] find ways to keep you healthy.   Have a great holiday season!

## 2016-08-22 ENCOUNTER — Ambulatory Visit (INDEPENDENT_AMBULATORY_CARE_PROVIDER_SITE_OTHER): Payer: Medicare Other | Admitting: Family Medicine

## 2016-08-22 DIAGNOSIS — E538 Deficiency of other specified B group vitamins: Secondary | ICD-10-CM | POA: Diagnosis not present

## 2016-08-22 MED ORDER — CYANOCOBALAMIN 1000 MCG/ML IJ SOLN
1000.0000 ug | Freq: Once | INTRAMUSCULAR | Status: AC
  Administered 2016-08-22: 1000 ug via INTRAMUSCULAR

## 2016-08-23 ENCOUNTER — Ambulatory Visit: Payer: Medicare Other

## 2016-08-28 NOTE — Progress Notes (Signed)
[name] ,   I am sorry, it will not let me do that.    I agree to this B 12 injection.   Charles [name]

## 2016-09-24 ENCOUNTER — Ambulatory Visit (INDEPENDENT_AMBULATORY_CARE_PROVIDER_SITE_OTHER): Payer: Medicare Other

## 2016-09-24 DIAGNOSIS — E538 Deficiency of other specified B group vitamins: Secondary | ICD-10-CM

## 2016-09-24 MED ORDER — CYANOCOBALAMIN 1000 MCG/ML IJ SOLN
1000.0000 ug | Freq: Once | INTRAMUSCULAR | Status: AC
  Administered 2016-09-24: 1000 ug via INTRAMUSCULAR

## 2016-10-22 ENCOUNTER — Encounter: Payer: Self-pay | Admitting: Internal Medicine

## 2016-10-22 ENCOUNTER — Ambulatory Visit (INDEPENDENT_AMBULATORY_CARE_PROVIDER_SITE_OTHER): Payer: Medicare Other | Admitting: Internal Medicine

## 2016-10-22 VITALS — BP 132/78 | HR 83 | Ht 70.5 in | Wt 246.0 lb

## 2016-10-22 DIAGNOSIS — E538 Deficiency of other specified B group vitamins: Secondary | ICD-10-CM

## 2016-10-22 DIAGNOSIS — N182 Chronic kidney disease, stage 2 (mild): Secondary | ICD-10-CM

## 2016-10-22 DIAGNOSIS — E1122 Type 2 diabetes mellitus with diabetic chronic kidney disease: Secondary | ICD-10-CM | POA: Diagnosis not present

## 2016-10-22 LAB — VITAMIN B12: Vitamin B-12: 234 pg/mL (ref 211–911)

## 2016-10-22 LAB — POCT GLYCOSYLATED HEMOGLOBIN (HGB A1C): HEMOGLOBIN A1C: 6.6

## 2016-10-22 NOTE — Progress Notes (Signed)
Patient ID: Charles [name], male   DOB: 01-20-1941, 75 y.o.   MRN: [id number]  HPI: Charles [name] is a 76 y.o.-year-old male, returning for f/u for DM2 dx 2008, non-insulin-dependent, uncontrolled, with complications (CAD - s/p NSTEMI, PN, mild CKD). Last visit 3 mo ago.   [facility]   DM2: Last hemoglobin A1c was: Lab Results  Component Value Date   HGBA1C 6.9 (H) 08/08/2016   HGBA1C 6.2 07/22/2016   HGBA1C 6.4 (H) 04/19/2016  12/2013: 8.6%  Pt is on a regimen of: - Metformin 1000 mg po bid - Glipizide 2.5 mg 2x a day before meals - takes it every day now   Pt checks his sugars once a day: - am: 61, 75-120, 148 >>  104-140 >> 85-125 >> 88-140 >> 81-148, 169 >> 125-135, 210 if forgets meds - 2h after b'fast: 106-187 >> 78-116 >> n/c >> 68x1 >> n/c - before lunch: 79-90, 157 >> n/c - 2h after lunch: 123-176 >> n/c - before dinner: 106, 125 >> n/c >> 57, 106 >> n/c - 2h after dinner: 173-197 >> n/c - bedtime: 103, 115, 138 >> 125-135 No lows. Lowest sugar was  57 >> 81 >> 98; he has hypoglycemia awareness in the 60s.  Highest sugar was 200 (no meds, large dessert) >> 170 >> 210  - has mild CKD, last BUN/creatinine:  Lab Results  Component Value Date   BUN 17 08/08/2016   CREATININE 1.23 08/08/2016  of note, he had a left nephrectomy. He is on lisinopril 40. ACR in 03/2013: 5.3.  - He has HTG; last set of lipids: Lab Results  Component Value Date   CHOL 134 08/08/2016   HDL 47.00 08/08/2016   LDLCALC 54 08/08/2016   LDLDIRECT 139.8 07/08/2014   TRIG 168.0 (H) 08/08/2016   CHOLHDL 3 08/08/2016   - last eye exam was in 10/2015 - Dr Delman Cheadle. No DR.  - + numbness and tingling in his feet >> improved.  At last visit, we diagnosed B12 deficiency: Component     Latest Ref Rng 10/13/2015  Vitamin B12     211 - 911 pg/mL 94 (L)   We started B12 im - had 6 >> vitamin B12 still low 1 mo after the last inj: Component     Latest Ref Rng & Units  04/19/2016  Vitamin B12     211 - 946 pg/mL 217    At last visit, I recommended that he restarted inj's. He is back on inj's now >> he switched to get them from PCP as he had a copay if having them here. He just finished his second 6 mo period.  He was admitted with NSTEMI 10/18/2015. He had a stent.   ROS: Constitutional: no weight gain/loss, no fatigue, no subjective hyperthermia/hypothermia, no nocturia Eyes: no blurry vision, no xerophthalmia ENT: no sore throat, no nodules palpated in throat, no dysphagia/odynophagia, no hoarseness Cardiovascular: no CP/SOB/palpitations/leg swelling  Respiratory: no cough/SOB Gastrointestinal: no N/V/D/C Musculoskeletal: no muscle/joint aches Skin: no rash Neurological: no tremors/numbness/no tingling/dizziness  I reviewed pt's medications, allergies, PMH, social hx, family hx, and changes were documented in the history of present illness. Otherwise, unchanged from my initial visit note. Stopped Amiodarone, started Metoprolol.  PE: BP 132/78 (BP Location: Left Arm, Patient Position: Sitting)   Pulse 83   Ht 5' 10.5" (1.791 m)   Wt 246 lb (111.6 kg)   SpO2 97%   BMI 34.80  kg/m  Wt Readings from Last 3 Encounters:  10/22/16 246 lb (111.6 kg)  08/09/16 241 lb 12.8 oz (109.7 kg)  07/22/16 243 lb (110.2 kg)   Constitutional: overweight, in NAD Eyes: PERRLA, EOMI, no exophthalmos ENT: moist mucous membranes, no thyromegaly, no cervical lymphadenopathy Cardiovascular: tachycardia, RR, No MRG, + periankle edema  - mild - R>L Respiratory: CTA B Gastrointestinal: abdomen soft, NT, ND, BS+ Musculoskeletal: no deformities, strength intact in all 4 Skin: moist, warm, no rashes Neurological: no tremor with outstretched hands, DTR normal in all 4  ASSESSMENT: 1. DM2, non-insulin-dependent, now controlled, with complications - CAD - s/p NSTEMI (Dr. Terrence Dupont) - peripheral neuropathy - CKD  2. B12 def  PLAN:  1. Patient with long-standing, now  more controlled diabetes, on oral antidiabetic regimen. Sugars are more stable, less lows. He is taking Glipizide 2x a day now.  - he is aware he needs to lose weight, tells me he cannot follow the vegan diet. I recommended a mediterranean diet - advised to: Patient Instructions  Please continue: - Metformin 1000 mg 2x a day - Glipizide 2.5 mg 2x a day before meals (stay on the dose you are taking now)  Please stop at the lab.  Please return in 3-4 months with your sugar log.  - check HbA1c today >> 6.6% (better) - UTD with eye exams and flu shot - Return to clinic in 3-4 months with sugar log   2. PN - since he was on Metformin, we checked a B12 vitamin in 10/2015 >> very low >> he had 6 monthly B12 injections and then start po B12 - 5000 mcg daily >> B12 level still low  >> I suggested to continue the inj's >> he had another set of 6 >> will repeat B12 level today.  Office Visit on 10/22/2016  Component Date Value Ref Range Status  . Vitamin B-12 10/22/2016 234  211 - 911 pg/mL Final  . Hemoglobin A1C 10/22/2016 6.6   Final   Msg sent: Dear [name], It looks like 1 month after your B12 injection, your vitamin B12 level gets back to 200s. At this point, we can either continue the injections or we can maybe try a higher dose of the B12, 5000 mcg daily and recheck the level when you come back. Also, I have patients that do their injections at home, this is another option if this is feasible for you. Please let me know. Sincerely, Philemon Kingdom MD   Philemon Kingdom, MD PhD [facility] Endocrin[facility]

## 2016-10-22 NOTE — Patient Instructions (Addendum)
Patient Instructions  Please continue: - Metformin 1000 mg 2x a day - Glipizide 2.5 mg 2x a day before meals (stay on the dose you are taking now)  Please stop at the lab.  Please return in 3-4 months with your sugar log.

## 2016-10-25 ENCOUNTER — Other Ambulatory Visit: Payer: Self-pay | Admitting: Internal Medicine

## 2016-11-11 LAB — HM DIABETES EYE EXAM

## 2016-11-13 ENCOUNTER — Other Ambulatory Visit: Payer: Self-pay | Admitting: Adult Health

## 2016-11-13 ENCOUNTER — Encounter: Payer: Self-pay | Admitting: Internal Medicine

## 2016-11-13 ENCOUNTER — Other Ambulatory Visit: Payer: Self-pay

## 2016-11-15 ENCOUNTER — Other Ambulatory Visit: Payer: Self-pay | Admitting: Cardiology

## 2016-11-15 DIAGNOSIS — R079 Chest pain, unspecified: Secondary | ICD-10-CM

## 2016-11-22 ENCOUNTER — Encounter: Payer: Self-pay | Admitting: Internal Medicine

## 2016-11-25 ENCOUNTER — Other Ambulatory Visit: Payer: Self-pay

## 2016-11-25 MED ORDER — ONETOUCH DELICA LANCETS FINE MISC: 5 refills | Status: DC

## 2016-11-25 MED ORDER — GLUCOSE BLOOD VI STRP: ORAL_STRIP | 5 refills | Status: DC

## 2016-12-11 ENCOUNTER — Encounter (HOSPITAL_COMMUNITY)
Admission: RE | Admit: 2016-12-11 | Discharge: 2016-12-11 | Disposition: A | Discharge status: Home / Self Care | Payer: Medicare Other | Source: Ambulatory Visit | Attending: Cardiology | Admitting: Cardiology

## 2016-12-11 DIAGNOSIS — R079 Chest pain, unspecified: Secondary | ICD-10-CM | POA: Insufficient documentation

## 2016-12-11 MED ORDER — TECHNETIUM TC 99M TETROFOSMIN IV KIT
10.0000 | PACK | Freq: Once | INTRAVENOUS | Status: AC | PRN
Start: 2016-12-11 — End: 2016-12-11
  Administered 2016-12-11: 10 via INTRAVENOUS

## 2016-12-11 MED ORDER — TECHNETIUM TC 99M TETROFOSMIN IV KIT
30.0000 | PACK | Freq: Once | INTRAVENOUS | Status: AC | PRN
  Administered 2016-12-11: 30 via INTRAVENOUS

## 2016-12-11 MED ORDER — REGADENOSON 0.4 MG/5ML IV SOLN
INTRAVENOUS | Status: AC
  Administered 2016-12-11: 0.4 mg via INTRAVENOUS
  Filled 2016-12-11: qty 5

## 2016-12-11 MED ORDER — REGADENOSON 0.4 MG/5ML IV SOLN
0.4000 mg | Freq: Once | INTRAVENOUS | Status: AC
  Administered 2016-12-11: 0.4 mg via INTRAVENOUS

## 2016-12-31 ENCOUNTER — Ambulatory Visit (INDEPENDENT_AMBULATORY_CARE_PROVIDER_SITE_OTHER): Payer: Medicare Other | Admitting: Adult Health

## 2016-12-31 ENCOUNTER — Encounter: Payer: Self-pay | Admitting: Adult Health

## 2016-12-31 VITALS — BP 128/72 | Temp 98.6°F | Ht 70.5 in | Wt 248.4 lb

## 2016-12-31 DIAGNOSIS — M25512 Pain in left shoulder: Secondary | ICD-10-CM

## 2016-12-31 NOTE — Progress Notes (Signed)
Subjective:    Patient ID: Charles [name], male    DOB: 29-Jul-1941, 76 y.o.   MRN: [id number]  HPI  76 year old male who  has a past medical history of Anemia; Arthritis; AVM (arteriovenous malformation) of colon; Biceps muscle tear; Cancer of kidney ([place]) (2000); Coronary artery disease; Eczema; GERD (gastroesophageal reflux disease); Hernia, ventral; Hyperlipidemia; Hypertension; Obesity; Personal history of adenomatous colonic polyps (07/20/2012); Pneumonia (1942; 1950s X 1); Type II diabetes mellitus ([place]); and Walking pneumonia (2000's X 1). He presents to the office today for the complaint of left shoulder pain x 2 months. He believes he injured his shoulder while carrying heavy items at home.   He reports pain in left shoulder with certain movements such as putting on his shirt in the morning. He gets a " twinge" in her left shoulder. Denies any redness or swelling to the area.   Denies any loss of grip strength and very little loss of ROM.   He has been using a heating pad to help with the pain   Review of Systems See HPI   Past Medical History:  Diagnosis Date  . Anemia    "when I was a lad"  . Arthritis    "right femur" (10/19/2015)  . AVM (arteriovenous malformation) of colon    2 - non-bleeding 2013  . Biceps muscle tear    right  . Cancer of kidney ([place]) 2000   left nephrectomy  . Coronary artery disease   . Eczema   . GERD (gastroesophageal reflux disease)   . Hernia, ventral   . Hyperlipidemia   . Hypertension   . Obesity   . Personal history of adenomatous colonic polyps 07/20/2012   3 + adenomas 2009 07/20/2012 2 diminutive polyps    . Pneumonia 1942; 1950s X 1  . Type II diabetes mellitus ([place])   . Walking pneumonia 2000's X 1    Social History   Social History  . Marital status: Widowed    Spouse name: N/A  . Number of children: N/A  . Years of education: N/A   Occupational History  . Not on file.   Social History Main Topics  . Smoking  status: Former Smoker    Years: 20.00    Types: Cigarettes, Pipe, Cigars    Quit date: 07/06/2012  . Smokeless tobacco: Never Used  . Alcohol use 0.6 oz/week    1 Cans of beer per week  . Drug use: No  . Sexual activity: No   Other Topics Concern  . 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    Not married- widowed    Three children, One in [place], one in [place] one in [place]    Past Surgical History:  Procedure Laterality Date  . CARDIAC CATHETERIZATION N/A 10/19/2015   Procedure: Left Heart Cath and Coronary Angiography;  Surgeon: Charolette Forward, MD;  Location: [facility];  Service: Cardiovascular;  Laterality: N/A;  . CARDIAC CATHETERIZATION N/A 10/19/2015   Procedure: Coronary Stent Intervention;  Surgeon: Charolette Forward, MD;  Location: [facility];  Service: Cardiovascular;  Laterality: N/A;  . COLONOSCOPY     "nothing showed up this time"  . COLONOSCOPY W/ BIOPSIES AND POLYPECTOMY  X 1  . CORONARY ANGIOPLASTY    . FEMUR IM NAIL Right 07/26/2013   Procedure: INTRAMEDULLARY (IM)  NAIL FEMORAL subtrochanteric;  Surgeon: Mauri Pole, MD;  Location: [facility];  Service: Orthopedics;  Laterality: Right;  . FRACTURE SURGERY    . INGUINAL HERNIA REPAIR Left >3 times  . INGUINAL HERNIA REPAIR Right 2012  . NEPHRECTOMY Left 2000  . TONSILLECTOMY  1940s    Family History  Problem Relation Age of Onset  . Drug abuse Other   . Cancer Other   . Heart disease Other   . Lung disease Other   . Colon cancer Neg Hx   . Stomach cancer Neg Hx   . Diabetes Mother   . Diabetes Sister     No Known Allergies  Current Outpatient Prescriptions on File Prior to Visit  Medication Sig Dispense Refill  . aspirin 81 MG chewable tablet Chew 1 tablet (81 mg total) by mouth daily.    Marland Kitchen atorvastatin (LIPITOR) 40 MG tablet Take 1 tablet (40 mg total) by mouth  daily at 6 PM. 30 tablet 3  . Cyanocobalamin (VITAMIN B-12 IJ) Inject 1 mL as directed every 30 (thirty) days.    Marland Kitchen EPINEPHrine (EPIPEN 2-PAK) 0.3 mg/0.3 mL DEVI Inject 0.3 mg into the muscle once.      Marland Kitchen glipiZIDE (GLUCOTROL) 5 MG tablet Take 0.5 tablets (2.5 mg total) by mouth 2 (two) times daily before a meal. 90 tablet 2  . glucose blood (ONETOUCH VERIO) test strip USE ONE STRIP TO CHECK GLUCOSE ONCE DAILY 100 each 5  . lisinopril (PRINIVIL,ZESTRIL) 5 MG tablet Take 1 tablet (5 mg total) by mouth daily. 30 tablet 3  . metFORMIN (GLUCOPHAGE) 1000 MG tablet Take 1 tablet by mouth two  times daily with meals 360 tablet 3  . metoprolol succinate (TOPROL-XL) 50 MG 24 hr tablet     . Multiple Vitamins-Minerals (ICAPS PO) Take 1 tablet by mouth daily.    . nitroGLYCERIN (NITROSTAT) 0.4 MG SL tablet Place 1 tablet (0.4 mg total) under the tongue every 5 (five) minutes as needed for chest pain. 25 tablet 1  . ONETOUCH DELICA LANCETS FINE MISC Use to check sugar daily 100 each 5  . ticagrelor (BRILINTA) 90 MG TABS tablet Take 1 tablet (90 mg total) by mouth 2 (two) times daily. 60 tablet 3   No current facility-administered medications on file prior to visit.     BP 128/72 (BP Location: Left Arm, Patient Position: Sitting, Cuff Size: Normal)   Temp 98.6 F (37 C) (Oral)   Ht 5' 10.5" (1.791 m)   Wt 248 lb 6.4 oz (112.7 kg)   BMI 35.14 kg/m       Objective:   Physical Exam  Constitutional: He is oriented to person, place, and time. He appears well-developed and well-nourished. No distress.  Cardiovascular: Normal rate, regular rhythm, normal heart sounds and intact distal pulses.  Exam reveals no gallop and no friction rub.   No murmur heard. Pulmonary/Chest: Effort normal and breath sounds normal. No respiratory distress. He has no wheezes. He has no rales. He exhibits no tenderness.  Musculoskeletal: He exhibits tenderness (to left shoulder ).  Is able to raise arm slowly over head -  appears in pain doing this. Also appears in discomfort with resistance exercises.   No swelling or redness noted. No decrease in grip strength   Neurological: He is alert and oriented to person, place, and time.  Skin: Skin is warm and dry. No rash noted. He is not diaphoretic. No erythema. No pallor.  Psychiatric: He has a normal mood and  affect. His behavior is normal. Judgment and thought content normal.  Nursing note and vitals reviewed.     Assessment & Plan:  1. Acute pain of left shoulder - Possible rotator cuff tear. Does not appear to be bursitis.  - Continue with heating pad and can take motrin  - Ambulatory referral to Sports Medicine - Follow up if symptoms become worse   [name], NP

## 2017-01-24 ENCOUNTER — Ambulatory Visit: Payer: Self-pay

## 2017-01-24 ENCOUNTER — Ambulatory Visit (INDEPENDENT_AMBULATORY_CARE_PROVIDER_SITE_OTHER): Payer: Medicare Other | Admitting: Family Medicine

## 2017-01-24 ENCOUNTER — Encounter: Payer: Self-pay | Admitting: Family Medicine

## 2017-01-24 VITALS — BP 132/82 | HR 97 | Ht 70.5 in | Wt 252.0 lb

## 2017-01-24 DIAGNOSIS — M19012 Primary osteoarthritis, left shoulder: Secondary | ICD-10-CM

## 2017-01-24 DIAGNOSIS — M12812 Other specific arthropathies, not elsewhere classified, left shoulder: Secondary | ICD-10-CM | POA: Diagnosis not present

## 2017-01-24 DIAGNOSIS — M25512 Pain in left shoulder: Secondary | ICD-10-CM

## 2017-01-24 DIAGNOSIS — M19019 Primary osteoarthritis, unspecified shoulder: Secondary | ICD-10-CM | POA: Insufficient documentation

## 2017-01-24 NOTE — Patient Instructions (Signed)
Good to see you.  Ice 20 minutes 2 times daily. Usually after activity and before bed. Exercises 3 times a week.  Charles [name] would be great  Keep hands within peripheral vision with all activities.  Vitamin D 2000IU dialy  Tart cherry extract at night any dose Turmeric 500mg  daily  For the back also try vitamin C 500mg  with every meal.  Sleep with pillow between knees Flip mattress.  See me again in 4 weeks.

## 2017-01-24 NOTE — Assessment & Plan Note (Signed)
Consider injection at follow up  Keep hands within peripheral vision and under 90 degrees.

## 2017-01-24 NOTE — Progress Notes (Signed)
[name] [name], [name] [address] Phone: [phone] Subjective:    I'm seeing this patient by the request  of:  [name], NP   CC: Left shoulder pain  [id number]  Charles [name] is a 76 y.o. male coming in with complaint of *left shoulder pain. Been going on 2 months. Patient did do some renovations on his [place] had to move a lot of clothing. When he did this he felt a sharp pain in the left shoulder. Now an intermittent pain with certain movements. States that it can be severe. Rates the severity is 8 out of 10 but only last seconds. Did see a physical therapist who referred him here. Patient also saw primary care provider. Has not tried many over-the-counter medications. Sleeping comfortably at night. No weakness.    Past Medical History:  Diagnosis Date  . Anemia    "when I was a lad"  . Arthritis    "right femur" (10/19/2015)  . AVM (arteriovenous malformation) of colon    2 - non-bleeding 2013  . Biceps muscle tear    right  . Cancer of kidney ([place]) 2000   left nephrectomy  . Coronary artery disease   . Eczema   . GERD (gastroesophageal reflux disease)   . Hernia, ventral   . Hyperlipidemia   . Hypertension   . Obesity   . Personal history of adenomatous colonic polyps 07/20/2012   3 + adenomas 2009 07/20/2012 2 diminutive polyps    . Pneumonia 1942; 1950s X 1  . Type II diabetes mellitus ([place])   . Walking pneumonia 2000's X 1   Past Surgical History:  Procedure Laterality Date  . CARDIAC CATHETERIZATION N/A 10/19/2015   Procedure: Left Heart Cath and Coronary Angiography;  Surgeon: Charolette Forward, MD;  Location: [facility];  Service: Cardiovascular;  Laterality: N/A;  . CARDIAC CATHETERIZATION N/A 10/19/2015   Procedure: Coronary Stent Intervention;  Surgeon: Charolette Forward, MD;  Location: [facility];  Service: Cardiovascular;  Laterality: N/A;  . COLONOSCOPY     "nothing showed up this time"  .  COLONOSCOPY W/ BIOPSIES AND POLYPECTOMY  X 1  . CORONARY ANGIOPLASTY    . FEMUR IM NAIL Right 07/26/2013   Procedure: INTRAMEDULLARY (IM) NAIL FEMORAL subtrochanteric;  Surgeon: Mauri Pole, MD;  Location: [facility];  Service: Orthopedics;  Laterality: Right;  . FRACTURE SURGERY    . INGUINAL HERNIA REPAIR Left >3 times  . INGUINAL HERNIA REPAIR Right 2012  . NEPHRECTOMY Left 2000  . TONSILLECTOMY  1940s   Social History   Social History  . Marital status: Widowed    Spouse name: N/A  . Number of children: N/A  . Years of education: N/A   Social History Main Topics  . Smoking status: Former Smoker    Years: 20.00    Types: Cigarettes, Pipe, Cigars    Quit date: 07/06/2012  . Smokeless tobacco: Never Used  . Alcohol use 0.6 oz/week    1 Cans of beer per week  . Drug use: No  . Sexual activity: No   Other Topics Concern  . 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    Not married- widowed    Three children, One in [place], one in [place] one in [place]   No Known Allergies  Family History  Problem Relation Age of Onset  . Drug abuse Other   . Cancer Other   . Heart disease Other   . Lung disease Other   . Colon cancer Neg Hx   . Stomach cancer Neg Hx   . Diabetes Mother   . Diabetes Sister     Past medical history, social, surgical and family history all reviewed in electronic medical record.  No pertanent information unless stated regarding to the chief complaint.   Review of Systems:Review of systems updated and as accurate as of 01/24/17  No headache, visual changes, nausea, vomiting, diarrhea, constipation, dizziness, abdominal pain, skin rash, fevers, chills, night sweats, weight loss, swollen lymph nodes, body aches, joint swelling, muscle aches, chest pain, shortness of breath, mood changes.   Objective  Height 5' 10.5" (1.791 m), weight  252 lb (114.3 kg). Systems examined below as of 01/24/17   General: No apparent distress alert and oriented x3 mood and affect normal, dressed appropriately.  HEENT: Pupils equal, extraocular movements intact  Respiratory: Patient's speak in full sentences and does not appear short of breath  Cardiovascular: No lower extremity edema, non tender, no erythema  Skin: Warm dry intact with no signs of infection or rash on extremities or on axial skeleton.  Abdomen: Soft nontender  Neuro: Cranial nerves II through XII are intact, neurovascularly intact in all extremities with 2+ DTRs and 2+ pulses.  Lymph: No lymphadenopathy of posterior or anterior cervical chain or axillae bilaterally.  Gait normal with good balance and coordination.  MSK:  Non tender with full range of motion and good stability and symmetric strength and tone of elbows, wrist, hip, knee and ankles bilaterally.  Shoulder: left Inspection reveals no abnormalities, atrophy or asymmetry. Pain over the acromial clavicular joint Ascent full range of motion but does have pain Rotator cuff strength normal throughout. signs of impingement with positive Neer and Hawkin's tests, but negative empty can sign. Speeds and Yergason's tests normal. No labral pathology noted with negative Obrien's, negative clunk and good stability. Normal scapular function observed. No painful arc and no drop arm sign. No apprehension sign  MSK US performed of: left This study was ordered, performed, and interpreted by Charlann Boxer D.O.  Shoulder:   Supraspinatus:  Partial tear noted Bursal bulge seen with shoulder abduction on impingement view. Subscapularis:  Appears normal on long and transverse views. Positive bursa moderate underlying arthritis . AC joint:  Severe arthritis Glenohumeral Joint:  Mild to moderate arthritis Glenoid Labrum:  Intact without visualized tears. Biceps Tendon:  A mild hypoechoic changes in the tendon sheath  Impression:  Subacromial bursitis, partial non-retracted rotator cuff tear, underlying acromioclavicular glenohumeral arthritis     Impression and Recommendations:     This case required medical decision making of moderate complexity.      Note: This dictation was prepared with Dragon dictation along with smaller phrase technology. Any transcriptional errors that result from this process are unintentional.

## 2017-01-24 NOTE — Assessment & Plan Note (Signed)
Patient does have some rotator cuff arthropathy. Still has good strength. Patient given home exercises and will start with formal physical therapy. We discussed icing regimen, over-the-counter medications, proper lifting mechanics. Patient will follow-up in 4 weeks. Worsening symptoms we'll consider injections.

## 2017-02-17 ENCOUNTER — Ambulatory Visit (INDEPENDENT_AMBULATORY_CARE_PROVIDER_SITE_OTHER): Payer: Medicare Other | Admitting: Internal Medicine

## 2017-02-17 ENCOUNTER — Encounter: Payer: Self-pay | Admitting: Internal Medicine

## 2017-02-17 VITALS — BP 124/82 | HR 85 | Ht 71.0 in | Wt 250.0 lb

## 2017-02-17 DIAGNOSIS — N182 Chronic kidney disease, stage 2 (mild): Secondary | ICD-10-CM

## 2017-02-17 DIAGNOSIS — E1122 Type 2 diabetes mellitus with diabetic chronic kidney disease: Secondary | ICD-10-CM | POA: Diagnosis not present

## 2017-02-17 DIAGNOSIS — E538 Deficiency of other specified B group vitamins: Secondary | ICD-10-CM

## 2017-02-17 LAB — POCT GLYCOSYLATED HEMOGLOBIN (HGB A1C): Hemoglobin A1C: 6.9

## 2017-02-17 NOTE — Patient Instructions (Addendum)
Please continue: - Metformin 1000 mg 2x a day - Glipizide 2.5 mg 2x a day before meals  Please check with your insurance if they cover: - Jardiance - (Farxiga) - Invokana  - Steglatro  Please return in 3-4 months with your sugar log.  Please come back for labs fasting.

## 2017-02-17 NOTE — Progress Notes (Signed)
Patient ID: Charles [name], male   DOB: [birth year], 76 y.o.   MRN: [id number]  HPI: Charles [name] is a 76 y.o.-year-old male, returning for f/u for DM2 dx 2008, non-insulin-dependent, uncontrolled, with complications (CAD - s/p NSTEMI, PN, mild CKD). Last visit 4 mo ago.   [facility].  He started Metoprolol in 10/2016.  DM2: Last hemoglobin A1c was: Lab Results  Component Value Date   HGBA1C 6.6 10/22/2016   HGBA1C 6.9 (H) 08/08/2016   HGBA1C 6.2 07/22/2016  12/2013: 8.6%  Pt is on a regimen of: - Metformin 1000 mg po bid - Glipizide 2.5 mg 2x a day before meals   Pt checks his sugars 1x a day: - am:  81-148, 169 >> 125-135, 210 if forgets meds >> 110-150 - 2h after b'fast: 106-187 >> 78-116 >> n/c >> 68x1 >> n/c - before lunch: 79-90, 157 >> n/c - 2h after lunch: 123-176 >> n/c - before dinner: 106, 125 >> n/c >> 57, 106 >> n/c - 2h after dinner: 173-197 >> n/c - bedtime: 103, 115, 138 >> 125-135 >> 110-150 No lows. Lowest sugar was  57 >> 81 >> 98 >> ; he has hypoglycemia awareness in the 60s.  Highest sugar was 200 (no meds, large dessert) >> 170 >> 210 >> 204.  - has mild CKD, last BUN/creatinine:  Lab Results  Component Value Date   BUN 17 08/08/2016   CREATININE 1.23 08/08/2016  Of note, he had a nephrectomies. He is on Lisinopril. ACR in 03/2013: 5.3.  - He has HTG; last set of lipids: Lab Results  Component Value Date   CHOL 134 08/08/2016   HDL 47.00 08/08/2016   LDLCALC 54 08/08/2016   LDLDIRECT 139.8 07/08/2014   TRIG 168.0 (H) 08/08/2016   CHOLHDL 3 08/08/2016   - last eye exam was in 10/2015 - Dr Delman Cheadle. No DR.  - + numbness and tingling in his feet >> improved.  At last visit, we diagnosed B12 deficiency: Component     Latest Ref Rng 10/13/2015  Vitamin B12     211 - 911 pg/mL 94 (L)   We started B12 im - had 6 >> vitamin B12 still low 1 mo after the last inj: Component     Latest Ref Rng & Units 04/19/2016  Vitamin B12  211 - 946 pg/mL 217    Now on 5000 mcg daily B12 po.  He was admitted with NSTEMI 10/18/2015. He had a stent.   ROS: Constitutional: no weight gain/no weight loss, no fatigue, no subjective hyperthermia, no subjective hypothermia Eyes: no blurry vision, no xerophthalmia ENT: no sore throat, no nodules palpated in throat, no dysphagia, no odynophagia, no hoarseness Cardiovascular: no CP/no SOB/no palpitations/no leg swelling Respiratory: no cough/no SOB/no wheezing Gastrointestinal: no N/no V/no D/no C/no acid reflux Musculoskeletal: no muscle aches/no joint aches Skin: no rashes, no hair loss Neurological: no tremors/no numbness/no tingling/no dizziness  I reviewed pt's medications, allergies, PMH, social hx, family hx, and changes were documented in the history of present illness. Otherwise, unchanged from my initial visit note.  PE: BP 124/82 (BP Location: Left Arm, Patient Position: Sitting)   Pulse 85   Ht 5\' 11"  (1.803 m)   Wt 250 lb (113.4 kg)   SpO2 96%   BMI 34.87 kg/m  Wt Readings from Last 3 Encounters:  02/17/17 250 lb (113.4 kg)  01/24/17 252 lb (114.3 kg)  12/31/16 248 lb 6.4 oz (112.7  kg)   Constitutional: overweight, in NAD Eyes: PERRLA, EOMI, no exophthalmos ENT: moist mucous membranes, no thyromegaly, no cervical lymphadenopathy Cardiovascular: tachycardia, RR, No MRG, + periankle edema Respiratory: CTA B Gastrointestinal: abdomen soft, NT, ND, BS+ Musculoskeletal: no deformities, strength intact in all 4 Skin: moist, warm, no rashes Neurological: no tremor with outstretched hands, DTR normal in all 4  ASSESSMENT: 1. DM2, non-insulin-dependent, now controlled, with complications - CAD - s/p NSTEMI (Dr. Terrence Dupont) - peripheral neuropathy - CKD  2. B12 def  PLAN:  1. Patient with long-standing, now more controlled diabetes, on oral antidiabetic med. Sugars are higher in am, he believes after he starred Metoprolol. We discussed about starting an SGLT2  inh >> I asked him to check with his insurance. He had some lows after exercise >> I explained that Glipizide can drop his sugars and Jardiance for e.g. Can help with both diabetes and heart ds.  - advised to: Patient Instructions  Please continue: - Metformin 1000 mg 2x a day - Glipizide 2.5 mg 2x a day before meals  Please check with your insurance if they cover: - Jardiance - (Farxiga) - Invokana  - Steglatro  Please return in 3-4 months with your sugar log.  Please come back for labs fasting.  - check HbA1c today >> 6.9% (slightly higher) - continue checking sugars at different times of the day - check 1x a day, rotating checks - advised for yearly eye exams >> he  Needs one - Dr Terrence Dupont would like CMP and Lipids checked >> will add this at this visit >> he will come back to have them done fasting - Return to clinic in 3 mo with sugar log   2. B12 def. - since he was on Metformin, we checked a B12 vitamin in 10/2015 >> very low >> he had monthly B12 injections >> now on 12 po 5000 mcg >> will check a new level  Component     Latest Ref Rng & Units 02/18/2017          Sodium     135 - 146 mmol/L 140  Potassium     3.5 - 5.3 mmol/L 5.2  Chloride     98 - 110 mmol/L 103  CO2     20 - 31 mmol/L 28  Glucose     65 - 99 mg/dL 120 (H)  BUN     7 - 25 mg/dL 19  Creatinine     0.70 - 1.18 mg/dL 1.21 (H)  Total Bilirubin     0.2 - 1.2 mg/dL 0.5  Alkaline Phosphatase     40 - 115 U/L 47  AST     10 - 35 U/L 25  ALT     9 - 46 U/L 21  Total Protein     6.1 - 8.1 g/dL 6.4  Albumin     3.6 - 5.1 g/dL 3.9  Calcium     8.6 - 10.3 mg/dL 8.8  GFR, Est African American     >=60 mL/min 67  GFR, Est Non African American     >=60 mL/min 58 (L)  Cholesterol     0 - 200 mg/dL 118  Triglycerides     0.0 - 149.0 mg/dL 118.0  HDL Cholesterol     >39.00 mg/dL 42.00  VLDL     0.0 - 40.0 mg/dL 23.6  LDL (calc)     0 - 99 mg/dL 52  Total CHOL/HDL Ratio      3  NonHDL       75.79  Vitamin B12     211 - 911 pg/mL 1,249 (H)   All labs are improved. We can continue with his current B12 po dose.  Charles Kingdom, MD PhD [facility]

## 2017-02-18 ENCOUNTER — Other Ambulatory Visit (INDEPENDENT_AMBULATORY_CARE_PROVIDER_SITE_OTHER): Payer: Medicare Other

## 2017-02-18 DIAGNOSIS — E1122 Type 2 diabetes mellitus with diabetic chronic kidney disease: Secondary | ICD-10-CM

## 2017-02-18 DIAGNOSIS — N182 Chronic kidney disease, stage 2 (mild): Secondary | ICD-10-CM | POA: Diagnosis not present

## 2017-02-18 DIAGNOSIS — E538 Deficiency of other specified B group vitamins: Secondary | ICD-10-CM

## 2017-02-18 LAB — COMPLETE METABOLIC PANEL WITH GFR
ALT: 21 U/L (ref 9–46)
AST: 25 U/L (ref 10–35)
Albumin: 3.9 g/dL (ref 3.6–5.1)
Alkaline Phosphatase: 47 U/L (ref 40–115)
BUN: 19 mg/dL (ref 7–25)
CALCIUM: 8.8 mg/dL (ref 8.6–10.3)
CHLORIDE: 103 mmol/L (ref 98–110)
CO2: 28 mmol/L (ref 20–31)
CREATININE: 1.21 mg/dL — AB (ref 0.70–1.18)
GFR, Est African American: 67 mL/min (ref >=60)
GFR, Est Non African American: 58 mL/min — ABNORMAL LOW (ref >=60)
GLUCOSE: 120 mg/dL — AB (ref 65–99)
POTASSIUM: 5.2 mmol/L (ref 3.5–5.3)
SODIUM: 140 mmol/L (ref 135–146)
Total Bilirubin: 0.5 mg/dL (ref 0.2–1.2)
Total Protein: 6.4 g/dL (ref 6.1–8.1)

## 2017-02-18 LAB — LIPID PANEL
CHOL/HDL RATIO: 3
Cholesterol: 118 mg/dL (ref 0–200)
HDL: 42 mg/dL (ref >39.00)
LDL CALC: 52 mg/dL (ref 0–99)
NONHDL: 75.79
Triglycerides: 118 mg/dL (ref 0.0–149.0)
VLDL: 23.6 mg/dL (ref 0.0–40.0)

## 2017-02-18 LAB — VITAMIN B12: VITAMIN B 12: 1249 pg/mL — AB (ref 211–911)

## 2017-05-20 ENCOUNTER — Ambulatory Visit: Payer: Medicare Other | Admitting: Internal Medicine

## 2017-05-28 ENCOUNTER — Telehealth: Payer: Self-pay | Admitting: Adult Health

## 2017-05-28 NOTE — Telephone Encounter (Signed)
Pt has AWV scheduled for 9/25 - called pt to reschedule [facility] coach has to leave early for appt )  - left voicemail 9/19

## 2017-05-30 ENCOUNTER — Encounter: Payer: Self-pay | Admitting: Adult Health

## 2017-06-03 ENCOUNTER — Ambulatory Visit: Payer: Medicare Other

## 2017-06-06 ENCOUNTER — Ambulatory Visit (INDEPENDENT_AMBULATORY_CARE_PROVIDER_SITE_OTHER): Payer: Medicare Other

## 2017-06-06 ENCOUNTER — Telehealth: Payer: Self-pay

## 2017-06-06 DIAGNOSIS — Z23 Encounter for immunization: Secondary | ICD-10-CM

## 2017-06-06 DIAGNOSIS — Z0000 Encounter for general adult medical examination without abnormal findings: Secondary | ICD-10-CM | POA: Diagnosis not present

## 2017-06-06 NOTE — Progress Notes (Signed)
I have reviewed and agree with this plan  

## 2017-06-06 NOTE — Patient Instructions (Addendum)
Charles [name] , Thank you for taking time to come for your Medicare Wellness Visit. I appreciate your ongoing commitment to your health goals. Please review the following plan we discussed and let me know if I can assist you in the future.   Medicare pays for a hearing screen You can call [employer] and ask area which does hearing screens  Shingrix is a vaccine for the prevention of Shingles in Adults 50 and older.  If you are on Medicare, you can request a prescription from your doctor to be filled at a pharmacy.  Please check with your benefits regarding applicable copays or out of pocket expenses.  The Shingrix is given in 2 vaccines approx 8 weeks apart. You must receive the 2nd dose prior to 6 months from receipt of the first.     These are the goals we discussed: Goals    . patient          Will attempt portion control and reduce the calories taken in  Sometimes you can ask for a box to go and separate food out prior to eating        This is a list of the screening recommended for you and due dates:  Health Maintenance  Topic Date Due  . Complete foot exam   09/28/2015  . Eye exam for diabetics  10/29/2016  . Flu Shot  04/09/2017  . Colon Cancer Screening  07/20/2017  . Hemoglobin A1C  08/19/2017  . Tetanus Vaccine  02/19/2022  . Pneumonia vaccines  Completed    Manufacturing engineer of Services Cost  A Matter of Balance Class locations vary. Call [place] on Aging for more information.  [email or web address]-[email or web address]/ [phone] 8-Session program addressing the fear of falling and increasing activity levels of older adults Free to minimal cost  A.C.T. By [employer] [address], [place], [place] [address].  BetaBlues.[email or web address] [phone]  Personal training, gym, classes including Silver Sneakers* and ACTion for Aging Adults Fee-based  A.H.O.Y. (Add Health to [place]) Airs on Time [employer] 13, M-F at [place]: [employer],  [place] [facility] [place],  [place], [place] [facility], [address] [facility], [place], [place], [place] [address]  [place] Location: Sharrell Ku. [employer] [place]      [phone]  [phone]  [phone]  [phone]  [phone]  [phone]  [phone]  [phone]  [phone]  [phone]    [phone] A total-body conditioning class for adults 5 and older; designed to increase muscular strength, endurance, range of movement, flexibility, balance, agility and coordination Free  [facility] [place], [place] [address] [place]      [address]      [phone]      Pilate's class for individualsreturning to exercise after an injury, before or after surgery or for individuals with complex musculoskeletal issues; designed to improve strength, balance , flexibility      $15/class  [place] [address] [place], [place] [address] [email or web address] [place] classes for  beginners to advanced [employer] [place] [facility] [place], [place] [address] [email or web address] [place] Chair Exercises Free, ages 39 and older; Ages 3-59 fee based  Marvia Pickles, [facility] [address] [place], [place] [address] [email or web address] .Beverlee Nims [phone]  A.H.O.Y. Tai Chi Fee-based Donation based  or free  [place] Class locations vary.  Call or email Angela Burke or view website for more information. [email or web address] .[email or web address] [email or web address] [phone] Ongoing classes at local YMC[employer] and gyms Fee-based  Silver Sneakers A.C.T. By [place] [employer]: [place] [phone]  [phone] Classes designed for older adults who want to improve their strength, flexibility, balance and endurance.   Silver sneakers is covered by some insurance plans and includes a fitness center membership at participating locations. Find out more by calling [phone] or visiting [email or web address] Covered by some insurance plans  [facility] [place] [phone] A.H.O.Y., fitness room, personal training, fitness classes for injury prevention, strength, balance, flexibility, water fitness classes Ages 55+: $47 for 6 months; Ages 11-54: $64 for 6 months  Tai Chi for Everybody [facility] [address] [place], [place] [address] [email or web address] .Patsi Sears [phone] Tai Chi classes for beginners to advanced; geared for seniors Donation Based      [employer] ([place] Others Participate in Exercise     Loyal Gambler. Rosana Hoes, PhD, [place] [email or web address] [place]     [phone]     A comprehensive fitness program for adults.  The program paris senior-level undergraduates Kinesiology students with adults who desire to learn how to exercise safely.  Includes a structural exercise class focusing on functional fitnesss     $100/semester in fall and spring; $75 in summer  (no trainers)    *Silver Sneakers is covered by some Personal assistant and includes a  Radio producer at participating locations.  Find out more by calling [phone] or visiting [email or web address]  For additional health and human services resources for senior adults, please contact SeniorLine at [phone] in [place] and [place] at [phone] in all other areas.    Fall Prevention in the Home Falls can cause injuries. They can happen to people of all ages. There are many things you can do to make your home safe and to help prevent falls. What can I do on the outside of my home?  Regularly fix the edges of walkways and driveways and fix any cracks.  Remove anything that might make you trip as you walk through a door, such as a raised step or threshold.  Trim any bushes or trees on the path to your home.  Use bright outdoor lighting.  Clear any walking paths of anything that might make someone trip, such as rocks or tools.  Regularly check to see if handrails are loose or broken. Make sure that both sides of any steps have handrails.  Any raised decks and porches should have guardrails on the edges.  Have any leaves, snow, or ice cleared regularly.  Use sand or salt on walking paths during winter.  Clean up any spills in your garage right away. This includes oil or grease spills. What can I do in the bathroom?  Use night  lights.  Install grab bars by the toilet and in the tub and shower. Do not use towel bars as grab bars.  Use non-skid mats or decals in the tub or shower.  If you need to sit down in the shower, use a plastic, non-slip stool.  Keep the floor dry. Clean up any water that spills on the floor as soon as it happens.  Remove soap buildup in the tub or shower regularly.  Attach bath mats securely with double-sided non-slip rug tape.  Do not have throw rugs and other things on the floor that can make you trip. What can I do in the  bedroom?  Use night lights.  Make sure that you have a light by your bed that is easy to reach.  Do not use any sheets or blankets that are too big for your bed. They should not hang down onto the floor.  Have a firm chair that has side arms. You can use this for support while you get dressed.  Do not have throw rugs and other things on the floor that can make you trip. What can I do in the kitchen?  Clean up any spills right away.  Avoid walking on wet floors.  Keep items that you use a lot in easy-to-reach places.  If you need to reach something above you, use a strong step stool that has a grab bar.  Keep electrical cords out of the way.  Do not use floor polish or wax that makes floors slippery. If you must use wax, use non-skid floor wax.  Do not have throw rugs and other things on the floor that can make you trip. What can I do with my stairs?  Do not leave any items on the stairs.  Make sure that there are handrails on both sides of the stairs and use them. Fix handrails that are broken or loose. Make sure that handrails are as long as the stairways.  Check any carpeting to make sure that it is firmly attached to the stairs. Fix any carpet that is loose or worn.  Avoid having throw rugs at the top or bottom of the stairs. If you do have throw rugs, attach them to the floor with carpet tape.  Make sure that you have a light switch at the top of the stairs and the bottom of the stairs. If you do not have them, ask someone to add them for you. What else can I do to help prevent falls?  Wear shoes that: ? Do not have high heels. ? Have rubber bottoms. ? Are comfortable and fit you well. ? Are closed at the toe. Do not wear sandals.  If you use a stepladder: ? Make sure that it is fully opened. Do not climb a closed stepladder. ? Make sure that both sides of the stepladder are locked into place. ? Ask someone to hold it for you, if possible.  Clearly mark and make  sure that you can see: ? Any grab bars or handrails. ? First and last steps. ? Where the edge of each step is.  Use tools that help you move around (mobility aids) if they are needed. These include: ? Canes. ? Walkers. ? Scooters. ? Crutches.  Turn on the lights when you go into a dark area. Replace any light bulbs as soon as they burn out.  Set up your furniture so you have a clear path. Avoid moving your furniture around.  If any of your  floors are uneven, fix them.  If there are any pets around you, be aware of where they are.  Review your medicines with your doctor. Some medicines can make you feel dizzy. This can increase your chance of falling. Ask your doctor what other things that you can do to help prevent falls. This information is not intended to replace advice given to you by your health care provider. Make sure you discuss any questions you have with your health care provider. Document Released: 06/22/2009 Document Revised: 02/01/2016 Document Reviewed: 09/30/2014 Elsevier Interactive Patient Education  2018 [place] Maintenance, Male A healthy lifestyle and preventive care is important for your health and wellness. Ask your health care provider about what schedule of regular examinations is right for you. What should I know about weight and diet? Eat a Healthy Diet  Eat plenty of vegetables, fruits, whole grains, low-fat dairy products, and lean protein.  Do not eat a lot of foods high in solid fats, added sugars, or salt.  Maintain a Healthy Weight Regular exercise can help you achieve or maintain a healthy weight. You should:  Do at least 150 minutes of exercise each week. The exercise should increase your heart rate and make you sweat (moderate-intensity exercise).  Do strength-training exercises at least twice a week.  Watch Your Levels of Cholesterol and Blood Lipids  Have your blood tested for lipids and cholesterol every 5 years starting  at 76 years of age. If you are at high risk for heart disease, you should start having your blood tested when you are 76 years old. You may need to have your cholesterol levels checked more often if: ? Your lipid or cholesterol levels are high. ? You are older than 76 years of age. ? You are at high risk for heart disease.  What should I know about cancer screening? Many types of cancers can be detected early and may often be prevented. Lung Cancer  You should be screened every year for lung cancer if: ? You are a current smoker who has smoked for at least 30 years. ? You are a former smoker who has quit within the past 15 years.  Talk to your health care provider about your screening options, when you should start screening, and how often you should be screened.  Colorectal Cancer  Routine colorectal cancer screening usually begins at 76 years of age and should be repeated every 5-10 years until you are 76 years old. You may need to be screened more often if early forms of precancerous polyps or small growths are found. Your health care provider may recommend screening at an earlier age if you have risk factors for colon cancer.  Your health care provider may recommend using home test kits to check for hidden blood in the stool.  A small camera at the end of a tube can be used to examine your colon (sigmoidoscopy or colonoscopy). This checks for the earliest forms of colorectal cancer.  Prostate and Testicular Cancer  Depending on your age and overall health, your health care provider may do certain tests to screen for prostate and testicular cancer.  Talk to your health care provider about any symptoms or concerns you have about testicular or prostate cancer.  Skin Cancer  Check your skin from head to toe regularly.  Tell your health care provider about any new moles or changes in moles, especially if: ? There is a change in a mole's size, shape, or color. ?  You have a mole that  is larger than a pencil eraser.  Always use sunscreen. Apply sunscreen liberally and repeat throughout the day.  Protect yourself by wearing long sleeves, pants, a wide-brimmed hat, and sunglasses when outside.  What should I know about heart disease, diabetes, and high blood pressure?  If you are 42-66 years of age, have your blood pressure checked every 3-5 years. If you are 31 years of age or older, have your blood pressure checked every year. You should have your blood pressure measured twice-once when you are at a hospital or clinic, and once when you are not at a hospital or clinic. Record the average of the two measurements. To check your blood pressure when you are not at a hospital or clinic, you can use: ? An automated blood pressure machine at a pharmacy. ? A home blood pressure monitor.  Talk to your health care provider about your target blood pressure.  If you are between 75-65 years old, ask your health care provider if you should take aspirin to prevent heart disease.  Have regular diabetes screenings by checking your fasting blood sugar level. ? If you are at a normal weight and have a low risk for diabetes, have this test once every three years after the age of 49. ? If you are overweight and have a high risk for diabetes, consider being tested at a younger age or more often.  A one-time screening for abdominal aortic aneurysm (AAA) by ultrasound is recommended for men aged 5-75 years who are current or former smokers. What should I know about preventing infection? Hepatitis B If you have a higher risk for hepatitis B, you should be screened for this virus. Talk with your health care provider to find out if you are at risk for hepatitis B infection. Hepatitis C Blood testing is recommended for:  Everyone born from [birth year] through 1965.  Anyone with known risk factors for hepatitis C.  Sexually Transmitted Diseases (STDs)  You should be screened each year for STDs  including gonorrhea and chlamydia if: ? You are sexually active and are younger than 76 years of age. ? You are older than 76 years of age and your health care provider tells you that you are at risk for this type of infection. ? Your sexual activity has changed since you were last screened and you are at an increased risk for chlamydia or gonorrhea. Ask your health care provider if you are at risk.  Talk with your health care provider about whether you are at high risk of being infected with HIV. Your health care provider may recommend a prescription medicine to help prevent HIV infection.  What else can I do?  Schedule regular health, dental, and eye exams.  Stay current with your vaccines (immunizations).  Do not use any tobacco products, such as cigarettes, chewing tobacco, and e-cigarettes. If you need help quitting, ask your health care provider.  Limit alcohol intake to no more than 2 drinks per day. One drink equals 12 ounces of beer, 5 ounces of wine, or 1 ounces of hard liquor.  Do not use street drugs.  Do not share needles.  Ask your health care provider for help if you need support or information about quitting drugs.  Tell your health care provider if you often feel depressed.  Tell your health care provider if you have ever been abused or do not feel safe at home. This information is not intended to replace advice given  to you by your health care provider. Make sure you discuss any questions you have with your health care provider. Document Released: 02/22/2008 Document Revised: 04/24/2016 Document Reviewed: 05/30/2015 Elsevier Interactive Patient Education  2018 [employer].   Hearing Loss Hearing loss is a partial or total loss of the ability to hear. This can be temporary or permanent, and it can happen in one or both ears. Hearing loss may be referred to as deafness. Medical care is necessary to treat hearing loss properly and to prevent the condition from  getting worse. Your hearing may partially or completely come back, depending on what caused your hearing loss and how severe it is. In some cases, hearing loss is permanent. What are the causes? Common causes of hearing loss include:  Too much wax in the ear canal.  Infection of the ear canal or middle ear.  Fluid in the middle ear.  Injury to the ear or surrounding area.  An object stuck in the ear.  Prolonged exposure to loud sounds, such as music.  Less common causes of hearing loss include:  Tumors in the ear.  Viral or bacterial infections, such as meningitis.  A hole in the eardrum (perforated eardrum).  Problems with the hearing nerve that sends signals between the brain and the ear.  Certain medicines.  What are the signs or symptoms? Symptoms of this condition may include:  Difficulty telling the difference between sounds.  Difficulty following a conversation when there is background noise.  Lack of response to sounds in your environment. This may be most noticeable when you do not respond to startling sounds.  Needing to turn up the volume on the television, radio, etc.  Ringing in the ears.  Dizziness.  Pain in the ears.  How is this diagnosed? This condition is diagnosed based on a physical exam and a hearing test (audiometry). The audiometry test will be performed by a hearing specialist (audiologist). You may also be referred to an ear, nose, and throat (ENT) specialist (otolaryngologist). How is this treated? Treatment for recent onset of hearing loss may include:  Ear wax removal.  Being prescribed medicines to prevent infection (antibiotics).  Being prescribed medicines to reduce inflammation (corticosteroids).  Follow these instructions at home:  If you were prescribed an antibiotic medicine, take it as told by your health care provider. Do not stop taking the antibiotic even if you start to feel better.  Take over-the-counter and  prescription medicines only as told by your health care provider.  Avoid loud noises.  Return to your normal activities as told by your health care provider. Ask your health care provider what activities are safe for you.  Keep all follow-up visits as told by your health care provider. This is important. Contact a health care provider if:  You feel dizzy.  You develop new symptoms.  You vomit or feel nauseous.  You have a fever. Get help right away if:  You develop sudden changes in your vision.  You have severe ear pain.  You have new or increased weakness.  You have a severe headache. This information is not intended to replace advice given to you by your health care provider. Make sure you discuss any questions you have with your health care provider. Document Released: 08/26/2005 Document Revised: 02/01/2016 Document Reviewed: 01/11/2015 Elsevier Interactive Patient Education  2018 [employer].

## 2017-06-06 NOTE — Telephone Encounter (Signed)
Call to Dr. Delman Cheadle for eye exam report to be faxed. Completed Feb 2018 per the patent. Call to [phone] and the office was closed,  Will try again next week

## 2017-06-06 NOTE — Progress Notes (Signed)
Subjective:   Charles [name] is a 76 y.o. male who presents for Medicare Annual/Subsequent preventive examination.  The Patient was informed that the wellness visit is to identify future health risk and educate and initiate measures that can reduce risk for increased disease through the lifespan.    Annual Wellness Assessment  Reports health as overall; ok Had some issues with hips on the left Cramps at hs  Right leg has a rod   Preventive Screening -Counseling & Management  Medicare Annual Preventive Care Visit - Subsequent Last OV 08/2016 Will see [facility] 08/2017  Dr. Glennon Mac checks every time he goes  Will defer foot exam today    Colonoscopy 07/2012  Tobacco use;  Cigars average 1 to 3 days a cigar   AAA waived- abd CT done 05/2008 Do not see the aorta noted; discussed if he wanted to have an UA AAA;   VS reviewed;   Diet  Breakfast; fruits; BB and strawberries; non fat plain greek yogurt May have ham; 2 pieces- cereal is occasional Lunch; sandwich or salad; go out with a friend; vietnamese rest A lot of sodium Supper  Was on more and less protein based diet with limited carbs and lost weight  Going to dtr in [place]  BMI 34   Exercise Y 5 days a week  Still going to the gym Does 90% walking and riding the bike Some weights  When he walks ; left hip feels better   Hearing Screening Comments: Children tell him he does Hearing screen  Vision Screening Comments: Vision exam  Feb 2018 Dr. Delman Cheadle  call to Dr. Delman Cheadle office and closed Will fup Monday to request eye report    Dr. Tamala Julian looked at left arm in June  Severe OA   Safety Stay off ladders     Cardiac Risk Factors Addressed Hx NSEMI  Hyperlipidemia - chol 118; hdl 42 ldl 52 and trig 118  Was started on statins/ is off of them now when Dr. Sherren Mocha Now Lipitor  Diabetes A1c 6.,9 and bs 120  Checks his BS in the am   Obesity 34   Advanced Directives completed  Patient Care  Team: [name], NP as PCP - General (Family Medicine)    Cardiac Risk Factors include: advanced age (>52mn, >>79women);diabetes mellitus;dyslipidemia;family history of premature cardiovascular disease;hypertension;male gender;obesity (BMI >30kg/m2)     Objective:    Vitals: BP 138/70   Pulse 80   Ht 6' (1.829 m)   Wt 255 lb (115.7 kg)   SpO2 96%   BMI 34.58 kg/m   Body mass index is 34.58 kg/m.  Tobacco History  Smoking Status  . Former Smoker  . Years: 20.00  . Types: Cigarettes, Pipe, Cigars  . Quit date: 09/09/1968  Smokeless Tobacco  . Never Used    Comment: smoking cigars- quit in 13      Counseling given: Not Answered   Past Medical History:  Diagnosis Date  . Anemia    "when I was a lad"  . Arthritis    "right femur" (10/19/2015)  . AVM (arteriovenous malformation) of colon    2 - non-bleeding 2013  . Biceps muscle tear    right  . [place]   [place]rectomy  . Coronary artery disease   . Eczema   . GERD (gastroesophageal reflux disease)   . Hernia, ventral   . Hyperlipidemia   . Hypertension   . Obesity   .  Personal history of adenomatous colonic polyps 07/20/2012   3 + adenomas 2009 07/20/2012 2 diminutive polyps    . Pneumonia 1942; 1950s X 1  . Type II diabetes mellitus ([place])   . Walking pneumonia 2000's X 1   Past Surgical History:  Procedure Laterality Date  . CARDIAC CATHETERIZATION N/A 10/19/2015   Procedure: Left Heart Cath and Coronary Angiography;  Surgeon: Charolette Forward, MD;  Location: [facility];  Service: Cardiovascular;  Laterality: N/A;  . CARDIAC CATHETERIZATION N/A 10/19/2015   Procedure: Coronary Stent Intervention;  Surgeon: Charolette Forward, MD;  Location: [facility];  Service: Cardiovascular;  Laterality: N/A;  . COLONOSCOPY     "nothing showed up this time"  . COLONOSCOPY W/ BIOPSIES AND POLYPECTOMY  X 1  . CORONARY ANGIOPLASTY    . FEMUR IM NAIL Right 07/26/2013   Procedure:  INTRAMEDULLARY (IM) NAIL FEMORAL subtrochanteric;  Surgeon: Mauri Pole, MD;  Location: [facility];  Service: Orthopedics;  Laterality: Right;  . FRACTURE SURGERY    . INGUINAL HERNIA REPAIR Left >3 times  . INGUINAL HERNIA REPAIR Right 2012  . NEPHRECTOMY Left 2000  . TONSILLECTOMY  1940s   Family History  Problem Relation Age of Onset  . Drug abuse Other   . Cancer Other   . Heart disease Other   . Lung disease Other   . Colon cancer Neg Hx   . Stomach cancer Neg Hx   . Diabetes Mother   . Diabetes Sister    History  Sexual Activity  . Sexual activity: No    Outpatient Encounter Prescriptions as of 06/06/2017  Medication Sig  . aspirin 81 MG chewable tablet Chew 1 tablet (81 mg total) by mouth daily.  Marland Kitchen atorvastatin (LIPITOR) 40 MG tablet Take 1 tablet (40 mg total) by mouth daily at 6 PM.  . Bioflavonoid Products (VITAMIN C) CHEW Chew by mouth.  . Cyanocobalamin (VITAMIN B-12 IJ) Inject 1 mL as directed every 30 (thirty) days.  Marland Kitchen EPINEPHrine (EPIPEN 2-PAK) 0.3 mg/0.3 mL DEVI Inject 0.3 mg into the muscle once.    Marland Kitchen glipiZIDE (GLUCOTROL) 5 MG tablet Take 0.5 tablets (2.5 mg total) by mouth 2 (two) times daily before a meal.  . glucose blood (ONETOUCH VERIO) test strip USE ONE STRIP TO CHECK GLUCOSE ONCE DAILY  . lisinopril (PRINIVIL,ZESTRIL) 5 MG tablet Take 1 tablet (5 mg total) by mouth daily.  . metFORMIN (GLUCOPHAGE) 1000 MG tablet Take 1 tablet by mouth two  times daily with meals  . metoprolol succinate (TOPROL-XL) 50 MG 24 hr tablet   . Multiple Vitamins-Minerals (ICAPS PO) Take 1 tablet by mouth daily.  . nitroGLYCERIN (NITROSTAT) 0.4 MG SL tablet Place 1 tablet (0.4 mg total) under the tongue every 5 (five) minutes as needed for chest pain.  Glory Rosebush DELICA LANCETS FINE MISC Use to check sugar daily   No facility-administered encounter medications on file as of 06/06/2017.     Activities of Daily Living In your present state of health, do you have any difficulty  performing the following activities: 06/06/2017  Hearing? N  Vision? N  Difficulty concentrating or making decisions? N  Walking or climbing stairs? N  Comment can't run up and down stairs  Dressing or bathing? N  Doing errands, shopping? N  Preparing Food and eating ? N  Using the Toilet? N  In the past six months, have you accidently leaked urine? Y  Comment frequent urination at hs   Do you  have problems with loss of bowel control? N  Managing your Medications? N  Managing your Finances? N  Housekeeping or managing your Housekeeping? N  Some recent data might be hidden    Patient Care Team: [name], NP as PCP - General (Family Medicine)   Assessment:     Exercise Activities and Dietary recommendations Current Exercise Habits: Structured exercise class, Type of exercise: strength training/weights;walking, Time (Minutes): 60, Frequency (Times/Week): 5, Weekly Exercise (Minutes/Week): 300, Intensity: Moderate  Goals    . Exercise 150 minutes per week (moderate activity)          Start Tai chi     . patient          Will attempt portion control and reduce the calories taken in  Sometimes you can ask for a box to go and separate food out prior to eating       Fall Risk Fall Risk  06/06/2017 11/21/2015 11/21/2015 07/28/2015 07/14/2014  Falls in the past year? No No No No No  Comment trying to reach in closet and stood on magazines - - - -  Injury with Fall? No - - - -  Risk for fall due to : - - Other (Comment) - -  Risk for fall due to: Comment - - Increased caution since broken leg - -   Depression Screen PHQ 2/9 Scores 06/06/2017 11/27/2015 07/28/2015 07/14/2014  PHQ - 2 Score 0 0 0 0    Cognitive Function MMSE - Mini Mental State Exam 06/06/2017  Not completed: (No Data)   Ad8 score reviewed for issues:  Issues making decisions:  Less interest in hobbies / activities:  Repeats questions, stories (family complaining):  Trouble using ordinary gadgets  (microwave, computer, phone):  Forgets the month or year:   Mismanaging finances:   Remembering appts:  Daily problems with thinking and/or memory: Ad8 score is=0       Immunization History  Administered Date(s) Administered  . Influenza Split 07/18/2011, 07/06/2012, 07/07/2012  . Influenza Whole 06/09/2006, 06/17/2008, 07/10/2009, 05/18/2010  . Influenza, High Dose Seasonal PF 06/11/2016, 06/06/2017  . Influenza,inj,Quad PF,6+ Mos 06/14/2013, 06/02/2014, 07/14/2015  . Pneumococcal Conjugate-13 07/14/2014  . Pneumococcal Polysaccharide-23 06/09/2006  . Td 09/09/2001  . Tdap 02/20/2012   Screening Tests Health Maintenance  Topic Date Due  . OPHTHALMOLOGY EXAM  11/06/2017 (Originally 10/29/2016)  . FOOT EXAM  06/06/2018 (Originally 09/28/2015)  . COLONOSCOPY  07/20/2017  . HEMOGLOBIN A1C  08/19/2017  . TETANUS/TDAP  02/19/2022  . INFLUENZA VACCINE  Completed  . PNA vac Low Risk Adult  Completed      Plan:     PCP Notes  Health Maintenance AAA waived at this time due to multiple abd CTs Educated regarding Shingrix  Flu vaccine given today   Eye exam completed in Feb by Dr. Delman Cheadle Call to the office and they are closed. Will note to try again on Monday to get eye report   Abnormal Screens  BMI 35   Referrals  May go to [facility] for hearing loss   Patient concerns; States he has frequency of urination at hs   Also having more pain in right hip  To fup with [facility] in December   Nurse Concerns; As noted   Next PCP apt 1205/2018     I have personally reviewed and noted the following in the patient's chart:   . Medical and social history . Use of alcohol, tobacco or illicit drugs  . Current medications  and supplements . Functional ability and status . Nutritional status . Physical activity . Advanced directives . List of other physicians . Hospitalizations, surgeries, and ER visits in previous 12 months . Vitals . Screenings to include  cognitive, depression, and falls . Referrals and appointments  In addition, I have reviewed and discussed with patient certain preventive protocols, quality metrics, and best practice recommendations. A written personalized care plan for preventive services as well as general preventive health recommendations were provided to patient.     Wynetta Fines, RN  06/06/2017

## 2017-06-10 ENCOUNTER — Encounter: Payer: Self-pay | Admitting: Internal Medicine

## 2017-06-10 ENCOUNTER — Ambulatory Visit (INDEPENDENT_AMBULATORY_CARE_PROVIDER_SITE_OTHER): Payer: Medicare Other | Admitting: Internal Medicine

## 2017-06-10 VITALS — BP 134/82 | HR 80 | Wt 251.0 lb

## 2017-06-10 DIAGNOSIS — E1165 Type 2 diabetes mellitus with hyperglycemia: Secondary | ICD-10-CM | POA: Diagnosis not present

## 2017-06-10 DIAGNOSIS — E538 Deficiency of other specified B group vitamins: Secondary | ICD-10-CM | POA: Diagnosis not present

## 2017-06-10 DIAGNOSIS — E1159 Type 2 diabetes mellitus with other circulatory complications: Secondary | ICD-10-CM | POA: Diagnosis not present

## 2017-06-10 LAB — POCT GLYCOSYLATED HEMOGLOBIN (HGB A1C): HEMOGLOBIN A1C: 7.2

## 2017-06-10 NOTE — Progress Notes (Signed)
Patient ID: Charles [name], male   DOB: 16-Oct-1940, 76 y.o.   MRN: [id number]  HPI: Charles [name] is a 76 y.o.-year-old male, returning for f/u for DM2 dx 2008, non-insulin-dependent, uncontrolled, with complications (CAD - s/p NSTEMI, PN, mild CKD). Last visit 4 mo ago.   DM2: Last hemoglobin A1c was: Lab Results  Component Value Date   HGBA1C 6.9 02/17/2017   HGBA1C 6.6 10/22/2016   HGBA1C 6.9 (H) 08/08/2016  12/2013: 8.6%  Pt is on a regimen of: - Metformin 1000 mg po bid - Glipizide 2.5 mg 2x a day before meals   Pt checks his sugars 1x a day (no log, no meter): - am: 125-135, 210 if forgets meds >> 110-150 >> 112, 120s-160s (130-140 usually) - 2h after b'fast: 106-187 >> 78-116 >> n/c >> 68x1 >> n/c - before lunch: 79-90, 157 >> n/c - 2h after lunch: 123-176 >> n/c - before dinner: 106, 125 >> n/c >> 57, 106 >> n/c >> 160s - 2h after dinner: 173-197 >> n/c - bedtime: 103, 115, 138 >> 125-135 >> 110-150 >> 160s Lowest sugar was  57 >> 81 >> 98 >> 100; he has hypoglycemia awareness in the 80. Highest sugar was 204 >> 180.  - + mild CKD, last BUN/creatinine:  Lab Results  Component Value Date   BUN 19 02/18/2017   CREATININE 1.21 (H) 02/18/2017  Of note, he had a nephrectomies.  + Lisinopril.  ACR in 03/2013: 5.3.  - He has HTG; last set of lipids: Lab Results  Component Value Date   CHOL 118 02/18/2017   HDL 42.00 02/18/2017   LDLCALC 52 02/18/2017   LDLDIRECT 139.8 07/08/2014   TRIG 118.0 02/18/2017   CHOLHDL 3 02/18/2017   - last eye exam was in 10/2016 >> No DR - Dr Delman Cheadle. - he has numbness and tingling in his feet >> improved.  We diagnosed B12 deficiency: Component     Latest Ref Rng 10/13/2015  Vitamin B12     211 - 911 pg/mL 94 (L)   He was on im inj >> now on 5000 mcg daily B12 po. Last level not low: Lab Results  Component Value Date   VITAMINB12 1,249 (H) 02/18/2017   VITAMINB12 234 10/22/2016   VITAMINB12 217 04/19/2016   VITAMINB12 94 (L)  10/13/2015   He was admitted with NSTEMI 10/18/2015. He had a stent.   He goes to the gym 5/7 days.  ROS: Constitutional: no weight gain/no weight loss, no fatigue, no subjective hyperthermia, no subjective hypothermia, + nocturia Eyes: no blurry vision, no xerophthalmia ENT: no sore throat, no nodules palpated in throat, no dysphagia, no odynophagia, no hoarseness Cardiovascular: no CP/no SOB/no palpitations/no leg swelling Respiratory: no cough/no SOB/no wheezing Gastrointestinal: no N/no V/no D/no C/no acid reflux Musculoskeletal: + muscle aches/+ joint aches Skin: no rashes, no hair loss Neurological: no tremors/+ numbness/+ tingling/no dizziness  I reviewed pt's medications, allergies, PMH, social hx, family hx, and changes were documented in the history of present illness. Otherwise, unchanged from my initial visit note.  PE: BP 134/82 (BP Location: Left Arm, Patient Position: Sitting)   Pulse 80   Wt 251 lb (113.9 kg)   SpO2 97%   BMI 34.04 kg/m  Wt Readings from Last 3 Encounters:  06/10/17 251 lb (113.9 kg)  06/06/17 255 lb (115.7 kg)  02/17/17 250 lb (113.4 kg)   Constitutional: overweight, in NAD Eyes: PERRLA, EOMI, no exophthalmos ENT: moist mucous membranes, no thyromegaly, no cervical  lymphadenopathy Cardiovascular: RRR, No MRG, + periankle edema Respiratory: CTA B Gastrointestinal: abdomen soft, NT, ND, BS+ Musculoskeletal: no deformities, strength intact in all 4 Skin: moist, warm, no rashes Neurological: no tremor with outstretched hands, DTR normal in all 4  ASSESSMENT: 1. DM2, non-insulin-dependent, now controlled, with complications - CAD - s/p NSTEMI (Dr. Terrence Dupont) - peripheral neuropathy - CKD  2. B12 def  PLAN:  1. Patient with long-standing, fairly well controlled DM, now worse after dietary indiscretions. Unfortunately, he could not obtain an SGLT2 inh as suggested at last visit as it was expensive. I will advise him to also check DPP4  inh's. Until then, will increase his Glipizide, but also discussed about improving diet >> lower fat and concentrated sweets. - advised to: Patient Instructions  Please continue: - Metformin 1000 mg 2x a day  Please increase - Glipizide to 5 mg 2x a day before meals, except leave the am dose at 2.5 mg in the days you go to the gym.  Please check with your insurance if they cover: - Januvia - Tradjenta - Alogliptin - [place]  Please return in 3 months with your sugar log.  - today, HbA1c is 7.2% (higher) - continue checking sugars at different times of the day - check 1x a day, rotating checks - advised for yearly eye exams >> he is UTD - UTD with flu shot - Return to clinic in 3 mo with sugar log   2. B12 def. - since he was on Metformin, we checked a B12 vitamin in 10/2015 >> very low - he was on B12 IM monthly injections, and then we switched to po 5000 g B12 daily - we check B12 level at last visit and this was not low  Charles Kingdom, MD PhD [facility] Endocrinology

## 2017-06-10 NOTE — Patient Instructions (Addendum)
Please continue: - Metformin 1000 mg 2x a day  Please increase - Glipizide to 5 mg 2x a day before meals, except leave the am dose at 2.5 mg in the days you go to the gym.  Please check with your insurance if they cover: - Januvia - Tradjenta - Alogliptin - [place]  Please return in 3 months with your sugar log.

## 2017-06-11 NOTE — Telephone Encounter (Signed)
Call to Dr. Delman Cheadle office and they will fax over last eye exam; Att; Manuela Schwartz

## 2017-06-13 NOTE — Telephone Encounter (Signed)
Vision report rec'd from Dr. Delman Cheadle and QM updated.

## 2017-07-01 ENCOUNTER — Encounter: Payer: Self-pay | Admitting: Adult Health

## 2017-07-01 ENCOUNTER — Ambulatory Visit (INDEPENDENT_AMBULATORY_CARE_PROVIDER_SITE_OTHER): Payer: Medicare Other | Admitting: Adult Health

## 2017-07-01 ENCOUNTER — Ambulatory Visit (INDEPENDENT_AMBULATORY_CARE_PROVIDER_SITE_OTHER)
Admission: RE | Admit: 2017-07-01 | Discharge: 2017-07-01 | Disposition: A | Discharge status: Home / Self Care | Payer: Medicare Other | Source: Ambulatory Visit | Attending: Adult Health | Admitting: Adult Health

## 2017-07-01 VITALS — BP 124/78 | Temp 98.3°F | Ht 72.0 in | Wt 256.0 lb

## 2017-07-01 DIAGNOSIS — M25552 Pain in left hip: Secondary | ICD-10-CM

## 2017-07-01 MED ORDER — CYCLOBENZAPRINE HCL 10 MG PO TABS: 10.0000 mg | ORAL_TABLET | Freq: Three times a day (TID) | ORAL | 0 refills | Status: DC | PRN

## 2017-07-01 NOTE — Progress Notes (Signed)
Subjective:    Patient ID: Charles [name], male    DOB: 12/15/1940, 76 y.o.   MRN: [id number]  HPI  76 year old male who  has a past medical history of Anemia; Arthritis; AVM (arteriovenous malformation) of colon; Biceps muscle tear; Cancer of kidney ([place]) (2000); Coronary artery disease; Eczema; GERD (gastroesophageal reflux disease); Hernia, ventral; Hyperlipidemia; Hypertension; Obesity; Personal history of adenomatous colonic polyps (07/20/2012); Pneumonia (1942; 1950s X 1); Type II diabetes mellitus ([place]); and Walking pneumonia (2000's X 1).  He presents to the office today for left hip pain. He reports that his hip pain has been constant for 3 months but has gradually been becoming worse. He describes it as a "catching sensation." Is worse when changing positions. He denies any trauma. He will often get pain in the left lower back as well when he changes positions.   He has been using Aleve which helps temporarily with the pain.   No pain with walking   Review of Systems See HPI   Past Medical History:  Diagnosis Date  . Anemia    "when I was a lad"  . Arthritis    "right femur" (10/19/2015)  . AVM (arteriovenous malformation) of colon    2 - non-bleeding 2013  . Biceps muscle tear    right  . Cancer of kidney ([place]) 2000   left nephrectomy  . Coronary artery disease   . Eczema   . GERD (gastroesophageal reflux disease)   . Hernia, ventral   . Hyperlipidemia   . Hypertension   . Obesity   . Personal history of adenomatous colonic polyps 07/20/2012   3 + adenomas 2009 07/20/2012 2 diminutive polyps    . Pneumonia 1942; 1950s X 1  . Type II diabetes mellitus ([place])   . Walking pneumonia 2000's X 1    Social History   Social History  . Marital status: Widowed    Spouse name: N/A  . Number of children: N/A  . Years of education: N/A   Occupational History  . Not on file.   Social History Main Topics  . Smoking status: Former Smoker    Years: 20.00    Types:  Cigarettes, Pipe, Cigars    Quit date: 09/09/1968  . Smokeless tobacco: Never Used     Comment: smoking cigars- quit in 13   . Alcohol use 0.6 oz/week    1 Cans of beer per week     Comment: occasionally   . Drug use: No  . Sexual activity: No   Other Topics Concern  . 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    Not married- widowed    Three children, One in [place], one in [place] one in [place]    Past Surgical History:  Procedure Laterality Date  . CARDIAC CATHETERIZATION N/A 10/19/2015   Procedure: Left Heart Cath and Coronary Angiography;  Surgeon: Charolette Forward, MD;  Location: [facility];  Service: Cardiovascular;  Laterality: N/A;  . CARDIAC CATHETERIZATION N/A 10/19/2015   Procedure: Coronary Stent Intervention;  Surgeon: Charolette Forward, MD;  Location: [facility];  Service: Cardiovascular;  Laterality: N/A;  . COLONOSCOPY     "nothing showed up this time"  . COLONOSCOPY W/ BIOPSIES AND POLYPECTOMY  X 1  . CORONARY ANGIOPLASTY    . FEMUR IM NAIL Right 07/26/2013  Procedure: INTRAMEDULLARY (IM) NAIL FEMORAL subtrochanteric;  Surgeon: Mauri Pole, MD;  Location: [facility];  Service: Orthopedics;  Laterality: Right;  . FRACTURE SURGERY    . INGUINAL HERNIA REPAIR Left >3 times  . INGUINAL HERNIA REPAIR Right 2012  . NEPHRECTOMY Left 2000  . TONSILLECTOMY  1940s    Family History  Problem Relation Age of Onset  . Drug abuse Other   . Cancer Other   . Heart disease Other   . Lung disease Other   . Colon cancer Neg Hx   . Stomach cancer Neg Hx   . Diabetes Mother   . Diabetes Sister     No Known Allergies  Current Outpatient Prescriptions on File Prior to Visit  Medication Sig Dispense Refill  . aspirin 81 MG chewable tablet Chew 1 tablet (81 mg total) by mouth daily.    Marland Kitchen atorvastatin (LIPITOR) 40 MG tablet Take 1 tablet (40 mg  total) by mouth daily at 6 PM. 30 tablet 3  . Bioflavonoid Products (VITAMIN C) CHEW Chew by mouth.    . cyanocobalamin 1000 MCG tablet Take 5,000 mcg by mouth daily.    Marland Kitchen EPINEPHrine (EPIPEN 2-PAK) 0.3 mg/0.3 mL DEVI Inject 0.3 mg into the muscle once.      Marland Kitchen glipiZIDE (GLUCOTROL) 5 MG tablet Take 0.5 tablets (2.5 mg total) by mouth 2 (two) times daily before a meal. 90 tablet 2  . glucose blood (ONETOUCH VERIO) test strip USE ONE STRIP TO CHECK GLUCOSE ONCE DAILY 100 each 5  . lisinopril (PRINIVIL,ZESTRIL) 5 MG tablet Take 1 tablet (5 mg total) by mouth daily. 30 tablet 3  . metFORMIN (GLUCOPHAGE) 1000 MG tablet Take 1 tablet by mouth two  times daily with meals 360 tablet 3  . metoprolol succinate (TOPROL-XL) 50 MG 24 hr tablet     . Multiple Vitamins-Minerals (ICAPS PO) Take 1 tablet by mouth daily.    . nitroGLYCERIN (NITROSTAT) 0.4 MG SL tablet Place 1 tablet (0.4 mg total) under the tongue every 5 (five) minutes as needed for chest pain. 25 tablet 1  . ONETOUCH DELICA LANCETS FINE MISC Use to check sugar daily 100 each 5   No current facility-administered medications on file prior to visit.     BP 124/78 (BP Location: Right Arm)   Temp 98.3 F (36.8 C) (Oral)   Ht 6' (1.829 m)   Wt 256 lb (116.1 kg)   BMI 34.72 kg/m       Objective:   Physical Exam  Constitutional: He is oriented to person, place, and time. He appears well-developed and well-nourished. No distress.  Cardiovascular: Normal rate, regular rhythm, normal heart sounds and intact distal pulses.  Exam reveals no gallop and no friction rub.   No murmur heard. Pulmonary/Chest: Effort normal and breath sounds normal. No respiratory distress. He has no wheezes. He has no rales. He exhibits no tenderness.  Musculoskeletal: Normal range of motion. He exhibits no edema, tenderness or deformity.  No pain with straight leg raise, knee to chest, or internal/external rotation.  He did have pain to left hip with laying down on  exam table.   Neurological: He is alert and oriented to person, place, and time.  Skin: Skin is warm and dry. No rash noted. He is not diaphoretic. No erythema. No pallor.  Psychiatric: He has a normal mood and affect. His behavior is normal. Judgment and thought content normal.  Vitals reviewed.     Assessment & Plan:  1. Left  hip pain Consider arthritis, labral tear or muscular in nature ( due to low back pain) . Will prescribe Flexeril to see if this helps.  - He is already established with Dr. Charlann Boxer, I wold advise follow up with him as well  - cyclobenzaprine (FLEXERIL) 10 MG tablet; Take 1 tablet (10 mg total) by mouth 3 (three) times daily as needed for muscle spasms.  Dispense: 30 tablet; Refill: 0 - DG HIP UNILAT WITH PELVIS 2-3 VIEWS LEFT; Future   [name], NP

## 2017-07-04 ENCOUNTER — Ambulatory Visit (INDEPENDENT_AMBULATORY_CARE_PROVIDER_SITE_OTHER): Payer: Medicare Other | Admitting: Family Medicine

## 2017-07-04 ENCOUNTER — Ambulatory Visit: Payer: Self-pay

## 2017-07-04 ENCOUNTER — Encounter: Payer: Self-pay | Admitting: Family Medicine

## 2017-07-04 VITALS — BP 144/90 | HR 88 | Ht 72.0 in | Wt 256.0 lb

## 2017-07-04 DIAGNOSIS — M7062 Trochanteric bursitis, left hip: Secondary | ICD-10-CM | POA: Diagnosis not present

## 2017-07-04 DIAGNOSIS — M25552 Pain in left hip: Secondary | ICD-10-CM

## 2017-07-04 NOTE — Progress Notes (Signed)
[name] [address], [place] [address] Phone: [phone] Subjective:    CC: left hip pain   [id number]  LESHON ARMISTEAD is a 76 y.o. male coming in with complaint of left hip pain. Several weeks ago he noticed pain in his hip. He believes he has more of a glut problem. Movement makes his pain worse. Worse in the morning. Aleve makes his hip feel better. When he lays on the right hip his left hip aches. Sharp throbbing pain. Patient rates the severity pain as 5 out of 10. Not affecting daily activities at this time but can be very uncomfortable at night. Symptoms that he sits too long also has significant discomfort and pain. States that once he gets walking and seems to improve slowly.      Past Medical History:  Diagnosis Date  . Anemia    "when I was a lad"  . Arthritis    "right femur" (10/19/2015)  . AVM (arteriovenous malformation) of colon    2 - non-bleeding 2013  . Biceps muscle tear    right  . Cancer of kidney ([place] [facility]) 2000   left nephrectomy  . Coronary artery disease   . Eczema   . GERD (gastroesophageal reflux disease)   . Hernia, ventral   . Hyperlipidemia   . Hypertension   . Obesity   . Personal history of adenomatous colonic polyps 07/20/2012   3 + adenomas 2009 07/20/2012 2 diminutive polyps    . Pneumonia 1942; 1950s X 1  . Type II diabetes mellitus ([facility] [facility])   . Walking pneumonia 2000's X 1   Past Surgical History:  Procedure Laterality Date  . CARDIAC CATHETERIZATION N/A 10/19/2015   Procedure: Left Heart Cath and Coronary Angiography;  Surgeon: Charolette Forward, MD;  Location: [facility];  Service: Cardiovascular;  Laterality: N/A;  . CARDIAC CATHETERIZATION N/A 10/19/2015   Procedure: Coronary Stent Intervention;  Surgeon: Charolette Forward, MD;  Location: [facility];  Service: Cardiovascular;  Laterality: N/A;  . COLONOSCOPY     "nothing showed up this time"  . COLONOSCOPY W/ BIOPSIES AND POLYPECTOMY  X 1   . CORONARY ANGIOPLASTY    . FEMUR IM NAIL Right 07/26/2013   Procedure: INTRAMEDULLARY (IM) NAIL FEMORAL subtrochanteric;  Surgeon: Mauri Pole, MD;  Location: [facility];  Service: Orthopedics;  Laterality: Right;  . FRACTURE SURGERY    . INGUINAL HERNIA REPAIR Left >3 times  . INGUINAL HERNIA REPAIR Right 2012  . NEPHRECTOMY Left 2000  . TONSILLECTOMY  1940s   Social History   Social History  . Marital status: Widowed    Spouse name: N/A  . Number of children: N/A  . Years of education: N/A   Social History Main Topics  . Smoking status: Former Smoker    Years: 20.00    Types: Cigarettes, Pipe, Cigars    Quit date: 09/09/1968  . Smokeless tobacco: Never Used     Comment: smoking cigars- quit in 13   . Alcohol use 0.6 oz/week    1 Cans of beer per week     Comment: occasionally   . Drug use: No  . Sexual activity: No   Other Topics Concern  . 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    Not married- widowed    Three children, One  in [place], one in [place] one in [place]   No Known Allergies Family History  Problem Relation Age of Onset  . Drug abuse Other   . Cancer Other   . Heart disease Other   . Lung disease Other   . Colon cancer Neg Hx   . Stomach cancer Neg Hx   . Diabetes Mother   . Diabetes Sister      Past medical history, social, surgical and family history all reviewed in electronic medical record.  No pertanent information unless stated regarding to the chief complaint.   Review of Systems:Review of systems updated and as accurate as of 07/04/17  No headache, visual changes, nausea, vomiting, diarrhea, constipation, dizziness, abdominal pain, skin rash, fevers, chills, night sweats, weight loss, swollen lymph nodes, body aches, joint swelling, muscle aches, chest pain, shortness of breath, mood changes.   Objective  Blood pressure  (!) 144/90, pulse 88, height 6' (1.829 m), weight 256 lb (116.1 kg), SpO2 95 %. Systems examined below as of 07/04/17   General: No apparent distress alert and oriented x3 mood and affect normal, dressed appropriately.  HEENT: Pupils equal, extraocular movements intact  Respiratory: Patient's speak in full sentences and does not appear short of breath  Cardiovascular: No lower extremity edema, non tender, no erythema  Skin: Warm dry intact with no signs of infection or rash on extremities or on axial skeleton.  Abdomen: Soft nontender  Neuro: Cranial nerves II through XII are intact, neurovascularly intact in all extremities with 2+ DTRs and 2+ pulses.  Lymph: No lymphadenopathy of posterior or anterior cervical chain or axillae bilaterally.  Gait normal with good balance and coordination.  MSK:  Non tender with full range of motion and good stability and symmetric strength and tone of shoulders, elbows, wrist, knee and ankles bilaterally.  Hip: Left ROM IR: 25 Deg, ER: 35 Deg, Flexion: 120 Deg, Extension: 100 Deg, Abduction: 45 Deg, Adduction: 25 Deg Strength IR: 5/5, ER: 5/5, Flexion: 5/5, Extension: 5/5, Abduction: 4/5, Adduction: 5/5 Pelvic alignment unremarkable to inspection and palpation. Standing hip rotation and gait without trendelenburg sign / unsteadiness. Greater trochanter without tenderness to palpation. No tenderness over piriformis and greater trochanter. Positive pain with Corky Sox but no pain with internal rotation No SI joint tenderness and normal minimal SI movement. Contralateral hip unremarkable  Procedure 97110; 15 additional minutes spent for Therapeutic exercises as stated in above notes.  This included exercises focusing on stretching, strengthening, with significant focus on eccentric aspects.   Long term goals include an improvement in range of motion, strength, endurance as well as avoiding reinjury. Patient's frequency would include in 1-2 times a day, 3-5 times  a week for a duration of 6-12 weeks. Hip strengthening exercises which included:  Pelvic tilt/bracing to help with proper recruitment of the lower abs and pelvic floor muscles  Glute strengthening to properly contract glutes without over-engaging low back and hamstrings - prone hip extension and glute bridge exercises Proper stretching techniques to increase effectiveness for the hip flexors, groin, quads, piriformic and low back when appropriate    Proper technique shown and discussed handout in great detail with ATC.  All questions were discussed and answered.     Impression and Recommendations:     This case required medical decision making of moderate complexity.      Note: This dictation was prepared with Dragon dictation along with smaller phrase technology. Any transcriptional errors that result from this process are unintentional.

## 2017-07-04 NOTE — Assessment & Plan Note (Signed)
Patient is a what appears to be more of a greater trochanter bursitis. Given home exercises, icing regimen, topical anti-inflammatories. We discussed the importance of hip abductor function. Patient come back to see me again in 4 weeks. If no improvement we'll consider injection.

## 2017-07-04 NOTE — Patient Instructions (Signed)
Good to see you  Ice 20 minutes 2 times daily. Usually after activity and before bed. pennsaid pinkie amount topically 2 times daily as needed.   Exercises on wall.  Heel and butt touching.  Raise leg 6 inches and hold 2 seconds.  Down slow for count of 4 seconds.  1 set of 30 reps daily on both sides.  Keep being active See me again in 4 weeks. If not better we will do injection.

## 2017-07-08 NOTE — Progress Notes (Signed)
[name] [address], [place] [address] Phone: [phone] Subjective:    I'm seeing this patient by the request  of:  [name], NP   CC: Hip pain follow-up  [id number]  Charles [name] is a 76 y.o. male coming in with complaint of hip pain for the past 2 weeks. Was seen last week and diagnosed with a left greater trochanteric bursitis. Patient elected try conservative therapy and avoid any type of injection. Patient states that he his in constant pain. The intensity varies. He does have shoot pain down his leg at times. He has tried taking a muscle relaxer but that didn't help his pain.       Past Medical History:  Diagnosis Date  . Anemia    "when I was a lad"  . Arthritis    "right femur" (10/19/2015)  . AVM (arteriovenous malformation) of colon    2 - non-bleeding 2013  . Biceps muscle tear    right  . Cancer of kidney ([place]) 2000   left nephrectomy  . Coronary artery disease   . Eczema   . GERD (gastroesophageal reflux disease)   . Hernia, ventral   . Hyperlipidemia   . Hypertension   . Obesity   . Personal history of adenomatous colonic polyps 07/20/2012   3 + adenomas 2009 07/20/2012 2 diminutive polyps    . Pneumonia 1942; 1950s X 1  . Type II diabetes mellitus ([facility])   . Walking pneumonia 2000's X 1   Past Surgical History:  Procedure Laterality Date  . CARDIAC CATHETERIZATION N/A 10/19/2015   Procedure: Left Heart Cath and Coronary Angiography;  Surgeon: Charolette Forward, MD;  Location: [facility];  Service: Cardiovascular;  Laterality: N/A;  . CARDIAC CATHETERIZATION N/A 10/19/2015   Procedure: Coronary Stent Intervention;  Surgeon: Charolette Forward, MD;  Location: [facility];  Service: Cardiovascular;  Laterality: N/A;  . COLONOSCOPY     "nothing showed up this time"  . COLONOSCOPY W/ BIOPSIES AND POLYPECTOMY  X 1  . CORONARY ANGIOPLASTY    . FEMUR IM NAIL Right 07/26/2013   Procedure: INTRAMEDULLARY  (IM) NAIL FEMORAL subtrochanteric;  Surgeon: Mauri Pole, MD;  Location: [facility];  Service: Orthopedics;  Laterality: Right;  . FRACTURE SURGERY    . INGUINAL HERNIA REPAIR Left >3 times  . INGUINAL HERNIA REPAIR Right 2012  . NEPHRECTOMY Left 2000  . TONSILLECTOMY  1940s   Social History   Social History  . Marital status: Widowed    Spouse name: N/A  . Number of children: N/A  . Years of education: N/A   Social History Main Topics  . Smoking status: Former Smoker    Years: 20.00    Types: Cigarettes, Pipe, Cigars    Quit date: 09/09/1968  . Smokeless tobacco: Never Used     Comment: smoking cigars- quit in 13   . Alcohol use 0.6 oz/week    1 Cans of beer per week     Comment: occasionally   . Drug use: No  . Sexual activity: No   Other Topics Concern  . Not on file   Social History Narrative   Regular exercise: goes to the [facility] [employer] [facility]   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    Not married- widowed    Three children, One in [place], one in [place] one in [place]   No Known Allergies Family History  Problem Relation Age of Onset  . Drug abuse Other   . Cancer Other   . Heart disease Other   . Lung disease Other   . Colon cancer Neg Hx   . Stomach cancer Neg Hx   . Diabetes Mother   . Diabetes Sister      Past medical history, social, surgical and family history all reviewed in electronic medical record.  No pertanent information unless stated regarding to the chief complaint.   Review of Systems:Review of systems updated and as accurate as of 07/08/17  No headache, visual changes, nausea, vomiting, diarrhea, constipation, dizziness, abdominal pain, skin rash, fevers, chills, night sweats, weight loss, swollen lymph nodes, body aches, joint swelling, chest pain, shortness of breath, mood changes. Positive muscle aches  Objective  There were no vitals taken for this visit. Systems examined below as of 07/08/17   General:  No apparent distress alert and oriented x3 mood and affect normal, dressed appropriately.  HEENT: Pupils equal, extraocular movements intact  Respiratory: Patient's speak in full sentences and does not appear short of breath  Cardiovascular: No lower extremity edema, non tender, no erythema  Skin: Warm dry intact with no signs of infection or rash on extremities or on axial skeleton.  Abdomen: Soft nontender  Neuro: Cranial nerves II through XII are intact, neurovascularly intact in all extremities with 2+ DTRs and 2+ pulses.  Lymph: No lymphadenopathy of posterior or anterior cervical chain or axillae bilaterally.  Gait normal with good balance and coordination.  MSK:  Non tender with full range of motion and good stability and symmetric strength and tone of shoulders, elbows, wrist,  knee and ankles bilaterally.  Hip: Left ROM IR: 45 Deg, ER: 25 Deg, Flexion: 120 Deg, Extension: 100 Deg, Abduction: 45 Deg, Adduction: 45 Deg Strength IR: 5/5, ER: 5/5, Flexion: 5/5, Extension: 5/5, Abduction: 5/5, Adduction: 5/5 Pelvic alignment unremarkable to inspection and palpation. Standing hip rotation and gait without trendelenburg sign / unsteadiness. Greater trochanteric bursitis noted on the left side. Severely tender over the lateral aspect of the hip. No pain with internal rotation. No SI joint tenderness and normal minimal SI movement.    Procedure: Real-time Ultrasound Guided Injection of left  greater trochanteric bursitis secondary to patient's body habitus Device: GE Logiq Q7  Ultrasound guided injection is preferred based studies that show increased duration, increased effect, greater accuracy, decreased procedural pain, increased response rate, and decreased cost with ultrasound guided versus blind injection.  Verbal informed consent obtained.  Time-out conducted.  Noted no overlying erythema, induration, or other signs of local infection.  Skin prepped in a sterile fashion.  Local  anesthesia: Topical Ethyl chloride.  With sterile technique and under real time ultrasound guidance:  Greater trochanteric area was visualized and patient's bursa was noted. A 22-gauge 3 inch needle was inserted and 4 cc of 0.5% Marcaine and 1 cc of Kenalog 40 mg/dL was injected. Pictures taken Completed without difficulty  Pain immediately resolved suggesting accurate placement of the medication.  Advised to call if fevers/chills, erythema, induration, drainage, or persistent bleeding.  Images permanently stored and available for review in the ultrasound unit.  Impression: Technically successful ultrasound guided injection.   Impression and Recommendations:     This case required medical decision making of moderate complexity.      Note: This dictation was prepared with Dragon dictation along with smaller phrase technology. Any transcriptional errors that result from this process are unintentional.

## 2017-07-09 ENCOUNTER — Encounter: Payer: Self-pay | Admitting: Family Medicine

## 2017-07-09 ENCOUNTER — Ambulatory Visit: Payer: Self-pay

## 2017-07-09 ENCOUNTER — Ambulatory Visit (INDEPENDENT_AMBULATORY_CARE_PROVIDER_SITE_OTHER): Payer: Medicare Other | Admitting: Family Medicine

## 2017-07-09 VITALS — BP 138/84 | HR 91 | Ht 72.0 in | Wt 256.0 lb

## 2017-07-09 DIAGNOSIS — M25552 Pain in left hip: Secondary | ICD-10-CM

## 2017-07-09 DIAGNOSIS — M7062 Trochanteric bursitis, left hip: Secondary | ICD-10-CM

## 2017-07-09 NOTE — Patient Instructions (Signed)
Good to see you  Alvera Singh is your friend. Ice 20 minutes 2 times daily. Usually after activity and before bed. Exercises 3 times a week.  Try to avoid stretching too much  Exercises on wall.  Heel and butt touching.  Raise leg 6 inches and hold 2 seconds.  Down slow for count of 4 seconds.  1 set of 30 reps daily on both sides.  Stay active See me again in 4-5 weeks

## 2017-07-09 NOTE — Assessment & Plan Note (Signed)
Patient was having worsening symptoms. Given injection today. Tolerated the procedure well with good resolution of pain. We discussed icing regimen. We also discussed differential includes lumbar radiculopathy. Possible imaging will be needed with history of renal cell carcinoma. Patient is in agreement with plan. Continue conservative therapy including home exercises and follow-up in 4 weeks

## 2017-07-14 ENCOUNTER — Ambulatory Visit: Payer: Medicare Other | Admitting: Family Medicine

## 2017-08-05 NOTE — Progress Notes (Signed)
[name] [address], [place] [address] Phone: [phone] Subjective:    I'm seeing this patient by the request  of:    CC: Left hip pain follow-up  [id number]  Charles [name] is a 76 y.o. male coming in for follow up for left hip pain. He feels about the same as last visit. He does not feel that the injection helped. Patient only received 3 hours of relief from the injection.  Patient states if anything it seems to be worsening again.  Patient states that he seems to work out and is okay but if he works or walks greater than 200 feet he has worsening symptoms.  Patient states he has to sit down and then seems to get better.  Patient states first steps can be difficult.  Patient visited family secondary to the holiday last week and missed out on multiple different events secondary to the discomfort and pain.  Patient did have x-rays of the hip last exam that did not show any significant underlying patient though did have significant lumbar degenerative changes.  This was independently visualized by me.    Past Medical History:  Diagnosis Date  . Anemia    "when I was a lad"  . Arthritis    "right femur" (10/19/2015)  . AVM (arteriovenous malformation) of colon    2 - non-bleeding 2013  . Biceps muscle tear    right  . Cancer of kidney ([facility]) 2000   left nephrectomy  . Coronary artery disease   . Eczema   . GERD (gastroesophageal reflux disease)   . Hernia, ventral   . Hyperlipidemia   . Hypertension   . Obesity   . Personal history of adenomatous colonic polyps 07/20/2012   3 + adenomas 2009 07/20/2012 2 diminutive polyps    . Pneumonia 1942; 1950s X 1  . Type II diabetes mellitus ([place])   . Walking pneumonia 2000's X 1   Past Surgical History:  Procedure Laterality Date  . CARDIAC CATHETERIZATION N/A 10/19/2015   Procedure: Left Heart Cath and Coronary Angiography;  Surgeon: Charolette Forward, MD;  Location: [facility];   Service: Cardiovascular;  Laterality: N/A;  . CARDIAC CATHETERIZATION N/A 10/19/2015   Procedure: Coronary Stent Intervention;  Surgeon: Charolette Forward, MD;  Location: [facility];  Service: Cardiovascular;  Laterality: N/A;  . COLONOSCOPY     "nothing showed up this time"  . COLONOSCOPY W/ BIOPSIES AND POLYPECTOMY  X 1  . CORONARY ANGIOPLASTY    . FEMUR IM NAIL Right 07/26/2013   Procedure: INTRAMEDULLARY (IM) NAIL FEMORAL subtrochanteric;  Surgeon: Mauri Pole, MD;  Location: [facility];  Service: Orthopedics;  Laterality: Right;  . FRACTURE SURGERY    . INGUINAL HERNIA REPAIR Left >3 times  . INGUINAL HERNIA REPAIR Right 2012  . NEPHRECTOMY Left 2000  . TONSILLECTOMY  1940s   Social History   Socioeconomic History  . Marital status: Widowed    Spouse name: Not on file  . Number of children: Not on file  . Years of education: Not on file  . Highest education level: Not on file  Social Needs  . Financial resource strain: Not on file  . Food insecurity - worry: Not on file  . Food insecurity - inability: Not on file  . Transportation needs - medical: Not on file  . Transportation needs - non-medical: Not on file  Occupational History  . Not on file  Tobacco  Use  . Smoking status: Former Smoker    Years: 20.00    Types: Cigarettes, Pipe, Cigars    Last attempt to quit: 09/09/1968    Years since quitting: 48.9  . Smokeless tobacco: Never Used  . Tobacco comment: smoking cigars- quit in 13   Substance and Sexual Activity  . Alcohol use: Yes    Alcohol/week: 0.6 oz    Types: 1 Cans of beer per week    Comment: occasionally   . Drug use: No  . Sexual activity: No  Other Topics Concern  . 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    Not married- widowed    Three children, One in [place], one in [place] one in [place]   No Known Allergies Family  History  Problem Relation Age of Onset  . Drug abuse Other   . Cancer Other   . Heart disease Other   . Lung disease Other   . Colon cancer Neg Hx   . Stomach cancer Neg Hx   . Diabetes Mother   . Diabetes Sister      Past medical history, social, surgical and family history all reviewed in electronic medical record.  No pertanent information unless stated regarding to the chief complaint.   Review of Systems:Review of systems updated and as accurate as of 08/05/17  No headache, visual changes, nausea, vomiting, diarrhea, constipation, dizziness, abdominal pain, skin rash, fevers, chills, night sweats, weight loss, swollen lymph nodes, body aches, joint swelling, , chest pain, shortness of breath, mood changes.  Positive muscle aches  Objective  There were no vitals taken for this visit. Systems examined below as of 08/05/17   General: No apparent distress alert and oriented x3 mood and affect normal, dressed appropriately.  HEENT: Pupils equal, extraocular movements intact  Respiratory: Patient's speak in full sentences and does not appear short of breath  Cardiovascular: No lower extremity edema, non tender, no erythema  Skin: Warm dry intact with no signs of infection or rash on extremities or on axial skeleton.  Abdomen: Soft nontender  Neuro: Cranial nerves II through XII are intact, neurovascularly intact in all extremities with 2+ DTRs and 2+ pulses.  Lymph: No lymphadenopathy of posterior or anterior cervical chain or axillae bilaterally.  Gait Palgic MSK:  Non tender with full range of motion and good stability and symmetric strength and tone of shoulders, elbows, wrist, hip, knee and ankles bilaterally.  Significant arthritic of multiple joints  Back Exam:  Inspection: Degenerative scoliosis noted Motion: Flexion 25 deg, Extension 15 deg, Side Bending to 25 deg bilaterally,  Rotation to 25 deg bilaterally  SLR laying: Negative severe tightness of the hamstrings  bilaterally left greater than right XSLR laying: Negative  Palpable tenderness: Tender to palpation in the paraspinal musculature of the lumbosacral area on the left side as well as the left piriformis and left gluteal tendon.  Still mild pain over the left greater trochanteric area. FABER: negative. Sensory change: Gross sensation intact to all lumbar and sacral dermatomes.  Reflexes: 2+ at both patellar tendons, 2+ at achilles tendons, Babinski's downgoing.  Strength at foot  4+ out of 5 strength on the left compared to the right side.   Impression and Recommendations:     This case required medical decision making of moderate complexity.      Note: This dictation was  prepared with Dragon dictation along with smaller phrase technology. Any transcriptional errors that result from this process are unintentional.

## 2017-08-06 ENCOUNTER — Ambulatory Visit (INDEPENDENT_AMBULATORY_CARE_PROVIDER_SITE_OTHER)
Admission: RE | Admit: 2017-08-06 | Discharge: 2017-08-06 | Disposition: A | Discharge status: Home / Self Care | Payer: Medicare Other | Source: Ambulatory Visit | Attending: Family Medicine | Admitting: Family Medicine

## 2017-08-06 ENCOUNTER — Ambulatory Visit: Payer: Medicare Other | Admitting: Family Medicine

## 2017-08-06 ENCOUNTER — Encounter: Payer: Self-pay | Admitting: Family Medicine

## 2017-08-06 VITALS — BP 136/84 | HR 82 | Ht 72.0 in | Wt 256.0 lb

## 2017-08-06 DIAGNOSIS — M4807 Spinal stenosis, lumbosacral region: Secondary | ICD-10-CM | POA: Diagnosis not present

## 2017-08-06 DIAGNOSIS — M5416 Radiculopathy, lumbar region: Secondary | ICD-10-CM | POA: Diagnosis not present

## 2017-08-06 DIAGNOSIS — M545 Low back pain, unspecified: Secondary | ICD-10-CM

## 2017-08-06 NOTE — Patient Instructions (Signed)
Good to see you  Lets get xray of the back  We need MRI with you being a candidate for an epidural injection IF we do the injection based on MRI then I want to see you again 2-3 weeks after the epidural  Happy holidays!

## 2017-08-06 NOTE — Assessment & Plan Note (Signed)
Patient is having some signs and symptoms that are consistent with more of a lumbar radiculopathy.  I do believe that the spinal stenosis is within the differential.  Patient is also had a history of renal cell carcinoma I do feel that advanced imaging would be warranted at this time especially with the weakness.  Patient could be a candidate for epidural steroid injections.  X-rays also ordered today to confirm the arthritic changes that was noted on the left hip x-ray indirectly.  We discussed different medications and patient would like to hold until the imaging to see if a possible injection would be more beneficial.  Patient has done all other conservative therapy including formal physical therapy previously.

## 2017-08-13 ENCOUNTER — Encounter: Payer: Self-pay | Admitting: Adult Health

## 2017-08-13 ENCOUNTER — Ambulatory Visit (INDEPENDENT_AMBULATORY_CARE_PROVIDER_SITE_OTHER): Payer: Medicare Other | Admitting: Adult Health

## 2017-08-13 ENCOUNTER — Telehealth: Payer: Self-pay

## 2017-08-13 VITALS — BP 138/86 | HR 96 | Temp 98.5°F | Ht 71.0 in | Wt 250.0 lb

## 2017-08-13 DIAGNOSIS — E6609 Other obesity due to excess calories: Secondary | ICD-10-CM

## 2017-08-13 DIAGNOSIS — I10 Essential (primary) hypertension: Secondary | ICD-10-CM | POA: Diagnosis not present

## 2017-08-13 DIAGNOSIS — Z6833 Body mass index (BMI) 33.0-33.9, adult: Secondary | ICD-10-CM | POA: Diagnosis not present

## 2017-08-13 DIAGNOSIS — E1165 Type 2 diabetes mellitus with hyperglycemia: Secondary | ICD-10-CM | POA: Diagnosis not present

## 2017-08-13 DIAGNOSIS — Z0000 Encounter for general adult medical examination without abnormal findings: Secondary | ICD-10-CM

## 2017-08-13 DIAGNOSIS — E1159 Type 2 diabetes mellitus with other circulatory complications: Secondary | ICD-10-CM

## 2017-08-13 DIAGNOSIS — E785 Hyperlipidemia, unspecified: Secondary | ICD-10-CM | POA: Diagnosis not present

## 2017-08-13 LAB — LIPID PANEL
CHOLESTEROL: 154 mg/dL (ref 0–200)
HDL: 50.7 mg/dL (ref >39.00)
LDL CALC: 73 mg/dL (ref 0–99)
NonHDL: 103.79
TRIGLYCERIDES: 155 mg/dL — AB (ref 0.0–149.0)
Total CHOL/HDL Ratio: 3
VLDL: 31 mg/dL (ref 0.0–40.0)

## 2017-08-13 LAB — HEPATIC FUNCTION PANEL
ALBUMIN: 4.3 g/dL (ref 3.5–5.2)
ALT: 34 U/L (ref 0–53)
AST: 33 U/L (ref 0–37)
Alkaline Phosphatase: 58 U/L (ref 39–117)
BILIRUBIN TOTAL: 0.5 mg/dL (ref 0.2–1.2)
Bilirubin, Direct: 0.1 mg/dL (ref 0.0–0.3)
Total Protein: 6.6 g/dL (ref 6.0–8.3)

## 2017-08-13 LAB — CBC WITH DIFFERENTIAL/PLATELET
Basophils Absolute: 0.1 10*3/uL (ref 0.0–0.1)
Basophils Relative: 1.8 % (ref 0.0–3.0)
EOS PCT: 5.1 % — AB (ref 0.0–5.0)
Eosinophils Absolute: 0.3 10*3/uL (ref 0.0–0.7)
HCT: 41.2 % (ref 39.0–52.0)
Hemoglobin: 13.3 g/dL (ref 13.0–17.0)
LYMPHS ABS: 1 10*3/uL (ref 0.7–4.0)
Lymphocytes Relative: 19.4 % (ref 12.0–46.0)
MCHC: 32.3 g/dL (ref 30.0–36.0)
MCV: 87.1 fl (ref 78.0–100.0)
MONO ABS: 0.6 10*3/uL (ref 0.1–1.0)
Monocytes Relative: 11.6 % (ref 3.0–12.0)
NEUTROS ABS: 3.3 10*3/uL (ref 1.4–7.7)
NEUTROS PCT: 62.1 % (ref 43.0–77.0)
PLATELETS: 245 10*3/uL (ref 150.0–400.0)
RBC: 4.73 Mil/uL (ref 4.22–5.81)
RDW: 18.1 % — AB (ref 11.5–15.5)
WBC: 5.3 10*3/uL (ref 4.0–10.5)

## 2017-08-13 LAB — PSA: PSA: 1.19 ng/mL (ref 0.10–4.00)

## 2017-08-13 LAB — BASIC METABOLIC PANEL
BUN: 16 mg/dL (ref 6–23)
CHLORIDE: 101 meq/L (ref 96–112)
CO2: 28 mEq/L (ref 19–32)
Calcium: 8.7 mg/dL (ref 8.4–10.5)
Creatinine, Ser: 1.06 mg/dL (ref 0.40–1.50)
GFR: 72.12 mL/min (ref >60.00)
Glucose, Bld: 170 mg/dL — ABNORMAL HIGH (ref 70–99)
Potassium: 4.7 mEq/L (ref 3.5–5.1)
SODIUM: 140 meq/L (ref 135–145)

## 2017-08-13 LAB — TSH: TSH: 2.25 u[IU]/mL (ref 0.35–4.50)

## 2017-08-13 NOTE — Patient Instructions (Signed)
It was great seeing you today   I will follow up with you regarding your labs   Please work on weight loss through diet and exercise   Follow up with me in one year or sooner if needed  Health Maintenance, Male A healthy lifestyle and preventative care can promote health and wellness.  Maintain regular health, dental, and eye exams.  Eat a healthy diet. Foods like vegetables, fruits, whole grains, low-fat dairy products, and lean protein foods contain the nutrients you need and are low in calories. Decrease your intake of foods high in solid fats, added sugars, and salt. Get information about a proper diet from your health care provider, if necessary.  Regular physical exercise is one of the most important things you can do for your health. Most adults should get at least 150 minutes of moderate-intensity exercise (any activity that increases your heart rate and causes you to sweat) each week. In addition, most adults need muscle-strengthening exercises on 2 or more days a week.   Maintain a healthy weight. The body mass index (BMI) is a screening tool to identify possible weight problems. It provides an estimate of body fat based on height and weight. Your health care provider can find your BMI and can help you achieve or maintain a healthy weight. For males 20 years and older:  A BMI below 18.5 is considered underweight.  A BMI of 18.5 to 24.9 is normal.  A BMI of 25 to 29.9 is considered overweight.  A BMI of 30 and above is considered obese.  Maintain normal blood lipids and cholesterol by exercising and minimizing your intake of saturated fat. Eat a balanced diet with plenty of fruits and vegetables. Blood tests for lipids and cholesterol should begin at age 109 and be repeated every 5 years. If your lipid or cholesterol levels are high, you are over age 65, or you are at high risk for heart disease, you may need your cholesterol levels checked more frequently.Ongoing high lipid and  cholesterol levels should be treated with medicines if diet and exercise are not working.  If you smoke, find out from your health care provider how to quit. If you do not use tobacco, do not start.  Lung cancer screening is recommended for adults aged 33-80 years who are at high risk for developing lung cancer because of a history of smoking. A yearly low-dose CT scan of the lungs is recommended for people who have at least a 30-pack-year history of smoking and are current smokers or have quit within the past 15 years. A pack year of smoking is smoking an average of 1 pack of cigarettes a day for 1 year (for example, a 30-pack-year history of smoking could mean smoking 1 pack a day for 30 years or 2 packs a day for 15 years). Yearly screening should continue until the smoker has stopped smoking for at least 15 years. Yearly screening should be stopped for people who develop a health problem that would prevent them from having lung cancer treatment.  If you choose to drink alcohol, do not have more than 2 drinks per day. One drink is considered to be 12 oz (360 mL) of beer, 5 oz (150 mL) of wine, or 1.5 oz (45 mL) of liquor.  Avoid the use of street drugs. Do not share needles with anyone. Ask for help if you need support or instructions about stopping the use of drugs.  High blood pressure causes heart disease and increases the  risk of stroke. High blood pressure is more likely to develop in:  People who have blood pressure in the end of the normal range (100-139/85-89 mm Hg).  People who are overweight or obese.  People who are African American.  If you are 59-46 years of age, have your blood pressure checked every 3-5 years. If you are 71 years of age or older, have your blood pressure checked every year. You should have your blood pressure measured twice--once when you are at a hospital or clinic, and once when you are not at a hospital or clinic. Record the average of the two measurements. To  check your blood pressure when you are not at a hospital or clinic, you can use:  An automated blood pressure machine at a pharmacy.  A home blood pressure monitor.  If you are 57-11 years old, ask your health care provider if you should take aspirin to prevent heart disease.  Diabetes screening involves taking a blood sample to check your fasting blood sugar level. This should be done once every 3 years after age 31 if you are at a normal weight and without risk factors for diabetes. Testing should be considered at a younger age or be carried out more frequently if you are overweight and have at least 1 risk factor for diabetes.  Colorectal cancer can be detected and often prevented. Most routine colorectal cancer screening begins at the age of 30 and continues through age 70. However, your health care provider may recommend screening at an earlier age if you have risk factors for colon cancer. On a yearly basis, your health care provider may provide home test kits to check for hidden blood in the stool. A small camera at the end of a tube may be used to directly examine the colon (sigmoidoscopy or colonoscopy) to detect the earliest forms of colorectal cancer. Talk to your health care provider about this at age 76 when routine screening begins. A direct exam of the colon should be repeated every 5-10 years through age 43, unless early forms of precancerous polyps or small growths are found.  People who are at an increased risk for hepatitis B should be screened for this virus. You are considered at high risk for hepatitis B if:  You were born in a country where hepatitis B occurs often. Talk with your health care provider about which countries are considered high risk.  Your parents were born in a high-risk country and you have not received a shot to protect against hepatitis B (hepatitis B vaccine).  You have HIV or AIDS.  You use needles to inject street drugs.  You live with, or have sex  with, someone who has hepatitis B.  You are a man who has sex with other men (MSM).  You get hemodialysis treatment.  You take certain medicines for conditions like cancer, organ transplantation, and autoimmune conditions.  Hepatitis C blood testing is recommended for all people born from [birth year] through 1965 and any individual with known risk factors for hepatitis C.  Healthy men should no longer receive prostate-specific antigen (PSA) blood tests as part of routine cancer screening. Talk to your health care provider about prostate cancer screening.  Testicular cancer screening is not recommended for adolescents or adult males who have no symptoms. Screening includes self-exam, a health care provider exam, and other screening tests. Consult with your health care provider about any symptoms you have or any concerns you have about testicular cancer.  Practice safe  sex. Use condoms and avoid high-risk sexual practices to reduce the spread of sexually transmitted infections (STIs).  You should be screened for STIs, including gonorrhea and chlamydia if:  You are sexually active and are younger than 24 years.  You are older than 24 years, and your health care provider tells you that you are at risk for this type of infection.  Your sexual activity has changed since you were last screened, and you are at an increased risk for chlamydia or gonorrhea. Ask your health care provider if you are at risk.  If you are at risk of being infected with HIV, it is recommended that you take a prescription medicine daily to prevent HIV infection. This is called pre-exposure prophylaxis (PrEP). You are considered at risk if:  You are a man who has sex with other men (MSM).  You are a heterosexual man who is sexually active with multiple partners.  You take drugs by injection.  You are sexually active with a partner who has HIV.  Talk with your health care provider about whether you are at high risk of being  infected with HIV. If you choose to begin PrEP, you should first be tested for HIV. You should then be tested every 3 months for as long as you are taking PrEP.  Use sunscreen. Apply sunscreen liberally and repeatedly throughout the day. You should seek shade when your shadow is shorter than you. Protect yourself by wearing long sleeves, pants, a wide-brimmed hat, and sunglasses year round whenever you are outdoors.  Tell your health care provider of new moles or changes in moles, especially if there is a change in shape or color. Also, tell your health care provider if a mole is larger than the size of a pencil eraser.  A one-time screening for abdominal aortic aneurysm (AAA) and surgical repair of large AAAs by ultrasound is recommended for men aged 4-75 years who are current or former smokers.  Stay current with your vaccines (immunizations).   This information is not intended to replace advice given to you by your health care provider. Make sure you discuss any questions you have with your health care provider.   Document Released: 02/22/2008 Document Revised: 09/16/2014 Document Reviewed: 01/21/2011 Elsevier Interactive Patient Education [employer].

## 2017-08-13 NOTE — Progress Notes (Signed)
Subjective:    Patient ID: Charles [name], male    DOB: Jan 22, 1941, 76 y.o.   MRN: [id number]  HPI  Patient presents for yearly preventative medicine examination. He is a pleasant 76 year old male who  has a past medical history of Anemia, Arthritis, AVM (arteriovenous malformation) of colon, Biceps muscle tear, Cancer of kidney ([place]) (2000), Coronary artery disease, Eczema, GERD (gastroesophageal reflux disease), Hernia, ventral, Hyperlipidemia, Hypertension, Obesity, Personal history of adenomatous colonic polyps (07/20/2012), Pneumonia (1942; 1950s X 1), Type II diabetes mellitus ([place]), and Walking pneumonia (2000's X 1).  He takes lipitor and ASA for hyperlipidemia   He takes lisinopril and Metoprolol for hypertension   He takes Glipizide 2.5 mg BID and Metformin 1000 mg BID for diabetes - he is seen by Endocrinology.  Lab Results  Component Value Date   HGBA1C 7.2 06/10/2017   He is being seen by Cardiology tomorrow  He has also been seen by Dr. Charlann Boxer for left hip pain. Received a cortisone shot which he reports did not help. He has been doing stretching at home and about 3 days ago noticed that he is no longer having pain in his left hip.   All immunizations and health maintenance protocols were reviewed with the patient and needed orders were placed.  Appropriate screening laboratory values were ordered for the patient including screening of hyperlipidemia, renal function and hepatic function. If indicated by BPH, a PSA was ordered.  Medication reconciliation,  past medical history, social history, problem list and allergies were reviewed in detail with the patient  Goals were established with regard to weight loss, exercise, and  diet in compliance with medications. He is working out at [employer] multiple days per week and tries to eat healthy.  Wt Readings from Last 3 Encounters:  08/13/17 250 lb (113.4 kg)  08/06/17 256 lb (116.1 kg)  07/09/17 256 lb (116.1 kg)     End of life planning was discussed. He has an advanced directive and living will  He is due this year for his colonoscopy. He is UTD on dental and vision checks.    Review of Systems  Constitutional: Negative.   HENT: Negative.   Eyes: Negative.   Respiratory: Negative.   Cardiovascular: Negative.   Gastrointestinal: Negative.   Endocrine: Negative.   Genitourinary: Negative.   Musculoskeletal: Positive for arthralgias.  Skin: Negative.   Allergic/Immunologic: Negative.   Neurological: Negative.   Hematological: Negative.   Psychiatric/Behavioral: Negative.   All other systems reviewed and are negative.  Past Medical History:  Diagnosis Date  . Anemia    "when I was a lad"  . Arthritis    "right femur" (10/19/2015)  . AVM (arteriovenous malformation) of colon    2 - non-bleeding 2013  . Biceps muscle tear    right  . Cancer of kidney ([place]) 2000   left nephrectomy  . Coronary artery disease   . Eczema   . GERD (gastroesophageal reflux disease)   . Hernia, ventral   . Hyperlipidemia   . Hypertension   . Obesity   . Personal history of adenomatous colonic polyps 07/20/2012   3 + adenomas 2009 07/20/2012 2 diminutive polyps    . Pneumonia 1942; 1950s X 1  . Type II diabetes mellitus ([place])   . Walking pneumonia 2000's X 1    Social History   Socioeconomic History  . Marital status: Widowed    Spouse name: Not on file  . Number of children:  Not on file  . Years of education: Not on file  . Highest education level: Not on file  Social Needs  . Financial resource strain: Not on file  . Food insecurity - worry: Not on file  . Food insecurity - inability: Not on file  . Transportation needs - medical: Not on file  . Transportation needs - non-medical: Not on file  Occupational History  . Not on file  Tobacco Use  . Smoking status: Former Smoker    Years: 20.00    Types: Cigarettes, Pipe, Cigars    Last attempt to quit: 09/09/1968    Years since quitting:  48.9  . Smokeless tobacco: Never Used  . Tobacco comment: smoking cigars- quit in 13   Substance and Sexual Activity  . Alcohol use: Yes    Alcohol/week: 0.6 oz    Types: 1 Cans of beer per week    Comment: occasionally   . Drug use: No  . Sexual activity: No  Other Topics Concern  . 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    Not married- widowed    Three children, One in [place], one in [place] one in [place]    Past Surgical History:  Procedure Laterality Date  . CARDIAC CATHETERIZATION N/A 10/19/2015   Procedure: Left Heart Cath and Coronary Angiography;  Surgeon: Charolette Forward, MD;  Location: [facility];  Service: Cardiovascular;  Laterality: N/A;  . CARDIAC CATHETERIZATION N/A 10/19/2015   Procedure: Coronary Stent Intervention;  Surgeon: Charolette Forward, MD;  Location: [facility];  Service: Cardiovascular;  Laterality: N/A;  . COLONOSCOPY     "nothing showed up this time"  . COLONOSCOPY W/ BIOPSIES AND POLYPECTOMY  X 1  . CORONARY ANGIOPLASTY    . FEMUR IM NAIL Right 07/26/2013   Procedure: INTRAMEDULLARY (IM) NAIL FEMORAL subtrochanteric;  Surgeon: Mauri Pole, MD;  Location: [facility];  Service: Orthopedics;  Laterality: Right;  . FRACTURE SURGERY    . INGUINAL HERNIA REPAIR Left >3 times  . INGUINAL HERNIA REPAIR Right 2012  . NEPHRECTOMY Left 2000  . TONSILLECTOMY  1940s    Family History  Problem Relation Age of Onset  . Drug abuse Other   . Cancer Other   . Heart disease Other   . Lung disease Other   . Colon cancer Neg Hx   . Stomach cancer Neg Hx   . Diabetes Mother   . Diabetes Sister     No Known Allergies  Current Outpatient Medications on File Prior to Visit  Medication Sig Dispense Refill  . aspirin 81 MG chewable tablet Chew 1 tablet (81 mg total) by mouth daily.    Marland Kitchen atorvastatin (LIPITOR) 40 MG tablet Take 1  tablet (40 mg total) by mouth daily at 6 PM. 30 tablet 3  . Bioflavonoid Products (VITAMIN C) CHEW Chew by mouth.    . cyanocobalamin 1000 MCG tablet Take 5,000 mcg by mouth daily.    . cyclobenzaprine (FLEXERIL) 10 MG tablet Take 1 tablet (10 mg total) by mouth 3 (three) times daily as needed for muscle spasms. 30 tablet 0  . EPINEPHrine (EPIPEN 2-PAK) 0.3 mg/0.3 mL DEVI Inject 0.3 mg into the muscle once.      Marland Kitchen glipiZIDE (GLUCOTROL) 5 MG tablet Take 0.5 tablets (2.5 mg total) by mouth 2 (two) times daily before a  meal. 90 tablet 2  . glucose blood (ONETOUCH VERIO) test strip USE ONE STRIP TO CHECK GLUCOSE ONCE DAILY 100 each 5  . lisinopril (PRINIVIL,ZESTRIL) 5 MG tablet Take 1 tablet (5 mg total) by mouth daily. 30 tablet 3  . metFORMIN (GLUCOPHAGE) 1000 MG tablet Take 1 tablet by mouth two  times daily with meals 360 tablet 3  . metoprolol succinate (TOPROL-XL) 50 MG 24 hr tablet     . Multiple Vitamins-Minerals (ICAPS PO) Take 1 tablet by mouth daily.    . nitroGLYCERIN (NITROSTAT) 0.4 MG SL tablet Place 1 tablet (0.4 mg total) under the tongue every 5 (five) minutes as needed for chest pain. 25 tablet 1  . ONETOUCH DELICA LANCETS FINE MISC Use to check sugar daily 100 each 5   No current facility-administered medications on file prior to visit.     There were no vitals taken for this visit.      Objective:   Physical Exam  Constitutional: He is oriented to person, place, and time. He appears well-developed and well-nourished. No distress.  Obese   HENT:  Head: Normocephalic and atraumatic.  Right Ear: External ear normal.  Left Ear: External ear normal.  Nose: Nose normal.  Mouth/Throat: Oropharynx is clear and moist. No oropharyngeal exudate.  Eyes: Conjunctivae and EOM are normal. Pupils are equal, round, and reactive to light. Right eye exhibits no discharge. Left eye exhibits no discharge. No scleral icterus.  Neck: Normal range of motion. Neck supple. No JVD present. No  tracheal deviation present. No thyromegaly present.  Cardiovascular: Normal rate, regular rhythm, normal heart sounds and intact distal pulses. Exam reveals no gallop and no friction rub.  No murmur heard. Pulmonary/Chest: Effort normal and breath sounds normal. No stridor. No respiratory distress. He has no wheezes. He has no rales. He exhibits no tenderness.  Abdominal: Soft. Bowel sounds are normal. He exhibits no distension and no mass. There is no tenderness. There is no rebound and no guarding.  Large hiatal hernia    Genitourinary:  Genitourinary Comments: Deferred: Will do PSA  Musculoskeletal: Normal range of motion. He exhibits no edema, tenderness or deformity.  Lymphadenopathy:    He has no cervical adenopathy.  Neurological: He is alert and oriented to person, place, and time. He has normal reflexes. He displays normal reflexes. No cranial nerve deficit. He exhibits normal muscle tone. Coordination normal.  Skin: Skin is warm and dry. No rash noted. He is not diaphoretic. No erythema. No pallor.  Scattered AK's and skin tags throughout torso   Psychiatric: He has a normal mood and affect. His behavior is normal. Judgment and thought content normal.  Nursing note and vitals reviewed.     Assessment & Plan:  1. Routine general medical examination at a health care facility - Basic metabolic panel - CBC with Differential/Platelet - Hepatic function panel - Lipid panel - TSH - PSA  2. Essential hypertension - Near goal.  - Basic metabolic panel - CBC with Differential/Platelet - Hepatic function panel - Lipid panel - TSH - PSA  3. 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[place]) - Follow up with Endocrinology  - Needs to lose weight  - Foot exam done  - Basic metabolic panel - CBC with Differential/Platelet - Hepatic function panel - Lipid panel - TSH - PSA  4. Hyperlipidemia, unspecified hyperlipidemia type - Consider increasing  statin  - Basic metabolic panel - CBC with Differential/Platelet - Hepatic function panel - Lipid panel -  TSH - PSA  5. Class 1 obesity due to excess calories with serious comorbidity and body mass index (BMI) of 33.0 to 33.9 in adult - Encouraged weight loss through diet and exercise  - Basic metabolic panel - CBC with Differential/Platelet - Hepatic function panel - Lipid panel - TSH - PSA  [name], NP

## 2017-08-13 NOTE — Telephone Encounter (Signed)
Called patient as I received a message that he did not want to get MRI. Patient states that his pain is gone after doing stretches daily. Per a verbal from Dr. Tamala Julian, patient is able to hold off on MRI but is to let us know if he does develop the pain again. Patient voices understanding and will call to schedule in the new year if he has troubles.

## 2017-08-13 NOTE — Telephone Encounter (Signed)
Patient walked in and stated he does not want the MRI anymore, states his pinched nerve has gone away, he has done some stretching,  If Dr. Tamala Julian will agree to it he would like for him to cancel the MRI order.  Please advise

## 2017-08-14 ENCOUNTER — Other Ambulatory Visit: Payer: Self-pay | Admitting: Internal Medicine

## 2017-08-15 ENCOUNTER — Other Ambulatory Visit: Payer: Medicare Other

## 2017-09-05 ENCOUNTER — Encounter: Payer: Self-pay | Admitting: Internal Medicine

## 2017-09-11 ENCOUNTER — Encounter: Payer: Self-pay | Admitting: Internal Medicine

## 2017-09-11 ENCOUNTER — Ambulatory Visit: Payer: Medicare Other | Admitting: Internal Medicine

## 2017-09-11 VITALS — BP 130/82 | HR 90 | Ht 71.0 in | Wt 253.7 lb

## 2017-09-11 DIAGNOSIS — E1159 Type 2 diabetes mellitus with other circulatory complications: Secondary | ICD-10-CM | POA: Diagnosis not present

## 2017-09-11 DIAGNOSIS — E538 Deficiency of other specified B group vitamins: Secondary | ICD-10-CM | POA: Diagnosis not present

## 2017-09-11 DIAGNOSIS — E1165 Type 2 diabetes mellitus with hyperglycemia: Secondary | ICD-10-CM

## 2017-09-11 DIAGNOSIS — E785 Hyperlipidemia, unspecified: Secondary | ICD-10-CM

## 2017-09-11 DIAGNOSIS — E1122 Type 2 diabetes mellitus with diabetic chronic kidney disease: Secondary | ICD-10-CM | POA: Diagnosis not present

## 2017-09-11 DIAGNOSIS — N182 Chronic kidney disease, stage 2 (mild): Secondary | ICD-10-CM

## 2017-09-11 LAB — POCT GLYCOSYLATED HEMOGLOBIN (HGB A1C): HEMOGLOBIN A1C: 7.6

## 2017-09-11 MED ORDER — GLIPIZIDE 5 MG PO TABS: 5.0000 mg | ORAL_TABLET | Freq: Two times a day (BID) | ORAL | 3 refills | Status: DC

## 2017-09-11 NOTE — Patient Instructions (Signed)
Please continue: - Metformin 1000 mg 2x a day - Glipizide 5 mg 2x a day before meals, except you may need 2.5 mg in a.m. the days to go to the gym  Please check with your insurance if they cover: - Januvia - Tradjenta - Alogliptin - [place]  Please return in 3 months with your sugar log.

## 2017-09-11 NOTE — Addendum Note (Signed)
Addended by: Drucilla Schmidt on: 09/11/2017 10:15 AM   Modules accepted: Orders

## 2017-09-11 NOTE — Progress Notes (Signed)
Patient ID: Charles [name], male   DOB: [birth year], 77 y.o.   MRN: [id number]  HPI: Charles [name] is a 77 y.o.-year-old male, returning for f/u for DM2 dx 2008, non-insulin-dependent, uncontrolled, with complications (CAD - s/p NSTEMI, PN, mild CKD). Last visit 77 months ago.  He had a steroid inj in 07/2017 >> sugars higher for ~1 week: 250-300.  DM2: Last hemoglobin A1c was: Lab Results  Component Value Date   HGBA1C 7.2 06/10/2017   HGBA1C 6.9 02/17/2017   HGBA1C 6.6 10/22/2016  12/2013: 8.6%  Pt is on a regimen of: - Metformin 1000 mg 2x a day with meals - Glipizide 5 mg 2x a day before meals Not doing this.  Pt checks his sugars once a day: - am: 110-150 >> 112, 120s-160s (130-140 usually) >> 96-177, last few days 111-146 - 2h after b'fast:78-116 >> n/c >> 68x1 >> n/c - before lunch: 79-90, 157 >> n/c - 2h after lunch: 123-176 >> n/c - before dinner: 57, 106 >> n/c >> 160s >> n/c - 2h after dinner: 173-197 >> n/c >> 240 - bedtime:125-135 >> 110-150 >> 160s >> n/c Lowest sugar was  100 >> 96; he has hypoglycemia awareness in the 80s. Highest sugar was 204 >> 180 >> 240.  - + Mild CKD, last BUN/creatinine:  Lab Results  Component Value Date   BUN 16 08/13/2017   CREATININE 1.06 08/13/2017  Of note, he has a history of nephrectomy. On lisinopril - increased from 5 to 10 mg..  -+ HL; last set of lipids: Lab Results  Component Value Date   CHOL 154 08/13/2017   HDL 50.70 08/13/2017   LDLCALC 73 08/13/2017   LDLDIRECT 139.8 07/08/2014   TRIG 155.0 (H) 08/13/2017   CHOLHDL 3 08/13/2017  On Lipitor 40. - last eye exam was in 11/2016: No DR - Dr Delman Cheadle. - Improved numbness and tingling in his feet >> improved.  B12 deficiency: Component     Latest Ref Rng 10/13/2015  Vitamin B12     211 - 911 pg/mL 94 (L)   He was on B12 Im inj >> currently on 5000 mcg p.o. daily.  Latest level was high: Lab Results  Component Value Date   VITAMINB12 1,249 (H) 02/18/2017   VITAMINB12 234 10/22/2016   VITAMINB12 217 04/19/2016   VITAMINB12 94 (L) 10/13/2015   He was admitted with NSTEMI 10/18/2015.  He had a stent placed then.  He goes to the gym 5 out of 7 days.  ROS: Constitutional: no weight gain/no weight loss, no fatigue, no subjective hyperthermia, no subjective hypothermia, + nocturia Eyes: no blurry vision, no xerophthalmia ENT: no sore throat, no nodules palpated in throat, no dysphagia, no odynophagia, no hoarseness Cardiovascular: no CP/no SOB/no palpitations/no leg swelling Respiratory: no cough/no SOB/no wheezing Gastrointestinal: no N/no V/no D/no C/no acid reflux Musculoskeletal: no muscle aches/no joint aches Skin: no rashes, no hair loss Neurological: no tremors/no numbness/no tingling/no dizziness  I reviewed pt's medications, allergies, PMH, social hx, family hx, and changes were documented in the history of present illness. Otherwise, unchanged from my initial visit note.  PE: BP 130/82   Pulse 90   Ht 5\' 11"  (1.803 m)   Wt 253 lb 11.2 oz (115.1 kg)   SpO2 94%   BMI 35.38 kg/m  Wt Readings from Last 3 Encounters:  09/11/17 253 lb 11.2 oz (115.1 kg)  08/13/17 250 lb (113.4 kg)  08/06/17 256 lb (116.1 kg)   Constitutional: overweight, in NAD  Eyes: PERRLA, EOMI, no exophthalmos ENT: moist mucous membranes, no thyromegaly, no cervical lymphadenopathy Cardiovascular: RRR, No MRG Respiratory: CTA B Gastrointestinal: abdomen soft, NT, ND, BS+ Musculoskeletal: no deformities, strength intact in all 4 Skin: moist, warm, no rashes Neurological: no tremor with outstretched hands, DTR normal in all 4   ASSESSMENT: 1. DM2, non-insulin-dependent, now controlled, with complications - CAD - s/p NSTEMI (Dr. Terrence Dupont) - peripheral neuropathy - CKD  2. B12 def  3. HL  PLAN:  1. Patient with long-standing, fairly well-controlled type 2 diabetes, worse at last visit after dietary indiscretions.  Since he could not afford to start  SGLT2 inhibitors, I gave him a list of DPP 4 inhibitors at last visit to check with his insurance whether they are covered or not >> not covered, but I advised them to recheck maybe coverage has changed in the new year.  Until then, we increased his glipizide but also discussed about improving diet: Lower fat and concentrated sweets - at this visit, 2/2 Holidays >> sugars are high. He also had a steroid inj in 07/2017 and sugars were much higher then. He is now starting to get back on his diet >> no changes are needed in his regimen. - I advised him to: Patient Instructions  Please continue: - Metformin 1000 mg 2x a day - Glipizide 5 mg 2x a day before meals, except you may need 2.5 mg in a.m. the days to go to the gym  Please check with your insurance if they cover: - Januvia - Tradjenta - Alogliptin - Nesina - Onglyza  Please return in 3 months with your sugar log.  - today, HbA1c is 7.6% (higher) - continue checking sugars at different times of the day - check 1x a day, rotating checks - advised for yearly eye exams >> he is UTD - Return to clinic in 3 mo with sugar log   2. B12 def. - since he was on Metformin, we checked a B12 vitamin in 10/2015: Very low - He was on B12 injections monthly, then we switched to 5000 mcg B12 p.o. Daily - Latest B12 level was normal; continue 5000 mcg B12 daily  3. HL - Reviewed latest Lipid panel from 1 mo ago >> LDL much better, with higher TGs - continues Lipitor w/o SEs  Philemon Kingdom, MD PhD [facility]

## 2017-09-30 ENCOUNTER — Telehealth: Payer: Self-pay | Admitting: Internal Medicine

## 2017-09-30 ENCOUNTER — Other Ambulatory Visit: Payer: Self-pay

## 2017-09-30 MED ORDER — METFORMIN HCL 1000 MG PO TABS: ORAL_TABLET | ORAL | 0 refills | Status: DC

## 2017-09-30 NOTE — Telephone Encounter (Signed)
Script sent to mail order 

## 2017-09-30 NOTE — Telephone Encounter (Signed)
Patient needs script for Metformin-90 day supply sent to [facility] mail order  FYI-Patient is unable to request refills on MyChart-MyChart states he cannot request refills on any of his medications-it won't allow him to click on the meds

## 2017-11-12 LAB — HM DIABETES EYE EXAM

## 2017-11-20 LAB — HM DIABETES EYE EXAM

## 2017-12-11 ENCOUNTER — Ambulatory Visit: Payer: Medicare Other | Admitting: Internal Medicine

## 2017-12-11 ENCOUNTER — Encounter: Payer: Self-pay | Admitting: Internal Medicine

## 2017-12-11 VITALS — BP 154/96 | HR 80 | Ht 71.0 in | Wt 247.8 lb

## 2017-12-11 DIAGNOSIS — Z6834 Body mass index (BMI) 34.0-34.9, adult: Secondary | ICD-10-CM | POA: Diagnosis not present

## 2017-12-11 DIAGNOSIS — E1159 Type 2 diabetes mellitus with other circulatory complications: Secondary | ICD-10-CM | POA: Diagnosis not present

## 2017-12-11 DIAGNOSIS — E1165 Type 2 diabetes mellitus with hyperglycemia: Secondary | ICD-10-CM | POA: Diagnosis not present

## 2017-12-11 DIAGNOSIS — E538 Deficiency of other specified B group vitamins: Secondary | ICD-10-CM | POA: Diagnosis not present

## 2017-12-11 DIAGNOSIS — E6609 Other obesity due to excess calories: Secondary | ICD-10-CM

## 2017-12-11 DIAGNOSIS — E785 Hyperlipidemia, unspecified: Secondary | ICD-10-CM | POA: Diagnosis not present

## 2017-12-11 LAB — POCT GLYCOSYLATED HEMOGLOBIN (HGB A1C): HEMOGLOBIN A1C: 7

## 2017-12-11 MED ORDER — METFORMIN HCL 1000 MG PO TABS: ORAL_TABLET | ORAL | 3 refills | Status: DC

## 2017-12-11 NOTE — Patient Instructions (Addendum)
Please  continue: - Metformin 1000 mg 2x a day  Please continue: - Glipizide 2.5 mg 2x a day before meals, except you may need 5 mg before a large meal  Please return in 4  months with your sugar log.

## 2017-12-11 NOTE — Progress Notes (Signed)
Patient ID: Charles [name], male   DOB: 12-11-1940, 77 y.o.   MRN: [id number]  HPI: Charles [name] is a 77 y.o.-year-old male, returning for f/u for DM2 dx 2008, non-insulin-dependent, uncontrolled, with complications (CAD - s/p NSTEMI, PN, mild CKD). Last visit 3 months ago.  He changed his diet 11/2017: reduces sweets, does not eat before meals.  DM2: Last hemoglobin A1c was: Lab Results  Component Value Date   HGBA1C 7.6 09/11/2017   HGBA1C 7.2 06/10/2017   HGBA1C 6.9 02/17/2017  12/2013: 8.6%  Pt is on a regimen of: - Metformin 1000 mg 2x a day with meals - Glipizide 2.5 mg 2x a day before meals (decreased as he had lower CBGs before lunch)  Pt checks his sugars once a day: - am:  96-177 >> 100-156, 171 (late dinner, pizza) - 2h after b'fast:78-116 >> n/c >> 68x1 >> n/c - before lunch: 79-90, 157 >> n/c >> 69, 79 - 2h after lunch: 123-176 >> n/c - before dinner: 57, 106 >> n/c >> 160s >> n/c - 2h after dinner: 173-197 >> n/c >> 240 >> n/c - bedtime:125-135 >> 110-150 >> 160s >> n/c Lowest sugar was  96 >> 69; he has hypoglycemia awareness in the 80s. Highest sugar was 240 >> 171  -+ Mild CKD, last BUN/creatinine:  Lab Results  Component Value Date   BUN 16 08/13/2017   CREATININE 1.06 08/13/2017  Of note, he has a history of nephrectomy. On lisinopril 10.  -+ HL; last set of lipids: Lab Results  Component Value Date   CHOL 154 08/13/2017   HDL 50.70 08/13/2017   LDLCALC 73 08/13/2017   LDLDIRECT 139.8 07/08/2014   TRIG 155.0 (H) 08/13/2017   CHOLHDL 3 08/13/2017  On Lipitor 40. - last eye exam was in 11/2017: No DR, changed from Dr Charles [name] >> Dr. Syrian Arab [name] - retinal vein occlusion, + cataracts, + macular "hole" -Improved numbness and tingling in his feet   B12 deficiency:  He had a very low vitamin B12 2 years ago: Component     Latest Ref Rng 10/13/2015  Vitamin B12     211 - 911 pg/mL 94 (L)   He was on B12 Im inj >> currently on 5000 mcg p.o. daily.  Latest level  was high: Lab Results  Component Value Date   VITAMINB12 1,249 (H) 02/18/2017   VITAMINB12 234 10/22/2016   VITAMINB12 217 04/19/2016   VITAMINB12 94 (L) 10/13/2015   He was admitted with NSTEMI 10/18/2015.  He had the stents placed then  He goes to the gym 5 out of 7 days  ROS: Constitutional: no weight gain/no weight loss, no fatigue, no subjective hyperthermia, no subjective hypothermia Eyes: no blurry vision, no xerophthalmia ENT: no sore throat, no nodules palpated in throat, no dysphagia, no odynophagia, no hoarseness Cardiovascular: no CP/no SOB/no palpitations/no leg swelling Respiratory: no cough/no SOB/no wheezing Gastrointestinal: no N/no V/no D/no C/no acid reflux Musculoskeletal: + muscle aches/no joint aches Skin: no rashes, no hair loss Neurological: no tremors/no numbness/no tingling/no dizziness  I reviewed pt's medications, allergies, PMH, social hx, family hx, and changes were documented in the history of present illness. Otherwise, unchanged from my initial visit note.  PE: BP (!) 154/96   Pulse 80   Ht 5\' 11"  (1.803 m)   Wt 247 lb 12.8 oz (112.4 kg)   SpO2 99%   BMI 34.56 kg/m  Wt Readings from Last 3 Encounters:  12/11/17 247 lb 12.8 oz (112.4 kg)  09/11/17 253 lb 11.2 oz (115.1 kg)  08/13/17 250 lb (113.4 kg)   Constitutional: overweight, in NAD Eyes: PERRLA, EOMI, no exophthalmos ENT: moist mucous membranes, no thyromegaly, no cervical lymphadenopathy Cardiovascular: RRR, No MRG Respiratory: CTA B Gastrointestinal: abdomen soft, NT, ND, BS+ Musculoskeletal: no deformities, strength intact in all 4 Skin: moist, warm, no rashes Neurological: no tremor with outstretched hands, DTR normal in all 4   ASSESSMENT: 1. DM2, non-insulin-dependent, uncontrolled, with complications - CAD - s/p NSTEMI (Dr. Terrence Dupont) - peripheral neuropathy - CKD  2. B12 def  3. HL  4. Obesity class 1 BMI Classification:  < 18.5 underweight   18.5-24.9 normal  weight   25.0-29.9 overweight   30.0-34.9 class I obesity   35.0-39.9 class II obesity   ? 40.0 class III obesity   PLAN:  1. Patient with long-standing, uncontrolled type 2 diabetes, with worse HbA1c at last visit possibly due to the holidays and a steroid injection in 07/2017.  At last visit, he was just starting to get back on his diet so we did not change his regimen, but I advised him to check with his insurance whether they would cover a DPP 4 inhibitor.  He could not afford SGLT2 inhibitors in the past we also discussed about improving diet: Lower fat and concentrated sweets.  - I advised him to: Patient Instructions  Please  continue: - Metformin 1000 mg 2x a day  Please continue: - Glipizide 2.5 mg 2x a day before meals, except you may need 5 mg before a large meal  Please return in 4  months with your sugar log.  - today, HbA1c is 7.0% (better) - continue checking sugars at different times of the day - check 1x a day, rotating checks - advised for yearly eye exams >> he is UTD - refilled Metformin - Return to clinic in 4 mo with sugar log    2. B12 def. - As he was on metformin, we checked a B12 level and this was low in 10/2015 - He was on B12 injections, then switched to 5000 mcg B12 daily - Latest B12 level was elevated 02/2017.  3. HL -Reviewed latest lipid panel from 08/2017: LDL much better, but higher triglycerides  -Continues Lipitor without side effects.  3. Obesity class 1 - lost 6 lbs already after 1 mo on the new diet - strongly advised him to continue  Philemon Kingdom, MD PhD [facility]

## 2018-01-01 ENCOUNTER — Other Ambulatory Visit: Payer: Self-pay | Admitting: Internal Medicine

## 2018-01-07 HISTORY — PX: CATARACT EXTRACTION, BILATERAL: SHX1313

## 2018-05-04 ENCOUNTER — Encounter: Payer: Self-pay | Admitting: Internal Medicine

## 2018-05-04 ENCOUNTER — Ambulatory Visit: Payer: Medicare Other | Admitting: Internal Medicine

## 2018-05-04 VITALS — BP 142/80 | HR 79 | Ht 71.0 in | Wt 248.6 lb

## 2018-05-04 DIAGNOSIS — E785 Hyperlipidemia, unspecified: Secondary | ICD-10-CM

## 2018-05-04 DIAGNOSIS — E538 Deficiency of other specified B group vitamins: Secondary | ICD-10-CM | POA: Diagnosis not present

## 2018-05-04 DIAGNOSIS — E1165 Type 2 diabetes mellitus with hyperglycemia: Secondary | ICD-10-CM

## 2018-05-04 DIAGNOSIS — E1159 Type 2 diabetes mellitus with other circulatory complications: Secondary | ICD-10-CM | POA: Diagnosis not present

## 2018-05-04 LAB — POCT GLYCOSYLATED HEMOGLOBIN (HGB A1C): Hemoglobin A1C: 7 % — AB (ref 4.0–5.6)

## 2018-05-04 LAB — VITAMIN B12: Vitamin B-12: >1500 pg/mL — ABNORMAL HIGH (ref 211–911)

## 2018-05-04 NOTE — Patient Instructions (Signed)
Please  continue: - Metformin 1000 mg 2x a day - Glipizide 2.5 mg 2x a day before meals, except you may need 5 mg before a large meal  Please return in 4 months with your sugar log.

## 2018-05-04 NOTE — Progress Notes (Signed)
Patient ID: Charles [name], male   DOB: [birth year], 76 y.o.   MRN: [id number]  HPI: Charles [name] is a 77 y.o.-year-old male, returning for f/u for DM2 dx 2008, non-insulin-dependent, uncontrolled, with complications (CAD - s/p NSTEMI, PN, mild CKD). Last visit 4.5 months ago.  He started to change his diet in 11/2017: Reduced sweets, reduce fatty foods, not eating before meals.  His sugars improved afterwards, so at last visit HbA1c was 7.0%.  He is on steroid eye drops, started 04/10/2018. Sugars are higher  - started to increase after he started these.  DM2: Last hemoglobin A1c was: Lab Results  Component Value Date   HGBA1C 7.0 12/11/2017   HGBA1C 7.6 09/11/2017   HGBA1C 7.2 06/10/2017  12/2013: 8.6%  Pt is on a regimen of: - Metformin 1000 mg 2x a day with meals - Glipizide 2.5 mg 2x a day before meals (decreased as he had lower CBGs before lunch)  Pt checks his sugars once a day: - am:  96-177 >> 100-156, 171 (late dinner, pizza) >> 113-170, 195 - 2h after b'fast:78-116 >> n/c >> 68x1 >> n/c - before lunch: 79-90, 157 >> n/c >> 69, 79 >> n/c but felt low at the [facility] one day  - 2h after lunch: 123-176 >> n/c - before dinner: 57, 106 >> n/c >> 160s >> n/c - 2h after dinner: 173-197 >> n/c >> 240 >> n/c - bedtime:125-135 >> 110-150 >> 160s >> n/c Lowest sugar was  96 >> 69 >> 113; he has hypoglycemia awareness in the 80s Highest sugar was 240 >> 171 >> 199.  -+ Mild CKD, last BUN/creatinine:  Lab Results  Component Value Date   BUN 16 08/13/2017   CREATININE 1.06 08/13/2017  Of note, he has a history of nephrectomy. On lisinopril 10  -+ HL; last set of lipids: Lab Results  Component Value Date   CHOL 154 08/13/2017   HDL 50.70 08/13/2017   LDLCALC 73 08/13/2017   LDLDIRECT 139.8 07/08/2014   TRIG 155.0 (H) 08/13/2017   CHOLHDL 3 08/13/2017  On Lipitor 40. - last eye exam was in 11/2017: No DR; now seeing Dr. [place] - retinal vein occlusion, + cataracts, + macular  "hole". He had eye sx's since last visit - last Sx this mo. -Improved numbness and tingling in his feet   B12 deficiency:  He had a very low vitamin B12 in 2017: Component     Latest Ref Rng 10/13/2015  Vitamin B12     211 - 911 pg/mL 94 (L)   He was on B12 injections, but currently on p.o. 5000 mcg daily. Lab Results  Component Value Date   GEXBMWUX32 4,401 (H) 02/18/2017   VITAMINB12 234 10/22/2016   VITAMINB12 217 04/19/2016   VITAMINB12 94 (L) 10/13/2015   He was admitted with NSTEMI 10/18/2015.  He had stents placed then.  He continues to go to the gym 5 out of 7 days.  ROS: Constitutional: no weight gain/no weight loss, no fatigue, no subjective hyperthermia, no subjective hypothermia Eyes: no blurry vision, no xerophthalmia ENT: no sore throat, no nodules palpated in throat, no dysphagia, no odynophagia, no hoarseness Cardiovascular: no CP/no SOB/no palpitations/no leg swelling Respiratory: no cough/no SOB/no wheezing Gastrointestinal: no N/no V/no D/no C/no acid reflux Musculoskeletal: no muscle aches/no joint aches Skin: no rashes, no hair loss Neurological: no tremors/no numbness/no tingling/no dizziness  I reviewed pt's medications, allergies, PMH, social hx, family hx, and changes were documented in the history of  present illness. Otherwise, unchanged from my initial visit note.  Past Medical History:  Diagnosis Date  . Anemia    "when I was a lad"  . Arthritis    "right femur" (10/19/2015)  . AVM (arteriovenous malformation) of colon    2 - non-bleeding 2013  . Biceps muscle tear    right  . Cancer of kidney ([place]) 2000   left nephrectomy  . Coronary artery disease   . Eczema   . GERD (gastroesophageal reflux disease)   . Hernia, ventral   . Hyperlipidemia   . Hypertension   . Obesity   . Personal history of adenomatous colonic polyps 07/20/2012   3 + adenomas 2009 07/20/2012 2 diminutive polyps    . Pneumonia 1942; 1950s X 1  . Type II diabetes  mellitus ([place])   . Walking pneumonia 2000's X 1   Past Surgical History:  Procedure Laterality Date  . CARDIAC CATHETERIZATION N/A 10/19/2015   Procedure: Left Heart Cath and Coronary Angiography;  Surgeon: Charolette Forward, MD;  Location: [facility];  Service: Cardiovascular;  Laterality: N/A;  . CARDIAC CATHETERIZATION N/A 10/19/2015   Procedure: Coronary Stent Intervention;  Surgeon: Charolette Forward, MD;  Location: [facility];  Service: Cardiovascular;  Laterality: N/A;  . COLONOSCOPY     "nothing showed up this time"  . COLONOSCOPY W/ BIOPSIES AND POLYPECTOMY  X 1  . CORONARY ANGIOPLASTY    . FEMUR IM NAIL Right 07/26/2013   Procedure: INTRAMEDULLARY (IM) NAIL FEMORAL subtrochanteric;  Surgeon: Mauri Pole, MD;  Location: [facility];  Service: Orthopedics;  Laterality: Right;  . FRACTURE SURGERY    . INGUINAL HERNIA REPAIR Left >3 times  . INGUINAL HERNIA REPAIR Right 2012  . NEPHRECTOMY Left 2000  . TONSILLECTOMY  1940s   Social History   Socioeconomic History  . Marital status: Widowed    Spouse name: Not on file  . Number of children: Not on file  . Years of education: Not on file  . Highest education level: Not on file  Occupational History  . Not on file  Social Needs  . Financial resource strain: Not on file  . Food insecurity:    Worry: Not on file    Inability: Not on file  . Transportation needs:    Medical: Not on file    Non-medical: Not on file  Tobacco Use  . Smoking status: Former Smoker    Years: 20.00    Types: Cigarettes, Pipe, Cigars    Last attempt to quit: 09/09/1968    Years since quitting: 49.6  . Smokeless tobacco: Never Used  . Tobacco comment: smoking cigars- quit in 13   Substance and Sexual Activity  . Alcohol use: Yes    Alcohol/week: 1.0 standard drinks    Types: 1 Cans of beer per week    Comment: occasionally   . Drug use: No  . Sexual activity: Never  Lifestyle  . Physical activity:    Days per week: Not on file     Minutes per session: Not on file  . Stress: Not on file  Relationships  . Social connections:    Talks on phone: Not on file    Gets together: Not on file    Attends religious service: Not on file    Active member of club or organization: Not on file    Attends meetings of clubs or organizations: Not on file    Relationship status: Not on file  .  Intimate partner violence:    Fear of current or ex partner: Not on file    Emotionally abused: Not on file    Physically abused: Not on file    Forced sexual activity: Not on file  Other Topics Concern  . 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    Not married- widowed    Three children, One in [place], one in [place] one in [place]   Current Outpatient Medications on File Prior to Visit  Medication Sig Dispense Refill  . aspirin 81 MG chewable tablet Chew 1 tablet (81 mg total) by mouth daily.    Marland Kitchen atorvastatin (LIPITOR) 40 MG tablet Take 1 tablet (40 mg total) by mouth daily at 6 PM. 30 tablet 3  . Bioflavonoid Products (VITAMIN C) CHEW Chew by mouth.    . cyanocobalamin 1000 MCG tablet Take 5,000 mcg by mouth daily.    Marland Kitchen EPINEPHrine (EPIPEN 2-PAK) 0.3 mg/0.3 mL DEVI Inject 0.3 mg into the muscle once.      Marland Kitchen glipiZIDE (GLUCOTROL) 5 MG tablet Take 1 tablet (5 mg total) by mouth 2 (two) times daily before a meal. 180 tablet 3  . lisinopril (PRINIVIL,ZESTRIL) 5 MG tablet Take 1 tablet (5 mg total) by mouth daily. 30 tablet 3  . metFORMIN (GLUCOPHAGE) 1000 MG tablet Take 1 tablet by mouth two  times daily with meals 360 tablet 3  . metoprolol succinate (TOPROL-XL) 50 MG 24 hr tablet     . Multiple Vitamins-Minerals (ICAPS PO) Take 1 tablet by mouth daily.    . nitroGLYCERIN (NITROSTAT) 0.4 MG SL tablet Place 1 tablet (0.4 mg total) under the tongue every 5 (five) minutes as needed for chest pain. 25 tablet 1  . ONETOUCH  DELICA LANCETS FINE MISC Use to check sugar daily 100 each 5  . ONETOUCH VERIO test strip USE 1 STRIP TO CHECK GLUCOSE ONCE DAILY 50 each 11  . vitamin E 400 UNIT capsule Take 400 Units by mouth daily.     No current facility-administered medications on file prior to visit.    No Known Allergies Family History  Problem Relation Age of Onset  . Drug abuse Other   . Cancer Other   . Heart disease Other   . Lung disease Other   . Colon cancer Neg Hx   . Stomach cancer Neg Hx   . Diabetes Mother   . Diabetes Sister     PE: BP (!) 142/80   Pulse 79   Ht 5\' 11"  (1.803 m)   Wt 248 lb 9.6 oz (112.8 kg)   SpO2 97%   BMI 34.67 kg/m  Wt Readings from Last 3 Encounters:  05/04/18 248 lb 9.6 oz (112.8 kg)  12/11/17 247 lb 12.8 oz (112.4 kg)  09/11/17 253 lb 11.2 oz (115.1 kg)   Constitutional: overweight, in NAD Eyes: PERRLA, EOMI, no exophthalmos ENT: moist mucous membranes, no thyromegaly, no cervical lymphadenopathy Cardiovascular: RRR, No MRG, + slight pitting periankle swelling, R>L, chronic Respiratory: CTA B Gastrointestinal: abdomen soft, NT, ND, BS+ Musculoskeletal: no deformities, strength intact in all 4 Skin: moist, warm, no rashes Neurological: no tremor with outstretched hands, DTR normal in all 4  ASSESSMENT: 1. DM2, non-insulin-dependent, uncontrolled, with complications - CAD - s/p NSTEMI (Dr. Terrence Dupont) - peripheral neuropathy - CKD  2. B12 def  3. HL  4. Obesity  class 1 BMI Classification:  < 18.5 underweight   18.5-24.9 normal weight   25.0-29.9 overweight   30.0-34.9 class I obesity   35.0-39.9 class II obesity   ? 40.0 class III obesity   PLAN:  1. Patient with long-standing, uncontrolled, type 2 diabetes, with improvement of his blood sugars at last visit after changing his diet.  He continues to eat lower fat foods and concentrated sweets. - He could not afford SGLT2 inhibitors in the past and we discussed at last visit to check with his  insurance whether they cover a DPP 4 inhibitor.  However, as his HbA1c was at target, at 7.0%, we did not change his regimen then.  I did advise him to occasionally use a 5 mg of glipizide instead of 2.5 mg, for a larger meal. - at this visit, sugars were at goal before he started his eye drops, then increased. However, he is close to finishing the drops >> will not change regimen for now, but I advised him to let me know if sugars do not start decreasing after he stops the drops - I advised him to: Patient Instructions  Please continue: - Metformin 1000 mg 2x a day - Glipizide 2.5 mg 2x a day before meals, but 5 mg before a larger meal  Please return in 4  months with your sugar log.  - today, HbA1c is 7% (stable, at goal)  - continue checking sugars at different times of the day - check 1x a day, rotating checks - advised for yearly eye exams >> he is UTD - Return to clinic in 4 mo with sugar log     2. B12 def. -As he was on metformin, we checked a B12 level and this was low in 10/2015. -We initially started him on injections, then switched to 5000 mcg B12 daily. -Latest B12 level was slightly elevated.  We continue the same dose of p.o. B12. -We will recheck today  3. HL - Reviewed latest lipid panel from 08/2017: LDL much better, TG slightly high Lab Results  Component Value Date   CHOL 154 08/13/2017   HDL 50.70 08/13/2017   LDLCALC 73 08/13/2017   LDLDIRECT 139.8 07/08/2014   TRIG 155.0 (H) 08/13/2017   CHOLHDL 3 08/13/2017  - Continues the statin without side effects.   Office Visit on 05/04/2018  Component Date Value Ref Range Status  . Vitamin B-12 05/04/2018 >1500* 211 - 911 pg/mL Final  Vitamin B12 level above target.  Will decrease the supplement to 5000 mcg every other day.  Philemon Kingdom, MD PhD [facility]

## 2018-05-04 NOTE — Addendum Note (Signed)
Addended by: Drucilla Schmidt on: 05/04/2018 01:21 PM   Modules accepted: Orders

## 2018-05-26 ENCOUNTER — Telehealth: Payer: Self-pay | Admitting: Adult Health

## 2018-05-26 ENCOUNTER — Ambulatory Visit (INDEPENDENT_AMBULATORY_CARE_PROVIDER_SITE_OTHER): Payer: Medicare Other

## 2018-05-26 ENCOUNTER — Encounter: Payer: Self-pay | Admitting: Adult Health

## 2018-05-26 ENCOUNTER — Ambulatory Visit (INDEPENDENT_AMBULATORY_CARE_PROVIDER_SITE_OTHER): Payer: Medicare Other | Admitting: Adult Health

## 2018-05-26 VITALS — BP 138/84 | Temp 98.1°F | Wt 248.0 lb

## 2018-05-26 DIAGNOSIS — Z23 Encounter for immunization: Secondary | ICD-10-CM

## 2018-05-26 DIAGNOSIS — R1012 Left upper quadrant pain: Secondary | ICD-10-CM

## 2018-05-26 LAB — COMPREHENSIVE METABOLIC PANEL
ALT: 29 U/L (ref 0–53)
AST: 29 U/L (ref 0–37)
Albumin: 4.2 g/dL (ref 3.5–5.2)
Alkaline Phosphatase: 78 U/L (ref 39–117)
BUN: 22 mg/dL (ref 6–23)
CHLORIDE: 99 meq/L (ref 96–112)
CO2: 28 meq/L (ref 19–32)
CREATININE: 1.19 mg/dL (ref 0.40–1.50)
Calcium: 9.8 mg/dL (ref 8.4–10.5)
GFR: 62.98 mL/min (ref >60.00)
GLUCOSE: 177 mg/dL — AB (ref 70–99)
Potassium: 4.9 mEq/L (ref 3.5–5.1)
Sodium: 138 mEq/L (ref 135–145)
Total Bilirubin: 0.7 mg/dL (ref 0.2–1.2)
Total Protein: 6.8 g/dL (ref 6.0–8.3)

## 2018-05-26 LAB — CBC WITH DIFFERENTIAL/PLATELET
BASOS PCT: 1.6 % (ref 0.0–3.0)
Basophils Absolute: 0.1 10*3/uL (ref 0.0–0.1)
EOS ABS: 0.3 10*3/uL (ref 0.0–0.7)
Eosinophils Relative: 5.5 % — ABNORMAL HIGH (ref 0.0–5.0)
HCT: 40.8 % (ref 39.0–52.0)
Hemoglobin: 13.9 g/dL (ref 13.0–17.0)
LYMPHS ABS: 0.9 10*3/uL (ref 0.7–4.0)
Lymphocytes Relative: 14.8 % (ref 12.0–46.0)
MCHC: 34.2 g/dL (ref 30.0–36.0)
MCV: 91.9 fl (ref 78.0–100.0)
Monocytes Absolute: 0.6 10*3/uL (ref 0.1–1.0)
Monocytes Relative: 9.4 % (ref 3.0–12.0)
NEUTROS PCT: 68.7 % (ref 43.0–77.0)
Neutro Abs: 4.3 10*3/uL (ref 1.4–7.7)
Platelets: 239 10*3/uL (ref 150.0–400.0)
RBC: 4.44 Mil/uL (ref 4.22–5.81)
RDW: 13.9 % (ref 11.5–15.5)
WBC: 6.3 10*3/uL (ref 4.0–10.5)

## 2018-05-26 LAB — AMYLASE: Amylase: 109 U/L (ref 27–131)

## 2018-05-26 LAB — LIPASE: Lipase: 66 U/L — ABNORMAL HIGH (ref 11.0–59.0)

## 2018-05-26 NOTE — Progress Notes (Signed)
Subjective:    Patient ID: Charles [name], male    DOB: July 02, 1941, 77 y.o.   MRN: [id number]  HPI 77 year old male who  has a past medical history of Anemia, Arthritis, AVM (arteriovenous malformation) of colon, Biceps muscle tear, Cancer of kidney ([place]) (2000), Coronary artery disease, Eczema, GERD (gastroesophageal reflux disease), Hernia, ventral, Hyperlipidemia, Hypertension, Obesity, Personal history of adenomatous colonic polyps (07/20/2012), Pneumonia (1942; 1950s X 1), Type II diabetes mellitus ([place]), and Walking pneumonia (2000's X 1).  He presents to the office today for the acute issues of left side pain x 9 days. Report that the pain is felt as a " throbbing pain". Only painful when he lays in bed ( on his back and left side). Has been able to go to the gym and do daily activities without pain. Reports first started when he reached for his phone after he dropped it and felt a " sharp pain" when he reached for his phone.   Pain was improving but returned 4 days ago. Stretching helps.   Denies any fevers, bruising, trauma, nausea or vomiting or feeling acutely ill. Does not complain of any GERD or UTI like symptoms.  No chronic alcohol use    Review of Systems See HPI   Past Medical History:  Diagnosis Date  . Anemia    "when I was a lad"  . Arthritis    "right femur" (10/19/2015)  . AVM (arteriovenous malformation) of colon    2 - non-bleeding 2013  . Biceps muscle tear    right  . Cancer of kidney ([place]) 2000   left nephrectomy  . Coronary artery disease   . Eczema   . GERD (gastroesophageal reflux disease)   . Hernia, ventral   . Hyperlipidemia   . Hypertension   . Obesity   . Personal history of adenomatous colonic polyps 07/20/2012   3 + adenomas 2009 07/20/2012 2 diminutive polyps    . Pneumonia 1942; 1950s X 1  . Type II diabetes mellitus ([place])   . Walking pneumonia 2000's X 1    Social History   Socioeconomic History  . Marital status: Widowed   Spouse name: Not on file  . Number of children: Not on file  . Years of education: Not on file  . Highest education level: Not on file  Occupational History  . Not on file  Social Needs  . Financial resource strain: Not on file  . Food insecurity:    Worry: Not on file    Inability: Not on file  . Transportation needs:    Medical: Not on file    Non-medical: Not on file  Tobacco Use  . Smoking status: Former Smoker    Years: 20.00    Types: Cigarettes, Pipe, Cigars    Last attempt to quit: 09/09/1968    Years since quitting: 49.7  . Smokeless tobacco: Never Used  . Tobacco comment: smoking cigars- quit in 13   Substance and Sexual Activity  . Alcohol use: Yes    Alcohol/week: 1.0 standard drinks    Types: 1 Cans of beer per week    Comment: occasionally   . Drug use: No  . Sexual activity: Never  Lifestyle  . Physical activity:    Days per week: Not on file    Minutes per session: Not on file  . Stress: Not on file  Relationships  . Social connections:    Talks on phone: Not on file  Gets together: Not on file    Attends religious service: Not on file    Active member of club or organization: Not on file    Attends meetings of clubs or organizations: Not on file    Relationship status: Not on file  . Intimate partner violence:    Fear of current or ex partner: Not on file    Emotionally abused: Not on file    Physically abused: Not on file    Forced sexual activity: Not on file  Other Topics Concern  . 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    Not married- widowed    Three children, One in [place], one in [place] one in [place]    Past Surgical History:  Procedure Laterality Date  . CARDIAC CATHETERIZATION N/A 10/19/2015   Procedure: Left Heart Cath and Coronary Angiography;  Surgeon: Charolette Forward, MD;  Location: [facility];   Service: Cardiovascular;  Laterality: N/A;  . CARDIAC CATHETERIZATION N/A 10/19/2015   Procedure: Coronary Stent Intervention;  Surgeon: Charolette Forward, MD;  Location: [facility];  Service: Cardiovascular;  Laterality: N/A;  . COLONOSCOPY     "nothing showed up this time"  . COLONOSCOPY W/ BIOPSIES AND POLYPECTOMY  X 1  . CORONARY ANGIOPLASTY    . FEMUR IM NAIL Right 07/26/2013   Procedure: INTRAMEDULLARY (IM) NAIL FEMORAL subtrochanteric;  Surgeon: Mauri Pole, MD;  Location: [facility];  Service: Orthopedics;  Laterality: Right;  . FRACTURE SURGERY    . INGUINAL HERNIA REPAIR Left >3 times  . INGUINAL HERNIA REPAIR Right 2012  . NEPHRECTOMY Left 2000  . TONSILLECTOMY  1940s    Family History  Problem Relation Age of Onset  . Drug abuse Other   . Cancer Other   . Heart disease Other   . Lung disease Other   . Colon cancer Neg Hx   . Stomach cancer Neg Hx   . Diabetes Mother   . Diabetes Sister     No Known Allergies  Current Outpatient Medications on File Prior to Visit  Medication Sig Dispense Refill  . aspirin 81 MG chewable tablet Chew 1 tablet (81 mg total) by mouth daily.    Marland Kitchen atorvastatin (LIPITOR) 40 MG tablet Take 1 tablet (40 mg total) by mouth daily at 6 PM. 30 tablet 3  . Bioflavonoid Products (VITAMIN C) CHEW Chew by mouth.    . cyanocobalamin 1000 MCG tablet Take 5,000 mcg by mouth daily.    Marland Kitchen EPINEPHrine (EPIPEN 2-PAK) 0.3 mg/0.3 mL DEVI Inject 0.3 mg into the muscle once.      Marland Kitchen glipiZIDE (GLUCOTROL) 5 MG tablet Take 1 tablet (5 mg total) by mouth 2 (two) times daily before a meal. 180 tablet 3  . lisinopril (PRINIVIL,ZESTRIL) 5 MG tablet Take 1 tablet (5 mg total) by mouth daily. 30 tablet 3  . metFORMIN (GLUCOPHAGE) 1000 MG tablet Take 1 tablet by mouth two  times daily with meals 360 tablet 3  . metoprolol succinate (TOPROL-XL) 50 MG 24 hr tablet     . Multiple Vitamins-Minerals (ICAPS PO) Take 1 tablet by mouth daily.    . nitroGLYCERIN (NITROSTAT) 0.4  MG SL tablet Place 1 tablet (0.4 mg total) under the tongue every 5 (five) minutes as needed for chest pain. 25 tablet 1  . [place]  Use to check sugar daily 100 each 5  . ONETOUCH VERIO test strip USE 1 STRIP TO CHECK GLUCOSE ONCE DAILY 50 each 11  . vitamin E 400 UNIT capsule Take 400 Units by mouth daily.     No current facility-administered medications on file prior to visit.     BP 138/84   Temp 98.1 F (36.7 C)   Wt 248 lb (112.5 kg)   BMI 34.59 kg/m       Objective:   Physical Exam  Constitutional: He is oriented to person, place, and time. He appears well-developed and well-nourished. No distress.  Cardiovascular: Normal rate, regular rhythm, normal heart sounds and intact distal pulses. Exam reveals no gallop and no friction rub.  No murmur heard. Pulmonary/Chest: Effort normal and breath sounds normal. No stridor. No respiratory distress. He has no wheezes. He has no rales.  Abdominal: Soft. Bowel sounds are normal. He exhibits no distension and no mass. There is tenderness. There is no guarding.  Pain with palpation to LUQ around rib 9 & 10   Musculoskeletal: Normal range of motion.  Neurological: He is alert and oriented to person, place, and time.  Skin: Skin is warm and dry. He is not diaphoretic.  Psychiatric: He has a normal mood and affect. His behavior is normal. Judgment and thought content normal.  Nursing note and vitals reviewed.     Assessment & Plan:  1. Left upper quadrant pain - Will check labs for pancreatitis, although not on top of differentials. Possible muscle strain or constipation? Pain from kidney stone Consider CT in thefurutr.  - DG Abd 1 View; Future - DG Chest 2 View; Future - CBC with Differential/Platelet - CMP - Lipase - Amylase  2. Need for prophylactic vaccination and inoculation against influenza  - Flu vaccine HIGH DOSE PF (Fluzone High dose)  [name], NP

## 2018-05-26 NOTE — Telephone Encounter (Signed)
Left voice mail on patients cell phone with results of Xray and recommendations

## 2018-06-08 NOTE — Progress Notes (Signed)
Subjective:   Charles [name] is a 77 y.o. male who presents for Medicare Annual/Subsequent preventive examination.  Reports health as good  Type 2 DM Presented today and still c/o of left sided pain OV x 2 weeks ago; no issues until he lays down at hs Note to cory N and will make an apt to fup    Diet  bMI 36 Chol/ hdl ratio 3 A1c 7.0 04/2018 Added prune juice Tries to watch portions  Tries to cut back on carbs    Diet;  Discussed diet and cut back on sugar  Discussed eating more beans Eats special K at times   BMI 34   Exercise Y 5 days a week M-F Still going to the gym Does 90% walking 2 miles and riding the bike Some weights - slowed down  When he walks ; left hip feels better   Hearing Screening Comments: Children tell him he does Hearing screen  Vision Screening Comments: Vision exam  Feb 2018 Dr. Delman Cheadle  call to Dr. Delman Cheadle office and closed Will fup Monday to request eye report    Tobacco use; Cigars discussed screening last year  Men Ages 39 to 41 Years who Have Ever Smoked  The USPSTF recommends one-time screening for abdominal aortic aneurysm (AAA) with ultrasonography in men ages 87 to 58 years who have ever smoked. ( has had abd CT) Less than 20 pack hx;  Smoked cigars   B12 was low a couple of years ago. Greater the 1500 so now cuts back to q other day  Health Maintenance Due  Topic Date Due  . COLONOSCOPY  07/20/2017   Colonoscopy 07/20/2012 and due 07/20/2017 Did not schedule due to the eyes;      Has had flu vaccines  Educated regarding shingrix     Objective:    Vitals: BP 126/76   Pulse 75   Ht 6' 1"  (1.854 m)   Wt 247 lb (112 kg)   SpO2 98%   BMI 32.59 kg/m   Body mass index is 32.59 kg/m.  Advanced Directives 06/09/2018 06/06/2017 11/21/2015 10/18/2015 07/26/2013 07/25/2013  Does Patient Have a Medical Advance Directive? Yes Yes No No Patient has advance directive, copy not in chart Patient has advance directive, copy not  in chart  Type of Advance Directive - - - - [place];Living will Living will;Healthcare Power of Attorney  Does patient want to make changes to medical advance directive? - - - - - No  Copy of Healthcare Power of Attorney in Chart? - - - - Copy requested from family Copy requested from family  Would patient like information on creating a medical advance directive? - - No - patient declined information No - patient declined information - -  Pre-existing out of facility DNR order (yellow form or pink MOST form) - - - - - No    Tobacco Social History   Tobacco Use  Smoking Status Former Smoker  . Years: 20.00  . Types: Cigarettes, Pipe, Cigars  . Last attempt to quit: 09/09/1968  . Years since quitting: 49.7  Smokeless Tobacco Never Used  Tobacco Comment   smoking cigars- quit in 13      Counseling given: Not Answered Comment: smoking cigars- quit in 13    Clinical Intake:     Past Medical History:  Diagnosis Date  . Anemia    "when I was a lad"  . Arthritis    "right femur" (10/19/2015)  . AVM (  arteriovenous malformation) of colon    2 - non-bleeding 2013  . Biceps muscle tear    right  . Cancer of kidney ([place]) 2000   left nephrectomy  . Coronary artery disease   . Eczema   . GERD (gastroesophageal reflux disease)   . Hernia, ventral   . Hyperlipidemia   . Hypertension   . Obesity   . Personal history of adenomatous colonic polyps 07/20/2012   3 + adenomas 2009 07/20/2012 2 diminutive polyps    . Pneumonia 1942; 1950s X 1  . Type II diabetes mellitus ([place])   . Walking pneumonia 2000's X 1   Past Surgical History:  Procedure Laterality Date  . CARDIAC CATHETERIZATION N/A 10/19/2015   Procedure: Left Heart Cath and Coronary Angiography;  Surgeon: Charolette Forward, MD;  Location: [facility];  Service: Cardiovascular;  Laterality: N/A;  . CARDIAC CATHETERIZATION N/A 10/19/2015   Procedure: Coronary Stent Intervention;  Surgeon: Charolette Forward, MD;   Location: [facility];  Service: Cardiovascular;  Laterality: N/A;  . CATARACT EXTRACTION, BILATERAL Bilateral 01/2018   Dr. Kathrin Penner did surgery and he developed MD and is seeing specialist now for MD treatment  . COLONOSCOPY     "nothing showed up this time"  . COLONOSCOPY W/ BIOPSIES AND POLYPECTOMY  X 1  . CORONARY ANGIOPLASTY    . FEMUR IM NAIL Right 07/26/2013   Procedure: INTRAMEDULLARY (IM) NAIL FEMORAL subtrochanteric;  Surgeon: Mauri Pole, MD;  Location: [facility];  Service: Orthopedics;  Laterality: Right;  . FRACTURE SURGERY    . INGUINAL HERNIA REPAIR Left >3 times  . INGUINAL HERNIA REPAIR Right 2012  . NEPHRECTOMY Left 2000  . TONSILLECTOMY  1940s   Family History  Problem Relation Age of Onset  . Drug abuse Other   . Cancer Other   . Heart disease Other   . Lung disease Other   . Diabetes Mother   . Diabetes Sister   . Colon cancer Neg Hx   . Stomach cancer Neg Hx    Social History   Socioeconomic History  . Marital status: Widowed    Spouse name: Not on file  . Number of children: Not on file  . Years of education: Not on file  . Highest education level: Not on file  Occupational History  . Not on file  Social Needs  . Financial resource strain: Not on file  . Food insecurity:    Worry: Not on file    Inability: Not on file  . Transportation needs:    Medical: Not on file    Non-medical: Not on file  Tobacco Use  . Smoking status: Former Smoker    Years: 20.00    Types: Cigarettes, Pipe, Cigars    Last attempt to quit: 09/09/1968    Years since quitting: 49.7  . Smokeless tobacco: Never Used  . Tobacco comment: smoking cigars- quit in 13   Substance and Sexual Activity  . Alcohol use: Yes    Alcohol/week: 1.0 standard drinks    Types: 1 Cans of beer per week    Comment: occasionally   . Drug use: No  . Sexual activity: Never  Lifestyle  . Physical activity:    Days per week: Not on file    Minutes per session: Not on file  .  Stress: Not on file  Relationships  . Social connections:    Talks on phone: Not on file    Gets together: Not on file  Attends religious service: Not on file    Active member of club or organization: Not on file    Attends meetings of clubs or organizations: Not on file    Relationship status: Not on file  Other Topics Concern  . 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    Not married- widowed    Three children, One in [place], one in [place] one in [place]    Outpatient Encounter Medications as of 06/09/2018  Medication Sig  . aspirin 81 MG chewable tablet Chew 1 tablet (81 mg total) by mouth daily.  Marland Kitchen atorvastatin (LIPITOR) 40 MG tablet Take 1 tablet (40 mg total) by mouth daily at 6 PM.  . Bioflavonoid Products (VITAMIN C) CHEW Chew by mouth.  . cyanocobalamin 1000 MCG tablet Take 5,000 mcg by mouth daily.  Marland Kitchen EPINEPHrine (EPIPEN 2-PAK) 0.3 mg/0.3 mL DEVI Inject 0.3 mg into the muscle once.    Marland Kitchen glipiZIDE (GLUCOTROL) 5 MG tablet Take 1 tablet (5 mg total) by mouth 2 (two) times daily before a meal.  . lisinopril (PRINIVIL,ZESTRIL) 5 MG tablet Take 1 tablet (5 mg total) by mouth daily.  . metFORMIN (GLUCOPHAGE) 1000 MG tablet Take 1 tablet by mouth two  times daily with meals  . metoprolol succinate (TOPROL-XL) 50 MG 24 hr tablet   . nitroGLYCERIN (NITROSTAT) 0.4 MG SL tablet Place 1 tablet (0.4 mg total) under the tongue every 5 (five) minutes as needed for chest pain.  Glory Rosebush DELICA LANCETS FINE MISC Use to check sugar daily  . ONETOUCH VERIO test strip USE 1 STRIP TO CHECK GLUCOSE ONCE DAILY  . vitamin E 400 UNIT capsule Take 400 Units by mouth daily.  . Multiple Vitamins-Minerals (ICAPS PO) Take 1 tablet by mouth daily.   No facility-administered encounter medications on file as of 06/09/2018.     Activities of Daily Living In your present state of  health, do you have any difficulty performing the following activities: 06/09/2018  Hearing? Y  Vision? N  Difficulty concentrating or making decisions? N  Walking or climbing stairs? N  Dressing or bathing? N  Doing errands, shopping? N  Preparing Food and eating ? N  Using the Toilet? N  In the past six months, have you accidently leaked urine? N  Comment getting up 3 to 4 times at hs  Do you have problems with loss of bowel control? N  Managing your Medications? N  Managing your Finances? N  Housekeeping or managing your Housekeeping? N  Some recent data might be hidden    Patient Care Team: [name], NP as PCP - General (Family Medicine)   Assessment:   This is a routine wellness examination for Pharrell.  Exercise Activities and Dietary recommendations    Goals    . Exercise 150 minutes per week (moderate activity)     Start Tai chi     . patient     Will attempt portion control and reduce the calories taken in  Sometimes you can ask for a box to go and separate food out prior to eating     . Patient Stated     By 2020 would like to lose 15 to 20 lbs Discussed tracking diet or joining weight watchers  Watch the saturated fat on labels which will aggravate weight loss attempts   Track with myfitnesspal  Getting rid of chips and pretzels snack - popcorn is better option       Fall Risk Fall Risk  06/09/2018 08/13/2017 06/06/2017 11/21/2015 11/21/2015  Falls in the past year? No No No No No  Comment - - trying to reach in closet and stood on magazines - -  Injury with Fall? - - No - -  Risk for fall due to : - - - - Other (Comment)  Risk for fall due to: Comment - - - - Increased caution since broken leg   Depression Screen PHQ 2/9 Scores 06/09/2018 08/13/2017 06/06/2017 11/27/2015  PHQ - 2 Score 0 0 0 0    Cognitive Function MMSE - Mini Mental State Exam 06/06/2017  Not completed: (No Data)     Ad8 score reviewed for issues:  Issues making  decisions:  Less interest in hobbies / activities:  Repeats questions, stories (family complaining):  Trouble using ordinary gadgets (microwave, computer, phone):  Forgets the month or year:   Mismanaging finances:   Remembering appts:  Daily problems with thinking and/or memory: Ad8 score is=0        Immunization History  Administered Date(s) Administered  . Influenza Split 07/18/2011, 07/06/2012, 07/07/2012  . Influenza Whole 06/09/2006, 06/17/2008, 07/10/2009, 05/18/2010  . Influenza, High Dose Seasonal PF 06/11/2016, 06/06/2017, 05/26/2018  . Influenza,inj,Quad PF,6+ Mos 06/14/2013, 06/02/2014, 07/14/2015  . Pneumococcal Conjugate-13 07/14/2014  . Pneumococcal Polysaccharide-23 06/09/2006  . Td 09/09/2001  . Tdap 02/20/2012      Screening Tests Health Maintenance  Topic Date Due  . COLONOSCOPY  07/20/2017  . FOOT EXAM  08/13/2018  . HEMOGLOBIN A1C  11/04/2018  . OPHTHALMOLOGY EXAM  11/21/2018  . TETANUS/TDAP  02/19/2022  . INFLUENZA VACCINE  Completed  . PNA vac Low Risk Adult  Completed         Plan:      PCP Notes   Health Maintenance Has postponed colonoscopy due to recent eye issues this year post cataract  Educated regarding shingles   Abnormal Screens   A1c 7;  BMI 36 and wants to lose weight   Referrals  none  Patient concerns; Presented today and still c/o of left sided pain but states it is some better OV x 2 weeks ago; no issues until he lays down at hs Discussed another MD apt but states he will start stretching again and see if the pain continues to get better and if it does not, then you will call and make apt.  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 Clovia Cuff is treating -  retinal specialist is treating  Now getting a shot to OD  OS has some macular but not as bad as left   Also states he gets up to go void 3 to 4 times a night   States he does sleep well at hs; 6 hours   Nurse Concerns; As noted   Next PCP apt Just seen Tommi Rumps Will schedule if needed       I have personally reviewed and noted the following in the patient's chart:   . Medical and social history . Use of alcohol, tobacco or illicit drugs  . Current medications and supplements . Functional ability  and status . Nutritional status . Physical activity . Advanced directives . List of other physicians . Hospitalizations, surgeries, and ER visits in previous 12 months . Vitals . Screenings to include cognitive, depression, and falls . Referrals and appointments  In addition, I have reviewed and discussed with patient certain preventive protocols, quality metrics, and best practice recommendations. A written personalized care plan for preventive services as well as general preventive health recommendations were provided to patient.     QJJHE,RDEYC, RN  06/09/2018

## 2018-06-09 ENCOUNTER — Ambulatory Visit (INDEPENDENT_AMBULATORY_CARE_PROVIDER_SITE_OTHER): Payer: Medicare Other

## 2018-06-09 VITALS — BP 126/76 | HR 75 | Ht 73.0 in | Wt 247.0 lb

## 2018-06-09 DIAGNOSIS — Z0000 Encounter for general adult medical examination without abnormal findings: Secondary | ICD-10-CM | POA: Diagnosis not present

## 2018-06-09 NOTE — Progress Notes (Signed)
I have reviewed documentation for AWV and Advance Care Planning provided by the health coach and agree with documentation. I was immediately available for questions.  

## 2018-06-09 NOTE — Patient Instructions (Addendum)
Charles [name] , Thank you for taking time to come for your Medicare Wellness Visit. I appreciate your ongoing commitment to your health goals. Please review the following plan we discussed and let me know if I can assist you in the future.   May call Dr. Carlean Purl to update on the plan and will back in touch when he feels he can do this   Anyone can have hearing screen; can go to [place] - can assist with hearing aid x 1  No reviews  [facility]  [id number]  [phone] - [place]   [email or web address]-[email or web address]_to_Buy_Hearing_[email or web address]   Shingrix is a vaccine for the prevention of Shingles in Adults 50 and older.  If you are on Medicare, the shingrix is covered under your Part D plan, so you will take both of the vaccines in the series at your pharmacy. Please check with your benefits regarding applicable copays or out of pocket expenses.  The Shingrix is given in 2 vaccines approx 8 weeks apart. You must receive the 2nd dose prior to 6 months from receipt of the first. Please have the pharmacist print out you Immunization  dates for our office records    These are the goals we discussed: Goals    . Exercise 150 minutes per week (moderate activity)     Start Tai chi     . patient     Will attempt portion control and reduce the calories taken in  Sometimes you can ask for a box to go and separate food out prior to eating     . Patient Stated     By 2020 would like to lose 15 to 20 lbs Discussed tracking diet or joining weight watchers  Watch the saturated fat on labels which will aggravate weight loss attempts   Track with myfitnesspal  Getting rid of chips and pretzels snack - popcorn is better option       This is a list of the screening recommended for you and due dates:  Health Maintenance  Topic Date Due  . Colon Cancer Screening  07/20/2017  .  Complete foot exam   08/13/2018  . Hemoglobin A1C  11/04/2018  . Eye exam for diabetics  11/21/2018  . Tetanus Vaccine  02/19/2022  . Flu Shot  Completed  . Pneumonia vaccines  Completed      Fall Prevention in the Home Falls can cause injuries. They can happen to people of all ages. There are many things you can do to make your home safe and to help prevent falls. What can I do on the outside of my home?  Regularly fix the edges of walkways and driveways and fix any cracks.  Remove anything that might make you trip as you walk through a door, such as a raised step or threshold.  Trim any bushes or trees on the path to your home.  Use bright outdoor lighting.  Clear any walking paths of anything that might make someone trip, such as rocks or tools.  Regularly check to see if handrails are loose or broken. Make sure that both sides of any steps have handrails.  Any raised decks and porches should have guardrails on the edges.  Have any leaves, snow, or ice cleared regularly.  Use sand or salt on walking paths during winter.  Clean up any spills in your garage right away. This includes oil or grease spills. What can  I do in the bathroom?  Use night lights.  Install grab bars by the toilet and in the tub and shower. Do not use towel bars as grab bars.  Use non-skid mats or decals in the tub or shower.  If you need to sit down in the shower, use a plastic, non-slip stool.  Keep the floor dry. Clean up any water that spills on the floor as soon as it happens.  Remove soap buildup in the tub or shower regularly.  Attach bath mats securely with double-sided non-slip rug tape.  Do not have throw rugs and other things on the floor that can make you trip. What can I do in the bedroom?  Use night lights.  Make sure that you have a light by your bed that is easy to reach.  Do not use any sheets or blankets that are too big for your bed. They should not hang down onto the  floor.  Have a firm chair that has side arms. You can use this for support while you get dressed.  Do not have throw rugs and other things on the floor that can make you trip. What can I do in the kitchen?  Clean up any spills right away.  Avoid walking on wet floors.  Keep items that you use a lot in easy-to-reach places.  If you need to reach something above you, use a strong step stool that has a grab bar.  Keep electrical cords out of the way.  Do not use floor polish or wax that makes floors slippery. If you must use wax, use non-skid floor wax.  Do not have throw rugs and other things on the floor that can make you trip. What can I do with my stairs?  Do not leave any items on the stairs.  Make sure that there are handrails on both sides of the stairs and use them. Fix handrails that are broken or loose. Make sure that handrails are as long as the stairways.  Check any carpeting to make sure that it is firmly attached to the stairs. Fix any carpet that is loose or worn.  Avoid having throw rugs at the top or bottom of the stairs. If you do have throw rugs, attach them to the floor with carpet tape.  Make sure that you have a light switch at the top of the stairs and the bottom of the stairs. If you do not have them, ask someone to add them for you. What else can I do to help prevent falls?  Wear shoes that: ? Do not have high heels. ? Have rubber bottoms. ? Are comfortable and fit you well. ? Are closed at the toe. Do not wear sandals.  If you use a stepladder: ? Make sure that it is fully opened. Do not climb a closed stepladder. ? Make sure that both sides of the stepladder are locked into place. ? Ask someone to hold it for you, if possible.  Clearly mark and make sure that you can see: ? Any grab bars or handrails. ? First and last steps. ? Where the edge of each step is.  Use tools that help you move around (mobility aids) if they are needed. These  include: ? Canes. ? Walkers. ? Scooters. ? Crutches.  Turn on the lights when you go into a dark area. Replace any light bulbs as soon as they burn out.  Set up your furniture so you have a clear path. Avoid moving  your furniture around.  If any of your floors are uneven, fix them.  If there are any pets around you, be aware of where they are.  Review your medicines with your doctor. Some medicines can make you feel dizzy. This can increase your chance of falling. Ask your doctor what other things that you can do to help prevent falls. This information is not intended to replace advice given to you by your health care provider. Make sure you discuss any questions you have with your health care provider. Document Released: 06/22/2009 Document Revised: 02/01/2016 Document Reviewed: 09/30/2014 Elsevier Interactive Patient Education  2018 [place] Maintenance, Male A healthy lifestyle and preventive care is important for your health and wellness. Ask your health care provider about what schedule of regular examinations is right for you. What should I know about weight and diet? Eat a Healthy Diet  Eat plenty of vegetables, fruits, whole grains, low-fat dairy products, and lean protein.  Do not eat a lot of foods high in solid fats, added sugars, or salt.  Maintain a Healthy Weight Regular exercise can help you achieve or maintain a healthy weight. You should:  Do at least 150 minutes of exercise each week. The exercise should increase your heart rate and make you sweat (moderate-intensity exercise).  Do strength-training exercises at least twice a week.  Watch Your Levels of Cholesterol and Blood Lipids  Have your blood tested for lipids and cholesterol every 5 years starting at 77 years of age. If you are at high risk for heart disease, you should start having your blood tested when you are 77 years old. You may need to have your cholesterol levels checked more  often if: ? Your lipid or cholesterol levels are high. ? You are older than 77 years of age. ? You are at high risk for heart disease.  What should I know about cancer screening? Many types of cancers can be detected early and may often be prevented. Lung Cancer  You should be screened every year for lung cancer if: ? You are a current smoker who has smoked for at least 30 years. ? You are a former smoker who has quit within the past 15 years.  Talk to your health care provider about your screening options, when you should start screening, and how often you should be screened.  Colorectal Cancer  Routine colorectal cancer screening usually begins at 77 years of age and should be repeated every 5-10 years until you are 77 years old. You may need to be screened more often if early forms of precancerous polyps or small growths are found. Your health care provider may recommend screening at an earlier age if you have risk factors for colon cancer.  Your health care provider may recommend using home test kits to check for hidden blood in the stool.  A small camera at the end of a tube can be used to examine your colon (sigmoidoscopy or colonoscopy). This checks for the earliest forms of colorectal cancer.  Prostate and Testicular Cancer  Depending on your age and overall health, your health care provider may do certain tests to screen for prostate and testicular cancer.  Talk to your health care provider about any symptoms or concerns you have about testicular or prostate cancer.  Skin Cancer  Check your skin from head to toe regularly.  Tell your health care provider about any new moles or changes in moles, especially if: ? There is a change  in a mole's size, shape, or color. ? You have a mole that is larger than a pencil eraser.  Always use sunscreen. Apply sunscreen liberally and repeat throughout the day.  Protect yourself by wearing long sleeves, pants, a wide-brimmed hat, and  sunglasses when outside.  What should I know about heart disease, diabetes, and high blood pressure?  If you are 67-69 years of age, have your blood pressure checked every 3-5 years. If you are 1 years of age or older, have your blood pressure checked every year. You should have your blood pressure measured twice-once when you are at a hospital or clinic, and once when you are not at a hospital or clinic. Record the average of the two measurements. To check your blood pressure when you are not at a hospital or clinic, you can use: ? An automated blood pressure machine at a pharmacy. ? A home blood pressure monitor.  Talk to your health care provider about your target blood pressure.  If you are between 59-82 years old, ask your health care provider if you should take aspirin to prevent heart disease.  Have regular diabetes screenings by checking your fasting blood sugar level. ? If you are at a normal weight and have a low risk for diabetes, have this test once every three years after the age of 72. ? If you are overweight and have a high risk for diabetes, consider being tested at a younger age or more often.  A one-time screening for abdominal aortic aneurysm (AAA) by ultrasound is recommended for men aged 75-75 years who are current or former smokers. What should I know about preventing infection? Hepatitis B If you have a higher risk for hepatitis B, you should be screened for this virus. Talk with your health care provider to find out if you are at risk for hepatitis B infection. Hepatitis C Blood testing is recommended for:  Everyone born from [birth year] through 1965.  Anyone with known risk factors for hepatitis C.  Sexually Transmitted Diseases (STDs)  You should be screened each year for STDs including gonorrhea and chlamydia if: ? You are sexually active and are younger than 77 years of age. ? You are older than 77 years of age and your health care provider tells you that you are  at risk for this type of infection. ? Your sexual activity has changed since you were last screened and you are at an increased risk for chlamydia or gonorrhea. Ask your health care provider if you are at risk.  Talk with your health care provider about whether you are at high risk of being infected with HIV. Your health care provider may recommend a prescription medicine to help prevent HIV infection.  What else can I do?  Schedule regular health, dental, and eye exams.  Stay current with your vaccines (immunizations).  Do not use any tobacco products, such as cigarettes, chewing tobacco, and e-cigarettes. If you need help quitting, ask your health care provider.  Limit alcohol intake to no more than 2 drinks per day. One drink equals 12 ounces of beer, 5 ounces of wine, or 1 ounces of hard liquor.  Do not use street drugs.  Do not share needles.  Ask your health care provider for help if you need support or information about quitting drugs.  Tell your health care provider if you often feel depressed.  Tell your health care provider if you have ever been abused or do not feel safe at home. This  information is not intended to replace advice given to you by your health care provider. Make sure you discuss any questions you have with your health care provider. Document Released: 02/22/2008 Document Revised: 04/24/2016 Document Reviewed: 05/30/2015 Elsevier Interactive Patient Education  [employer].

## 2018-08-19 ENCOUNTER — Ambulatory Visit (INDEPENDENT_AMBULATORY_CARE_PROVIDER_SITE_OTHER): Payer: Medicare Other | Admitting: Adult Health

## 2018-08-19 ENCOUNTER — Encounter: Payer: Self-pay | Admitting: Adult Health

## 2018-08-19 VITALS — BP 138/88 | Temp 98.0°F | Ht 70.5 in | Wt 253.0 lb

## 2018-08-19 DIAGNOSIS — Z125 Encounter for screening for malignant neoplasm of prostate: Secondary | ICD-10-CM | POA: Diagnosis not present

## 2018-08-19 DIAGNOSIS — Z0000 Encounter for general adult medical examination without abnormal findings: Secondary | ICD-10-CM | POA: Diagnosis not present

## 2018-08-19 DIAGNOSIS — E1165 Type 2 diabetes mellitus with hyperglycemia: Secondary | ICD-10-CM | POA: Diagnosis not present

## 2018-08-19 DIAGNOSIS — E785 Hyperlipidemia, unspecified: Secondary | ICD-10-CM | POA: Diagnosis not present

## 2018-08-19 DIAGNOSIS — E1159 Type 2 diabetes mellitus with other circulatory complications: Secondary | ICD-10-CM | POA: Diagnosis not present

## 2018-08-19 DIAGNOSIS — E538 Deficiency of other specified B group vitamins: Secondary | ICD-10-CM

## 2018-08-19 DIAGNOSIS — I10 Essential (primary) hypertension: Secondary | ICD-10-CM

## 2018-08-19 LAB — BASIC METABOLIC PANEL
BUN: 21 mg/dL (ref 6–23)
CO2: 31 mEq/L (ref 19–32)
Calcium: 9.3 mg/dL (ref 8.4–10.5)
Chloride: 104 mEq/L (ref 96–112)
Creatinine, Ser: 1.1 mg/dL (ref 0.40–1.50)
GFR: 68.92 mL/min (ref >60.00)
GLUCOSE: 97 mg/dL (ref 70–99)
Potassium: 4.8 mEq/L (ref 3.5–5.1)
Sodium: 141 mEq/L (ref 135–145)

## 2018-08-19 LAB — HEPATIC FUNCTION PANEL
ALT: 25 U/L (ref 0–53)
AST: 27 U/L (ref 0–37)
Albumin: 4.3 g/dL (ref 3.5–5.2)
Alkaline Phosphatase: 73 U/L (ref 39–117)
Bilirubin, Direct: 0.1 mg/dL (ref 0.0–0.3)
Total Bilirubin: 0.5 mg/dL (ref 0.2–1.2)
Total Protein: 6.7 g/dL (ref 6.0–8.3)

## 2018-08-19 LAB — LIPID PANEL
Cholesterol: 136 mg/dL (ref 0–200)
HDL: 45.2 mg/dL (ref >39.00)
LDL Cholesterol: 52 mg/dL (ref 0–99)
NonHDL: 90.9
Total CHOL/HDL Ratio: 3
Triglycerides: 193 mg/dL — ABNORMAL HIGH (ref 0.0–149.0)
VLDL: 38.6 mg/dL (ref 0.0–40.0)

## 2018-08-19 LAB — CBC WITH DIFFERENTIAL/PLATELET
Basophils Absolute: 0.1 10*3/uL (ref 0.0–0.1)
Basophils Relative: 1.2 % (ref 0.0–3.0)
Eosinophils Absolute: 0.3 10*3/uL (ref 0.0–0.7)
Eosinophils Relative: 5.7 % — ABNORMAL HIGH (ref 0.0–5.0)
HCT: 39.2 % (ref 39.0–52.0)
HEMOGLOBIN: 13 g/dL (ref 13.0–17.0)
Lymphocytes Relative: 25.2 % (ref 12.0–46.0)
Lymphs Abs: 1.2 10*3/uL (ref 0.7–4.0)
MCHC: 33 g/dL (ref 30.0–36.0)
MCV: 91.8 fl (ref 78.0–100.0)
MONO ABS: 0.7 10*3/uL (ref 0.1–1.0)
Monocytes Relative: 14.8 % — ABNORMAL HIGH (ref 3.0–12.0)
NEUTROS PCT: 53.1 % (ref 43.0–77.0)
Neutro Abs: 2.6 10*3/uL (ref 1.4–7.7)
Platelets: 235 10*3/uL (ref 150.0–400.0)
RBC: 4.27 Mil/uL (ref 4.22–5.81)
RDW: 13.7 % (ref 11.5–15.5)
WBC: 4.8 10*3/uL (ref 4.0–10.5)

## 2018-08-19 LAB — PSA: PSA: 1.2 ng/mL (ref 0.10–4.00)

## 2018-08-19 LAB — TSH: TSH: 3.47 u[IU]/mL (ref 0.35–4.50)

## 2018-08-19 LAB — VITAMIN B12: Vitamin B-12: 1172 pg/mL — ABNORMAL HIGH (ref 211–911)

## 2018-08-19 NOTE — Progress Notes (Signed)
Subjective:    Patient ID: ALYUS MOFIELD, male    DOB: 03-18-1941, 77 y.o.   MRN: [id number]  HPI Patient presents for yearly preventative medicine examination. He is a pleasant 77 year old male who  has a past medical history of Anemia, Arthritis, AVM (arteriovenous malformation) of colon, Biceps muscle tear, Cancer of kidney ([place]) (2000), Coronary artery disease, Eczema, GERD (gastroesophageal reflux disease), Hernia, ventral, Hyperlipidemia, Hypertension, Obesity, Personal history of adenomatous colonic polyps (07/20/2012), Pneumonia (1942; 1950s X 1), Type II diabetes mellitus ([place]), and Walking pneumonia (2000's X 1).   Hyperlipidemia - takes lipitor and ASA. He is followed by Dr. Terrence Dupont with Cardiology  Lab Results  Component Value Date   CHOL 154 08/13/2017   HDL 50.70 08/13/2017   LDLCALC 73 08/13/2017   LDLDIRECT 139.8 07/08/2014   TRIG 155.0 (H) 08/13/2017   CHOLHDL 3 08/13/2017   Essential Hypertension - Controlled with lisinopril and Metoprolol. Denies chest pain, shortness of breath, dizziness, lightheadedness, or headaches.  BP Readings from Last 3 Encounters:  08/19/18 138/88  06/09/18 126/76  05/26/18 138/84   DM - is followed by Endocrinology. Currently prescribed glipizide 5 mg twice daily and metformin 1000 mg twice daily Lab Results  Component Value Date   HGBA1C 7.0 (A) 05/04/2018   B12 deficiency - has been taking oral B12 every other day. He would like to check this today.   All immunizations and health maintenance protocols were reviewed with the patient and needed orders were placed. utd  Appropriate screening laboratory values were ordered for the patient including screening of hyperlipidemia, renal function and hepatic function. If indicated by BPH, a PSA was ordered.  Medication reconciliation,  past medical history, social history, problem list and allergies were reviewed in detail with the patient  Goals were established with regard to weight  loss, exercise, and  diet in compliance with medications. He continues to go the [employer] up to 5 times a week.  Wt Readings from Last 3 Encounters:  08/19/18 253 lb (114.8 kg)  06/09/18 247 lb (112 kg)  05/26/18 248 lb (112.5 kg)   End of life planning was discussed.  Review of Systems  Constitutional: Negative.   HENT: Negative.   Eyes: Negative.   Respiratory: Negative.   Cardiovascular: Negative.   Gastrointestinal: Negative.   Endocrine: Negative.   Genitourinary: Negative.   Musculoskeletal: Negative.   Skin: Negative.   Allergic/Immunologic: Negative.   Neurological: Negative.   Hematological: Negative.   Psychiatric/Behavioral: Negative.   All other systems reviewed and are negative.  Past Medical History:  Diagnosis Date  . Anemia    "when I was a lad"  . Arthritis    "right femur" (10/19/2015)  . AVM (arteriovenous malformation) of colon    2 - non-bleeding 2013  . Biceps muscle tear    right  . Cancer of kidney ([place]) 2000   left nephrectomy  . Coronary artery disease   . Eczema   . GERD (gastroesophageal reflux disease)   . Hernia, ventral   . Hyperlipidemia   . Hypertension   . Obesity   . Personal history of adenomatous colonic polyps 07/20/2012   3 + adenomas 2009 07/20/2012 2 diminutive polyps    . Pneumonia 1942; 1950s X 1  . Type II diabetes mellitus ([place])   . Walking pneumonia 2000's X 1    Social History   Socioeconomic History  . Marital status: Widowed    Spouse name: Not on file  .  Number of children: Not on file  . Years of education: Not on file  . Highest education level: Not on file  Occupational History  . Not on file  Social Needs  . Financial resource strain: Not on file  . Food insecurity:    Worry: Not on file    Inability: Not on file  . Transportation needs:    Medical: Not on file    Non-medical: Not on file  Tobacco Use  . Smoking status: Former Smoker    Years: 20.00    Types: Cigarettes, Pipe, Cigars    Last  attempt to quit: 09/09/1968    Years since quitting: 49.9  . Smokeless tobacco: Never Used  . Tobacco comment: smoking cigars- quit in 13   Substance and Sexual Activity  . Alcohol use: Yes    Alcohol/week: 1.0 standard drinks    Types: 1 Cans of beer per week    Comment: occasionally   . Drug use: No  . Sexual activity: Never  Lifestyle  . Physical activity:    Days per week: Not on file    Minutes per session: Not on file  . Stress: Not on file  Relationships  . Social connections:    Talks on phone: Not on file    Gets together: Not on file    Attends religious service: Not on file    Active member of club or organization: Not on file    Attends meetings of clubs or organizations: Not on file    Relationship status: Not on file  . Intimate partner violence:    Fear of current or ex partner: Not on file    Emotionally abused: Not on file    Physically abused: Not on file    Forced sexual activity: Not on file  Other Topics Concern  . 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    Not married- widowed    Three children, One in [place], one in [place] one in [place]    Past Surgical History:  Procedure Laterality Date  . CARDIAC CATHETERIZATION N/A 10/19/2015   Procedure: Left Heart Cath and Coronary Angiography;  Surgeon: Charolette Forward, MD;  Location: [place] [facility];  Service: Cardiovascular;  Laterality: N/A;  . CARDIAC CATHETERIZATION N/A 10/19/2015   Procedure: Coronary Stent Intervention;  Surgeon: Charolette Forward, MD;  Location: [place] [facility];  Service: Cardiovascular;  Laterality: N/A;  . CATARACT EXTRACTION, BILATERAL Bilateral 01/2018   Dr. Kathrin Penner did surgery and he developed MD and is seeing specialist now for MD treatment  . COLONOSCOPY     "nothing showed up this time"  . COLONOSCOPY W/ BIOPSIES AND POLYPECTOMY  X 1  . CORONARY  ANGIOPLASTY    . FEMUR IM NAIL Right 07/26/2013   Procedure: INTRAMEDULLARY (IM) NAIL FEMORAL subtrochanteric;  Surgeon: Mauri Pole, MD;  Location: [place];  Service: Orthopedics;  Laterality: Right;  . FRACTURE SURGERY    . INGUINAL HERNIA REPAIR Left >3 times  . INGUINAL HERNIA REPAIR Right 2012  . NEPHRECTOMY Left 2000  . TONSILLECTOMY  1940s    Family History  Problem Relation Age of Onset  . Drug abuse Other   . Cancer Other   . Heart disease Other   . Lung disease Other   . Diabetes Mother   . Diabetes Sister   . Colon  cancer Neg Hx   . Stomach cancer Neg Hx     No Known Allergies  Current Outpatient Medications on File Prior to Visit  Medication Sig Dispense Refill  . aspirin 81 MG chewable tablet Chew 1 tablet (81 mg total) by mouth daily.    Marland Kitchen atorvastatin (LIPITOR) 40 MG tablet Take 1 tablet (40 mg total) by mouth daily at 6 PM. 30 tablet 3  . Bioflavonoid Products (VITAMIN C) CHEW Chew by mouth.    . cyanocobalamin 1000 MCG tablet Take 5,000 mcg by mouth daily.    Marland Kitchen EPINEPHrine (EPIPEN 2-PAK) 0.3 mg/0.3 mL DEVI Inject 0.3 mg into the muscle once.      Marland Kitchen glipiZIDE (GLUCOTROL) 5 MG tablet Take 1 tablet (5 mg total) by mouth 2 (two) times daily before a meal. 180 tablet 3  . lisinopril (PRINIVIL,ZESTRIL) 5 MG tablet Take 1 tablet (5 mg total) by mouth daily. 30 tablet 3  . metFORMIN (GLUCOPHAGE) 1000 MG tablet Take 1 tablet by mouth two  times daily with meals 360 tablet 3  . metoprolol succinate (TOPROL-XL) 50 MG 24 hr tablet     . Multiple Vitamins-Minerals (ICAPS PO) Take 1 tablet by mouth daily.    . nitroGLYCERIN (NITROSTAT) 0.4 MG SL tablet Place 1 tablet (0.4 mg total) under the tongue every 5 (five) minutes as needed for chest pain. 25 tablet 1  . ONETOUCH DELICA LANCETS FINE MISC Use to check sugar daily 100 each 5  . ONETOUCH VERIO test strip USE 1 STRIP TO CHECK GLUCOSE ONCE DAILY 50 each 11  . vitamin E 400 UNIT capsule Take 400 Units by mouth daily.      No current facility-administered medications on file prior to visit.     BP 138/88   Temp 98 F (36.7 C)   Ht 5' 10.5" (1.791 m) Comment: WITHOUT SHOES  Wt 253 lb (114.8 kg)   BMI 35.79 kg/m       Objective:   Physical Exam  Constitutional: He is oriented to person, place, and time. He appears well-developed and well-nourished. No distress.  HENT:  Head: Normocephalic and atraumatic.  Right Ear: External ear normal.  Left Ear: External ear normal.  Nose: Nose normal.  Mouth/Throat: Oropharynx is clear and moist. No oropharyngeal exudate.  Eyes: Pupils are equal, round, and reactive to light. Conjunctivae and EOM are normal. Right eye exhibits no discharge. Left eye exhibits no discharge. No scleral icterus.  Neck: Normal range of motion. Neck supple. No JVD present. No tracheal deviation present. No thyromegaly present.  Cardiovascular: Normal rate, regular rhythm, normal heart sounds and intact distal pulses. Exam reveals no gallop and no friction rub.  No murmur heard. Pulmonary/Chest: Effort normal and breath sounds normal. No stridor. No respiratory distress. He has no wheezes. He has no rales. He exhibits no tenderness.  Abdominal: Soft. Bowel sounds are normal. He exhibits no distension and no mass. There is no tenderness. There is no rebound and no guarding. A hernia is present. Hernia confirmed positive in the ventral area.  Musculoskeletal: Normal range of motion. He exhibits no edema, tenderness or deformity.  Lymphadenopathy:    He has no cervical adenopathy.  Neurological: He is alert and oriented to person, place, and time. He displays normal reflexes. No cranial nerve deficit or sensory deficit. He exhibits normal muscle tone. Coordination normal.  Skin: Skin is warm and dry. Capillary refill takes less than 2 seconds. No rash noted. He is not diaphoretic. No erythema. No  pallor.  Psychiatric: He has a normal mood and affect. His behavior is normal. Judgment and  thought content normal.  Nursing note and vitals reviewed.     Assessment & Plan:  1. Routine general medical examination at a health care facility - Follow up in one year or sooner if needed - Basic metabolic panel - CBC with Differential/Platelet - Hepatic function panel - Lipid panel - TSH  2. Hyperlipidemia, unspecified hyperlipidemia type - Consider increase in statin  - Encouraged weight loss  - Basic metabolic panel - CBC with Differential/Platelet - Hepatic function panel - Lipid panel - TSH  3. Essential hypertension - near goal. No change in medications  - Basic metabolic panel - CBC with Differential/Platelet - Hepatic function panel - Lipid panel - TSH  4. 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[place]) - Follow up with endocrinology  - Basic metabolic panel - CBC with Differential/Platelet - Hepatic function panel - Lipid panel - TSH  5. Prostate cancer screening  - PSA  6. B12 deficiency  - Vitamin B12  [name], NP

## 2018-08-24 ENCOUNTER — Encounter: Payer: Self-pay | Admitting: Internal Medicine

## 2018-08-24 ENCOUNTER — Ambulatory Visit: Payer: Medicare Other | Admitting: Internal Medicine

## 2018-08-24 VITALS — BP 140/80 | HR 86 | Ht 70.5 in | Wt 252.0 lb

## 2018-08-24 DIAGNOSIS — E538 Deficiency of other specified B group vitamins: Secondary | ICD-10-CM | POA: Diagnosis not present

## 2018-08-24 DIAGNOSIS — E1159 Type 2 diabetes mellitus with other circulatory complications: Secondary | ICD-10-CM

## 2018-08-24 DIAGNOSIS — E785 Hyperlipidemia, unspecified: Secondary | ICD-10-CM | POA: Diagnosis not present

## 2018-08-24 DIAGNOSIS — E1165 Type 2 diabetes mellitus with hyperglycemia: Secondary | ICD-10-CM | POA: Diagnosis not present

## 2018-08-24 LAB — POCT GLYCOSYLATED HEMOGLOBIN (HGB A1C): Hemoglobin A1C: 7.1 % — AB (ref 4.0–5.6)

## 2018-08-24 NOTE — Addendum Note (Signed)
Addended by: Cardell Peach I on: 08/24/2018 09:37 AM   Modules accepted: Orders

## 2018-08-24 NOTE — Patient Instructions (Addendum)
Please continue: - Metformin 1000 mg 2x a day  Please increase: - Glipizide to 5 mg 2x a day before meals  Please check with your insurance if they cover: - Jardiance - Invokana - Farxiga - Steglatro  Please return in 4  months with your sugar log.

## 2018-08-24 NOTE — Progress Notes (Signed)
Patient ID: Charles [name], male   DOB: [birth year], 77 y.o.   MRN: [id number]  HPI: Charles [name] is a 77 y.o.-year-old male, returning for f/u for DM2 dx 2008, non-insulin-dependent, uncontrolled, with complications (CAD - s/p NSTEMI, PN, mild CKD). Last visit 3.5 months ago.  He started to change his diet in 11/2017: Reduced sweets, fatty foods, not eating before meals.  Sugars improved afterwards.  Before last visit, she was on steroids eyedrops and his sugars were higher.  DM2: Last hemoglobin A1c was: Lab Results  Component Value Date   HGBA1C 7.0 (A) 05/04/2018   HGBA1C 7.0 12/11/2017   HGBA1C 7.6 09/11/2017  12/2013: 8.6%  Pt is on a regimen of: - Metformin 1000 mg 2x a day - Glipizide 2.5 mg 2x a day before meals, but 5 mg before a larger meal  Pt checks his sugars once a day: - am:  100-156, 171  >> 113-170, 195 >> 119-188, 259 - 2h after b'fast:78-116 >> n/c >> 68x1 >> n/c >> 201, 206 - before lunch: 79-90, 157 >> n/c >> 69, 79 >> n/c - 2h after lunch: 123-176 >> n/c - before dinner: 57, 106 >> n/c >> 160s >> n/c - 2h after dinner: 173-197 >> n/c >> 240 >> n/c - bedtime:125-135 >> 110-150 >> 160s >> n/c Lowest sugar was  96 >> 69 >> 113 >> 119; he has hypoglycemia awareness in the 80s. Highest sugar was 240 >> 171 >> 199 >> 259.  -+ Mild CKD, last BUN/creatinine:  Lab Results  Component Value Date   BUN 21 08/19/2018   CREATININE 1.10 08/19/2018  Of note, he has a history of nephrectomy. On lisinopril 10.  -+ HL; last set of lipids: Lab Results  Component Value Date   CHOL 136 08/19/2018   HDL 45.20 08/19/2018   LDLCALC 52 08/19/2018   LDLDIRECT 139.8 07/08/2014   TRIG 193.0 (H) 08/19/2018   CHOLHDL 3 08/19/2018  On Lipitor 40. - last eye exam was in 11/2017: No DR; now seeing Dr. [place] - retinal vein occlusion, + cataracts, + macular "hole".  He has a history of eye surgery.- last Sx this mo. -Improved numbness and tingling in his feet   B12  deficiency: He had a very low vitamin B12 in 2017: Component     Latest Ref Rng 10/13/2015  Vitamin B12     211 - 911 pg/mL 94 (L)   He was initially on B12 injections, but we switched to p.o. 5000 mcg daily, now every other day. Lab Results  Component Value Date   VITAMINB12 1,172 (H) 08/19/2018   VITAMINB12 >1500 (H) 05/04/2018   VITAMINB12 1,249 (H) 02/18/2017   VITAMINB12 234 10/22/2016   VITAMINB12 217 04/19/2016   VITAMINB12 94 (L) 10/13/2015   He was admitted with NSTEMI 10/18/2015.  He had stents placed then.  He continues to go to the gym 5 out of 7 days.  ROS: Constitutional: no weight gain/no weight loss, no fatigue, no subjective hyperthermia, no subjective hypothermia Eyes: no blurry vision, no xerophthalmia ENT: no sore throat, no nodules palpated in neck, no dysphagia, no odynophagia, no hoarseness Cardiovascular: no CP/no SOB/no palpitations/no leg swelling Respiratory: no cough/no SOB/no wheezing Gastrointestinal: no N/no V/no D/no C/no acid reflux Musculoskeletal: no muscle aches/no joint aches Skin: no rashes, no hair loss Neurological: no tremors/no numbness/no tingling/no dizziness  I reviewed pt's medications, allergies, PMH, social hx, family hx, and changes were documented in the history of present illness. Otherwise, unchanged  from my initial visit note.  Past Medical History:  Diagnosis Date  . Anemia    "when I was a lad"  . Arthritis    "right femur" (10/19/2015)  . AVM (arteriovenous malformation) of colon    2 - non-bleeding 2013  . Biceps muscle tear    right  . Cancer of kidney ([place]) 2000   left nephrectomy  . Coronary artery disease   . Eczema   . GERD (gastroesophageal reflux disease)   . Hernia, ventral   . Hyperlipidemia   . Hypertension   . Obesity   . Personal history of adenomatous colonic polyps 07/20/2012   3 + adenomas 2009 07/20/2012 2 diminutive polyps    . Pneumonia 1942; 1950s X 1  . Type II diabetes mellitus ([place])   .  Walking pneumonia 2000's X 1   Past Surgical History:  Procedure Laterality Date  . CARDIAC CATHETERIZATION N/A 10/19/2015   Procedure: Left Heart Cath and Coronary Angiography;  Surgeon: Charolette Forward, MD;  Location: [facility];  Service: Cardiovascular;  Laterality: N/A;  . CARDIAC CATHETERIZATION N/A 10/19/2015   Procedure: Coronary Stent Intervention;  Surgeon: Charolette Forward, MD;  Location: [facility];  Service: Cardiovascular;  Laterality: N/A;  . CATARACT EXTRACTION, BILATERAL Bilateral 01/2018   Dr. Kathrin Penner did surgery and he developed MD and is seeing specialist now for MD treatment  . COLONOSCOPY     "nothing showed up this time"  . COLONOSCOPY W/ BIOPSIES AND POLYPECTOMY  X 1  . CORONARY ANGIOPLASTY    . FEMUR IM NAIL Right 07/26/2013   Procedure: INTRAMEDULLARY (IM) NAIL FEMORAL subtrochanteric;  Surgeon: Mauri Pole, MD;  Location: [facility];  Service: Orthopedics;  Laterality: Right;  . FRACTURE SURGERY    . INGUINAL HERNIA REPAIR Left >3 times  . INGUINAL HERNIA REPAIR Right 2012  . NEPHRECTOMY Left 2000  . TONSILLECTOMY  1940s   Social History   Socioeconomic History  . Marital status: Widowed    Spouse name: Not on file  . Number of children: Not on file  . Years of education: Not on file  . Highest education level: Not on file  Occupational History  . Not on file  Social Needs  . Financial resource strain: Not on file  . Food insecurity:    Worry: Not on file    Inability: Not on file  . Transportation needs:    Medical: Not on file    Non-medical: Not on file  Tobacco Use  . Smoking status: Former Smoker    Years: 20.00    Types: Cigarettes, Pipe, Cigars    Last attempt to quit: 09/09/1968    Years since quitting: 49.9  . Smokeless tobacco: Never Used  . Tobacco comment: smoking cigars- quit in 13   Substance and Sexual Activity  . Alcohol use: Yes    Alcohol/week: 1.0 standard drinks    Types: 1 Cans of beer per week    Comment:  occasionally   . Drug use: No  . Sexual activity: Never  Lifestyle  . Physical activity:    Days per week: Not on file    Minutes per session: Not on file  . Stress: Not on file  Relationships  . Social connections:    Talks on phone: Not on file    Gets together: Not on file    Attends religious service: Not on file    Active member of club or organization: Not on file  Attends meetings of clubs or organizations: Not on file    Relationship status: Not on file  . Intimate partner violence:    Fear of current or ex partner: Not on file    Emotionally abused: Not on file    Physically abused: Not on file    Forced sexual activity: Not on file  Other Topics Concern  . 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    Not married- widowed    Three children, One in [place], one in [place] one in [place]   Current Outpatient Medications on File Prior to Visit  Medication Sig Dispense Refill  . aspirin 81 MG chewable tablet Chew 1 tablet (81 mg total) by mouth daily.    Marland Kitchen atorvastatin (LIPITOR) 40 MG tablet Take 1 tablet (40 mg total) by mouth daily at 6 PM. 30 tablet 3  . Bioflavonoid Products (VITAMIN C) CHEW Chew by mouth.    . cyanocobalamin 1000 MCG tablet Take 5,000 mcg by mouth daily.    Marland Kitchen EPINEPHrine (EPIPEN 2-PAK) 0.3 mg/0.3 mL DEVI Inject 0.3 mg into the muscle once.      Marland Kitchen glipiZIDE (GLUCOTROL) 5 MG tablet Take 1 tablet (5 mg total) by mouth 2 (two) times daily before a meal. 180 tablet 3  . lisinopril (PRINIVIL,ZESTRIL) 5 MG tablet Take 1 tablet (5 mg total) by mouth daily. 30 tablet 3  . metFORMIN (GLUCOPHAGE) 1000 MG tablet Take 1 tablet by mouth two  times daily with meals 360 tablet 3  . metoprolol succinate (TOPROL-XL) 50 MG 24 hr tablet     . Multiple Vitamins-Minerals (ICAPS PO) Take 1 tablet by mouth daily.    . nitroGLYCERIN (NITROSTAT) 0.4 MG SL  tablet Place 1 tablet (0.4 mg total) under the tongue every 5 (five) minutes as needed for chest pain. 25 tablet 1  . ONETOUCH DELICA LANCETS FINE MISC Use to check sugar daily 100 each 5  . ONETOUCH VERIO test strip USE 1 STRIP TO CHECK GLUCOSE ONCE DAILY 50 each 11  . vitamin E 400 UNIT capsule Take 400 Units by mouth daily.     No current facility-administered medications on file prior to visit.    No Known Allergies Family History  Problem Relation Age of Onset  . Drug abuse Other   . Cancer Other   . Heart disease Other   . Lung disease Other   . Diabetes Mother   . Diabetes Sister   . Colon cancer Neg Hx   . Stomach cancer Neg Hx     PE: BP 140/80   Pulse 86   Ht 5' 10.5" (1.791 m)   Wt 252 lb (114.3 kg)   SpO2 98%   BMI 35.65 kg/m  Wt Readings from Last 3 Encounters:  08/24/18 252 lb (114.3 kg)  08/19/18 253 lb (114.8 kg)  06/09/18 247 lb (112 kg)   Constitutional: overweight, in NAD Eyes: PERRLA, EOMI, no exophthalmos ENT: moist mucous membranes, no thyromegaly, no cervical lymphadenopathy Cardiovascular: RRR, No MRG, + slight pitting peri-ankle swelling, R>L, chronic Respiratory: CTA B Gastrointestinal: abdomen soft, NT, ND, BS+ Musculoskeletal: no deformities, strength intact in all 4 Skin: moist, warm, no rashes Neurological: no tremor with outstretched hands, DTR normal in all 4   ASSESSMENT: 1. DM2, non-insulin-dependent, uncontrolled, with complications - CAD - s/p NSTEMI (Dr. Terrence Dupont) - peripheral neuropathy -  CKD  2. B12 def  3. HL  PLAN:  1. Patient with longstanding, uncontrolled, type 2 diabetes, with improvement in his sugars after improving his diet.  He continues to eat lower fat foods but less concentrated sweets. -He could not afford SGLT2 inhibitors in the past and we discussed about asking his insurance whether a DPP 4 inhibitor would be covered.  At last visit, his HbA1c was at target, so we continued his metformin and glipizide  regimen.  He is taking a low dose of glipizide before breakfast and dinner but I advised him to double this before a larger meal.  His sugars started to increase before last visit due to eyedrops. -At this visit, his sugars are higher.  He is only checking sugars in the morning and I strongly advised him to start checking later in the day also.  I would have preferred to start an SGLT 2 inhibitor or at least a DPP 4 inhibitor, but unfortunately his insurance did not cover this.  I am hoping that after the first of the year, we can try Jardiance.  Until then, will increase glipizide to 5 mg twice a day.  I advised him to call his insurance and check if SGLT2 inhibitors are covered and let me know as soon as he finds out. - I advised him to: Patient Instructions  Please continue: - Metformin 1000 mg 2x a day  Please increase: - Glipizide to 5 mg 2x a day before meals  Please check with your insurance if they cover: - Jardiance - Invokana - Farxiga - Steglatro  Please return in 4  months with your sugar log.  - today, HbA1c is 7.1% (higher) - continue checking sugars at different times of the day - check 1x a day, rotating checks - advised for yearly eye exams >> he is UTD - Return to clinic in 4 mo with sugar log      2. B12 def. -We checked a vitamin B12 level as he was on metformin and this was low in 10/2015. -We initially started him on intramuscular injections of B12, then switch to 5000 mcg B12 daily -At last visit, his level was slightly elevated so we decreased the dose to 5000 mcg every other day. -Recently, B12 was only slightly elevated.  For now, we can continue the current dose  3. HL - Reviewed latest lipid panel from this mo: LDL excellent, decreased TG high Lab Results  Component Value Date   CHOL 136 08/19/2018   HDL 45.20 08/19/2018   LDLCALC 52 08/19/2018   LDLDIRECT 139.8 07/08/2014   TRIG 193.0 (H) 08/19/2018   CHOLHDL 3 08/19/2018  - Continues Lipitor  without side effects.   Philemon Kingdom, MD PhD [facility] End[facility]

## 2018-09-24 ENCOUNTER — Other Ambulatory Visit: Payer: Self-pay | Admitting: Internal Medicine

## 2018-09-24 DIAGNOSIS — E1122 Type 2 diabetes mellitus with diabetic chronic kidney disease: Secondary | ICD-10-CM

## 2018-09-24 DIAGNOSIS — E538 Deficiency of other specified B group vitamins: Secondary | ICD-10-CM

## 2018-09-24 DIAGNOSIS — N182 Chronic kidney disease, stage 2 (mild): Principal | ICD-10-CM

## 2018-09-28 ENCOUNTER — Encounter (HOSPITAL_COMMUNITY): Payer: Self-pay | Admitting: *Deleted

## 2018-09-28 ENCOUNTER — Other Ambulatory Visit: Payer: Self-pay

## 2018-09-28 ENCOUNTER — Observation Stay (HOSPITAL_COMMUNITY)
Admission: EM | Admit: 2018-09-28 | Discharge: 2018-09-29 | Disposition: A | Discharge status: Home / Self Care | Payer: Medicare Other | Attending: Student | Admitting: Student

## 2018-09-28 ENCOUNTER — Emergency Department (HOSPITAL_COMMUNITY): Payer: Medicare Other

## 2018-09-28 DIAGNOSIS — Z7984 Long term (current) use of oral hypoglycemic drugs: Secondary | ICD-10-CM | POA: Insufficient documentation

## 2018-09-28 DIAGNOSIS — Z87891 Personal history of nicotine dependence: Secondary | ICD-10-CM | POA: Insufficient documentation

## 2018-09-28 DIAGNOSIS — K219 Gastro-esophageal reflux disease without esophagitis: Secondary | ICD-10-CM | POA: Insufficient documentation

## 2018-09-28 DIAGNOSIS — I252 Old myocardial infarction: Secondary | ICD-10-CM | POA: Diagnosis not present

## 2018-09-28 DIAGNOSIS — Z85528 Personal history of other malignant neoplasm of kidney: Secondary | ICD-10-CM | POA: Diagnosis not present

## 2018-09-28 DIAGNOSIS — I10 Essential (primary) hypertension: Secondary | ICD-10-CM | POA: Diagnosis present

## 2018-09-28 DIAGNOSIS — R040 Epistaxis: Principal | ICD-10-CM | POA: Diagnosis present

## 2018-09-28 DIAGNOSIS — M5416 Radiculopathy, lumbar region: Secondary | ICD-10-CM | POA: Diagnosis not present

## 2018-09-28 DIAGNOSIS — I2510 Atherosclerotic heart disease of native coronary artery without angina pectoris: Secondary | ICD-10-CM | POA: Insufficient documentation

## 2018-09-28 DIAGNOSIS — Z7982 Long term (current) use of aspirin: Secondary | ICD-10-CM | POA: Diagnosis not present

## 2018-09-28 DIAGNOSIS — M1990 Unspecified osteoarthritis, unspecified site: Secondary | ICD-10-CM | POA: Diagnosis not present

## 2018-09-28 DIAGNOSIS — Z8249 Family history of ischemic heart disease and other diseases of the circulatory system: Secondary | ICD-10-CM | POA: Diagnosis not present

## 2018-09-28 DIAGNOSIS — E669 Obesity, unspecified: Secondary | ICD-10-CM | POA: Diagnosis not present

## 2018-09-28 DIAGNOSIS — E119 Type 2 diabetes mellitus without complications: Secondary | ICD-10-CM | POA: Insufficient documentation

## 2018-09-28 DIAGNOSIS — Z6835 Body mass index (BMI) 35.0-35.9, adult: Secondary | ICD-10-CM | POA: Diagnosis not present

## 2018-09-28 DIAGNOSIS — E875 Hyperkalemia: Secondary | ICD-10-CM

## 2018-09-28 DIAGNOSIS — R599 Enlarged lymph nodes, unspecified: Secondary | ICD-10-CM | POA: Insufficient documentation

## 2018-09-28 DIAGNOSIS — Z955 Presence of coronary angioplasty implant and graft: Secondary | ICD-10-CM | POA: Insufficient documentation

## 2018-09-28 DIAGNOSIS — G4700 Insomnia, unspecified: Secondary | ICD-10-CM | POA: Insufficient documentation

## 2018-09-28 DIAGNOSIS — E1151 Type 2 diabetes mellitus with diabetic peripheral angiopathy without gangrene: Secondary | ICD-10-CM

## 2018-09-28 DIAGNOSIS — R112 Nausea with vomiting, unspecified: Secondary | ICD-10-CM | POA: Diagnosis present

## 2018-09-28 DIAGNOSIS — E785 Hyperlipidemia, unspecified: Secondary | ICD-10-CM | POA: Insufficient documentation

## 2018-09-28 DIAGNOSIS — E860 Dehydration: Secondary | ICD-10-CM | POA: Diagnosis present

## 2018-09-28 DIAGNOSIS — Z79899 Other long term (current) drug therapy: Secondary | ICD-10-CM | POA: Insufficient documentation

## 2018-09-28 DIAGNOSIS — Z905 Acquired absence of kidney: Secondary | ICD-10-CM | POA: Diagnosis not present

## 2018-09-28 DIAGNOSIS — D649 Anemia, unspecified: Secondary | ICD-10-CM | POA: Diagnosis not present

## 2018-09-28 DIAGNOSIS — N179 Acute kidney failure, unspecified: Secondary | ICD-10-CM

## 2018-09-28 LAB — CBC WITH DIFFERENTIAL/PLATELET
Abs Immature Granulocytes: 0.03 10*3/uL (ref 0.00–0.07)
Basophils Absolute: 0.1 10*3/uL (ref 0.0–0.1)
Basophils Relative: 1 %
Eosinophils Absolute: 0.2 10*3/uL (ref 0.0–0.5)
Eosinophils Relative: 2 %
HCT: 36.8 % — ABNORMAL LOW (ref 39.0–52.0)
Hemoglobin: 11.3 g/dL — ABNORMAL LOW (ref 13.0–17.0)
Immature Granulocytes: 0 %
Lymphocytes Relative: 14 %
Lymphs Abs: 1.3 10*3/uL (ref 0.7–4.0)
MCH: 28.1 pg (ref 26.0–34.0)
MCHC: 30.7 g/dL (ref 30.0–36.0)
MCV: 91.5 fL (ref 80.0–100.0)
MONOS PCT: 9 %
Monocytes Absolute: 0.8 10*3/uL (ref 0.1–1.0)
Neutro Abs: 6.9 10*3/uL (ref 1.7–7.7)
Neutrophils Relative %: 74 %
Platelets: 261 10*3/uL (ref 150–400)
RBC: 4.02 MIL/uL — ABNORMAL LOW (ref 4.22–5.81)
RDW: 13.2 % (ref 11.5–15.5)
WBC: 9.2 10*3/uL (ref 4.0–10.5)
nRBC: 0 % (ref 0.0–0.2)

## 2018-09-28 LAB — I-STAT CHEM 8, ED
BUN: 36 mg/dL — ABNORMAL HIGH (ref 8–23)
CALCIUM ION: 1.14 mmol/L — AB (ref 1.15–1.40)
Chloride: 103 mmol/L (ref 98–111)
Creatinine, Ser: 1.4 mg/dL — ABNORMAL HIGH (ref 0.61–1.24)
Glucose, Bld: 208 mg/dL — ABNORMAL HIGH (ref 70–99)
HCT: 34 % — ABNORMAL LOW (ref 39.0–52.0)
Hemoglobin: 11.6 g/dL — ABNORMAL LOW (ref 13.0–17.0)
Potassium: 5.2 mmol/L — ABNORMAL HIGH (ref 3.5–5.1)
Sodium: 136 mmol/L (ref 135–145)
TCO2: 24 mmol/L (ref 22–32)

## 2018-09-28 LAB — TYPE AND SCREEN
ABO/RH(D): A POS
Antibody Screen: NEGATIVE

## 2018-09-28 MED ORDER — CEFAZOLIN SODIUM-DEXTROSE 1-4 GM/50ML-% IV SOLN
1.0000 g | Freq: Once | INTRAVENOUS | Status: AC
  Administered 2018-09-28: 1 g via INTRAVENOUS
  Filled 2018-09-28: qty 50

## 2018-09-28 MED ORDER — ONDANSETRON HCL 4 MG/2ML IJ SOLN
4.0000 mg | Freq: Once | INTRAMUSCULAR | Status: AC
  Administered 2018-09-28: 4 mg via INTRAVENOUS
  Filled 2018-09-28: qty 2

## 2018-09-28 MED ORDER — SODIUM CHLORIDE 0.9 % IV SOLN: Freq: Once | INTRAVENOUS | Status: DC

## 2018-09-28 MED ORDER — SODIUM CHLORIDE 0.9 % IV BOLUS
500.0000 mL | Freq: Once | INTRAVENOUS | Status: AC
  Administered 2018-09-28: 500 mL via INTRAVENOUS

## 2018-09-28 MED ORDER — CEPHALEXIN 250 MG PO CAPS
500.0000 mg | ORAL_CAPSULE | Freq: Once | ORAL | Status: DC
  Filled 2018-09-28: qty 2

## 2018-09-28 NOTE — ED Notes (Signed)
Patient transported to CT 

## 2018-09-28 NOTE — ED Notes (Signed)
Attempted orthostatic vitals on pt per Dr request. Obtaining vitals were successful when lying and sitting. Pt attempted to stand and became very dizzy and began vomiting blood. Pt placed back in bed in position of comfort. RN and Dr informed.

## 2018-09-28 NOTE — ED Triage Notes (Signed)
Pt in c/o bleeding from an oral surgical site, had some posts removed from his upper jaw and developed bleeding from site and nose bleed, bleeding controlled at that time, denies blood thinner but takes ASA daily

## 2018-09-28 NOTE — ED Provider Notes (Addendum)
[facility] EMERGENCY DEPARTMENT Provider Note   CSN: [id number] Arrival date & time: 09/28/18  1803     History   Chief Complaint Chief Complaint  Patient presents with  . Post-op Problem    HPI Charles [name] is a 78 y.o. male.  78 year old male with prior medical history detailed below presents for evaluation of epistaxis.  Patient reports that he had dental implants removed from his right upper jaw earlier today.  This was performed by Dr. Mikey Bussing [phone]).  Shortly after removal of the dental implants the patient reported onset of bleeding from the right nare.  This was associated with nausea.  He did vomit blood prior to arrival in the ED this evening.  Patient denies fever.  Patient denies other complaint.  Patient takes a baby aspirin every day.  The history is provided by the patient and medical records.  Illness  Location:  Epistaxis  Severity:  Moderate Onset quality:  Gradual Duration:  12 hours Timing:  Constant Progression:  Worsening Chronicity:  New   Past Medical History:  Diagnosis Date  . Anemia    "when I was a lad"  . Arthritis    "right femur" (10/19/2015)  . AVM (arteriovenous malformation) of colon    2 - non-bleeding 2013  . Biceps muscle tear    right  . Cancer of kidney ([place]) 2000   left nephrectomy  . Coronary artery disease   . Eczema   . GERD (gastroesophageal reflux disease)   . Hernia, ventral   . Hyperlipidemia   . Hypertension   . Obesity   . Personal history of adenomatous colonic polyps 07/20/2012   3 + adenomas 2009 07/20/2012 2 diminutive polyps    . Pneumonia 1942; 1950s X 1  . Type II diabetes mellitus ([place])   . Walking pneumonia 2000's X 1    Patient Active Problem List   Diagnosis Date Noted  . Lumbar radiculopathy 08/06/2017  . Greater trochanteric bursitis of left hip 07/04/2017  . Rotator cuff arthropathy of left shoulder 01/24/2017  . AC (acromioclavicular) arthritis 01/24/2017  .  Vitamin B12 deficiency 01/18/2016  . NSTEMI (non-ST elevated myocardial infarction) ([place]) 10/19/2015  . Acute non Q wave MI (myocardial infarction), initial episode of care ([place]) 10/19/2015  . Peripheral neuropathy 10/13/2015  . 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[place]) 07/14/2015  . Actinic keratosis of right cheek 10/04/2014  . Contact dermatitis and eczema 02/10/2014  . 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 Acute blood loss anemia 08/27/2013  . Insomnia 08/27/2013  . Hip fracture, right ([place]) 07/25/2013  . 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 Renal cell carcinoma ([place]) 07/25/2013  . History of colonic polyps 07/20/2012  . Hyperlipidemia 11/13/2007  . Obesity 11/13/2007  . Essential hypertension 04/23/2007    Past Surgical History:  Procedure Laterality Date  . CARDIAC CATHETERIZATION N/A 10/19/2015   Procedure: Left Heart Cath and Coronary Angiography;  Surgeon: Charolette Forward, MD;  Location: [facility];  Service: Cardiovascular;  Laterality: N/A;  . CARDIAC CATHETERIZATION N/A 10/19/2015   Procedure: Coronary Stent Intervention;  Surgeon: Charolette Forward, MD;  Location: [facility];  Service: Cardiovascular;  Laterality: N/A;  . CATARACT EXTRACTION, BILATERAL Bilateral 01/2018   Dr. Kathrin Penner did surgery and he developed MD and is seeing specialist now for MD treatment  . COLONOSCOPY     "nothing showed up this time"  . COLONOSCOPY W/ BIOPSIES AND POLYPECTOMY  X 1  . CORONARY ANGIOPLASTY    . FEMUR IM NAIL Right 07/26/2013   Procedure: INTRAMEDULLARY (IM) NAIL FEMORAL subtrochanteric;  Surgeon: Mauri Pole, MD;  Location: [facility];  Service: Orthopedics;  Laterality: Right;  . FRACTURE SURGERY    . INGUINAL HERNIA REPAIR Left >3 times  . INGUINAL HERNIA REPAIR Right 2012  . NEPHRECTOMY Left 2000  . TONSILLECTOMY  1940s        Home Medications    Prior to Admission medications   Medication Sig Start Date End Date  Taking? Authorizing Provider  aspirin 81 MG chewable tablet Chew 1 tablet (81 mg total) by mouth daily. 10/22/15   Dixie Dials, MD  atorvastatin (LIPITOR) 40 MG tablet Take 1 tablet (40 mg total) by mouth daily at 6 PM. 10/22/15   Dixie Dials, MD  Bioflavonoid Products (VITAMIN C) CHEW Chew by mouth.    [provider]  cyanocobalamin 1000 MCG tablet Take 5,000 mcg by mouth daily.    [provider]  EPINEPHrine (EPIPEN 2-PAK) 0.3 mg/0.3 mL DEVI Inject 0.3 mg into the muscle once.      [provider]  glipiZIDE (GLUCOTROL) 5 MG tablet TAKE 1 TABLET BY MOUTH 2  TIMES DAILY BEFORE A MEAL 09/24/18   Philemon Kingdom, MD  lisinopril (PRINIVIL,ZESTRIL) 5 MG tablet Take 1 tablet (5 mg total) by mouth daily. 10/22/15   Dixie Dials, MD  metFORMIN (GLUCOPHAGE) 1000 MG tablet Take 1 tablet by mouth two  times daily with meals 12/11/17   Philemon Kingdom, MD  metoprolol succinate (TOPROL-XL) 50 MG 24 hr tablet  02/02/16   [provider]  Multiple Vitamins-Minerals (ICAPS PO) Take 1 tablet by mouth daily.    [provider]  nitroGLYCERIN (NITROSTAT) 0.4 MG SL tablet Place 1 tablet (0.4 mg total) under the tongue every 5 (five) minutes as needed for chest pain. 10/22/15   Dixie Dials, MD  [facility] DELICA LANCETS FINE MISC Use to check sugar daily 11/25/16   Philemon Kingdom, MD  [facility] VERIO test strip USE 1 STRIP TO CHECK GLUCOSE ONCE DAILY 01/01/18   Philemon Kingdom, MD  vitamin E 400 UNIT capsule Take 400 Units by mouth daily.    [provider]    Family History Family History  Problem Relation Age of Onset  . Drug abuse Other   . Cancer Other   . Heart disease Other   . Lung disease Other   . Diabetes Mother   . Diabetes Sister   . Colon cancer Neg Hx   . Stomach cancer Neg Hx     Social History Social History   Tobacco Use  . Smoking status: Former Smoker    Years: 20.00    Types: Cigarettes, Pipe, Cigars    Last attempt to  quit: 09/09/1968    Years since quitting: 50.0  . Smokeless tobacco: Never Used  . Tobacco comment: smoking cigars- quit in 13   Substance Use Topics  . Alcohol use: Yes    Alcohol/week: 1.0 standard drinks    Types: 1 Cans of beer per week    Comment: occasionally   . Drug use: No     Allergies   Patient has no known allergies.   Review of Systems Review of Systems  All other systems reviewed and are negative.    Physical Exam Updated Vital Signs BP 103/65 (BP Location: Right Arm)   Pulse 98   Temp 98 F (36.7 C) (Oral)   Resp 20  Ht 5\' 11"  (1.803 m)   Wt 114.3 kg   SpO2 98%   BMI 35.14 kg/m   Physical Exam Vitals signs and nursing note reviewed.  Constitutional:      General: He is not in acute distress.    Appearance: Normal appearance. He is well-developed.  HENT:     Head: Normocephalic and atraumatic.     Nose:     Comments: Active bleeding noted from the right nare.      Mouth/Throat:     Comments: Intraoral suture line is intact without bleeding noted  Eyes:     Conjunctiva/sclera: Conjunctivae normal.     Pupils: Pupils are equal, round, and reactive to light.  Neck:     Musculoskeletal: Normal range of motion and neck supple.  Cardiovascular:     Rate and Rhythm: Normal rate and regular rhythm.     Heart sounds: Normal heart sounds.  Pulmonary:     Effort: Pulmonary effort is normal. No respiratory distress.     Breath sounds: Normal breath sounds.  Abdominal:     General: There is no distension.     Palpations: Abdomen is soft.     Tenderness: There is no abdominal tenderness.  Musculoskeletal: Normal range of motion.        General: No deformity.  Skin:    General: Skin is warm and dry.  Neurological:     Mental Status: He is alert and oriented to person, place, and time.      ED Treatments / Results  Labs (all labs ordered are listed, but only abnormal results are displayed) Labs Reviewed  CBC WITH DIFFERENTIAL/PLATELET -  Abnormal; Notable for the following components:      Result Value   RBC 4.02 (*)    Hemoglobin 11.3 (*)    HCT 36.8 (*)    All other components within normal limits  I-STAT CHEM 8, ED - Abnormal; Notable for the following components:   Potassium 5.2 (*)    BUN 36 (*)    Creatinine, Ser 1.40 (*)    Glucose, Bld 208 (*)    Calcium, Ion 1.14 (*)    Hemoglobin 11.6 (*)    HCT 34.0 (*)    All other components within normal limits  TYPE AND SCREEN    EKG None  Radiology Ct Maxillofacial Wo Contrast  Result Date: 09/28/2018 CLINICAL DATA:  Bleeding from oral surgical site, recent post removal, nose bleeding EXAM: CT MAXILLOFACIAL WITHOUT CONTRAST TECHNIQUE: Multidetector CT imaging of the maxillofacial structures was performed. Multiplanar CT image reconstructions were also generated. COMPARISON:  None. FINDINGS: Osseous: Mandibular heads are normally position. No mandibular fracture. Multiple missing mandibular teeth. Root lucency around the right mandibular first premolar. Pterygoid plates and zygomatic arches are intact. No acute nasal bone fracture. Maxillary evaluation is limited by beam artifact from metal in the oral cavity. Relatively smooth lucency within the alveolar process of the right maxillary bone with suspected bone graft material. Protrusion of suspected graft material to the base of the right nasal cavity. Small suspected osseous fragments adjacent to the surgical site. Orbits: Negative. No traumatic or inflammatory finding. Sinuses: Mild mucosal thickening in the ethmoid sinuses. No acute fluid levels. Right nasal tube. Soft tissues: Multiple enlarged inferior parotid lymph nodes. On the right, lymph node measures 2.3 cm and contains low-attenuation foci. On the left, lymph nodes measure up to 2.4 cm. Coarse calcifications within the left parotid salivary gland. Limited intracranial: Atrophy. IMPRESSION: 1. Relatively smooth osseous defect  within the right maxilla with dense  material in the region, possibly representing bone graft material. This extends into the base of the right nasal cavity. There are multiple small bony fragments anterior to what is presumed to represent the surgical site, these are suspect for small fracture fragments. 2. Mildly dense masses at the inferior aspects of the parotid glands, possibly representing enlarged nodes or other solid soft tissue mass. Recommend short interval 6 week ultrasound or contrast-enhanced neck CT follow-up to evaluate for persistence or resolution. Electronically Signed   By: Donavan Foil M.D.   On: 09/28/2018 21:38    Procedures .Epistaxis Management Date/Time: 09/28/2018 11:20 PM Performed by: Valarie Merino, MD Authorized by: Valarie Merino, MD   Consent:    Consent obtained:  Verbal   Consent given by:  Patient   Risks discussed:  Bleeding, infection, nasal injury and pain   Alternatives discussed:  No treatment Anesthesia (see MAR for exact dosages):    Anesthesia method:  None Procedure details:    Treatment site:  R anterior   Repair method: Rapid Rhino.   Treatment complexity:  Limited   Treatment episode: initial   Post-procedure details:    Assessment:  Bleeding stopped   Patient tolerance of procedure:  Tolerated well, no immediate complications   (including critical care time)  Medications Ordered in ED Medications  sodium chloride 0.9 % bolus 500 mL (500 mLs Intravenous New Bag/Given 09/28/18 2314)  ceFAZolin (ANCEF) IVPB 1 g/50 mL premix (has no administration in time range)  ondansetron (ZOFRAN) injection 4 mg (4 mg Intravenous Given 09/28/18 2315)     Initial Impression / Assessment and Plan / ED Course  I have reviewed the triage vital signs and the nursing notes.  Pertinent labs & imaging results that were available during my care of the patient were reviewed by me and considered in my medical decision making (see chart for details).     MDM  Screen complete  Patient is  presenting for bleeding from the right nare.  This was associated with recent removal of dental implants.  CT imaging suggest involvement of the site of the dental implant with the floor the right nare.  Case discussed with Dr. Wilburn Cornelia of ENT.  Rapid Rhino placed with good initial control of the epistaxis.  Dr. Geralynn Ochs (Periodontist) is aware of the outcome with regard to this patient.  Patient was unable to ambulate for discharge.  He became woozy and then vomited blood.  Patient appears to swallow significant amounts of blood prior to control the epistaxis.  Patient would likely benefit from overnight observation and rehydration.  Hospitalist service is paged for admission.  Final Clinical Impressions(s) / ED Diagnoses   Final diagnoses:  Epistaxis  Nausea and vomiting, intractability of vomiting not specified, unspecified vomiting type  Dehydration    ED Discharge Orders    None       Valarie Merino, MD 09/28/18 2323    Valarie Merino, MD 09/29/18 [phone]

## 2018-09-29 DIAGNOSIS — R040 Epistaxis: Secondary | ICD-10-CM | POA: Diagnosis not present

## 2018-09-29 DIAGNOSIS — E875 Hyperkalemia: Secondary | ICD-10-CM

## 2018-09-29 DIAGNOSIS — N179 Acute kidney failure, unspecified: Secondary | ICD-10-CM

## 2018-09-29 LAB — BASIC METABOLIC PANEL
Anion gap: 10 (ref 5–15)
Anion gap: 10 (ref 5–15)
BUN: 53 mg/dL — ABNORMAL HIGH (ref 8–23)
BUN: 54 mg/dL — ABNORMAL HIGH (ref 8–23)
CO2: 21 mmol/L — ABNORMAL LOW (ref 22–32)
CO2: 23 mmol/L (ref 22–32)
Calcium: 8.8 mg/dL — ABNORMAL LOW (ref 8.9–10.3)
Calcium: 9.2 mg/dL (ref 8.9–10.3)
Chloride: 106 mmol/L (ref 98–111)
Chloride: 107 mmol/L (ref 98–111)
Creatinine, Ser: 1.62 mg/dL — ABNORMAL HIGH (ref 0.61–1.24)
Creatinine, Ser: 1.46 mg/dL — ABNORMAL HIGH (ref 0.61–1.24)
GFR calc Af Amer: 47 mL/min — ABNORMAL LOW (ref >60)
GFR calc non Af Amer: 46 mL/min — ABNORMAL LOW (ref >60)
GFR, EST AFRICAN AMERICAN: 53 mL/min — AB (ref >60)
GFR, EST NON AFRICAN AMERICAN: 40 mL/min — AB (ref >60)
Glucose, Bld: 212 mg/dL — ABNORMAL HIGH (ref 70–99)
Glucose, Bld: 138 mg/dL — ABNORMAL HIGH (ref 70–99)
Potassium: 5.7 mmol/L — ABNORMAL HIGH (ref 3.5–5.1)
Potassium: 4.5 mmol/L (ref 3.5–5.1)
Sodium: 137 mmol/L (ref 135–145)
Sodium: 140 mmol/L (ref 135–145)

## 2018-09-29 LAB — GLUCOSE, CAPILLARY
Glucose-Capillary: 164 mg/dL — ABNORMAL HIGH (ref 70–99)
Glucose-Capillary: 160 mg/dL — ABNORMAL HIGH (ref 70–99)
Glucose-Capillary: 123 mg/dL — ABNORMAL HIGH (ref 70–99)

## 2018-09-29 LAB — CBC
HCT: 31.3 % — ABNORMAL LOW (ref 39.0–52.0)
HCT: 29.4 % — ABNORMAL LOW (ref 39.0–52.0)
Hemoglobin: 9.9 g/dL — ABNORMAL LOW (ref 13.0–17.0)
Hemoglobin: 9.4 g/dL — ABNORMAL LOW (ref 13.0–17.0)
MCH: 29.3 pg (ref 26.0–34.0)
MCH: 29 pg (ref 26.0–34.0)
MCHC: 31.6 g/dL (ref 30.0–36.0)
MCHC: 32 g/dL (ref 30.0–36.0)
MCV: 92.6 fL (ref 80.0–100.0)
MCV: 90.7 fL (ref 80.0–100.0)
Platelets: 223 10*3/uL (ref 150–400)
Platelets: 218 10*3/uL (ref 150–400)
RBC: 3.38 MIL/uL — ABNORMAL LOW (ref 4.22–5.81)
RBC: 3.24 MIL/uL — ABNORMAL LOW (ref 4.22–5.81)
RDW: 13.2 % (ref 11.5–15.5)
RDW: 13.4 % (ref 11.5–15.5)
WBC: 8.7 10*3/uL (ref 4.0–10.5)
WBC: 7.1 10*3/uL (ref 4.0–10.5)
nRBC: 0 % (ref 0.0–0.2)
nRBC: 0 % (ref 0.0–0.2)

## 2018-09-29 LAB — HEMOGLOBIN A1C
Hgb A1c MFr Bld: 7.9 % — ABNORMAL HIGH (ref 4.8–5.6)
MEAN PLASMA GLUCOSE: 180.03 mg/dL

## 2018-09-29 LAB — HEMOGLOBIN AND HEMATOCRIT, BLOOD
HCT: 30 % — ABNORMAL LOW (ref 39.0–52.0)
Hemoglobin: 9.4 g/dL — ABNORMAL LOW (ref 13.0–17.0)

## 2018-09-29 MED ORDER — INSULIN ASPART 100 UNIT/ML ~~LOC~~ SOLN: 0.0000 [IU] | Freq: Every day | SUBCUTANEOUS | Status: DC

## 2018-09-29 MED ORDER — CALCIUM GLUCONATE-NACL 1-0.675 GM/50ML-% IV SOLN
1.0000 g | Freq: Once | INTRAVENOUS | Status: AC
  Administered 2018-09-29: 1000 mg via INTRAVENOUS
  Filled 2018-09-29: qty 50

## 2018-09-29 MED ORDER — ACETAMINOPHEN 650 MG RE SUPP: 650.0000 mg | Freq: Four times a day (QID) | RECTAL | Status: DC | PRN

## 2018-09-29 MED ORDER — METOPROLOL SUCCINATE ER 50 MG PO TB24
50.0000 mg | ORAL_TABLET | Freq: Every day | ORAL | Status: DC
  Administered 2018-09-29: 50 mg via ORAL
  Filled 2018-09-29: qty 1

## 2018-09-29 MED ORDER — INSULIN ASPART 100 UNIT/ML ~~LOC~~ SOLN: 0.0000 [IU] | Freq: Three times a day (TID) | SUBCUTANEOUS | Status: DC

## 2018-09-29 MED ORDER — DEXTROSE 50 % IV SOLN
25.0000 mL | Freq: Once | INTRAVENOUS | Status: AC
  Administered 2018-09-29: 25 mL via INTRAVENOUS
  Filled 2018-09-29: qty 50

## 2018-09-29 MED ORDER — INSULIN ASPART 100 UNIT/ML ~~LOC~~ SOLN
10.0000 [IU] | Freq: Once | SUBCUTANEOUS | Status: AC
  Administered 2018-09-29: 10 [IU] via SUBCUTANEOUS

## 2018-09-29 MED ORDER — ATORVASTATIN CALCIUM 40 MG PO TABS: 40.0000 mg | ORAL_TABLET | Freq: Every day | ORAL | Status: DC

## 2018-09-29 MED ORDER — ONDANSETRON HCL 4 MG/2ML IJ SOLN: 4.0000 mg | Freq: Four times a day (QID) | INTRAMUSCULAR | Status: DC | PRN

## 2018-09-29 MED ORDER — ACETAMINOPHEN 325 MG PO TABS: 650.0000 mg | ORAL_TABLET | Freq: Four times a day (QID) | ORAL | Status: DC | PRN

## 2018-09-29 MED ORDER — SALINE SPRAY 0.65 % NA SOLN: 2.0000 | Freq: Three times a day (TID) | NASAL | 0 refills | Status: DC

## 2018-09-29 MED ORDER — SODIUM CHLORIDE 0.9 % IV SOLN
INTRAVENOUS | Status: AC
  Administered 2018-09-29: 04:00:00 via INTRAVENOUS

## 2018-09-29 MED ORDER — NEOMYCIN-POLYMYXIN-DEXAMETH 3.5-10000-0.1 OP OINT
1.0000 "application " | TOPICAL_OINTMENT | Freq: Two times a day (BID) | OPHTHALMIC | Status: DC
  Administered 2018-09-29: 1 via OPHTHALMIC
  Filled 2018-09-29 (×2): qty 3.5

## 2018-09-29 MED ORDER — INSULIN GLARGINE 100 UNIT/ML ~~LOC~~ SOLN
10.0000 [IU] | Freq: Once | SUBCUTANEOUS | Status: DC
  Filled 2018-09-29: qty 0.1

## 2018-09-29 MED ORDER — CEPHALEXIN 500 MG PO CAPS: 500.0000 mg | ORAL_CAPSULE | Freq: Four times a day (QID) | ORAL | 0 refills | Status: AC

## 2018-09-29 NOTE — Discharge Summary (Signed)
Physician Discharge Summary  Charles [name] NKN:[id number] DOB: 05/02/1941 DOA: 09/28/2018  PCP: [name], NP  Admit date: 09/28/2018 Discharge date: 09/29/2018  Admitted From: Home Disposition: Home  Recommendations for Outpatient Follow-up:  1. Follow up with [facility] ENT in 2 to 3 days. 2. Please obtain CBC in one week 3. Please follow up on the following pending results: none  Home Health: None Equipment/Devices: None  Discharge Condition: Stable CODE STATUS: Full code  HPI: Per Dr. Vincente Liberty Charles [name] is a 78 y.o. male with medical history significant of anemia, arthritis, CAD, GERD, hypertension, hyperlipidemia, type 2 diabetes presenting to the hospital for evaluation of a nosebleed.  Patient states he had dental implants removed from his upper jaw earlier today and since then has been experiencing bleeding from his right nare.  Removal of dental implants was performed by Dr. Geralynn Ochs and patient states Dr. Geralynn Ochs (periodontist) is aware of the bleeding..  States while in the ED he felt nauseous and vomited blood. He felt dizzy when standing up.  He takes aspirin 81 mg daily.  Hospital Course: History and HPI as above.  Patient remained stable overnight and this morning.  Nasal bleeding stopped with Rhino nasal device. H&H remained stable. Discussed patient with Dr. Blenda Nicely, [facility] ENT who recommended keeping Rhino nasal device for 72 hours, saline nose spray to prevent the device from drying, keflex for  prophylaxisis, and follow up in clinic in 2-3 days.   Discharge Diagnoses:  Principal Problem:   Epistaxis Active Problems:   Essential hypertension   Hyperkalemia   AKI (acute kidney injury) ([place])   Type 2 diabetes mellitus ([facility])  Epistaxis following dental procedure (tooth extraction): Maxillofacial CT showing smooth osseous defect within the right maxilla with dense material in the region, possibly representing bone graft material extending into the base of the right  nasal cavity.  Also showing multiple small bony fragments anterior to the presumed surgical site, thought to represent small fracture fragments.  Bleeding controlled by Rhino device.  H&H stable since admission. Discussed patient with Dr. Blenda Nicely, [facility] ENT who recommended keeping Rhino nasal device for 72 hours, saline nose spray to prevent the device from drying, keflex for prophylaxisis, and follow up in clinic in 2-3 days. Aspirin discontinued on discharge.  Recommend repeat CBC at follow-up.  Borderline hyperkalemia: Resolved.  Mild AKI: Improved Recommend repeat BMP and follow-up  Parotid gland abnormality CT showing mildly dense masses at the inferior aspects of the parotid glands, possibly representing enlarged nodes or other solid soft tissue mass.  -Recommend short interval 6 week ultrasound or contrast-enhanced neck CT follow-up   Other chronic medical conditions including hypertension, hyperlipidemia and diabetes stable.  Discharged on home medications.  Discharge Instructions  Discharge Instructions    Diet - low sodium heart healthy   Complete by:  As directed    Diet Carb Modified   Complete by:  As directed    Increase activity slowly   Complete by:  As directed      Allergies as of 09/29/2018   No Known Allergies     Medication List    STOP taking these medications   aspirin 81 MG chewable tablet     TAKE these medications   atorvastatin 40 MG tablet Commonly known as:  LIPITOR Take 1 tablet (40 mg total) by mouth daily at 6 PM.   cephALEXin 500 MG capsule Commonly known as:  KEFLEX Take 1 capsule (500 mg total) by mouth 4 (four) times  daily for 5 days.   cyanocobalamin 1000 MCG tablet Take 5,000 mcg by mouth every other day.   EPIPEN 2-PAK 0.3 mg/0.3 mL Devi Generic drug:  EPINEPHrine Inject 0.3 mg into the muscle daily as needed (allergic reaction).   glipiZIDE 5 MG tablet Commonly known as:  GLUCOTROL TAKE 1 TABLET BY MOUTH 2  TIMES DAILY BEFORE  A MEAL What changed:  See the new instructions.   ICAPS PO Take 1 tablet by mouth daily.   lisinopril 5 MG tablet Commonly known as:  PRINIVIL,ZESTRIL Take 1 tablet (5 mg total) by mouth daily.   metFORMIN 1000 MG tablet Commonly known as:  GLUCOPHAGE Take 1 tablet by mouth two  times daily with meals   metoprolol succinate 50 MG 24 hr tablet Commonly known as:  TOPROL-XL Take 50 mg by mouth daily.   neomycin-polymyxin-dexameth 0.1 % Oint Commonly known as:  MAXITROL Place 1 application into the left eye 2 (two) times daily.   nitroGLYCERIN 0.4 MG SL tablet Commonly known as:  NITROSTAT Place 1 tablet (0.4 mg total) under the tongue every 5 (five) minutes as needed for chest pain.   ONETOUCH DELICA LANCETS FINE Misc Use to check sugar daily   ONETOUCH VERIO test strip Generic drug:  glucose blood USE 1 STRIP TO CHECK GLUCOSE ONCE DAILY   sodium chloride 0.65 % Soln nasal spray Commonly known as:  OCEAN Place 2 sprays into both nostrils 3 (three) times daily.   Vitamin C Chew Chew 1 tablet by mouth daily.   vitamin E 400 UNIT capsule Take 400 Units by mouth daily.      Follow-up Information    Jerrell Belfast, MD. Schedule an appointment as soon as possible for a visit in 2 day(s).   Specialty:  Otolaryngology Why:  Call and make a follow up appointment in 2-3 days Contact information: [address] [place] [address] [phone]           Consultations:  ENT over the phone  Procedures/Studies:  2D echo-none  Ct Maxillofacial Wo Contrast  Result Date: 09/28/2018 CLINICAL DATA:  Bleeding from oral surgical site, recent post removal, nose bleeding EXAM: CT MAXILLOFACIAL WITHOUT CONTRAST TECHNIQUE: Multidetector CT imaging of the maxillofacial structures was performed. Multiplanar CT image reconstructions were also generated. COMPARISON:  None. FINDINGS: Osseous: Mandibular heads are normally position. No mandibular fracture.  Multiple missing mandibular teeth. Root lucency around the right mandibular first premolar. Pterygoid plates and zygomatic arches are intact. No acute nasal bone fracture. Maxillary evaluation is limited by beam artifact from metal in the oral cavity. Relatively smooth lucency within the alveolar process of the right maxillary bone with suspected bone graft material. Protrusion of suspected graft material to the base of the right nasal cavity. Small suspected osseous fragments adjacent to the surgical site. Orbits: Negative. No traumatic or inflammatory finding. Sinuses: Mild mucosal thickening in the ethmoid sinuses. No acute fluid levels. Right nasal tube. Soft tissues: Multiple enlarged inferior parotid lymph nodes. On the right, lymph node measures 2.3 cm and contains low-attenuation foci. On the left, lymph nodes measure up to 2.4 cm. Coarse calcifications within the left parotid salivary gland. Limited intracranial: Atrophy. IMPRESSION: 1. Relatively smooth osseous defect within the right maxilla with dense material in the region, possibly representing bone graft material. This extends into the base of the right nasal cavity. There are multiple small bony fragments anterior to what is presumed to represent the surgical site, these are suspect for small  fracture fragments. 2. Mildly dense masses at the inferior aspects of the parotid glands, possibly representing enlarged nodes or other solid soft tissue mass. Recommend short interval 6 week ultrasound or contrast-enhanced neck CT follow-up to evaluate for persistence or resolution. Electronically Signed   By: Donavan Foil M.D.   On: 09/28/2018 21:38      Subjective: No complaints this morning.  Bleeding stopped after renal device. Rhino device.  Denies, abdominal pain, nausea, vomiting, melena or hematochezia.  Likes to go home today if possible.   Discharge Exam: Vitals:   09/29/18 0331 09/29/18 1142  BP: 133/77 110/73  Pulse: 97 99  Resp: 16 20   Temp: 97.7 F (36.5 C) 99.1 F (37.3 C)  SpO2: 98% 95%    GENERAL: Appears well. No acute distress.  HEENT: MMM.  Vision and Hearing grossly intact.  Rhino device in right naris.  No apparent nasal bleeding.  Poor dentition.  No apparent abscess or signs of infection. NECK: Supple.  No JVD.  LUNGS:  No IWOB. Good air movement. CTAB.  HEART:  RRR. Heart sounds normal. ABD: Bowel sounds present. Soft. Non tender.  EXT:   no edema bilaterally.  SKIN: no apparent skin lesion.  NEURO: Awake, alert and oriented appropriately.  No gross deficit.  PSYCH: Calm. Normal affect.  The results of significant diagnostics from this hospitalization (including imaging, microbiology, ancillary and laboratory) are listed below for reference.     Microbiology: No results found for this or any previous visit (from the past 240 hour(s)).   Labs: BNP (last 3 results) No results for input(s): BNP in the last 8760 hours. Basic Metabolic Panel: Recent Labs  Lab 09/28/18 1856 09/29/18 0223 09/29/18 1014  NA 136 137 140  K 5.2* 5.7* 4.5  CL 103 106 107  CO2  --  21* 23  GLUCOSE 208* 212* 138*  BUN 36* 53* 54*  CREATININE 1.40* 1.62* 1.46*  CALCIUM  --  8.8* 9.2   Liver Function Tests: No results for input(s): AST, ALT, ALKPHOS, BILITOT, PROT, ALBUMIN in the last 168 hours. No results for input(s): LIPASE, AMYLASE in the last 168 hours. No results for input(s): AMMONIA in the last 168 hours. CBC: Recent Labs  Lab 09/28/18 1848 09/28/18 1856 09/29/18 0223 09/29/18 0541 09/29/18 1008  WBC 9.2  --  8.7 7.1  --   NEUTROABS 6.9  --   --   --   --   HGB 11.3* 11.6* 9.9* 9.4* 9.4*  HCT 36.8* 34.0* 31.3* 29.4* 30.0*  MCV 91.5  --  92.6 90.7  --   PLT 261  --  223 218  --    Cardiac Enzymes: No results for input(s): CKTOTAL, CKMB, CKMBINDEX, TROPONINI in the last 168 hours. BNP: Invalid input(s): POCBNP CBG: Recent Labs  Lab 09/29/18 0607 09/29/18 0738 09/29/18 1139  GLUCAP 164* 160*  123*   D-Dimer No results for input(s): DDIMER in the last 72 hours. Hgb A1c Recent Labs    09/29/18 0223  HGBA1C 7.9*   Lipid Profile No results for input(s): CHOL, HDL, LDLCALC, TRIG, CHOLHDL, LDLDIRECT in the last 72 hours. Thyroid function studies No results for input(s): TSH, T4TOTAL, T3FREE, THYROIDAB in the last 72 hours.  Invalid input(s): FREET3 Anemia work up No results for input(s): VITAMINB12, FOLATE, FERRITIN, TIBC, IRON, RETICCTPCT in the last 72 hours. Urinalysis    Component Value Date/Time   COLORURINE YELLOW 11/15/2010 1414   APPEARANCEUR CLEAR 11/15/2010 1414   LABSPEC 1.023 11/15/2010  [place] 5.5 11/15/2010 1414   GLUCOSEU 100 (A) 11/15/2010 1414   HGBUR NEGATIVE 11/15/2010 1414   HGBUR negative 11/17/2009 0817   BILIRUBINUR n 08/08/2016 1147   KETONESUR NEGATIVE 11/15/2010 1414   PROTEINUR 1+ 08/08/2016 1147   PROTEINUR 30 (A) 11/15/2010 1414   UROBILINOGEN 1.0 08/08/2016 1147   UROBILINOGEN 1.0 11/15/2010 1414   NITRITE n 08/08/2016 1147   NITRITE NEGATIVE 11/15/2010 1414   LEUKOCYTESUR Negative 08/08/2016 1147   Sepsis Labs Invalid input(s): PROCALCITONIN,  WBC,  LACTICIDVEN   Time coordinating discharge: 4o minutes  SIGNED:  Mercy Riding, MD  [facility] 09/29/2018, 12:16 PM Pager [phone]  If 7PM-7AM, please contact night-coverage [email or web address] Password [email or web address]

## 2018-09-29 NOTE — Progress Notes (Signed)
Per Dr. Wilburn Cornelia assistant, pt to leave nasal packing in place for 5 days and to schedule an appointment with ENT.

## 2018-09-29 NOTE — H&P (Signed)
History and Physical    Charles [name] HCW:[id number] DOB: 01/31/1941 DOA: 09/28/2018  PCP: [name], NP Patient coming from: Home  Chief Complaint: Nosebleed  HPI: Charles [name] is a 78 y.o. male with medical history significant of anemia, arthritis, CAD, GERD, hypertension, hyperlipidemia, type 2 diabetes presenting to the hospital for evaluation of a nosebleed.  Patient states he had dental implants removed from his upper jaw earlier today and since then has been experiencing bleeding from his right nare.  Removal of dental implants was performed by Dr. Geralynn Ochs and patient states Dr. Geralynn Ochs (periodontist) is aware of the bleeding..  States while in the ED he felt nauseous and vomited blood. He felt dizzy when standing up.  He takes aspirin 81 mg daily.  Review of Systems: As per HPI otherwise 10 point review of systems negative.  Past Medical History:  Diagnosis Date  . Anemia    "when I was a lad"  . Arthritis    "right femur" (10/19/2015)  . AVM (arteriovenous malformation) of colon    2 - non-bleeding 2013  . Biceps muscle tear    right  . Cancer of kidney ([place]) 2000   left nephrectomy  . Coronary artery disease   . Eczema   . GERD (gastroesophageal reflux disease)   . Hernia, ventral   . Hyperlipidemia   . Hypertension   . Obesity   . Personal history of adenomatous colonic polyps 07/20/2012   3 + adenomas 2009 07/20/2012 2 diminutive polyps    . Pneumonia 1942; 1950s X 1  . Type II diabetes mellitus ([place])   . Walking pneumonia 2000's X 1    Past Surgical History:  Procedure Laterality Date  . CARDIAC CATHETERIZATION N/A 10/19/2015   Procedure: Left Heart Cath and Coronary Angiography;  Surgeon: Charolette Forward, MD;  Location: [facility];  Service: Cardiovascular;  Laterality: N/A;  . CARDIAC CATHETERIZATION N/A 10/19/2015   Procedure: Coronary Stent Intervention;  Surgeon: Charolette Forward, MD;  Location: [facility];  Service: Cardiovascular;  Laterality: N/A;   . CATARACT EXTRACTION, BILATERAL Bilateral 01/2018   Dr. Kathrin Penner did surgery and he developed MD and is seeing specialist now for MD treatment  . COLONOSCOPY     "nothing showed up this time"  . COLONOSCOPY W/ BIOPSIES AND POLYPECTOMY  X 1  . CORONARY ANGIOPLASTY    . FEMUR IM NAIL Right 07/26/2013   Procedure: INTRAMEDULLARY (IM) NAIL FEMORAL subtrochanteric;  Surgeon: Mauri Pole, MD;  Location: [facility];  Service: Orthopedics;  Laterality: Right;  . FRACTURE SURGERY    . INGUINAL HERNIA REPAIR Left >3 times  . INGUINAL HERNIA REPAIR Right 2012  . NEPHRECTOMY Left 2000  . TONSILLECTOMY  1940s     reports that he quit smoking about 50 years ago. His smoking use included cigarettes, pipe, and cigars. He quit after 20.00 years of use. He has never used smokeless tobacco. He reports current alcohol use of about 1.0 standard drinks of alcohol per week. He reports that he does not use drugs.  No Known Allergies  Family History  Problem Relation Age of Onset  . Drug abuse Other   . Cancer Other   . Heart disease Other   . Lung disease Other   . Diabetes Mother   . Diabetes Sister   . Colon cancer Neg Hx   . Stomach cancer Neg Hx     Prior to Admission medications   Medication Sig Start Date End  Date Taking? Authorizing Provider  aspirin 81 MG chewable tablet Chew 1 tablet (81 mg total) by mouth daily. 10/22/15  Yes Dixie Dials, MD  atorvastatin (LIPITOR) 40 MG tablet Take 1 tablet (40 mg total) by mouth daily at 6 PM. 10/22/15  Yes Dixie Dials, MD  Bioflavonoid Products (VITAMIN C) CHEW Chew 1 tablet by mouth daily.    Yes [provider]  cyanocobalamin 1000 MCG tablet Take 5,000 mcg by mouth every other day.    Yes [provider]  EPINEPHrine (EPIPEN 2-PAK) 0.3 mg/0.3 mL DEVI Inject 0.3 mg into the muscle daily as needed (allergic reaction).    Yes [provider]  glipiZIDE (GLUCOTROL) 5 MG tablet TAKE 1 TABLET BY MOUTH 2  TIMES DAILY BEFORE A  MEAL Patient taking differently: Take 2.5 mg by mouth 2 (two) times daily before a meal.  09/24/18  Yes Philemon Kingdom, MD  lisinopril (PRINIVIL,ZESTRIL) 5 MG tablet Take 1 tablet (5 mg total) by mouth daily. 10/22/15  Yes Dixie Dials, MD  metFORMIN (GLUCOPHAGE) 1000 MG tablet Take 1 tablet by mouth two  times daily with meals 12/11/17  Yes Philemon Kingdom, MD  metoprolol succinate (TOPROL-XL) 50 MG 24 hr tablet Take 50 mg by mouth daily.  02/02/16  Yes [provider]  Multiple Vitamins-Minerals (ICAPS PO) Take 1 tablet by mouth daily.   Yes [provider]  neomycin-polymyxin-dexameth (MAXITROL) 0.1 % OINT Place 1 application into the left eye 2 (two) times daily.   Yes [provider]  nitroGLYCERIN (NITROSTAT) 0.4 MG SL tablet Place 1 tablet (0.4 mg total) under the tongue every 5 (five) minutes as needed for chest pain. 10/22/15  Yes Dixie Dials, MD  [facility] DELICA LANCETS FINE MISC Use to check sugar daily 11/25/16  Yes Philemon Kingdom, MD  [facility] VERIO test strip USE 1 STRIP TO CHECK GLUCOSE ONCE DAILY 01/01/18  Yes Philemon Kingdom, MD  vitamin E 400 UNIT capsule Take 400 Units by mouth daily.   Yes [provider]    Physical Exam: Vitals:   09/29/18 0000 09/29/18 0015 09/29/18 0200 09/29/18 0215  BP: 109/70 107/62 120/83 125/75  Pulse: 88 87 85 85  Resp:      Temp:      TempSrc:      SpO2: 97% 94% 95% 95%  Weight:      Height:        Physical Exam  Constitutional: He is oriented to person, place, and time. He appears well-developed and well-nourished. No distress.  Dried blood noted on shirt and bedsheet  HENT:  Head: Normocephalic.  Mouth/Throat: Oropharynx is clear and moist.  No bleeding of gums Right nare: Rhino nasal device in place Left nare: No bleeding  Eyes: Right eye exhibits no discharge. Left eye exhibits no discharge.  Neck: Neck supple.  Cardiovascular: Normal rate, regular rhythm and intact distal pulses.   Pulmonary/Chest: Effort normal. No respiratory distress. He has no wheezes. He has no rales.  Abdominal: Soft. Bowel sounds are normal. He exhibits no distension. There is no abdominal tenderness. There is no guarding.  Musculoskeletal:        General: No edema.  Neurological: He is alert and oriented to person, place, and time.  Skin: Skin is warm and dry. He is not diaphoretic.  Psychiatric: He has a normal mood and affect. His behavior is normal.     Labs on Admission: I have personally reviewed following labs and imaging studies  CBC: Recent Labs  Lab  09/28/18 1848 09/28/18 1856 09/29/18 0223  WBC 9.2  --  8.7  NEUTROABS 6.9  --   --   HGB 11.3* 11.6* 9.9*  HCT 36.8* 34.0* 31.3*  MCV 91.5  --  92.6  PLT 261  --  161   Basic Metabolic Panel: Recent Labs  Lab 09/28/18 1856 09/29/18 0223  NA 136 137  K 5.2* 5.7*  CL 103 106  CO2  --  21*  GLUCOSE 208* 212*  BUN 36* 53*  CREATININE 1.40* 1.62*  CALCIUM  --  8.8*   GFR: Estimated Creatinine Clearance: 49.1 mL/min (A) (by C-G formula based on SCr of 1.62 mg/dL (H)). Liver Function Tests: No results for input(s): AST, ALT, ALKPHOS, BILITOT, PROT, ALBUMIN in the last 168 hours. No results for input(s): LIPASE, AMYLASE in the last 168 hours. No results for input(s): AMMONIA in the last 168 hours. Coagulation Profile: No results for input(s): INR, PROTIME in the last 168 hours. Cardiac Enzymes: No results for input(s): CKTOTAL, CKMB, CKMBINDEX, TROPONINI in the last 168 hours. BNP (last 3 results) No results for input(s): PROBNP in the last 8760 hours. HbA1C: Recent Labs    09/29/18 0223  HGBA1C 7.9*   CBG: No results for input(s): GLUCAP in the last 168 hours. Lipid Profile: No results for input(s): CHOL, HDL, LDLCALC, TRIG, CHOLHDL, LDLDIRECT in the last 72 hours. Thyroid Function Tests: No results for input(s): TSH, T4TOTAL, FREET4, T3FREE, THYROIDAB in the last 72 hours. Anemia Panel: No results for  input(s): VITAMINB12, FOLATE, FERRITIN, TIBC, IRON, RETICCTPCT in the last 72 hours. Urine analysis:    Component Value Date/Time   COLORURINE YELLOW 11/15/2010 1414   APPEARANCEUR CLEAR 11/15/2010 1414   LABSPEC 1.023 11/15/2010 1414   PHURINE 5.5 11/15/2010 1414   GLUCOSEU 100 (A) 11/15/2010 1414   HGBUR NEGATIVE 11/15/2010 1414   HGBUR negative 11/17/2009 0817   BILIRUBINUR n 08/08/2016 1147   KETONESUR NEGATIVE 11/15/2010 1414   PROTEINUR 1+ 08/08/2016 1147   PROTEINUR 30 (A) 11/15/2010 1414   UROBILINOGEN 1.0 08/08/2016 1147   UROBILINOGEN 1.0 11/15/2010 1414   NITRITE n 08/08/2016 1147   NITRITE NEGATIVE 11/15/2010 1414   LEUKOCYTESUR Negative 08/08/2016 1147    Radiological Exams on Admission: Ct Maxillofacial Wo Contrast  Result Date: 09/28/2018 CLINICAL DATA:  Bleeding from oral surgical site, recent post removal, nose bleeding EXAM: CT MAXILLOFACIAL WITHOUT CONTRAST TECHNIQUE: Multidetector CT imaging of the maxillofacial structures was performed. Multiplanar CT image reconstructions were also generated. COMPARISON:  None. FINDINGS: Osseous: Mandibular heads are normally position. No mandibular fracture. Multiple missing mandibular teeth. Root lucency around the right mandibular first premolar. Pterygoid plates and zygomatic arches are intact. No acute nasal bone fracture. Maxillary evaluation is limited by beam artifact from metal in the oral cavity. Relatively smooth lucency within the alveolar process of the right maxillary bone with suspected bone graft material. Protrusion of suspected graft material to the base of the right nasal cavity. Small suspected osseous fragments adjacent to the surgical site. Orbits: Negative. No traumatic or inflammatory finding. Sinuses: Mild mucosal thickening in the ethmoid sinuses. No acute fluid levels. Right nasal tube. Soft tissues: Multiple enlarged inferior parotid lymph nodes. On the right, lymph node measures 2.3 cm and contains  low-attenuation foci. On the left, lymph nodes measure up to 2.4 cm. Coarse calcifications within the left parotid salivary gland. Limited intracranial: Atrophy. IMPRESSION: 1. Relatively smooth osseous defect within the right maxilla with dense material in the region, possibly representing bone graft  material. This extends into the base of the right nasal cavity. There are multiple small bony fragments anterior to what is presumed to represent the surgical site, these are suspect for small fracture fragments. 2. Mildly dense masses at the inferior aspects of the parotid glands, possibly representing enlarged nodes or other solid soft tissue mass. Recommend short interval 6 week ultrasound or contrast-enhanced neck CT follow-up to evaluate for persistence or resolution. Electronically Signed   By: Donavan Foil M.D.   On: 09/28/2018 21:38    Assessment/Plan Principal Problem:   Epistaxis Active Problems:   Essential hypertension   Hyperkalemia   AKI (acute kidney injury) ([place])   Type 2 diabetes mellitus ([facility])   Epistaxis -Started after dental procedure (tooth extractions).  Maxillofacial CT showing smooth osseous defect within the right maxilla with dense material in the region, possibly representing bone graft material extending into the base of the right nasal cavity.  Also showing multiple small bony fragments anterior to the presumed surgical site, thought to represent small fracture fragments.   -Blood pressure soft in the ED, now improved with IV fluid. -Hemoglobin 11.6 on arrival, repeat 9.9.  Patient vomited blood once in the ED, likely secondary to stomach irritation from swallowing significant amounts of blood prior to control of epistaxis.  Abdominal exam benign.  Patient is not complaining of abdominal pain. -ED physician discussed the case with Dr. Wilburn Cornelia from ENT.  Rapid Rhino placed with good initial control of the epistaxis.  Received a dose of cefazolin. -Continue IV fluid  resuscitation -Keep n.p.o. -Aspiration precautions -Type and screen -Serial CBCs -IV Zofran PRN nausea -Continue to monitor hemodynamics  Borderline hyperkalemia Potassium 5.2. -Continue to monitor BMP  Mild AKI BUN 36.  Creatinine 1.4, recent baseline 1.1.  Likely prerenal in setting of acute blood loss. -Continue IV fluid -Continue to monitor BMP  Parotid gland abnormality CT showing mildly dense masses at the inferior aspects of the parotid glands, possibly representing enlarged nodes or other solid soft tissue mass.  -Recommend short interval 6 week ultrasound or contrast-enhanced neck CT follow-up to evaluate for persistence or resolution. Hypertension Hypertension -Hold antihypertensives at this time  Type 2 diabetes -Check A1c -Hold sliding scale insulin at this time as patient is n.p.o. -CBG checks  DVT prophylaxis: SCDs Code Status: Patient wishes to be full code. Family Communication: No family available. Disposition Plan: Anticipate discharge in 1 to 2 days. Consults called: ENT Admission status: Observation, telemetry   Shela Leff MD [facility] Pager [phone]  If 7PM-7AM, please contact night-coverage [email or web address] Password [email or web address]  09/29/2018, 3:06 AM

## 2018-09-29 NOTE — Discharge Instructions (Signed)
It has been a pleasure taking care of you! You were admitted due to epistaxis/nosebleed, which was likely due to your recent dental procedure.  Your bleeding stopped with nasal packing.  We recommend keeping the nasal packing for 72 hours.  We also recommend keeping it moist with saline nose spray 2-3 times a day.  We are also discharging you on antibiotic while you have the nasal packing in place to prevent infection.please follow-up with ear/nose/throat specialist in the next 2 to 3 days.  Recommend you stop taking aspirin or over-the-counter pain medications until you follow-up with ear nose throat specialist. Please, make sure to read the directions before you take them. The names and directions on how to take these medications are found on this discharge paper under medication section.  Please call and schedule a follow-up appointment with ear nose throat specialist in 2 to 3 days.  Once you are discharged, your primary care physician will handle any further medical issues. Please note that NO REFILLS for any discharge medications will be authorized once you are discharged, as it is imperative that you return to your primary care physician (or establish a relationship with a primary care physician if you do not have one) for your aftercare needs so that they can reassess your need for medications and monitor your lab values. Take care,

## 2018-09-30 ENCOUNTER — Encounter: Payer: Self-pay | Admitting: Adult Health

## 2018-10-07 ENCOUNTER — Encounter: Payer: Self-pay | Admitting: Adult Health

## 2018-10-07 ENCOUNTER — Ambulatory Visit (INDEPENDENT_AMBULATORY_CARE_PROVIDER_SITE_OTHER): Payer: Medicare Other | Admitting: Adult Health

## 2018-10-07 VITALS — BP 134/82 | Temp 97.8°F | Wt 255.0 lb

## 2018-10-07 DIAGNOSIS — K118 Other diseases of salivary glands: Secondary | ICD-10-CM

## 2018-10-07 DIAGNOSIS — D62 Acute posthemorrhagic anemia: Secondary | ICD-10-CM | POA: Diagnosis not present

## 2018-10-07 DIAGNOSIS — R5383 Other fatigue: Secondary | ICD-10-CM | POA: Diagnosis not present

## 2018-10-07 LAB — CBC WITH DIFFERENTIAL/PLATELET
Basophils Absolute: 0.1 10*3/uL (ref 0.0–0.1)
Basophils Relative: 1.6 % (ref 0.0–3.0)
EOS PCT: 6 % — AB (ref 0.0–5.0)
Eosinophils Absolute: 0.3 10*3/uL (ref 0.0–0.7)
HEMATOCRIT: 28.8 % — AB (ref 39.0–52.0)
Hemoglobin: 9.5 g/dL — ABNORMAL LOW (ref 13.0–17.0)
Lymphocytes Relative: 23.9 % (ref 12.0–46.0)
Lymphs Abs: 1.1 10*3/uL (ref 0.7–4.0)
MCHC: 33.1 g/dL (ref 30.0–36.0)
MCV: 87.7 fl (ref 78.0–100.0)
MONO ABS: 0.6 10*3/uL (ref 0.1–1.0)
Monocytes Relative: 13.2 % — ABNORMAL HIGH (ref 3.0–12.0)
Neutro Abs: 2.6 10*3/uL (ref 1.4–7.7)
Neutrophils Relative %: 55.3 % (ref 43.0–77.0)
PLATELETS: 292 10*3/uL (ref 150.0–400.0)
RBC: 3.29 Mil/uL — ABNORMAL LOW (ref 4.22–5.81)
RDW: 14.4 % (ref 11.5–15.5)
WBC: 4.8 10*3/uL (ref 4.0–10.5)

## 2018-10-07 LAB — BASIC METABOLIC PANEL
BUN: 15 mg/dL (ref 6–23)
CO2: 28 mEq/L (ref 19–32)
Calcium: 9.1 mg/dL (ref 8.4–10.5)
Chloride: 101 mEq/L (ref 96–112)
Creatinine, Ser: 1.22 mg/dL (ref 0.40–1.50)
GFR: 57.52 mL/min — ABNORMAL LOW (ref >60.00)
Glucose, Bld: 184 mg/dL — ABNORMAL HIGH (ref 70–99)
Potassium: 5.5 mEq/L — ABNORMAL HIGH (ref 3.5–5.1)
Sodium: 137 mEq/L (ref 135–145)

## 2018-10-07 NOTE — Progress Notes (Signed)
Subjective:    Patient ID: Charles [name], male    DOB: [birth year], 78 y.o.   MRN: [id number]  HPI  78 year old male who  has a past medical history of Anemia, Arthritis, AVM (arteriovenous malformation) of colon, Biceps muscle tear, Cancer of kidney ([place]) (2000), Coronary artery disease, Eczema, GERD (gastroesophageal reflux disease), Hernia, ventral, Hyperlipidemia, Hypertension, Obesity, Personal history of adenomatous colonic polyps (07/20/2012), Pneumonia (1942; 1950s X 1), Type II diabetes mellitus ([place]), and Walking pneumonia (2000's X 1).  He presents to the office today for TCM visit   Admit Date 09/28/2018 Discharge Date: 09/29/2018  Presented to the hospital for evaluation of nosebleed. Patient had had dental implants removed from his upper jaw earlier that day and since removals had experienced bleeding from his right nare.  While in the emergency room he felt nauseous and vomited blood.  He felt dizzy while standing up.  He is on a daily 81 mg aspirin.  Was kept overnight for observation.  Hospital note nasal bleeding had stopped with Rhino nasal device.  H&H remained stable.  ENT recommended keeping nasal device in place for 72 hours, use saline nose sprays to prevent the device from drying, Keflex for prophylactics and follow-up with ENT in 2 to 3 days  Hospital Course  1. Epistaxis following dental procedure -So facial CT showing smooth osseous defect within the right maxilla with dense material in the region, possibly representing bone graft material extending into the base of the right nasal cavity.  CT also showed multiple small bony fragments anterior to the presumed surgical site, this was thought to represent small fracture fragments.,  ENT recommended keeping Rhino nasal device for 72 hours and to follow-up in clinic in 2 to 3 days.  Aspirin was discontinued on discharge. His Hemoglobin dropped from his baseline of 13 to 9.4 during hospital stay  2. Mild AKI -improved in  the hospital.  Recommended repeat BMP on follow-up  3. Parotid gland abnormality -CT showing mildly dense masses at the inferior aspect of the parotid glands, possibly representing enlarged nodes or other solid tissue masses.  It was recommended a short interval 6-week ultrasound or contrast-enhanced neck CT for follow-up  Today in the office he reports that he has had his rhino device removed and no longer has had any acute bleeding. He continues to feel sore in the right nasal cavity and maxillary sinus cavity. He also reports that he continues to feel weak, tired, and becomes short of breath with exercising.   Review of Systems  Constitutional: Positive for activity change and fatigue.  HENT: Negative.   Respiratory: Positive for shortness of breath.   Cardiovascular: Negative.   Gastrointestinal: Negative.   Genitourinary: Negative.   Musculoskeletal: Negative.   Skin: Positive for pallor.  Neurological: Positive for weakness.  Hematological: Negative.   Psychiatric/Behavioral: Negative.   All other systems reviewed and are negative.  Past Medical History:  Diagnosis Date  . Anemia    "when I was a lad"  . Arthritis    "right femur" (10/19/2015)  . AVM (arteriovenous malformation) of colon    2 - non-bleeding 2013  . Biceps muscle tear    right  . Cancer of kidney ([place]) 2000   left nephrectomy  . Coronary artery disease   . Eczema   . GERD (gastroesophageal reflux disease)   . Hernia, ventral   . Hyperlipidemia   . Hypertension   . Obesity   . Personal history of adenomatous  colonic polyps 07/20/2012   3 + adenomas 2009 07/20/2012 2 diminutive polyps    . Pneumonia 1942; 1950s X 1  . Type II diabetes mellitus ([place])   . Walking pneumonia 2000's X 1    Social History   Socioeconomic History  . Marital status: Widowed    Spouse name: Not on file  . Number of children: Not on file  . Years of education: Not on file  . Highest education level: Not on file    Occupational History  . Not on file  Social Needs  . Financial resource strain: Not on file  . Food insecurity:    Worry: Not on file    Inability: Not on file  . Transportation needs:    Medical: Not on file    Non-medical: Not on file  Tobacco Use  . Smoking status: Former Smoker    Years: 20.00    Types: Cigarettes, Pipe, Cigars    Last attempt to quit: 09/09/1968    Years since quitting: 50.1  . Smokeless tobacco: Never Used  . Tobacco comment: smoking cigars- quit in 13   Substance and Sexual Activity  . Alcohol use: Yes    Alcohol/week: 1.0 standard drinks    Types: 1 Cans of beer per week    Comment: occasionally   . Drug use: No  . Sexual activity: Never  Lifestyle  . Physical activity:    Days per week: Not on file    Minutes per session: Not on file  . Stress: Not on file  Relationships  . Social connections:    Talks on phone: Not on file    Gets together: Not on file    Attends religious service: Not on file    Active member of club or organization: Not on file    Attends meetings of clubs or organizations: Not on file    Relationship status: Not on file  . Intimate partner violence:    Fear of current or ex partner: Not on file    Emotionally abused: Not on file    Physically abused: Not on file    Forced sexual activity: Not on file  Other Topics Concern  . 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    Not married- widowed    Three children, One in [place], one in [place] one in [place]    Past Surgical History:  Procedure Laterality Date  . CARDIAC CATHETERIZATION N/A 10/19/2015   Procedure: Left Heart Cath and Coronary Angiography;  Surgeon: Charolette Forward, MD;  Location: [place] [facility];  Service: Cardiovascular;  Laterality: N/A;  . CARDIAC CATHETERIZATION N/A 10/19/2015   Procedure: Coronary Stent Intervention;  Surgeon: Charolette Forward, MD;  Location: [place] [facility];  Service: Cardiovascular;  Laterality: N/A;  . CATARACT EXTRACTION, BILATERAL Bilateral 01/2018   Dr. Kathrin Penner did surgery and he developed MD and is seeing specialist now for MD treatment  . COLONOSCOPY     "nothing showed up this time"  . COLONOSCOPY W/ BIOPSIES AND POLYPECTOMY  X 1  . CORONARY ANGIOPLASTY    . FEMUR IM NAIL Right 07/26/2013   Procedure: INTRAMEDULLARY (IM) NAIL FEMORAL subtrochanteric;  Surgeon: Mauri Pole, MD;  Location: [facility];  Service: Orthopedics;  Laterality: Right;  . FRACTURE SURGERY    . INGUINAL HERNIA REPAIR Left >3 times  .  INGUINAL HERNIA REPAIR Right 2012  . NEPHRECTOMY Left 2000  . TONSILLECTOMY  1940s    Family History  Problem Relation Age of Onset  . Drug abuse Other   . Cancer Other   . Heart disease Other   . Lung disease Other   . Diabetes Mother   . Diabetes Sister   . Colon cancer Neg Hx   . Stomach cancer Neg Hx     No Known Allergies  Current Outpatient Medications on File Prior to Visit  Medication Sig Dispense Refill  . atorvastatin (LIPITOR) 40 MG tablet Take 1 tablet (40 mg total) by mouth daily at 6 PM. 30 tablet 3  . Bioflavonoid Products (VITAMIN C) CHEW Chew 1 tablet by mouth daily.     . cyanocobalamin 1000 MCG tablet Take 5,000 mcg by mouth every other day.     Marland Kitchen EPINEPHrine (EPIPEN 2-PAK) 0.3 mg/0.3 mL DEVI Inject 0.3 mg into the muscle daily as needed (allergic reaction).     Marland Kitchen glipiZIDE (GLUCOTROL) 5 MG tablet TAKE 1 TABLET BY MOUTH 2  TIMES DAILY BEFORE A MEAL (Patient taking differently: Take 2.5 mg by mouth 2 (two) times daily before a meal. ) 180 tablet 3  . lisinopril (PRINIVIL,ZESTRIL) 5 MG tablet Take 1 tablet (5 mg total) by mouth daily. 30 tablet 3  . metFORMIN (GLUCOPHAGE) 1000 MG tablet Take 1 tablet by mouth two  times daily with meals 360 tablet 3  . metoprolol succinate (TOPROL-XL) 50 MG 24 hr tablet Take 50 mg by mouth daily.     . Multiple  Vitamins-Minerals (ICAPS PO) Take 1 tablet by mouth daily.    Marland Kitchen neomycin-polymyxin-dexameth (MAXITROL) 0.1 % OINT Place 1 application into the left eye 2 (two) times daily.    . nitroGLYCERIN (NITROSTAT) 0.4 MG SL tablet Place 1 tablet (0.4 mg total) under the tongue every 5 (five) minutes as needed for chest pain. 25 tablet 1  . ONETOUCH DELICA LANCETS FINE MISC Use to check sugar daily 100 each 5  . ONETOUCH VERIO test strip USE 1 STRIP TO CHECK GLUCOSE ONCE DAILY 50 each 11  . sodium chloride (OCEAN) 0.65 % SOLN nasal spray Place 2 sprays into both nostrils 3 (three) times daily. 1 Bottle 0  . vitamin E 400 UNIT capsule Take 400 Units by mouth daily.     No current facility-administered medications on file prior to visit.     BP 134/82   Temp 97.8 F (36.6 C)   Wt 255 lb (115.7 kg)   BMI 34.58 kg/m       Objective:   Physical Exam Vitals signs and nursing note reviewed.  Constitutional:      Appearance: Normal appearance.  HENT:     Nose: Nose normal. No nasal deformity, signs of injury, laceration, nasal tenderness, congestion or rhinorrhea.     Right Turbinates: Not enlarged or swollen.     Left Turbinates: Not enlarged or swollen.     Mouth/Throat:     Mouth: Mucous membranes are moist. Mucous membranes are pale.     Pharynx: Oropharynx is clear.  Neck:     Musculoskeletal: Normal range of motion and neck supple.     Comments: No parotid mass felt  Cardiovascular:     Rate and Rhythm: Normal rate and regular rhythm.     Pulses: Normal pulses.     Heart sounds: Normal heart sounds. No murmur. No friction rub. No gallop.   Pulmonary:     Effort:  Pulmonary effort is normal. No respiratory distress.     Breath sounds: Normal breath sounds. No stridor. No wheezing, rhonchi or rales.  Chest:     Chest wall: No tenderness.  Abdominal:     General: Abdomen is flat. Bowel sounds are normal.     Palpations: Abdomen is soft.  Musculoskeletal: Normal range of motion.   Skin:    General: Skin is warm and dry.     Capillary Refill: Capillary refill takes less than 2 seconds.     Coloration: Skin is pale.  Neurological:     General: No focal deficit present.     Mental Status: He is alert and oriented to person, place, and time.  Psychiatric:        Mood and Affect: Mood normal.        Behavior: Behavior normal.        Thought Content: Thought content normal.        Judgment: Judgment normal.       Assessment & Plan:  1. Acute blood loss anemia  - CBC with Differential/Platelet - Basic Metabolic Panel - Iron, TIBC and Ferritin Panel  2. Parotid mass  - CT Soft Tissue Neck W Contrast; Future  3. Other fatigue - likely contributing cause is low hemoglobin. He can restart iron supplement  - Follow up as needed - CBC with Differential/Platelet - Basic Metabolic Panel - Iron, TIBC and Ferritin Panel  [name], NP

## 2018-10-08 LAB — IRON,TIBC AND FERRITIN PANEL
%SAT: 19 % — AB (ref 20–48)
Ferritin: 16 ng/mL — ABNORMAL LOW (ref 24–380)
Iron: 72 ug/dL (ref 50–180)
TIBC: 374 mcg/dL (calc) (ref 250–425)

## 2018-10-12 ENCOUNTER — Ambulatory Visit (INDEPENDENT_AMBULATORY_CARE_PROVIDER_SITE_OTHER)
Admission: RE | Admit: 2018-10-12 | Discharge: 2018-10-12 | Disposition: A | Discharge status: Home / Self Care | Payer: Medicare Other | Source: Ambulatory Visit | Attending: Adult Health | Admitting: Adult Health

## 2018-10-12 DIAGNOSIS — K118 Other diseases of salivary glands: Secondary | ICD-10-CM

## 2018-10-12 MED ORDER — IOPAMIDOL (ISOVUE-300) INJECTION 61%
75.0000 mL | Freq: Once | INTRAVENOUS | Status: AC | PRN
  Administered 2018-10-12: 75 mL via INTRAVENOUS

## 2018-10-13 NOTE — Telephone Encounter (Signed)
Updated patient on results of CT scan   Will send to Dr. Redmond Baseman with [facility] for biopsy of parotid gland mass

## 2018-10-16 ENCOUNTER — Other Ambulatory Visit: Payer: Self-pay | Admitting: Adult Health

## 2018-10-16 ENCOUNTER — Encounter: Payer: Self-pay | Admitting: Adult Health

## 2018-10-16 DIAGNOSIS — D490 Neoplasm of unspecified behavior of digestive system: Secondary | ICD-10-CM

## 2018-10-26 ENCOUNTER — Other Ambulatory Visit (INDEPENDENT_AMBULATORY_CARE_PROVIDER_SITE_OTHER): Payer: Medicare Other

## 2018-10-26 DIAGNOSIS — D62 Acute posthemorrhagic anemia: Secondary | ICD-10-CM

## 2018-10-26 DIAGNOSIS — I10 Essential (primary) hypertension: Secondary | ICD-10-CM | POA: Diagnosis not present

## 2018-10-26 LAB — BASIC METABOLIC PANEL
BUN: 21 mg/dL (ref 6–23)
CHLORIDE: 103 meq/L (ref 96–112)
CO2: 28 mEq/L (ref 19–32)
Calcium: 8.7 mg/dL (ref 8.4–10.5)
Creatinine, Ser: 1.21 mg/dL (ref 0.40–1.50)
GFR: 58.06 mL/min — AB (ref >60.00)
Glucose, Bld: 250 mg/dL — ABNORMAL HIGH (ref 70–99)
POTASSIUM: 5.2 meq/L — AB (ref 3.5–5.1)
Sodium: 138 mEq/L (ref 135–145)

## 2018-10-26 LAB — CBC WITH DIFFERENTIAL/PLATELET
BASOS PCT: 2 % (ref 0.0–3.0)
Basophils Absolute: 0.1 10*3/uL (ref 0.0–0.1)
Eosinophils Absolute: 0.2 10*3/uL (ref 0.0–0.7)
Eosinophils Relative: 6.7 % — ABNORMAL HIGH (ref 0.0–5.0)
HCT: 31.6 % — ABNORMAL LOW (ref 39.0–52.0)
HEMOGLOBIN: 10.2 g/dL — AB (ref 13.0–17.0)
Lymphocytes Relative: 24.4 % (ref 12.0–46.0)
Lymphs Abs: 0.8 10*3/uL (ref 0.7–4.0)
MCHC: 32.3 g/dL (ref 30.0–36.0)
MCV: 88.1 fl (ref 78.0–100.0)
Monocytes Absolute: 0.4 10*3/uL (ref 0.1–1.0)
Monocytes Relative: 13.8 % — ABNORMAL HIGH (ref 3.0–12.0)
Neutro Abs: 1.7 10*3/uL (ref 1.4–7.7)
Neutrophils Relative %: 53.1 % (ref 43.0–77.0)
Platelets: 207 10*3/uL (ref 150.0–400.0)
RBC: 3.58 Mil/uL — ABNORMAL LOW (ref 4.22–5.81)
RDW: 15.8 % — ABNORMAL HIGH (ref 11.5–15.5)
WBC: 3.1 10*3/uL — AB (ref 4.0–10.5)

## 2018-10-26 LAB — IBC + FERRITIN
IRON: 106 ug/dL (ref 42–165)
Saturation Ratios: 25.4 % (ref 20.0–50.0)
Transferrin: 298 mg/dL (ref 212.0–360.0)

## 2018-11-23 LAB — HM DIABETES EYE EXAM

## 2018-12-12 ENCOUNTER — Other Ambulatory Visit: Payer: Self-pay | Admitting: Internal Medicine

## 2018-12-22 ENCOUNTER — Ambulatory Visit (INDEPENDENT_AMBULATORY_CARE_PROVIDER_SITE_OTHER): Payer: Medicare Other | Admitting: Adult Health

## 2018-12-22 ENCOUNTER — Encounter: Payer: Self-pay | Admitting: Adult Health

## 2018-12-22 ENCOUNTER — Other Ambulatory Visit: Payer: Self-pay

## 2018-12-22 VITALS — BP 120/84 | HR 67 | Temp 98.0°F | Wt 252.1 lb

## 2018-12-22 DIAGNOSIS — S30861A Insect bite (nonvenomous) of abdominal wall, initial encounter: Secondary | ICD-10-CM | POA: Diagnosis not present

## 2018-12-22 DIAGNOSIS — W57XXXA Bitten or stung by nonvenomous insect and other nonvenomous arthropods, initial encounter: Secondary | ICD-10-CM

## 2018-12-22 DIAGNOSIS — L237 Allergic contact dermatitis due to plants, except food: Secondary | ICD-10-CM | POA: Diagnosis not present

## 2018-12-22 MED ORDER — DOXYCYCLINE HYCLATE 100 MG PO CAPS: 200.0000 mg | ORAL_CAPSULE | Freq: Once | ORAL | 0 refills | Status: AC

## 2018-12-22 NOTE — Progress Notes (Signed)
Subjective:    Patient ID: Charles [name], male    DOB: [birth year], 78 y.o.   MRN: [id number]  HPI 78 year old male who  has a past medical history of Anemia, Arthritis, AVM (arteriovenous malformation) of colon, Biceps muscle tear, Cancer of kidney ([place]) (2000), Coronary artery disease, Eczema, GERD (gastroesophageal reflux disease), Hernia, ventral, Hyperlipidemia, Hypertension, Obesity, Personal history of adenomatous colonic polyps (07/20/2012), Pneumonia (1942; 1950s X 1), Type II diabetes mellitus ([place]), and Walking pneumonia (2000's X 1).  He presents to the office today for tick bite to right flank. He believes the tick was attached for "2 weeks".  His daughter was able to remove the tick but he would like to make sure it was completely removed.  Does report some itching but has not noticed a bull's-eye rash.  He denies fevers, chills, or body aches.  Reports that he believes he has a poison ivy rash on the back of the right knee.  Does report itching has been placing calamine lotion on this area with some improvement   Review of Systems See HPI   Past Medical History:  Diagnosis Date  . Anemia    "when I was a lad"  . Arthritis    "right femur" (10/19/2015)  . AVM (arteriovenous malformation) of colon    2 - non-bleeding 2013  . Biceps muscle tear    right  . Cancer of kidney ([place]) 2000   left nephrectomy  . Coronary artery disease   . Eczema   . GERD (gastroesophageal reflux disease)   . Hernia, ventral   . Hyperlipidemia   . Hypertension   . Obesity   . Personal history of adenomatous colonic polyps 07/20/2012   3 + adenomas 2009 07/20/2012 2 diminutive polyps    . Pneumonia 1942; 1950s X 1  . Type II diabetes mellitus ([place])   . Walking pneumonia 2000's X 1    Social History   Socioeconomic History  . Marital status: Widowed    Spouse name: Not on file  . Number of children: Not on file  . Years of education: Not on file  . Highest education level: Not on  file  Occupational History  . Not on file  Social Needs  . Financial resource strain: Not on file  . Food insecurity:    Worry: Not on file    Inability: Not on file  . Transportation needs:    Medical: Not on file    Non-medical: Not on file  Tobacco Use  . Smoking status: Former Smoker    Years: 20.00    Types: Cigarettes, Pipe, Cigars    Last attempt to quit: 09/09/1968    Years since quitting: 50.3  . Smokeless tobacco: Never Used  . Tobacco comment: smoking cigars- quit in 13   Substance and Sexual Activity  . Alcohol use: Yes    Alcohol/week: 1.0 standard drinks    Types: 1 Cans of beer per week    Comment: occasionally   . Drug use: No  . Sexual activity: Never  Lifestyle  . Physical activity:    Days per week: Not on file    Minutes per session: Not on file  . Stress: Not on file  Relationships  . Social connections:    Talks on phone: Not on file    Gets together: Not on file    Attends religious service: Not on file    Active member of club or organization: Not on file  Attends meetings of clubs or organizations: Not on file    Relationship status: Not on file  . Intimate partner violence:    Fear of current or ex partner: Not on file    Emotionally abused: Not on file    Physically abused: Not on file    Forced sexual activity: Not on file  Other Topics Concern  . 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    Not married- widowed    Three children, One in [place], one in [place] one in [place]    Past Surgical History:  Procedure Laterality Date  . CARDIAC CATHETERIZATION N/A 10/19/2015   Procedure: Left Heart Cath and Coronary Angiography;  Surgeon: Charolette Forward, MD;  Location: [place] [facility];  Service: Cardiovascular;  Laterality: N/A;  . CARDIAC CATHETERIZATION N/A 10/19/2015   Procedure: Coronary Stent Intervention;  Surgeon:  Charolette Forward, MD;  Location: [place] [facility];  Service: Cardiovascular;  Laterality: N/A;  . CATARACT EXTRACTION, BILATERAL Bilateral 01/2018   Dr. Kathrin Penner did surgery and he developed MD and is seeing specialist now for MD treatment  . COLONOSCOPY     "nothing showed up this time"  . COLONOSCOPY W/ BIOPSIES AND POLYPECTOMY  X 1  . CORONARY ANGIOPLASTY    . FEMUR IM NAIL Right 07/26/2013   Procedure: INTRAMEDULLARY (IM) NAIL FEMORAL subtrochanteric;  Surgeon: Mauri Pole, MD;  Location: [facility];  Service: Orthopedics;  Laterality: Right;  . FRACTURE SURGERY    . INGUINAL HERNIA REPAIR Left >3 times  . INGUINAL HERNIA REPAIR Right 2012  . NEPHRECTOMY Left 2000  . TONSILLECTOMY  1940s    Family History  Problem Relation Age of Onset  . Drug abuse Other   . Cancer Other   . Heart disease Other   . Lung disease Other   . Diabetes Mother   . Diabetes Sister   . Colon cancer Neg Hx   . Stomach cancer Neg Hx     No Known Allergies  Current Outpatient Medications on File Prior to Visit  Medication Sig Dispense Refill  . atorvastatin (LIPITOR) 40 MG tablet Take 1 tablet (40 mg total) by mouth daily at 6 PM. 30 tablet 3  . Bioflavonoid Products (VITAMIN C) CHEW Chew 1 tablet by mouth daily.     . cyanocobalamin 1000 MCG tablet Take 5,000 mcg by mouth every other day.     Marland Kitchen EPINEPHrine (EPIPEN 2-PAK) 0.3 mg/0.3 mL DEVI Inject 0.3 mg into the muscle daily as needed (allergic reaction).     Marland Kitchen glipiZIDE (GLUCOTROL) 5 MG tablet TAKE 1 TABLET BY MOUTH 2  TIMES DAILY BEFORE A MEAL (Patient taking differently: Take 2.5 mg by mouth 2 (two) times daily before a meal. ) 180 tablet 3  . lisinopril (PRINIVIL,ZESTRIL) 5 MG tablet Take 1 tablet (5 mg total) by mouth daily. 30 tablet 3  . metFORMIN (GLUCOPHAGE) 1000 MG tablet TAKE 1 TABLET BY MOUTH TWO  TIMES DAILY WITH MEALS 180 tablet 1  . metoprolol succinate (TOPROL-XL) 50 MG 24 hr tablet Take 50 mg by mouth daily.     . Multiple  Vitamins-Minerals (ICAPS PO) Take 1 tablet by mouth daily.    Marland Kitchen neomycin-polymyxin-dexameth (MAXITROL) 0.1 % OINT Place 1 application into the left eye 2 (two) times daily.    . nitroGLYCERIN (NITROSTAT) 0.4 MG SL tablet Place  1 tablet (0.4 mg total) under the tongue every 5 (five) minutes as needed for chest pain. 25 tablet 1  . ONETOUCH DELICA LANCETS FINE MISC Use to check sugar daily 100 each 5  . ONETOUCH VERIO test strip USE 1 STRIP TO CHECK GLUCOSE ONCE DAILY 50 each 11  . sodium chloride (OCEAN) 0.65 % SOLN nasal spray Place 2 sprays into both nostrils 3 (three) times daily. 1 Bottle 0  . vitamin E 400 UNIT capsule Take 400 Units by mouth daily.     No current facility-administered medications on file prior to visit.     BP 120/84 (BP Location: Right Arm, Patient Position: Sitting, Cuff Size: Large)   Pulse 67   Temp 98 F (36.7 C) (Oral)   Wt 252 lb 1.6 oz (114.4 kg)   SpO2 97%   BMI 34.19 kg/m       Objective:   Physical Exam Vitals signs and nursing note reviewed.  Constitutional:      Appearance: Normal appearance.  Skin:    General: Skin is warm and dry.     Capillary Refill: Capillary refill takes less than 2 seconds.     Comments: Insect bite noticed on right flank, localized erythema.  No bull's-eye rash noted.  No signs of infection noted. No tick noted   Does have small amount of poison ivy appearing rash on the back of his right knee.  No drainage or vesicles noted  Neurological:     General: No focal deficit present.     Mental Status: He is alert.  Psychiatric:        Mood and Affect: Mood normal.        Behavior: Behavior normal.        Thought Content: Thought content normal.        Judgment: Judgment normal.        Assessment & Plan:  1. Tick bite of abdomen, initial encounter - No signs of tick borne illness.  Cover with single dose of doxycycline due to time frame that was attached - doxycycline (VIBRAMYCIN) 100 MG capsule; Take 2 capsules  (200 mg total) by mouth once for 1 dose.  Dispense: 1 capsule; Refill: 0  2. Poison ivy -Vies over-the-counter hydrocortisone cream, apply twice daily PRN   [name], NP

## 2018-12-24 ENCOUNTER — Encounter: Payer: Self-pay | Admitting: Internal Medicine

## 2018-12-24 ENCOUNTER — Ambulatory Visit: Payer: Medicare Other | Admitting: Internal Medicine

## 2018-12-24 ENCOUNTER — Other Ambulatory Visit: Payer: Self-pay

## 2018-12-24 VITALS — BP 120/78 | HR 80 | Temp 98.7°F | Ht 70.5 in | Wt 251.0 lb

## 2018-12-24 DIAGNOSIS — G6289 Other specified polyneuropathies: Secondary | ICD-10-CM

## 2018-12-24 DIAGNOSIS — E785 Hyperlipidemia, unspecified: Secondary | ICD-10-CM | POA: Diagnosis not present

## 2018-12-24 DIAGNOSIS — E1165 Type 2 diabetes mellitus with hyperglycemia: Secondary | ICD-10-CM

## 2018-12-24 DIAGNOSIS — E1159 Type 2 diabetes mellitus with other circulatory complications: Secondary | ICD-10-CM

## 2018-12-24 LAB — POCT GLYCOSYLATED HEMOGLOBIN (HGB A1C): Hemoglobin A1C: 7.8 % — AB (ref 4.0–5.6)

## 2018-12-24 MED ORDER — SEMAGLUTIDE 3 MG PO TABS: 1.0000 | ORAL_TABLET | Freq: Every day | ORAL | 1 refills | Status: DC

## 2018-12-24 NOTE — Addendum Note (Signed)
Addended by: Cardell Peach I on: 12/24/2018 09:51 AM   Modules accepted: Orders

## 2018-12-24 NOTE — Patient Instructions (Addendum)
Please  continue: - Metformin 1000 mg 2x a day - Glipizide 5 mg 2x a day before meals  Start: - Rybelsus 3 mg before b'fast x 2 weeks, then increase to 6 mg daily  Let me know how you do with the 6 mg so I can call in the higher dose of the medication.  Please return in 3-4 months with your sugar log.

## 2018-12-24 NOTE — Progress Notes (Signed)
Patient ID: Charles [name], male   DOB: 1941-06-26, 78 y.o.   MRN: [id number]  HPI: Charles [name] is a 78 y.o.-year-old male, returning for f/u for DM2 dx 2008, non-insulin-dependent, uncontrolled, with complications (CAD - s/p NSTEMI, PN, mild CKD). Last visit 3 months ago.  He started to change his diet in 11/2017: Decreased sweets, fatty foods, not eating between meals.  Sugars improved afterwards, but they were higher at last visit, after the holidays.  He now has some dietary indiscretions at night and also occasionally forgets his nighttime medication doses with subsequent high blood sugars in the morning.  DM2: Last hemoglobin A1c was: Lab Results  Component Value Date   HGBA1C 7.9 (H) 09/29/2018   HGBA1C 7.1 (A) 08/24/2018   HGBA1C 7.0 (A) 05/04/2018  12/2013: 8.6%  Pt is on a regimen of: - Metformin 1000 mg 2x a day - Glipizide 2.5 >> 5 mg 2x a day before meals He checked with his insurance and none of the SGLT2 inhibitors are covered for him.  Pt checks his sugars 1x a day: - am:  113-170, 195 >> 119-188, 259 >> 112, 129-167, 185 - 2h after b'fast: 68x1 >> n/c >> 201, 206 >> n/c - before lunch:  n/c >> 69, 79 >> n/c - 2h after lunch: 123-176 >> n/c - before dinner  n/c >> 160s >> n/c - 2h after dinner: n/c >> 240 >> n/c - bedtime: 110-150 >> 160s >> n/c Lowest sugar was  119 >> 90s; he has hypoglycemia awareness in the 80s. Highest sugar was 259 >> 200s (missed medication).  -+ Mild CKD, last BUN/creatinine:  Lab Results  Component Value Date   BUN 21 10/26/2018   CREATININE 1.21 10/26/2018  Of note, he has a history of nephrectomy. On lisinopril 10.  -+ HL; last set of lipids: Lab Results  Component Value Date   CHOL 136 08/19/2018   HDL 45.20 08/19/2018   LDLCALC 52 08/19/2018   LDLDIRECT 139.8 07/08/2014   TRIG 193.0 (H) 08/19/2018   CHOLHDL 3 08/19/2018  On Lipitor 40. - last eye exam was in 11/2018: No DR; now seeing Dr. [place] - retinal vein occlusion, +  cataracts, + macular "hole".  He has a history of eye surgeries. He gets IO inj's q 6 weeks. - Improved numbness and tingling in his feet   B12 deficiency:  He was initially on B12 injections, then switched to 5000 mcg currently taking this every other day Lab Results  Component Value Date   VITAMINB12 1,172 (H) 08/19/2018   VITAMINB12 >1500 (H) 05/04/2018   VITAMINB12 1,249 (H) 02/18/2017   VITAMINB12 234 10/22/2016   VITAMINB12 217 04/19/2016   VITAMINB12 94 (L) 10/13/2015   He was admitted with NSTEMI 10/18/2015.  He had stents placed then.  He was going to the gym 5 out of 7 days, now stopped due to coronavirus pandemic.  ROS: Constitutional: no weight gain/no weight loss, no fatigue, no subjective hyperthermia, no subjective hypothermia Eyes: no blurry vision, no xerophthalmia ENT: no sore throat, no nodules palpated in neck, no dysphagia, no odynophagia, no hoarseness Cardiovascular: no CP/no SOB/no palpitations/no leg swelling Respiratory: no cough/no SOB/no wheezing Gastrointestinal: no N/no V/no D/no C/no acid reflux Musculoskeletal: no muscle aches/no joint aches Skin: no rashes, no hair loss Neurological: no tremors/no numbness/no tingling/no dizziness  I reviewed pt's medications, allergies, PMH, social hx, family hx, and changes were documented in the history of present illness. Otherwise, unchanged from my initial visit note.  Past Medical History:  Diagnosis Date  . Anemia    "when I was a lad"  . Arthritis    "right femur" (10/19/2015)  . AVM (arteriovenous malformation) of colon    2 - non-bleeding 2013  . Biceps muscle tear    right  . Cancer of kidney ([place]) 2000   left nephrectomy  . Coronary artery disease   . Eczema   . GERD (gastroesophageal reflux disease)   . Hernia, ventral   . Hyperlipidemia   . Hypertension   . Obesity   . Personal history of adenomatous colonic polyps 07/20/2012   3 + adenomas 2009 07/20/2012 2 diminutive polyps    .  Pneumonia 1942; 1950s X 1  . Type II diabetes mellitus ([place])   . Walking pneumonia 2000's X 1   Past Surgical History:  Procedure Laterality Date  . CARDIAC CATHETERIZATION N/A 10/19/2015   Procedure: Left Heart Cath and Coronary Angiography;  Surgeon: Charolette Forward, MD;  Location: [facility];  Service: Cardiovascular;  Laterality: N/A;  . CARDIAC CATHETERIZATION N/A 10/19/2015   Procedure: Coronary Stent Intervention;  Surgeon: Charolette Forward, MD;  Location: [facility];  Service: Cardiovascular;  Laterality: N/A;  . CATARACT EXTRACTION, BILATERAL Bilateral 01/2018   Dr. Kathrin Penner did surgery and he developed MD and is seeing specialist now for MD treatment  . COLONOSCOPY     "nothing showed up this time"  . COLONOSCOPY W/ BIOPSIES AND POLYPECTOMY  X 1  . CORONARY ANGIOPLASTY    . FEMUR IM NAIL Right 07/26/2013   Procedure: INTRAMEDULLARY (IM) NAIL FEMORAL subtrochanteric;  Surgeon: Mauri Pole, MD;  Location: [facility];  Service: Orthopedics;  Laterality: Right;  . FRACTURE SURGERY    . INGUINAL HERNIA REPAIR Left >3 times  . INGUINAL HERNIA REPAIR Right 2012  . NEPHRECTOMY Left 2000  . TONSILLECTOMY  1940s   Social History   Socioeconomic History  . Marital status: Widowed    Spouse name: Not on file  . Number of children: Not on file  . Years of education: Not on file  . Highest education level: Not on file  Occupational History  . Not on file  Social Needs  . Financial resource strain: Not on file  . Food insecurity:    Worry: Not on file    Inability: Not on file  . Transportation needs:    Medical: Not on file    Non-medical: Not on file  Tobacco Use  . Smoking status: Former Smoker    Years: 20.00    Types: Cigarettes, Pipe, Cigars    Last attempt to quit: 09/09/1968    Years since quitting: 50.3  . Smokeless tobacco: Never Used  . Tobacco comment: smoking cigars- quit in 13   Substance and Sexual Activity  . Alcohol use: Yes    Alcohol/week: 1.0  standard drinks    Types: 1 Cans of beer per week    Comment: occasionally   . Drug use: No  . Sexual activity: Never  Lifestyle  . Physical activity:    Days per week: Not on file    Minutes per session: Not on file  . Stress: Not on file  Relationships  . Social connections:    Talks on phone: Not on file    Gets together: Not on file    Attends religious service: Not on file    Active member of club or organization: Not on file    Attends meetings of clubs  or organizations: Not on file    Relationship status: Not on file  . Intimate partner violence:    Fear of current or ex partner: Not on file    Emotionally abused: Not on file    Physically abused: Not on file    Forced sexual activity: Not on file  Other Topics Concern  . 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    Not married- widowed    Three children, One in [place], one in [place] one in [place]   Current Outpatient Medications on File Prior to Visit  Medication Sig Dispense Refill  . atorvastatin (LIPITOR) 40 MG tablet Take 1 tablet (40 mg total) by mouth daily at 6 PM. 30 tablet 3  . Bioflavonoid Products (VITAMIN C) CHEW Chew 1 tablet by mouth daily.     . cyanocobalamin 1000 MCG tablet Take 5,000 mcg by mouth every other day.     Marland Kitchen EPINEPHrine (EPIPEN 2-PAK) 0.3 mg/0.3 mL DEVI Inject 0.3 mg into the muscle daily as needed (allergic reaction).     Marland Kitchen glipiZIDE (GLUCOTROL) 5 MG tablet TAKE 1 TABLET BY MOUTH 2  TIMES DAILY BEFORE A MEAL (Patient taking differently: Take 2.5 mg by mouth 2 (two) times daily before a meal. ) 180 tablet 3  . lisinopril (PRINIVIL,ZESTRIL) 5 MG tablet Take 1 tablet (5 mg total) by mouth daily. 30 tablet 3  . metFORMIN (GLUCOPHAGE) 1000 MG tablet TAKE 1 TABLET BY MOUTH TWO  TIMES DAILY WITH MEALS 180 tablet 1  . metoprolol succinate (TOPROL-XL) 50 MG 24 hr tablet Take 50  mg by mouth daily.     . Multiple Vitamins-Minerals (ICAPS PO) Take 1 tablet by mouth daily.    Marland Kitchen neomycin-polymyxin-dexameth (MAXITROL) 0.1 % OINT Place 1 application into the left eye 2 (two) times daily.    . nitroGLYCERIN (NITROSTAT) 0.4 MG SL tablet Place 1 tablet (0.4 mg total) under the tongue every 5 (five) minutes as needed for chest pain. 25 tablet 1  . ONETOUCH DELICA LANCETS FINE MISC Use to check sugar daily 100 each 5  . ONETOUCH VERIO test strip USE 1 STRIP TO CHECK GLUCOSE ONCE DAILY 50 each 11  . sodium chloride (OCEAN) 0.65 % SOLN nasal spray Place 2 sprays into both nostrils 3 (three) times daily. 1 Bottle 0  . vitamin E 400 UNIT capsule Take 400 Units by mouth daily.     No current facility-administered medications on file prior to visit.    No Known Allergies Family History  Problem Relation Age of Onset  . Drug abuse Other   . Cancer Other   . Heart disease Other   . Lung disease Other   . Diabetes Mother   . Diabetes Sister   . Colon cancer Neg Hx   . Stomach cancer Neg Hx     PE: There were no vitals taken for this visit. Wt Readings from Last 3 Encounters:  12/22/18 252 lb 1.6 oz (114.4 kg)  10/07/18 255 lb (115.7 kg)  09/29/18 247 lb 3.2 oz (112.1 kg)   Constitutional: overweight, in NAD Eyes: PERRLA, EOMI, no exophthalmos ENT: moist mucous membranes, no thyromegaly, no cervical lymphadenopathy Cardiovascular: RRR, No MRG, + R>L slight peri-ankle swelling Respiratory: CTA B Gastrointestinal: abdomen soft, NT, ND, BS+ Musculoskeletal: no deformities, strength intact in all 4 Skin: moist, warm, no rashes Neurological:  no tremor with outstretched hands, DTR normal in all 4  ASSESSMENT: 1. DM2, non-insulin-dependent, uncontrolled, with complications - CAD - s/p NSTEMI (Dr. Terrence Dupont) - peripheral neuropathy - CKD  2. B12 def  3. HL  PLAN:  1. Patient with longstanding, uncontrolled, type 2 diabetes, with improvement in his sugars after improving  his diet.  He continues to eat lower fat foods and less concentrated sweets.  In the past, we tried to add an SGLT 2 inhibitor but this was not covered.  At last visit I again advised him to check with his insurance whether this class of medications are covered.  At last visit, he is sugars were higher, and we increased his glipizide to 5 mg twice a day. -Unfortunately, none of the SGLT 2 inhibitors are covered for him.  I would love to be able to add these especially with his history of heart disease.  We discussed about potentially adding a GLP-1 receptor agonist.  We discussed about benefits and possible side effects.  He agrees to try Rybelsus -we will try at the low dose and increase as tolerated. - I advised him to: Patient Instructions  Please  continue: - Metformin 1000 mg 2x a day - Glipizide 5 mg 2x a day before meals  Start: - Rybelsus 3 mg before b'fast x 2 weeks, then increase to 6 mg daily  Let me know how you do with the 6 mg so I can call in the higher dose of the medication.  Please return in 3-4 months with your sugar log.  - today, HbA1c is 7.8% (slightly better) - continue checking sugars at different times of the day - check 1x a day, rotating checks - advised for yearly eye exams >> he is not UTD - Return to clinic in 4 mo with sugar log      2.  Peripheral neuropathy  -Possibly related to diabetes and B12 def. -We initially started him on intramuscular injections of B12, then switched to 5000 mcg B12 daily, now, on 5000 mcg every other day -Latest vitamin B-12 level was only slightly elevated and we continued on every other day dosing  3. HL - Reviewed latest lipid panel from 08/2018: LDL much improved, triglycerides slightly high Lab Results  Component Value Date   CHOL 136 08/19/2018   HDL 45.20 08/19/2018   LDLCALC 52 08/19/2018   LDLDIRECT 139.8 07/08/2014   TRIG 193.0 (H) 08/19/2018   CHOLHDL 3 08/19/2018  - Continues Lipitor without side  effects.  Philemon Kingdom, MD PhD [facility] Endocrinology

## 2019-01-05 ENCOUNTER — Other Ambulatory Visit: Payer: Self-pay | Admitting: Internal Medicine

## 2019-01-05 ENCOUNTER — Encounter: Payer: Self-pay | Admitting: Internal Medicine

## 2019-01-06 ENCOUNTER — Other Ambulatory Visit: Payer: Self-pay

## 2019-01-06 DIAGNOSIS — E1165 Type 2 diabetes mellitus with hyperglycemia: Principal | ICD-10-CM

## 2019-01-06 DIAGNOSIS — E1159 Type 2 diabetes mellitus with other circulatory complications: Secondary | ICD-10-CM

## 2019-01-06 MED ORDER — GLUCOSE BLOOD VI STRP: ORAL_STRIP | 0 refills | Status: DC

## 2019-04-11 ENCOUNTER — Other Ambulatory Visit: Payer: Self-pay | Admitting: Internal Medicine

## 2019-04-14 ENCOUNTER — Other Ambulatory Visit: Payer: Self-pay | Admitting: Internal Medicine

## 2019-04-14 DIAGNOSIS — E1159 Type 2 diabetes mellitus with other circulatory complications: Secondary | ICD-10-CM

## 2019-04-14 DIAGNOSIS — E1165 Type 2 diabetes mellitus with hyperglycemia: Secondary | ICD-10-CM

## 2019-05-04 ENCOUNTER — Other Ambulatory Visit: Payer: Self-pay

## 2019-05-06 ENCOUNTER — Other Ambulatory Visit: Payer: Self-pay

## 2019-05-06 ENCOUNTER — Encounter: Payer: Self-pay | Admitting: Internal Medicine

## 2019-05-06 ENCOUNTER — Ambulatory Visit (INDEPENDENT_AMBULATORY_CARE_PROVIDER_SITE_OTHER): Payer: Medicare Other | Admitting: Internal Medicine

## 2019-05-06 VITALS — BP 140/92 | HR 85 | Ht 70.5 in | Wt 254.0 lb

## 2019-05-06 DIAGNOSIS — G6289 Other specified polyneuropathies: Secondary | ICD-10-CM | POA: Diagnosis not present

## 2019-05-06 DIAGNOSIS — E785 Hyperlipidemia, unspecified: Secondary | ICD-10-CM | POA: Diagnosis not present

## 2019-05-06 DIAGNOSIS — E1159 Type 2 diabetes mellitus with other circulatory complications: Secondary | ICD-10-CM

## 2019-05-06 DIAGNOSIS — E1165 Type 2 diabetes mellitus with hyperglycemia: Secondary | ICD-10-CM | POA: Diagnosis not present

## 2019-05-06 LAB — POCT GLYCOSYLATED HEMOGLOBIN (HGB A1C): Hemoglobin A1C: 7.6 % — AB (ref 4.0–5.6)

## 2019-05-06 NOTE — Patient Instructions (Addendum)
Please continue: - Glipizide 5 mg 2x a day before meals  Please move: - Metformin 2000 mg at dinnertime  Please limit snacks especially at night.  Please return in 3-4 months with your sugar log.

## 2019-05-06 NOTE — Progress Notes (Signed)
Patient ID: Charles [name], male   DOB: 1941-03-02, 78 y.o.   MRN: [id number]  HPI: Charles [name] is a 78 y.o.-year-old male, returning for f/u for DM2 dx 2008, non-insulin-dependent, uncontrolled, with complications (CAD - s/p NSTEMI, PN, mild CKD). Last visit 4 months ago.  He started to change his diet in 11/2017: Decreased sweets, fatty foods, not eating between meals.  Sugars improved afterwards but they increased after the holidays.  At last visit, he was still having dietary indiscretions and occasionally was forgetting his morning medication doses.  At this visit, he continues to have dietary indiscretions especially during the coronavirus pandemic.  He eats dinners late and has snacks after dinner.  DM2: Last hemoglobin A1c was: Lab Results  Component Value Date   HGBA1C 7.8 (A) 12/24/2018   HGBA1C 7.9 (H) 09/29/2018   HGBA1C 7.1 (A) 08/24/2018  12/2013: 8.6%  Pt is on a regimen of: - Metformin 1000 mg 2x a day - Glipizide 5 mg 2x a day before meals We tried Rybelsus 3 >> 6 mg daily-added 12/2018 >> $$$ >> stopped 01/2019 He checked with his insurance and none of the SGLT2 inhibitors are covered for him.  Pt checks his sugars once a day: - am:  113-170, 195 >> 119-188, 259 >> 112, 129-167, 185 >> 117, 147-186, 205 - 2h after b'fast: 68x1 >> n/c >> 201, 206 >> n/c - before lunch:  n/c >> 69, 79 >> n/c - 2h after lunch: 123-176 >> n/c - before dinner  n/c >> 160s >> n/c - 2h after dinner: n/c >> 240 >> n/c - bedtime: 110-150 >> 160s >> n/c Lowest sugar was  119 >> 90s >> 117; he has hypoglycemia awareness in the 80s. Highest sugar was 259 >> 200s >> 205 (missed medication).  -+ Mild CKD, last BUN/creatinine:  Lab Results  Component Value Date   BUN 21 10/26/2018   CREATININE 1.21 10/26/2018  Of note, he has a history of nephrectomy. On lisinopril 10.  -+ HL; last set of lipids: Lab Results  Component Value Date   CHOL 136 08/19/2018   HDL 45.20 08/19/2018   LDLCALC  52 08/19/2018   LDLDIRECT 139.8 07/08/2014   TRIG 193.0 (H) 08/19/2018   CHOLHDL 3 08/19/2018  On Lipitor 40. - last eye exam was in  11/2018: No DR; now seeing Dr. [place] - retinal vein occlusion, + cataracts, + macular "hole".  He has a history of eye surgeries.  He gets intraocular injections every 6 weeks. -He has improved numbness and tingling in his feet   B12 deficiency:  He was initially on B12 injections, currently on 5000 mcg every other day: Lab Results  Component Value Date   VITAMINB12 1,172 (H) 08/19/2018   VITAMINB12 >1500 (H) 05/04/2018   VITAMINB12 1,249 (H) 02/18/2017   VITAMINB12 234 10/22/2016   VITAMINB12 217 04/19/2016   VITAMINB12 94 (L) 10/13/2015   He was admitted with NSTEMI 10/18/2015.  He had stents placed then.  He was going to the gym 5 out of 7 days, now stopped during the coronavirus pandemic.  ROS: Constitutional: no weight gain/no weight loss, no fatigue, no subjective hyperthermia, no subjective hypothermia Eyes: no blurry vision, no xerophthalmia ENT: no sore throat, no nodules palpated in neck, no dysphagia, no odynophagia, no hoarseness Cardiovascular: no CP/no SOB/no palpitations/no leg swelling Respiratory: no cough/no SOB/no wheezing Gastrointestinal: no N/no V/no D/no C/no acid reflux Musculoskeletal: no muscle aches/no joint aches Skin: no rashes, no hair loss Neurological:  no tremors/no numbness/no tingling/no dizziness  I reviewed pt's medications, allergies, PMH, social hx, family hx, and changes were documented in the history of present illness. Otherwise, unchanged from my initial visit note.  Past Medical History:  Diagnosis Date  . Anemia    "when I was a lad"  . Arthritis    "right femur" (10/19/2015)  . AVM (arteriovenous malformation) of colon    2 - non-bleeding 2013  . Biceps muscle tear    right  . Cancer of kidney ([place]) 2000   left nephrectomy  . Coronary artery disease   . Eczema   . GERD (gastroesophageal reflux  disease)   . Hernia, ventral   . Hyperlipidemia   . Hypertension   . Obesity   . Personal history of adenomatous colonic polyps 07/20/2012   3 + adenomas 2009 07/20/2012 2 diminutive polyps    . Pneumonia 1942; 1950s X 1  . Type II diabetes mellitus ([place])   . Walking pneumonia 2000's X 1   Past Surgical History:  Procedure Laterality Date  . CARDIAC CATHETERIZATION N/A 10/19/2015   Procedure: Left Heart Cath and Coronary Angiography;  Surgeon: Charolette Forward, MD;  Location: [facility];  Service: Cardiovascular;  Laterality: N/A;  . CARDIAC CATHETERIZATION N/A 10/19/2015   Procedure: Coronary Stent Intervention;  Surgeon: Charolette Forward, MD;  Location: [facility];  Service: Cardiovascular;  Laterality: N/A;  . CATARACT EXTRACTION, BILATERAL Bilateral 01/2018   Dr. Kathrin Penner did surgery and he developed MD and is seeing specialist now for MD treatment  . COLONOSCOPY     "nothing showed up this time"  . COLONOSCOPY W/ BIOPSIES AND POLYPECTOMY  X 1  . CORONARY ANGIOPLASTY    . FEMUR IM NAIL Right 07/26/2013   Procedure: INTRAMEDULLARY (IM) NAIL FEMORAL subtrochanteric;  Surgeon: Mauri Pole, MD;  Location: [facility];  Service: Orthopedics;  Laterality: Right;  . FRACTURE SURGERY    . INGUINAL HERNIA REPAIR Left >3 times  . INGUINAL HERNIA REPAIR Right 2012  . NEPHRECTOMY Left 2000  . TONSILLECTOMY  1940s   Social History   Socioeconomic History  . Marital status: Widowed    Spouse name: Not on file  . Number of children: Not on file  . Years of education: Not on file  . Highest education level: Not on file  Occupational History  . Not on file  Social Needs  . Financial resource strain: Not on file  . Food insecurity    Worry: Not on file    Inability: Not on file  . Transportation needs    Medical: Not on file    Non-medical: Not on file  Tobacco Use  . Smoking status: Former Smoker    Years: 20.00    Types: Cigarettes, Pipe, Cigars    Quit date: 09/09/1968     Years since quitting: 50.6  . Smokeless tobacco: Never Used  . Tobacco comment: smoking cigars- quit in 13   Substance and Sexual Activity  . Alcohol use: Yes    Alcohol/week: 1.0 standard drinks    Types: 1 Cans of beer per week    Comment: occasionally   . Drug use: No  . Sexual activity: Never  Lifestyle  . Physical activity    Days per week: Not on file    Minutes per session: Not on file  . Stress: Not on file  Relationships  . Social Herbalist on phone: Not on file    Gets  together: Not on file    Attends religious service: Not on file    Active member of club or organization: Not on file    Attends meetings of clubs or organizations: Not on file    Relationship status: Not on file  . Intimate partner violence    Fear of current or ex partner: Not on file    Emotionally abused: Not on file    Physically abused: Not on file    Forced sexual activity: Not on file  Other Topics Concern  . 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    Not married- widowed    Three children, One in [place], one in [place] one in [place]   Current Outpatient Medications on File Prior to Visit  Medication Sig Dispense Refill  . atorvastatin (LIPITOR) 40 MG tablet Take 1 tablet (40 mg total) by mouth daily at 6 PM. 30 tablet 3  . Bioflavonoid Products (VITAMIN C) CHEW Chew 1 tablet by mouth daily.     . cyanocobalamin 1000 MCG tablet Take 5,000 mcg by mouth every other day.     Marland Kitchen EPINEPHrine (EPIPEN 2-PAK) 0.3 mg/0.3 mL DEVI Inject 0.3 mg into the muscle daily as needed (allergic reaction).     Marland Kitchen glipiZIDE (GLUCOTROL) 5 MG tablet TAKE 1 TABLET BY MOUTH 2  TIMES DAILY BEFORE A MEAL (Patient taking differently: Take 2.5 mg by mouth 2 (two) times daily before a meal. ) 180 tablet 3  . lisinopril (PRINIVIL,ZESTRIL) 5 MG tablet Take 1 tablet (5 mg total) by mouth  daily. 30 tablet 3  . metFORMIN (GLUCOPHAGE) 1000 MG tablet TAKE 1 TABLET BY MOUTH  TWICE DAILY WITH MEALS 180 tablet 3  . metoprolol succinate (TOPROL-XL) 50 MG 24 hr tablet Take 50 mg by mouth daily.     . Multiple Vitamins-Minerals (ICAPS PO) Take 1 tablet by mouth daily.    Marland Kitchen neomycin-polymyxin-dexameth (MAXITROL) 0.1 % OINT Place 1 application into the left eye 2 (two) times daily.    . nitroGLYCERIN (NITROSTAT) 0.4 MG SL tablet Place 1 tablet (0.4 mg total) under the tongue every 5 (five) minutes as needed for chest pain. 25 tablet 1  . ONETOUCH DELICA LANCETS FINE MISC Use to check sugar daily 100 each 5  . ONETOUCH VERIO test strip USE 1 STRIP TO CHECK GLUCOSE ONCE DAILY 50 each 0  . Semaglutide (RYBELSUS) 3 MG TABS Take 1 tablet by mouth daily. Before b'fast 30 tablet 1  . sodium chloride (OCEAN) 0.65 % SOLN nasal spray Place 2 sprays into both nostrils 3 (three) times daily. 1 Bottle 0  . vitamin E 400 UNIT capsule Take 400 Units by mouth daily.     No current facility-administered medications on file prior to visit.    No Known Allergies Family History  Problem Relation Age of Onset  . Drug abuse Other   . Cancer Other   . Heart disease Other   . Lung disease Other   . Diabetes Mother   . Diabetes Sister   . Colon cancer Neg Hx   . Stomach cancer Neg Hx     PE: BP (!) 140/92   Pulse 85   Ht 5' 10.5" (1.791 m)   Wt 254 lb (115.2 kg)   SpO2 97%   BMI 35.93 kg/m  Wt Readings from Last 3 Encounters:  05/06/19  254 lb (115.2 kg)  12/24/18 251 lb (113.9 kg)  12/22/18 252 lb 1.6 oz (114.4 kg)   Constitutional: overweight, in NAD Eyes: PERRLA, EOMI, no exophthalmos ENT: moist mucous membranes, no thyromegaly, no cervical lymphadenopathy Cardiovascular: RRR, No MRG, + R>L slight peri-ankle swelling Respiratory: CTA B Gastrointestinal: abdomen soft, NT, ND, BS+ Musculoskeletal: no deformities, strength intact in all 4 Skin: moist, warm, no rashes Neurological: no  tremor with outstretched hands, DTR normal in all 4  ASSESSMENT: 1. DM2, non-insulin-dependent, uncontrolled, with complications - CAD - s/p NSTEMI (Dr. Terrence Dupont) - peripheral neuropathy - CKD  2. B12 def  3. HL  PLAN:  1. Patient with longstanding, uncontrolled, type 2 diabetes, with improvement in his sugars after improving his diet but with dietary indiscretions and higher blood sugars at the last 2 visits.  In the past, we tried to add SGLT2 inhibitor but none of the medications in the class or affordable.  At last visit we added Rybelsus (oral GLP-1 receptor agonist).  He was able to take this for a month but did not feel it significantly helped.  Also, the price was very high so he had to stop. -At this visit, sugars are still high in the morning, and fluctuating, depending on whether he had snacks or not the night before.  Unfortunately, we cannot use any of the higher Tier medications so for now I advised him to move the entire metformin dose with dinner, but will not make any other changes.  I advised him to limit snacking and continue to stay active and hopefully start exercising soon as gyms open. - I advised him to: Patient Instructions  Please continue: - Glipizide 5 mg 2x a day before meals  Please move: - Metformin 2000 mg at dinnertime  Please limit snacks especially at night.  Please return in 3-4 months with your sugar log.  - we checked his HbA1c: 7.6% (slightly better) - advised to check sugars at different times of the day - 1x a day, rotating check times - advised for yearly eye exams >> he is UTD - return to clinic in 3-4 months      2.  Peripheral neuropathy  -Possibly related to diabetes and also B12 deficiency -We initially started him on intramuscular injections of B12, then switched to 5000 mcg B12 daily, now, on 5000 mcg every other day -Latest vitamin B12 level was only slightly elevated and we continued every other day dosing  3. HL - Reviewed  latest lipid panel from 08/2018: LDL excellent, triglycerides slightly high Lab Results  Component Value Date   CHOL 136 08/19/2018   HDL 45.20 08/19/2018   LDLCALC 52 08/19/2018   LDLDIRECT 139.8 07/08/2014   TRIG 193.0 (H) 08/19/2018   CHOLHDL 3 08/19/2018  - Continues Lipitor without side effects.  Philemon Kingdom, MD PhD [facility] Endocrinology

## 2019-05-06 NOTE — Addendum Note (Signed)
Addended by: Cardell Peach I on: 05/06/2019 10:04 AM   Modules accepted: Orders

## 2019-05-25 ENCOUNTER — Other Ambulatory Visit: Payer: Self-pay | Admitting: Internal Medicine

## 2019-05-25 DIAGNOSIS — E1159 Type 2 diabetes mellitus with other circulatory complications: Secondary | ICD-10-CM

## 2019-07-05 ENCOUNTER — Other Ambulatory Visit: Payer: Self-pay

## 2019-07-05 ENCOUNTER — Encounter: Payer: Self-pay | Admitting: Family Medicine

## 2019-07-05 ENCOUNTER — Ambulatory Visit (INDEPENDENT_AMBULATORY_CARE_PROVIDER_SITE_OTHER): Payer: Medicare Other | Admitting: Family Medicine

## 2019-07-05 ENCOUNTER — Ambulatory Visit (INDEPENDENT_AMBULATORY_CARE_PROVIDER_SITE_OTHER): Payer: Medicare Other

## 2019-07-05 VITALS — BP 120/78 | HR 78 | Temp 97.5°F | Resp 16 | Ht 70.5 in | Wt 255.0 lb

## 2019-07-05 DIAGNOSIS — W108XXA Fall (on) (from) other stairs and steps, initial encounter: Secondary | ICD-10-CM | POA: Diagnosis not present

## 2019-07-05 DIAGNOSIS — Z23 Encounter for immunization: Secondary | ICD-10-CM

## 2019-07-05 DIAGNOSIS — Z9181 History of falling: Secondary | ICD-10-CM | POA: Diagnosis not present

## 2019-07-05 DIAGNOSIS — S4991XA Unspecified injury of right shoulder and upper arm, initial encounter: Secondary | ICD-10-CM | POA: Diagnosis not present

## 2019-07-05 DIAGNOSIS — S43421A Sprain of right rotator cuff capsule, initial encounter: Secondary | ICD-10-CM

## 2019-07-05 MED ORDER — DICLOFENAC SODIUM 1 % TD GEL: 4.0000 g | Freq: Four times a day (QID) | TRANSDERMAL | 1 refills | Status: DC

## 2019-07-05 NOTE — Progress Notes (Signed)
ACUTE VISIT   HPI:  Chief Complaint  Patient presents with  . Fall    Charles [name] is a 78 y.o. male with history of hypertension, CAD, DM II,hyperlipidemia, and peripheral neuropathy who is here today complaining of right shoulder pain that started yesterday after a fall. Around 8:45 AM yesterday when he was leaving church, he was going downstairs, he thought he was on the last step, tripped, and landed on the right side.  He denies head trauma or LOC.  Pain started right after the fall, it seems to be getting worse. Limitation of range of motion. Sharp pain, intermittently, 5-6/10, exacerbated by certain movements and alleviated by rest.  He took 2 Aleve yesterday. Denies neck pain, UE numbness,or tingling.  History of OA.  He does not use assistance for transfer.  Review of Systems  Constitutional: Positive for activity change. Negative for appetite change, chills and fever.  Respiratory: Negative for cough and shortness of breath.   Cardiovascular: Negative for chest pain and palpitations.  Gastrointestinal: Negative for abdominal pain, nausea and vomiting.  Musculoskeletal: Positive for arthralgias.  Skin: Negative for rash and wound.  Rest see pertinent positives and negatives per HPI.   Current Outpatient Medications on File Prior to Visit  Medication Sig Dispense Refill  . atorvastatin (LIPITOR) 40 MG tablet Take 1 tablet (40 mg total) by mouth daily at 6 PM. 30 tablet 3  . Bioflavonoid Products (VITAMIN C) CHEW Chew 1 tablet by mouth daily.     . cyanocobalamin 1000 MCG tablet Take 5,000 mcg by mouth every other day.     Marland Kitchen EPINEPHrine (EPIPEN 2-PAK) 0.3 mg/0.3 mL DEVI Inject 0.3 mg into the muscle daily as needed (allergic reaction).     Marland Kitchen glipiZIDE (GLUCOTROL) 5 MG tablet TAKE 1 TABLET BY MOUTH 2  TIMES DAILY BEFORE A MEAL (Patient taking differently: Take 2.5 mg by mouth 2 (two) times daily before a meal. ) 180 tablet 3  . lisinopril  (PRINIVIL,ZESTRIL) 5 MG tablet Take 1 tablet (5 mg total) by mouth daily. 30 tablet 3  . metFORMIN (GLUCOPHAGE) 1000 MG tablet TAKE 1 TABLET BY MOUTH  TWICE DAILY WITH MEALS 180 tablet 3  . metoprolol succinate (TOPROL-XL) 50 MG 24 hr tablet Take 50 mg by mouth daily.     . Multiple Vitamins-Minerals (ICAPS PO) Take 1 tablet by mouth daily.    Marland Kitchen neomycin-polymyxin-dexameth (MAXITROL) 0.1 % OINT Place 1 application into the left eye 2 (two) times daily.    . nitroGLYCERIN (NITROSTAT) 0.4 MG SL tablet Place 1 tablet (0.4 mg total) under the tongue every 5 (five) minutes as needed for chest pain. 25 tablet 1  . ONETOUCH DELICA LANCETS FINE MISC Use to check sugar daily 100 each 5  . ONETOUCH VERIO test strip USE 1 STRIP TO CHECK GLUCOSE ONCE DAILY 50 each 0  . sodium chloride (OCEAN) 0.65 % SOLN nasal spray Place 2 sprays into both nostrils 3 (three) times daily. 1 Bottle 0  . vitamin E 400 UNIT capsule Take 400 Units by mouth daily.     No current facility-administered medications on file prior to visit.      Past Medical History:  Diagnosis Date  . Anemia    "when I was a lad"  . Arthritis    "right femur" (10/19/2015)  . AVM (arteriovenous malformation) of colon    2 - non-bleeding 2013  . Biceps muscle tear    right  . Cancer  of kidney ([place]) 2000   left nephrectomy  . Coronary artery disease   . Eczema   . GERD (gastroesophageal reflux disease)   . Hernia, ventral   . Hyperlipidemia   . Hypertension   . Obesity   . Personal history of adenomatous colonic polyps 07/20/2012   3 + adenomas 2009 07/20/2012 2 diminutive polyps    . Pneumonia 1942; 1950s X 1  . Type II diabetes mellitus ([place])   . Walking pneumonia 2000's X 1   No Known Allergies  Social History   Socioeconomic History  . Marital status: Widowed    Spouse name: Not on file  . Number of children: Not on file  . Years of education: Not on file  . Highest education level: Not on file  Occupational History  .  Not on file  Social Needs  . Financial resource strain: Not on file  . Food insecurity    Worry: Not on file    Inability: Not on file  . Transportation needs    Medical: Not on file    Non-medical: Not on file  Tobacco Use  . Smoking status: Former Smoker    Years: 20.00    Types: Cigarettes, Pipe, Cigars    Quit date: 09/09/1968    Years since quitting: 50.8  . Smokeless tobacco: Never Used  . Tobacco comment: smoking cigars- quit in 13   Substance and Sexual Activity  . Alcohol use: Yes    Alcohol/week: 1.0 standard drinks    Types: 1 Cans of beer per week    Comment: occasionally   . Drug use: No  . Sexual activity: Never  Lifestyle  . Physical activity    Days per week: Not on file    Minutes per session: Not on file  . Stress: Not on file  Relationships  . Social Herbalist on phone: Not on file    Gets together: Not on file    Attends religious service: Not on file    Active member of club or organization: Not on file    Attends meetings of clubs or organizations: Not on file    Relationship status: Not on file  Other Topics Concern  . 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    Not married- widowed    Three children, One in [place], one in [place] one in [place]:   07/05/19 1100  BP: 120/78  Pulse: 78  Resp: 16  Temp: (!) 97.5 F (36.4 C)  SpO2: 97%   Body mass index is 36.07 kg/m.  Physical Exam  Nursing note and vitals reviewed. Constitutional: He is oriented to person, place, and time. He appears well-developed. He does not appear ill. No distress.  HENT:  Head: Normocephalic and atraumatic.  Eyes: Conjunctivae are normal.  Cardiovascular: Normal rate and regular rhythm.  Pulses:      Radial pulses are 2+ on the right side.  Respiratory: Effort normal. No respiratory distress.  Musculoskeletal:         General: No edema.     Right shoulder: He exhibits decreased range of motion. He exhibits no bony tenderness and no swelling.     Cervical back: He exhibits no tenderness and no bony tenderness.     Comments: Right shoulder: No deformity, edema, or erythema appreciated.Mild muscle atrophy,bilateral.  Luan Pulling' test neg, drop arm rotator cuff test pos, empty can supraspinatus test pos, cross body adduction test pos, lift-Off Subscapularis test pos. ROM limited.  Neurological: He is alert and oriented to person, place, and time. He has normal strength.  Gait is not assisted.  Skin: Skin is warm. No ecchymosis and no rash noted. No erythema.  Psychiatric: He has a normal mood and affect.  Well groomed,good eye contact.    ASSESSMENT AND PLAN:  Mr. Charles [name] was seen today for fall.  Diagnoses and all orders for this visit:  Fall (on) (from) other stairs and steps, initial encounter Fall precautions discussed. PT will be arranged.  -     DG Shoulder Right; Future -     Ambulatory referral to Physical Therapy -     DG Shoulder Right  Injury of right shoulder, initial encounter Further recommendations according to X ray. Topical Voltaren for pain qid prn. Local ice.  -     DG Shoulder Right; Future -     Ambulatory referral to Physical Therapy -     DG Shoulder Right  At risk for falls -     Ambulatory referral to Physical Therapy  Sprain of right rotator cuff capsule, initial encounter X ray no fracture. ROM exercises. PT will be arranged.  -     Ambulatory referral to Physical Therapy -     diclofenac sodium (VOLTAREN) 1 % GEL; Apply 4 g topically 4 (four) times daily. -     DG Shoulder Right  Need for immunization against influenza -     Flu Vaccine QUAD High Dose(Fluad)  Return if symptoms worsen or fail to improve.     Charles Skorupski G. [place], MD  [facility]. [place] office.

## 2019-07-05 NOTE — Patient Instructions (Signed)
A few things to remember from today's visit:   Fall (on) (from) other stairs and steps, initial encounter - Plan: DG Shoulder Right, Ambulatory referral to Physical Therapy  Injury of right shoulder, initial encounter - Plan: DG Shoulder Right, Ambulatory referral to Physical Therapy  At risk for falls - Plan: Ambulatory referral to Physical Therapy  Sprain of right rotator cuff capsule, initial encounter - Plan: Ambulatory referral to Physical Therapy  Avoid over-the-counter naproxen, ibuprofen, or Motrin. Apply topical Voltaren. You can also take Tylenol over-the-counter 500 mg 3 times per day. Gentle range of motion exercises. Physical therapy will be arranged.   Please be sure medication list is accurate. If a new problem present, please set up appointment sooner than planned today.

## 2019-07-07 ENCOUNTER — Encounter: Payer: Self-pay | Admitting: Family Medicine

## 2019-07-07 ENCOUNTER — Ambulatory Visit: Payer: Medicare Other

## 2019-07-18 ENCOUNTER — Other Ambulatory Visit: Payer: Self-pay | Admitting: Family Medicine

## 2019-07-18 DIAGNOSIS — S43421A Sprain of right rotator cuff capsule, initial encounter: Secondary | ICD-10-CM

## 2019-07-18 MED ORDER — DICLOFENAC SODIUM 1 % TD GEL: 4.0000 g | Freq: Four times a day (QID) | TRANSDERMAL | 1 refills | Status: DC

## 2019-07-24 ENCOUNTER — Other Ambulatory Visit: Payer: Self-pay | Admitting: Internal Medicine

## 2019-07-24 DIAGNOSIS — E1159 Type 2 diabetes mellitus with other circulatory complications: Secondary | ICD-10-CM

## 2019-08-17 ENCOUNTER — Other Ambulatory Visit: Payer: Self-pay

## 2019-08-19 ENCOUNTER — Encounter: Payer: Self-pay | Admitting: Internal Medicine

## 2019-08-19 ENCOUNTER — Ambulatory Visit: Payer: Medicare Other | Admitting: Internal Medicine

## 2019-08-19 VITALS — BP 120/87 | HR 88 | Ht 70.5 in | Wt 250.0 lb

## 2019-08-19 DIAGNOSIS — E1165 Type 2 diabetes mellitus with hyperglycemia: Secondary | ICD-10-CM | POA: Diagnosis not present

## 2019-08-19 DIAGNOSIS — E785 Hyperlipidemia, unspecified: Secondary | ICD-10-CM

## 2019-08-19 DIAGNOSIS — E538 Deficiency of other specified B group vitamins: Secondary | ICD-10-CM | POA: Diagnosis not present

## 2019-08-19 DIAGNOSIS — E1159 Type 2 diabetes mellitus with other circulatory complications: Secondary | ICD-10-CM | POA: Diagnosis not present

## 2019-08-19 LAB — POCT GLYCOSYLATED HEMOGLOBIN (HGB A1C): Hemoglobin A1C: 8 % — AB (ref 4.0–5.6)

## 2019-08-19 MED ORDER — INSULIN SYRINGES (DISPOSABLE) U-100 0.5 ML MISC: 3 refills | Status: DC

## 2019-08-19 MED ORDER — INSULIN NPH (HUMAN) (ISOPHANE) 100 UNIT/ML ~~LOC~~ SUSP: SUBCUTANEOUS | 11 refills | Status: DC

## 2019-08-19 NOTE — Progress Notes (Signed)
Patient ID: Charles [name], male   DOB: [birth year], 78 y.o.   MRN: [id number]  This visit occurred during the SARS-CoV-2 public health emergency.  Safety protocols were in place, including screening questions prior to the visit, additional usage of staff PPE, and extensive cleaning of exam room while observing appropriate contact time as indicated for disinfecting solutions.   HPI: Charles [name] is a 78 y.o.-year-old male, returning for f/u for DM2 dx 2008, insulin-dependent, uncontrolled, with complications (CAD - s/p NSTEMI, PN, mild CKD). Last visit 3.5 months ago.  DM2: Last hemoglobin A1c was: Lab Results  Component Value Date   HGBA1C 7.6 (A) 05/06/2019   HGBA1C 7.8 (A) 12/24/2018   HGBA1C 7.9 (H) 09/29/2018  12/2013: 8.6%  Pt is on a regimen of: - Metformin 1000 mg 2x a day >> 2000 mg with dinner (but sugars worse) >> 1000 mg 2x a day - Glipizide 5 mg 2x a day before meals We tried Rybelsus 3 >> 6 mg daily-added 12/2018 >> $$$ >> stopped 01/2019 He checked with his insurance and none of the SGLT2 inhibitors are covered for him.  Pt checks his sugars once a day: - am:  112, 129-167, 185 >> 117, 147-186, 205 >> 148, 158-198, 220 - 2h after b'fast: 68x1 >> n/c >> 201, 206 >> n/c - before lunch:  n/c >> 69, 79 >> n/c  - 2h after lunch: 123-176 >> n/c - before dinner  n/c >> 160s >> n/c - 2h after dinner: n/c >> 240 >> n/c - bedtime: 110-150 >> 160s >> n/c Lowest sugar was  119 >> 90s >> 117 >> 147; he has hypoglycemia awareness in the 80s. Highest sugar was 259 >> 200s >> 205 (missed medication) >> 220.  -+ Mild CKD, last BUN/creatinine:  Lab Results  Component Value Date   BUN 21 10/26/2018   CREATININE 1.21 10/26/2018  Of note, he has a history of nephrectomy. On lisinopril 10.  -+ HL; last set of lipids: Lab Results  Component Value Date   CHOL 136 08/19/2018   HDL 45.20 08/19/2018   LDLCALC 52 08/19/2018   LDLDIRECT 139.8 07/08/2014   TRIG 193.0 (H) 08/19/2018   CHOLHDL 3 08/19/2018  On Lipitor 40. - last eye exam was in 11/2018: No DR; now seeing Dr. [place] - retinal vein occlusion, + cataracts, + macular "hole".  He has a history of eye surgeries.  He gets intraocular injections q. 6 weeks. -He has numbness and tingling in his feet but these have improved  B12 deficiency:  He was initially on B12 injections, currently on 5000 mcg every other day: Lab Results  Component Value Date   VITAMINB12 1,172 (H) 08/19/2018   VITAMINB12 >1500 (H) 05/04/2018   VITAMINB12 1,249 (H) 02/18/2017   VITAMINB12 234 10/22/2016   VITAMINB12 217 04/19/2016   VITAMINB12 94 (L) 10/13/2015   He was admitted with NSTEMI 10/18/2015.  He had stents placed then.  He was going to the gym 5 out of 7 days, but then stopped during the coronavirus pandemic..  ROS: Constitutional: no weight gain/no weight loss, no fatigue, no subjective hyperthermia, no subjective hypothermia Eyes: no blurry vision, no xerophthalmia ENT: no sore throat, no nodules palpated in neck, no dysphagia, no odynophagia, no hoarseness Cardiovascular: no CP/no SOB/no palpitations/no leg swelling Respiratory: no cough/no SOB/no wheezing Gastrointestinal: no N/no V/no D/no C/no acid reflux Musculoskeletal: no muscle aches/no joint aches Skin: no rashes, no hair loss Neurological: no tremors/no numbness/no tingling/no dizziness  I  reviewed pt's medications, allergies, PMH, social hx, family hx, and changes were documented in the history of present illness. Otherwise, unchanged from my initial visit note.  Past Medical History:  Diagnosis Date  . Anemia    "when I was a lad"  . Arthritis    "right femur" (10/19/2015)  . AVM (arteriovenous malformation) of colon    2 - non-bleeding 2013  . Biceps muscle tear    right  . Cancer of kidney ([place]) 2000   left nephrectomy  . Coronary artery disease   . Eczema   . GERD (gastroesophageal reflux disease)   . Hernia, ventral   . Hyperlipidemia   .  Hypertension   . Obesity   . Personal history of adenomatous colonic polyps 07/20/2012   3 + adenomas 2009 07/20/2012 2 diminutive polyps    . Pneumonia 1942; 1950s X 1  . Type II diabetes mellitus ([place])   . Walking pneumonia 2000's X 1   Past Surgical History:  Procedure Laterality Date  . CARDIAC CATHETERIZATION N/A 10/19/2015   Procedure: Left Heart Cath and Coronary Angiography;  Surgeon: Charolette Forward, MD;  Location: [facility];  Service: Cardiovascular;  Laterality: N/A;  . CARDIAC CATHETERIZATION N/A 10/19/2015   Procedure: Coronary Stent Intervention;  Surgeon: Charolette Forward, MD;  Location: [facility];  Service: Cardiovascular;  Laterality: N/A;  . CATARACT EXTRACTION, BILATERAL Bilateral 01/2018   Dr. Kathrin Penner did surgery and he developed MD and is seeing specialist now for MD treatment  . COLONOSCOPY     "nothing showed up this time"  . COLONOSCOPY W/ BIOPSIES AND POLYPECTOMY  X 1  . CORONARY ANGIOPLASTY    . FEMUR IM NAIL Right 07/26/2013   Procedure: INTRAMEDULLARY (IM) NAIL FEMORAL subtrochanteric;  Surgeon: Mauri Pole, MD;  Location: [facility];  Service: Orthopedics;  Laterality: Right;  . FRACTURE SURGERY    . INGUINAL HERNIA REPAIR Left >3 times  . INGUINAL HERNIA REPAIR Right 2012  . NEPHRECTOMY Left 2000  . TONSILLECTOMY  1940s   Social History   Socioeconomic History  . Marital status: Widowed    Spouse name: Not on file  . Number of children: Not on file  . Years of education: Not on file  . Highest education level: Not on file  Occupational History  . Not on file  Tobacco Use  . Smoking status: Former Smoker    Years: 20.00    Types: Cigarettes, Pipe, Cigars    Quit date: 09/09/1968    Years since quitting: 50.9  . Smokeless tobacco: Never Used  . Tobacco comment: smoking cigars- quit in 13   Substance and Sexual Activity  . Alcohol use: Yes    Alcohol/week: 1.0 standard drinks    Types: 1 Cans of beer per week    Comment:  occasionally   . Drug use: No  . Sexual activity: Never  Other Topics Concern  . 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    Not married- widowed    Three children, One in [place], one in [place] one in [place] Determinants of Health   Financial Resource Strain:   . Difficulty of Paying Living Expenses: Not on file  Food Insecurity:   . Worried About Charity fundraiser in the Last Year: Not on file  . Ran Out of  Food in the Last Year: Not on file  Transportation Needs:   . Lack of Transportation (Medical): Not on file  . Lack of Transportation (Non-Medical): Not on file  Physical Activity:   . Days of Exercise per Week: Not on file  . Minutes of Exercise per Session: Not on file  Stress:   . Feeling of Stress : Not on file  Social Connections:   . Frequency of Communication with Friends and Family: Not on file  . Frequency of Social Gatherings with Friends and Family: Not on file  . Attends Religious Services: Not on file  . Active Member of Clubs or Organizations: Not on file  . Attends Archivist Meetings: Not on file  . Marital Status: Not on file  Intimate Partner Violence:   . Fear of Current or Ex-Partner: Not on file  . Emotionally Abused: Not on file  . Physically Abused: Not on file  . Sexually Abused: Not on file   Current Outpatient Medications on File Prior to Visit  Medication Sig Dispense Refill  . atorvastatin (LIPITOR) 40 MG tablet Take 1 tablet (40 mg total) by mouth daily at 6 PM. 30 tablet 3  . Bioflavonoid Products (VITAMIN C) CHEW Chew 1 tablet by mouth daily.     . cyanocobalamin 1000 MCG tablet Take 5,000 mcg by mouth every other day.     . diclofenac sodium (VOLTAREN) 1 % GEL Apply 4 g topically 4 (four) times daily. 150 g 1  . EPINEPHrine (EPIPEN 2-PAK) 0.3 mg/0.3 mL DEVI Inject 0.3 mg into the muscle daily  as needed (allergic reaction).     Marland Kitchen glipiZIDE (GLUCOTROL) 5 MG tablet TAKE 1 TABLET BY MOUTH 2  TIMES DAILY BEFORE A MEAL (Patient taking differently: Take 2.5 mg by mouth 2 (two) times daily before a meal. ) 180 tablet 3  . lisinopril (PRINIVIL,ZESTRIL) 5 MG tablet Take 1 tablet (5 mg total) by mouth daily. 30 tablet 3  . metFORMIN (GLUCOPHAGE) 1000 MG tablet TAKE 1 TABLET BY MOUTH  TWICE DAILY WITH MEALS 180 tablet 3  . metoprolol succinate (TOPROL-XL) 50 MG 24 hr tablet Take 50 mg by mouth daily.     . Multiple Vitamins-Minerals (ICAPS PO) Take 1 tablet by mouth daily.    Marland Kitchen neomycin-polymyxin-dexameth (MAXITROL) 0.1 % OINT Place 1 application into the left eye 2 (two) times daily.    . nitroGLYCERIN (NITROSTAT) 0.4 MG SL tablet Place 1 tablet (0.4 mg total) under the tongue every 5 (five) minutes as needed for chest pain. 25 tablet 1  . ONETOUCH DELICA LANCETS FINE MISC Use to check sugar daily 100 each 5  . ONETOUCH VERIO test strip USE 1 STRIP TO CHECK GLUCOSE ONCE DAILY 50 each 0  . sodium chloride (OCEAN) 0.65 % SOLN nasal spray Place 2 sprays into both nostrils 3 (three) times daily. 1 Bottle 0  . vitamin E 400 UNIT capsule Take 400 Units by mouth daily.     No current facility-administered medications on file prior to visit.   No Known Allergies Family History  Problem Relation Age of Onset  . Drug abuse Other   . Cancer Other   . Heart disease Other   . Lung disease Other   . Diabetes Mother   . Diabetes Sister   . Colon cancer Neg Hx   . Stomach cancer Neg Hx     PE: BP 120/87   Pulse 88   Ht 5' 10.5" (1.791 m)   Wt  250 lb (113.4 kg)   SpO2 96%   BMI 35.36 kg/m  Wt Readings from Last 3 Encounters:  08/19/19 250 lb (113.4 kg)  07/05/19 255 lb (115.7 kg)  05/06/19 254 lb (115.2 kg)   Constitutional: overweight, in NAD Eyes: PERRLA, EOMI, no exophthalmos ENT: moist mucous membranes, no thyromegaly, no cervical lymphadenopathy Cardiovascular: RRR, No MRG, + R>L  periankle swelling Respiratory: CTA B Gastrointestinal: abdomen soft, NT, ND, BS+ Musculoskeletal: no deformities, strength intact in all 4 Skin: moist, warm, no rashes Neurological: no tremor with outstretched hands, DTR normal in all 4  ASSESSMENT: 1. DM2, non-insulin-dependent, uncontrolled, with complications - CAD - s/p NSTEMI (Dr. Terrence Dupont) - peripheral neuropathy - CKD  2. B12 def  3. HL  PLAN:  1. Patient with longstanding, uncontrolled, type 2 diabetes, with improvement in his sugars after improving his diet.  In the past, we tried to add an SGLT 2 inhibitor to his regimen but these were not covered.  We tried Rybelsus at the previous visit but this was also expensive and it did not help his blood sugars so we stopped.  At last visit, sugars are high in the morning and fluctuating, based on whether he had snacks after dinner or not.  We discussed about the importance of stopping these.  Unfortunately, he cannot afford any of the higher tier medication so we did not change the regimen at last visit but we moved the entire dose of Metformin with dinner to hopefully improve the sugars in the morning.  However, she could not do this since sugars increased when he started to take the Metformin all at once.  We also discussed about starting exercising as soon as gyms open. HbA1c at last visit was slightly better, at 7.6%. -At this visit, sugars are higher in the morning and he still is not checking sugars later in the day.  Since we cannot use higher tier medications, I suggested to start NPH at bedtime only, since I do not have information about the sugars later in the day.  I did advise him today to check sugars later during the day to see if he does not need to increase the NPH to twice a day.  We reviewed correct injection techniques.  We will continue Metformin and glipizide. - I advised him to: Patient Instructions   Please continue: - Metformin 1000 mg 2x a day with meals - Glipizide  5 mg 2x a day before meals  Please start: - NPH 14 units at bedtime  Please return in 3-4 months with your sugar log.  - we checked his HbA1c: 8% (higher) - advised to check sugars at different times of the day - 1-2x a day, rotating check times - advised for yearly eye exams >> he is UTD - return to clinic in 3-4 months      2.  Peripheral neuropathy  -Possibly related to diabetes and B12 deficiency -We initially started him on intramuscular injections of B12, then switched to 5000 mcg B12 daily, now on 5000 mcg every other day -Latest vitamin B12 from a year ago was only slightly elevated and we continued every other day dosing -We did not have a lab technician today, so I advised him to get this checked by PCP next month  3. HL -Reviewed latest lipid panel from 08/2018: LDL excellent, triglycerides slightly high: Lab Results  Component Value Date   CHOL 136 08/19/2018   HDL 45.20 08/19/2018   LDLCALC 52 08/19/2018   LDLDIRECT  139.8 07/08/2014   TRIG 193.0 (H) 08/19/2018   CHOLHDL 3 08/19/2018  -Continues Lipitor without side effects - he is due for another lipid panel.  She will have this checked at next appointment with PCP next month.  Philemon Kingdom, MD PhD [facility] Endocrinology

## 2019-08-19 NOTE — Patient Instructions (Addendum)
Please continue: - Metformin 1000 mg 2x a day with meals - Glipizide 5 mg 2x a day before meals  Please start: - NPH 14 units at bedtime  Please return in 3-4 months with your sugar log.

## 2019-08-25 ENCOUNTER — Other Ambulatory Visit: Payer: Self-pay | Admitting: Internal Medicine

## 2019-08-25 DIAGNOSIS — E538 Deficiency of other specified B group vitamins: Secondary | ICD-10-CM

## 2019-08-25 DIAGNOSIS — E1122 Type 2 diabetes mellitus with diabetic chronic kidney disease: Secondary | ICD-10-CM

## 2019-09-13 ENCOUNTER — Other Ambulatory Visit: Payer: Self-pay | Admitting: Internal Medicine

## 2019-09-13 DIAGNOSIS — E1159 Type 2 diabetes mellitus with other circulatory complications: Secondary | ICD-10-CM

## 2019-10-08 ENCOUNTER — Encounter: Payer: Self-pay | Admitting: Adult Health

## 2019-10-08 ENCOUNTER — Ambulatory Visit (INDEPENDENT_AMBULATORY_CARE_PROVIDER_SITE_OTHER): Payer: Medicare Other | Admitting: Adult Health

## 2019-10-08 ENCOUNTER — Other Ambulatory Visit: Payer: Self-pay

## 2019-10-08 VITALS — BP 132/70 | HR 87 | Temp 97.2°F | Ht 70.75 in | Wt 244.0 lb

## 2019-10-08 DIAGNOSIS — E1159 Type 2 diabetes mellitus with other circulatory complications: Secondary | ICD-10-CM | POA: Diagnosis not present

## 2019-10-08 DIAGNOSIS — H6123 Impacted cerumen, bilateral: Secondary | ICD-10-CM | POA: Diagnosis not present

## 2019-10-08 DIAGNOSIS — G6289 Other specified polyneuropathies: Secondary | ICD-10-CM

## 2019-10-08 DIAGNOSIS — I10 Essential (primary) hypertension: Secondary | ICD-10-CM | POA: Diagnosis not present

## 2019-10-08 DIAGNOSIS — Z0000 Encounter for general adult medical examination without abnormal findings: Secondary | ICD-10-CM | POA: Diagnosis not present

## 2019-10-08 DIAGNOSIS — E538 Deficiency of other specified B group vitamins: Secondary | ICD-10-CM | POA: Diagnosis not present

## 2019-10-08 DIAGNOSIS — E785 Hyperlipidemia, unspecified: Secondary | ICD-10-CM

## 2019-10-08 DIAGNOSIS — E1165 Type 2 diabetes mellitus with hyperglycemia: Secondary | ICD-10-CM

## 2019-10-08 LAB — LIPID PANEL
Cholesterol: 127 mg/dL (ref 0–200)
HDL: 46.1 mg/dL (ref >39.00)
LDL Cholesterol: 55 mg/dL (ref 0–99)
NonHDL: 80.66
Total CHOL/HDL Ratio: 3
Triglycerides: 130 mg/dL (ref 0.0–149.0)
VLDL: 26 mg/dL (ref 0.0–40.0)

## 2019-10-08 LAB — CBC WITH DIFFERENTIAL/PLATELET
Basophils Absolute: 0.1 10*3/uL (ref 0.0–0.1)
Basophils Relative: 1.4 % (ref 0.0–3.0)
Eosinophils Absolute: 0.3 10*3/uL (ref 0.0–0.7)
Eosinophils Relative: 6.2 % — ABNORMAL HIGH (ref 0.0–5.0)
HCT: 43.4 % (ref 39.0–52.0)
Hemoglobin: 14.5 g/dL (ref 13.0–17.0)
Lymphocytes Relative: 19 % (ref 12.0–46.0)
Lymphs Abs: 1 10*3/uL (ref 0.7–4.0)
MCHC: 33.4 g/dL (ref 30.0–36.0)
MCV: 92.5 fl (ref 78.0–100.0)
Monocytes Absolute: 0.6 10*3/uL (ref 0.1–1.0)
Monocytes Relative: 11.3 % (ref 3.0–12.0)
Neutro Abs: 3.2 10*3/uL (ref 1.4–7.7)
Neutrophils Relative %: 62.1 % (ref 43.0–77.0)
Platelets: 221 10*3/uL (ref 150.0–400.0)
RBC: 4.69 Mil/uL (ref 4.22–5.81)
RDW: 15.2 % (ref 11.5–15.5)
WBC: 5.2 10*3/uL (ref 4.0–10.5)

## 2019-10-08 LAB — VITAMIN B12: Vitamin B-12: 1033 pg/mL — ABNORMAL HIGH (ref 211–911)

## 2019-10-08 LAB — COMPREHENSIVE METABOLIC PANEL
ALT: 29 U/L (ref 0–53)
AST: 26 U/L (ref 0–37)
Albumin: 4.3 g/dL (ref 3.5–5.2)
Alkaline Phosphatase: 77 U/L (ref 39–117)
BUN: 15 mg/dL (ref 6–23)
CO2: 31 mEq/L (ref 19–32)
Calcium: 9.4 mg/dL (ref 8.4–10.5)
Chloride: 101 mEq/L (ref 96–112)
Creatinine, Ser: 1.02 mg/dL (ref 0.40–1.50)
GFR: 70.54 mL/min (ref >60.00)
Glucose, Bld: 156 mg/dL — ABNORMAL HIGH (ref 70–99)
Potassium: 4.8 mEq/L (ref 3.5–5.1)
Sodium: 139 mEq/L (ref 135–145)
Total Bilirubin: 0.6 mg/dL (ref 0.2–1.2)
Total Protein: 6.9 g/dL (ref 6.0–8.3)

## 2019-10-08 LAB — TSH: TSH: 2.55 u[IU]/mL (ref 0.35–4.50)

## 2019-10-08 MED ORDER — MIRABEGRON ER 25 MG PO TB24: 25.0000 mg | ORAL_TABLET | Freq: Every day | ORAL | 1 refills | Status: DC

## 2019-10-08 NOTE — Progress Notes (Signed)
Subjective:    Patient ID: Charles [name], male    DOB: [birth year], 79 y.o.   MRN: [id number]  HPI Patient presents for yearly preventative medicine examination. He is a pleasant 79 year old male who  has a past medical history of Anemia, Arthritis, AVM (arteriovenous malformation) of colon, Biceps muscle tear, Cancer of kidney ([place]) (2000), Coronary artery disease, Eczema, GERD (gastroesophageal reflux disease), Hernia, ventral, Hyperlipidemia, Hypertension, Obesity, Personal history of adenomatous colonic polyps (07/20/2012), Pneumonia (1942; 1950s X 1), Type II diabetes mellitus ([place]), and Walking pneumonia (2000's X 1).  DM II -is followed by endocrinology Dr. Cruzita Lederer.  He is currently prescribed Metformin 1000 mg twice daily,glipizide 5 mg twice daily.  Most recently in December 2020 he was started on NPH insulin 14 units at bedtime.  During this visit his A1c was 8.0  Hyperlipidemia/CAD -currently prescribed Lipitor and denies any side effects.  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  He is currently taking 5000 mcg every other day.  Check B12 today  Hypertension -takes lisinopril and metoprolol.  He denies chest pain, shortness of breath, dizziness, lightheadedness, or headaches. BP Readings from Last 3 Encounters:  10/08/19 132/70  08/19/19 120/87  07/05/19 120/78   H/o of NSTEMI -in 2017.  Had stent placed.  Takes metoprolol and lipitor   All immunizations and health maintenance protocols were reviewed with the patient and needed orders were placed. He is up to date on routine vaccinations   Appropriate screening laboratory values were ordered for  the patient including screening of hyperlipidemia, renal function and hepatic function. If indicated by BPH, a PSA was ordered.  Medication reconciliation,  past medical history, social history, problem list and allergies were reviewed in detail with the patient  Goals were established with regard to weight loss, exercise, and  diet in compliance with medications  Wt Readings from Last 3 Encounters:  10/08/19 244 lb (110.7 kg)  08/19/19 250 lb (113.4 kg)  07/05/19 255 lb (115.7 kg)    End of life planning was discussed.  Review of Systems  Constitutional: Negative.   HENT: Positive for hearing loss (chronic).   Eyes: Negative.   Respiratory: Negative.   Cardiovascular: Negative.   Gastrointestinal: Negative.   Endocrine: Negative.   Genitourinary: Negative.   Musculoskeletal: Positive for arthralgias.  Skin: Negative.   Allergic/Immunologic: Negative.   Neurological: Negative.   Hematological: Negative.   Psychiatric/Behavioral: Negative.   All other systems reviewed and are negative.  Past Medical History:  Diagnosis Date  . Anemia    "when I was a lad"  . Arthritis    "right femur" (10/19/2015)  . AVM (arteriovenous malformation) of colon    2 - non-bleeding 2013  . Biceps muscle tear    right  . Cancer of kidney ([place]) 2000   left nephrectomy  . Coronary artery disease   . Eczema   . GERD (gastroesophageal reflux disease)   . Hernia, ventral   . Hyperlipidemia   . Hypertension   . Obesity   . Personal history of adenomatous colonic polyps 07/20/2012   3 + adenomas 2009 07/20/2012 2 diminutive polyps    .  Pneumonia 1942; 1950s X 1  . Type II diabetes mellitus ([place])   . Walking pneumonia 2000's X 1    Social History   Socioeconomic History  . Marital status: Widowed    Spouse name: Not on file  . Number of children: Not on file  . Years of education: Not on file  . Highest education level: Not on file  Occupational History  . Not on file  Tobacco Use    . Smoking status: Former Smoker    Years: 20.00    Types: Cigarettes, Pipe, Cigars    Quit date: 09/09/1968    Years since quitting: 51.1  . Smokeless tobacco: Never Used  . Tobacco comment: smoking cigars- quit in 13   Substance and Sexual Activity  . Alcohol use: Yes    Alcohol/week: 1.0 standard drinks    Types: 1 Cans of beer per week    Comment: occasionally   . Drug use: No  . Sexual activity: Never  Other Topics Concern  . Not on file  Social History Narrative   Regular exercise: [facility] the 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    Not married- widowed    Three children, One in [place], one in [place] one in [place] Determinants of Health   Financial Resource Strain:   . Difficulty of Paying Living Expenses: Not on file  Food Insecurity:   . Worried About Charity fundraiser in the Last Year: Not on file  . Ran Out of Food in the Last Year: Not on file  Transportation Needs:   . Lack of Transportation (Medical): Not on file  . Lack of Transportation (Non-Medical): Not on file  Physical Activity:   . Days of Exercise per Week: Not on file  . Minutes of Exercise per Session: Not on file  Stress:   . Feeling of Stress : Not on file  Social Connections:   . Frequency of Communication with Friends and Family: Not on file  . Frequency of Social Gatherings with Friends and Family: Not on file  . Attends Religious Services: Not on file  . Active Member of Clubs or Organizations: Not on file  . Attends Archivist Meetings: Not on file  . Marital Status: Not on file  Intimate Partner Violence:   . Fear of Current or Ex-Partner: Not on file  . Emotionally Abused: Not on file  . Physically Abused: Not on file  . Sexually Abused: Not on file    Past Surgical History:  Procedure Laterality Date  . CARDIAC CATHETERIZATION N/A 10/19/2015   Procedure: Left Heart Cath and Coronary Angiography;  Surgeon:  Charolette Forward, MD;  Location: [facility];  Service: Cardiovascular;  Laterality: N/A;  . CARDIAC CATHETERIZATION N/A 10/19/2015   Procedure: Coronary Stent Intervention;  Surgeon: Charolette Forward, MD;  Location: [facility];  Service: Cardiovascular;  Laterality: N/A;  . CATARACT EXTRACTION, BILATERAL Bilateral 01/2018   Dr. Kathrin Penner did surgery and he developed MD and is seeing specialist now for MD treatment  . COLONOSCOPY     "nothing showed up this time"  . COLONOSCOPY W/ BIOPSIES AND POLYPECTOMY  X 1  . CORONARY ANGIOPLASTY    . FEMUR IM NAIL Right 07/26/2013   Procedure: INTRAMEDULLARY (IM) NAIL FEMORAL subtrochanteric;  Surgeon: Mauri Pole, MD;  Location: [facility];  Service: Orthopedics;  Laterality: Right;  . FRACTURE SURGERY    .  INGUINAL HERNIA REPAIR Left >3 times  . INGUINAL HERNIA REPAIR Right 2012  . NEPHRECTOMY Left 2000  . TONSILLECTOMY  1940s    Family History  Problem Relation Age of Onset  . Drug abuse Other   . Cancer Other   . Heart disease Other   . Lung disease Other   . Diabetes Mother   . Diabetes Sister   . Colon cancer Neg Hx   . Stomach cancer Neg Hx     No Known Allergies  Current Outpatient Medications on File Prior to Visit  Medication Sig Dispense Refill  . atorvastatin (LIPITOR) 40 MG tablet Take 1 tablet (40 mg total) by mouth daily at 6 PM. 30 tablet 3  . Bioflavonoid Products (VITAMIN C) CHEW Chew 1 tablet by mouth daily.     . cyanocobalamin 1000 MCG tablet Take 5,000 mcg by mouth every other day.     . diclofenac sodium (VOLTAREN) 1 % GEL Apply 4 g topically 4 (four) times daily. 150 g 1  . EPINEPHrine (EPIPEN 2-PAK) 0.3 mg/0.3 mL DEVI Inject 0.3 mg into the muscle daily as needed (allergic reaction).     Marland Kitchen glipiZIDE (GLUCOTROL) 5 MG tablet TAKE 1 TABLET BY MOUTH 2  TIMES DAILY BEFORE A MEAL 180 tablet 3  . insulin NPH Human (NOVOLIN N RELION) 100 UNIT/ML injection Inject 14 units at bedtime under skin 10 mL 11  . Insulin  Syringes, Disposable, U-100 0.5 ML MISC Use 1x a day 100 each 3  . lisinopril (PRINIVIL,ZESTRIL) 5 MG tablet Take 1 tablet (5 mg total) by mouth daily. 30 tablet 3  . metFORMIN (GLUCOPHAGE) 1000 MG tablet TAKE 1 TABLET BY MOUTH  TWICE DAILY WITH MEALS 180 tablet 3  . metoprolol succinate (TOPROL-XL) 50 MG 24 hr tablet Take 50 mg by mouth daily.     . Multiple Vitamins-Minerals (ICAPS PO) Take 1 tablet by mouth daily.    Marland Kitchen neomycin-polymyxin-dexameth (MAXITROL) 0.1 % OINT Place 1 application into the left eye 2 (two) times daily.    . nitroGLYCERIN (NITROSTAT) 0.4 MG SL tablet Place 1 tablet (0.4 mg total) under the tongue every 5 (five) minutes as needed for chest pain. 25 tablet 1  . ONETOUCH DELICA LANCETS FINE MISC Use to check sugar daily 100 each 5  . ONETOUCH VERIO test strip USE 1 STRIP TO CHECK GLUCOSE ONCE DAILY 50 each 11  . sodium chloride (OCEAN) 0.65 % SOLN nasal spray Place 2 sprays into both nostrils 3 (three) times daily. 1 Bottle 0  . vitamin E 400 UNIT capsule Take 400 Units by mouth daily.     No current facility-administered medications on file prior to visit.    BP 132/70   Pulse 87   Temp (!) 97.2 F (36.2 C) (Other (Comment))   Ht 5' 10.75" (1.797 m)   Wt 244 lb (110.7 kg)   SpO2 98%   BMI 34.27 kg/m       Objective:   Physical Exam Vitals and nursing note reviewed.  Constitutional:      General: He is not in acute distress.    Appearance: Normal appearance. He is well-developed. He is obese. He is not diaphoretic.  HENT:     Head: Normocephalic and atraumatic.     Right Ear: Tympanic membrane, ear canal and external ear normal. There is impacted cerumen.     Left Ear: Tympanic membrane, ear canal and external ear normal. There is impacted cerumen.     Nose: Nose  normal. No congestion or rhinorrhea.     Mouth/Throat:     Mouth: Mucous membranes are moist.     Pharynx: Oropharynx is clear. No oropharyngeal exudate or posterior oropharyngeal erythema.    Eyes:     General:        Right eye: No discharge.        Left eye: No discharge.     Extraocular Movements: Extraocular movements intact.     Conjunctiva/sclera: Conjunctivae normal.     Pupils: Pupils are equal, round, and reactive to light.  Neck:     Thyroid: No thyromegaly.     Trachea: No tracheal deviation.  Cardiovascular:     Rate and Rhythm: Normal rate and regular rhythm.     Pulses: Normal pulses.     Heart sounds: Normal heart sounds. No murmur. No friction rub. No gallop.   Pulmonary:     Effort: Pulmonary effort is normal. No respiratory distress.     Breath sounds: Normal breath sounds. No stridor. No wheezing, rhonchi or rales.  Chest:     Chest wall: No tenderness.  Abdominal:     General: Abdomen is flat. Bowel sounds are normal. There is no distension.     Palpations: Abdomen is soft. There is no mass.     Tenderness: There is no abdominal tenderness. There is no right CVA tenderness, left CVA tenderness, guarding or rebound.     Hernia: A hernia is present. Hernia is present in the ventral area.  Musculoskeletal:        General: No swelling, tenderness, deformity or signs of injury. Normal range of motion.     Right lower leg: No edema.     Left lower leg: No edema.  Lymphadenopathy:     Cervical: No cervical adenopathy.  Skin:    General: Skin is warm and dry.     Capillary Refill: Capillary refill takes less than 2 seconds.     Coloration: Skin is not jaundiced or pale.     Findings: No bruising, erythema, lesion or rash.  Neurological:     General: No focal deficit present.     Mental Status: He is alert and oriented to person, place, and time.     Cranial Nerves: No cranial nerve deficit.     Sensory: No sensory deficit.     Motor: No weakness.     Coordination: Coordination normal.     Gait: Gait normal.     Deep Tendon Reflexes: Reflexes normal.  Psychiatric:        Mood and Affect: Mood normal.        Behavior: Behavior normal.         Thought Content: Thought content normal.        Judgment: Judgment normal.       Assessment & Plan:  1. Routine general medical examination at a health care facility - Encouraged to lose weight through lifestyle modifications  - CBC with Differential/Platelet - Comprehensive metabolic panel - Lipid panel - TSH  2. Essential hypertension - No change in medication.  - CBC with Differential/Platelet - Comprehensive metabolic panel - Lipid panel - TSH  3. 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[place]) - Follow up with Endocrinology  - Encouraged weight loss through heart healthy diet and exercise - CBC with Differential/Platelet - Comprehensive metabolic panel - Lipid panel - TSH  4. Other polyneuropathy  - Vitamin B12  5. Vitamin B12 deficiency   6. Hyperlipidemia, unspecified hyperlipidemia  type - Consider dose change of lipitor  - CBC with Differential/Platelet - Comprehensive metabolic panel - Lipid panel - TSH  7. B12 deficiency  - Vitamin B12  8. Bilateral impacted cerumen Warm water was applied and gentle ear lavage performed  bilaterally ear. There were no complications and following the disimpaction the tympanic membrane were visible . Tympanic membranes are intact following the procedure.  Auditory canals are normal.  The patient reported relief of symptoms after removal of cerumen. Patient tolerated procedure well.     [name], NP   This visit occurred during the SARS-CoV-2 public health emergency.  Safety protocols were in place, including screening questions prior to the visit, additional usage of staff PPE, and extensive cleaning of exam room while observing appropriate contact time as indicated for disinfecting solutions.

## 2019-10-08 NOTE — Patient Instructions (Signed)
It was great seeing you today   We will follow up with you regarding your blood work   I have sent in a medication called Myrbetriq to help with urinary issues. This went to [facility]

## 2019-10-12 ENCOUNTER — Ambulatory Visit (INDEPENDENT_AMBULATORY_CARE_PROVIDER_SITE_OTHER): Payer: Medicare Other

## 2019-10-12 VITALS — BP 132/70 | Ht 71.0 in | Wt 244.0 lb

## 2019-10-12 DIAGNOSIS — Z0000 Encounter for general adult medical examination without abnormal findings: Secondary | ICD-10-CM | POA: Diagnosis not present

## 2019-10-12 NOTE — Progress Notes (Signed)
This visit is being conducted via phone call due to the COVID-19 pandemic. This patient has given me verbal consent via phone to conduct this visit, patient states they are participating from their home address. Some vital signs may be absent or patient reported.   Patient identification: identified by name, DOB, and current address.  Location provider: [facility], Office Persons participating in the virtual visit: Mr. Krystopher "Clair Gulling" Bartholomew Crews and Franne Forts, LPN.    Subjective:   COURAGE RUBY is a 79 y.o. male who presents for Medicare Annual/Subsequent preventive examination.  Mr. Ruby is doing well at this time. He does express that his adult children have told him that he is not hearing as well as he used to. He also expresses an interest in losing about 15 pounds.   Review of Systems:  No ROS; Annual Medicare Wellness subsequent visit. Cardiac Risk Factors include: dyslipidemia;hypertension;male gender;advanced age (>61men, >56 women);obesity (BMI >30kg/m2)    Objective:    Vitals: BP 132/70 Comment: patient reported  Ht 5\' 11"  (1.803 m)   Wt 244 lb (110.7 kg)   BMI 34.03 kg/m   Body mass index is 34.03 kg/m.  Advanced Directives 10/12/2019 09/28/2018 06/09/2018 06/06/2017 11/21/2015 10/18/2015 07/26/2013  Does Patient Have a Medical Advance Directive? Yes No Yes Yes No No Patient has advance directive, copy not in chart  Type of Advance Directive [place];Living will - - - - - Press photographer;Living will  Does patient want to make changes to medical advance directive? No - Patient declined - - - - - -  Copy of [place] in Chart? No - copy requested - - - - - Copy requested from family  Would patient like information on creating a medical advance directive? - No - Patient declined - - No - patient declined information No - patient declined information -  Pre-existing out of facility DNR order (yellow form or pink MOST form) - - - - -  - -    Tobacco Social History   Tobacco Use  Smoking Status Former Smoker  . Years: 20.00  . Types: Cigarettes, Pipe, Cigars  . Quit date: 09/09/1968  . Years since quitting: 51.1  Smokeless Tobacco Never Used  Tobacco Comment   smoking cigars- quit in 13      Counseling given: Not Answered Comment: smoking cigars- quit in 13    Clinical Intake:  Pre-visit preparation completed: Yes        BMI - recorded: 34.03 Nutritional Status: BMI > 30  Obese Nutritional Risks: None Diabetes: Yes CBG done?: Yes(at home 152) CBG resulted in Enter/ Edit results?: Yes Did pt. bring in CBG monitor from home?: (N/A telephone visit)  How often do you need to have someone help you when you read instructions, pamphlets, or other written materials from your doctor or pharmacy?: 1 - Never  Interpreter Needed?: No  Information entered by :: Franne Forts, LPN.  Past Medical History:  Diagnosis Date  . Anemia    "when I was a lad"  . Arthritis    "right femur" (10/19/2015)  . AVM (arteriovenous malformation) of colon    2 - non-bleeding 2013  . Biceps muscle tear    right  . Cancer of kidney ([place]) 2000   left nephrectomy  . Coronary artery disease   . Eczema   . GERD (gastroesophageal reflux disease)   . Hernia, ventral   . Hyperlipidemia   . Hypertension   .  Obesity   . Personal history of adenomatous colonic polyps 07/20/2012   3 + adenomas 2009 07/20/2012 2 diminutive polyps    . Pneumonia 1942; 1950s X 1  . Type II diabetes mellitus ([place])   . Walking pneumonia 2000's X 1   Past Surgical History:  Procedure Laterality Date  . CARDIAC CATHETERIZATION N/A 10/19/2015   Procedure: Left Heart Cath and Coronary Angiography;  Surgeon: Charolette Forward, MD;  Location: [facility];  Service: Cardiovascular;  Laterality: N/A;  . CARDIAC CATHETERIZATION N/A 10/19/2015   Procedure: Coronary Stent Intervention;  Surgeon: Charolette Forward, MD;  Location: [facility];  Service:  Cardiovascular;  Laterality: N/A;  . CATARACT EXTRACTION, BILATERAL Bilateral 01/2018   Dr. Kathrin Penner did surgery and he developed MD and is seeing specialist now for MD treatment  . COLONOSCOPY     "nothing showed up this time"  . COLONOSCOPY W/ BIOPSIES AND POLYPECTOMY  X 1  . CORONARY ANGIOPLASTY    . FEMUR IM NAIL Right 07/26/2013   Procedure: INTRAMEDULLARY (IM) NAIL FEMORAL subtrochanteric;  Surgeon: Mauri Pole, MD;  Location: [place];  Service: Orthopedics;  Laterality: Right;  . FRACTURE SURGERY    . INGUINAL HERNIA REPAIR Left >3 times  . INGUINAL HERNIA REPAIR Right 2012  . NEPHRECTOMY Left 2000  . TONSILLECTOMY  1940s   Family History  Problem Relation Age of Onset  . Drug abuse Other   . Cancer Other   . Heart disease Other   . Lung disease Other   . Diabetes Mother   . Diabetes Sister   . Colon cancer Neg Hx   . Stomach cancer Neg Hx    Social History   Socioeconomic History  . Marital status: Widowed    Spouse name: Not on file  . Number of children: 3  . Years of education: Not on file  . Highest education level: Not on file  Occupational History  . Occupation: retired  Tobacco Use  . Smoking status: Former Smoker    Years: 20.00    Types: Cigarettes, Pipe, Cigars    Quit date: 09/09/1968    Years since quitting: 51.1  . Smokeless tobacco: Never Used  . Tobacco comment: smoking cigars- quit in 13   Substance and Sexual Activity  . Alcohol use: Yes    Alcohol/week: 1.0 standard drinks    Types: 1 Cans of beer per week    Comment: occasionally   . Drug use: No  . Sexual activity: Never  Other Topics Concern  . 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     widowed    Three children, One in [place], one in [place] one in [place] - 2 : youngest daughter lives with him   Social Determinants of Health   Financial  Resource Strain: Low Risk   . Difficulty of Paying Living Expenses: Not hard at all  Food Insecurity:   . Worried About Charity fundraiser in the Last Year: Not on file  . Ran Out of Food in the Last Year: Not on file  Transportation Needs: No Transportation Needs  . Lack of Transportation (Medical): No  . Lack of Transportation (Non-Medical): No  Physical Activity: Sufficiently Active  . Days of Exercise per Week: 5 days  . Minutes of Exercise per Session: 50  min  Stress: Stress Concern Present  . Feeling of Stress : To some extent  Social Connections: Slightly Isolated  . Frequency of Communication with Friends and Family: More than three times a week  . Frequency of Social Gatherings with Friends and Family: More than three times a week  . Attends Religious Services: More than 4 times per year  . Active Member of Clubs or Organizations: Yes  . Attends Archivist Meetings: More than 4 times per year  . Marital Status: Widowed    Outpatient Encounter Medications as of 10/12/2019  Medication Sig  . atorvastatin (LIPITOR) 40 MG tablet Take 1 tablet (40 mg total) by mouth daily at 6 PM.  . Bioflavonoid Products (VITAMIN C) CHEW Chew 1 tablet by mouth daily.   . cyanocobalamin 1000 MCG tablet Take 5,000 mcg by mouth every other day.   . diclofenac sodium (VOLTAREN) 1 % GEL Apply 4 g topically 4 (four) times daily.  Marland Kitchen EPINEPHrine (EPIPEN 2-PAK) 0.3 mg/0.3 mL DEVI Inject 0.3 mg into the muscle daily as needed (allergic reaction).   Marland Kitchen glipiZIDE (GLUCOTROL) 5 MG tablet TAKE 1 TABLET BY MOUTH 2  TIMES DAILY BEFORE A MEAL  . insulin NPH Human (NOVOLIN N RELION) 100 UNIT/ML injection Inject 14 units at bedtime under skin  . Insulin Syringes, Disposable, U-100 0.5 ML MISC Use 1x a day  . lisinopril (PRINIVIL,ZESTRIL) 5 MG tablet Take 1 tablet (5 mg total) by mouth daily.  . metFORMIN (GLUCOPHAGE) 1000 MG tablet TAKE 1 TABLET BY MOUTH  TWICE DAILY WITH MEALS  . metoprolol succinate  (TOPROL-XL) 50 MG 24 hr tablet Take 50 mg by mouth daily.   . mirabegron ER (MYRBETRIQ) 25 MG TB24 tablet Take 1 tablet (25 mg total) by mouth daily.  . Multiple Vitamins-Minerals (ICAPS PO) Take 1 tablet by mouth daily.  Marland Kitchen neomycin-polymyxin-dexameth (MAXITROL) 0.1 % OINT Place 1 application into the left eye 2 (two) times daily.  . nitroGLYCERIN (NITROSTAT) 0.4 MG SL tablet Place 1 tablet (0.4 mg total) under the tongue every 5 (five) minutes as needed for chest pain.  Glory Rosebush DELICA LANCETS FINE MISC Use to check sugar daily  . ONETOUCH VERIO test strip USE 1 STRIP TO CHECK GLUCOSE ONCE DAILY  . sodium chloride (OCEAN) 0.65 % SOLN nasal spray Place 2 sprays into both nostrils 3 (three) times daily.  . vitamin E 400 UNIT capsule Take 400 Units by mouth daily.   No facility-administered encounter medications on file as of 10/12/2019.    Activities of Daily Living In your present state of health, do you have any difficulty performing the following activities: 10/12/2019 10/08/2019  Hearing? Tempie Donning  Vision? N N  Difficulty concentrating or making decisions? N N  Walking or climbing stairs? N N  Dressing or bathing? N N  Doing errands, shopping? N N  Preparing Food and eating ? N -  Using the Toilet? N -  In the past six months, have you accidently leaked urine? Y -  Do you have problems with loss of bowel control? N -  Managing your Medications? N -  Managing your Finances? N -  Housekeeping or managing your Housekeeping? N -  Some recent data might be hidden    Patient Care Team: [name], NP as PCP - General (Family Medicine) Melida Quitter, MD as Consulting Physician (Otolaryngology) Charolette Forward, MD as Consulting Physician (Cardiology) Philemon Kingdom, MD as Consulting Physician (Internal Medicine)   Assessment:   This is  a routine wellness examination for [place].  Exercise Activities and Dietary recommendations Current Exercise Habits: Structured exercise class, Type of  exercise: walking, Time (Minutes): 50, Frequency (Times/Week): 5, Weekly Exercise (Minutes/Week): 250, Intensity: Moderate, Exercise limited by: None identified  Goals    . DIET - INCREASE WATER INTAKE    . Weight (lb) < 230 lb (104.3 kg)     Watch your portion sizes        Fall Risk Fall Risk  10/12/2019 10/08/2019 08/19/2018 06/09/2018 08/13/2017  Falls in the past year? 1 1 0 No No  Comment - - - - -  Number falls in past yr: 0 0 - - -  Injury with Fall? 0 1 - - -  Comment - Pt hurt arm - - -  Risk for fall due to : History of fall(s);Medication side effect No Fall Risks - - -  Risk for fall due to: Comment - - - - -  Follow up Falls evaluation completed;Education provided;Falls prevention discussed Falls evaluation completed - - -   Is the patient's home free of loose throw rugs in walkways, pet beds, electrical cords, etc?   yes      Grab bars in the bathroom? yes      Handrails on the stairs?   yes      Adequate lighting?   yes  Timed Get Up and Go Performed: N/A due to telephone visit  Depression Screen PHQ 2/9 Scores 10/12/2019 10/08/2019 08/19/2018 06/09/2018  PHQ - 2 Score 0 0 0 0    Cognitive Function MMSE - Mini Mental State Exam 06/09/2018 06/06/2017  Not completed: (No Data) (No Data)     6CIT Screen 10/12/2019  What Year? 0 points  What month? 0 points  What time? 0 points  Count back from 20 0 points  Months in reverse 0 points  Repeat phrase 0 points  Total Score 0    Immunization History  Administered Date(s) Administered  . Fluad Quad(high Dose 65+) 07/05/2019  . Influenza Split 07/18/2011, 07/06/2012, 07/07/2012  . Influenza Whole 06/09/2006, 06/17/2008, 07/10/2009, 05/18/2010  . Influenza, High Dose Seasonal PF 06/11/2016, 06/06/2017, 05/26/2018  . Influenza,inj,Quad PF,6+ Mos 06/14/2013, 06/02/2014, 07/14/2015  . Pneumococcal Conjugate-13 07/14/2014  . Pneumococcal Polysaccharide-23 06/09/2006  . Td 09/09/2001  . Tdap 02/20/2012    Qualifies for  Shingles Vaccine? Yes  Screening Tests Health Maintenance  Topic Date Due  . FOOT EXAM  08/13/2018  . OPHTHALMOLOGY EXAM  11/23/2019  . HEMOGLOBIN A1C  02/17/2020  . TETANUS/TDAP  02/19/2022  . INFLUENZA VACCINE  Completed  . PNA vac Low Risk Adult  Completed  . COLONOSCOPY  Discontinued   Cancer Screenings: Lung: Low Dose CT Chest recommended if Age 27-80 years, 30 pack-year currently smoking OR have quit w/in 15years. Patient does not qualify. Colorectal: Yes  Additional Screenings:  Hepatitis C Screening:N/A      Plan:   Mr. Sergio understands that he needs to have one more colonoscopy. This has been due since 2018 and has hx of colon polyps. He may obtain the shingrix vaccines after he completes the covid vaccines as well. Audiology provider list was mailed to patient as well.   I have personally reviewed and noted the following in the patient's chart:   . Medical and social history . Use of alcohol, tobacco or illicit drugs  . Current medications and supplements . Functional ability and status . Nutritional status . Physical activity . Advanced directives . List of other physicians .  Hospitalizations, surgeries, and ER visits in previous 12 months . Vitals . Screenings to include cognitive, depression, and falls . Referrals and appointments  In addition, I have reviewed and discussed with patient certain preventive protocols, quality metrics, and best practice recommendations. A written personalized care plan for preventive services as well as general preventive health recommendations were provided to patient.     Franne Forts, LPN  [id number]

## 2019-10-12 NOTE — Patient Instructions (Addendum)
Charles [name] , Thank you for taking time to participate in your Medicare Wellness Visit. I appreciate your ongoing commitment to your health goals. Please review the following plan we discussed and let me know if I can assist you in the future.   Screening recommendations/referrals: Colorectal Screening: last colonoscopy completed 07/20/2012; was due 07/20/2017; Recommended patient get this done soon in hopes that this will be the last one.   Vision and Dental Exams: Recommended annual ophthalmology exams for early detection of glaucoma and other disorders of the eye. Patient states he sees eye provider every 6 weeks for injections. Recommended annual dental exams for proper oral hygiene. Patient reports he sees his dentist four times a year.  Diabetic Exams: Diabetic Eye Exam: completed 11/23/2018.  Diabetic Foot Exam: past due. This can be done by PCP or Endocrinology.  Vaccinations: Influenza vaccine: completed 07/05/2019. Due again in Fall 2021. Pneumococcal vaccine: completed 06/09/2006 and 07/14/2014.  Tdap vaccine: completed 07/05/2019. Due again 07/04/2029. Shingles vaccine: Please call your pharmacy to determine your out of pocket expense for the Shingrix vaccine. You may receive this vaccine at your local pharmacy. Medicare does not cover this.   Advanced directives: Advance directives discussed with you today.  Please bring a copy of your POA (Power of [place]) and/or Living Will to your next appointment.  Goals: Recommend to drink at least 6-8 8oz glasses of water per day.  Continue to exercise for at least 150 minutes per week.  Recommend to remove any items from the home that may cause slips or trips.  Recommend to decrease portion sizes by eating 3 small healthy meals and at least 2 healthy snacks per day.  Next appointment: Please schedule your Annual Wellness Visit with your Nurse Health Advisor in one year.  Preventive Care 7 Years and Older, Male Preventive care  refers to lifestyle choices and visits with your health care provider that can promote health and wellness. What does preventive care include?  A yearly physical exam. This is also called an annual well check.  Dental exams once or twice a year.  Routine eye exams. Ask your health care provider how often you should have your eyes checked.  Personal lifestyle choices, including:  Daily care of your teeth and gums.  Regular physical activity.  Eating a healthy diet.  Avoiding tobacco and drug use.  Limiting alcohol use.  Practicing safe sex.  Taking low doses of aspirin every day if recommended by your health care provider..  Taking vitamin and mineral supplements as recommended by your health care provider. What happens during an annual well check? The services and screenings done by your health care provider during your annual well check will depend on your age, overall health, lifestyle risk factors, and family history of disease. Counseling  Your health care provider may ask you questions about your:  Alcohol use.  Tobacco use.  Drug use.  Emotional well-being.  Home and relationship well-being.  Sexual activity.  Eating habits.  History of falls.  Memory and ability to understand (cognition).  Work and work Statistician. Screening  You may have the following tests or measurements:  Height, weight, and BMI.  Blood pressure.  Lipid and cholesterol levels. These may be checked every 5 years, or more frequently if you are over 13 years old.  Skin check.  Lung cancer screening. You may have this screening every year starting at age 71 if you have a 30-pack-year history of smoking and currently smoke or have quit within  the past 15 years.  Fecal occult blood test (FOBT) of the stool. You may have this test every year starting at age 11.  Flexible sigmoidoscopy or colonoscopy. You may have a sigmoidoscopy every 5 years or a colonoscopy every 10 years  starting at age 72.  Prostate cancer screening. Recommendations will vary depending on your family history and other risks.  Hepatitis C blood test.  Hepatitis B blood test.  Sexually transmitted disease (STD) testing.  Diabetes screening. This is done by checking your blood sugar (glucose) after you have not eaten for a while (fasting). You may have this done every 1-3 years.  Abdominal aortic aneurysm (AAA) screening. You may need this if you are a current or former smoker.  Osteoporosis. You may be screened starting at age 108 if you are at high risk. Talk with your health care provider about your test results, treatment options, and if necessary, the need for more tests. Vaccines  Your health care provider may recommend certain vaccines, such as:  Influenza vaccine. This is recommended every year.  Tetanus, diphtheria, and acellular pertussis (Tdap, Td) vaccine. You may need a Td booster every 10 years.  Zoster vaccine. You may need this after age 60.  Pneumococcal 13-valent conjugate (PCV13) vaccine. One dose is recommended after age 75.  Pneumococcal polysaccharide (PPSV23) vaccine. One dose is recommended after age 17. Talk to your health care provider about which screenings and vaccines you need and how often you need them. This information is not intended to replace advice given to you by your health care provider. Make sure you discuss any questions you have with your health care provider. Document Released: 09/22/2015 Document Revised: 05/15/2016 Document Reviewed: 06/27/2015 Elsevier Interactive Patient Education  2017 [place] Prevention in the Home Falls can cause injuries. They can happen to people of all ages. There are many things you can do to make your home safe and to help prevent falls. What can I do on the outside of my home?  Regularly fix the edges of walkways and driveways and fix any cracks.  Remove anything that might make you trip as you  walk through a door, such as a raised step or threshold.  Trim any bushes or trees on the path to your home.  Use bright outdoor lighting.  Clear any walking paths of anything that might make someone trip, such as rocks or tools.  Regularly check to see if handrails are loose or broken. Make sure that both sides of any steps have handrails.  Any raised decks and porches should have guardrails on the edges.  Have any leaves, snow, or ice cleared regularly.  Use sand or salt on walking paths during winter.  Clean up any spills in your garage right away. This includes oil or grease spills. What can I do in the bathroom?  Use night lights.  Install grab bars by the toilet and in the tub and shower. Do not use towel bars as grab bars.  Use non-skid mats or decals in the tub or shower.  If you need to sit down in the shower, use a plastic, non-slip stool.  Keep the floor dry. Clean up any water that spills on the floor as soon as it happens.  Remove soap buildup in the tub or shower regularly.  Attach bath mats securely with double-sided non-slip rug tape.  Do not have throw rugs and other things on the floor that can make you trip. What can I do  in the bedroom?  Use night lights.  Make sure that you have a light by your bed that is easy to reach.  Do not use any sheets or blankets that are too big for your bed. They should not hang down onto the floor.  Have a firm chair that has side arms. You can use this for support while you get dressed.  Do not have throw rugs and other things on the floor that can make you trip. What can I do in the kitchen?  Clean up any spills right away.  Avoid walking on wet floors.  Keep items that you use a lot in easy-to-reach places.  If you need to reach something above you, use a strong step stool that has a grab bar.  Keep electrical cords out of the way.  Do not use floor polish or wax that makes floors slippery. If you must use  wax, use non-skid floor wax.  Do not have throw rugs and other things on the floor that can make you trip. What can I do with my stairs?  Do not leave any items on the stairs.  Make sure that there are handrails on both sides of the stairs and use them. Fix handrails that are broken or loose. Make sure that handrails are as long as the stairways.  Check any carpeting to make sure that it is firmly attached to the stairs. Fix any carpet that is loose or worn.  Avoid having throw rugs at the top or bottom of the stairs. If you do have throw rugs, attach them to the floor with carpet tape.  Make sure that you have a light switch at the top of the stairs and the bottom of the stairs. If you do not have them, ask someone to add them for you. What else can I do to help prevent falls?  Wear shoes that:  Do not have high heels.  Have rubber bottoms.  Are comfortable and fit you well.  Are closed at the toe. Do not wear sandals.  If you use a stepladder:  Make sure that it is fully opened. Do not climb a closed stepladder.  Make sure that both sides of the stepladder are locked into place.  Ask someone to hold it for you, if possible.  Clearly mark and make sure that you can see:  Any grab bars or handrails.  First and last steps.  Where the edge of each step is.  Use tools that help you move around (mobility aids) if they are needed. These include:  Canes.  Walkers.  Scooters.  Crutches.  Turn on the lights when you go into a dark area. Replace any light bulbs as soon as they burn out.  Set up your furniture so you have a clear path. Avoid moving your furniture around.  If any of your floors are uneven, fix them.  If there are any pets around you, be aware of where they are.  Review your medicines with your doctor. Some medicines can make you feel dizzy. This can increase your chance of falling. Ask your doctor what other things that you can do to help prevent  falls. This information is not intended to replace advice given to you by your health care provider. Make sure you discuss any questions you have with your health care provider. Document Released: 06/22/2009 Document Revised: 02/01/2016 Document Reviewed: 09/30/2014 Elsevier Interactive Patient Education  2017 [employer].

## 2019-10-14 ENCOUNTER — Encounter: Payer: Self-pay | Admitting: Adult Health

## 2019-10-14 DIAGNOSIS — I2510 Atherosclerotic heart disease of native coronary artery without angina pectoris: Secondary | ICD-10-CM | POA: Diagnosis not present

## 2019-10-14 DIAGNOSIS — E119 Type 2 diabetes mellitus without complications: Secondary | ICD-10-CM | POA: Diagnosis not present

## 2019-10-14 DIAGNOSIS — I10 Essential (primary) hypertension: Secondary | ICD-10-CM | POA: Diagnosis not present

## 2019-10-14 DIAGNOSIS — M1990 Unspecified osteoarthritis, unspecified site: Secondary | ICD-10-CM | POA: Diagnosis not present

## 2019-10-14 DIAGNOSIS — E785 Hyperlipidemia, unspecified: Secondary | ICD-10-CM | POA: Diagnosis not present

## 2019-10-15 NOTE — Telephone Encounter (Signed)
Please advise 

## 2019-10-19 ENCOUNTER — Other Ambulatory Visit: Payer: Self-pay

## 2019-10-19 ENCOUNTER — Telehealth (INDEPENDENT_AMBULATORY_CARE_PROVIDER_SITE_OTHER): Payer: Medicare Other | Admitting: Adult Health

## 2019-10-19 DIAGNOSIS — N401 Enlarged prostate with lower urinary tract symptoms: Secondary | ICD-10-CM

## 2019-10-19 DIAGNOSIS — R3912 Poor urinary stream: Secondary | ICD-10-CM | POA: Diagnosis not present

## 2019-10-19 MED ORDER — TAMSULOSIN HCL 0.4 MG PO CAPS: 0.4000 mg | ORAL_CAPSULE | Freq: Every day | ORAL | 3 refills | Status: DC

## 2019-10-19 NOTE — Progress Notes (Signed)
Virtual Visit via Telephone Note  I connected with [name] on 10/19/19 at  3:30 PM EST by telephone and verified that I am speaking with the correct person using two identifiers.   I discussed the limitations, risks, security and privacy concerns of performing an evaluation and management service by telephone and the availability of in person appointments. I also discussed with the patient that there may be a patient responsible charge related to this service. The patient expressed understanding and agreed to proceed.  Location patient: home Location provider: work or home office Participants present for the call: patient, provider Patient did not have a visit in the prior 7 days to address this/these issue(s).   History of Present Illness: 79 year old male who  has a past medical history of Anemia, Arthritis, AVM (arteriovenous malformation) of colon, Biceps muscle tear, Cancer of kidney ([place]) (2000), Coronary artery disease, Eczema, GERD (gastroesophageal reflux disease), Hernia, ventral, Hyperlipidemia, Hypertension, Obesity, Personal history of adenomatous colonic polyps (07/20/2012), Pneumonia (1942; 1950s X 1), Type II diabetes mellitus ([place]), and Walking pneumonia (2000's X 1).  He is being evaluated by concern of BPH. He reports that he has noticed that he has decreased stream, nocturia 3 times a night, incomplete bladder emptying, and urinary frequency.  He denies hematuria, dysuria, fevers, or chills.   Observations/Objective: Patient sounds cheerful and well on the phone. I do not appreciate any SOB. Speech and thought processing are grossly intact. Patient reported vitals:  Assessment and Plan: 1. Benign prostatic hyperplasia with weak urinary stream -We will trial him on Flomax 0.4 mg daily.  Advised that it may take up to 2 weeks to fully work.  Follow-up if symptoms have not improved - tamsulosin (FLOMAX) 0.4 MG CAPS capsule; Take 1 capsule (0.4 mg total) by mouth  daily.  Dispense: 30 capsule; Refill: 3    Follow Up Instructions:  I did not refer this patient for an OV in the next 24 hours for this/these issue(s).  I discussed the assessment and treatment plan with the patient. The patient was provided an opportunity to ask questions and all were answered. The patient agreed with the plan and demonstrated an understanding of the instructions.   The patient was advised to call back or seek an in-person evaluation if the symptoms worsen or if the condition fails to improve as anticipated.  I provided 15  minutes of non-face-to-face time during this encounter.   [name], NP

## 2019-11-04 DIAGNOSIS — H34811 Central retinal vein occlusion, right eye, with macular edema: Secondary | ICD-10-CM | POA: Diagnosis not present

## 2019-12-20 ENCOUNTER — Other Ambulatory Visit: Payer: Self-pay

## 2019-12-22 ENCOUNTER — Encounter: Payer: Self-pay | Admitting: Internal Medicine

## 2019-12-22 ENCOUNTER — Other Ambulatory Visit: Payer: Self-pay

## 2019-12-22 ENCOUNTER — Ambulatory Visit: Payer: Medicare Other | Admitting: Internal Medicine

## 2019-12-22 VITALS — BP 118/82 | HR 86 | Ht 71.0 in | Wt 249.0 lb

## 2019-12-22 DIAGNOSIS — E1165 Type 2 diabetes mellitus with hyperglycemia: Secondary | ICD-10-CM

## 2019-12-22 DIAGNOSIS — G6289 Other specified polyneuropathies: Secondary | ICD-10-CM | POA: Diagnosis not present

## 2019-12-22 DIAGNOSIS — E1159 Type 2 diabetes mellitus with other circulatory complications: Secondary | ICD-10-CM | POA: Diagnosis not present

## 2019-12-22 DIAGNOSIS — E785 Hyperlipidemia, unspecified: Secondary | ICD-10-CM | POA: Diagnosis not present

## 2019-12-22 DIAGNOSIS — E538 Deficiency of other specified B group vitamins: Secondary | ICD-10-CM

## 2019-12-22 LAB — POCT GLYCOSYLATED HEMOGLOBIN (HGB A1C): Hemoglobin A1C: 7.4 % — AB (ref 4.0–5.6)

## 2019-12-22 LAB — VITAMIN B12: Vitamin B-12: 748 pg/mL (ref 211–911)

## 2019-12-22 NOTE — Patient Instructions (Addendum)
Please continue: - Metformin 1000 mg 2x a day with meals - Glipizide 10 mg 2x a day before meals  Please check sugars later in the day, also.  Limit calories at night. Ideally, do not eat when it is dark outside.  Please return in 3-4 months with your sugar log.

## 2019-12-22 NOTE — Progress Notes (Signed)
Patient ID: [name], male   DOB: [birth year], 79 y.o.   MRN: [id number]  This visit occurred during the SARS-CoV-2 public health emergency.  Safety protocols were in place, including screening questions prior to the visit, additional usage of staff PPE, and extensive cleaning of exam room while observing appropriate contact time as indicated for disinfecting solutions.   HPI: Charles [name] is a 79 y.o.-year-old male, returning for f/u for DM2 dx 2008, insulin-dependent, uncontrolled, with complications (CAD - s/p NSTEMI, PN, mild CKD). Last visit 4 months ago.  DM2: Reviewed HbA1c levels: Lab Results  Component Value Date   HGBA1C 8.0 (A) 08/19/2019   HGBA1C 7.6 (A) 05/06/2019   HGBA1C 7.8 (A) 12/24/2018  12/2013: 8.6%  Pt is on a regimen of: - Metformin 1000 mg 2x a day >> 2000 mg with dinner (but sugars worse) >> 1 mg 2x a day - Glipizide 10 mg 2x a day before meals At last OV, I recommended  NPH 14 units at bedtime - did not start! We tried Rybelsus 3 >> 6 mg daily-added 12/2018 >> $$$ >> stopped 01/2019 He checked with his insurance and none of the SGLT2 inhibitors are covered for him.  Pt checks his sugars once a day: - am:   117, 147-186, 205 >> 148, 158-198, 220 >> 112, 120-220, 233 - 2h after b'fast: 68x1 >> n/c >> 201, 206 >> n/c - before lunch:  n/c >> 69, 79 >> n/c  - 2h after lunch: 123-176 >> n/c - before dinner  n/c >> 160s >> n/c - 2h after dinner: n/c >> 240 >> n/c - bedtime: 110-150 >> 160s >> n/c Lowest sugar was  90s >> 117 >> 147 >> 112; he has hypoglycemia awareness in the 80s. Highest sugar was 205 (missed medication) >> 220.  -+ Mild CKD, last BUN/creatinine:  Lab Results  Component Value Date   BUN 15 10/08/2019   CREATININE 1.02 10/08/2019  Of note, he has a history of nephrectomy. On lisinopril 10.  -+ HL; last set of lipids: Lab Results  Component Value Date   CHOL 127 10/08/2019   HDL 46.10 10/08/2019   LDLCALC 55 10/08/2019   LDLDIRECT  139.8 07/08/2014   TRIG 130.0 10/08/2019   CHOLHDL 3 10/08/2019  On Lipitor 40. - last eye exam was in 11/2018: No DR; now seeing Dr. [place] - retinal vein occlusion, + cataracts, + macular "hole".  He has a history of eye surgeries.  He continues intraocular injections. -+ Improved numbness and tingling in his feet  B12 deficiency:  He was initially on B12 injections, currently on 5000 mcg q3 days, changed by PCP in 09/2019: Lab Results  Component Value Date   VITAMINB12 1,033 (H) 10/08/2019   VITAMINB12 1,172 (H) 08/19/2018   VITAMINB12 >1500 (H) 05/04/2018   VITAMINB12 1,249 (H) 02/18/2017   VITAMINB12 234 10/22/2016   VITAMINB12 217 04/19/2016   VITAMINB12 94 (L) 10/13/2015   He was admitted with NSTEMI 10/18/2015.  He had stents placed then.  He was going to the gym 5 out of 7 days, but then stopped during the coronavirus pandemic.  Started Tamsulozin.  ROS: Constitutional: + Weight gain/+ weight loss, no fatigue, no subjective hyperthermia, no subjective hypothermia Eyes: no blurry vision, no xerophthalmia ENT: no sore throat, no nodules palpated in neck, no dysphagia, no odynophagia, no hoarseness Cardiovascular: no CP/no SOB/no palpitations/no leg swelling Respiratory: no cough/no SOB/no wheezing Gastrointestinal: no N/no V/no D/no C/no acid reflux Musculoskeletal: no muscle  aches/no joint aches Skin: no rashes, no hair loss Neurological: no tremors/no numbness/no tingling/no dizziness  I reviewed pt's medications, allergies, PMH, social hx, family hx, and changes were documented in the history of present illness. Otherwise, unchanged from my initial visit note.  Past Medical History:  Diagnosis Date  . Anemia    "when I was a lad"  . Arthritis    "right femur" (10/19/2015)  . AVM (arteriovenous malformation) of colon    2 - non-bleeding 2013  . Biceps muscle tear    right  . Cancer of kidney ([place]) 2000   left nephrectomy  . Coronary artery disease   . Eczema    . GERD (gastroesophageal reflux disease)   . Hernia, ventral   . Hyperlipidemia   . Hypertension   . Obesity   . Personal history of adenomatous colonic polyps 07/20/2012   3 + adenomas 2009 07/20/2012 2 diminutive polyps    . Pneumonia 1942; 1950s X 1  . Type II diabetes mellitus ([place])   . Walking pneumonia 2000's X 1   Past Surgical History:  Procedure Laterality Date  . CARDIAC CATHETERIZATION N/A 10/19/2015   Procedure: Left Heart Cath and Coronary Angiography;  Surgeon: Charolette Forward, MD;  Location: [facility];  Service: Cardiovascular;  Laterality: N/A;  . CARDIAC CATHETERIZATION N/A 10/19/2015   Procedure: Coronary Stent Intervention;  Surgeon: Charolette Forward, MD;  Location: [facility];  Service: Cardiovascular;  Laterality: N/A;  . CATARACT EXTRACTION, BILATERAL Bilateral 01/2018   Dr. Kathrin Penner did surgery and he developed MD and is seeing specialist now for MD treatment  . COLONOSCOPY     "nothing showed up this time"  . COLONOSCOPY W/ BIOPSIES AND POLYPECTOMY  X 1  . CORONARY ANGIOPLASTY    . FEMUR IM NAIL Right 07/26/2013   Procedure: INTRAMEDULLARY (IM) NAIL FEMORAL subtrochanteric;  Surgeon: Mauri Pole, MD;  Location: [facility];  Service: Orthopedics;  Laterality: Right;  . FRACTURE SURGERY    . INGUINAL HERNIA REPAIR Left >3 times  . INGUINAL HERNIA REPAIR Right 2012  . NEPHRECTOMY Left 2000  . TONSILLECTOMY  1940s   Social History   Socioeconomic History  . Marital status: Widowed    Spouse name: Not on file  . Number of children: 3  . Years of education: Not on file  . Highest education level: Not on file  Occupational History  . Occupation: retired  Tobacco Use  . Smoking status: Former Smoker    Years: 20.00    Types: Cigarettes, Pipe, Cigars    Quit date: 09/09/1968    Years since quitting: 51.3  . Smokeless tobacco: Never Used  . Tobacco comment: smoking cigars- quit in 13   Substance and Sexual Activity  . Alcohol use: Yes     Alcohol/week: 1.0 standard drinks    Types: 1 Cans of beer per week    Comment: occasionally   . Drug use: No  . Sexual activity: Never  Other Topics Concern  . 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     widowed    Three children, One in [place], one in [place] one in [place] - 2 : youngest daughter lives with him   Social Determinants of Health   Financial Resource Strain: Low Risk   . Difficulty of Paying Living  Expenses: Not hard at all  Food Insecurity:   . Worried About Charity fundraiser in the Last Year:   . Arboriculturist in the Last Year:   Transportation Needs: No Transportation Needs  . Lack of Transportation (Medical): No  . Lack of Transportation (Non-Medical): No  Physical Activity: Sufficiently Active  . Days of Exercise per Week: 5 days  . Minutes of Exercise per Session: 50 min  Stress: Stress Concern Present  . Feeling of Stress : To some extent  Social Connections: Slightly Isolated  . Frequency of Communication with Friends and Family: More than three times a week  . Frequency of Social Gatherings with Friends and Family: More than three times a week  . Attends Religious Services: More than 4 times per year  . Active Member of Clubs or Organizations: Yes  . Attends Archivist Meetings: More than 4 times per year  . Marital Status: Widowed  Intimate Partner Violence:   . Fear of Current or Ex-Partner:   . Emotionally Abused:   Marland Kitchen Physically Abused:   . Sexually Abused:    Current Outpatient Medications on File Prior to Visit  Medication Sig Dispense Refill  . atorvastatin (LIPITOR) 40 MG tablet Take 1 tablet (40 mg total) by mouth daily at 6 PM. 30 tablet 3  . Bioflavonoid Products (VITAMIN C) CHEW Chew 1 tablet by mouth daily.     . cyanocobalamin 1000 MCG tablet Take 5,000 mcg by mouth every other day.      . diclofenac sodium (VOLTAREN) 1 % GEL Apply 4 g topically 4 (four) times daily. 150 g 1  . EPINEPHrine (EPIPEN 2-PAK) 0.3 mg/0.3 mL DEVI Inject 0.3 mg into the muscle daily as needed (allergic reaction).     Marland Kitchen glipiZIDE (GLUCOTROL) 5 MG tablet TAKE 1 TABLET BY MOUTH 2  TIMES DAILY BEFORE A MEAL 180 tablet 3  . insulin NPH Human (NOVOLIN N RELION) 100 UNIT/ML injection Inject 14 units at bedtime under skin 10 mL 11  . Insulin Syringes, Disposable, U-100 0.5 ML MISC Use 1x a day 100 each 3  . lisinopril (PRINIVIL,ZESTRIL) 5 MG tablet Take 1 tablet (5 mg total) by mouth daily. 30 tablet 3  . metFORMIN (GLUCOPHAGE) 1000 MG tablet TAKE 1 TABLET BY MOUTH  TWICE DAILY WITH MEALS 180 tablet 3  . metoprolol succinate (TOPROL-XL) 50 MG 24 hr tablet Take 50 mg by mouth daily.     . Multiple Vitamins-Minerals (ICAPS PO) Take 1 tablet by mouth daily.    Marland Kitchen neomycin-polymyxin-dexameth (MAXITROL) 0.1 % OINT Place 1 application into the left eye 2 (two) times daily.    . nitroGLYCERIN (NITROSTAT) 0.4 MG SL tablet Place 1 tablet (0.4 mg total) under the tongue every 5 (five) minutes as needed for chest pain. 25 tablet 1  . ONETOUCH DELICA LANCETS FINE MISC Use to check sugar daily 100 each 5  . ONETOUCH VERIO test strip USE 1 STRIP TO CHECK GLUCOSE ONCE DAILY 50 each 11  . sodium chloride (OCEAN) 0.65 % SOLN nasal spray Place 2 sprays into both nostrils 3 (three) times daily. 1 Bottle 0  . tamsulosin (FLOMAX) 0.4 MG CAPS capsule Take 1 capsule (0.4 mg total) by mouth daily. 30 capsule 3  . vitamin E 400 UNIT capsule Take 400 Units by mouth daily.     No current facility-administered medications on file prior to visit.   No Known Allergies Family History  Problem Relation Age of Onset  .  Drug abuse Other   . Cancer Other   . Heart disease Other   . Lung disease Other   . Diabetes Mother   . Diabetes Sister   . Colon cancer Neg Hx   . Stomach cancer Neg Hx     PE: BP 118/82   Pulse 86   Ht 5\' 11"   (1.803 m)   Wt 249 lb (112.9 kg)   SpO2 98%   BMI 34.73 kg/m  Wt Readings from Last 3 Encounters:  12/22/19 249 lb (112.9 kg)  10/12/19 244 lb (110.7 kg)  10/08/19 244 lb (110.7 kg)   Constitutional: overweight, in NAD Eyes: PERRLA, EOMI, no exophthalmos ENT: moist mucous membranes, no thyromegaly, no cervical lymphadenopathy Cardiovascular: RRR, No MRG, +R>L variable swelling Respiratory: CTA B Gastrointestinal: abdomen soft, NT, ND, BS+ Musculoskeletal: no deformities, strength intact in all 4 Skin: moist, warm, no rashes Neurological: no tremor with outstretched hands, DTR normal in all 4  ASSESSMENT: 1. DM2, insulin-dependent, uncontrolled, with complications - CAD - s/p NSTEMI (Dr. Terrence [place]) - peripheral neuropathy - CKD  2. B12 def  3. HL  PLAN:  1. Patient with longstanding, uncontrolled, type 2 diabetes, poorly controlled, with an HbA1c at last visit of 8%, higher.  At that time, I suggested adding NPH insulin to his oral regimen as his sugars were higher in the morning and fluctuating based on whether he had snacks after dinner or not.  We discussed about the importance of stopping the snacks.  Unfortunately, he cannot afford any higher medication tier. He is currently on Metformin, sulfonylurea, but he did not start insulin... -At this visit, sugars are very variable, if he controls his diet at night, they are after close to goal, however, if he eats heavier meals or snacks after dinner, they may be up to 200s.  We discussed that ideally he would stop snacking and not eat after getting dark outside.  Otherwise, we will probably need his patients.  For now, he wants to again try the diet but I advised him that this will need to be a long-term commitment for him, not only for the next 3 months.  He agrees to try.  I also advised him to check some sugars later in the day, also, since he is only checking in the knee now. - I advised him to: Patient Instructions   Please  continue: - Metformin 1000 mg 2x a day with meals - Glipizide 10 mg 2x a day before meals  Please check sugars later in the day, also.  Limit calories at night. Ideally, do not eat when it is dark outside.  Please return in 3-4 months with your sugar log.  - we checked his HbA1c: 7.4% (improved) - advised to check sugars at different times of the day - 2x a day, rotating check times - advised for yearly eye exams >> he is UTD - return to clinic in 3-4 months      2.  Peripheral neuropathy  -Possibly related to diabetes and B12 deficiency -We initially started him on intramuscular injections of B12, then switched to 5000 mcg B12 daily, currently on 5000 mcg  -Latest B12 level was slightly high so PCP suggested to move his supplement from every other day to every 3 days. -He is taking this now -We will recheck the level today  3. HL -Reviewed latest lipid panel from 09/2019: LDL much improved, now all fractions at goal: Lab Results  Component Value Date   CHOL 127  10/08/2019   HDL 46.10 10/08/2019   LDLCALC 55 10/08/2019   LDLDIRECT 139.8 07/08/2014   TRIG 130.0 10/08/2019   CHOLHDL 3 10/08/2019  -Continues Lipitor without side effects  Component     Latest Ref Rng & Units 12/22/2019  Vitamin B12     211 - 911 pg/mL 748  Vitamin B12 is now normal.  He can continue with 5000 mcg every third day or switch to 1000 mcg daily.  Philemon Kingdom, MD PhD [facility] Endocrin[facility]

## 2019-12-23 DIAGNOSIS — H353133 Nonexudative age-related macular degeneration, bilateral, advanced atrophic without subfoveal involvement: Secondary | ICD-10-CM | POA: Diagnosis not present

## 2019-12-23 DIAGNOSIS — E113293 Type 2 diabetes mellitus with mild nonproliferative diabetic retinopathy without macular edema, bilateral: Secondary | ICD-10-CM | POA: Diagnosis not present

## 2019-12-23 DIAGNOSIS — H35372 Puckering of macula, left eye: Secondary | ICD-10-CM | POA: Diagnosis not present

## 2019-12-23 DIAGNOSIS — H34811 Central retinal vein occlusion, right eye, with macular edema: Secondary | ICD-10-CM | POA: Diagnosis not present

## 2020-01-03 DIAGNOSIS — E119 Type 2 diabetes mellitus without complications: Secondary | ICD-10-CM | POA: Diagnosis not present

## 2020-01-14 ENCOUNTER — Telehealth: Payer: Self-pay | Admitting: Adult Health

## 2020-01-14 NOTE — Chronic Care Management (AMB) (Signed)
  Chronic Care Management   Note  01/14/2020 Name: Charles [name] MRN: [id number] DOB: 26-Dec-1940  Charles [name] is a 79 y.o. year old male who is a primary care patient of [name], NP. I reached out to [name] by phone today in response to a referral sent by [name] PCP, [name], NP.   [name] was given information about Chronic Care Management services today including:  1. CCM service includes personalized support from designated clinical staff supervised by his physician, including individualized plan of care and coordination with other care providers 2. 24/7 contact phone numbers for assistance for urgent and routine care needs. 3. Service will only be billed when office clinical staff spend 20 minutes or more in a month to coordinate care. 4. Only one practitioner may furnish and bill the service in a calendar month. 5. The patient may stop CCM services at any time (effective at the end of the month) by phone call to the office staff.   Patient agreed to services and verbal consent obtained.   Follow up plan:   [place]

## 2020-01-17 ENCOUNTER — Other Ambulatory Visit: Payer: Self-pay | Admitting: Adult Health

## 2020-01-17 DIAGNOSIS — R3912 Poor urinary stream: Secondary | ICD-10-CM

## 2020-01-17 DIAGNOSIS — N401 Enlarged prostate with lower urinary tract symptoms: Secondary | ICD-10-CM

## 2020-01-19 NOTE — Telephone Encounter (Signed)
Sent to the pharmacy by e-scribe. 

## 2020-02-09 ENCOUNTER — Other Ambulatory Visit: Payer: Self-pay

## 2020-02-09 ENCOUNTER — Ambulatory Visit (INDEPENDENT_AMBULATORY_CARE_PROVIDER_SITE_OTHER): Payer: Medicare Other | Admitting: Adult Health

## 2020-02-09 ENCOUNTER — Encounter: Payer: Self-pay | Admitting: Adult Health

## 2020-02-09 VITALS — BP 108/70 | HR 92 | Temp 97.7°F | Ht 71.0 in | Wt 250.0 lb

## 2020-02-09 DIAGNOSIS — M5432 Sciatica, left side: Secondary | ICD-10-CM | POA: Diagnosis not present

## 2020-02-09 MED ORDER — CYCLOBENZAPRINE HCL 10 MG PO TABS: 10.0000 mg | ORAL_TABLET | Freq: Every day | ORAL | 0 refills | Status: DC

## 2020-02-09 MED ORDER — METHYLPREDNISOLONE 4 MG PO TBPK: ORAL_TABLET | ORAL | 0 refills | Status: DC

## 2020-02-09 NOTE — Progress Notes (Signed)
   Subjective:    Patient ID: Charles [name], male    DOB: 10-10-1940, 79 y.o.   MRN: [id number]  HPI    Review of Systems     Objective:   Physical Exam        Assessment & Plan:

## 2020-02-09 NOTE — Progress Notes (Signed)
Subjective:    Patient ID: Charles [name], male    DOB: February 09, 1941, 79 y.o.   MRN: [id number]  HPI 79 year old male who  has a past medical history of Anemia, Arthritis, AVM (arteriovenous malformation) of colon, Biceps muscle tear, Cancer of kidney ([place]) (2000), Coronary artery disease, Eczema, GERD (gastroesophageal reflux disease), Hernia, ventral, Hyperlipidemia, Hypertension, Obesity, Personal history of adenomatous colonic polyps (07/20/2012), Pneumonia (1942; 1950s X 1), Type II diabetes mellitus ([place]), and Walking pneumonia (2000's X 1).  He presents to the office today for an acute issue that has been intermittently present for the last 1 to 2 months.  He reports pain that radiates down the outside of his left leg.  Pain is worse with sitting for periods of time, change of position, and walking up stairs.  He is unable to sleep on his left side without discomfort.  Pain is felt as a "dull ache".  Pain usually resolves after walking a distance.   He denies any new low back pain or using any over-the-counter medications  Review of Systems See HPI   Past Medical History:  Diagnosis Date  . Anemia    "when I was a lad"  . Arthritis    "right femur" (10/19/2015)  . AVM (arteriovenous malformation) of colon    2 - non-bleeding 2013  . Biceps muscle tear    right  . Cancer of kidney ([place]) 2000   left nephrectomy  . Coronary artery disease   . Eczema   . GERD (gastroesophageal reflux disease)   . Hernia, ventral   . Hyperlipidemia   . Hypertension   . Obesity   . Personal history of adenomatous colonic polyps 07/20/2012   3 + adenomas 2009 07/20/2012 2 diminutive polyps    . Pneumonia 1942; 1950s X 1  . Type II diabetes mellitus ([place])   . Walking pneumonia 2000's X 1    Social History   Socioeconomic History  . Marital status: Widowed    Spouse name: Not on file  . Number of children: 3  . Years of education: Not on file  . Highest education level: Not on file    Occupational History  . Occupation: retired  Tobacco Use  . Smoking status: Former Smoker    Years: 20.00    Types: Cigarettes, Pipe, Cigars    Quit date: 09/09/1968    Years since quitting: 51.4  . Smokeless tobacco: Never Used  . Tobacco comment: smoking cigars- quit in 13   Substance and Sexual Activity  . Alcohol use: Yes    Alcohol/week: 1.0 standard drinks    Types: 1 Cans of beer per week    Comment: occasionally   . Drug use: No  . Sexual activity: Never  Other Topics Concern  . 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     widowed    Three children, One in [place], one in [place] one in [place] - 2 : youngest daughter lives with him   Social Determinants of Health   Financial Resource Strain: Low Risk   . Difficulty of Paying Living Expenses: Not hard at all  Food Insecurity:   . Worried About Charity fundraiser in the Last Year:   . Arboriculturist in the Last Year:   Transportation Needs: No Transportation  Needs  . Lack of Transportation (Medical): No  . Lack of Transportation (Non-Medical): No  Physical Activity: Sufficiently Active  . Days of Exercise per Week: 5 days  . Minutes of Exercise per Session: 50 min  Stress: Stress Concern Present  . Feeling of Stress : To some extent  Social Connections: Slightly Isolated  . Frequency of Communication with Friends and Family: More than three times a week  . Frequency of Social Gatherings with Friends and Family: More than three times a week  . Attends Religious Services: More than 4 times per year  . Active Member of Clubs or Organizations: Yes  . Attends Archivist Meetings: More than 4 times per year  . Marital Status: Widowed  Intimate Partner Violence:   . Fear of Current or Ex-Partner:   . Emotionally Abused:   Marland Kitchen Physically Abused:   . Sexually Abused:      Past Surgical History:  Procedure Laterality Date  . CARDIAC CATHETERIZATION N/A 10/19/2015   Procedure: Left Heart Cath and Coronary Angiography;  Surgeon: Charolette Forward, MD;  Location: [facility];  Service: Cardiovascular;  Laterality: N/A;  . CARDIAC CATHETERIZATION N/A 10/19/2015   Procedure: Coronary Stent Intervention;  Surgeon: Charolette Forward, MD;  Location: [facility];  Service: Cardiovascular;  Laterality: N/A;  . CATARACT EXTRACTION, BILATERAL Bilateral 01/2018   Dr. Kathrin Penner did surgery and he developed MD and is seeing specialist now for MD treatment  . COLONOSCOPY     "nothing showed up this time"  . COLONOSCOPY W/ BIOPSIES AND POLYPECTOMY  X 1  . CORONARY ANGIOPLASTY    . FEMUR IM NAIL Right 07/26/2013   Procedure: INTRAMEDULLARY (IM) NAIL FEMORAL subtrochanteric;  Surgeon: Mauri Pole, MD;  Location: [facility];  Service: Orthopedics;  Laterality: Right;  . FRACTURE SURGERY    . INGUINAL HERNIA REPAIR Left >3 times  . INGUINAL HERNIA REPAIR Right 2012  . NEPHRECTOMY Left 2000  . TONSILLECTOMY  1940s    Family History  Problem Relation Age of Onset  . Drug abuse Other   . Cancer Other   . Heart disease Other   . Lung disease Other   . Diabetes Mother   . Diabetes Sister   . Colon cancer Neg Hx   . Stomach cancer Neg Hx     No Known Allergies  Current Outpatient Medications on File Prior to Visit  Medication Sig Dispense Refill  . atorvastatin (LIPITOR) 40 MG tablet Take 1 tablet (40 mg total) by mouth daily at 6 PM. 30 tablet 3  . Bioflavonoid Products (VITAMIN C) CHEW Chew 1 tablet by mouth daily.     . cyanocobalamin 1000 MCG tablet Take 5,000 mcg by mouth every other day.     . diclofenac sodium (VOLTAREN) 1 % GEL Apply 4 g topically 4 (four) times daily. 150 g 1  . EPINEPHrine (EPIPEN 2-PAK) 0.3 mg/0.3 mL DEVI Inject 0.3 mg into the muscle daily as needed (allergic reaction).     Marland Kitchen glipiZIDE (GLUCOTROL) 5 MG tablet TAKE 1 TABLET BY MOUTH 2   TIMES DAILY BEFORE A MEAL 180 tablet 3  . insulin NPH Human (NOVOLIN N RELION) 100 UNIT/ML injection Inject 14 units at bedtime under skin 10 mL 11  . Insulin Syringes, Disposable, U-100 0.5 ML MISC Use 1x a day 100 each 3  . lisinopril (PRINIVIL,ZESTRIL) 5 MG tablet Take 1 tablet (5 mg total) by mouth daily. 30 tablet 3  . metFORMIN (GLUCOPHAGE) 1000  MG tablet TAKE 1 TABLET BY MOUTH  TWICE DAILY WITH MEALS 180 tablet 3  . metoprolol succinate (TOPROL-XL) 50 MG 24 hr tablet Take 50 mg by mouth daily.     . Multiple Vitamins-Minerals (ICAPS PO) Take 1 tablet by mouth daily.    Marland Kitchen neomycin-polymyxin-dexameth (MAXITROL) 0.1 % OINT Place 1 application into the left eye 2 (two) times daily.    . nitroGLYCERIN (NITROSTAT) 0.4 MG SL tablet Place 1 tablet (0.4 mg total) under the tongue every 5 (five) minutes as needed for chest pain. 25 tablet 1  . ONETOUCH DELICA LANCETS FINE MISC Use to check sugar daily 100 each 5  . ONETOUCH VERIO test strip USE 1 STRIP TO CHECK GLUCOSE ONCE DAILY 50 each 11  . sodium chloride (OCEAN) 0.65 % SOLN nasal spray Place 2 sprays into both nostrils 3 (three) times daily. 1 Bottle 0  . tamsulosin (FLOMAX) 0.4 MG CAPS capsule TAKE 1 CAPSULE BY MOUTH  DAILY 90 capsule 2  . vitamin E 400 UNIT capsule Take 400 Units by mouth daily.     No current facility-administered medications on file prior to visit.    BP 108/70   Pulse 92   Temp 97.7 F (36.5 C) (Other (Comment))   Ht 5\' 11"  (1.803 m)   Wt 250 lb (113.4 kg)   SpO2 96%   BMI 34.87 kg/m       Objective:   Physical Exam Vitals and nursing note reviewed.  Constitutional:      Appearance: Normal appearance.  Musculoskeletal:        General: Tenderness (lateral aspect of left leg) present. No swelling, deformity or signs of injury. Normal range of motion.     Right lower leg: No edema.     Left lower leg: No edema.  Skin:    General: Skin is warm and dry.     Capillary Refill: Capillary refill takes less  than 2 seconds.  Neurological:     General: No focal deficit present.     Mental Status: He is alert and oriented to person, place, and time.  Psychiatric:        Mood and Affect: Mood normal.        Behavior: Behavior normal.        Thought Content: Thought content normal.        Judgment: Judgment normal.       Assessment & Plan:  1. Left sided sciatica -Advised warm compress as well as gentle stretching exercises.  Will prescribe Flexeril to take at night and a Medrol Dosepak.  Advise follow-up if not resolved in the next week - cyclobenzaprine (FLEXERIL) 10 MG tablet; Take 1 tablet (10 mg total) by mouth at bedtime.  Dispense: 10 tablet; Refill: 0 - methylPREDNISolone (MEDROL DOSEPAK) 4 MG TBPK tablet; Take as directed  Dispense: 21 tablet; Refill: 0   [name], NP

## 2020-02-10 DIAGNOSIS — H353133 Nonexudative age-related macular degeneration, bilateral, advanced atrophic without subfoveal involvement: Secondary | ICD-10-CM | POA: Diagnosis not present

## 2020-02-10 DIAGNOSIS — H34811 Central retinal vein occlusion, right eye, with macular edema: Secondary | ICD-10-CM | POA: Diagnosis not present

## 2020-02-15 ENCOUNTER — Other Ambulatory Visit: Payer: Self-pay | Admitting: Adult Health

## 2020-02-15 DIAGNOSIS — E785 Hyperlipidemia, unspecified: Secondary | ICD-10-CM

## 2020-02-15 DIAGNOSIS — I10 Essential (primary) hypertension: Secondary | ICD-10-CM

## 2020-02-15 NOTE — Chronic Care Management (AMB) (Signed)
Chronic Care Management Pharmacy  Name: Charles [name]  MRN: [id number] DOB: 02-28-1941  Initial Questions: 1. Have you seen any other providers since your last visit? NA 2. Any changes in your medicines or health? No   Chief Complaint/ HPI Charles [name],  79 y.o. , male presents for their Initial CCM visit with the clinical pharmacist via telephone due to COVID-19 Pandemic.  Patient reported feeling confident in regards to his medications.   PCP : [name], NP  Their chronic conditions include: DM, HTN, HLD, History of NSTEMI (2017), vitamin B12 deficiency, right sided sciatica,. BPH  Office Visits: 02/09/2020- [name], NP- Patient presented for office visit for pain radiating down the outside of his left leg. For left sided sciatica, patient advised to use warm compress as well as gentle stretching exercises. Patient prescribed Flexeril at night and Medrol Dosepak.   2/09/2021[name], NP- Patient presented for virtual visit for concerns of BPH (symptoms reported: decreased stream, nocturia 3 times a night, incomplete bladder emptying, and urinary frequency. Patient to trial tamulosin 0.4mg  daily. Patient to follow up if symptoms have not improved.   Consult Visit:  12/22/2019- Endocrinology- Henrine Screws, MD- Patient presented for office visit for DM follow up. No major interventions made. Patient opted to work on diet modifications to improve BG readings. Patient to continue metformin 1000mg  twice daily with meals and glipizide 10mg  twice daily before meals. Patient to check sugars later in the day as well. Patient to return in 3 to 4 months with sugar log.   Medications: Outpatient Encounter Medications as of 02/16/2020  Medication Sig Note  . ASTAXANTHIN PO Take 1 tablet by mouth daily.   Marland Kitchen atorvastatin (LIPITOR) 40 MG tablet Take 1 tablet (40 mg total) by mouth daily at 6 PM.   . Cyanocobalamin 5000 MCG TBDP Take 5,000 mcg by mouth every 3 (three) days.      . cyclobenzaprine (FLEXERIL) 10 MG tablet Take 1 tablet (10 mg total) by mouth at bedtime.   Marland Kitchen glipiZIDE (GLUCOTROL) 5 MG tablet TAKE 1 TABLET BY MOUTH 2  TIMES DAILY BEFORE A MEAL   . Insulin Syringes, Disposable, U-100 0.5 ML MISC Use 1x a day   . lisinopril (ZESTRIL) 20 MG tablet Take 20 mg by mouth daily.   . metFORMIN (GLUCOPHAGE) 1000 MG tablet TAKE 1 TABLET BY MOUTH  TWICE DAILY WITH MEALS   . methylPREDNISolone (MEDROL DOSEPAK) 4 MG TBPK tablet Take as directed   . metoprolol succinate (TOPROL-XL) 50 MG 24 hr tablet Take 50 mg by mouth daily.    . Multiple Vitamins-Minerals (PRESERVISION AREDS 2 PO) Take 1 tablet by mouth daily.   . nitroGLYCERIN (NITROSTAT) 0.4 MG SL tablet Place 1 tablet (0.4 mg total) under the tongue every 5 (five) minutes as needed for chest pain.   Glory Rosebush DELICA LANCETS FINE MISC Use to check sugar daily   . ONETOUCH VERIO test strip USE 1 STRIP TO CHECK GLUCOSE ONCE DAILY   . tamsulosin (FLOMAX) 0.4 MG CAPS capsule TAKE 1 CAPSULE BY MOUTH  DAILY   . Bioflavonoid Products (VITAMIN C) CHEW Chew 1 tablet by mouth daily.    . diclofenac sodium (VOLTAREN) 1 % GEL Apply 4 g topically 4 (four) times daily. (Patient not taking: Reported on 02/16/2020)   . EPINEPHrine (EPIPEN 2-PAK) 0.3 mg/0.3 mL DEVI Inject 0.3 mg into the muscle daily as needed (allergic reaction).    . insulin NPH Human (NOVOLIN N RELION) 100 UNIT/ML  injection Inject 14 units at bedtime under skin (Patient not taking: Reported on 02/16/2020)   . lisinopril (PRINIVIL,ZESTRIL) 5 MG tablet Take 1 tablet (5 mg total) by mouth daily. 02/16/2020: Dose increased to 20mg   . Multiple Vitamins-Minerals (ICAPS PO) Take 1 tablet by mouth daily.   Marland Kitchen neomycin-polymyxin-dexameth (MAXITROL) 0.1 % OINT Place 1 application into the left eye 2 (two) times daily.   . sodium chloride (OCEAN) 0.65 % SOLN nasal spray Place 2 sprays into both nostrils 3 (three) times daily. (Patient not taking: Reported on 02/16/2020)   . vitamin  E 400 UNIT capsule Take 400 Units by mouth daily.    No facility-administered encounter medications on file as of 02/16/2020.     Current Diagnosis/Assessment:  Goals Addressed            This Visit's Progress   . Pharmacy Care Plan       CARE PLAN ENTRY  Current Barriers:  . Chronic Disease Management support, education, and care coordination needs related to Hypertension, Hyperlipidemia, Diabetes, and History of NSTEMI (2017), vitamin B12 deficiency, Right sided sciatica, BPH   Hypertension . Pharmacist Clinical Goal(s): o Over the next 90 days, patient will work with PharmD and providers to maintain BP goal <130/80 . Current regimen:   Lisinopril 20 mg, 1 tablet once daily  Metoprolol succinate (Toprol-XL) 50mg , 1 tablet once daily . Interventions: o We discussed monitoring BP at home and providing readings at next cardiology visit for further assessment. . Patient self care activities - Over the next 90 days, patient will: o Recommended to check BP at home, document, and provide at future appointments o Ensure daily salt intake < 2300 mg/day  Hyperlipidemia/ History of NSTEMI, 2017 . Pharmacist Clinical Goal(s): o Over the next 90 days, patient will work with PharmD and providers to maintain LDL goal < 70 . Current regimen:  . Atorvastatin 40mg , 1 tablet daily at 6pm . Nitroglycerin 0.4mg  SL, 1 tablet under tongue every 5 minutes as needed for chest pain  . Interventions: o We discussed verifying expiration date of nitroglycerin.  . Patient self care activities - Over the next 90 days, patient will: o Continue medications as directed. o Consult with cardiologist if nitroglycerin is still needed and request refill as indicated.    Diabetes . Pharmacist Clinical Goal(s): o Over the next 90 days, patient will work with PharmD and providers to achieve A1c goal <7% . Current regimen:   Glipizide 5mg , 1 tablet twice daily with a meal  Metformin 1000mg , 1 tablet twice  daily with meals  . Interventions: o We discussed importance of BG monitoring as instructed.  . Patient self care activities - Over the next 90 days, patient will: o Check blood sugar as instructed document, and provide at future appointments o Contact provider with any episodes of hypoglycemia  Vitamin B12 deficiency (resolved) . Pharmacist Clinical Goal(s) o Over the next 90 days, patient will work with PharmD and providers to maintain vitamin B12 level: 211 - 911 pg/mL . Current regimen:  o Cyanocobalamin 509mcg, 5,024mcg every 3rd day . Patient self care activities o Patient will continue medications as directed.   Right sided sciatica  . Pharmacist Clinical Goal(s) o Over the next 90 days, patient will work with PharmD and providers to  . Current regimen:  . Cyclobenzaprine (Flexeril) 10mg , 1 tablet at bedtime  . Medrol Dosepak, takes as directed  . Patient self care activities o Patient will continue current medications and stretching exercises.  BPH (Benign prostatic hyperplasia)  . Pharmacist Clinical Goal(s) o Over the next 90 days, patient will work with PharmD and providers to minimize urinary symptoms. . Current regimen:  o tamsulosin 0.4mg , 1 capsule once daily . Patient self care activities o Patient will continue medications as directed.   Medication management . Pharmacist Clinical Goal(s): o Over the next 90 days, patient will work with PharmD and providers to maintain optimal medication adherence . Current pharmacy: [facility] . Interventions o Comprehensive medication review performed. o Continue current medication management strategy (pill box).  . Patient self care activities - Over the next 90 days, patient will: o Take medications as prescribed o Report any questions or concerns to PharmD and/or provider(s)  Initial goal documentation       SDOH Interventions     Most Recent Value  SDOH Interventions  Financial Strain  Interventions  Intervention Not Indicated  Transportation Interventions  Intervention Not Indicated       Diabetes   Recent Relevant Labs: Lab Results  Component Value Date/Time   HGBA1C 7.4 (A) 12/22/2019 11:14 AM   HGBA1C 8.0 (A) 08/19/2019 09:22 AM   HGBA1C 7.9 (H) 09/29/2018 02:23 AM   HGBA1C 6.9 (H) 08/08/2016 08:56 AM   MICROALBUR 13.8 (H) 08/08/2016 08:56 AM   MICROALBUR 11.4 (H) 07/21/2015 08:37 AM    Checking BG: Daily But has not checked BGs the previous week due to being on a steroid. Patient reported he will start checking again when he is done with steroid taper. Last day of steroid is today.   Average FBG Readings: 120-140 mg/dl   Patient has failed these meds in past: Reybulsus (previous high copay); Insulin NPH (Novolin N)- patient prefers to work on diet modifications  Patient is currently uncontrolled on the following medications:   Glipizide 5mg , 1 tablet twice daily with a meal  Metformin 1000mg , 1 tablet twice daily with meals   Last diabetic Foot exam:  Lab Results  Component Value Date/Time   HMDIABEYEEXA No Retinopathy 11/12/2017 12:00 AM     Last diabetic Eye exam:  Lab Results  Component Value Date/Time   HMDIABFOOTEX yes 11/24/2009 12:00 AM     We discussed: importance of BG monitoring.   Plan Managed by endo (Dr. Cruzita Lederer). Has follow up on 05/03/20.  Continue current medications   Hypertension  Denies dizziness/ lightheadedness.   Office blood pressures are  BP Readings from Last 3 Encounters:  02/09/20 108/70  12/22/19 118/82  10/12/19 132/70   Patient has failed these meds in the past: none   Patient checks BP at home: does not monitor at home.   Patient home BP readings are ranging: NA  Patient is controlled on:   Lisinopril 20 mg, 1 tablet once daily  Metoprolol succinate (Toprol-XL) 50mg , 1 tablet once daily  We discussed monitoring BP at home and providing readings at next cardiology visit for further assessment.    Plan Managed by cardiologist (has visit tomorrow).  Continue current medications   Hyperlipidemia/ History of NSTEMI, 2017   Nitro- states has never needed to use. Most likely expired.   Lipid Panel     Component Value Date/Time   CHOL 127 10/08/2019 0949   TRIG 130.0 10/08/2019 0949   TRIG 149 07/25/2006 0854   HDL 46.10 10/08/2019 0949   LDLCALC 55 10/08/2019 0949   LDLDIRECT 139.8 07/08/2014 0845     The ASCVD Risk score (Goff DC Jr., et al., 2013) failed to calculate for the  following reasons:   The patient has a prior MI or stroke diagnosis   Patient has failed these meds in past: none  Patient is currently controlled on the following medications:  . Atorvastatin 40mg , 1 tablet daily at 6pm . Nitroglycerin 0.4mg  SL, 1 tablet under tongue every 5 minutes as needed for chest pain   Plan Continue current medications  Vitamin B12 deficiency (resolved)   Vitamin B12: 748 (12/22/2019)  Patient is currently controlled on the following medications:   Cyanocobalamin 5038mcg, 5,0109mcg every 3rd day  Plan Continue current medications  Right sided sciatica   Patient stated improvement first 3 days. This past Monday, patient reported pain came back again, but not as bad as before.  Stretching exercises- patient reports used to do a lot in the past.  Patient plans to finish course of cyclobenzaprine will reach back with [facility] for reassessment if needed.   Patient is currently uncontrolled on the following medications:  . Cyclobenzaprine (Flexeril) 10mg , 1 tablet at bedtime  . Medrol Dosepak, takes as directed    Plan Continue current medications. Patient to reach back to office if continued pain.   BPH  Patient reports improvement since being on this.    Patient is currently controlled on the following medications:  . tamsulosin 0.4mg , 1 capsule once daily  Plan Continue current medications  OTC/ supplements  . Preservision Areds 2, 1 tablet once daily   . Astaxanthin, 1 tablet once daily   We discussed:   - astaxanthin and zeaxanthin (in Areds 2) are both carotenoids. Patient preferred to continue both.   Plan Continue current medications   Medication Management  Patient organizes medications: patient uses pill box  Primary pharmacy: [facility] Adherence: no gaps in refill history (per medication dispense history from 08/19/2019 to 02/15/2020)    Follow up Follow up visit with PharmD in 3 months (after cardiologist and endocrinologist visit).    Anson Crofts, PharmD Clinical Pharmacist [facility] [phone]

## 2020-02-16 ENCOUNTER — Other Ambulatory Visit: Payer: Self-pay

## 2020-02-16 ENCOUNTER — Ambulatory Visit: Payer: Medicare Other

## 2020-02-16 DIAGNOSIS — I10 Essential (primary) hypertension: Secondary | ICD-10-CM

## 2020-02-16 DIAGNOSIS — N401 Enlarged prostate with lower urinary tract symptoms: Secondary | ICD-10-CM

## 2020-02-16 DIAGNOSIS — M5432 Sciatica, left side: Secondary | ICD-10-CM

## 2020-02-16 DIAGNOSIS — E1159 Type 2 diabetes mellitus with other circulatory complications: Secondary | ICD-10-CM

## 2020-02-16 DIAGNOSIS — E785 Hyperlipidemia, unspecified: Secondary | ICD-10-CM

## 2020-02-16 DIAGNOSIS — E538 Deficiency of other specified B group vitamins: Secondary | ICD-10-CM

## 2020-02-16 DIAGNOSIS — I214 Non-ST elevation (NSTEMI) myocardial infarction: Secondary | ICD-10-CM

## 2020-02-16 NOTE — Patient Instructions (Addendum)
Visit Information  Goals Addressed            This Visit's Progress    Pharmacy Care Plan       CARE PLAN ENTRY  Current Barriers:   Chronic Disease Management support, education, and care coordination needs related to Hypertension, Hyperlipidemia, Diabetes, and History of NSTEMI (2017), vitamin B12 deficiency, Right sided sciatica, BPH   Hypertension  Pharmacist Clinical Goal(s): o Over the next 90 days, patient will work with PharmD and providers to maintain BP goal <130/80  Current regimen:   Lisinopril 20 mg, 1 tablet once daily  Metoprolol succinate (Toprol-XL) 50mg , 1 tablet once daily  Interventions: o We discussed monitoring BP at home and providing readings at next cardiology visit for further assessment.  Patient self care activities - Over the next 90 days, patient will: o Recommended to check BP at home, document, and provide at future appointments o Ensure daily salt intake < 2300 mg/day  Hyperlipidemia/ History of NSTEMI, 2017  Pharmacist Clinical Goal(s): o Over the next 90 days, patient will work with PharmD and providers to maintain LDL goal < 70  Current regimen:   Atorvastatin 40mg , 1 tablet daily at 6pm  Nitroglycerin 0.4mg  SL, 1 tablet under tongue every 5 minutes as needed for chest pain   Interventions: o We discussed verifying expiration date of nitroglycerin.   Patient self care activities - Over the next 90 days, patient will: o Continue medications as directed. o Consult with cardiologist if nitroglycerin is still needed and request refill as indicated.    Diabetes  Pharmacist Clinical Goal(s): o Over the next 90 days, patient will work with PharmD and providers to achieve A1c goal <7%  Current regimen:   Glipizide 5mg , 1 tablet twice daily with a meal  Metformin 1000mg , 1 tablet twice daily with meals   Interventions: o We discussed importance of BG monitoring as instructed.   Patient self care activities - Over the next 90  days, patient will: o Check blood sugar as instructed document, and provide at future appointments o Contact provider with any episodes of hypoglycemia  Vitamin B12 deficiency (resolved)  Pharmacist Clinical Goal(s) o Over the next 90 days, patient will work with PharmD and providers to maintain vitamin B12 level: 211 - 911 pg/mL  Current regimen:  o Cyanocobalamin 5076mcg, 5,091mcg every 3rd day  Patient self care activities o Patient will continue medications as directed.   Right sided sciatica   Pharmacist Clinical Goal(s) o Over the next 90 days, patient will work with PharmD and providers to   Current regimen:   Cyclobenzaprine (Flexeril) 10mg , 1 tablet at bedtime   Medrol Dosepak, takes as directed   Patient self care activities o Patient will continue current medications and stretching exercises.   BPH (Benign prostatic hyperplasia)   Pharmacist Clinical Goal(s) o Over the next 90 days, patient will work with PharmD and providers to minimize urinary symptoms.  Current regimen:  o tamsulosin 0.4mg , 1 capsule once daily  Patient self care activities o Patient will continue medications as directed.   Medication management  Pharmacist Clinical Goal(s): o Over the next 90 days, patient will work with PharmD and providers to maintain optimal medication adherence  Current pharmacy: [facility]  Interventions o Comprehensive medication review performed. o Continue current medication management strategy (pill box).   Patient self care activities - Over the next 90 days, patient will: o Take medications as prescribed o Report any questions or concerns to PharmD  and/or provider(s)  Initial goal documentation        Mr. Kader was given information about Chronic Care Management services today including:  1. CCM service includes personalized support from designated clinical staff supervised by his physician, including individualized plan of care and  coordination with other care providers 2. 24/7 contact phone numbers for assistance for urgent and routine care needs. 3. Standard insurance, coinsurance, copays and deductibles apply for chronic care management only during months in which we provide at least 20 minutes of these services. Most insurances cover these services at 100%, however patients may be responsible for any copay, coinsurance and/or deductible if applicable. This service may help you avoid the need for more expensive face-to-face services. 4. Only one practitioner may furnish and bill the service in a calendar month. 5. The patient may stop CCM services at any time (effective at the end of the month) by phone call to the office staff.  Patient agreed to services and verbal consent obtained.   The patient verbalized understanding of instructions provided today and agreed to receive a mailed copy of patient instruction and/or educational materials. Telephone follow up appointment with pharmacy team member scheduled for: 05/17/2020  Anson Crofts, PharmD Clinical Pharmacist [facility] [phone]   Diabetes Mellitus and Nutrition, Adult When you have diabetes (diabetes mellitus), it is very important to have healthy eating habits because your blood sugar (glucose) levels are greatly affected by what you eat and drink. Eating healthy foods in the appropriate amounts, at about the same times every day, can help you:  Control your blood glucose.  Lower your risk of heart disease.  Improve your blood pressure.  Reach or maintain a healthy weight. Every person with diabetes is different, and each person has different needs for a meal plan. Your health care provider may recommend that you work with a diet and nutrition specialist (dietitian) to make a meal plan that is best for you. Your meal plan may vary depending on factors such as:  The calories you need.  The medicines you take.  Your  weight.  Your blood glucose, blood pressure, and cholesterol levels.  Your activity level.  Other health conditions you have, such as heart or kidney disease. How do carbohydrates affect me? Carbohydrates, also called carbs, affect your blood glucose level more than any other type of food. Eating carbs naturally raises the amount of glucose in your blood. Carb counting is a method for keeping track of how many carbs you eat. Counting carbs is important to keep your blood glucose at a healthy level, especially if you use insulin or take certain oral diabetes medicines. It is important to know how many carbs you can safely have in each meal. This is different for every person. Your dietitian can help you calculate how many carbs you should have at each meal and for each snack. Foods that contain carbs include:  Bread, cereal, rice, pasta, and crackers.  Potatoes and corn.  Peas, beans, and lentils.  Milk and yogurt.  Fruit and juice.  Desserts, such as cakes, cookies, ice cream, and candy. How does alcohol affect me? Alcohol can cause a sudden decrease in blood glucose (hypoglycemia), especially if you use insulin or take certain oral diabetes medicines. Hypoglycemia can be a life-threatening condition. Symptoms of hypoglycemia (sleepiness, dizziness, and confusion) are similar to symptoms of having too much alcohol. If your health care provider says that alcohol is safe for you, follow these guidelines:  Limit alcohol intake to no more than 1 drink per day for nonpregnant women and 2 drinks per day for men. One drink equals 12 oz of beer, 5 oz of wine, or 1 oz of hard liquor.  Do not drink on an empty stomach.  Keep yourself hydrated with water, diet soda, or unsweetened iced tea.  Keep in mind that regular soda, juice, and other mixers may contain a lot of sugar and must be counted as carbs. What are tips for following this plan?  Reading food labels  Start by checking the  serving size on the "Nutrition Facts" label of packaged foods and drinks. The amount of calories, carbs, fats, and other nutrients listed on the label is based on one serving of the item. Many items contain more than one serving per package.  Check the total grams (g) of carbs in one serving. You can calculate the number of servings of carbs in one serving by dividing the total carbs by 15. For example, if a food has 30 g of total carbs, it would be equal to 2 servings of carbs.  Check the number of grams (g) of saturated and trans fats in one serving. Choose foods that have low or no amount of these fats.  Check the number of milligrams (mg) of salt (sodium) in one serving. Most people should limit total sodium intake to less than 2,300 mg per day.  Always check the nutrition information of foods labeled as "low-fat" or "nonfat". These foods may be higher in added sugar or refined carbs and should be avoided.  Talk to your dietitian to identify your daily goals for nutrients listed on the label. Shopping  Avoid buying canned, premade, or processed foods. These foods tend to be high in fat, sodium, and added sugar.  Shop around the outside edge of the grocery store. This includes fresh fruits and vegetables, bulk grains, fresh meats, and fresh dairy. Cooking  Use low-heat cooking methods, such as baking, instead of high-heat cooking methods like deep frying.  Cook using healthy oils, such as olive, canola, or sunflower oil.  Avoid cooking with butter, cream, or high-fat meats. Meal planning  Eat meals and snacks regularly, preferably at the same times every day. Avoid going long periods of time without eating.  Eat foods high in fiber, such as fresh fruits, vegetables, beans, and whole grains. Talk to your dietitian about how many servings of carbs you can eat at each meal.  Eat 4-6 ounces (oz) of lean protein each day, such as lean meat, chicken, fish, eggs, or tofu. One oz of lean  protein is equal to: ? 1 oz of meat, chicken, or fish. ? 1 egg. ?  cup of tofu.  Eat some foods each day that contain healthy fats, such as avocado, nuts, seeds, and fish. Lifestyle  Check your blood glucose regularly.  Exercise regularly as told by your health care provider. This may include: ? 150 minutes of moderate-intensity or vigorous-intensity exercise each week. This could be brisk walking, biking, or water aerobics. ? Stretching and doing strength exercises, such as yoga or weightlifting, at least 2 times a week.  Take medicines as told by your health care provider.  Do not use any products that contain nicotine or tobacco, such as cigarettes and e-cigarettes. If you need help quitting, ask your health care provider.  Work with a Social worker or diabetes educator to identify strategies to manage stress and any emotional and social challenges. Questions to ask a  health care provider  Do I need to meet with a diabetes educator?  Do I need to meet with a dietitian?  What number can I call if I have questions?  When are the best times to check my blood glucose? Where to find more information:  American Diabetes Association: diabetes.org  Academy of Nutrition and Dietetics: www.eatright.[employer] of Diabetes and Digestive and Kidney Diseases (NIH): [employer] Summary  A healthy meal plan will help you control your blood glucose and maintain a healthy lifestyle.  Working with a diet and nutrition specialist (dietitian) can help you make a meal plan that is best for you.  Keep in mind that carbohydrates (carbs) and alcohol have immediate effects on your blood glucose levels. It is important to count carbs and to use alcohol carefully. This information is not intended to replace advice given to you by your health care provider. Make sure you discuss any questions you have with your health care provider. Document Revised: 08/08/2017 Document Reviewed:  09/30/2016 Elsevier Patient Education  2020 [employer].

## 2020-02-24 DIAGNOSIS — I2510 Atherosclerotic heart disease of native coronary artery without angina pectoris: Secondary | ICD-10-CM | POA: Diagnosis not present

## 2020-02-24 DIAGNOSIS — E785 Hyperlipidemia, unspecified: Secondary | ICD-10-CM | POA: Diagnosis not present

## 2020-02-24 DIAGNOSIS — I10 Essential (primary) hypertension: Secondary | ICD-10-CM | POA: Diagnosis not present

## 2020-02-24 DIAGNOSIS — E119 Type 2 diabetes mellitus without complications: Secondary | ICD-10-CM | POA: Diagnosis not present

## 2020-02-28 ENCOUNTER — Encounter: Payer: Self-pay | Admitting: Adult Health

## 2020-03-08 ENCOUNTER — Encounter: Payer: Self-pay | Admitting: Adult Health

## 2020-03-08 ENCOUNTER — Other Ambulatory Visit: Payer: Self-pay

## 2020-03-08 ENCOUNTER — Ambulatory Visit (INDEPENDENT_AMBULATORY_CARE_PROVIDER_SITE_OTHER): Payer: Medicare Other | Admitting: Adult Health

## 2020-03-08 VITALS — BP 146/92 | Temp 97.3°F | Wt 251.0 lb

## 2020-03-08 DIAGNOSIS — M5432 Sciatica, left side: Secondary | ICD-10-CM | POA: Diagnosis not present

## 2020-03-08 MED ORDER — METHYLPREDNISOLONE 4 MG PO TBPK: ORAL_TABLET | ORAL | 0 refills | Status: DC

## 2020-03-08 NOTE — Progress Notes (Signed)
Subjective:    Patient ID: Charles [name], male    DOB: 09-06-1941, 79 y.o.   MRN: [id number]  HPI 79 year old male who  has a past medical history of Anemia, Arthritis, AVM (arteriovenous malformation) of colon, Biceps muscle tear, Cancer of kidney ([place]) (2000), Coronary artery disease, Eczema, GERD (gastroesophageal reflux disease), Hernia, ventral, Hyperlipidemia, Hypertension, Obesity, Personal history of adenomatous colonic polyps (07/20/2012), Pneumonia (1942; 1950s X 1), Type II diabetes mellitus ([place]), and Walking pneumonia (2000's X 1).  He presents to the office today for follow-up of back to left-sided sciatica.  He was last seen on 02/09/2020 at this time his issue has been present intermittently for the past 1 to 2 months.  He reported that the pain radiated down the outside of his left leg, pain was worse with sitting for periods of time, change of position, and walking upstairs.  He was unable to sleep on his left side without discomfort.  The pain usually resolves after walking short distance.  He denied any new low back pain or using over-the-counter medications.   He was prescribed a short course of Flexeril and a Medrol Dosepak which he reports helped ease the pain but it never resolved.  He reports that in the time that he made this appointment and today his pain has improved further. He has been doing more stretching exercises at home. He is walking a few miles multiple times a week at the [facility] and reports that this does not cause any discomfort.   Review of Systems See HPI   Past Medical History:  Diagnosis Date  . Anemia    "when I was a lad"  . Arthritis    "right femur" (10/19/2015)  . AVM (arteriovenous malformation) of colon    2 - non-bleeding 2013  . Biceps muscle tear    right  . Cancer of kidney ([place]) 2000   left nephrectomy  . Coronary artery disease   . Eczema   . GERD (gastroesophageal reflux disease)   . Hernia, ventral   . Hyperlipidemia   .  Hypertension   . Obesity   . Personal history of adenomatous colonic polyps 07/20/2012   3 + adenomas 2009 07/20/2012 2 diminutive polyps    . Pneumonia 1942; 1950s X 1  . Type II diabetes mellitus ([place])   . Walking pneumonia 2000's X 1    Social History   Socioeconomic History  . Marital status: Widowed    Spouse name: Not on file  . Number of children: 3  . Years of education: Not on file  . Highest education level: Not on file  Occupational History  . Occupation: retired  Tobacco Use  . Smoking status: Former Smoker    Years: 20.00    Types: Cigarettes, Pipe, Cigars    Quit date: 09/09/1968    Years since quitting: 51.5  . Smokeless tobacco: Never Used  . Tobacco comment: smoking cigars- quit in 13   Substance and Sexual Activity  . Alcohol use: Yes    Alcohol/week: 1.0 standard drink    Types: 1 Cans of beer per week    Comment: occasionally   . Drug use: No  . Sexual activity: Never  Other Topics Concern  . 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     widowed    Three  children, One in [place], one in [place] one in [place] - 2 : youngest daughter lives with him   Social Determinants of Health   Financial Resource Strain: Low Risk   . Difficulty of Paying Living Expenses: Not hard at all  Food Insecurity:   . Worried About Charity fundraiser in the Last Year:   . Arboriculturist in the Last Year:   Transportation Needs: No Transportation Needs  . Lack of Transportation (Medical): No  . Lack of Transportation (Non-Medical): No  Physical Activity: Sufficiently Active  . Days of Exercise per Week: 5 days  . Minutes of Exercise per Session: 50 min  Stress: Stress Concern Present  . Feeling of Stress : To some extent  Social Connections: Moderately Integrated  . Frequency of Communication with Friends and Family: More than  three times a week  . Frequency of Social Gatherings with Friends and Family: More than three times a week  . Attends Religious Services: More than 4 times per year  . Active Member of Clubs or Organizations: Yes  . Attends Archivist Meetings: More than 4 times per year  . Marital Status: Widowed  Intimate Partner Violence:   . Fear of Current or Ex-Partner:   . Emotionally Abused:   Marland Kitchen Physically Abused:   . Sexually Abused:     Past Surgical History:  Procedure Laterality Date  . CARDIAC CATHETERIZATION N/A 10/19/2015   Procedure: Left Heart Cath and Coronary Angiography;  Surgeon: Charolette Forward, MD;  Location: [facility];  Service: Cardiovascular;  Laterality: N/A;  . CARDIAC CATHETERIZATION N/A 10/19/2015   Procedure: Coronary Stent Intervention;  Surgeon: Charolette Forward, MD;  Location: [facility];  Service: Cardiovascular;  Laterality: N/A;  . CATARACT EXTRACTION, BILATERAL Bilateral 01/2018   Dr. Kathrin Penner did surgery and he developed MD and is seeing specialist now for MD treatment  . COLONOSCOPY     "nothing showed up this time"  . COLONOSCOPY W/ BIOPSIES AND POLYPECTOMY  X 1  . CORONARY ANGIOPLASTY    . FEMUR IM NAIL Right 07/26/2013   Procedure: INTRAMEDULLARY (IM) NAIL FEMORAL subtrochanteric;  Surgeon: Mauri Pole, MD;  Location: [facility];  Service: Orthopedics;  Laterality: Right;  . FRACTURE SURGERY    . INGUINAL HERNIA REPAIR Left >3 times  . INGUINAL HERNIA REPAIR Right 2012  . NEPHRECTOMY Left 2000  . TONSILLECTOMY  1940s    Family History  Problem Relation Age of Onset  . Drug abuse Other   . Cancer Other   . Heart disease Other   . Lung disease Other   . Diabetes Mother   . Diabetes Sister   . Colon cancer Neg Hx   . Stomach cancer Neg Hx     No Known Allergies  Current Outpatient Medications on File Prior to Visit  Medication Sig Dispense Refill  . ASTAXANTHIN PO Take 1 tablet by mouth daily.    Marland Kitchen atorvastatin (LIPITOR) 40  MG tablet Take 1 tablet (40 mg total) by mouth daily at 6 PM. 30 tablet 3  . Bioflavonoid Products (VITAMIN C) CHEW Chew 1 tablet by mouth daily.     . Cyanocobalamin 5000 MCG TBDP Take 5,000 mcg by mouth every 3 (three) days.     . cyclobenzaprine (FLEXERIL) 10 MG tablet Take 1 tablet (10 mg total) by mouth at bedtime. 10 tablet 0  . diclofenac sodium (VOLTAREN) 1 % GEL Apply 4 g topically 4 (  four) times daily. 150 g 1  . EPINEPHrine (EPIPEN 2-PAK) 0.3 mg/0.3 mL DEVI Inject 0.3 mg into the muscle daily as needed (allergic reaction).     Marland Kitchen glipiZIDE (GLUCOTROL) 5 MG tablet TAKE 1 TABLET BY MOUTH 2  TIMES DAILY BEFORE A MEAL 180 tablet 3  . insulin NPH Human (NOVOLIN N RELION) 100 UNIT/ML injection Inject 14 units at bedtime under skin 10 mL 11  . Insulin Syringes, Disposable, U-100 0.5 ML MISC Use 1x a day 100 each 3  . lisinopril (PRINIVIL,ZESTRIL) 5 MG tablet Take 1 tablet (5 mg total) by mouth daily. 30 tablet 3  . lisinopril (ZESTRIL) 20 MG tablet Take 20 mg by mouth daily.    . metFORMIN (GLUCOPHAGE) 1000 MG tablet TAKE 1 TABLET BY MOUTH  TWICE DAILY WITH MEALS 180 tablet 3  . metoprolol succinate (TOPROL-XL) 50 MG 24 hr tablet Take 50 mg by mouth daily.     . Multiple Vitamins-Minerals (ICAPS PO) Take 1 tablet by mouth daily.    . Multiple Vitamins-Minerals (PRESERVISION AREDS 2 PO) Take 1 tablet by mouth daily.    Marland Kitchen neomycin-polymyxin-dexameth (MAXITROL) 0.1 % OINT Place 1 application into the left eye 2 (two) times daily.    . nitroGLYCERIN (NITROSTAT) 0.4 MG SL tablet Place 1 tablet (0.4 mg total) under the tongue every 5 (five) minutes as needed for chest pain. 25 tablet 1  . ONETOUCH DELICA LANCETS FINE MISC Use to check sugar daily 100 each 5  . ONETOUCH VERIO test strip USE 1 STRIP TO CHECK GLUCOSE ONCE DAILY 50 each 11  . sodium chloride (OCEAN) 0.65 % SOLN nasal spray Place 2 sprays into both nostrils 3 (three) times daily. 1 Bottle 0  . tamsulosin (FLOMAX) 0.4 MG CAPS capsule TAKE  1 CAPSULE BY MOUTH  DAILY 90 capsule 2  . vitamin E 400 UNIT capsule Take 400 Units by mouth daily.     No current facility-administered medications on file prior to visit.    BP (!) 146/92   Temp (!) 97.3 F (36.3 C)   Wt 251 lb (113.9 kg)   BMI 35.01 kg/m       Objective:   Physical Exam Vitals and nursing note reviewed.  Constitutional:      Appearance: Normal appearance. He is obese.  Pulmonary:     Effort: Pulmonary effort is normal.  Skin:    General: Skin is warm and dry.  Neurological:     General: No focal deficit present.     Mental Status: He is alert and oriented to person, place, and time.  Psychiatric:        Mood and Affect: Mood normal.        Behavior: Behavior normal.        Thought Content: Thought content normal.        Judgment: Judgment normal.        Assessment & Plan:  1. Left sided sciatica - Improving but not resolved. Will have him keep stretching over the next few days and if pain has not resolved he can start the second depo medrol pack. He does not want to go to PT at this time. MRI in the future if not resolved  - methylPREDNISolone (MEDROL DOSEPAK) 4 MG TBPK tablet; Take as directed  Dispense: 21 tablet; Refill: 0   [name], NP

## 2020-03-19 ENCOUNTER — Other Ambulatory Visit: Payer: Self-pay | Admitting: Internal Medicine

## 2020-04-06 DIAGNOSIS — E113293 Type 2 diabetes mellitus with mild nonproliferative diabetic retinopathy without macular edema, bilateral: Secondary | ICD-10-CM | POA: Diagnosis not present

## 2020-04-06 DIAGNOSIS — H35372 Puckering of macula, left eye: Secondary | ICD-10-CM | POA: Diagnosis not present

## 2020-04-06 DIAGNOSIS — H348111 Central retinal vein occlusion, right eye, with retinal neovascularization: Secondary | ICD-10-CM | POA: Diagnosis not present

## 2020-04-06 DIAGNOSIS — H353134 Nonexudative age-related macular degeneration, bilateral, advanced atrophic with subfoveal involvement: Secondary | ICD-10-CM | POA: Diagnosis not present

## 2020-05-03 ENCOUNTER — Encounter: Payer: Self-pay | Admitting: Internal Medicine

## 2020-05-03 ENCOUNTER — Other Ambulatory Visit: Payer: Self-pay

## 2020-05-03 ENCOUNTER — Ambulatory Visit: Payer: Medicare Other | Admitting: Internal Medicine

## 2020-05-03 VITALS — BP 120/88 | HR 84 | Ht 71.0 in | Wt 255.0 lb

## 2020-05-03 DIAGNOSIS — E1159 Type 2 diabetes mellitus with other circulatory complications: Secondary | ICD-10-CM | POA: Diagnosis not present

## 2020-05-03 DIAGNOSIS — E785 Hyperlipidemia, unspecified: Secondary | ICD-10-CM | POA: Diagnosis not present

## 2020-05-03 DIAGNOSIS — G6289 Other specified polyneuropathies: Secondary | ICD-10-CM | POA: Diagnosis not present

## 2020-05-03 DIAGNOSIS — E538 Deficiency of other specified B group vitamins: Secondary | ICD-10-CM

## 2020-05-03 DIAGNOSIS — E1165 Type 2 diabetes mellitus with hyperglycemia: Secondary | ICD-10-CM | POA: Diagnosis not present

## 2020-05-03 LAB — POCT GLYCOSYLATED HEMOGLOBIN (HGB A1C): Hemoglobin A1C: 7.3 % — AB (ref 4.0–5.6)

## 2020-05-03 NOTE — Progress Notes (Signed)
Patient ID: Charles [name], male   DOB: 1941/08/22, 79 y.o.   MRN: [id number]  This visit occurred during the SARS-CoV-2 public health emergency.  Safety protocols were in place, including screening questions prior to the visit, additional usage of staff PPE, and extensive cleaning of exam room while observing appropriate contact time as indicated for disinfecting solutions.   HPI: Charles [name] is a 79 y.o.-year-old male, returning for f/u for DM2 dx 2008, uncontrolled, with complications (CAD - s/p NSTEMI, PN, mild CKD). Last visit 4.5 months ago.  DM2: Reviewed HbA1c levels: Lab Results  Component Value Date   HGBA1C 7.4 (A) 12/22/2019   HGBA1C 8.0 (A) 08/19/2019   HGBA1C 7.6 (A) 05/06/2019  12/2013: 8.6%  Pt is on a regimen of: - Metformin 1000 mg 2x a day >> 2000 mg with dinner (but sugars worse) >> 1 mg 2x a day (may forget dose at night) - Glipizide 10 mg 2x a day before meals (may forget dose at night) At last OV, I recommended  NPH 14 units at bedtime - did not start! We tried Rybelsus 3 >> 6 mg daily-added 12/2018 >> $$$ >> stopped 01/2019 He checked with his insurance and none of the SGLT2 inhibitors are covered for him.  Pt checks his sugars once a day: - am:   148, 158-198, 220 >> 112, 120-220, 233 >> 112-160s, few 200s (pizza) - 2h after b'fast: 68x1 >> n/c >> 201, 206 >> n/c - before lunch:  n/c >> 69, 79 >> n/c >> 90 x1 - 2h after lunch: 123-176 >> n/c - before dinner  n/c >> 160s >> n/c - 2h after dinner: n/c >> 240 >> n/c - bedtime: 110-150 >> 160s >> n/c Lowest sugar was  147 >> 112 >> 90; he has hypoglycemia awareness in the 80s. Highest sugar was 205 (missed medication) >> 220 >> 200s.  -+ Mild CKD, last BUN/creatinine:  Lab Results  Component Value Date   BUN 15 10/08/2019   CREATININE 1.02 10/08/2019  Of note, he has a history of nephrectomy. On lisinopril 10.  -+ HL; last set of lipids: Lab Results  Component Value Date   CHOL 127 10/08/2019   HDL  46.10 10/08/2019   LDLCALC 55 10/08/2019   LDLDIRECT 139.8 07/08/2014   TRIG 130.0 10/08/2019   CHOLHDL 3 10/08/2019  On Lipitor 40. - last eye exam was in 01/2020: No DR; now seeing Dr. [place] - retinal vein occlusion, + cataracts, + macular "hole".  He has a history of eye surgeries.  He gets intraocular injections. -+ Improved numbness and tingling in his feet  B12 deficiency: -Diagnosed during investigation for peripheral neuropathy -He was initially on B12 injections, then on 5000 mcg daily -At last visit was on 5000 mcg every 3 days, changed by PCP in 09/2019. He continues on this dose.  Reviewed B12 levels: Lab Results  Component Value Date   VITAMINB12 748 12/22/2019   VITAMINB12 1,033 (H) 10/08/2019   VITAMINB12 1,172 (H) 08/19/2018   VITAMINB12 >1500 (H) 05/04/2018   VITAMINB12 1,249 (H) 02/18/2017   VITAMINB12 234 10/22/2016   VITAMINB12 217 04/19/2016   VITAMINB12 94 (L) 10/13/2015   He was admitted with NSTEMI 10/18/2015.  He had stents placed then.  He was going to the gym 5 out of 7 days, but stopped during the coronavirus pandemic.  Started Tamsulozin.  ROS: Constitutional: no weight gain/no weight loss, no fatigue, no subjective hyperthermia, no subjective hypothermia Eyes: no blurry vision, no  xerophthalmia ENT: no sore throat, no nodules palpated in neck, no dysphagia, no odynophagia, no hoarseness Cardiovascular: no CP/no SOB/no palpitations/no leg swelling Respiratory: no cough/no SOB/no wheezing Gastrointestinal: no N/no V/no D/no C/no acid reflux Musculoskeletal: no muscle aches/no joint aches Skin: no rashes, no hair loss Neurological: no tremors/no numbness/no tingling/no dizziness  I reviewed pt's medications, allergies, PMH, social hx, family hx, and changes were documented in the history of present illness. Otherwise, unchanged from my initial visit note.  Past Medical History:  Diagnosis Date   Anemia    "when I was a lad"   Arthritis     "right femur" (10/19/2015)   AVM (arteriovenous malformation) of colon    2 - non-bleeding 2013   Biceps muscle tear    right   Cancer of kidney ([place]) 2000   left nephrectomy   Coronary artery disease    Eczema    GERD (gastroesophageal reflux disease)    Hernia, ventral    Hyperlipidemia    Hypertension    Obesity    Personal history of adenomatous colonic polyps 07/20/2012   3 + adenomas 2009 07/20/2012 2 diminutive polyps     Pneumonia 1942; 1950s X 1   Type II diabetes mellitus ([place])    Walking pneumonia 2000's X 1   Past Surgical History:  Procedure Laterality Date   CARDIAC CATHETERIZATION N/A 10/19/2015   Procedure: Left Heart Cath and Coronary Angiography;  Surgeon: Charolette Forward, MD;  Location: [facility];  Service: Cardiovascular;  Laterality: N/A;   CARDIAC CATHETERIZATION N/A 10/19/2015   Procedure: Coronary Stent Intervention;  Surgeon: Charolette Forward, MD;  Location: [facility];  Service: Cardiovascular;  Laterality: N/A;   CATARACT EXTRACTION, BILATERAL Bilateral 01/2018   Dr. Kathrin Penner did surgery and he developed MD and is seeing specialist now for MD treatment   COLONOSCOPY     "nothing showed up this time"   COLONOSCOPY W/ BIOPSIES AND POLYPECTOMY  X 1   CORONARY ANGIOPLASTY     FEMUR IM NAIL Right 07/26/2013   Procedure: INTRAMEDULLARY (IM) NAIL FEMORAL subtrochanteric;  Surgeon: Mauri Pole, MD;  Location: [facility];  Service: Orthopedics;  Laterality: Right;   FRACTURE SURGERY     INGUINAL HERNIA REPAIR Left >3 times   INGUINAL HERNIA REPAIR Right 2012   NEPHRECTOMY Left 2000   TONSILLECTOMY  1940s   Social History   Socioeconomic History   Marital status: Widowed    Spouse name: Not on file   Number of children: 3   Years of education: Not on file   Highest education level: Not on file  Occupational History   Occupation: retired  Tobacco Use   Smoking status: Former Smoker    Years: 20.00    Types:  Cigarettes, Pipe, Cigars    Quit date: 09/09/1968    Years since quitting: 51.6   Smokeless tobacco: Never Used   Tobacco comment: smoking cigars- quit in 13   Substance and Sexual Activity   Alcohol use: Yes    Alcohol/week: 1.0 standard drink    Types: 1 Cans of beer per week    Comment: occasionally    Drug use: No   Sexual activity: Never  Other Topics Concern   Not on file  Social History Narrative   Regular exercise: [facility] to the [facility] and walks   Caffeine use: daily; coffee   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     widowed    Three children, One  in [place], one in [place] one in [place] - 2 : youngest daughter lives with him   Social Determinants of Health   Financial Resource Strain: Low Risk    Difficulty of Paying Living Expenses: Not hard at all  Food Insecurity:    Worried About Charity fundraiser in the Last Year: Not on file   [employer] of Food in the Last Year: Not on file  Transportation Needs: No Transportation Needs   Lack of Transportation (Medical): No   Lack of Transportation (Non-Medical): No  Physical Activity: Sufficiently Active   Days of Exercise per Week: 5 days   Minutes of Exercise per Session: 50 min  Stress: Stress Concern Present   Feeling of Stress : To some extent  Social Connections: Moderately Integrated   Frequency of Communication with Friends and Family: More than three times a week   Frequency of Social Gatherings with Friends and Family: More than three times a week   Attends Religious Services: More than 4 times per year   Active Member of [employer] or Organizations: Yes   Attends Archivist Meetings: More than 4 times per year   Marital Status: Widowed  Intimate Partner Violence:    Fear of Current or Ex-Partner: Not on file   Emotionally Abused: Not on file   Physically Abused: Not on file   Sexually Abused: Not on file   Current Outpatient Medications on File Prior to Visit   Medication Sig Dispense Refill   ASTAXANTHIN PO Take 1 tablet by mouth daily.     atorvastatin (LIPITOR) 40 MG tablet Take 1 tablet (40 mg total) by mouth daily at 6 PM. 30 tablet 3   Bioflavonoid Products (VITAMIN C) CHEW Chew 1 tablet by mouth daily.      Cyanocobalamin 5000 MCG TBDP Take 5,000 mcg by mouth every 3 (three) days.      cyclobenzaprine (FLEXERIL) 10 MG tablet Take 1 tablet (10 mg total) by mouth at bedtime. 10 tablet 0   diclofenac sodium (VOLTAREN) 1 % GEL Apply 4 g topically 4 (four) times daily. 150 g 1   EPINEPHrine (EPIPEN 2-PAK) 0.3 mg/0.3 mL DEVI Inject 0.3 mg into the muscle daily as needed (allergic reaction).      glipiZIDE (GLUCOTROL) 5 MG tablet TAKE 1 TABLET BY MOUTH 2  TIMES DAILY BEFORE A MEAL 180 tablet 3   insulin NPH Human (NOVOLIN N RELION) 100 UNIT/ML injection Inject 14 units at bedtime under skin 10 mL 11   Insulin Syringes, Disposable, U-100 0.5 ML MISC Use 1x a day 100 each 3   lisinopril (PRINIVIL,ZESTRIL) 5 MG tablet Take 1 tablet (5 mg total) by mouth daily. 30 tablet 3   lisinopril (ZESTRIL) 20 MG tablet Take 20 mg by mouth daily.     metFORMIN (GLUCOPHAGE) 1000 MG tablet TAKE 1 TABLET BY MOUTH  TWICE DAILY WITH MEALS 180 tablet 3   methylPREDNISolone (MEDROL DOSEPAK) 4 MG TBPK tablet Take as directed 21 tablet 0   metoprolol succinate (TOPROL-XL) 50 MG 24 hr tablet Take 50 mg by mouth daily.      Multiple Vitamins-Minerals (ICAPS PO) Take 1 tablet by mouth daily.     Multiple Vitamins-Minerals (PRESERVISION AREDS 2 PO) Take 1 tablet by mouth daily.     neomycin-polymyxin-dexameth (MAXITROL) 0.1 % OINT Place 1 application into the left eye 2 (two) times daily.     nitroGLYCERIN (NITROSTAT) 0.4 MG SL tablet Place 1 tablet (0.4 mg total)  under the tongue every 5 (five) minutes as needed for chest pain. 25 tablet 1   ONETOUCH DELICA LANCETS FINE MISC Use to check sugar daily 100 each 5   ONETOUCH VERIO test strip USE 1 STRIP TO  CHECK GLUCOSE ONCE DAILY 50 each 11   sodium chloride (OCEAN) 0.65 % SOLN nasal spray Place 2 sprays into both nostrils 3 (three) times daily. 1 Bottle 0   tamsulosin (FLOMAX) 0.4 MG CAPS capsule TAKE 1 CAPSULE BY MOUTH  DAILY 90 capsule 2   vitamin E 400 UNIT capsule Take 400 Units by mouth daily.     No current facility-administered medications on file prior to visit.   No Known Allergies Family History  Problem Relation Age of Onset   Drug abuse Other    Cancer Other    Heart disease Other    Lung disease Other    Diabetes Mother    Diabetes Sister    Colon cancer Neg Hx    Stomach cancer Neg Hx     PE: BP 120/88    Pulse 84    Ht 5\' 11"  (1.803 m)    Wt 255 lb (115.7 kg)    SpO2 95%    BMI 35.57 kg/m  Wt Readings from Last 3 Encounters:  05/03/20 255 lb (115.7 kg)  03/08/20 251 lb (113.9 kg)  02/09/20 250 lb (113.4 kg)   Constitutional: overweight, in NAD Eyes: PERRLA, EOMI, no exophthalmos ENT: moist mucous membranes, no thyromegaly, no cervical lymphadenopathy Cardiovascular: RRR, No MRG, + R>L LE swelling Respiratory: CTA B Gastrointestinal: abdomen soft, NT, ND, BS+ Musculoskeletal: no deformities, strength intact in all 4 Skin: moist, warm, no rashes Neurological: no tremor with outstretched hands, DTR normal in all 4  ASSESSMENT: 1. DM2, insulin-dependent but not on insulin, uncontrolled, with complications - CAD - s/p NSTEMI (Dr. Terrence Dupont) - peripheral neuropathy - CKD  2. B12 def  3.  Peripheral neuropathy  4. HL  PLAN:  1. Patient with longstanding, uncontrolled, type 2 diabetes, with improved control at last visit.  At that time, HbA1c was 7.4%, decreased from 8%.   -Unfortunately, he cannot afford any higher tier medications.  I did recommend insulin in the past but he did not start it. -Sugars were very variable but they were quite high in the morning, even in the 200s after eating heavier dinners or snacking after dinner.  We discussed  about the absolute need to stop eating at night. -At this visit, sugars better in the morning, with only few values in the 200s, usually after large meal, for example pizza with dinner.  Also, he reports that he sometimes forgets to take his nighttime medications.  He is not checking sugars later in the day usually.  He only checked once before lunch and at that time his CBG was 90.  We discussed that if this keeps happening, we may need to reduce his glipizide in the morning.  We also discussed about the need to improve diet for both diabetes and weight control.  Otherwise, for now, we will continue the current regimen. - I advised him to: Patient Instructions   Please continue: - Metformin 1000 mg 2x a day with meals - Glipizide 10 mg 2x a day before meals  Try not to forget the medication doses at night.  Please return in 4 months with your sugar log.  - we checked his HbA1c: 7.3% (slightly better) - advised to check sugars at different times of the day -  1x a day, rotating check times - advised for yearly eye exams >> he is UTD - return to clinic in 3-4 months      2.  And 3. Peripheral neuropathy and B12 deficiency -His peripheral neuropathy is possibly related to diabetes and B12 deficiency -We initially started him on intramuscular injections of B12, then switched to 5000 mcg B12 daily -As his B12 level was high on this dose, PCP suggested to move the dose every 3 days.  At last visit I advised him that he can either do this or start taking 1000 mcg every day. -At last visit B12 level was normal -we will not repeat this today  4. HL -Reviewed latest lipid panel from 09/2019: LDL much improved, now at goal, as are the rest of his lipid fractions: Lab Results  Component Value Date   CHOL 127 10/08/2019   HDL 46.10 10/08/2019   LDLCALC 55 10/08/2019   LDLDIRECT 139.8 07/08/2014   TRIG 130.0 10/08/2019   CHOLHDL 3 10/08/2019  -Continues Lipitor without side effects   Philemon Kingdom, MD PhD [facility] Endocrinology

## 2020-05-03 NOTE — Addendum Note (Signed)
Addended by: Cardell Peach I on: 05/03/2020 10:52 AM   Modules accepted: Orders

## 2020-05-03 NOTE — Patient Instructions (Signed)
Please continue: - Metformin 1000 mg 2x a day with meals - Glipizide 10 mg 2x a day before meals  Try not to forget the medication doses at night.  Please return in 4 months with your sugar log.

## 2020-05-17 ENCOUNTER — Ambulatory Visit: Payer: Medicare Other | Admitting: Pharmacist

## 2020-05-17 DIAGNOSIS — I10 Essential (primary) hypertension: Secondary | ICD-10-CM

## 2020-05-17 DIAGNOSIS — E785 Hyperlipidemia, unspecified: Secondary | ICD-10-CM

## 2020-05-17 NOTE — Chronic Care Management (AMB) (Signed)
Chronic Care Management Pharmacy  Name: Charles [name]  MRN: [id number] DOB: 28-Oct-1940  Initial Questions: 1. Have you seen any other providers since your last visit? NA 2. Any changes in your medicines or health? Yes - Toprol increased by cardiologist  Chief Complaint/ HPI Charles [name],  79 y.o. , male presents for their Follow-Up CCM visit with the clinical pharmacist via telephone due to COVID-19 Pandemic.  PCP : [name], NP  Their chronic conditions include: DM, HTN, HLD, History of NSTEMI (2017), vitamin B12 deficiency, right sided sciatica,. BPH  Office Visits: 03/08/2020 - [name], NP- Patient presented with left sided sciatica. Patient reported short course of Flexeril and Medrol dosepak helped ease the pain but did not resolve it. Pt reports pain has improved further with stretching exercises at home and walking at the [facility]. Prescribed Medrol dosepak.   6/02/2021[name], NP- Patient presented for office visit for pain radiating down the outside of his left leg. For left sided sciatica, patient advised to use warm compress as well as gentle stretching exercises. Patient prescribed Flexeril at night and Medrol Dosepak.   2/09/2021[name], NP- Patient presented for virtual visit for concerns of BPH (symptoms reported: decreased stream, nocturia 3 times a night, incomplete bladder emptying, and urinary frequency. Patient to trial tamulosin 0.4mg  daily. Patient to follow up if symptoms have not improved.   Consult Visit:  05/03/2020 - Endocrinology- Henrine Screws, MD - Patient presented for DM follow up. A1C improved slightly from 7.4  to 7.3%. Patient did not start taking NPH 14 units at bedtime and admits to forgetting night time doses of glipizide and metformin.   12/22/2019- Endocrinology- Henrine Screws, MD- Patient presented for office visit for DM follow up. No major interventions made. Patient opted to work on diet modifications to improve BG  readings. Patient to continue metformin 1000mg  twice daily with meals and glipizide 10mg  twice daily before meals. Patient to check sugars later in the day as well. Patient to return in 3 to 4 months with sugar log.   Medications: Outpatient Encounter Medications as of 05/17/2020  Medication Sig Note  . ASTAXANTHIN PO Take 1 tablet by mouth daily.   Marland Kitchen atorvastatin (LIPITOR) 40 MG tablet Take 1 tablet (40 mg total) by mouth daily at 6 PM.   . Bioflavonoid Products (VITAMIN C) CHEW Chew 1 tablet by mouth daily.    . Cyanocobalamin 5000 MCG TBDP Take 5,000 mcg by mouth every 3 (three) days.    . cyclobenzaprine (FLEXERIL) 10 MG tablet Take 1 tablet (10 mg total) by mouth at bedtime.   . diclofenac sodium (VOLTAREN) 1 % GEL Apply 4 g topically 4 (four) times daily.   Marland Kitchen EPINEPHrine (EPIPEN 2-PAK) 0.3 mg/0.3 mL DEVI Inject 0.3 mg into the muscle daily as needed (allergic reaction).    Marland Kitchen glipiZIDE (GLUCOTROL) 5 MG tablet TAKE 1 TABLET BY MOUTH 2  TIMES DAILY BEFORE A MEAL   . insulin NPH Human (NOVOLIN N RELION) 100 UNIT/ML injection Inject 14 units at bedtime under skin   . Insulin Syringes, Disposable, U-100 0.5 ML MISC Use 1x a day   . lisinopril (PRINIVIL,ZESTRIL) 5 MG tablet Take 1 tablet (5 mg total) by mouth daily. 02/16/2020: Dose increased to 20mg   . lisinopril (ZESTRIL) 20 MG tablet Take 20 mg by mouth daily.   . metFORMIN (GLUCOPHAGE) 1000 MG tablet TAKE 1 TABLET BY MOUTH  TWICE DAILY WITH MEALS   . methylPREDNISolone (MEDROL DOSEPAK) 4 MG  TBPK tablet Take as directed   . metoprolol succinate (TOPROL-XL) 100 MG 24 hr tablet Take 100 mg by mouth daily.    . Multiple Vitamins-Minerals (ICAPS PO) Take 1 tablet by mouth daily.   . Multiple Vitamins-Minerals (PRESERVISION AREDS 2 PO) Take 1 tablet by mouth daily.   Marland Kitchen neomycin-polymyxin-dexameth (MAXITROL) 0.1 % OINT Place 1 application into the left eye 2 (two) times daily.   . nitroGLYCERIN (NITROSTAT) 0.4 MG SL tablet Place 1 tablet (0.4 mg  total) under the tongue every 5 (five) minutes as needed for chest pain.   Glory Rosebush DELICA LANCETS FINE MISC Use to check sugar daily   . ONETOUCH VERIO test strip USE 1 STRIP TO CHECK GLUCOSE ONCE DAILY   . sodium chloride (OCEAN) 0.65 % SOLN nasal spray Place 2 sprays into both nostrils 3 (three) times daily.   . tamsulosin (FLOMAX) 0.4 MG CAPS capsule TAKE 1 CAPSULE BY MOUTH  DAILY   . vitamin E 400 UNIT capsule Take 400 Units by mouth daily.    No facility-administered encounter medications on file as of 05/17/2020.     Current Diagnosis/Assessment:  Goals Addressed            This Visit's Progress   . Pharmacy Care Plan       CARE PLAN ENTRY  Current Barriers:  . Chronic Disease Management support, education, and care coordination needs related to Hypertension, Hyperlipidemia, Diabetes, and History of NSTEMI (2017), vitamin B12 deficiency, Right sided sciatica, BPH   Hypertension . Pharmacist Clinical Goal(s): o Over the next 120 days, patient will work with PharmD and providers to maintain BP goal <130/80 . Current regimen:   Lisinopril 20 mg, 1 tablet once daily  Metoprolol succinate (Toprol-XL) 100mg , 1 tablet once daily . Interventions: o We discussed monitoring BP at home and providing readings at next cardiology visit for further assessment. . Patient self care activities - Over the next 120 days, patient will: o Recommended to check BP at home, document, and provide at future appointments o Ensure daily salt intake < 2300 mg/day  Hyperlipidemia/ History of NSTEMI, 2017 . Pharmacist Clinical Goal(s): o Over the next 120 days, patient will work with PharmD and providers to maintain LDL goal < 70 . Current regimen:  . Atorvastatin 40mg , 1 tablet daily at 6pm . Nitroglycerin 0.4mg  SL, 1 tablet under tongue every 5 minutes as needed for chest pain  . Patient self care activities - Over the next 90 days, patient will: o Continue medications as directed. o Consult  with cardiologist if nitroglycerin is still needed and request refill as indicated.    Diabetes . Pharmacist Clinical Goal(s): o Over the next 120 days, patient will work with PharmD and providers to achieve A1c goal <7% . Current regimen:   Glipizide 5mg , 1 tablet twice daily with a meal  Metformin 1000mg , 1 tablet twice daily with meals  . Interventions: o We discussed importance of BG monitoring as instructed and alternating times of day in order to see full picture for blood sugars . Patient self care activities - Over the next 120 days, patient will: o Check blood sugar as instructed document, and provide at future appointments o Work on remembering to taking evening doses of glipizide and metformin and consider the use of an alarm to help as a reminder  Vitamin B12 deficiency (resolved) . Pharmacist Clinical Goal(s) o Over the next 120 days, patient will work with PharmD and providers to maintain vitamin B12 level: 211 -  911 pg/mL . Current regimen:  o Cyanocobalamin 5037mcg, 5,091mcg every 3rd day . Patient self care activities o Patient will continue medications as directed.   Right sided sciatica  . Pharmacist Clinical Goal(s) o Over the next 120 days, patient will work with PharmD and providers to ease the pain from sciatica . Patient self care activities o Patient will restart using stretching exercises to help with the pain  BPH (Benign prostatic hyperplasia)  . Pharmacist Clinical Goal(s) o Over the next 90 days, patient will work with PharmD and providers to minimize urinary symptoms. . Current regimen:  o tamsulosin 0.4mg , 1 capsule once daily . Patient self care activities o Patient will continue medications as directed.   Medication management . Pharmacist Clinical Goal(s): o Over the next 90 days, patient will work with PharmD and providers to achieve optimal medication adherence . Current pharmacy: [facility] . Interventions o Comprehensive  medication review performed. o Continue current medication management strategy (pill box).  . Patient self care activities - Over the next 90 days, patient will: o Take medications as prescribed o Report any questions or concerns to PharmD and/or provider(s)  Please see past updates related to this goal by clicking on the "Past Updates" button in the selected goal          SDOH Interventions     Most Recent Value  SDOH Interventions  Financial Strain Interventions Intervention Not Indicated  Transportation Interventions Intervention Not Indicated       Diabetes   Recent Relevant Labs: Lab Results  Component Value Date/Time   HGBA1C 7.3 (A) 05/03/2020 10:52 AM   HGBA1C 7.4 (A) 12/22/2019 11:14 AM   HGBA1C 7.9 (H) 09/29/2018 02:23 AM   HGBA1C 6.9 (H) 08/08/2016 08:56 AM   MICROALBUR 13.8 (H) 08/08/2016 08:56 AM   MICROALBUR 11.4 (H) 07/21/2015 08:37 AM    Checking BG: Daily   Average FBG Readings: 110-150 mg/dl   Patient has failed these meds in past: Rybelsus (previous high copay); Insulin NPH (Novolin N)- patient prefers to work on diet modifications  Patient is currently uncontrolled on the following medications:   Glipizide 5mg , 1 tablet twice daily with a meal  Metformin 1000mg , 1 tablet twice daily with meals   Last diabetic eye exam:  Lab Results  Component Value Date/Time   HMDIABEYEEXA No Retinopathy 11/12/2017 12:00 AM     Last diabetic foot exam:  Lab Results  Component Value Date/Time   HMDIABFOOTEX yes 11/24/2009 12:00 AM     We discussed: importance of BG monitoring and alternating times of day to assess complete BG control; medication adherence; benefits of CGM; patient does not qualify for patient assistance for Rybelsus and other DM meds  Plan Managed by endo (Dr. Cruzita Lederer) and has a follow up in Dec 2021. Continue current medications. Consider recommending CGM for patient if A1C continues to be elevated. Patient will work on medication  adherence and alternating time of day he is checking BGs.  Hypertension  Denies dizziness/ lightheadedness.   Office blood pressures are  BP Readings from Last 3 Encounters:  05/03/20 120/88  03/08/20 (!) 146/92  02/09/20 108/70   Patient has failed these meds in the past: none   Patient checks BP at home: does not monitor at home.   Patient home BP readings are ranging: NA  Patient is controlled on:   Lisinopril 20 mg, 1 tablet once daily  Metoprolol succinate (Toprol-XL) 100mg , 1 tablet once daily  We discussed monitoring BP  at home and providing readings at next cardiology visit for further assessment.   Plan Managed by cardiologist. Continue current medications  Monitor BP at home and bring to next cardiology appointment.  Hyperlipidemia/ History of NSTEMI, 2017   Nitro- states has never needed to use. Most likely expired.   Lipid Panel     Component Value Date/Time   CHOL 127 10/08/2019 0949   TRIG 130.0 10/08/2019 0949   TRIG 149 07/25/2006 0854   HDL 46.10 10/08/2019 0949   LDLCALC 55 10/08/2019 0949   LDLDIRECT 139.8 07/08/2014 0845     The ASCVD Risk score (Goff DC Jr., et al., 2013) failed to calculate for the following reasons:   The patient has a prior MI or stroke diagnosis   Patient has failed these meds in past: none  Patient is currently controlled on the following medications:  . Atorvastatin 40mg , 1 tablet daily at 6pm . Nitroglycerin 0.4mg  SL, 1 tablet under tongue every 5 minutes as needed for chest pain   Plan Continue current medications  Vitamin B12 deficiency (resolved)   Vitamin B12: 748 (12/22/2019)  Patient is currently controlled on the following medications:   Cyanocobalamin 5077mcg, 5,015mcg every 3rd day  Plan Continue current medications  Right sided sciatica    Patient is currently uncontrolled on the following medications:  . None currently   Plan Continue current medications. Patient to continue incorporating  more stretching exercises, especially prior to walking to ease pain.   BPH  Patient reports improvement since being on this.    Patient is currently controlled on the following medications:  . tamsulosin 0.4mg , 1 capsule once daily  Plan Continue current medications  OTC/ supplements  . Preservision Areds 2, 1 tablet once daily  . Astaxanthin, 1 tablet once daily   We discussed:   - astaxanthin and zeaxanthin (in Areds 2) are both carotenoids. Patient preferred to continue both.   Plan Continue current medications   Medication Management  Patient organizes medications: patient uses pill box  Primary pharmacy: [facility] Adherence: patient reports missing night time medicines 2-3 times per month due to lack of a routine  We discussed: setting a timer to remember to take night time medications  Follow up Follow up visit with PharmD in 4 months (after endocrinologist visit).    Jeni Salles, PharmD Clinical Pharmacist [facility]

## 2020-05-25 DIAGNOSIS — H353133 Nonexudative age-related macular degeneration, bilateral, advanced atrophic without subfoveal involvement: Secondary | ICD-10-CM | POA: Diagnosis not present

## 2020-05-25 DIAGNOSIS — H35372 Puckering of macula, left eye: Secondary | ICD-10-CM | POA: Diagnosis not present

## 2020-05-25 DIAGNOSIS — H34811 Central retinal vein occlusion, right eye, with macular edema: Secondary | ICD-10-CM | POA: Diagnosis not present

## 2020-05-25 DIAGNOSIS — E113293 Type 2 diabetes mellitus with mild nonproliferative diabetic retinopathy without macular edema, bilateral: Secondary | ICD-10-CM | POA: Diagnosis not present

## 2020-05-29 ENCOUNTER — Encounter: Payer: Self-pay | Admitting: Adult Health

## 2020-06-29 DIAGNOSIS — E785 Hyperlipidemia, unspecified: Secondary | ICD-10-CM | POA: Diagnosis not present

## 2020-06-29 DIAGNOSIS — I2510 Atherosclerotic heart disease of native coronary artery without angina pectoris: Secondary | ICD-10-CM | POA: Diagnosis not present

## 2020-06-29 DIAGNOSIS — I10 Essential (primary) hypertension: Secondary | ICD-10-CM | POA: Diagnosis not present

## 2020-06-29 DIAGNOSIS — E119 Type 2 diabetes mellitus without complications: Secondary | ICD-10-CM | POA: Diagnosis not present

## 2020-07-13 DIAGNOSIS — E113293 Type 2 diabetes mellitus with mild nonproliferative diabetic retinopathy without macular edema, bilateral: Secondary | ICD-10-CM | POA: Diagnosis not present

## 2020-07-13 DIAGNOSIS — H35372 Puckering of macula, left eye: Secondary | ICD-10-CM | POA: Diagnosis not present

## 2020-07-13 DIAGNOSIS — H34811 Central retinal vein occlusion, right eye, with macular edema: Secondary | ICD-10-CM | POA: Diagnosis not present

## 2020-07-13 DIAGNOSIS — H353133 Nonexudative age-related macular degeneration, bilateral, advanced atrophic without subfoveal involvement: Secondary | ICD-10-CM | POA: Diagnosis not present

## 2020-08-08 NOTE — Progress Notes (Signed)
[place] Phone: [phone] Subjective:   Charles [name], am serving as a scribe for Dr. Hulan Saas. This visit occurred during the SARS-CoV-2 public health emergency.  Safety protocols were in place, including screening questions prior to the visit, additional usage of staff PPE, and extensive cleaning of exam room while observing appropriate contact time as indicated for disinfecting solutions.   I'm seeing this patient by the request  of:  [name], NP  CC: Left-sided back pain and leg pain  [id number]  Charles [name] is a 79 y.o. male coming in with complaint of left side pain and leg pain. Patient last seen in 2018 for lumbar spinal stenosis. Patient states that he has been having pain in left glute and GT. Pain went away with stretching. Patient has tried stretching recently and the pain has not gone away. Pain with stair climbing, lying on left side and sitting in the car. Radiates down to left foot. Not taking anything for pain.  Patient has seen me previously for this.  States that it does seem somewhat similar.  Affecting sleep which is the main concern.  Xray lumbar 07/2017 IMPRESSION: 1. [name] acute findings of the lumbar spine. 2. Multilevel disc osteophytic disease.    Past Medical History:  Diagnosis Date  . Anemia    "when I was a lad"  . Arthritis    "right femur" (10/19/2015)  . AVM (arteriovenous malformation) of colon    2 - non-bleeding 2013  . Biceps muscle tear    right  . Cancer of kidney ([place]) 2000   left nephrectomy  . Coronary artery disease   . Eczema   . GERD (gastroesophageal reflux disease)   . Hernia, ventral   . Hyperlipidemia   . Hypertension   . Obesity   . Personal history of adenomatous colonic polyps 07/20/2012   3 + adenomas 2009 07/20/2012 2 diminutive polyps    . Pneumonia 1942; 1950s X 1  . Type II diabetes mellitus ([place])   . Walking pneumonia 2000's X  1   Past Surgical History:  Procedure Laterality Date  . CARDIAC CATHETERIZATION N/A 10/19/2015   Procedure: Left Heart Cath and Coronary Angiography;  Surgeon: Charolette Forward, MD;  Location: [facility];  Service: Cardiovascular;  Laterality: N/A;  . CARDIAC CATHETERIZATION N/A 10/19/2015   Procedure: Coronary Stent Intervention;  Surgeon: Charolette Forward, MD;  Location: [facility];  Service: Cardiovascular;  Laterality: N/A;  . CATARACT EXTRACTION, BILATERAL Bilateral 01/2018   Dr. Kathrin Penner did surgery and he developed MD and is seeing specialist now for MD treatment  . COLONOSCOPY     "nothing showed up this time"  . COLONOSCOPY W/ BIOPSIES AND POLYPECTOMY  X 1  . CORONARY ANGIOPLASTY    . FEMUR IM NAIL Right 07/26/2013   Procedure: INTRAMEDULLARY (IM) NAIL FEMORAL subtrochanteric;  Surgeon: Mauri Pole, MD;  Location: [facility];  Service: Orthopedics;  Laterality: Right;  . FRACTURE SURGERY    . INGUINAL HERNIA REPAIR Left >3 times  . INGUINAL HERNIA REPAIR Right 2012  . NEPHRECTOMY Left 2000  . TONSILLECTOMY  1940s   Social History   Socioeconomic History  . Marital status: Widowed    Spouse name: Not on file  . Number of children: 3  . Years of education: Not on file  . Highest education level: Not on file  Occupational History  . Occupation: retired  Tobacco  Use  . Smoking status: Former Smoker    Years: 20.00    Types: Cigarettes, Pipe, Cigars    Quit date: 09/09/1968    Years since quitting: 51.9  . Smokeless tobacco: Never Used  . Tobacco comment: smoking cigars- quit in 13   Substance and Sexual Activity  . Alcohol use: Yes    Alcohol/week: 1.0 standard drink    Types: 1 Cans of beer per week    Comment: occasionally   . Drug use: [name]  . Sexual activity: Never  Other Topics Concern  . 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     widowed    Three children, One in [place], one in [place] one in [place] - 2 : youngest daughter lives with him   Social Determinants of Health   Financial Resource Strain: Low Risk   . Difficulty of Paying Living Expenses: Not hard at all  Food Insecurity:   . Worried About Charity fundraiser in the Last Year: Not on file  . Ran Out of Food in the Last Year: Not on file  Transportation Needs: [name] Transportation Needs  . Lack of Transportation (Medical): [name]  . Lack of Transportation (Non-Medical): [name]  Physical Activity: Sufficiently Active  . Days of Exercise per Week: 5 days  . Minutes of Exercise per Session: 50 min  Stress: Stress Concern Present  . Feeling of Stress : To some extent  Social Connections: Moderately Integrated  . Frequency of Communication with Friends and Family: More than three times a week  . Frequency of Social Gatherings with Friends and Family: More than three times a week  . Attends Religious Services: More than 4 times per year  . Active Member of Clubs or Organizations: Yes  . Attends Archivist Meetings: More than 4 times per year  . Marital Status: Widowed   [name] Known Allergies Family History  Problem Relation Age of Onset  . Drug abuse Other   . Cancer Other   . Heart disease Other   . Lung disease Other   . Diabetes Mother   . Diabetes Sister   . Colon cancer Neg Hx   . Stomach cancer Neg Hx     Current Outpatient Medications (Endocrine & Metabolic):  .  glipiZIDE (GLUCOTROL) 5 MG tablet, TAKE 1 TABLET BY MOUTH 2  TIMES DAILY BEFORE A MEAL .  insulin NPH Human (NOVOLIN N RELION) 100 UNIT/ML injection, Inject 14 units at bedtime under skin .  metFORMIN (GLUCOPHAGE) 1000 MG tablet, TAKE 1 TABLET BY MOUTH  TWICE DAILY WITH MEALS .  methylPREDNISolone (MEDROL DOSEPAK) 4 MG TBPK tablet, Take as directed  Current Outpatient Medications (Cardiovascular):  .  atorvastatin (LIPITOR) 40 MG tablet, Take 1 tablet (40 mg  total) by mouth daily at 6 PM. .  EPINEPHrine (EPIPEN 2-PAK) 0.3 mg/0.3 mL DEVI, Inject 0.3 mg into the muscle daily as needed (allergic reaction).  Marland Kitchen  lisinopril (PRINIVIL,ZESTRIL) 5 MG tablet, Take 1 tablet (5 mg total) by mouth daily. Marland Kitchen  lisinopril (ZESTRIL) 20 MG tablet, Take 20 mg by mouth daily. .  metoprolol succinate (TOPROL-XL) 100 MG 24 hr tablet, Take 100 mg by mouth daily.  .  nitroGLYCERIN (NITROSTAT) 0.4 MG SL tablet, Place 1 tablet (0.4 mg total) under the tongue every 5 (five) minutes as  needed for chest pain.  Current Outpatient Medications (Respiratory):  .  sodium chloride (OCEAN) 0.65 % SOLN nasal spray, Place 2 sprays into both nostrils 3 (three) times daily.   Current Outpatient Medications (Hematological):  Marland Kitchen  Cyanocobalamin 5000 MCG TBDP, Take 5,000 mcg by mouth every 3 (three) days.   Current Outpatient Medications (Other):  Marland Kitchen  ASTAXANTHIN PO, Take 1 tablet by mouth daily. Marland Kitchen  Bioflavonoid Products (VITAMIN C) CHEW, Chew 1 tablet by mouth daily.  .  cyclobenzaprine (FLEXERIL) 10 MG tablet, Take 1 tablet (10 mg total) by mouth at bedtime. .  diclofenac sodium (VOLTAREN) 1 % GEL, Apply 4 g topically 4 (four) times daily. .  Insulin Syringes, Disposable, U-100 0.5 ML MISC, Use 1x a day .  Multiple Vitamins-Minerals (ICAPS PO), Take 1 tablet by mouth daily. .  Multiple Vitamins-Minerals (PRESERVISION AREDS 2 PO), Take 1 tablet by mouth daily. Marland Kitchen  neomycin-polymyxin-dexameth (MAXITROL) 0.1 % OINT, Place 1 application into the left eye 2 (two) times daily. Glory Rosebush DELICA LANCETS FINE MISC, Use to check sugar daily .  ONETOUCH VERIO test strip, USE 1 STRIP TO CHECK GLUCOSE ONCE DAILY .  tamsulosin (FLOMAX) 0.4 MG CAPS capsule, TAKE 1 CAPSULE BY MOUTH  DAILY .  vitamin E 400 UNIT capsule, Take 400 Units by mouth daily. Marland Kitchen  gabapentin (NEURONTIN) 100 MG capsule, Take 1 capsule (100 mg total) by mouth at bedtime.   Reviewed prior external information including notes and  imaging from  primary care provider As well as notes that were available from care everywhere and other healthcare systems.  Past medical history, social, surgical and family history all reviewed in electronic medical record.  [name] pertanent information unless stated regarding to the chief complaint.   Review of Systems:  [name] headache, visual changes, nausea, vomiting, diarrhea, constipation, dizziness, abdominal pain, skin rash, fevers, chills, night sweats, weight loss, swollen lymph nodes, joint swelling, chest pain, shortness of breath, mood changes. POSITIVE muscle aches, body aches  Objective  Blood pressure 112/88, pulse 81, height 5\' 11"  (1.803 m), weight 263 lb (119.3 kg), SpO2 99 %.   General: [name] apparent distress alert and oriented x3 mood and affect normal, dressed appropriately.  HEENT: Pupils equal, extraocular movements intact  Respiratory: Patient's speak in full sentences and does not appear short of breath  Cardiovascular: [name] lower extremity edema, non tender, [name] erythema  Low back exam does have significant loss of lordosis.  Decreased range of motion in all planes.  Significant tightness with Corky Sox right greater than left and does have tightness with mild radicular symptoms in the L5 distribution on the left side with straight leg test at 35 degrees.  Neurovascular intact distally and deep tendon reflexes intact.  Moderate to severe tenderness to palpation over the greater trochanteric area.   [id number]; 15 additional minutes spent for Therapeutic exercises as stated in above notes.  This included exercises focusing on stretching, strengthening, with significant focus on eccentric aspects.   Long term goals include an improvement in range of motion, strength, endurance as well as avoiding reinjury. Patient's frequency would include in 1-2 times a day, 3-5 times a week for a duration of 6-12 weeks. Low back exercises that included:  Pelvic tilt/bracing instruction to focus on control  of the pelvic girdle and lower abdominal muscles  Glute strengthening exercises, focusing on proper firing of the glutes without engaging the low back muscles Proper stretching techniques for maximum relief for the hamstrings, hip flexors, low back  and some rotation where tolerated    Proper technique shown and discussed handout in great detail with ATC.  All questions were discussed and answered.     Impression and Recommendations:     The above documentation has been reviewed and is accurate and complete Lyndal Pulley, DO

## 2020-08-09 ENCOUNTER — Ambulatory Visit (INDEPENDENT_AMBULATORY_CARE_PROVIDER_SITE_OTHER): Payer: Medicare Other

## 2020-08-09 ENCOUNTER — Ambulatory Visit: Payer: Medicare Other | Admitting: Family Medicine

## 2020-08-09 ENCOUNTER — Encounter: Payer: Self-pay | Admitting: Family Medicine

## 2020-08-09 ENCOUNTER — Other Ambulatory Visit: Payer: Self-pay

## 2020-08-09 VITALS — BP 112/88 | HR 81 | Ht 71.0 in | Wt 263.0 lb

## 2020-08-09 DIAGNOSIS — M5416 Radiculopathy, lumbar region: Secondary | ICD-10-CM | POA: Diagnosis not present

## 2020-08-09 DIAGNOSIS — G8929 Other chronic pain: Secondary | ICD-10-CM

## 2020-08-09 DIAGNOSIS — M545 Low back pain, unspecified: Secondary | ICD-10-CM

## 2020-08-09 DIAGNOSIS — M25551 Pain in right hip: Secondary | ICD-10-CM | POA: Diagnosis not present

## 2020-08-09 DIAGNOSIS — M47817 Spondylosis without myelopathy or radiculopathy, lumbosacral region: Secondary | ICD-10-CM | POA: Diagnosis not present

## 2020-08-09 DIAGNOSIS — M25552 Pain in left hip: Secondary | ICD-10-CM | POA: Diagnosis not present

## 2020-08-09 DIAGNOSIS — M4807 Spinal stenosis, lumbosacral region: Secondary | ICD-10-CM | POA: Diagnosis not present

## 2020-08-09 MED ORDER — GABAPENTIN 100 MG PO CAPS
100.0000 mg | ORAL_CAPSULE | Freq: Every day | ORAL | 0 refills | Status: DC
Start: 2020-08-09 — End: 2020-11-20

## 2020-08-09 NOTE — Assessment & Plan Note (Addendum)
I believe the patient is having more of a lumbar radiculopathy.  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  We discussed the potential for injections which patient declined.  Patient declined formal physical therapy as well.  Started on a low-dose of gabapentin to help with the nighttime pain.  Patient given a home exercises that work with athletic trainer otherwise at this time.  Follow-up again in 6 weeks if having worsening pain encouraged him to consider possibly greater trochanteric injection, possible advanced imaging, or formal physical therapy.  Patient is in agreement with the plan.

## 2020-08-09 NOTE — Patient Instructions (Signed)
Gabapentin 100mg  at night Xrays today Exercises for hip Ice 20 min 2x a day See me again in 4-5 weeks for injection

## 2020-08-21 ENCOUNTER — Ambulatory Visit: Payer: Medicare Other | Admitting: Internal Medicine

## 2020-08-21 ENCOUNTER — Other Ambulatory Visit: Payer: Self-pay

## 2020-08-21 ENCOUNTER — Encounter: Payer: Self-pay | Admitting: Internal Medicine

## 2020-08-21 VITALS — BP 128/80 | HR 72 | Ht 71.0 in | Wt 253.8 lb

## 2020-08-21 DIAGNOSIS — E1159 Type 2 diabetes mellitus with other circulatory complications: Secondary | ICD-10-CM | POA: Diagnosis not present

## 2020-08-21 DIAGNOSIS — E785 Hyperlipidemia, unspecified: Secondary | ICD-10-CM | POA: Diagnosis not present

## 2020-08-21 DIAGNOSIS — E1165 Type 2 diabetes mellitus with hyperglycemia: Secondary | ICD-10-CM | POA: Diagnosis not present

## 2020-08-21 DIAGNOSIS — E538 Deficiency of other specified B group vitamins: Secondary | ICD-10-CM | POA: Diagnosis not present

## 2020-08-21 DIAGNOSIS — G6289 Other specified polyneuropathies: Secondary | ICD-10-CM

## 2020-08-21 LAB — POCT GLYCOSYLATED HEMOGLOBIN (HGB A1C): Hemoglobin A1C: 7.9 % — AB (ref 4.0–5.6)

## 2020-08-21 MED ORDER — OZEMPIC (0.25 OR 0.5 MG/DOSE) 2 MG/1.5ML ~~LOC~~ SOPN: 0.5000 mg | PEN_INJECTOR | SUBCUTANEOUS | 3 refills | Status: DC

## 2020-08-21 NOTE — Patient Instructions (Signed)
Please continue: - Metformin 1000 mg 2x a day with meals - Glipizide 10 mg 2x a day before meals  Please start Ozempic 0.25 mg weekly in a.m. (for example on Sunday morning) x 4 weeks, then increase to 0.5 mg weekly in a.m. if no nausea or hypoglycemia.  Please return in 3-4 months with your sugar log.

## 2020-08-21 NOTE — Addendum Note (Signed)
Addended by: Lauralyn Primes on: 08/21/2020 08:40 AM   Modules accepted: Orders

## 2020-08-21 NOTE — Progress Notes (Signed)
Patient ID: Charles [name], male   DOB: [birth year], 79 y.o.   MRN: [id number]  This visit occurred during the SARS-CoV-2 public health emergency.  Safety protocols were in place, including screening questions prior to the visit, additional usage of staff PPE, and extensive cleaning of exam room while observing appropriate contact time as indicated for disinfecting solutions.   HPI: Charles [name] is a 79 y.o.-year-old male, returning for f/u for DM2 dx 2008, uncontrolled, with complications (CAD - s/p NSTEMI, PN, mild CKD). Last visit 3.5 months ago.  Sugars increased after starting Gabapentin for back pain 1 week ago.  DM2: Reviewed HbA1c levels: Lab Results  Component Value Date   HGBA1C 7.3 (A) 05/03/2020   HGBA1C 7.4 (A) 12/22/2019   HGBA1C 8.0 (A) 08/19/2019  12/2013: 8.6%  Pt is on a regimen of: - Metformin 1000 mg 2x a day >> 2000 mg with dinner (but sugars worse) >> 1000 mg 2x a day - Glipizide 10 mg 2x a day before meals  In the past, I recommended  NPH 14 units at bedtime, but he did not start. We tried Rybelsus 3 >> 6 mg daily-added 12/2018 >> $$$ >> stopped 01/2019 He checked with his insurance and none of the SGLT2 inhibitors are covered for him.  Pt checks his sugars once a day: - am: 112, 120-220, 233 >> 112-160s, few 200s (pizza) >> 114, 122-218 - 2h after b'fast: 68x1 >> n/c >> 201, 206 >> n/c - before lunch:  n/c >> 69, 79 >> n/c >> 90 x1 >> n/c - 2h after lunch: 123-176 >> n/c - before dinner  n/c >> 160s >> n/c - 2h after dinner: n/c >> 240 >> n/c - bedtime: 110-150 >> 160s >> n/c Lowest sugar was  147 >> 112 >> 90 >> 71 (on the diet); he has hypoglycemia awareness in the 80s. Highest sugar was 205 (missed medication) >> 220 >> 200s >> 218.  -+ Mild CKD, last BUN/creatinine:  Lab Results  Component Value Date   BUN 15 10/08/2019   CREATININE 1.02 10/08/2019  Of note, he has a history of nephrectomy. On lisinopril 10.  -+ HL; last set of lipids: Lab Results   Component Value Date   CHOL 127 10/08/2019   HDL 46.10 10/08/2019   LDLCALC 55 10/08/2019   LDLDIRECT 139.8 07/08/2014   TRIG 130.0 10/08/2019   CHOLHDL 3 10/08/2019  On Lipitor 40. - last eye exam was in 05/21: No DR; now seeing Dr. [place] - retinal vein occlusion, + cataracts, + macular "hole".  He has a history of eye surgeries.  He gets intraocular injections. -She is numbness and tingling in feet improved after starting B12  B12 deficiency: -Diagnosed during investigation for numbness and tingling (peripheral neuropathy) -He was initially on B12 injections, then on 5000 mcg daily -She is on 5000 mcg every 3 days, changed by PCP 09/2019.  He continues on this dose.  Reviewed his B12 levels: Lab Results  Component Value Date   VITAMINB12 748 12/22/2019   VITAMINB12 1,033 (H) 10/08/2019   VITAMINB12 1,172 (H) 08/19/2018   VITAMINB12 >1500 (H) 05/04/2018   VITAMINB12 1,249 (H) 02/18/2017   VITAMINB12 234 10/22/2016   VITAMINB12 217 04/19/2016   VITAMINB12 94 (L) 10/13/2015   He was admitted with NSTEMI 10/18/2015.  He had stents placed then.  He was going to the gym 5 out of 7 days, but stopped during the coronavirus pandemic.  He started Gabapentin for back pain radiating to  L leg.  On tamsulosin.  ROS: Constitutional: no weight gain/no weight loss, no fatigue, no subjective hyperthermia, no subjective hypothermia Eyes: no blurry vision, no xerophthalmia ENT: no sore throat, no nodules palpated in neck, no dysphagia, no odynophagia, no hoarseness Cardiovascular: no CP/no SOB/no palpitations/no leg swelling Respiratory: no cough/no SOB/no wheezing Gastrointestinal: no N/no V/no D/no C/no acid reflux Musculoskeletal: no muscle aches/no joint aches Skin: no rashes, no hair loss Neurological: no tremors/no numbness/no tingling/no dizziness, + pain radiating to leg  I reviewed pt's medications, allergies, PMH, social hx, family hx, and changes were documented in the  history of present illness. Otherwise, unchanged from my initial visit note.  Past Medical History:  Diagnosis Date  . Anemia    "when I was a lad"  . Arthritis    "right femur" (10/19/2015)  . AVM (arteriovenous malformation) of colon    2 - non-bleeding 2013  . Biceps muscle tear    right  . Cancer of kidney ([place]) 2000   left nephrectomy  . Coronary artery disease   . Eczema   . GERD (gastroesophageal reflux disease)   . Hernia, ventral   . Hyperlipidemia   . Hypertension   . Obesity   . Personal history of adenomatous colonic polyps 07/20/2012   3 + adenomas 2009 07/20/2012 2 diminutive polyps    . Pneumonia 1942; 1950s X 1  . Type II diabetes mellitus ([place])   . Walking pneumonia 2000's X 1   Past Surgical History:  Procedure Laterality Date  . CARDIAC CATHETERIZATION N/A 10/19/2015   Procedure: Left Heart Cath and Coronary Angiography;  Surgeon: Charolette Forward, MD;  Location: [facility];  Service: Cardiovascular;  Laterality: N/A;  . CARDIAC CATHETERIZATION N/A 10/19/2015   Procedure: Coronary Stent Intervention;  Surgeon: Charolette Forward, MD;  Location: [facility];  Service: Cardiovascular;  Laterality: N/A;  . CATARACT EXTRACTION, BILATERAL Bilateral 01/2018   Dr. Kathrin Penner did surgery and he developed MD and is seeing specialist now for MD treatment  . COLONOSCOPY     "nothing showed up this time"  . COLONOSCOPY W/ BIOPSIES AND POLYPECTOMY  X 1  . CORONARY ANGIOPLASTY    . FEMUR IM NAIL Right 07/26/2013   Procedure: INTRAMEDULLARY (IM) NAIL FEMORAL subtrochanteric;  Surgeon: Mauri Pole, MD;  Location: [facility];  Service: Orthopedics;  Laterality: Right;  . FRACTURE SURGERY    . INGUINAL HERNIA REPAIR Left >3 times  . INGUINAL HERNIA REPAIR Right 2012  . NEPHRECTOMY Left 2000  . TONSILLECTOMY  1940s   Social History   Socioeconomic History  . Marital status: Widowed    Spouse name: Not on file  . Number of children: 3  . Years of education: Not on  file  . Highest education level: Not on file  Occupational History  . Occupation: retired  Tobacco Use  . Smoking status: Former Smoker    Years: 20.00    Types: Cigarettes, Pipe, Cigars    Quit date: 09/09/1968    Years since quitting: 51.9  . Smokeless tobacco: Never Used  . Tobacco comment: smoking cigars- quit in 13   Substance and Sexual Activity  . Alcohol use: Yes    Alcohol/week: 1.0 standard drink    Types: 1 Cans of beer per week    Comment: occasionally   . Drug use: No  . Sexual activity: Never  Other Topics Concern  . 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     widowed    Three children, One in [place], one in [place] one in [place] - 2 : youngest daughter lives with him   Social Determinants of Health   Financial Resource Strain: Low Risk   . Difficulty of Paying Living Expenses: Not hard at all  Food Insecurity: Not on file  Transportation Needs: No Transportation Needs  . Lack of Transportation (Medical): No  . Lack of Transportation (Non-Medical): No  Physical Activity: Sufficiently Active  . Days of Exercise per Week: 5 days  . Minutes of Exercise per Session: 50 min  Stress: Stress Concern Present  . Feeling of Stress : To some extent  Social Connections: Moderately Integrated  . Frequency of Communication with Friends and Family: More than three times a week  . Frequency of Social Gatherings with Friends and Family: More than three times a week  . Attends Religious Services: More than 4 times per year  . Active Member of Clubs or Organizations: Yes  . Attends Archivist Meetings: More than 4 times per year  . Marital Status: Widowed  Intimate Partner Violence: Not on file   Current Outpatient Medications on File Prior to Visit  Medication Sig Dispense Refill  . ASTAXANTHIN PO Take 1 tablet by mouth  daily.    Marland Kitchen atorvastatin (LIPITOR) 40 MG tablet Take 1 tablet (40 mg total) by mouth daily at 6 PM. 30 tablet 3  . Bioflavonoid Products (VITAMIN C) CHEW Chew 1 tablet by mouth daily.     . Cyanocobalamin 5000 MCG TBDP Take 5,000 mcg by mouth every 3 (three) days.     . cyclobenzaprine (FLEXERIL) 10 MG tablet Take 1 tablet (10 mg total) by mouth at bedtime. 10 tablet 0  . diclofenac sodium (VOLTAREN) 1 % GEL Apply 4 g topically 4 (four) times daily. 150 g 1  . EPINEPHrine (EPIPEN 2-PAK) 0.3 mg/0.3 mL DEVI Inject 0.3 mg into the muscle daily as needed (allergic reaction).     . gabapentin (NEURONTIN) 100 MG capsule Take 1 capsule (100 mg total) by mouth at bedtime. 180 capsule 0  . glipiZIDE (GLUCOTROL) 5 MG tablet TAKE 1 TABLET BY MOUTH 2  TIMES DAILY BEFORE A MEAL 180 tablet 3  . insulin NPH Human (NOVOLIN N RELION) 100 UNIT/ML injection Inject 14 units at bedtime under skin 10 mL 11  . Insulin Syringes, Disposable, U-100 0.5 ML MISC Use 1x a day 100 each 3  . lisinopril (PRINIVIL,ZESTRIL) 5 MG tablet Take 1 tablet (5 mg total) by mouth daily. 30 tablet 3  . lisinopril (ZESTRIL) 20 MG tablet Take 20 mg by mouth daily.    . metFORMIN (GLUCOPHAGE) 1000 MG tablet TAKE 1 TABLET BY MOUTH  TWICE DAILY WITH MEALS 180 tablet 3  . methylPREDNISolone (MEDROL DOSEPAK) 4 MG TBPK tablet Take as directed 21 tablet 0  . metoprolol succinate (TOPROL-XL) 100 MG 24 hr tablet Take 100 mg by mouth daily.     . Multiple Vitamins-Minerals (ICAPS PO) Take 1 tablet by mouth daily.    . Multiple Vitamins-Minerals (PRESERVISION AREDS 2 PO) Take 1 tablet by mouth daily.    Marland Kitchen neomycin-polymyxin-dexameth (MAXITROL) 0.1 % OINT Place 1 application into the left eye 2 (two) times daily.    . nitroGLYCERIN (NITROSTAT) 0.4 MG SL tablet Place 1 tablet (0.4 mg total) under the tongue every  5 (five) minutes as needed for chest pain. 25 tablet 1  . ONETOUCH DELICA LANCETS FINE MISC Use to check sugar daily 100 each 5  . ONETOUCH  VERIO test strip USE 1 STRIP TO CHECK GLUCOSE ONCE DAILY 50 each 11  . sodium chloride (OCEAN) 0.65 % SOLN nasal spray Place 2 sprays into both nostrils 3 (three) times daily. 1 Bottle 0  . tamsulosin (FLOMAX) 0.4 MG CAPS capsule TAKE 1 CAPSULE BY MOUTH  DAILY 90 capsule 2  . vitamin E 400 UNIT capsule Take 400 Units by mouth daily.     No current facility-administered medications on file prior to visit.   No Known Allergies Family History  Problem Relation Age of Onset  . Drug abuse Other   . Cancer Other   . Heart disease Other   . Lung disease Other   . Diabetes Mother   . Diabetes Sister   . Colon cancer Neg Hx   . Stomach cancer Neg Hx     PE: BP 128/80   Pulse 72   Ht 5\' 11"  (1.803 m)   Wt 253 lb 12.8 oz (115.1 kg)   SpO2 98%   BMI 35.40 kg/m  Wt Readings from Last 3 Encounters:  08/21/20 253 lb 12.8 oz (115.1 kg)  08/09/20 263 lb (119.3 kg)  05/03/20 255 lb (115.7 kg)   Constitutional: overweight, in NAD Eyes: PERRLA, EOMI, no exophthalmos ENT: moist mucous membranes, no thyromegaly, no cervical lymphadenopathy Cardiovascular: RRR, No MRG, +R>L LE swelling Respiratory: CTA B Gastrointestinal: abdomen soft, NT, ND, BS+ Musculoskeletal: no deformities, strength intact in all 4 Skin: moist, warm, no rashes Neurological: no tremor with outstretched hands, DTR normal in all 4  ASSESSMENT: 1. DM2, insulin-dependent but not on insulin, uncontrolled, with complications - CAD - s/p NSTEMI (Dr. Terrence Dupont) - peripheral neuropathy - CKD  2. B12 def  3.  Peripheral neuropathy  4. HL  PLAN:  1. Patient with longstanding, uncontrolled, type 2 diabetes, with improved control at last visit (HbA1c 7.3%).  He is on Metformin and sulfonylurea and unfortunately he cannot afford higher tier medications.  I did recommend insulin in the past but he did not want to start this.  Sugars were better in the morning at last visit, with only few values in the 200s, usually after a large  dinner, for example pizza.  Also, he was forgetting his nighttime medications occasionally and we discussed that it is important to put them on the table so he can take them at every meal.  He was also not checking sugars later in the day and I advised him to try to do so.  We did not change the regimen at that time. -He tells me that after our last he started on a diet: Reduce snacking between meals.  His sugars improved significantly, and even had a blood sugar in the 70s, after which she decrease the glipizide dose to half.  However, he relaxed his diet now around the holidays and also started gabapentin approximately 1 week ago when sugars increased.  They are up to 200s in the morning.  Therefore, at today's visit, we need to escalate his regimen.  I will try it again to prescribe a GLP-1 receptor agonist for him, especially in the setting of his cardiovascular disease.  If this is not affordable, we can try to get help from [facility], of which he is a member. -We discussed about benefits and possible side effects.  We will start  at a low dose and increase as tolerated.  I demonstrated pen use. - I advised him to: Patient Instructions  Please continue: - Metformin 1000 mg 2x a day with meals - Glipizide 10 mg 2x a day before meals  Please start Ozempic 0.25 mg weekly in a.m. (for example on Sunday morning) x 4 weeks, then increase to 0.5 mg weekly in a.m. if no nausea or hypoglycemia.  Please return in 3-4 months with your sugar log.  - we checked his HbA1c: 7.9% (higher) - advised to check sugars at different times of the day - 1x a day, rotating check times - advised for yearly eye exams >> he is UTD - return to clinic in 4 months      2.  And 3. Peripheral neuropathy and B12 deficiency -His peripheral neuropathy is probably related to diabetes and B12 deficiency -We initially started him on intramuscular injections of B12, then switched to 5000 mcg B12 daily -As his B12 level was high on  this dose, PCP suggested to move the dose every 3 days.  We discussed in the past that we could continue with this more switch to 1000 mcg every day, but he still has a lot of 5000 mcg chewable tablets at home and continues with these. -At last check, B12 level 12/2019  4. HL -Reviewed latest lipid panel from 09/2019: LDL much improved, at goal, as were the rest of his lipid fractions: Lab Results  Component Value Date   CHOL 127 10/08/2019   HDL 46.10 10/08/2019   LDLCALC 55 10/08/2019   LDLDIRECT 139.8 07/08/2014   TRIG 130.0 10/08/2019   CHOLHDL 3 10/08/2019  -Continues Lipitor 40 without side effects   Philemon Kingdom, MD PhD [facility] Endocrinology

## 2020-08-22 ENCOUNTER — Other Ambulatory Visit: Payer: Self-pay | Admitting: Internal Medicine

## 2020-08-22 IMAGING — DX DG ABDOMEN 1V
2 series · 2 of 2 positions shown · non-contrast
Comparison: None.

CLINICAL DATA: Left upper quadrant pain

EXAM:
ABDOMEN - 1 VIEW

[abdomen supine ap (1 of 2)]
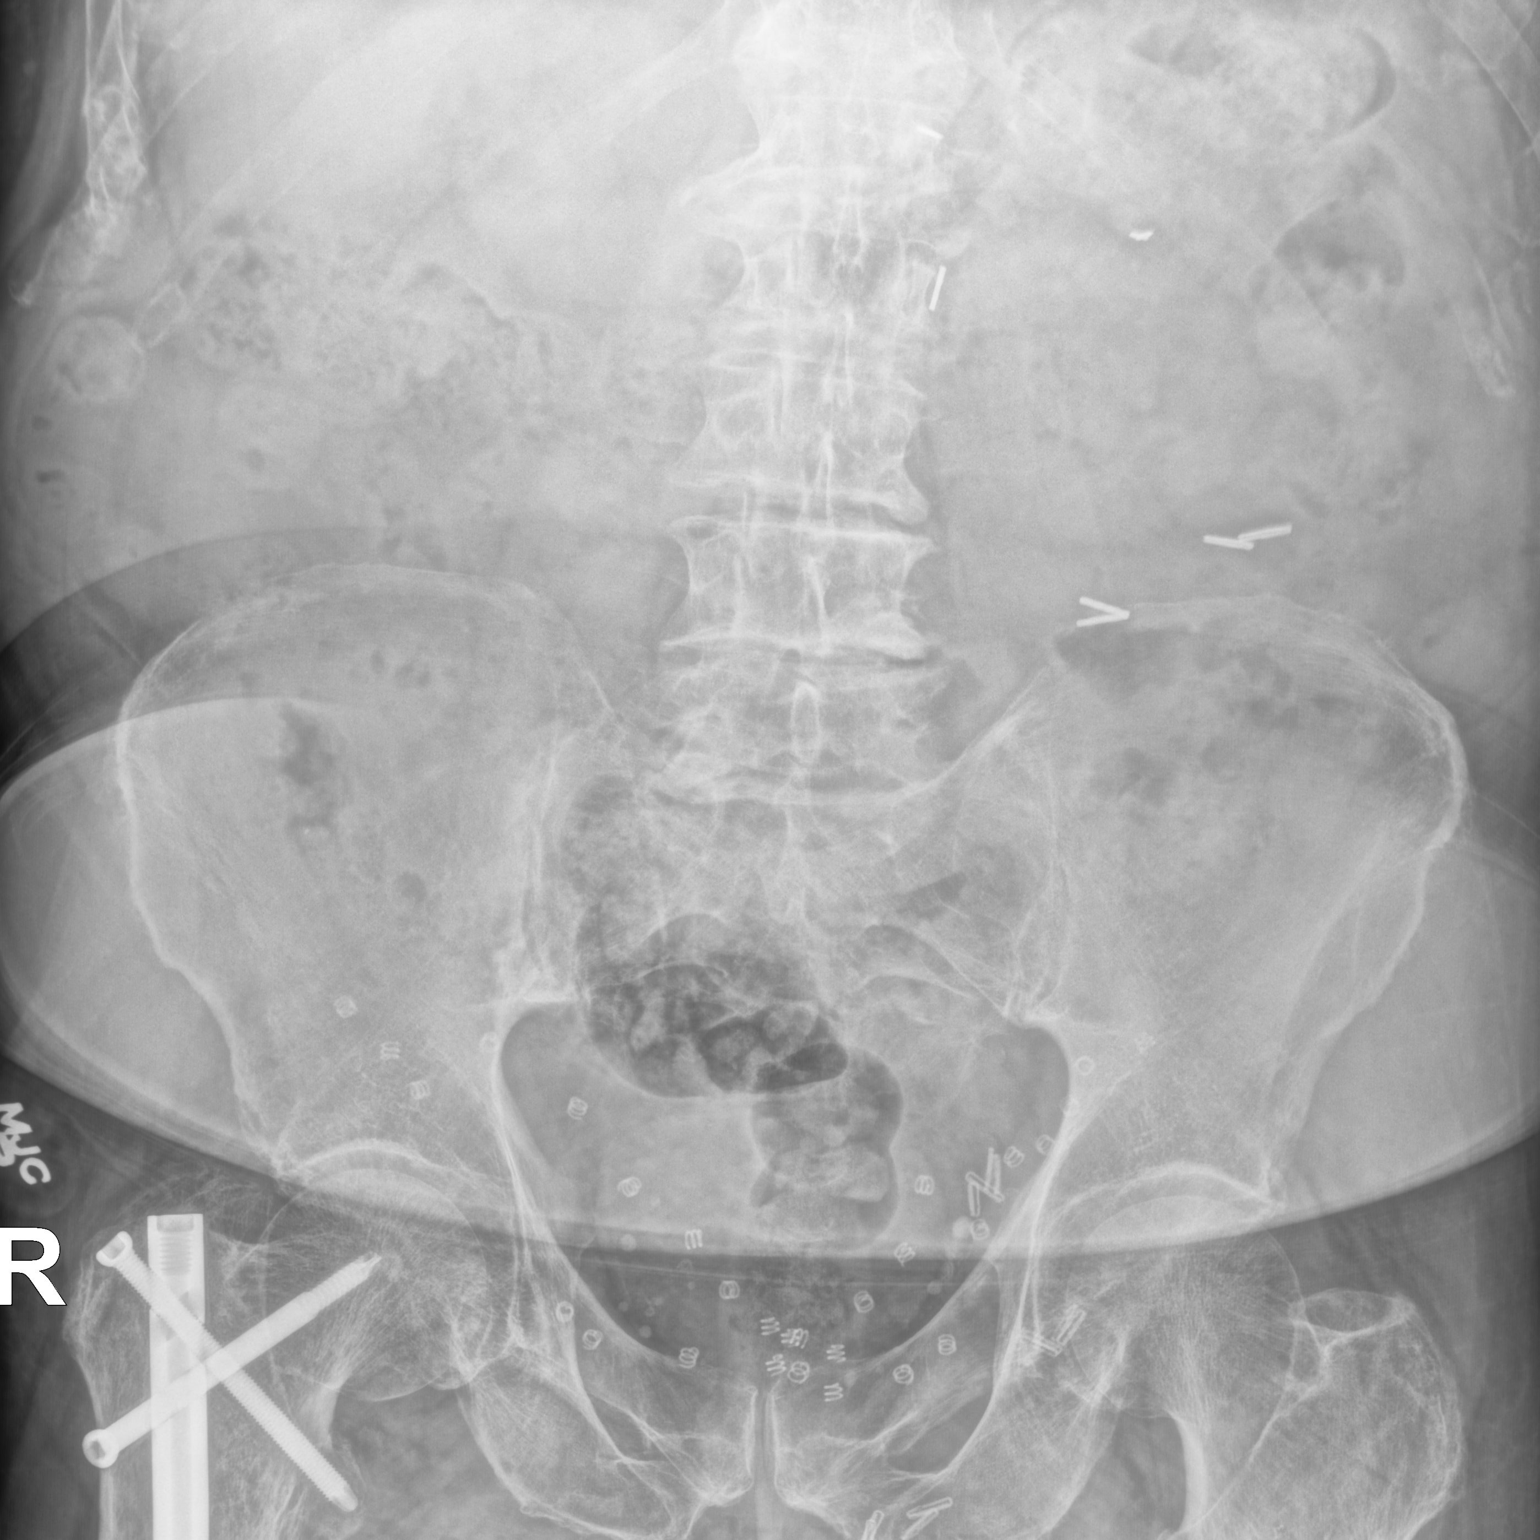

[abdomen supine ap (2 of 2)]
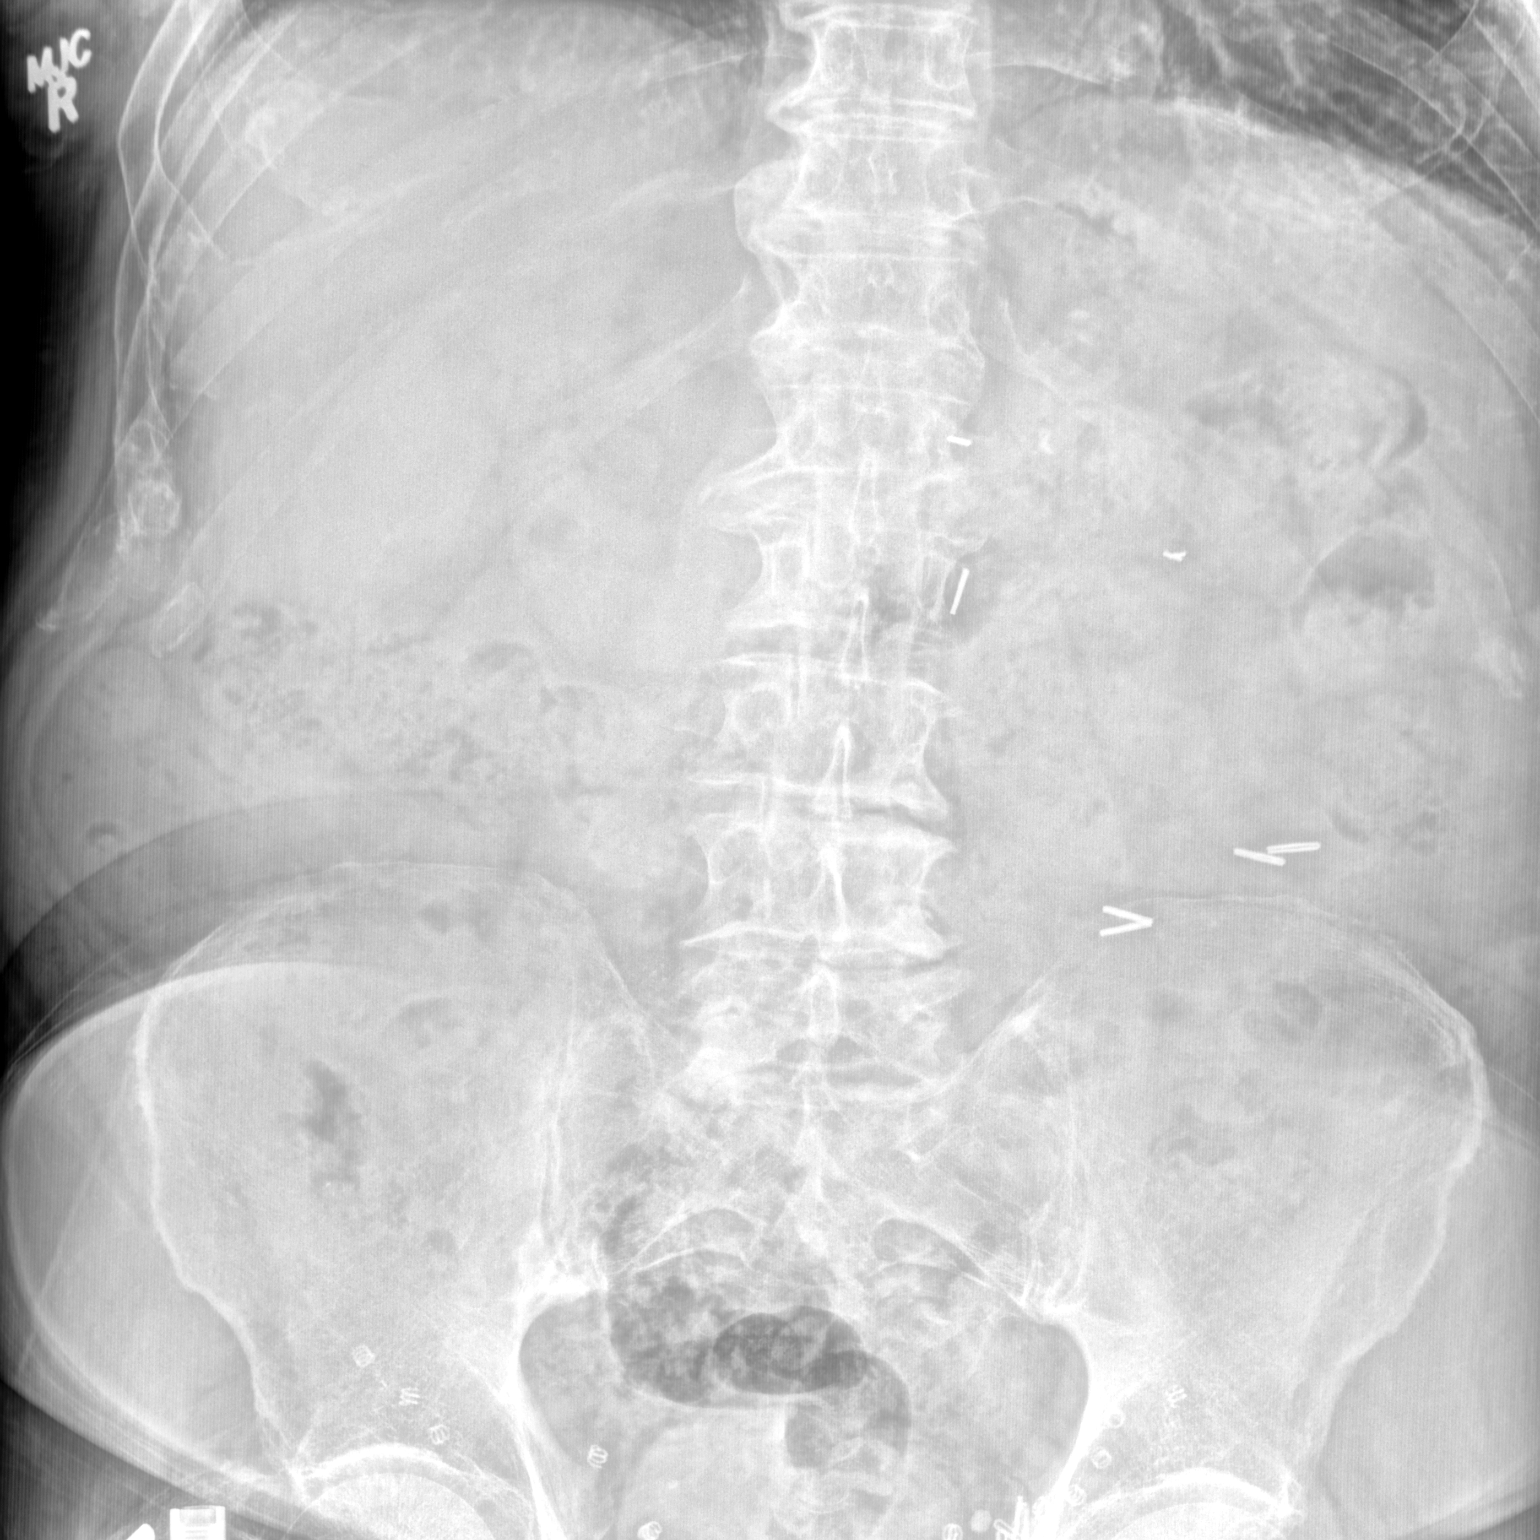

[2 of 2 positions shown; findings below may reference images not displayed]

FINDINGS: Moderate stool burden throughout the colon. No evidence of
obstruction or free air. No organomegaly or suspicious
calcification.
IMPRESSION: Moderate to large stool burden throughout the colon. No evidence of
bowel obstruction.

## 2020-08-25 ENCOUNTER — Other Ambulatory Visit: Payer: Self-pay | Admitting: *Deleted

## 2020-08-25 ENCOUNTER — Telehealth: Payer: Self-pay | Admitting: Pharmacist

## 2020-08-25 ENCOUNTER — Encounter: Payer: Self-pay | Admitting: *Deleted

## 2020-08-25 NOTE — Telephone Encounter (Signed)
Called the patient in response to [facility] outreach from [name], Therapist, sports. Patient was going to pick up first prescription of Ozempic and it was too expensive for him and the pharmacist informed him that it would put him in the donut hole.   Discussed options for patient assistance and based on current income, he would not qualify for any GLP1 patient assistance or likely any of the programs for other DM medications.  Based on insurance investigation, Rybelsus, Ozempic and Trulicity will all put the patient in the donut hole by April 2022. SGLT2 and DDP-4i will put him in the coverage gap in July 2022. Will send a message to endocrinologist about options that may put him in the donut hole later. Plan to follow up in Jan 2022.

## 2020-08-25 NOTE — Patient Outreach (Signed)
[name]) Care Management [facility] CM Telephone Outreach, routine MD referral from 08/22/20 Charles [name]   08/25/2020  Charles [name] 1941-01-14 [id number]  Successful initial telephone outreach attempt to Charles [name], 79 y/o male referred to [facility] CM 08/22/20 on routine MD (endocrinology) referral for medication assistance with diabetes medications and self-health management of DM.  Patient has had no recent hospitalizations.  Patient has history including, but not limited to, DM-II with neuropathy (most recent A1-C 7.9, 08/21/20); HTN/ HLD; renal cell carcinoma with previous nephrectomy; previous NSTEMI; obesity.  HIPAA/ identity verified with patient; purpose of call/ [facility] CM services discussed with patient, who provides verbal consent for [facility] CM involvement in his care.  Patient reports doing well; denies clinical concerns; reports independent in all self- care activities.  Screening completed for SDOH, no concerns identified.  Reports continues to drive self; attends [employer] for exercise with friends daily.  Confirms he is in donut hole and unable to afford prescribed medications for DM (Ozempic/ trulicity); confirms that he is regularly followed by [facility] pharmacist at PCP office.  Monitors and records fasting blood sugars QD; reports general (recent) ranges between 160-190; reports dietary indiscretions as his biggest challenge; reports in the past he has been very focused on his diet and his blood sugars at that time were consistently "better," generally "between 110-130."  Occasionally has symptoms hypoglycemia, denies recently; reports he develops symptoms when his blood sugar goes below 100; reports lowest isolated recent blood sugar several weeks ago of "85;" this was corrected by eating peanut butter crackers.  Mentions that he is scheduled to have cortisone injection in knee in early January for knee pain/ discomfort; states he is concerned that  this injection may increase his blood sugars- encouraged him to discuss treatment options with care provider.  Patient denies further issues, concerns, or problems today.  I provided/ confirmed that patient has my direct phone number, the main [facility] CM office phone number, and the [facility] CM 24-hour nurse advice phone number should issues arise prior to next scheduled [facility] CM outreach after upcoming appointments with [facility] pharmacist and Sports medicine provider at PCP office.  Encouraged patient to contact me directly if needs, questions, issues, or concerns arise prior to next scheduled outreach; patient agreed to do so.  [facility] CM Telephone Outreach Care [name] Contacted Charles [name], [facility] Pharmacist at PCP office to notify of referral for patient assistance post-endocrinology referral 08/22/20; Charles [name] appreciative of call and will outreach patient to facilitate/ assist   Plan:  Patient will take medications as prescribed and will attend all scheduled provider appointments  Patient will promptly notify care providers for any new concerns/ issues/ problems that arise  Patient will continue monitoring fasting daily blood sugars  I will make patient's PCP aware of [name] RN CM involvement in patient's care-- will send barriers letter  [facility] CM outreach to continue with scheduled phone call in 3 weeks, unless indicated sooner  Oneta Rack, RN, BSN, [place] Care Management  [phone]

## 2020-08-25 NOTE — Patient Instructions (Signed)
Goals Addressed            This Visit's Progress    [facility] CM: Maintain My Quality of Life       Timeframe:  Long-Range Goal Priority:  Medium Start Date:   08/25/20                          Expected End Date:       11/23/20                Follow Up Date by 09/22/20   - discuss my treatment options with the doctor or nurse - do one enjoyable thing every day - make shared treatment decisions with doctor - spend time outdoors at least 3 times a week    Why is this important?    Having a long-term illness can be scary.   It can also be stressful for you and your caregiver.   These steps may help.    Notes:      [facility] CM: Make and Keep All Appointments       Timeframe:  Short-Term Goal Priority:  Medium Start Date:      08/25/20                       Expected End Date:        09/25/19               Follow Up Date by 09/22/20   - call to cancel if needed - keep a calendar with appointment dates    Why is this important?    Part of staying healthy is seeing the doctor for follow-up care.   If you forget your appointments, there are some things you can do to stay on track.    Notes:   08/25/20: -- Doristine Devoid job attending all of your medical/ doctor appointments: keep up the great work! -- Contact your care providers if you experience lack of transportation that would cause you to miss your scheduled appointments: they may be able to provide resources for transportation     [facility] CM: Manage My Medicine       Timeframe:  Long-Range Goal Priority:  Medium Start Date:      08/25/20                       Expected End Date:       11/23/20               Follow Up Date by 09/22/20   - call for medicine refill 2 or 3 days before it runs out - call if I am sick and can't take my medicine - keep a list of all the medicines I take; vitamins and herbals too - use a pillbox to sort medicine - use an alarm clock or phone to remind me to take my medicine    Why is this important?     These steps will help you keep on track with your medicines.   Notes:   08/25/20: -- I have reached out the the Pharmacist at your PCP office to facilitate your request for patient assistance with affording your newly prescribed medications for DM: please stay in touch with the pharmacist at your PCP office     [facility] CM: Monitor and Manage My Blood Sugar-Diabetes Type 2       Timeframe:  Long-Range Goal Priority:  Medium Start  Date:    08/25/20                         Expected End Date:          11/24/19             Follow Up Date by 09/22/20    - check blood sugar at prescribed times - check blood sugar before and after exercise - check blood sugar if I feel it is too high or too low - enter blood sugar readings and medication or insulin into daily log - take the blood sugar log to all doctor visits - take the blood sugar meter to all doctor visits    Why is this important?    Checking your blood sugar at home helps to keep it from getting very high or very low.   Writing the results in a diary or log helps the doctor know how to care for you.   Your blood sugar log should have the time, date and the results.   Also, write down the amount of insulin or other medicine that you take.   Other information, like what you ate, exercise done and how you were feeling, will also be helpful.     Notes:   08/25/20: -- Good job consistently monitoring your fasting blood sugars every day- keep up the good work -- we will review all of your recent blood sugars at home when we talk next time by phone; please be listening out for my next phone call -- please read and review the attached information around dietary strategies for blood sugar control: we will talk about this with future phone calls -- I have placed the "Living Well With Diabetes" booklet in the mail to you- please be looking for it, and review     [facility] CM: Set My Target A1C-Diabetes Type 2       Timeframe:  Long-Range  Goal Priority:  Medium Start Date:     08/25/20                        Expected End Date:    11/23/20                   Follow Up Date by 09/22/20   - set target A1C    Why is this important?    Your target A1C is decided together by you and your doctor.   It is based on several things like your age and other health issues.    Notes:   08/25/20: -- we reviewed your recent A1-C results today; please read over the attached information and let me know if you have any questions       Hemoglobin A1c Test Why am I having this test? You may have the hemoglobin A1c test (HbA1c test) done to:  Evaluate your risk for developing diabetes (diabetes mellitus).  Diagnose diabetes.  Monitor long-term control of blood sugar (glucose) in people who have diabetes and help make treatment decisions. This test may be done with other blood glucose tests, such as fasting blood glucose and oral glucose tolerance tests. What is being tested? Hemoglobin is a type of protein in the blood that carries oxygen. Glucose attaches to hemoglobin to form glycated hemoglobin. This test checks the amount of glycated hemoglobin in your blood, which is a good indicator of the average amount of glucose in your blood during the  past 2-3 months. What kind of sample is taken?  A blood sample is required for this test. It is usually collected by inserting a needle into a blood vessel. Tell a health care provider about:  All medicines you are taking, including vitamins, herbs, eye drops, creams, and over-the-counter medicines.  Any blood disorders you have.  Any surgeries you have had.  Any medical conditions you have.  Whether you are pregnant or may be pregnant. How are the results reported? Your results will be reported as a percentage that indicates how much of your hemoglobin has glucose attached to it (is glycated). Your health care provider will compare your results to normal ranges that were established  after testing a large group of people (reference ranges). Reference ranges may vary among labs and hospitals. For this test, common reference ranges are:  Adult or child without diabetes: 4-5.6%.  Adult or child with diabetes and good blood glucose control: less than 7%. What do the results mean? If you have diabetes:  A result of less than 7% is considered normal, meaning that your blood glucose is well controlled.  A result higher than 7% means that your blood glucose is not well controlled, and your treatment plan may need to be adjusted. If you do not have diabetes:  A result within the reference range is considered normal, meaning that you are not at high risk for diabetes.  A result of 5.7-6.4% means that you have a high risk of developing diabetes, and you may have prediabetes. Prediabetes is the condition of having a blood glucose level that is higher than it should be, but not high enough for you to be diagnosed with diabetes. Having prediabetes puts you at risk for developing type 2 diabetes (type 2 diabetes mellitus). You may have more tests, including a repeat HbA1c test.  Results of 6.5% or higher on two separate HbA1c tests mean that you have diabetes. You may have more tests to confirm the diagnosis. Abnormally low HbA1c values may be caused by:  Pregnancy.  Severe blood loss.  Receiving donated blood (transfusions).  Low red blood cell count (anemia).  Long-term kidney failure.  Some unusual forms (variants) of hemoglobin. Talk with your health care provider about what your results mean. Questions to ask your health care provider Ask your health care provider, or the department that is doing the test:  When will my results be ready?  How will I get my results?  What are my treatment options?  What other tests do I need?  What are my next steps? Summary  The hemoglobin A1c test (HbA1c test) may be done to evaluate your risk for developing diabetes, to  diagnose diabetes, and to monitor long-term control of blood sugar (glucose) in people who have diabetes and help make treatment decisions.  Hemoglobin is a type of protein in the blood that carries oxygen. Glucose attaches to hemoglobin to form glycated hemoglobin. This test checks the amount of glycated hemoglobin in your blood, which is a good indicator of the average amount of glucose in your blood during the past 2-3 months.  Talk with your health care provider about what your results mean. This information is not intended to replace advice given to you by your health care provider. Make sure you discuss any questions you have with your health care provider. Document Revised: 08/08/2017 Document Reviewed: 04/08/2017 Elsevier Patient Education  [place].   Diabetes Mellitus and Nutrition, Adult When you have diabetes (diabetes mellitus),  it is very important to have healthy eating habits because your blood sugar (glucose) levels are greatly affected by what you eat and drink. Eating healthy foods in the appropriate amounts, at about the same times every day, can help you:  Control your blood glucose.  Lower your risk of heart disease.  Improve your blood pressure.  Reach or maintain a healthy weight. Every person with diabetes is different, and each person has different needs for a meal plan. Your health care provider may recommend that you work with a diet and nutrition specialist (dietitian) to make a meal plan that is best for you. Your meal plan may vary depending on factors such as:  The calories you need.  The medicines you take.  Your weight.  Your blood glucose, blood pressure, and cholesterol levels.  Your activity level.  Other health conditions you have, such as heart or kidney disease. How do carbohydrates affect me? Carbohydrates, also called carbs, affect your blood glucose level more than any other type of food. Eating carbs naturally raises the amount  of glucose in your blood. Carb counting is a method for keeping track of how many carbs you eat. Counting carbs is important to keep your blood glucose at a healthy level, especially if you use insulin or take certain oral diabetes medicines. It is important to know how many carbs you can safely have in each meal. This is different for every person. Your dietitian can help you calculate how many carbs you should have at each meal and for each snack. Foods that contain carbs include:  Bread, cereal, rice, pasta, and crackers.  Potatoes and corn.  Peas, beans, and lentils.  Milk and yogurt.  Fruit and juice.  Desserts, such as cakes, cookies, ice cream, and candy. How does alcohol affect me? Alcohol can cause a sudden decrease in blood glucose (hypoglycemia), especially if you use insulin or take certain oral diabetes medicines. Hypoglycemia can be a life-threatening condition. Symptoms of hypoglycemia (sleepiness, dizziness, and confusion) are similar to symptoms of having too much alcohol. If your health care provider says that alcohol is safe for you, follow these guidelines:  Limit alcohol intake to no more than 1 drink per day for nonpregnant women and 2 drinks per day for men. One drink equals 12 oz of beer, 5 oz of wine, or 1 oz of hard liquor.  Do not drink on an empty stomach.  Keep yourself hydrated with water, diet soda, or unsweetened iced tea.  Keep in mind that regular soda, juice, and other mixers may contain a lot of sugar and must be counted as carbs. What are tips for following this plan?  Reading food labels  Start by checking the serving size on the "Nutrition Facts" label of packaged foods and drinks. The amount of calories, carbs, fats, and other nutrients listed on the label is based on one serving of the item. Many items contain more than one serving per package.  Check the total grams (g) of carbs in one serving. You can calculate the number of servings of  carbs in one serving by dividing the total carbs by 15. For example, if a food has 30 g of total carbs, it would be equal to 2 servings of carbs.  Check the number of grams (g) of saturated and trans fats in one serving. Choose foods that have low or no amount of these fats.  Check the number of milligrams (mg) of salt (sodium) in one serving. Most people  should limit total sodium intake to less than 2,300 mg per day.  Always check the nutrition information of foods labeled as "low-fat" or "nonfat". These foods may be higher in added sugar or refined carbs and should be avoided.  Talk to your dietitian to identify your daily goals for nutrients listed on the label. Shopping  Avoid buying canned, premade, or processed foods. These foods tend to be high in fat, sodium, and added sugar.  Shop around the outside edge of the grocery store. This includes fresh fruits and vegetables, bulk grains, fresh meats, and fresh dairy. Cooking  Use low-heat cooking methods, such as baking, instead of high-heat cooking methods like deep frying.  Cook using healthy oils, such as olive, canola, or sunflower oil.  Avoid cooking with butter, cream, or high-fat meats. Meal planning  Eat meals and snacks regularly, preferably at the same times every day. Avoid going long periods of time without eating.  Eat foods high in fiber, such as fresh fruits, vegetables, beans, and whole grains. Talk to your dietitian about how many servings of carbs you can eat at each meal.  Eat 4-6 ounces (oz) of lean protein each day, such as lean meat, chicken, fish, eggs, or tofu. One oz of lean protein is equal to: ? 1 oz of meat, chicken, or fish. ? 1 egg. ?  cup of tofu.  Eat some foods each day that contain healthy fats, such as avocado, nuts, seeds, and fish. Lifestyle  Check your blood glucose regularly.  Exercise regularly as told by your health care provider. This may include: ? 150 minutes of moderate-intensity  or vigorous-intensity exercise each week. This could be brisk walking, biking, or water aerobics. ? Stretching and doing strength exercises, such as yoga or weightlifting, at least 2 times a week.  Take medicines as told by your health care provider.  Do not use any products that contain nicotine or tobacco, such as cigarettes and e-cigarettes. If you need help quitting, ask your health care provider.  Work with a Social worker or diabetes educator to identify strategies to manage stress and any emotional and social challenges. Questions to ask a health care provider  Do I need to meet with a diabetes educator?  Do I need to meet with a dietitian?  What number can I call if I have questions?  When are the best times to check my blood glucose? Where to find more information:  American Diabetes Association: diabetes.org  Academy of Nutrition and Dietetics: www.eatright.[employer] of Diabetes and Digestive and Kidney Diseases (NIH): [employer] Summary  A healthy meal plan will help you control your blood glucose and maintain a healthy lifestyle.  Working with a diet and nutrition specialist (dietitian) can help you make a meal plan that is best for you.  Keep in mind that carbohydrates (carbs) and alcohol have immediate effects on your blood glucose levels. It is important to count carbs and to use alcohol carefully. This information is not intended to replace advice given to you by your health care provider. Make sure you discuss any questions you have with your health care provider. Document Revised: 08/08/2017 Document Reviewed: 09/30/2016 Elsevier Patient Education  2020 [employer].

## 2020-08-28 NOTE — Progress Notes (Signed)
This encounter was created in error - please disregard.

## 2020-08-28 NOTE — Patient Outreach (Signed)
[facility]) Care Management  08/28/2020  Charles [name] 1941-08-27 [id number]  Referral for medication assistance from [name], RN sent to [employer].  [name] [facility] Management Assistant [phone]

## 2020-09-05 ENCOUNTER — Other Ambulatory Visit: Payer: Self-pay | Admitting: Adult Health

## 2020-09-05 DIAGNOSIS — N401 Enlarged prostate with lower urinary tract symptoms: Secondary | ICD-10-CM

## 2020-09-12 NOTE — Progress Notes (Signed)
[facility] [place] Phone: [phone] Subjective:   Charles [name], am serving as a scribe for Dr. Hulan Saas. This visit occurred during the SARS-CoV-2 public health emergency.  Safety protocols were in place, including screening questions prior to the visit, additional usage of staff PPE, and extensive cleaning of exam room while observing appropriate contact time as indicated for disinfecting solutions.   I'm seeing this patient by the request  of:  [name], NP  CC: Low back and leg pain    [id number]   08/09/2020 I believe the patient is having more of a lumbar radiculopathy.  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  We discussed the potential for injections which patient declined.  Patient declined formal physical therapy as well.  Started on a low-dose of gabapentin to help with the nighttime pain.  Patient given a home exercises that work with athletic trainer otherwise at this time.  Follow-up again in 6 weeks if having worsening pain encouraged him to consider possibly greater trochanteric injection, possible advanced imaging, or formal physical therapy.  Patient is in agreement with the plan.  Update 09/13/2020 Charles [name] is a 80 y.o. male coming in with complaint of lumbar radiculopathy.  Patient was having signs and symptoms consistent with more of a lumbar radiculopathy.  Patient also has signs and symptoms of more of a greater trochanteric bursitis of the left hip.  Patient's past medical history is significant for diabetes and last A1c is 7.9.  Regarding his back patient states that his back pain is better due to taking gabapentin. Pain increases with stair climbing in left leg. Pain with lying on left side.    At last exam patient did have x-rays taken of the lumbar spine.  X-rays showed the patient does have multilevel  degenerative disc disease most significant at L4-S1 with facet hypertrophic changes well        Past Medical History:  Diagnosis Date  . Anemia    "when I was a lad"  . Arthritis    "right femur" (10/19/2015)  . AVM (arteriovenous malformation) of colon    2 - non-bleeding 2013  . Biceps muscle tear    right  . Cancer of kidney ([place]) 2000   left nephrectomy  . Coronary artery disease   . Eczema   . GERD (gastroesophageal reflux disease)   . Hernia, ventral   . Hyperlipidemia   . Hypertension   . Obesity   . Personal history of adenomatous colonic polyps 07/20/2012   3 + adenomas 2009 07/20/2012 2 diminutive polyps    . Pneumonia 1942; 1950s X 1  . Type II diabetes mellitus ([place])   . Walking pneumonia 2000's X 1   Past Surgical History:  Procedure Laterality Date  . CARDIAC CATHETERIZATION N/A 10/19/2015   Procedure: Left Heart Cath and Coronary Angiography;  Surgeon: Charolette Forward, MD;  Location: [facility];  Service: Cardiovascular;  Laterality: N/A;  . CARDIAC CATHETERIZATION N/A 10/19/2015   Procedure: Coronary Stent Intervention;  Surgeon: Charolette Forward, MD;  Location: [facility];  Service: Cardiovascular;  Laterality: N/A;  . CATARACT EXTRACTION, BILATERAL Bilateral 01/2018   Dr. Kathrin Penner did surgery and he developed MD and is seeing specialist now for MD treatment  . COLONOSCOPY     "nothing showed up this time"  . COLONOSCOPY W/ BIOPSIES AND POLYPECTOMY  X 1  . CORONARY ANGIOPLASTY    . FEMUR IM NAIL Right 07/26/2013   Procedure: INTRAMEDULLARY (IM) NAIL FEMORAL subtrochanteric;  Surgeon: Shelda Pal, MD;  Location: [facility];  Service: Orthopedics;  Laterality: Right;  . FRACTURE SURGERY    . INGUINAL HERNIA REPAIR Left >3 times  . INGUINAL HERNIA REPAIR Right 2012  . NEPHRECTOMY Left 2000  . TONSILLECTOMY  1940s   Social History   Socioeconomic History  . Marital status: Widowed    Spouse name: Not on file  . Number of children: 3  . Years  of education: Not on file  . Highest education level: Not on file  Occupational History  . Occupation: retired  Tobacco Use  . Smoking status: Former Smoker    Years: 20.00    Types: Cigarettes, Pipe, Cigars    Quit date: 09/09/1968    Years since quitting: 52.0  . Smokeless tobacco: Never Used  . Tobacco comment: smoking cigars- quit in 13   Substance and Sexual Activity  . Alcohol use: Yes    Alcohol/week: 1.0 standard drink    Types: 1 Cans of beer per week    Comment: occasionally   . Drug use: [name]  . Sexual activity: Never  Other Topics Concern  . 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     widowed    Three children, One in [place], one in [place] one in [place]   [facility] - 2 : youngest daughter lives with him   Social Determinants of Health   Financial Resource Strain: Low Risk   . Difficulty of Paying Living Expenses: Not hard at all  Food Insecurity: [name] Food Insecurity  . Worried About Programme researcher, broadcasting/film/video in the Last Year: Never true  . Ran Out of Food in the Last Year: Never true  Transportation Needs: [name] Transportation Needs  . Lack of Transportation (Medical): [name]  . Lack of Transportation (Non-Medical): [name]  Physical Activity: Sufficiently Active  . Days of Exercise per Week: 5 days  . Minutes of Exercise per Session: 50 min  Stress: Stress Concern Present  . Feeling of Stress : To some extent  Social Connections: Moderately Integrated  . Frequency of Communication with Friends and Family: More than three times a week  . Frequency of Social Gatherings with Friends and Family: More than three times a week  . Attends Religious Services: More than 4 times per year  . Active Member of Clubs or Organizations: Yes  . Attends Banker Meetings: More than 4 times per year  . Marital Status: Widowed   Allergies  Allergen Reactions   . Bee Venom Anaphylaxis   Family History  Problem Relation Age of Onset  . Drug abuse Other   . Cancer Other   . Heart disease Other   . Lung disease Other   . Diabetes Mother   . Diabetes Sister   . Colon cancer Neg Hx   . Stomach cancer Neg Hx     Current Outpatient Medications (Endocrine & Metabolic):  .  glipiZIDE (GLUCOTROL) 5 MG tablet, TAKE 1 TABLET BY MOUTH 2  TIMES DAILY BEFORE A MEAL .  insulin NPH Human (NOVOLIN N RELION) 100 UNIT/ML injection, Inject 14 units at bedtime under skin .  metFORMIN (GLUCOPHAGE) 1000 MG tablet, TAKE 1 TABLET BY MOUTH  TWICE DAILY WITH MEALS .  methylPREDNISolone (MEDROL DOSEPAK) 4 MG TBPK tablet, Take as directed .  Semaglutide,0.25 or 0.5MG /DOS, (OZEMPIC, 0.25 OR 0.5 MG/DOSE,) 2 MG/1.5ML SOPN, Inject 0.5 mg into the skin once a week.  Current Outpatient Medications (Cardiovascular):  .  atorvastatin (LIPITOR) 40 MG tablet, Take 1 tablet (40 mg total) by mouth daily at 6 PM. .  EPINEPHrine (EPI-PEN) 0.3 mg/0.3 mL DEVI, Inject 0.3 mg into the muscle daily as needed (allergic reaction). Marland Kitchen  lisinopril (ZESTRIL) 20 MG tablet, Take 20 mg by mouth daily. .  metoprolol succinate (TOPROL-XL) 100 MG 24 hr tablet, Take 100 mg by mouth daily.  .  nitroGLYCERIN (NITROSTAT) 0.4 MG SL tablet, Place 1 tablet (0.4 mg total) under the tongue every 5 (five) minutes as needed for chest pain. Marland Kitchen  lisinopril (PRINIVIL,ZESTRIL) 5 MG tablet, Take 1 tablet (5 mg total) by mouth daily.  Current Outpatient Medications (Respiratory):  .  sodium chloride (OCEAN) 0.65 % SOLN nasal spray, Place 2 sprays into both nostrils 3 (three) times daily.   Current Outpatient Medications (Hematological):  Marland Kitchen  Cyanocobalamin 5000 MCG TBDP, Take 5,000 mcg by mouth every 3 (three) days.   Current Outpatient Medications (Other):  .  cyclobenzaprine (FLEXERIL) 10 MG tablet, Take 1 tablet (10 mg total) by mouth at bedtime. .  diclofenac sodium (VOLTAREN) 1 % GEL, Apply 4 g topically 4  (four) times daily. Marland Kitchen  gabapentin (NEURONTIN) 100 MG capsule, Take 1 capsule (100 mg total) by mouth at bedtime. .  Insulin Syringes, Disposable, U-100 0.5 ML MISC, Use 1x a day .  Multiple Vitamins-Minerals (ICAPS PO), Take 1 tablet by mouth daily. .  Multiple Vitamins-Minerals (PRESERVISION AREDS 2 PO), Take 1 tablet by mouth daily. Marland Kitchen  neomycin-polymyxin-dexameth (MAXITROL) 0.1 % OINT, Place 1 application into the left eye 2 (two) times daily. Glory Rosebush DELICA LANCETS FINE MISC, Use to check sugar daily .  ONETOUCH VERIO test strip, USE 1 STRIP TO CHECK GLUCOSE ONCE DAILY .  tamsulosin (FLOMAX) 0.4 MG CAPS capsule, TAKE 1 CAPSULE BY MOUTH  DAILY .  vitamin E 400 UNIT capsule, Take 400 Units by mouth daily. .  ASTAXANTHIN PO, Take 1 tablet by mouth daily. Marland Kitchen  Bioflavonoid Products (VITAMIN C) CHEW, Chew 1 tablet by mouth daily.    Reviewed prior external information including notes and imaging from  primary care provider As well as notes that were available from care everywhere and other healthcare systems.  Past medical history, social, surgical and family history all reviewed in electronic medical record.  [name] pertanent information unless stated regarding to the chief complaint.   Review of Systems:  [name] headache, visual changes, nausea, vomiting, diarrhea, constipation, dizziness, abdominal pain, skin rash, fevers, chills, night sweats, weight loss, swollen lymph nodes, body aches, joint swelling, chest pain, shortness of breath, mood changes. POSITIVE muscle aches mild  Objective  Blood pressure 122/72, pulse 67, height 5\' 11"  (1.803 m), weight 237 lb (107.5 kg), SpO2 99 %.   General: [name] apparent distress alert and oriented x3 mood and affect normal, dressed appropriately.  HEENT: Pupils equal, extraocular movements intact  Respiratory: Patient's speak in full sentences and does not appear short of breath  Cardiovascular: [name] lower extremity edema, non tender, [name] erythema  Gait  normal with good balance and coordination.  MSK:  .  Low back exam does show that patient does have some mild loss of lordosis.  Poor core strength noted.  Patient does have mild tightness  with Corky Sox on the left but improvement from previous exam.  Minimal discomfort over the left greater trochanteric area.  This is an improvement as well.  Negative straight leg test but still tightness with straight leg test on the left greater than right     Impression and Recommendations:     The above documentation has been reviewed and is accurate and complete Lyndal Pulley, DO

## 2020-09-13 ENCOUNTER — Other Ambulatory Visit: Payer: Self-pay

## 2020-09-13 ENCOUNTER — Telehealth: Payer: Self-pay | Admitting: Pharmacist

## 2020-09-13 ENCOUNTER — Encounter: Payer: Self-pay | Admitting: Family Medicine

## 2020-09-13 ENCOUNTER — Other Ambulatory Visit: Payer: Self-pay | Admitting: Internal Medicine

## 2020-09-13 ENCOUNTER — Ambulatory Visit: Payer: Medicare Other | Admitting: Family Medicine

## 2020-09-13 DIAGNOSIS — M7062 Trochanteric bursitis, left hip: Secondary | ICD-10-CM

## 2020-09-13 DIAGNOSIS — M5416 Radiculopathy, lumbar region: Secondary | ICD-10-CM | POA: Diagnosis not present

## 2020-09-13 DIAGNOSIS — E1159 Type 2 diabetes mellitus with other circulatory complications: Secondary | ICD-10-CM

## 2020-09-13 NOTE — Assessment & Plan Note (Signed)
Stable at the moment.  Responding well to the gabapentin.  Do believe it is playing some of the role in the leg pain and back pain.  Responding very well to the 100 mg of gabapentin.  We will keep this time.  Secondary to patient's only having 1 kidney.  Follow-up with me again in 6 weeks

## 2020-09-13 NOTE — Assessment & Plan Note (Signed)
Patient has some mild tightness on the side but overall not significantly tender to palpation.  Discussed with patient in great length.  Patient wants to continue with conservative therapy before we try the injection.  We discussed with patient's A1c at 7.9 would much like patient's blood sugars to be closer to 150 if possible.  Patient will work hard on this.  Follow-up with me again 6 weeks

## 2020-09-13 NOTE — Patient Instructions (Signed)
Hold on injection Continue everything else including gabapentin See me again in 6 weeks to discuss

## 2020-09-13 NOTE — Chronic Care Management (AMB) (Signed)
I left the patient a message about his upcoming appointment on 09/14/2000 @ 8:30 am with the clinical pharmacist. He was asked to please have all medication on hand to review the pharmacist.  Berenice Bouton, [facility] Clinical Pharmacy Assistant [phone]

## 2020-09-14 ENCOUNTER — Ambulatory Visit: Payer: Medicare Other | Admitting: Pharmacist

## 2020-09-14 DIAGNOSIS — I10 Essential (primary) hypertension: Secondary | ICD-10-CM

## 2020-09-14 DIAGNOSIS — E1165 Type 2 diabetes mellitus with hyperglycemia: Secondary | ICD-10-CM

## 2020-09-14 NOTE — Chronic Care Management (AMB) (Signed)
Chronic Care Management Pharmacy  Name: JARUIS CASSELBERRY  MRN: [id number] DOB: 1940/12/12  Initial Questions: 1. Have you seen any other providers since your last visit? NA 2. Any changes in your medicines or health? Yes - Toprol increased by cardiologist  Chief Complaint/ HPI [name],  80 y.o. , male presents for their Follow-Up CCM visit with the clinical pharmacist via telephone due to COVID-19 Pandemic.  PCP : [name], NP  Their chronic conditions include: DM, HTN, HLD, History of NSTEMI (2017), vitamin B12 deficiency, right sided sciatica,. BPH  Office Visits: 03/08/2020 - [name], NP- Patient presented with left sided sciatica. Patient reported short course of Flexeril and Medrol dosepak helped ease the pain but did not resolve it. Pt reports pain has improved further with stretching exercises at home and walking at the [facility]. Prescribed Medrol dosepak.   6/02/2021[name], NP- Patient presented for office visit for pain radiating down the outside of his left leg. For left sided sciatica, patient advised to use warm compress as well as gentle stretching exercises. Patient prescribed Flexeril at night and Medrol Dosepak.   2/09/2021[name], NP- Patient presented for virtual visit for concerns of BPH (symptoms reported: decreased stream, nocturia 3 times a night, incomplete bladder emptying, and urinary frequency. Patient to trial tamulosin 0.4mg  daily. Patient to follow up if symptoms have not improved.   Consult Visit:  09/14/19 Hulan Saas, DO (sports medicine): Patient presented for left hip follow up.  08/21/20 Nobie Putnam, MD (endocrinology): Patient presented for DM follow up. A1c increased to 7.9%. Prescribed Ozempic 0.5 mg weekly.  08/09/20 Hulan Saas, DO (sports medicine): Patient presented for left hip pain and low back pain follow up. Prescribed gabapentin 100 mg at bedtime.  05/03/2020 - Endocrinology- Henrine Screws, MD -  Patient presented for DM follow up. A1C improved slightly from 7.4  to 7.3%. Patient did not start taking NPH 14 units at bedtime and admits to forgetting night time doses of glipizide and metformin.   12/22/2019- Endocrinology- Henrine Screws, MD- Patient presented for office visit for DM follow up. No major interventions made. Patient opted to work on diet modifications to improve BG readings. Patient to continue metformin 1000mg  twice daily with meals and glipizide 10mg  twice daily before meals. Patient to check sugars later in the day as well. Patient to return in 3 to 4 months with sugar log.   Medications: Outpatient Encounter Medications as of 09/14/2020  Medication Sig Note  . atorvastatin (LIPITOR) 40 MG tablet Take 1 tablet (40 mg total) by mouth daily at 6 PM.   . Cyanocobalamin 5000 MCG TBDP Take 5,000 mcg by mouth every 3 (three) days.    . cyclobenzaprine (FLEXERIL) 10 MG tablet Take 1 tablet (10 mg total) by mouth at bedtime.   . diclofenac sodium (VOLTAREN) 1 % GEL Apply 4 g topically 4 (four) times daily.   Marland Kitchen EPINEPHrine (EPI-PEN) 0.3 mg/0.3 mL DEVI Inject 0.3 mg into the muscle daily as needed (allergic reaction).   . gabapentin (NEURONTIN) 100 MG capsule Take 1 capsule (100 mg total) by mouth at bedtime.   . insulin NPH Human (NOVOLIN N RELION) 100 UNIT/ML injection Inject 14 units at bedtime under skin (Patient not taking: Reported on 09/19/2020)   . Insulin Syringes, Disposable, U-100 0.5 ML MISC Use 1x a day   . lisinopril (ZESTRIL) 20 MG tablet Take 20 mg by mouth daily.   . metFORMIN (GLUCOPHAGE) 1000 MG tablet TAKE 1 TABLET  BY MOUTH  TWICE DAILY WITH MEALS   . metoprolol succinate (TOPROL-XL) 100 MG 24 hr tablet Take 100 mg by mouth daily.    . Multiple Vitamins-Minerals (ICAPS PO) Take 1 tablet by mouth daily.   . Multiple Vitamins-Minerals (PRESERVISION AREDS 2 PO) Take 1 tablet by mouth daily.   Marland Kitchen neomycin-polymyxin-dexameth (MAXITROL) 0.1 % OINT Place 1 application  into the left eye 2 (two) times daily.   . nitroGLYCERIN (NITROSTAT) 0.4 MG SL tablet Place 1 tablet (0.4 mg total) under the tongue every 5 (five) minutes as needed for chest pain.   Glory Rosebush DELICA LANCETS FINE MISC Use to check sugar daily   . ONETOUCH VERIO test strip USE 1 STRIP TO CHECK GLUCOSE ONCE DAILY   . Semaglutide,0.25 or 0.5MG /DOS, (OZEMPIC, 0.25 OR 0.5 MG/DOSE,) 2 MG/1.5ML SOPN Inject 0.5 mg into the skin once a week.   . sodium chloride (OCEAN) 0.65 % SOLN nasal spray Place 2 sprays into both nostrils 3 (three) times daily.   . tamsulosin (FLOMAX) 0.4 MG CAPS capsule TAKE 1 CAPSULE BY MOUTH  DAILY   . vitamin E 400 UNIT capsule Take 400 Units by mouth daily.   . [DISCONTINUED] ASTAXANTHIN PO Take 1 tablet by mouth daily.   . [DISCONTINUED] Bioflavonoid Products (VITAMIN C) CHEW Chew 1 tablet by mouth daily.    . [DISCONTINUED] glipiZIDE (GLUCOTROL) 5 MG tablet TAKE 1 TABLET BY MOUTH 2  TIMES DAILY BEFORE A MEAL   . [DISCONTINUED] lisinopril (PRINIVIL,ZESTRIL) 5 MG tablet Take 1 tablet (5 mg total) by mouth daily. 02/16/2020: Dose increased to 20mg   . [DISCONTINUED] methylPREDNISolone (MEDROL DOSEPAK) 4 MG TBPK tablet Take as directed    No facility-administered encounter medications on file as of 09/14/2020.     Current Diagnosis/Assessment:  Goals Addressed            This Visit's Progress   . Pharmacy Care Plan       CARE PLAN ENTRY  Current Barriers:  . Chronic Disease Management support, education, and care coordination needs related to Hypertension, Hyperlipidemia, Diabetes, and History of NSTEMI (2017), vitamin B12 deficiency, Right sided sciatica, BPH   Hypertension . Pharmacist Clinical Goal(s): o Over the next 120 days, patient will work with PharmD and providers to maintain BP goal <130/80 . Current regimen:   Lisinopril 20 mg, 1 tablet once daily  Metoprolol succinate (Toprol-XL) 100mg , 1 tablet once daily . Interventions: o We discussed monitoring BP  at home and providing readings at next cardiology visit for further assessment. . Patient self care activities - Over the next 120 days, patient will: o Recommended to check BP at home weekly, document, and provide at future appointments o Ensure daily salt intake < 2300 mg/day  Hyperlipidemia/ History of NSTEMI, 2017 . Pharmacist Clinical Goal(s): o Over the next 120 days, patient will work with PharmD and providers to maintain LDL goal < 70 . Current regimen:  . Atorvastatin 40mg , 1 tablet daily at 6pm . Nitroglycerin 0.4mg  SL, 1 tablet under tongue every 5 minutes as needed for chest pain  . Patient self care activities - Over the next 120 days, patient will: o Continue medications as directed.  Diabetes . Pharmacist Clinical Goal(s): o Over the next 120 days, patient will work with PharmD and providers to achieve A1c goal <7% . Current regimen:   Glipizide 5mg , 1 tablet twice daily with a meal  Metformin 1000mg , 1 tablet twice daily with meals  . Interventions: o We discussed importance of  BG monitoring as instructed and alternating times of day in order to see full picture for blood sugars o Discussed following the healthy plate method which includes: . Fill half of your plate with nonstarchy vegetables, such as spinach, broccoli, carrots and tomatoes. Venida Jarvis a quarter of your plate with a protein, such as tuna, lean pork or chicken. Venida Jarvis the last quarter with a whole-grain item, such as brown rice, or a starchy vegetable, such as green peas or potatoes. . Include "good" fats such as nuts or avocados in small amounts. . Patient self care activities - Over the next 120 days, patient will: o Check blood sugar as instructed document, and provide at future appointments o Work on remembering to taking evening doses of glipizide and metformin and consider the use of an alarm to help as a reminder  Vitamin B12 deficiency (resolved) . Pharmacist Clinical Goal(s) o Over the next 120  days, patient will work with PharmD and providers to maintain vitamin B12 level: 211 - 911 pg/mL . Current regimen:  o Cyanocobalamin 5084mcg, 5,047mcg every 3rd day . Patient self care activities o Patient will continue medications as directed.   Right sided sciatica  . Pharmacist Clinical Goal(s) o Over the next 120 days, patient will work with PharmD and providers to ease the pain from sciatica . Patient self care activities o Patient will restart using stretching exercises to help with the pain  BPH (Benign prostatic hyperplasia)  . Pharmacist Clinical Goal(s) o Over the next 120 days, patient will work with PharmD and providers to minimize urinary symptoms. . Current regimen:  o tamsulosin 0.4mg , 1 capsule once daily . Patient self care activities o Patient will continue medications as directed.   Medication management . Pharmacist Clinical Goal(s): o Over the next 120 days, patient will work with PharmD and providers to achieve optimal medication adherence . Current pharmacy: [facility] . Interventions o Comprehensive medication review performed. o Continue current medication management strategy (pill box).  . Patient self care activities - Over the next 120 days, patient will: o Take medications as prescribed o Report any questions or concerns to PharmD and/or provider(s)  Please see past updates related to this goal by clicking on the "Past Updates" button in the selected goal           Diabetes   Recent Relevant Labs: Lab Results  Component Value Date/Time   HGBA1C 7.9 (A) 08/21/2020 08:38 AM   HGBA1C 7.3 (A) 05/03/2020 10:52 AM   HGBA1C 7.9 (H) 09/29/2018 02:23 AM   HGBA1C 6.9 (H) 08/08/2016 08:56 AM   MICROALBUR 13.8 (H) 08/08/2016 08:56 AM   MICROALBUR 11.4 (H) 07/21/2015 08:37 AM    Checking BG: Daily   Average FBG Readings: 130-140 mg/dl, after meals 180  Patient has failed these meds in past: Rybelsus (previous high copay); Insulin  NPH (Novolin N) - patient prefers to work on diet modifications  Patient is currently uncontrolled on the following medications:   Glipizide 5mg , 1 tablet twice daily with a meal  Metformin 1000mg , 1 tablet twice daily with meals   Last diabetic eye exam:  Lab Results  Component Value Date/Time   HMDIABEYEEXA No Retinopathy 11/12/2017 12:00 AM     Last diabetic foot exam:  Lab Results  Component Value Date/Time   HMDIABFOOTEX yes 11/24/2009 12:00 AM     We discussed: importance of BG monitoring and alternating times of day to assess complete BG control; medication adherence; patient does not  qualify for patient assistance for Rybelsus and other DM meds  -Following the healthy plate method which includes: . Fill half of your plate with nonstarchy vegetables, such as spinach, broccoli, carrots and tomatoes. Lenox Ahr a quarter of your plate with a protein, such as tuna, lean pork or chicken. Lenox Ahr the last quarter with a whole-grain item, such as brown rice, or a starchy vegetable, such as green peas or potatoes. . Include "good" fats such as nuts or avocados in small amounts. -Recommended alternative foods to try such as Banza (to replace pasta) and cauliflower rice -Exercise: patient goes to the [facility] and uses machines and is walking Mon-Fri   Plan Managed by endo (Dr. Elvera Lennox) and has a follow up in April 2022. Mailed healthy plate handout.  Continue current medications. Patient will work on medication adherence and alternating time of day he is checking BGs.  Hypertension  Denies dizziness/ lightheadedness.   Office blood pressures are  BP Readings from Last 3 Encounters:  09/13/20 122/72  08/21/20 128/80  08/09/20 112/88   Patient has failed these meds in the past: none   Patient checks BP at home: does not monitor at home.   Patient home BP readings are ranging: NA  Patient is controlled on:   Lisinopril 20 mg, 1 tablet once daily  Metoprolol succinate  (Toprol-XL) 100mg , 1 tablet once daily  We discussed monitoring BP at home and providing readings at next cardiology visit for further assessment.   Plan Managed by cardiologist. Continue current medications  Monitor BP at home and bring to next cardiology appointment.  Hyperlipidemia/ History of NSTEMI, 2017   Nitro- states has never needed to use. Most likely expired.   Lipid Panel     Component Value Date/Time   CHOL 127 10/08/2019 0949   TRIG 130.0 10/08/2019 0949   TRIG 149 07/25/2006 0854   HDL 46.10 10/08/2019 0949   LDLCALC 55 10/08/2019 0949   LDLDIRECT 139.8 07/08/2014 0845     The ASCVD Risk score (Goff DC Jr., et al., 2013) failed to calculate for the following reasons:   The patient has a prior MI or stroke diagnosis   Patient has failed these meds in past: none  Patient is currently controlled on the following medications:  . Atorvastatin 40mg , 1 tablet daily at 6pm . Nitroglycerin 0.4mg  SL, 1 tablet under tongue every 5 minutes as needed for chest pain   Plan Continue current medications  Vitamin B12 deficiency (resolved)   Vitamin B12: 748 (12/22/2019)  Patient is currently controlled on the following medications:   Cyanocobalamin , 5,055mcg every 3rd day  Plan Continue current medications  Right sided sciatica    Patient is currently uncontrolled on the following medications:  . None currently   Plan Continue current medications. Patient to continue incorporating more stretching exercises, especially prior to walking to ease pain.   BPH  Patient reports improvement since being on this.    Patient is currently controlled on the following medications:  . tamsulosin 0.4mg , 1 capsule once daily  Plan Continue current medications  OTC/ supplements  . Preservision Areds 2, 1 tablet once daily  . Astaxanthin, 1 tablet once daily   We discussed:   - astaxanthin and zeaxanthin (in Areds 2) are both carotenoids. Patient preferred to  continue both.   Plan Continue current medications   Vaccines   Reviewed and discussed patient's vaccination history.    Immunization History  Administered Date(s) Administered  . Fluad Quad(high Dose 65+)  07/05/2019  . Influenza Split 07/18/2011, 07/06/2012, 07/07/2012  . Influenza Whole 06/09/2006, 06/17/2008, 07/10/2009, 05/18/2010  . Influenza, High Dose Seasonal PF 06/11/2016, 06/06/2017, 05/26/2018  . Influenza,inj,Quad PF,6+ Mos 06/14/2013, 06/02/2014, 07/14/2015  . PFIZER(Purple Top)SARS-COV-2 Vaccination 09/28/2019, 10/19/2019, 06/18/2020  . Pneumococcal Conjugate-13 07/14/2014  . Pneumococcal Polysaccharide-23 06/09/2006  . Td 09/09/2001  . Tdap 02/20/2012    Plan Recommended patient receive Shingles vaccine at pharmacy.   Medication Management  Patient organizes medications: patient uses pill box  Primary pharmacy: [facility] Adherence: patient reports being more adherence with medications lately  We discussed: setting a timer to remember to take night time medications  Follow up Follow up visit with PharmD in 4 months (after endocrinologist visit).    Jeni Salles, PharmD Clinical Pharmacist [facility]

## 2020-09-18 DIAGNOSIS — H34811 Central retinal vein occlusion, right eye, with macular edema: Secondary | ICD-10-CM | POA: Diagnosis not present

## 2020-09-18 DIAGNOSIS — H35372 Puckering of macula, left eye: Secondary | ICD-10-CM | POA: Diagnosis not present

## 2020-09-18 DIAGNOSIS — H353133 Nonexudative age-related macular degeneration, bilateral, advanced atrophic without subfoveal involvement: Secondary | ICD-10-CM | POA: Diagnosis not present

## 2020-09-19 ENCOUNTER — Other Ambulatory Visit: Payer: Self-pay | Admitting: *Deleted

## 2020-09-19 ENCOUNTER — Encounter: Payer: Self-pay | Admitting: *Deleted

## 2020-09-19 NOTE — Patient Outreach (Signed)
[facility]) Care Management [place] Telephone Outreach  09/19/2020  Charles [name] Jan 15, 1941 [id number]  Successful telephone outreach attempt to Ascencion Dike, 80 y/o male referred to [facility] CM 08/22/20 on routine MD (endocrinology) referral for medication assistance with diabetes medications and self-health management of DM.  Patient has had no recent hospitalizations.  Patient has history including, but not limited to, DM-II with neuropathy (most recent A1-C 7.9, 08/21/20); HTN/ HLD; renal cell carcinoma with previous nephrectomy; previous NSTEMI; obesity.  HIPAA/ identity verified with patient; he reports "doing fine;" denies clinical concerns; reports remains independent in all self- care activities. Confirms that [facility] Pharmacist confirmed that he is not eligible for patient assistance with Ozempic; confirms he is not taking this medication.  Confirms that he and orthopedic provider decided against steroid injection for (L) knee pain; reports ongoing manageable pain levels with use of gabapentin.  Patient sounds to be in no distress throughout call today.  -- initial assessment completed and sent to PCP via secure messaging through EHR -- confirmed patient continues monitoring/ recording blood sugars at home; reviewed all with patient; reports consistent fasting/ post- prandial blood sugars-- patient reports consistent blood sugars between 130-150 -- confirmed no recent low blood sugars at home: reinforced previously provided education around signs/ symptoms hypoglycemia and corresponding action plan: patient able to independently verbalize same with minimal prompting -- reviewed current medications for DM and updated medication list in EHR accordingly; confirmed patient attended recent appointment 09/14/20 with [facility] Pharmacist embedded in PCP appointment and that full medication review was completed  -- discussed A1-C goal and correlation of blood sugars at home with A1-C  levels: mailed printed EMMI education to patient around A1-C  -- confirmed patient received "Living Well with Diabetes" booklet- reviewed information with patient -- confirmed patient continues following low carb/ low sugar diet -- reviewed upcoming provider appointments with patient and confirmed that he is aware of all and has plans to attend as scheduled; continues driving self to appointments -- confirmed patient continues to attend gym daily for structured workout/ activity- positive reinforcement provided  Patient denies further issues, concerns, or problems today. I confirmed that patient hasmy direct phone number, the main [facility] CM office phone number, and the [facility] CM 24-hour nurse advice phone number should issues arise prior to next [place] outreach within next 2-3 months.  Explained that patient may be hearing form another [facility] RN CM with subsequent outreaches; he verbalizes understanding.   Encouraged patient to contact me directly if needs, questions, issues, or concerns arise prior to next [facility] CM outreach; patient agreed to do so.  Plan:  Patient will take medications as prescribed and will attend all scheduled provider appointments  Patient will promptly notify care providers for any new concerns/ issues/ problems that arise  Patient will continue monitoring fasting daily blood sugars  Patient will continue following dietary/ activity recommendations in setting of DM  I will share today's note/ care plan with patient's PCP as [facility] RN CM initial assessment  [facility] CM outreach to continue with follow up phone call in 2- 3 months, unless indicated sooner  Oneta Rack, RN, BSN, [place] Care Management  [phone]

## 2020-09-19 NOTE — Patient Instructions (Signed)
Goals Addressed            This Visit's Progress   . [facility] CM: Maintain My Quality of Life   On track    Timeframe:  Long-Range Goal Priority:  Medium Start Date:   08/25/20                          Expected End Date:       11/23/20                Follow Up Date by 11/23/20   - discuss my treatment options with the doctor or nurse - do one enjoyable thing every day - make shared treatment decisions with doctor - spend time outdoors at least 3 times a week    Why is this important?    Having a long-term illness can be scary.   It can also be stressful for you and your caregiver.   These steps may help.    Notes:   09/19/20: -- Always ask your doctors to address any questions you have around your treatment plan -- Keep up the great work staying active: it is one of the most important things you can do to stay healthy over time    . COMPLETED: [facility] CM: Make and Keep All Appointments   On track    Timeframe:  Short-Term Goal Priority:  Medium Start Date:      08/25/20                       Expected End Date:        09/25/19               Follow Up Date by 09/22/20   - call to cancel if needed - keep a calendar with appointment dates    Why is this important?    Part of staying healthy is seeing the doctor for follow-up care.   If you forget your appointments, there are some things you can do to stay on track.    Notes:   08/25/20: Doristine Devoid job attending all of your medical/ doctor appointments: keep up the great work! -- Contact your care providers if you experience lack of transportation that would cause you to miss your scheduled appointments: they may be able to provide resources for transportation  09/19/20: -- [place] job attending all provider appointments -- we reviewed your upcoming appointments today; I am glad you have plans to attend all as scheduled- if you need to re-schedule these visits please call to do so    . [facility] CM: Manage My Medicine   On track     Timeframe:  Long-Range Goal Priority:  Medium Start Date:      08/25/20                       Expected End Date:       11/23/20               Follow Up Date by 11/23/20   - call for medicine refill 2 or 3 days before it runs out - call if I am sick and can't take my medicine - keep a list of all the medicines I take; vitamins and herbals too - use a pillbox to sort medicine - use an alarm clock or phone to remind me to take my medicine    Why is this important?   Marland Kitchen  These steps will help you keep on track with your medicines.   Notes:   08/25/20: -- I have reached out the the Pharmacist at your PCP office to facilitate your request for patient assistance with affording your newly prescribed medications for DM: please stay in touch with the pharmacist at your PCP office  09/19/20: -- please continue to work with the Pharmacist at your PCP office to make sure your medications are optimized -- I updated your medical record to reflect your report today of current medications you take for Diabetes    . [facility] CM: Monitor and Manage My Blood Sugar-Diabetes Type 2   On track    Timeframe:  Long-Range Goal Priority:  Medium Start Date:    08/25/20                         Expected End Date:          11/24/19             Follow Up Date by 11/23/20    - check blood sugar at prescribed times - check blood sugar before and after exercise - check blood sugar if I feel it is too high or too low - enter blood sugar readings and medication or insulin into daily log - take the blood sugar log to all doctor visits - take the blood sugar meter to all doctor visits    Why is this important?    Checking your blood sugar at home helps to keep it from getting very high or very low.   Writing the results in a diary or log helps the doctor know how to care for you.   Your blood sugar log should have the time, date and the results.   Also, write down the amount of insulin or other medicine that you  take.   Other information, like what you ate, exercise done and how you were feeling, will also be helpful.     Notes:   08/25/20: -- Good job consistently monitoring your fasting blood sugars every day- keep up the good work -- we will review all of your recent blood sugars at home when we talk next time by phone; please be listening out for my next phone call -- please read and review the attached information around dietary strategies for blood sugar control: we will talk about this with future phone calls --  I have placed the "Living Well With Diabetes" booklet in the mail to you- please be looking for it, and review  09/19/20: -- [place] job consistently checking your blood sugars at home: continue to monitor your blood sugar readings regularly- it is a good idea to monitor your blood sugars first thing in the morning (fasting) and alternate with readings that are taken 2 hours after a meal- this will allow your doctor to see how well your current medications are working.  From reviewing your blood sugars today, it appears you are on track with managing your diabetes -- Randie [employer] job making sure you have a plan when you start to feel as if your blood sugars are low; I am glad you have not had any recent low blood sugars- if your blood sugars start to run low on a regular basis, you should contact your endocrinology provider -- I am glad you are reviewing the "living Well with Diabetes" booklet that I mailed to you -- [place] job following your low sugar/ low carbohydrate diet-- keep up  the great work! -- Keep up the great work getting regular structured activity     . [facility] CM: Set My Target A1C-Diabetes Type 2   On track    Timeframe:  Long-Range Goal Priority:  Medium Start Date:     08/25/20                        Expected End Date:    11/23/20                   Follow Up Date by 11/23/20   - set target A1C    Why is this important?    Your target A1C is decided together by you and your  doctor.   It is based on several things like your age and other health issues.    Notes:   08/25/20: -- we reviewed your recent A1-C results today; please read over the attached information and let me know if you have any questions  09/19/20: -- I have mailed you additional educational material that will allow you to correlate your blood sugars at home with you A1-C trends; please review this -- You have reported your A1-C goal as "7.0;" using the chart I mailed to you, you will get an idea of where you are in meeting your goal

## 2020-09-23 ENCOUNTER — Other Ambulatory Visit: Payer: Self-pay | Admitting: Internal Medicine

## 2020-09-23 DIAGNOSIS — N182 Chronic kidney disease, stage 2 (mild): Secondary | ICD-10-CM

## 2020-09-23 DIAGNOSIS — E1122 Type 2 diabetes mellitus with diabetic chronic kidney disease: Secondary | ICD-10-CM

## 2020-09-23 DIAGNOSIS — E538 Deficiency of other specified B group vitamins: Secondary | ICD-10-CM

## 2020-09-28 NOTE — Patient Instructions (Addendum)
Hi Charles [name],  It was great to get to speak with you again over the phone! I sent a message to Dr. Cruzita Lederer to let her know what we discussed. I also mailed you the healthy plate handout that I hope is helpful. Don't forget about some of the healthier and lower carb swaps we discussed such as using Banza to replace pasta or cauliflower rice to replace rice. Definitely keep up the good work with exercising regularly! I want to encourage you to try to be more consistent about checking your blood sugars at other times during the day just to see if you are having some highs at a particular time. I will have my assistant Mimi reach out in a month or so to check in on you!  As a reminder, don't forget to look into getting your shingles shot at the pharmacy as well.  Let me know if there is anything else I can do to help before our follow up!  Best, Charles [name]  Jeni Salles, PharmD [facility] Clinical Pharmacist [place] [facility] [place]   Visit Information  Goals Addressed            This Visit's Progress   . Pharmacy Care Plan       CARE PLAN ENTRY  Current Barriers:  . Chronic Disease Management support, education, and care coordination needs related to Hypertension, Hyperlipidemia, Diabetes, and History of NSTEMI (2017), vitamin B12 deficiency, Right sided sciatica, BPH   Hypertension . Pharmacist Clinical Goal(s): o Over the next 120 days, patient will work with PharmD and providers to maintain BP goal <130/80 . Current regimen:   Lisinopril 20 mg, 1 tablet once daily  Metoprolol succinate (Toprol-XL) 100mg , 1 tablet once daily . Interventions: o We discussed monitoring BP at home and providing readings at next cardiology visit for further assessment. . Patient self care activities - Over the next 120 days, patient will: o Recommended to check BP at home weekly, document, and provide at future appointments o Ensure daily salt intake < 2300 mg/day  Hyperlipidemia/  History of NSTEMI, 2017 . Pharmacist Clinical Goal(s): o Over the next 120 days, patient will work with PharmD and providers to maintain LDL goal < 70 . Current regimen:  . Atorvastatin 40mg , 1 tablet daily at 6pm . Nitroglycerin 0.4mg  SL, 1 tablet under tongue every 5 minutes as needed for chest pain  . Patient self care activities - Over the next 120 days, patient will: o Continue medications as directed.  Diabetes . Pharmacist Clinical Goal(s): o Over the next 120 days, patient will work with PharmD and providers to achieve A1c goal <7% . Current regimen:   Glipizide 5mg , 1 tablet twice daily with a meal  Metformin 1000mg , 1 tablet twice daily with meals  . Interventions: o We discussed importance of BG monitoring as instructed and alternating times of day in order to see full picture for blood sugars o Discussed following the healthy plate method which includes: . Fill half of your plate with nonstarchy vegetables, such as spinach, broccoli, carrots and tomatoes. Venida Jarvis a quarter of your plate with a protein, such as tuna, lean pork or chicken. Venida Jarvis the last quarter with a whole-grain item, such as brown rice, or a starchy vegetable, such as green peas or potatoes. . Include "good" fats such as nuts or avocados in small amounts. . Patient self care activities - Over the next 120 days, patient will: o Check blood sugar as instructed document, and provide at future  appointments o Work on remembering to taking evening doses of glipizide and metformin and consider the use of an alarm to help as a reminder  Vitamin B12 deficiency (resolved) . Pharmacist Clinical Goal(s) o Over the next 120 days, patient will work with PharmD and providers to maintain vitamin B12 level: 211 - 911 pg/mL . Current regimen:  o Cyanocobalamin 5019mcg, 5,043mcg every 3rd day . Patient self care activities o Patient will continue medications as directed.   Right sided sciatica  . Pharmacist Clinical  Goal(s) o Over the next 120 days, patient will work with PharmD and providers to ease the pain from sciatica . Patient self care activities o Patient will restart using stretching exercises to help with the pain  BPH (Benign prostatic hyperplasia)  . Pharmacist Clinical Goal(s) o Over the next 120 days, patient will work with PharmD and providers to minimize urinary symptoms. . Current regimen:  o tamsulosin 0.4mg , 1 capsule once daily . Patient self care activities o Patient will continue medications as directed.   Medication management . Pharmacist Clinical Goal(s): o Over the next 120 days, patient will work with PharmD and providers to achieve optimal medication adherence . Current pharmacy: [facility] . Interventions o Comprehensive medication review performed. o Continue current medication management strategy (pill box).  . Patient self care activities - Over the next 120 days, patient will: o Take medications as prescribed o Report any questions or concerns to PharmD and/or provider(s)  Please see past updates related to this goal by clicking on the "Past Updates" button in the selected goal         The patient verbalized understanding of instructions, educational materials, and care plan provided today and declined offer to receive copy of patient instructions, educational materials, and care plan.   Telephone follow up appointment with pharmacy team member scheduled for: 4 months  Viona Gilmore, [facility]

## 2020-09-29 ENCOUNTER — Other Ambulatory Visit: Payer: Self-pay | Admitting: *Deleted

## 2020-09-29 NOTE — Patient Outreach (Signed)
[facility]) Care Management [place] Coordination  09/29/2020  TRACE WIRICK 11/19/1940 [id number]  [place], 80 y/o male referred to [facility] CM 08/22/20 on routine MD (endocrinology) referral for medication assistance with diabetes medications and self-health management of DM. Patient has had no recent hospitalizations. 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  Received request from [place] leadership 09/27/20 to begin transition of pending patient care coordination team; sent note via secure messaging in EHR to assigned practice RN CM for future/ ongoing outreach.  Oneta Rack, RN, BSN, [employer] [facility] Care Management  [phone]

## 2020-10-06 ENCOUNTER — Encounter: Payer: Self-pay | Admitting: Adult Health

## 2020-10-06 ENCOUNTER — Other Ambulatory Visit: Payer: Self-pay

## 2020-10-06 ENCOUNTER — Ambulatory Visit (INDEPENDENT_AMBULATORY_CARE_PROVIDER_SITE_OTHER): Payer: Medicare Other | Admitting: Adult Health

## 2020-10-06 VITALS — BP 136/84 | Temp 98.2°F | Ht 71.0 in | Wt 255.0 lb

## 2020-10-06 DIAGNOSIS — E538 Deficiency of other specified B group vitamins: Secondary | ICD-10-CM | POA: Diagnosis not present

## 2020-10-06 DIAGNOSIS — R3912 Poor urinary stream: Secondary | ICD-10-CM | POA: Diagnosis not present

## 2020-10-06 DIAGNOSIS — E1159 Type 2 diabetes mellitus with other circulatory complications: Secondary | ICD-10-CM | POA: Diagnosis not present

## 2020-10-06 DIAGNOSIS — E1165 Type 2 diabetes mellitus with hyperglycemia: Secondary | ICD-10-CM | POA: Diagnosis not present

## 2020-10-06 DIAGNOSIS — N401 Enlarged prostate with lower urinary tract symptoms: Secondary | ICD-10-CM

## 2020-10-06 DIAGNOSIS — E785 Hyperlipidemia, unspecified: Secondary | ICD-10-CM

## 2020-10-06 DIAGNOSIS — Z0000 Encounter for general adult medical examination without abnormal findings: Secondary | ICD-10-CM | POA: Diagnosis not present

## 2020-10-06 DIAGNOSIS — I10 Essential (primary) hypertension: Secondary | ICD-10-CM

## 2020-10-06 LAB — CBC WITH DIFFERENTIAL/PLATELET
Basophils Absolute: 0.1 10*3/uL (ref 0.0–0.1)
Basophils Relative: 1.2 % (ref 0.0–3.0)
Eosinophils Absolute: 0.2 10*3/uL (ref 0.0–0.7)
Eosinophils Relative: 4.8 % (ref 0.0–5.0)
HCT: 39.1 % (ref 39.0–52.0)
Hemoglobin: 12.8 g/dL — ABNORMAL LOW (ref 13.0–17.0)
Lymphocytes Relative: 18.1 % (ref 12.0–46.0)
Lymphs Abs: 0.8 10*3/uL (ref 0.7–4.0)
MCHC: 32.7 g/dL (ref 30.0–36.0)
MCV: 89.9 fl (ref 78.0–100.0)
Monocytes Absolute: 0.5 10*3/uL (ref 0.1–1.0)
Monocytes Relative: 11.7 % (ref 3.0–12.0)
Neutro Abs: 2.9 10*3/uL (ref 1.4–7.7)
Neutrophils Relative %: 64.2 % (ref 43.0–77.0)
Platelets: 207 10*3/uL (ref 150.0–400.0)
RBC: 4.35 Mil/uL (ref 4.22–5.81)
RDW: 15.2 % (ref 11.5–15.5)
WBC: 4.6 10*3/uL (ref 4.0–10.5)

## 2020-10-06 LAB — COMPREHENSIVE METABOLIC PANEL
ALT: 22 U/L (ref 0–53)
AST: 23 U/L (ref 0–37)
Albumin: 4.1 g/dL (ref 3.5–5.2)
Alkaline Phosphatase: 72 U/L (ref 39–117)
BUN: 20 mg/dL (ref 6–23)
CO2: 29 mEq/L (ref 19–32)
Calcium: 9.2 mg/dL (ref 8.4–10.5)
Chloride: 103 mEq/L (ref 96–112)
Creatinine, Ser: 1.06 mg/dL (ref 0.40–1.50)
GFR: 66.74 mL/min (ref >60.00)
Glucose, Bld: 151 mg/dL — ABNORMAL HIGH (ref 70–99)
Potassium: 4.9 mEq/L (ref 3.5–5.1)
Sodium: 140 mEq/L (ref 135–145)
Total Bilirubin: 0.6 mg/dL (ref 0.2–1.2)
Total Protein: 6.6 g/dL (ref 6.0–8.3)

## 2020-10-06 LAB — LIPID PANEL
Cholesterol: 121 mg/dL (ref 0–200)
HDL: 46.2 mg/dL (ref >39.00)
LDL Cholesterol: 56 mg/dL (ref 0–99)
NonHDL: 74.63
Total CHOL/HDL Ratio: 3
Triglycerides: 92 mg/dL (ref 0.0–149.0)
VLDL: 18.4 mg/dL (ref 0.0–40.0)

## 2020-10-06 LAB — TSH: TSH: 2.94 u[IU]/mL (ref 0.35–4.50)

## 2020-10-06 LAB — PSA: PSA: 1.44 ng/mL (ref 0.10–4.00)

## 2020-10-06 LAB — VITAMIN D 25 HYDROXY (VIT D DEFICIENCY, FRACTURES): VITD: 19.55 ng/mL — ABNORMAL LOW (ref 30.00–100.00)

## 2020-10-06 LAB — VITAMIN B12: Vitamin B-12: 900 pg/mL (ref 211–911)

## 2020-10-06 NOTE — Addendum Note (Signed)
Addended by: Tessie Fass D on: 10/06/2020 09:39 AM   Modules accepted: Orders

## 2020-10-06 NOTE — Progress Notes (Signed)
Subjective:    Patient ID: Charles [name], male    DOB: [birth year], 80 y.o.   MRN: [id number]  HPI  Patient presents for yearly preventative medicine examination. He is a pleasant 80 year old male who  has a past medical history of Anemia, Arthritis, AVM (arteriovenous malformation) of colon, Biceps muscle tear, Cancer of kidney ([place]) (2000), Coronary artery disease, Eczema, GERD (gastroesophageal reflux disease), Hernia, ventral, Hyperlipidemia, Hypertension, Obesity, Personal history of adenomatous colonic polyps (07/20/2012), Pneumonia (1942; 1950s X 1), Type II diabetes mellitus ([place]), and Walking pneumonia (2000's X 1).  DM-he is followed by endocrinology, Dr. Cruzita Lederer.  Currently prescribed Metformin 1000 mg twice daily, glipizide 5 mg twice daily.  Most recent A1c was 7.9, this was up from 7.3.  At this time he was prescribed Ozempic 0.5 mg weekly but could not fill it due to cost.  Lab Results  Component Value Date   HGBA1C 7.9 (A) 08/21/2020   Hyperlipidemia/CAD -prescribed Lipitor 40 mg.  He denies myalgia or fatigue.  Has not experienced any chest pain or shortness of breath. Is seen by Cardiology, Dr. Terrence Dupont.  Lab Results  Component Value Date   CHOL 127 10/08/2019   HDL 46.10 10/08/2019   LDLCALC 55 10/08/2019   LDLDIRECT 139.8 07/08/2014   TRIG 130.0 10/08/2019   CHOLHDL 3 10/08/2019    Hypertension-takes lisinopril 20 mg and Toprol 100mg  daily.  He denies chest pain, shortness of breath, dizziness, lightheadedness, or headaches BP Readings from Last 3 Encounters:  10/06/20 136/84  09/13/20 122/72  08/21/20 128/80   H/o of NSTEMI - 2017. Had stent placed.   B12 Deficiency - takes 5000 mcg every third day.  Lab Results  Component Value Date   VITAMINB12 748 12/22/2019   BPH - uses Flomax 0.4 mg - reports symptoms are well controlled.   Dermatology Referral - would like to see dermatology for skin checks    All immunizations and health maintenance protocols were  reviewed with the patient and needed orders were placed.  Appropriate screening laboratory values were ordered for the patient including screening of hyperlipidemia, renal function and hepatic function. If indicated by BPH, a PSA was ordered.  Medication reconciliation,  past medical history, social history, problem list and allergies were reviewed in detail with the patient  Goals were established with regard to weight loss, exercise, and  diet in compliance with medications. He goes to the [facility] about 5 days a week  Wt Readings from Last 3 Encounters:  10/06/20 255 lb (115.7 kg)  09/13/20 237 lb (107.5 kg)  08/21/20 253 lb 12.8 oz (115.1 kg)   Review of Systems  Constitutional: Negative.   HENT: Positive for hearing loss (chronic).   Eyes: Negative.   Respiratory: Negative.   Cardiovascular: Negative.   Gastrointestinal: Negative.   Endocrine: Negative.   Genitourinary: Negative.   Musculoskeletal: Positive for arthralgias.  Skin: Negative.   Allergic/Immunologic: Negative.   Neurological: Negative.   Hematological: Negative.   Psychiatric/Behavioral: Negative.   All other systems reviewed and are negative.  Past Medical History:  Diagnosis Date  . Anemia    "when I was a lad"  . Arthritis    "right femur" (10/19/2015)  . AVM (arteriovenous malformation) of colon    2 - non-bleeding 2013  . Biceps muscle tear    right  . Cancer of kidney ([place]) 2000   left nephrectomy  . Coronary artery disease   . Eczema   . GERD (gastroesophageal reflux disease)   .  Hernia, ventral   . Hyperlipidemia   . Hypertension   . Obesity   . Personal history of adenomatous colonic polyps 07/20/2012   3 + adenomas 2009 07/20/2012 2 diminutive polyps    . Pneumonia 1942; 1950s X 1  . Type II diabetes mellitus ([place])   . Walking pneumonia 2000's X 1    Social History   Socioeconomic History  . Marital status: Widowed    Spouse name: Not on file  . Number of children: 3  . Years of  education: Not on file  . Highest education level: Not on file  Occupational History  . Occupation: retired  Tobacco Use  . Smoking status: Former Smoker    Years: 20.00    Types: Cigarettes, Pipe, Cigars    Quit date: 09/09/1968    Years since quitting: 52.1  . Smokeless tobacco: Never Used  . Tobacco comment: smoking cigars- quit in 13   Substance and Sexual Activity  . Alcohol use: Yes    Alcohol/week: 1.0 standard drink    Types: 1 Cans of beer per week    Comment: occasionally   . Drug use: No  . Sexual activity: Never  Other Topics Concern  . Not on file  Social History Narrative   Regular exercise: goes to the 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     widowed    Three children, One in [place], one in [place] one in [place] - 2 : youngest daughter lives with him   Social Determinants of Health   Financial Resource Strain: Low Risk   . Difficulty of Paying Living Expenses: Not hard at all  Food Insecurity: No Food Insecurity  . Worried About Charity fundraiser in the Last Year: Never true  . Ran Out of Food in the Last Year: Never true  Transportation Needs: No Transportation Needs  . Lack of Transportation (Medical): No  . Lack of Transportation (Non-Medical): No  Physical Activity: Sufficiently Active  . Days of Exercise per Week: 5 days  . Minutes of Exercise per Session: 50 min  Stress: Stress Concern Present  . Feeling of Stress : To some extent  Social Connections: Moderately Integrated  . Frequency of Communication with Friends and Family: More than three times a week  . Frequency of Social Gatherings with Friends and Family: More than three times a week  . Attends Religious Services: More than 4 times per year  . Active Member of Clubs or Organizations: Yes  . Attends Archivist Meetings: More than 4 times per year  . Marital Status: Widowed  Intimate Partner Violence: Not on  file    Past Surgical History:  Procedure Laterality Date  . CARDIAC CATHETERIZATION N/A 10/19/2015   Procedure: Left Heart Cath and Coronary Angiography;  Surgeon: Charolette Forward, MD;  Location: [place] [facility];  Service: Cardiovascular;  Laterality: N/A;  . CARDIAC CATHETERIZATION N/A 10/19/2015   Procedure: Coronary Stent Intervention;  Surgeon: Charolette Forward, MD;  Location: [place] [facility];  Service: Cardiovascular;  Laterality: N/A;  . CATARACT EXTRACTION, BILATERAL Bilateral 01/2018   Dr. Kathrin Penner did surgery and he developed MD and is seeing specialist now for MD treatment  . COLONOSCOPY     "nothing showed up this time"  . COLONOSCOPY W/ BIOPSIES AND POLYPECTOMY  X 1  . CORONARY ANGIOPLASTY    . FEMUR IM NAIL Right 07/26/2013  Procedure: INTRAMEDULLARY (IM) NAIL FEMORAL subtrochanteric;  Surgeon: Mauri Pole, MD;  Location: [facility];  Service: Orthopedics;  Laterality: Right;  . FRACTURE SURGERY    . INGUINAL HERNIA REPAIR Left >3 times  . INGUINAL HERNIA REPAIR Right 2012  . NEPHRECTOMY Left 2000  . TONSILLECTOMY  1940s    Family History  Problem Relation Age of Onset  . Drug abuse Other   . Cancer Other   . Heart disease Other   . Lung disease Other   . Diabetes Mother   . Diabetes Sister   . Colon cancer Neg Hx   . Stomach cancer Neg Hx     Allergies  Allergen Reactions  . Bee Venom Anaphylaxis    Current Outpatient Medications on File Prior to Visit  Medication Sig Dispense Refill  . atorvastatin (LIPITOR) 40 MG tablet Take 1 tablet (40 mg total) by mouth daily at 6 PM. 30 tablet 3  . Cyanocobalamin 5000 MCG TBDP Take 5,000 mcg by mouth every 3 (three) days.     . diclofenac sodium (VOLTAREN) 1 % GEL Apply 4 g topically 4 (four) times daily. 150 g 1  . EPINEPHrine (EPI-PEN) 0.3 mg/0.3 mL DEVI Inject 0.3 mg into the muscle daily as needed (allergic reaction).    . gabapentin (NEURONTIN) 100 MG capsule Take 1 capsule (100 mg total) by mouth at bedtime.  180 capsule 0  . glipiZIDE (GLUCOTROL) 5 MG tablet TAKE 1 TABLET BY MOUTH 2  TIMES DAILY BEFORE A MEAL 180 tablet 3  . Insulin Syringes, Disposable, U-100 0.5 ML MISC Use 1x a day 100 each 3  . lisinopril (ZESTRIL) 20 MG tablet Take 20 mg by mouth daily.    . metFORMIN (GLUCOPHAGE) 1000 MG tablet TAKE 1 TABLET BY MOUTH  TWICE DAILY WITH MEALS 180 tablet 3  . metoprolol succinate (TOPROL-XL) 100 MG 24 hr tablet Take 100 mg by mouth daily.     . Multiple Vitamins-Minerals (PRESERVISION AREDS 2 PO) Take 1 tablet by mouth daily.    Marland Kitchen neomycin-polymyxin-dexameth (MAXITROL) 0.1 % OINT Place 1 application into the left eye 2 (two) times daily.    . nitroGLYCERIN (NITROSTAT) 0.4 MG SL tablet Place 1 tablet (0.4 mg total) under the tongue every 5 (five) minutes as needed for chest pain. 25 tablet 1  . ONETOUCH DELICA LANCETS FINE MISC Use to check sugar daily 100 each 5  . ONETOUCH VERIO test strip USE 1 STRIP TO CHECK GLUCOSE ONCE DAILY 50 each 0  . tamsulosin (FLOMAX) 0.4 MG CAPS capsule TAKE 1 CAPSULE BY MOUTH  DAILY 90 capsule 3  . vitamin E 400 UNIT capsule Take 400 Units by mouth daily.     No current facility-administered medications on file prior to visit.    BP 136/84   Temp 98.2 F (36.8 C)   Ht 5\' 11"  (1.803 m) Comment: WITHOUT SHOES  Wt 255 lb (115.7 kg)   BMI 35.57 kg/m       Objective:   Physical Exam Vitals and nursing note reviewed.  Constitutional:      General: He is not in acute distress.    Appearance: Normal appearance. He is well-developed and normal weight.  HENT:     Head: Normocephalic and atraumatic.     Right Ear: Tympanic membrane, ear canal and external ear normal. There is no impacted cerumen.     Left Ear: Tympanic membrane, ear canal and external ear normal. There is no impacted cerumen.  Nose: Nose normal. No congestion or rhinorrhea.     Mouth/Throat:     Mouth: Mucous membranes are moist.     Pharynx: Oropharynx is clear. No oropharyngeal exudate  or posterior oropharyngeal erythema.  Eyes:     General:        Right eye: No discharge.        Left eye: No discharge.     Extraocular Movements: Extraocular movements intact.     Conjunctiva/sclera: Conjunctivae normal.     Pupils: Pupils are equal, round, and reactive to light.  Neck:     Vascular: No carotid bruit.     Trachea: No tracheal deviation.  Cardiovascular:     Rate and Rhythm: Normal rate and regular rhythm.     Pulses: Normal pulses.     Heart sounds: Normal heart sounds. No murmur heard. No friction rub. No gallop.   Pulmonary:     Effort: Pulmonary effort is normal. No respiratory distress.     Breath sounds: Normal breath sounds. No stridor. No wheezing, rhonchi or rales.  Chest:     Chest wall: No tenderness.  Abdominal:     General: Bowel sounds are normal. There is no distension.     Palpations: Abdomen is soft. There is no mass.     Tenderness: There is no abdominal tenderness. There is no right CVA tenderness, left CVA tenderness, guarding or rebound.     Hernia: No hernia is present.  Musculoskeletal:        General: No swelling, tenderness, deformity or signs of injury. Normal range of motion.     Right lower leg: No edema.     Left lower leg: No edema.  Lymphadenopathy:     Cervical: No cervical adenopathy.  Skin:    General: Skin is warm and dry.     Capillary Refill: Capillary refill takes less than 2 seconds.     Coloration: Skin is not jaundiced or pale.     Findings: No bruising, erythema, lesion or rash.  Neurological:     General: No focal deficit present.     Mental Status: He is alert and oriented to person, place, and time.     Cranial Nerves: No cranial nerve deficit.     Sensory: No sensory deficit.     Motor: No weakness.     Coordination: Coordination normal.     Gait: Gait normal.     Deep Tendon Reflexes: Reflexes normal.  Psychiatric:        Mood and Affect: Mood normal.        Behavior: Behavior normal.        Thought  Content: Thought content normal.        Judgment: Judgment normal.       Assessment & Plan:  1. Routine general medical examination at a health care facility - Follow up in one year or sooner if needed - Continue to work on weight loss through diet and exercie - CBC with Differential/Platelet; Future - Comprehensive metabolic panel; Future - Lipid panel; Future - TSH; Future - Vitamin B12; Future - VITAMIN D 25 Hydroxy (Vit-D Deficiency, Fractures); Future - Ambulatory referral to Dermatology  2. Essential hypertension - Well controlled. No change in medications  - CBC with Differential/Platelet; Future - Comprehensive metabolic panel; Future - Lipid panel; Future - TSH; Future - Vitamin B12; Future - VITAMIN D 25 Hydroxy (Vit-D Deficiency, Fractures); Future  3. Hyperlipidemia, unspecified hyperlipidemia type - Consider increase in statin  - CBC  with Differential/Platelet; Future - Comprehensive metabolic panel; Future - Lipid panel; Future - TSH; Future - Vitamin B12; Future - VITAMIN D 25 Hydroxy (Vit-D Deficiency, Fractures); Future  4. 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[place]) - Follow up with Endocrinology as directed  - CBC with Differential/Platelet; Future - Comprehensive metabolic panel; Future - Lipid panel; Future - TSH; Future - Vitamin B12; Future - VITAMIN D 25 Hydroxy (Vit-D Deficiency, Fractures); Future  5. Benign prostatic hyperplasia with weak urinary stream - Continue with flomax  - PSA; Future  6. Vitamin B12 deficiency  - CBC with Differential/Platelet; Future - Comprehensive metabolic panel; Future - Lipid panel; Future - TSH; Future - Vitamin B12; Future   [name]

## 2020-10-12 ENCOUNTER — Telehealth: Payer: Self-pay

## 2020-10-12 NOTE — Telephone Encounter (Signed)
  Chronic Care Management   Outreach Note  10/12/2020 Name: JDYN PARKERSON MRN: [id number] DOB: October 12, 1940  Referred by: [name], NP Reason for referral : Chronic Care Management (RNCM Transition to Chronic Case Management/DM, HTN, HDL)   An unsuccessful telephone outreach was attempted today. The patient was referred to the case management team for assistance with care management and care coordination.   Follow Up Plan: A HIPAA compliant phone message was left for the patient providing contact information and requesting a return call.  The care management team will reach out to the patient again over the next 14 days.   Peter Garter RN, Jackquline Denmark, CDE Care Management Coordinator [place] [facility] [phone], Mobile [phone]

## 2020-10-16 DIAGNOSIS — I4891 Unspecified atrial fibrillation: Secondary | ICD-10-CM | POA: Diagnosis not present

## 2020-10-16 DIAGNOSIS — I10 Essential (primary) hypertension: Secondary | ICD-10-CM | POA: Diagnosis not present

## 2020-10-16 DIAGNOSIS — I2510 Atherosclerotic heart disease of native coronary artery without angina pectoris: Secondary | ICD-10-CM | POA: Diagnosis not present

## 2020-10-16 DIAGNOSIS — E785 Hyperlipidemia, unspecified: Secondary | ICD-10-CM | POA: Diagnosis not present

## 2020-10-16 DIAGNOSIS — E119 Type 2 diabetes mellitus without complications: Secondary | ICD-10-CM | POA: Diagnosis not present

## 2020-10-18 ENCOUNTER — Ambulatory Visit (INDEPENDENT_AMBULATORY_CARE_PROVIDER_SITE_OTHER): Payer: Medicare Other

## 2020-10-18 DIAGNOSIS — I4891 Unspecified atrial fibrillation: Secondary | ICD-10-CM

## 2020-10-18 DIAGNOSIS — E1159 Type 2 diabetes mellitus with other circulatory complications: Secondary | ICD-10-CM | POA: Diagnosis not present

## 2020-10-18 DIAGNOSIS — E1165 Type 2 diabetes mellitus with hyperglycemia: Secondary | ICD-10-CM | POA: Diagnosis not present

## 2020-10-18 DIAGNOSIS — I10 Essential (primary) hypertension: Secondary | ICD-10-CM | POA: Diagnosis not present

## 2020-10-18 NOTE — Patient Instructions (Addendum)
Visit Information  PATIENT GOALS:  Goals Addressed            This Visit's Progress   . RNCM: Better self manage and track my diabetes -Diabetes Type 2       Timeframe:  Long-Range Goal Priority:  Medium Start Date:    10/18/20                       Expected End Date:        01/16/21            Follow Up Date by 11/16/20 at 9AM   - check blood sugar at prescribed times try checking 1 1/2-2 hours after eating - check blood sugar before and after exercise - check blood sugar if I feel it is too high or too low - enter blood sugar readings and medication or insulin into daily log - take the blood sugar log to all doctor visits - take the blood sugar meter to all doctor visits    Why is this important?    Checking your blood sugar at home helps to keep it from getting very high or very low.   Writing the results in a diary or log helps the doctor know how to care for you.   Your blood sugar log should have the time, date and the results.   Also, write down the amount of insulin or other medicine that you take.   Other information, like what you ate, exercise done and how you were feeling, will also be helpful.     Notes:     . RNCM: Develop and follow self management plan for Atrial Fibrillation       Timeframe:  Long-Range Goal Priority:  High Start Date:      10/18/20                       Expected End Date:      01/16/21                 Follow Up Date 11/16/20 at 9AM    - check pulse (heart) rate once a day - cut down alcohol use - make a plan to exercise regularly - make a plan to eat healthy - take medicine as prescribed  -review atrial fibrillation action plan View EMMI-Atrial Fibrillation   Why is this important?    Atrial fibrillation may have no symptoms. Sometimes the symptoms get worse or happen more often.   It is important to keep track of what your symptoms are and when they happen.   A change in symptoms is important to discuss with your doctor or  nurse.   Being active and healthy eating will also help you manage your heart condition.     Notes:      A-Fib ACTION PLAN Actions to Take if My Symptoms Get Worse   WHAT ZONE ARE YOU IN TODAY? Green, Yellow, or Red?    GREEN ZONE: This is your goal!  . No shortness of breath.  . No weakness.  . No dizziness. . No continuous racing of the heart.   YELLOW ZONE: Call your doctor TODAY to get help!  You may have one or more of the following:  . Racing or irregular heart beat that my be uncomfortable. . Shortness of breath with or without chest pain.  . Weakness (unusually tired or feeling faint).  . Dizziness.    RED ZONE:  EMERGENCY CALL 911!  Marland Kitchen Increased chest pain that is continuous.  . Fainting or near fainting.  . Struggling to breathe.  . New confusion or can't think clearly.  . Symptoms of Stroke: F - Face Drooping                                         A - Arms drift downward when both arms are raised                                         S - Slurred Speech                                         T - Time to call 911 immediately.     Consent to CCM Services: Mr. Stahnke was given information about Chronic Care Management services today including:  1. CCM service includes personalized support from designated clinical staff supervised by his physician, including individualized plan of care and coordination with other care providers 2. 24/7 contact phone numbers for assistance for urgent and routine care needs. 3. Service will only be billed when office clinical staff spend 20 minutes or more in a month to coordinate care. 4. Only one practitioner may furnish and bill the service in a calendar month. 5. The patient may stop CCM services at any time (effective at the end of the month) by phone call to the office staff. 6. The patient will be responsible for cost sharing (co-pay) of up to 20% of the service fee (after annual deductible is met).  Patient agreed to  services and verbal consent obtained.   Patient verbalizes understanding of instructions provided today and agrees to view in [place].   Telephone follow up appointment with care management team member scheduled for:  Peter Garter RN, [facility], CDE Care Management Coordinator [place] [facility] [phone], Mobile [phone]  CLINICAL CARE PLAN: Patient Care Plan: Atrial Fibrillation (Adult)    Problem Identified: Lack of self mangement plan for Atrial Fibrillation   Priority: High    Long-Range Goal: RNCM: Develolp and self manage atrial fibrilation   Start Date: 10/18/2020  Expected End Date: 01/16/2021  This Visit's Progress: On track  Priority: High  Note:   Current Barriers:  Marland Kitchen Knowledge Deficits related to new dx of atrial fibrillation Reported feeling SOB when at gym on Monday 10/16/20.  Saw Cardiologist on 10/16/20 and was dx with atrial fibrillation.  Started on Eliquis and Toprol increased.  . Chronic Disease Management support and education needs related to Atrial fibrillation Nurse Case Manager Clinical Goal(s):  Marland Kitchen Over the next 90 days, patient will verbalize understanding of plan for self management of atrial fibrillation . Over the next 90 days, patient will verbalize basic understanding of atrial fibrillation disease process and self health management plan as evidenced by decrease in symptoms, adherence to medications, keeping provider appts  Interventions:  . 1:1 collaboration with [name], NP regarding development and update of comprehensive plan of care as evidenced by provider attestation and co-signature . Inter-disciplinary care team collaboration (see longitudinal plan of care)  . Advised patient to call provider for worsening symptoms of atrial  fibrillation  . Provided education to patient re: atrial fibrillation disease process and atrial fibrillation action plan  . Reviewed medications with  patient and discussed importance of taking medications as ordered to lower heart rate and taking anticoagulant . Provided patient with written and EMMI educational materials related to atrial fibrillation . Reviewed scheduled/upcoming provider appointments including: Dr. Roselee Nova 11/10/20, Dr. Cruzita Lederer 12/19/20 . Discussed plans with patient for ongoing care management follow up and provided patient with direct contact information for care management team Patient Goals/Self-Care Activities Over the next 90 days, patient will: Patient will attend all scheduled provider appointments Patient will call provider office for new concerns or questions - check pulse (heart) rate once a day - cut down alcohol use - make a plan to exercise regularly - make a plan to eat healthy - take medicine as prescribed Follow Up Plan: Telephone follow up appointment with care management team member scheduled for: 11/16/20 at 9AM       Patient Care Plan: Diabetes Type 2 (Adult)    Problem Identified: RNCM: Lack of effective self management of Diabetes Type 2   Priority: Medium    Long-Range Goal: Effective self management of blood sugars and Diabetes Type 2   Start Date: 10/18/2020  Expected End Date: 01/16/2021  This Visit's Progress: On track  Priority: Medium  Note:   Objective:  Lab Results  Component Value Date   HGBA1C 7.9 (A) 08/21/2020 .   Lab Results  Component Value Date   CREATININE 1.06 10/06/2020   CREATININE 1.02 10/08/2019   CREATININE 1.21 10/26/2018   Current Barriers:  Marland Kitchen Knowledge Deficits related to basic Diabetes pathophysiology and self care/management . Does not adhere to provider recommendations re:  Case Manager Clinical Goal(s):  . patient will demonstrate improved adherence to prescribed treatment plan for diabetes self care/management as evidenced by: daily monitoring and recording of CBG  adherence to ADA/ carb modified diet exercise 3 days/week adherence to  prescribed medication regimen Interventions:  . Collaboration with [name], NP regarding development and update of comprehensive plan of care as evidenced by provider attestation and co-signature . Inter-disciplinary care team collaboration (see longitudinal plan of care) . Provided education to patient about basic DM disease process . Reviewed medications with patient and discussed importance of medication adherence . Discussed plans with patient for ongoing care management follow up and provided patient with direct contact information for care management team . Reviewed scheduled/upcoming provider appointments including: Dr. Cruzita Lederer endocrinologist 4/12/221 . Advised patient, providing education and rationale, to check cbg daily at varying times and record, calling provider for findings outside established parameters.   Patient Goals/Self-Care Activities . Over the next 90 days, patient will:  - Self administers oral medications as prescribed Attends all scheduled provider appointments Checks blood sugars as prescribed and utilize hyper and hypoglycemia protocol as needed Adheres to prescribed ADA/carb modified check blood sugar at prescribed times try checking 1 1/2-2 hours after eating - check blood sugar before and after exercise - check blood sugar if I feel it is too high or too low - enter blood sugar readings and medication or insulin into daily log - take the blood sugar log to all doctor visits - take the blood sugar meter to all doctor visits  Follow Up Plan: Telephone follow up appointment with care management team member scheduled for: 11/16/20 at 9 AM

## 2020-10-18 NOTE — Chronic Care Management (AMB) (Signed)
Chronic Care Management   CCM RN Visit Note  10/18/2020 Name: Charles [name] MRN: [id number] DOB: May 17, 1941  Subjective: Charles [name] is a 80 y.o. year old male who is a primary care patient of [name], NP. The care management team was consulted for assistance with disease management and care coordination needs.    Engaged with patient by telephone for initial visit in response to provider referral for case management and/or care coordination services.   Consent to Services:  The patient was given information about Chronic Care Management services, agreed to services, and gave verbal consent prior to initiation of services.  Please see initial visit note for detailed documentation.   Patient agreed to services and verbal consent obtained.   Assessment: Review of patient past medical history, allergies, medications, health status, including review of consultants reports, laboratory and other test data, was performed as part of comprehensive evaluation and provision of chronic care management services.   SDOH (Social Determinants of Health) assessments and interventions performed:  SDOH Interventions   Flowsheet Row Most Recent Value  SDOH Interventions   Food Insecurity Interventions Intervention Not Indicated  Financial Strain Interventions Intervention Not Indicated  Housing Interventions Intervention Not Indicated  Intimate Partner Violence Interventions Intervention Not Indicated  Physical Activity Interventions Intervention Not Indicated  Stress Interventions Intervention Not Indicated  Social Connections Interventions Intervention Not Indicated  Transportation Interventions Intervention Not Indicated  Alcohol Brief Interventions/Follow-up AUDIT Score <7 follow-up not indicated       CCM Care Plan  Allergies  Allergen Reactions  . Bee Venom Anaphylaxis    Outpatient Encounter Medications as of 10/18/2020  Medication Sig Note  . atorvastatin (LIPITOR) 40 MG tablet  Take 1 tablet (40 mg total) by mouth daily at 6 PM.   . Cyanocobalamin 5000 MCG TBDP Take 5,000 mcg by mouth every 3 (three) days.    Marland Kitchen ELIQUIS 5 MG TABS tablet Take 5 mg by mouth 2 (two) times daily.   Marland Kitchen EPINEPHrine (EPI-PEN) 0.3 mg/0.3 mL DEVI Inject 0.3 mg into the muscle daily as needed (allergic reaction).   . gabapentin (NEURONTIN) 100 MG capsule Take 1 capsule (100 mg total) by mouth at bedtime.   Marland Kitchen glipiZIDE (GLUCOTROL) 5 MG tablet TAKE 1 TABLET BY MOUTH 2  TIMES DAILY BEFORE A MEAL   . lisinopril (ZESTRIL) 20 MG tablet Take 20 mg by mouth daily.   . metFORMIN (GLUCOPHAGE) 1000 MG tablet TAKE 1 TABLET BY MOUTH  TWICE DAILY WITH MEALS   . metoprolol succinate (TOPROL-XL) 100 MG 24 hr tablet Take 100 mg by mouth daily.  10/18/2020: Increased to 150 mg   . Multiple Vitamins-Minerals (PRESERVISION AREDS 2 PO) Take 1 tablet by mouth daily.   . nitroGLYCERIN (NITROSTAT) 0.4 MG SL tablet Place 1 tablet (0.4 mg total) under the tongue every 5 (five) minutes as needed for chest pain.   Glory Rosebush DELICA LANCETS FINE MISC Use to check sugar daily   . ONETOUCH VERIO test strip USE 1 STRIP TO CHECK GLUCOSE ONCE DAILY   . tamsulosin (FLOMAX) 0.4 MG CAPS capsule TAKE 1 CAPSULE BY MOUTH  DAILY   . vitamin E 400 UNIT capsule Take 400 Units by mouth daily.   . diclofenac sodium (VOLTAREN) 1 % GEL Apply 4 g topically 4 (four) times daily.   . Insulin Syringes, Disposable, U-100 0.5 ML MISC Use 1x a day (Patient not taking: Reported on 10/18/2020)   . neomycin-polymyxin-dexameth (MAXITROL) 0.1 % OINT Place 1 application into  the left eye 2 (two) times daily.    No facility-administered encounter medications on file as of 10/18/2020.    Patient Active Problem List   Diagnosis Date Noted  . Epistaxis 09/29/2018  . Hyperkalemia 09/29/2018  . AKI (acute kidney injury) ([place]) 09/29/2018  . Lumbar radiculopathy 08/06/2017  . Greater trochanteric bursitis of left hip 07/04/2017  . Rotator cuff arthropathy of  left shoulder 01/24/2017  . AC (acromioclavicular) arthritis 01/24/2017  . Vitamin B12 deficiency 01/18/2016  . NSTEMI (non-ST elevated myocardial infarction) ([place]) 10/19/2015  . Acute non Q wave MI (myocardial infarction), initial episode of care ([place]) 10/19/2015  . Peripheral neuropathy 10/13/2015  . 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[place]) 07/14/2015  . Actinic keratosis of right cheek 10/04/2014  . Contact dermatitis and eczema 02/10/2014  . 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 Acute blood loss anemia 08/27/2013  . Insomnia 08/27/2013  . Hip fracture, right ([place]) 07/25/2013  . 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 Renal cell carcinoma ([place]) 07/25/2013  . History of colonic polyps 07/20/2012  . Hyperlipidemia 11/13/2007  . Obesity 11/13/2007  . Essential hypertension 04/23/2007   Conditions to be addressed/monitored:Atrial Fibrillation and DMII Care Plan : Atrial Fibrillation (Adult)  Updates made by Dimitri Ped, RN since 10/18/2020 12:00 AM    Problem: Lack of self mangement plan for Atrial Fibrillation   Priority: High    Long-Range Goal: RNCM: Develolp and self manage atrial fibrilation   Start Date: 10/18/2020  Expected End Date: 01/16/2021  This Visit's Progress: On track  Priority: High  Note:   Current Barriers:  Marland Kitchen Knowledge Deficits related to new dx of atrial fibrillation Reported feeling SOB when at gym on Monday 10/16/20.  Saw Cardiologist on 10/16/20 and was dx with atrial fibrillation.  Started on Eliquis and Toprol increased.  . Chronic Disease Management support and education needs related to Atrial fibrillation Nurse Case Manager Clinical Goal(s):  Marland Kitchen Over the next 90 days, patient will verbalize understanding of plan for self management of atrial fibrillation . Over the next 90 days, patient will verbalize basic understanding of atrial fibrillation disease process and self health management plan as evidenced by  decrease in symptoms, adherence to medications, keeping provider appts  Interventions:  . 1:1 collaboration with [name], NP regarding development and update of comprehensive plan of care as evidenced by provider attestation and co-signature . Inter-disciplinary care team collaboration (see longitudinal plan of care)  . Advised patient to call provider for worsening symptoms of atrial fibrillation  . Provided education to patient re: atrial fibrillation disease process and atrial fibrillation action plan  . Reviewed medications with patient and discussed importance of taking medications as ordered to lower heart rate and taking anticoagulant . Provided patient with written and EMMI educational materials related to atrial fibrillation . Reviewed scheduled/upcoming provider appointments including: Dr. Roselee Nova 11/10/20, Dr. Cruzita Lederer 12/19/20 . Discussed plans with patient for ongoing care management follow up and provided patient with direct contact information for care management team Patient Goals/Self-Care Activities Over the next 90 days, patient will: Patient will attend all scheduled provider appointments Patient will call provider office for new concerns or questions - check pulse (heart) rate once a day - cut down alcohol use - make a plan to exercise regularly - make a plan to eat healthy - take medicine as prescribed Follow Up Plan: Telephone follow up appointment with care management team member scheduled for: 11/16/20 at [facility]  Care Plan : Diabetes Type 2 (Adult)  Updates made by Dimitri Ped, RN since 10/18/2020 12:00 AM    Problem: RNCM: Lack of effective self management of Diabetes Type 2   Priority: Medium    Long-Range Goal: Effective self management of blood sugars and Diabetes Type 2   Start Date: 10/18/2020  Expected End Date: 01/16/2021  This Visit's Progress: On track  Priority: Medium  Note:   Objective:  Lab Results  Component Value Date    HGBA1C 7.9 (A) 08/21/2020 .   Lab Results  Component Value Date   CREATININE 1.06 10/06/2020   CREATININE 1.02 10/08/2019   CREATININE 1.21 10/26/2018   Current Barriers:  Marland Kitchen Knowledge Deficits related to basic Diabetes pathophysiology and self care/management . Does not adhere to provider recommendations re:  Case Manager Clinical Goal(s):  . patient will demonstrate improved adherence to prescribed treatment plan for diabetes self care/management as evidenced by: daily monitoring and recording of CBG  adherence to ADA/ carb modified diet exercise 3 days/week adherence to prescribed medication regimen Interventions:  . Collaboration with [name], NP regarding development and update of comprehensive plan of care as evidenced by provider attestation and co-signature . Inter-disciplinary care team collaboration (see longitudinal plan of care) . Provided education to patient about basic DM disease process . Reviewed medications with patient and discussed importance of medication adherence . Discussed plans with patient for ongoing care management follow up and provided patient with direct contact information for care management team . Reviewed scheduled/upcoming provider appointments including: Dr. Cruzita Lederer endocrinologist 4/12/221 . Advised patient, providing education and rationale, to check cbg daily at varying times and record, calling provider for findings outside established parameters.   Patient Goals/Self-Care Activities . Over the next 90 days, patient will:  - Self administers oral medications as prescribed Attends all scheduled provider appointments Checks blood sugars as prescribed and utilize hyper and hypoglycemia protocol as needed Adheres to prescribed ADA/carb modified check blood sugar at prescribed times try checking 1 1/2-2 hours after eating - check blood sugar before and after exercise - check blood sugar if I feel it is too high or too low - enter blood sugar  readings and medication or insulin into daily log - take the blood sugar log to all doctor visits - take the blood sugar meter to all doctor visits  Follow Up Plan: Telephone follow up appointment with care management team member scheduled for: 11/16/20 at 9 AM    Plan:Telephone follow up appointment with care management team member scheduled for:  11/16/20 at [place], Jackquline Denmark, CDE Care Management Coordinator [place] [facility] [phone], Mobile [phone]

## 2020-10-24 NOTE — Progress Notes (Signed)
Charles [name] Phone: [phone] Subjective:   Charles [name], am serving as a scribe for Dr. Hulan Saas. This visit occurred during the SARS-CoV-2 public health emergency.  Safety protocols were in place, including screening questions prior to the visit, additional usage of staff PPE, and extensive cleaning of exam room while observing appropriate contact time as indicated for disinfecting solutions.  I'm seeing this patient by the request  of:  Charles Peng, NP  CC: Hip pain follow-up  UDJ:[id number]   09/13/2020 Stable at the moment.  Responding well to the gabapentin.  Do believe it is playing some of the role in the leg pain and back pain.  Responding very well to the 100 mg of gabapentin.  We will keep this time.  Secondary to patient's only having 1 kidney.  Follow-up with me again in 6 weeks  Patient has some mild tightness on the side but overall not significantly tender to palpation.  Discussed with patient in great length.  Patient wants to continue with conservative therapy before we try the injection.  We discussed with patient's A1c at 7.9 would much like patient's blood sugars to be closer to 150 if possible.  Patient will work hard on this.  Follow-up with me again 6 weeks   Update 10/25/2020 Charles [name] is a 80 y.o. male coming in with complaint of left hip and back pain. Patient was told he has A-fib since last visit. Began Eliquis. Next cardiology appt is March 4th. Notes swelling in ankles and calf since going on Eliquis. Has gained 10 pounds in 10 days.   Also having swelling swelling of parotid gland. History of benign tumor.   Is able to now lie on left side somewhat longer due to less pain in the left hip. Has not been doing exercises due to other issues going on with his health.  Patient states he has not been doing as much of the activities secondary to him still feeling a little fatigued with  atrial fibrillation.  Denies though any true chest pain.       Past Medical History:  Diagnosis Date  . Anemia    "when I was a lad"  . Arthritis    "right femur" (10/19/2015)  . AVM (arteriovenous malformation) of colon    2 - non-bleeding 2013  . Biceps muscle tear    right  . Cancer of kidney ([place]) 2000   left nephrectomy  . Coronary artery disease   . Eczema   . GERD (gastroesophageal reflux disease)   . Hernia, ventral   . Hyperlipidemia   . Hypertension   . Obesity   . Personal history of adenomatous colonic polyps 07/20/2012   3 + adenomas 2009 07/20/2012 2 diminutive polyps    . Pneumonia 1942; 1950s X 1  . Type II diabetes mellitus ([place])   . Walking pneumonia 2000's X 1   Past Surgical History:  Procedure Laterality Date  . CARDIAC CATHETERIZATION N/A 10/19/2015   Procedure: Left Heart Cath and Coronary Angiography;  Surgeon: Charolette Forward, MD;  Location: [facility];  Service: Cardiovascular;  Laterality: N/A;  . CARDIAC CATHETERIZATION N/A 10/19/2015   Procedure: Coronary Stent Intervention;  Surgeon: Charolette Forward, MD;  Location: [facility];  Service: Cardiovascular;  Laterality: N/A;  . CATARACT EXTRACTION, BILATERAL Bilateral 01/2018   Dr. Kathrin Penner did surgery and he developed MD and is seeing specialist now for MD treatment  .  COLONOSCOPY     "nothing showed up this time"  . COLONOSCOPY W/ BIOPSIES AND POLYPECTOMY  X 1  . CORONARY ANGIOPLASTY    . FEMUR IM NAIL Right 07/26/2013   Procedure: INTRAMEDULLARY (IM) NAIL FEMORAL subtrochanteric;  Surgeon: Mauri Pole, MD;  Location: [facility];  Service: Orthopedics;  Laterality: Right;  . FRACTURE SURGERY    . INGUINAL HERNIA REPAIR Left >3 times  . INGUINAL HERNIA REPAIR Right 2012  . NEPHRECTOMY Left 2000  . TONSILLECTOMY  1940s   Social History   Socioeconomic History  . Marital status: Widowed    Spouse name: Not on file  . Number of children: 3  . Years of education: Not on file  .  Highest education level: Not on file  Occupational History  . Occupation: retired  Tobacco Use  . Smoking status: Former Smoker    Years: 20.00    Types: Cigarettes, Pipe, Cigars    Quit date: 09/09/1968    Years since quitting: 52.1  . Smokeless tobacco: Never Used  . Tobacco comment: smoking cigars- quit in 13   Substance and Sexual Activity  . Alcohol use: Yes    Alcohol/week: 1.0 standard drink    Types: 1 Cans of beer per week    Comment: occasionally   . Drug use: [name]  . Sexual activity: Never  Other Topics Concern  . 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     widowed    Three children, One in [place], one in [place] one in [place] - 2 : youngest daughter lives with him   Social Determinants of Health   Financial Resource Strain: Low Risk   . Difficulty of Paying Living Expenses: Not hard at all  Food Insecurity: [name] Food Insecurity  . Worried About Charity fundraiser in the Last Year: Never true  . Ran Out of Food in the Last Year: Never true  Transportation Needs: [name] Transportation Needs  . Lack of Transportation (Medical): [name]  . Lack of Transportation (Non-Medical): [name]  Physical Activity: Sufficiently Active  . Days of Exercise per Week: 5 days  . Minutes of Exercise per Session: 50 min  Stress: [name] Stress Concern Present  . Feeling of Stress : Only a little  Social Connections: Moderately Integrated  . Frequency of Communication with Friends and Family: More than three times a week  . Frequency of Social Gatherings with Friends and Family: More than three times a week  . Attends Religious Services: More than 4 times per year  . Active Member of Clubs or Organizations: Yes  . Attends Archivist Meetings: More than 4 times per year  . Marital Status: Widowed   Allergies  Allergen Reactions  . Bee Venom Anaphylaxis    Family History  Problem Relation Age of Onset  . Drug abuse Other   . Cancer Other   . Heart disease Other   . Lung disease Other   . Diabetes Mother   . Diabetes Sister   . Colon cancer Neg Hx   . Stomach cancer Neg Hx     Current Outpatient Medications (Endocrine & Metabolic):  .  glipiZIDE (GLUCOTROL) 5 MG tablet, TAKE 1 TABLET BY MOUTH 2  TIMES DAILY BEFORE A MEAL .  metFORMIN (GLUCOPHAGE) 1000 MG tablet, TAKE 1 TABLET BY MOUTH  TWICE DAILY WITH MEALS  Current Outpatient Medications (Cardiovascular):  .  atorvastatin (LIPITOR) 40 MG tablet, Take 1 tablet (40 mg total) by mouth daily at 6 PM. .  EPINEPHrine (EPI-PEN) 0.3 mg/0.3 mL DEVI, Inject 0.3 mg into the muscle daily as needed (allergic reaction). Marland Kitchen  lisinopril (ZESTRIL) 20 MG tablet, Take 20 mg by mouth daily. .  metoprolol succinate (TOPROL-XL) 100 MG 24 hr tablet, Take 100 mg by mouth daily.  .  nitroGLYCERIN (NITROSTAT) 0.4 MG SL tablet, Place 1 tablet (0.4 mg total) under the tongue every 5 (five) minutes as needed for chest pain.    Current Outpatient Medications (Hematological):  Marland Kitchen  Cyanocobalamin 5000 MCG TBDP, Take 5,000 mcg by mouth every 3 (three) days.  Marland Kitchen  ELIQUIS 5 MG TABS tablet, Take 5 mg by mouth 2 (two) times daily.  Current Outpatient Medications (Other):  .  diclofenac sodium (VOLTAREN) 1 % GEL, Apply 4 g topically 4 (four) times daily. Marland Kitchen  gabapentin (NEURONTIN) 100 MG capsule, Take 1 capsule (100 mg total) by mouth at bedtime. .  Insulin Syringes, Disposable, U-100 0.5 ML MISC, Use 1x a day .  Multiple Vitamins-Minerals (PRESERVISION AREDS 2 PO), Take 1 tablet by mouth daily. Marland Kitchen  neomycin-polymyxin-dexameth (MAXITROL) 0.1 % OINT, Place 1 application into the left eye 2 (two) times daily. Glory Rosebush DELICA LANCETS FINE MISC, Use to check sugar daily .  ONETOUCH VERIO test strip, USE 1 STRIP TO CHECK GLUCOSE ONCE DAILY .  tamsulosin (FLOMAX) 0.4 MG CAPS capsule, TAKE 1 CAPSULE BY MOUTH  DAILY .   vitamin E 400 UNIT capsule, Take 400 Units by mouth daily.   Reviewed prior external information including notes and imaging from  primary care provider As well as notes that were available from care everywhere and other healthcare systems.  Past medical history, social, surgical and family history all reviewed in electronic medical record.  [name] pertanent information unless stated regarding to the chief complaint.   Review of Systems:  [name] headache, visual changes, nausea, vomiting, diarrhea, constipation, dizziness, abdominal pain, skin rash, fevers, chills, night sweats, weight loss, swollen lymph nodes,chest pain, shortness of breath, mood changes. POSITIVE muscle aches, body aches, fatigue, joint swelling  Objective  Blood pressure 126/84, pulse 85, height 5\' 11"  (1.803 m), weight 257 lb (116.6 kg), SpO2 96 %.   General: [name] apparent distress alert and oriented x3 mood and affect normal, dressed appropriately.  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  Measures probably approximately 5 cm.  Hard to palpation.  Minorly tender.  Patient is able to swallow with [name] significant difficulty. Respiratory: Patient's speak in full sentences and does not appear short of breath  Cardiovascular: 1+ lower extremity edema, non tender, [name] erythema  Gait antalgic MSK: Arthritic changes of multiple joints.  Still very mild tenderness noted over the greater trochanteric area.  Patient will has improvement in range of motion of the hip.  Negative straight leg test noted today.    Impression and Recommendations:     The above documentation has been reviewed and is accurate and complete Lyndal Pulley, DO

## 2020-10-25 ENCOUNTER — Ambulatory Visit: Payer: Medicare Other | Admitting: Family Medicine

## 2020-10-25 ENCOUNTER — Encounter: Payer: Self-pay | Admitting: Family Medicine

## 2020-10-25 ENCOUNTER — Other Ambulatory Visit: Payer: Self-pay

## 2020-10-25 VITALS — BP 126/84 | HR 85 | Ht 71.0 in | Wt 257.0 lb

## 2020-10-25 DIAGNOSIS — M2550 Pain in unspecified joint: Secondary | ICD-10-CM

## 2020-10-25 DIAGNOSIS — I4891 Unspecified atrial fibrillation: Secondary | ICD-10-CM | POA: Insufficient documentation

## 2020-10-25 DIAGNOSIS — K118 Other diseases of salivary glands: Secondary | ICD-10-CM | POA: Diagnosis not present

## 2020-10-25 DIAGNOSIS — M7062 Trochanteric bursitis, left hip: Secondary | ICD-10-CM

## 2020-10-25 DIAGNOSIS — I482 Chronic atrial fibrillation, unspecified: Secondary | ICD-10-CM | POA: Diagnosis not present

## 2020-10-25 LAB — COMPREHENSIVE METABOLIC PANEL
ALT: 21 U/L (ref 0–53)
AST: 23 U/L (ref 0–37)
Albumin: 4.1 g/dL (ref 3.5–5.2)
Alkaline Phosphatase: 75 U/L (ref 39–117)
BUN: 16 mg/dL (ref 6–23)
CO2: 30 mEq/L (ref 19–32)
Calcium: 9.1 mg/dL (ref 8.4–10.5)
Chloride: 103 mEq/L (ref 96–112)
Creatinine, Ser: 1.08 mg/dL (ref 0.40–1.50)
GFR: 65.23 mL/min (ref >60.00)
Glucose, Bld: 127 mg/dL — ABNORMAL HIGH (ref 70–99)
Potassium: 4.9 mEq/L (ref 3.5–5.1)
Sodium: 141 mEq/L (ref 135–145)
Total Bilirubin: 0.6 mg/dL (ref 0.2–1.2)
Total Protein: 6.5 g/dL (ref 6.0–8.3)

## 2020-10-25 LAB — CBC WITH DIFFERENTIAL/PLATELET
Basophils Absolute: 0.1 10*3/uL (ref 0.0–0.1)
Basophils Relative: 1.3 % (ref 0.0–3.0)
Eosinophils Absolute: 0.2 10*3/uL (ref 0.0–0.7)
Eosinophils Relative: 4.9 % (ref 0.0–5.0)
HCT: 37.7 % — ABNORMAL LOW (ref 39.0–52.0)
Hemoglobin: 12.1 g/dL — ABNORMAL LOW (ref 13.0–17.0)
Lymphocytes Relative: 17.9 % (ref 12.0–46.0)
Lymphs Abs: 0.8 10*3/uL (ref 0.7–4.0)
MCHC: 32.2 g/dL (ref 30.0–36.0)
MCV: 90 fl (ref 78.0–100.0)
Monocytes Absolute: 0.6 10*3/uL (ref 0.1–1.0)
Monocytes Relative: 12.9 % — ABNORMAL HIGH (ref 3.0–12.0)
Neutro Abs: 2.9 10*3/uL (ref 1.4–7.7)
Neutrophils Relative %: 63 % (ref 43.0–77.0)
Platelets: 208 10*3/uL (ref 150.0–400.0)
RBC: 4.19 Mil/uL — ABNORMAL LOW (ref 4.22–5.81)
RDW: 15.4 % (ref 11.5–15.5)
WBC: 4.6 10*3/uL (ref 4.0–10.5)

## 2020-10-25 LAB — C-REACTIVE PROTEIN: CRP: <1 mg/dL (ref 0.5–20.0)

## 2020-10-25 LAB — SEDIMENTATION RATE: Sed Rate: 13 mm/hr (ref 0–20)

## 2020-10-25 LAB — BRAIN NATRIURETIC PEPTIDE: Pro B Natriuretic peptide (BNP): 156 pg/mL — ABNORMAL HIGH (ref 0.0–100.0)

## 2020-10-25 NOTE — Patient Instructions (Addendum)
Good to see you Labs today If shortness of breat gets worse or association with chest pain see ER immediately or see cardiologist sooner Glad you have an appointment next thursday for the neck. If it enlarges, or any difficulty breathing seek medical attention Try to back off gabapentin Continue exercises  See me again in 2 months

## 2020-10-25 NOTE — Assessment & Plan Note (Signed)
Patient is doing relatively well with the low-dose gabapentin.  Patient is doing well with the exercises the other modalities at the moment.  We can always consider the injection if necessary but patient has many other comorbidities that is contributing at this time.  At this moment will make no changes and patient will follow up with me again in 2 months.

## 2020-10-25 NOTE — Assessment & Plan Note (Addendum)
Recently diagnosed and patient is on Eliquis.  We will get laboratory work-up with patient having some increase in lower extremity swelling to make sure no congestive heart failure.  Patient is following up with cardiology in 2 weeks.  We discussed with patient though if any increasing shortness of breath or chest pain to seek medical attention immediately.  Patient is in agreement with the plan.

## 2020-10-25 NOTE — Assessment & Plan Note (Signed)
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  Patient though has had enlargement over the course of several weeks at this point.  I believe that it is passed 5 cm in size.  Patient denies though any difficulty with breathing or any difficulty with swallowing.  Patient has a follow-up with his physician for this next Thursday.  We discussed that if any worsening symptoms though before that he needs to seek medical attention immediately.  Patient is in agreement with the plan are getting labs including ESR and CRP as well as CBC to further evaluate for any infectious etiology.

## 2020-11-02 DIAGNOSIS — Z7901 Long term (current) use of anticoagulants: Secondary | ICD-10-CM | POA: Diagnosis not present

## 2020-11-02 DIAGNOSIS — D490 Neoplasm of unspecified behavior of digestive system: Secondary | ICD-10-CM | POA: Diagnosis not present

## 2020-11-03 ENCOUNTER — Other Ambulatory Visit: Payer: Self-pay | Admitting: Otolaryngology

## 2020-11-03 ENCOUNTER — Other Ambulatory Visit: Payer: Self-pay | Admitting: Internal Medicine

## 2020-11-03 DIAGNOSIS — E1165 Type 2 diabetes mellitus with hyperglycemia: Secondary | ICD-10-CM

## 2020-11-03 DIAGNOSIS — D490 Neoplasm of unspecified behavior of digestive system: Secondary | ICD-10-CM

## 2020-11-03 DIAGNOSIS — E1159 Type 2 diabetes mellitus with other circulatory complications: Secondary | ICD-10-CM

## 2020-11-09 DIAGNOSIS — E113293 Type 2 diabetes mellitus with mild nonproliferative diabetic retinopathy without macular edema, bilateral: Secondary | ICD-10-CM | POA: Diagnosis not present

## 2020-11-09 DIAGNOSIS — H353133 Nonexudative age-related macular degeneration, bilateral, advanced atrophic without subfoveal involvement: Secondary | ICD-10-CM | POA: Diagnosis not present

## 2020-11-09 DIAGNOSIS — H35372 Puckering of macula, left eye: Secondary | ICD-10-CM | POA: Diagnosis not present

## 2020-11-09 DIAGNOSIS — H34811 Central retinal vein occlusion, right eye, with macular edema: Secondary | ICD-10-CM | POA: Diagnosis not present

## 2020-11-10 DIAGNOSIS — I2510 Atherosclerotic heart disease of native coronary artery without angina pectoris: Secondary | ICD-10-CM | POA: Diagnosis not present

## 2020-11-10 DIAGNOSIS — I480 Paroxysmal atrial fibrillation: Secondary | ICD-10-CM | POA: Diagnosis not present

## 2020-11-10 DIAGNOSIS — E119 Type 2 diabetes mellitus without complications: Secondary | ICD-10-CM | POA: Diagnosis not present

## 2020-11-10 DIAGNOSIS — I10 Essential (primary) hypertension: Secondary | ICD-10-CM | POA: Diagnosis not present

## 2020-11-10 DIAGNOSIS — E785 Hyperlipidemia, unspecified: Secondary | ICD-10-CM | POA: Diagnosis not present

## 2020-11-16 ENCOUNTER — Ambulatory Visit (INDEPENDENT_AMBULATORY_CARE_PROVIDER_SITE_OTHER): Payer: Medicare Other

## 2020-11-16 DIAGNOSIS — I4891 Unspecified atrial fibrillation: Secondary | ICD-10-CM

## 2020-11-16 DIAGNOSIS — I10 Essential (primary) hypertension: Secondary | ICD-10-CM

## 2020-11-16 DIAGNOSIS — E1159 Type 2 diabetes mellitus with other circulatory complications: Secondary | ICD-10-CM

## 2020-11-16 DIAGNOSIS — E1165 Type 2 diabetes mellitus with hyperglycemia: Secondary | ICD-10-CM | POA: Diagnosis not present

## 2020-11-16 NOTE — Chronic Care Management (AMB) (Signed)
Chronic Care Management   CCM RN Visit Note  11/16/2020 Name: Charles [name] MRN: [id number] DOB: [birth year]  Subjective: Charles [name] is a 80 y.o. year old male who is a primary care patient of [name], NP. The care management team was consulted for assistance with disease management and care coordination needs.    Engaged with patient by telephone for follow up visit in response to provider referral for case management and/or care coordination services.   Consent to Services:  The patient was given information about Chronic Care Management services, agreed to services, and gave verbal consent prior to initiation of services.  Please see initial visit note for detailed documentation.   Patient agreed to services and verbal consent obtained.   Assessment: Review of patient past medical history, allergies, medications, health status, including review of consultants reports, laboratory and other test data, was performed as part of comprehensive evaluation and provision of chronic care management services.   SDOH (Social Determinants of Health) assessments and interventions performed:    CCM Care Plan  Allergies  Allergen Reactions  . Bee Venom Anaphylaxis    Outpatient Encounter Medications as of 11/16/2020  Medication Sig Note  . atorvastatin (LIPITOR) 40 MG tablet Take 1 tablet (40 mg total) by mouth daily at 6 PM.   . Cyanocobalamin 5000 MCG TBDP Take 5,000 mcg by mouth every 3 (three) days.    . diclofenac sodium (VOLTAREN) 1 % GEL Apply 4 g topically 4 (four) times daily.   Marland Kitchen ELIQUIS 5 MG TABS tablet Take 5 mg by mouth 2 (two) times daily.   Marland Kitchen EPINEPHrine (EPI-PEN) 0.3 mg/0.3 mL DEVI Inject 0.3 mg into the muscle daily as needed (allergic reaction).   . gabapentin (NEURONTIN) 100 MG capsule Take 1 capsule (100 mg total) by mouth at bedtime.   Marland Kitchen glipiZIDE (GLUCOTROL) 5 MG tablet TAKE 1 TABLET BY MOUTH 2  TIMES DAILY BEFORE A MEAL   . Insulin Syringes, Disposable, U-100 0.5  ML MISC Use 1x a day   . lisinopril (ZESTRIL) 20 MG tablet Take 20 mg by mouth daily.   . metFORMIN (GLUCOPHAGE) 1000 MG tablet TAKE 1 TABLET BY MOUTH  TWICE DAILY WITH MEALS   . metoprolol succinate (TOPROL-XL) 100 MG 24 hr tablet Take 100 mg by mouth daily.  10/18/2020: Increased to 150 mg   . Multiple Vitamins-Minerals (PRESERVISION AREDS 2 PO) Take 1 tablet by mouth daily.   Marland Kitchen neomycin-polymyxin-dexameth (MAXITROL) 0.1 % OINT Place 1 application into the left eye 2 (two) times daily.   . nitroGLYCERIN (NITROSTAT) 0.4 MG SL tablet Place 1 tablet (0.4 mg total) under the tongue every 5 (five) minutes as needed for chest pain.   Glory Rosebush DELICA LANCETS FINE MISC Use to check sugar daily   . ONETOUCH VERIO test strip USE 1 STRIP TO CHECK GLUCOSE ONCE DAILY   . spironolactone (ALDACTONE) 25 MG tablet Take 25 mg by mouth daily.   . tamsulosin (FLOMAX) 0.4 MG CAPS capsule TAKE 1 CAPSULE BY MOUTH  DAILY   . vitamin E 400 UNIT capsule Take 400 Units by mouth daily.    No facility-administered encounter medications on file as of 11/16/2020.    Patient Active Problem List   Diagnosis Date Noted  . Atrial fibrillation ([place]) 10/25/2020  . 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 Epistaxis 09/29/2018  . Hyperkalemia 09/29/2018  . AKI (acute kidney injury) ([place]) 09/29/2018  . Lumbar radiculopathy 08/06/2017  . Greater trochanteric bursitis of left hip 07/04/2017  .  Rotator cuff arthropathy of left shoulder 01/24/2017  . AC (acromioclavicular) arthritis 01/24/2017  . Vitamin B12 deficiency 01/18/2016  . NSTEMI (non-ST elevated myocardial infarction) ([place]) 10/19/2015  . Acute non Q wave MI (myocardial infarction), initial episode of care ([place]) 10/19/2015  . Peripheral neuropathy 10/13/2015  . 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 Actinic keratosis of right cheek 10/04/2014  . Contact dermatitis and eczema 02/10/2014  . 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 Acute blood loss anemia 08/27/2013  . Insomnia 08/27/2013  . Hip fracture, right ([place]) 07/25/2013  . 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 Renal cell carcinoma ([place]) 07/25/2013  . History of colonic polyps 07/20/2012  . Hyperlipidemia 11/13/2007  . Obesity 11/13/2007  . Essential hypertension 04/23/2007    Conditions to be addressed/monitored:Atrial Fibrillation and DMII  Care Plan : RNCM:Atrial Fibrillation (Adult)  Updates made by Dimitri Ped, RN since 11/16/2020 12:00 AM    Problem: Lack of self mangement plan for Atrial Fibrillation   Priority: High    Long-Range Goal: RNCM: Develolp and self manage atrial fibrilation   Start Date: 10/18/2020  Expected End Date: 01/16/2021  This Visit's Progress: On track  Recent Progress: On track  Priority: High  Note:   Current Barriers:  Marland Kitchen Knowledge Deficits related to new dx of atrial fibrillation Reported last seeing cardiologist on 11/10/20.  Started on Spirolactone.  Denies any shortness of breath or chest pain.  Reports taking Eliquis as directed  . Chronic Disease Management support and education needs related to Atrial fibrillation Nurse Case Manager Clinical Goal(s):  Marland Kitchen Over the next 90 days, patient will verbalize understanding of plan for self management of atrial fibrillation . Over the next 90 days, patient will verbalize basic understanding of atrial fibrillation disease process and self health management plan as evidenced by decrease in symptoms, adherence to medications, keeping provider appts  Interventions:  . 1:1 collaboration with [name], NP regarding development and update of comprehensive plan of care as evidenced by provider attestation and co-signature . Inter-disciplinary care team collaboration (see longitudinal plan of care)  . Reinforced patient to call provider for worsening symptoms of atrial fibrillation  . Reviewed education to patient re: atrial fibrillation disease process and atrial  fibrillation action plan  . Reviewed medications with patient and discussed importance of taking medications as ordered to lower heart rate and taking anticoagulant . Provided patient with written and EMMI educational materials related to atrial fibrillation-patient completed EMMI program per records . Reviewed scheduled/upcoming provider appointments including: Dr. Cruzita Lederer 12/19/20, no upcoming primary care appt . Discussed plans with patient for ongoing care management follow up and provided patient with direct contact information for care management team Patient Goals/Self-Care Activities Over the next 90 days, patient will: Patient will attend all scheduled provider appointments Patient will call provider office for new concerns or questions - check pulse (heart) rate once a day - cut down alcohol use - make a plan to exercise regularly - make a plan to eat healthy - take medicine as prescribed -review atrial fibrillation action plan Follow Up Plan: Telephone follow up appointment with care management team member scheduled for: 12/22/20 at [place] : RNCM:Diabetes Type 2 (Adult)  Updates made by Dimitri Ped, RN since 11/16/2020 12:00 AM    Problem: RNCM: Lack of effective self management of Diabetes Type 2   Priority: Medium    Long-Range Goal: Effective self management  of blood sugars and Diabetes Type 2   Start Date: 10/18/2020  Expected End Date: 01/16/2021  This Visit's Progress: On track  Recent Progress: On track  Priority: Medium  Note:   Objective:  Lab Results  Component Value Date   HGBA1C 7.9 (A) 08/21/2020 .   Lab Results  Component Value Date   CREATININE 1.06 10/06/2020   CREATININE 1.02 10/08/2019   CREATININE 1.21 10/26/2018   Current Barriers:  Marland Kitchen Knowledge Deficits related to basic Diabetes pathophysiology and self care/management . Does not adhere to provider recommendations re: Low CHO diet and portion control. Reports sometimes will  have 3 pieces of pizza which will make CBG go up. . Reports blood sugars range 120-140 usually Reports reading of 200 with no reason to think it would be high and had 93 reading later that day. . Reports back to going to gym 5 days a week walking 1-2 miles and working on exercise/weight machines Case Manager Clinical Goal(s):  . patient will demonstrate improved adherence to prescribed treatment plan for diabetes self care/management as evidenced by: daily monitoring and recording of CBG  adherence to ADA/ carb modified diet exercise 3 days/week adherence to prescribed medication regimen Interventions:  . Collaboration with [name], NP regarding development and update of comprehensive plan of care as evidenced by provider attestation and co-signature . Inter-disciplinary care team collaboration (see longitudinal plan of care) . Provided education to patient about basic DM disease process . Reviewed medications with patient and discussed importance of medication adherence . Discussed plans with patient for ongoing care management follow up and provided patient with direct contact information for care management team . Reviewed scheduled/upcoming provider appointments including: Dr. Cruzita Lederer endocrinologist 4/12/221 . Advised patient, providing education and rationale, to check cbg daily at varying times and record, calling provider for findings outside established parameters.   . Instructed to wash hands and recheck his CBG if he gets an unexpected CBG reading . Reviewed importance of getting regular exercise with resistance training at least 2 times a week Patient Goals/Self-Care Activities . Over the next 90 days, patient will:  - Self administers oral medications as prescribed Attends all scheduled provider appointments Checks blood sugars as prescribed and utilize hyper and hypoglycemia protocol as needed Adheres to prescribed ADA/carb modified - check blood sugar at prescribed times  try checking 1 1/2-2 hours after eating -recheck blood sugar if you get an unexpected number - check blood sugar before and after exercise - check blood sugar if I feel it is too high or too low - enter blood sugar readings and medication or insulin into daily log - take the blood sugar log to all doctor visits - take the blood sugar meter to all doctor visits Follow Up Plan: Telephone follow up appointment with care management team member scheduled for: 12/22/20 at 9 AM    Plan:Telephone follow up appointment with care management team member scheduled for:  12/22/20 at 9 AM Peter Garter RN, [facility], CDE Care Management Coordinator [place] [phone], Mobile [phone]

## 2020-11-16 NOTE — Patient Instructions (Addendum)
Visit Information  PATIENT GOALS: Goals Addressed            This Visit's Progress   . RNCM: Better self manage and track my diabetes -Diabetes Type 2   On track    Timeframe:  Long-Range Goal Priority:  Medium Start Date:    10/18/20                       Expected End Date:        01/16/21            Follow Up Date by 12/22/20 at [place]   - check blood sugar at prescribed times try checking 1 1/2-2 hours after eating -recheck blood sugar if you get an unexpected number - check blood sugar before and after exercise - check blood sugar if I feel it is too high or too low - enter blood sugar readings and medication or insulin into daily log - take the blood sugar log to all doctor visits - take the blood sugar meter to all doctor visits    Why is this important?    Checking your blood sugar at home helps to keep it from getting very high or very low.   Writing the results in a diary or log helps the doctor know how to care for you.   Your blood sugar log should have the time, date and the results.   Also, write down the amount of insulin or other medicine that you take.   Other information, like what you ate, exercise done and how you were feeling, will also be helpful.     Notes:     . RNCM: Develop and follow self management plan for Atrial Fibrillation   On track    Timeframe:  Long-Range Goal Priority:  High Start Date:      10/18/20                       Expected End Date:      01/16/21                 Follow Up Date 12/22/20 at 9AM    - check pulse (heart) rate once a day - cut down alcohol use - make a plan to exercise regularly - make a plan to eat healthy - take medicine as prescribed  -review atrial fibrillation action plan    Why is this important?    Atrial fibrillation may have no symptoms. Sometimes the symptoms get worse or happen more often.   It is important to keep track of what your symptoms are and when they happen.   A change in symptoms is  important to discuss with your doctor or nurse.   Being active and healthy eating will also help you manage your heart condition.     Notes:       Blood Glucose Monitoring, Adult Monitoring your blood sugar (glucose) is an important part of managing your diabetes. Blood glucose monitoring involves checking your blood glucose as often as directed and keeping a log or record of your results over time. Checking your blood glucose regularly and keeping a blood glucose log can:  Help you and your health care provider adjust your diabetes management plan as needed, including your medicines or insulin.  Help you understand how food, exercise, illnesses, and medicines affect your blood glucose.  Let you know what your blood glucose is at any time. You can quickly  find out if you have low blood glucose (hypoglycemia) or high blood glucose (hyperglycemia). Your health care provider will set individualized treatment goals for you. Your goals will be based on your age, other medical conditions you have, and how you respond to diabetes treatment. Generally, the goal of treatment is to maintain the following blood glucose levels:  Before meals (preprandial): 80-130 mg/dL (4.4-7.2 mmol/L).  After meals (postprandial): below 180 mg/dL (10 mmol/L).  A1C level: less than 7%. Supplies needed:  Blood glucose meter.  Test strips for your meter. Each meter has its own strips. You must use the strips that came with your meter.  A needle to prick your finger (lancet). Do not use a lancet more than one time.  A device that holds the lancet (lancing device).  A journal or log book to write down your results. How to check your blood glucose Checking your blood glucose 1. Wash your hands for at least 20 seconds with soap and water. 2. Prick the side of your finger (not the tip) with the lancet. Do not use the same finger consecutively. 3. Gently rub the finger until a small drop of blood  appears. 4. Follow instructions that come with your meter for inserting the test strip, applying blood to the strip, and using your blood glucose meter. 5. Write down your result and any notes in your log.   Using alternative sites Some meters allow you to use areas of your body other than your finger (alternative sites) to test your blood. The most common alternative sites are the forearm, the thigh, and the palm of your hand. Alternative sites may not be as accurate as the fingers because blood flow is slower in those areas. This means that the result you get may be delayed, and it may be different from the result that you would get from your finger. Use the finger only, and do not use alternative sites, if:  You think you have hypoglycemia.  You sometimes do not know that your blood glucose is getting low (hypoglycemia unawareness). General tips and recommendations Blood glucose log  Every time you check your blood glucose, write down your result. Also write down any notes about things that may be affecting your blood glucose, such as your diet and exercise for the day. This information can help you and your health care provider: ? Look for patterns in your blood glucose over time. ? Adjust your diabetes management plan as needed.  Check if your meter allows you to download your records to a computer or if there is an app for the meter. Most glucose meters store a record of glucose readings in the meter.   If you have type 1 diabetes:  Check your blood glucose 4 or more times a day if you are on intensive insulin therapy with multiple daily injections (MDI) or if you are using an insulin pump. Check your blood glucose: ? Before every meal and snack. ? Before bedtime.  Also check your blood glucose: ? If you have symptoms of hypoglycemia. ? After treating low blood glucose. ? Before doing activities that create a risk for injury, like driving or using machinery. ? Before and after  exercise. ? Two hours after a meal. ? Occasionally between 2:00 a.m. and 3:00 a.m., as directed.  You may need to check your blood glucose more often, 6-10 times per day, if: ? You have diabetes that is not well controlled. ? You are ill. ? You have a history  of severe hypoglycemia. ? You have hypoglycemia unawareness. If you have type 2 diabetes:  Check your blood glucose 2 or more times a day if you take insulin or other diabetes medicines.  Check your blood glucose 4 or more times a day if you are on intensive insulin therapy. Occasionally, you may also need to check your glucose between 2:00 a.m. and 3:00 a.m., as directed.  Also check your blood glucose: ? Before and after exercise. ? Before doing activities that create a risk for injury, like driving or using machinery.  You may need to check your blood glucose more often if: ? Your medicine is being adjusted. ? Your diabetes is not well controlled. ? You are ill. General tips  Make sure you always have your supplies with you.  After you use a few boxes of test strips, adjust (calibrate) your blood glucose meter by following instructions that came with your meter.  If you have questions or need help, all blood glucose meters have a 24-hour hotline phone number available that you can call. Also contact your health care provider with questions or concerns you may have. Where to find more information  The American Diabetes Association: www.diabetes.org  The Association of Diabetes Care & Education Specialists: www.diabeteseducator.org Contact a health care provider if:  Your blood glucose is at or above 240 mg/dL (13.3 mmol/L) for 2 days in a row.  You have been sick or have had a fever for 2 days or longer, and you are not getting better.  You have any of the following problems for more than 6 hours: ? You cannot eat or drink. ? You have nausea or vomiting. ? You have diarrhea. Get help right away if:  Your blood  glucose is lower than 54 mg/dL (3 mmol/L).  You become confused, or you have trouble thinking clearly.  You have difficulty breathing.  You have moderate or large ketone levels in your urine. These symptoms may represent a serious problem that is an emergency. Do not wait to see if the symptoms will go away. Get medical help right away. Call your local emergency services (911 in the [place] Do not drive yourself to the hospital. Summary  Monitoring your blood glucose is an important part of managing your diabetes.  Blood glucose monitoring involves checking your blood glucose as often as directed and keeping a log or record of your results over time.  Your health care provider will set individualized treatment goals for you. Your goals will be based on your age, other medical conditions you have, and how you respond to diabetes treatment.  Every time you check your blood glucose, write down your result. Also, write down any notes about things that may be affecting your blood glucose, such as your diet and exercise for the day. This information is not intended to replace advice given to you by your health care provider. Make sure you discuss any questions you have with your health care provider. Document Revised: 05/24/2020 Document Reviewed: 05/24/2020 Elsevier Patient Education  2021 [place].  Patient verbalizes understanding of instructions provided today and agrees to view in [place].  Telephone follow up appointment with care management team member scheduled for: 12/22/20 at 74 AM  Peter Garter RN, [facility], CDE Care Management Coordinator [facility] [phone], Mobile [phone]

## 2020-11-17 ENCOUNTER — Other Ambulatory Visit: Payer: Self-pay | Admitting: Family Medicine

## 2020-11-21 ENCOUNTER — Ambulatory Visit
Admission: RE | Admit: 2020-11-21 | Discharge: 2020-11-21 | Disposition: A | Discharge status: Home / Self Care | Payer: Medicare Other | Source: Ambulatory Visit | Attending: Otolaryngology | Admitting: Otolaryngology

## 2020-11-21 ENCOUNTER — Other Ambulatory Visit: Payer: Self-pay

## 2020-11-21 DIAGNOSIS — D490 Neoplasm of unspecified behavior of digestive system: Secondary | ICD-10-CM

## 2020-11-21 DIAGNOSIS — J3489 Other specified disorders of nose and nasal sinuses: Secondary | ICD-10-CM | POA: Diagnosis not present

## 2020-11-21 DIAGNOSIS — I6523 Occlusion and stenosis of bilateral carotid arteries: Secondary | ICD-10-CM | POA: Diagnosis not present

## 2020-11-21 DIAGNOSIS — K118 Other diseases of salivary glands: Secondary | ICD-10-CM | POA: Diagnosis not present

## 2020-11-21 DIAGNOSIS — I672 Cerebral atherosclerosis: Secondary | ICD-10-CM | POA: Diagnosis not present

## 2020-11-21 MED ORDER — IOPAMIDOL (ISOVUE-300) INJECTION 61%
75.0000 mL | Freq: Once | INTRAVENOUS | Status: AC | PRN
  Administered 2020-11-21: 75 mL via INTRAVENOUS

## 2020-11-27 DIAGNOSIS — D378 Neoplasm of uncertain behavior of other specified digestive organs: Secondary | ICD-10-CM | POA: Diagnosis not present

## 2020-11-27 DIAGNOSIS — R221 Localized swelling, mass and lump, neck: Secondary | ICD-10-CM | POA: Diagnosis not present

## 2020-11-29 ENCOUNTER — Telehealth: Payer: Self-pay | Admitting: Pharmacist

## 2020-11-29 NOTE — Chronic Care Management (AMB) (Signed)
Chronic Care Management Pharmacy Assistant   Name: Charles [name]  MRN: [id number] DOB: November 09, 1940   Reason for Encounter: Diabetes Adherence Call    Recent office visits:  11/16/20-Sandlin, Marland Kitchen, RN (CCM). 10/18/20-Sandlin, Marland Kitchen, RN (OV). 10/06/20-Nafziger, Tommi Rumps, NP (OV).  Recent consult visits:  11/02/20-Bates, Remus Loffler, MD (OV). 10/25/20-Smith, Pixie Casino, DO (OV).  Hospital visits:  None in previous 6 months   Medication changes indicated: 10/06/20-Cyclobenzaprine HCI 10 MG (disc), Insulin NPH Human (Isophane) 100 Unit/ML (disc), Saline 0.65 % (disc), Semaglutide 0.5 MG (disc).    Medications: Outpatient Encounter Medications as of 11/29/2020  Medication Sig Note  . atorvastatin (LIPITOR) 40 MG tablet Take 1 tablet (40 mg total) by mouth daily at 6 PM.   . Cyanocobalamin 5000 MCG TBDP Take 5,000 mcg by mouth every 3 (three) days.    . diclofenac sodium (VOLTAREN) 1 % GEL Apply 4 g topically 4 (four) times daily.   Marland Kitchen ELIQUIS 5 MG TABS tablet Take 5 mg by mouth 2 (two) times daily.   Marland Kitchen EPINEPHrine (EPI-PEN) 0.3 mg/0.3 mL DEVI Inject 0.3 mg into the muscle daily as needed (allergic reaction).   . gabapentin (NEURONTIN) 100 MG capsule TAKE 1 CAPSULE BY MOUTH AT  BEDTIME   . glipiZIDE (GLUCOTROL) 5 MG tablet TAKE 1 TABLET BY MOUTH 2  TIMES DAILY BEFORE A MEAL   . Insulin Syringes, Disposable, U-100 0.5 ML MISC Use 1x a day   . lisinopril (ZESTRIL) 20 MG tablet Take 20 mg by mouth daily.   . metFORMIN (GLUCOPHAGE) 1000 MG tablet TAKE 1 TABLET BY MOUTH  TWICE DAILY WITH MEALS   . metoprolol succinate (TOPROL-XL) 100 MG 24 hr tablet Take 100 mg by mouth daily.  10/18/2020: Increased to 150 mg   . Multiple Vitamins-Minerals (PRESERVISION AREDS 2 PO) Take 1 tablet by mouth daily.   Marland Kitchen neomycin-polymyxin-dexameth (MAXITROL) 0.1 % OINT Place 1 application into the left eye 2 (two) times daily.   . nitroGLYCERIN (NITROSTAT) 0.4 MG SL tablet Place 1 tablet (0.4 mg total) under  the tongue every 5 (five) minutes as needed for chest pain.   Glory Rosebush DELICA LANCETS FINE MISC Use to check sugar daily   . ONETOUCH VERIO test strip USE 1 STRIP TO CHECK GLUCOSE ONCE DAILY   . spironolactone (ALDACTONE) 25 MG tablet Take 25 mg by mouth daily.   . tamsulosin (FLOMAX) 0.4 MG CAPS capsule TAKE 1 CAPSULE BY MOUTH  DAILY   . vitamin E 400 UNIT capsule Take 400 Units by mouth daily.    No facility-administered encounter medications on file as of 11/29/2020.    Recent Relevant Labs: Lab Results  Component Value Date/Time   HGBA1C 7.9 (A) 08/21/2020 08:38 AM   HGBA1C 7.3 (A) 05/03/2020 10:52 AM   HGBA1C 7.9 (H) 09/29/2018 02:23 AM   HGBA1C 6.9 (H) 08/08/2016 08:56 AM   MICROALBUR 13.8 (H) 08/08/2016 08:56 AM   MICROALBUR 11.4 (H) 07/21/2015 08:37 AM    Kidney Function Lab Results  Component Value Date/Time   CREATININE 1.08 10/25/2020 10:19 AM   CREATININE 1.06 10/06/2020 09:40 AM   CREATININE 1.21 (H) 02/18/2017 08:30 AM   GFR 65.23 10/25/2020 10:19 AM   GFRNONAA 46 (L) 09/29/2018 10:14 AM   GFRNONAA 58 (L) 02/18/2017 08:30 AM   GFRAA 53 (L) 09/29/2018 10:14 AM   GFRAA 67 02/18/2017 08:30 AM    . Current antihyperglycemic regimen:   Glipizide 5mg , 1 tablet twice daily with a meal  Metformin 1000mg , 1 tablet twice daily with meals   . What recent interventions/DTPs have been made to improve glycemic control:  o Recent interventions was to check blood sugar as instructed document, and provide at future appointments ? Work on remembering to taking evening doses of glipizide and metformin and consider the use of an alarm to help as a reminder ? Fill half of your plate with nonstarchy vegetables, such as spinach, broccoli, carrots and tomatoes. ? Fill a quarter of your plate with a protein, such as tuna, lean pork or chicken. ? Fill the last quarter with a whole-grain item, such as brown rice, or a starchy vegetable, such as green peas or potatoes. ? Include  "good" fats such as nuts or avocados in small amounts.  . Have there been any recent hospitalizations or ED visits since last visit with CPP? No   . Patient denies hypoglycemic symptoms, including Pale, Hungry, Nervous/irritable and Vision changes   . Patient denies hyperglycemic symptoms, including blurry vision, excessive thirst, fatigue and weakness    . How often are you checking your blood sugar? once daily every week in the morning before eating eating breakfast.  . What are your blood sugars ranging?  o Fasting: 150, 162, 108,148, 137, 168, 146, 115 o Before meals: None o After meals: None o Bedtime: None  . During the week, how often does your blood glucose drop below 70? Patient reports it has several years ago, went in the house and checked his sugar. Following that he would just eat something sweet to eat.   . Are you checking your feet daily/regularly? Patient is checking feet daily.  Adherence Review: Is the patient currently on a STATIN medication? Yes   Is the patient currently on ACE/ARB medication? Yes   Does the patient have >5 day gap between last estimated fill dates? No   Star Rating Drugs: Glipizide 5 MG tablet: 90 DS, last filled on 11/08/20 at [facility]. Atorvastatin 40 MG tablet: 90 DS, last filled on 11/03/20 at [facility]. Lisinopril 20 MG tablet: 90 DS, Last filled on 11/08/20 at [facility].  Patient reports his blood sugars normally range between 130-150, occasionally will get a elevated reading outside of that range. Patient reports it has elevated over 150 two or three weeks ago, ranging at 206. Patient stated he rechecked his blood sugar later on in the day and it lowered down to 98. Patient voiced when its running under 100, he reports feeling nauseated, shaky, and sweaty. Patient reports having frequent urination but unsure if its related to his blood sugars elevating. Patient verbalized he takes tamsulosin 0.4 MG capsule to control his bladder.    Patient reports he is good with all his medications, no refills requested at this time.  Patient aware he has a CCM Call follow-up on 01/12/21 at 8:30 AM with Jeni Salles, CPP.  Jeni Salles, CPP Notified.  Raynelle Highland, [place] Pharmacist Assistant [phone] CCM Total Time:65 minutes

## 2020-12-19 ENCOUNTER — Ambulatory Visit: Payer: Medicare Other | Admitting: Internal Medicine

## 2020-12-19 ENCOUNTER — Encounter: Payer: Self-pay | Admitting: Internal Medicine

## 2020-12-19 ENCOUNTER — Other Ambulatory Visit: Payer: Self-pay

## 2020-12-19 VITALS — BP 128/88 | HR 76 | Ht 71.0 in | Wt 250.4 lb

## 2020-12-19 DIAGNOSIS — E1165 Type 2 diabetes mellitus with hyperglycemia: Secondary | ICD-10-CM | POA: Diagnosis not present

## 2020-12-19 DIAGNOSIS — E785 Hyperlipidemia, unspecified: Secondary | ICD-10-CM

## 2020-12-19 DIAGNOSIS — G6289 Other specified polyneuropathies: Secondary | ICD-10-CM

## 2020-12-19 DIAGNOSIS — E538 Deficiency of other specified B group vitamins: Secondary | ICD-10-CM

## 2020-12-19 DIAGNOSIS — E1159 Type 2 diabetes mellitus with other circulatory complications: Secondary | ICD-10-CM

## 2020-12-19 LAB — POCT GLYCOSYLATED HEMOGLOBIN (HGB A1C): Hemoglobin A1C: 7.3 % — AB (ref 4.0–5.6)

## 2020-12-19 NOTE — Patient Instructions (Addendum)
Please continue: - Metformin 1000 mg 2x a day with meals - Glipizide 10 mg 2x a day before meals  Please check some sugars later in the day, also.  Please return in 4 months with your sugar log.

## 2020-12-19 NOTE — Progress Notes (Signed)
Patient ID: Charles [name], male   DOB: [birth year], 80 y.o.   MRN: [id number]  This visit occurred during the SARS-CoV-2 public health emergency.  Safety protocols were in place, including screening questions prior to the visit, additional usage of staff PPE, and extensive cleaning of exam room while observing appropriate contact time as indicated for disinfecting solutions.   HPI: Charles [name] is a 80 y.o.-year-old male, returning for f/u for DM2 dx 2008, uncontrolled, with complications (CAD - s/p NSTEMI, PN, mild CKD). Last visit 4 months ago.  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 He had an episode of Afib.On Eliquis now.  He gained 16 pounds of fluid after this episode, but lost them afterwards.   DM2: Reviewed HbA1c levels: Lab Results  Component Value Date   HGBA1C 7.9 (A) 08/21/2020   HGBA1C 7.3 (A) 05/03/2020   HGBA1C 7.4 (A) 12/22/2019  12/2013: 8.6%  Pt is on a regimen of: - Metformin 1000 mg 2x a day >> 2000 mg with dinner (but sugars worse) >> 1000 mg 2x a day - Glipizide 10 mg 2x a day before meals  He did not start Ozempic 0.25 mg weekly -08/2020 >> too expensive. In the past, I recommended  NPH 14 units at bedtime, but he did not start. We tried Rybelsus 3 >> 6 mg daily-added 12/2018 >> $$$ >> stopped 01/2019 He checked with his insurance and none of the SGLT2 inhibitors are covered for him.  Pt checks his sugars once a day: - am: 112, 120-220, 233 >> 112-160s, few 200s (pizza) >> 114, 122-218 >> 88-124 - 2h after b'fast: 68x1 >> n/c >> 201, 206 >> n/c - before lunch:  n/c >> 69, 79 >> n/c >> 90 x1 >> n/c - 2h after lunch: 123-176 >> n/c - before dinner  n/c >> 160s >> n/c - 2h after dinner: n/c >> 240 >> n/c - bedtime: 110-150 >> 160s >>  n/c Lowest sugar was  71 >> 88; he has hypoglycemia awareness in the 80s. Highest sugar was 218 >> 120s.  -+ Mild CKD, last BUN/creatinine:  Lab Results  Component Value Date   BUN 16 10/25/2020   CREATININE 1.08 10/25/2020  Of note, he has a history of nephrectomy. On lisinopril 10.  -+ HL; last set of lipids: Lab Results  Component Value Date   CHOL 121 10/06/2020   HDL 46.20 10/06/2020   LDLCALC 56 10/06/2020   LDLDIRECT 139.8 07/08/2014   TRIG 92.0 10/06/2020   CHOLHDL 3 10/06/2020  On Lipitor 40. - last eye exam was in 05/21: No DR; now seeing Dr. [place] - retinal vein occlusion, + cataracts, + macular "hole".  He has a history of eye surgeries.  He gets intraocular injections. -She is numbness and tingling in feet improved after starting B12  B12 deficiency: -Diagnosed during investigation for numbness and tingling (peripheral neuropathy) -He was initially on B12 injections, then on 5000 mcg daily -She is on 5000 mcg every 3 days, changed by PCP 09/2019.  He continues on this dose.  Reviewed his B12 levels: Lab Results  Component Value Date   VITAMINB12 900 10/06/2020   VITAMINB12 748 12/22/2019   VITAMINB12 1,033 (H) 10/08/2019   VITAMINB12 1,172 (H) 08/19/2018   VITAMINB12 >1500 (H) 05/04/2018  [id number] 1,249 (H) 02/18/2017   VITAMINB12 234 10/22/2016   VITAMINB12 217 04/19/2016   VITAMINB12 94 (L) 10/13/2015   He was admitted with NSTEMI 10/18/2015.  He had stents placed then.  He was going to the gym 5 out of 7 days, but stopped during the coronavirus pandemic.  He then restarted to go to the gym.  On tamsulosin.  ROS: + See HPI Constitutional: no weight gain/no weight loss, no fatigue, no subjective hyperthermia, no subjective hypothermia Eyes: no blurry vision, no xerophthalmia ENT: no sore throat, no nodules palpated in neck, no dysphagia, no odynophagia, no hoarseness Cardiovascular: no CP/no SOB/no palpitations/no leg swelling Respiratory: no  cough/no SOB/no wheezing Gastrointestinal: no N/no V/no D/no C/no acid reflux Musculoskeletal: no muscle aches/no joint aches Skin: no rashes, no hair loss Neurological: no tremors/no numbness/no tingling/no dizziness  I reviewed pt's medications, allergies, PMH, social hx, family hx, and changes were documented in the history of present illness. Otherwise, unchanged from my initial visit note.  Past Medical History:  Diagnosis Date  . Anemia    "when I was a lad"  . Arthritis    "right femur" (10/19/2015)  . AVM (arteriovenous malformation) of colon    2 - non-bleeding 2013  . Biceps muscle tear    right  . Cancer of kidney ([place]) 2000   left nephrectomy  . Coronary artery disease   . Eczema   . GERD (gastroesophageal reflux disease)   . Hernia, ventral   . Hyperlipidemia   . Hypertension   . Obesity   . Personal history of adenomatous colonic polyps 07/20/2012   3 + adenomas 2009 07/20/2012 2 diminutive polyps    . Pneumonia 1942; 1950s X 1  . Type II diabetes mellitus ([place])   . Walking pneumonia 2000's X 1   Past Surgical History:  Procedure Laterality Date  . CARDIAC CATHETERIZATION N/A 10/19/2015   Procedure: Left Heart Cath and Coronary Angiography;  Surgeon: Charolette Forward, MD;  Location: [facility];  Service: Cardiovascular;  Laterality: N/A;  . CARDIAC CATHETERIZATION N/A 10/19/2015   Procedure: Coronary Stent Intervention;  Surgeon: Charolette Forward, MD;  Location: [facility];  Service: Cardiovascular;  Laterality: N/A;  . CATARACT EXTRACTION, BILATERAL Bilateral 01/2018   Dr. Kathrin Penner did surgery and he developed MD and is seeing specialist now for MD treatment  . COLONOSCOPY     "nothing showed up this time"  . COLONOSCOPY W/ BIOPSIES AND POLYPECTOMY  X 1  . CORONARY ANGIOPLASTY    . FEMUR IM NAIL Right 07/26/2013   Procedure: INTRAMEDULLARY (IM) NAIL FEMORAL subtrochanteric;  Surgeon: Mauri Pole, MD;  Location: [facility];  Service: Orthopedics;   Laterality: Right;  . FRACTURE SURGERY    . INGUINAL HERNIA REPAIR Left >3 times  . INGUINAL HERNIA REPAIR Right 2012  . NEPHRECTOMY Left 2000  . TONSILLECTOMY  1940s   Social History   Socioeconomic History  . Marital status: Widowed    Spouse name: Not on file  . Number of children: 3  . Years of education: Not on file  . Highest education level: Not on file  Occupational History  . Occupation: retired  Tobacco Use  . Smoking status: Former Smoker    Years: 20.00    Types: Cigarettes, Pipe, Cigars    Quit date: 09/09/1968    Years since quitting: 52.3  . Smokeless tobacco: Never Used  . Tobacco comment: smoking cigars- quit in 13   Substance and Sexual Activity  .  Alcohol use: Yes    Alcohol/week: 1.0 standard drink    Types: 1 Cans of beer per week    Comment: occasionally   . Drug use: No  . Sexual activity: Never  Other Topics Concern  . 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     widowed    Three children, One in [place], one in [place] one in [place] - 2 : youngest daughter lives with him   Social Determinants of Health   Financial Resource Strain: Low Risk   . Difficulty of Paying Living Expenses: Not hard at all  Food Insecurity: No Food Insecurity  . Worried About Charity fundraiser in the Last Year: Never true  . Ran Out of Food in the Last Year: Never true  Transportation Needs: No Transportation Needs  . Lack of Transportation (Medical): No  . Lack of Transportation (Non-Medical): No  Physical Activity: Sufficiently Active  . Days of Exercise per Week: 5 days  . Minutes of Exercise per Session: 50 min  Stress: No Stress Concern Present  . Feeling of Stress : Only a little  Social Connections: Moderately Integrated  . Frequency of Communication with Friends and Family: More than three times a week  . Frequency of  Social Gatherings with Friends and Family: More than three times a week  . Attends Religious Services: More than 4 times per year  . Active Member of Clubs or Organizations: Yes  . Attends Archivist Meetings: More than 4 times per year  . Marital Status: Widowed  Intimate Partner Violence: Not At Risk  . Fear of Current or Ex-Partner: No  . Emotionally Abused: No  . Physically Abused: No  . Sexually Abused: No   Current Outpatient Medications on File Prior to Visit  Medication Sig Dispense Refill  . atorvastatin (LIPITOR) 40 MG tablet Take 1 tablet (40 mg total) by mouth daily at 6 PM. 30 tablet 3  . Cyanocobalamin 5000 MCG TBDP Take 5,000 mcg by mouth every 3 (three) days.     . diclofenac sodium (VOLTAREN) 1 % GEL Apply 4 g topically 4 (four) times daily. 150 g 1  . ELIQUIS 5 MG TABS tablet Take 5 mg by mouth 2 (two) times daily.    Marland Kitchen EPINEPHrine (EPI-PEN) 0.3 mg/0.3 mL DEVI Inject 0.3 mg into the muscle daily as needed (allergic reaction).    . gabapentin (NEURONTIN) 100 MG capsule TAKE 1 CAPSULE BY MOUTH AT  BEDTIME 90 capsule 3  . glipiZIDE (GLUCOTROL) 5 MG tablet TAKE 1 TABLET BY MOUTH 2  TIMES DAILY BEFORE A MEAL 180 tablet 3  . Insulin Syringes, Disposable, U-100 0.5 ML MISC Use 1x a day 100 each 3  . lisinopril (ZESTRIL) 20 MG tablet Take 20 mg by mouth daily.    . metFORMIN (GLUCOPHAGE) 1000 MG tablet TAKE 1 TABLET BY MOUTH  TWICE DAILY WITH MEALS 180 tablet 3  . metoprolol succinate (TOPROL-XL) 100 MG 24 hr tablet Take 100 mg by mouth daily.     . Multiple Vitamins-Minerals (PRESERVISION AREDS 2 PO) Take 1 tablet by mouth daily.    Marland Kitchen neomycin-polymyxin-dexameth (MAXITROL) 0.1 % OINT Place 1 application into the left eye 2 (two) times daily.    . nitroGLYCERIN (NITROSTAT) 0.4 MG SL tablet Place 1 tablet (0.4 mg total) under the  tongue every 5 (five) minutes as needed for chest pain. 25 tablet 1  . ONETOUCH DELICA LANCETS FINE MISC Use to check sugar daily 100 each 5   . ONETOUCH VERIO test strip USE 1 STRIP TO CHECK GLUCOSE ONCE DAILY 100 each 3  . spironolactone (ALDACTONE) 25 MG tablet Take 25 mg by mouth daily.    . tamsulosin (FLOMAX) 0.4 MG CAPS capsule TAKE 1 CAPSULE BY MOUTH  DAILY 90 capsule 3  . vitamin E 400 UNIT capsule Take 400 Units by mouth daily.     No current facility-administered medications on file prior to visit.   Allergies  Allergen Reactions  . Bee Venom Anaphylaxis   Family History  Problem Relation Age of Onset  . Drug abuse Other   . Cancer Other   . Heart disease Other   . Lung disease Other   . Diabetes Mother   . Diabetes Sister   . Colon cancer Neg Hx   . Stomach cancer Neg Hx     PE: BP 128/88 (BP Location: Right Arm, Patient Position: Sitting, Cuff Size: Normal)   Pulse 76   Ht 5\' 11"  (1.803 m)   Wt 250 lb 6.4 oz (113.6 kg)   SpO2 98%   BMI 34.92 kg/m  Wt Readings from Last 3 Encounters:  12/19/20 250 lb 6.4 oz (113.6 kg)  10/25/20 257 lb (116.6 kg)  10/06/20 255 lb (115.7 kg)   Constitutional: overweight, in NAD Eyes: PERRLA, EOMI, no exophthalmos ENT: moist mucous membranes, no thyromegaly, no cervical lymphadenopathy Cardiovascular: RRR, No MRG, +R>L LE swelling Respiratory: CTA B Gastrointestinal: abdomen soft, NT, ND, BS+ Musculoskeletal: no deformities, strength intact in all 4 Skin: moist, warm, no rashes Neurological: no tremor with outstretched hands, DTR normal in all 4  ASSESSMENT: 1. DM2, insulin-dependent but not on insulin, uncontrolled, with complications - CAD - s/p NSTEMI (Dr. Terrence Dupont) - peripheral neuropathy - CKD  2. B12 def  3.  Peripheral neuropathy  4. HL  PLAN:  1. Patient with longstanding, uncontrolled, type 2 diabetes, with improved control earlier last year, but worsened control at last visit.  At that time, he relaxed his diet around the holidays and also starting gabapentin, which he felt was increasing his CBGs-he had hyperglycemia up to 200s in the morning.   I advised him to add a GLP-1 receptor agonist.  This was expensive for him and I referred him to [facility] to help with getting the medication.  However, he could not obtain it.  He did start to improve his diet afterwards and started back on exercise. -At this visit, sugars are well controlled in the morning, much improved from before, but unfortunately is not checking sugars later in the day.  I strongly advised him to start doing so. -For now, we will continue the same dose of Metformin and glipizide.  However, he is preparing for parotid gland excision and we discussed that he may not be able to eat well afterwards.  In that case, will need to decrease the glipizide doses and possibly even stop.  I advised him to get in touch with me for further instructions if needed after the surgery. - I advised him to: Patient Instructions  Please continue: - Metformin 1000 mg 2x a day with meals - Glipizide 10 mg 2x a day before meals  Please check some sugars later in the day, also.  Please return in 3-4 months with your sugar log.  - we checked his HbA1c: 7.3% (better) -  advised to check sugars at different times of the day - 1x a day, rotating check times - advised for yearly eye exams >> he is UTD - return to clinic in 4 months     2.  And 3. Peripheral neuropathy and B12 deficiency -His peripheral neuropathy is probably related to diabetes and B12 deficiency -We initially started him on intramuscular injections of B12, then switched to 5000 mcg B12 daily, which we then decrease gradually to every 3 days. -He continues with 5000 mcg every 3 days.  I advised him to switch to 1000 mg daily after he runs out of the high-dose tablet. -Latest B12 level was at goal in 09/2020  4. HL -Reviewed latest lipid panel from 09/2020: All fractions at goal: Lab Results  Component Value Date   CHOL 121 10/06/2020   HDL 46.20 10/06/2020   LDLCALC 56 10/06/2020   LDLDIRECT 139.8 07/08/2014   TRIG 92.0 10/06/2020    CHOLHDL 3 10/06/2020  -Continues Lipitor 40 mg daily without side effects   Philemon Kingdom, MD PhD [facility]

## 2020-12-19 NOTE — Addendum Note (Signed)
Addended by: Lauralyn Primes on: 12/19/2020 11:20 AM   Modules accepted: Orders

## 2020-12-20 ENCOUNTER — Ambulatory Visit (INDEPENDENT_AMBULATORY_CARE_PROVIDER_SITE_OTHER): Payer: Medicare Other

## 2020-12-20 DIAGNOSIS — Z0000 Encounter for general adult medical examination without abnormal findings: Secondary | ICD-10-CM | POA: Diagnosis not present

## 2020-12-20 NOTE — Progress Notes (Signed)
Virtual Visit via Telephone Note  I connected with  Charles [name] on 12/20/20 at  8:45 AM EDT by telephone and verified that I am speaking with the correct person using two identifiers.  Location: Patient: Home Provider: Office Persons participating in the virtual visit: patient/Nurse Health Advisor   I discussed the limitations, risks, security and privacy concerns of performing an evaluation and management service by telephone and the availability of in person appointments. The patient expressed understanding and agreed to proceed.  Interactive audio and video telecommunications were attempted between this nurse and patient, however failed, due to patient having technical difficulties OR patient did not have access to video capability.  We continued and completed visit with audio only.  Some vital signs may be absent or patient reported.   Willette Brace, LPN    Subjective:   Charles [name] is a 80 y.o. male who presents for Medicare Annual/Subsequent preventive examination.  Review of Systems     Cardiac Risk Factors include: advanced age (>41men, >61 women);diabetes mellitus;male gender;hypertension;dyslipidemia;obesity (BMI >30kg/m2)     Objective:    There were no vitals filed for this visit. There is no height or weight on file to calculate BMI.  Advanced Directives 12/20/2020 10/18/2020 08/25/2020 10/12/2019 09/28/2018 06/09/2018 06/06/2017  Does Patient Have a Medical Advance Directive? Yes Yes Yes Yes No Yes Yes  Type of Paramedic of [place];Living will Healthcare Power of [place];Living will - - -  Does patient want to make changes to medical advance directive? - No - Patient declined No - Patient declined No - Patient declined - - -  Copy of [place] in Chart? No - copy requested No - copy requested - No - copy requested - - -  Would patient like information on creating a  medical advance directive? - - - - No - Patient declined - -  Pre-existing out of facility DNR order (yellow form or pink MOST form) - - - - - - -    Current Medications (verified) Outpatient Encounter Medications as of 12/20/2020  Medication Sig  . atorvastatin (LIPITOR) 40 MG tablet Take 1 tablet (40 mg total) by mouth daily at 6 PM.  . Cyanocobalamin 5000 MCG TBDP Take 5,000 mcg by mouth every 3 (three) days.   Marland Kitchen ELIQUIS 5 MG TABS tablet Take 5 mg by mouth 2 (two) times daily.  Marland Kitchen EPINEPHrine (EPI-PEN) 0.3 mg/0.3 mL DEVI Inject 0.3 mg into the muscle daily as needed (allergic reaction).  . gabapentin (NEURONTIN) 100 MG capsule TAKE 1 CAPSULE BY MOUTH AT  BEDTIME  . glipiZIDE (GLUCOTROL) 5 MG tablet TAKE 1 TABLET BY MOUTH 2  TIMES DAILY BEFORE A MEAL  . Insulin Syringes, Disposable, U-100 0.5 ML MISC Use 1x a day  . lisinopril (ZESTRIL) 20 MG tablet Take 20 mg by mouth daily.  . metFORMIN (GLUCOPHAGE) 1000 MG tablet TAKE 1 TABLET BY MOUTH  TWICE DAILY WITH MEALS  . metoprolol succinate (TOPROL-XL) 100 MG 24 hr tablet Take 100 mg by mouth daily.   . Multiple Vitamins-Minerals (PRESERVISION AREDS 2 PO) Take 1 tablet by mouth daily.  Glory Rosebush DELICA LANCETS FINE MISC Use to check sugar daily  . ONETOUCH VERIO test strip USE 1 STRIP TO CHECK GLUCOSE ONCE DAILY  . tamsulosin (FLOMAX) 0.4 MG CAPS capsule TAKE 1 CAPSULE BY MOUTH  DAILY  . nitroGLYCERIN (NITROSTAT) 0.4 MG SL tablet Place 1 tablet (0.4  mg total) under the tongue every 5 (five) minutes as needed for chest pain. (Patient not taking: Reported on 12/20/2020)  . spironolactone (ALDACTONE) 25 MG tablet Take 25 mg by mouth daily. (Patient not taking: Reported on 12/20/2020)  . [DISCONTINUED] diclofenac sodium (VOLTAREN) 1 % GEL Apply 4 g topically 4 (four) times daily. (Patient not taking: Reported on 12/20/2020)  . [DISCONTINUED] neomycin-polymyxin-dexameth (MAXITROL) 0.1 % OINT Place 1 application into the left eye 2 (two) times daily.  (Patient not taking: Reported on 12/20/2020)  . [DISCONTINUED] vitamin E 400 UNIT capsule Take 400 Units by mouth daily. (Patient not taking: Reported on 12/20/2020)   No facility-administered encounter medications on file as of 12/20/2020.    Allergies (verified) Bee venom   History: Past Medical History:  Diagnosis Date  . Anemia    "when I was a lad"  . Arthritis    "right femur" (10/19/2015)  . AVM (arteriovenous malformation) of colon    2 - non-bleeding 2013  . Biceps muscle tear    right  . Cancer of kidney ([place]) 2000   left nephrectomy  . Coronary artery disease   . Eczema   . GERD (gastroesophageal reflux disease)   . Hernia, ventral   . Hyperlipidemia   . Hypertension   . Obesity   . Personal history of adenomatous colonic polyps 07/20/2012   3 + adenomas 2009 07/20/2012 2 diminutive polyps    . Pneumonia 1942; 1950s X 1  . Type II diabetes mellitus ([place])   . Walking pneumonia 2000's X 1   Past Surgical History:  Procedure Laterality Date  . CARDIAC CATHETERIZATION N/A 10/19/2015   Procedure: Left Heart Cath and Coronary Angiography;  Surgeon: Charolette Forward, MD;  Location: [facility];  Service: Cardiovascular;  Laterality: N/A;  . CARDIAC CATHETERIZATION N/A 10/19/2015   Procedure: Coronary Stent Intervention;  Surgeon: Charolette Forward, MD;  Location: [facility];  Service: Cardiovascular;  Laterality: N/A;  . CATARACT EXTRACTION, BILATERAL Bilateral 01/2018   Dr. Kathrin Penner did surgery and he developed MD and is seeing specialist now for MD treatment  . COLONOSCOPY     "nothing showed up this time"  . COLONOSCOPY W/ BIOPSIES AND POLYPECTOMY  X 1  . CORONARY ANGIOPLASTY    . FEMUR IM NAIL Right 07/26/2013   Procedure: INTRAMEDULLARY (IM) NAIL FEMORAL subtrochanteric;  Surgeon: Mauri Pole, MD;  Location: [facility];  Service: Orthopedics;  Laterality: Right;  . FRACTURE SURGERY    . INGUINAL HERNIA REPAIR Left >3 times  . INGUINAL HERNIA REPAIR Right  2012  . NEPHRECTOMY Left 2000  . TONSILLECTOMY  1940s   Family History  Problem Relation Age of Onset  . Drug abuse Other   . Cancer Other   . Heart disease Other   . Lung disease Other   . Diabetes Mother   . Diabetes Sister   . Colon cancer Neg Hx   . Stomach cancer Neg Hx    Social History   Socioeconomic History  . Marital status: Widowed    Spouse name: Not on file  . Number of children: 3  . Years of education: Not on file  . Highest education level: Not on file  Occupational History  . Occupation: retired  Tobacco Use  . Smoking status: Former Smoker    Years: 20.00    Types: Cigarettes, Pipe, Cigars    Quit date: 09/09/1968    Years since quitting: 52.3  . Smokeless tobacco: Never Used  .  Tobacco comment: smoking cigars- quit in 13   Substance and Sexual Activity  . Alcohol use: Yes    Alcohol/week: 1.0 standard drink    Types: 1 Cans of beer per week    Comment: occasionally   . Drug use: No  . Sexual activity: Never  Other Topics Concern  . 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     widowed    Three children, One in [place], one in [place] one in [place] - 2 : youngest daughter lives with him   Social Determinants of Health   Financial Resource Strain: Low Risk   . Difficulty of Paying Living Expenses: Not hard at all  Food Insecurity: No Food Insecurity  . Worried About Charity fundraiser in the Last Year: Never true  . Ran Out of Food in the Last Year: Never true  Transportation Needs: No Transportation Needs  . Lack of Transportation (Medical): No  . Lack of Transportation (Non-Medical): No  Physical Activity: Sufficiently Active  . Days of Exercise per Week: 5 days  . Minutes of Exercise per Session: 120 min  Stress: No Stress Concern Present  . Feeling of Stress : Not at all  Social Connections:  Moderately Isolated  . Frequency of Communication with Friends and Family: More than three times a week  . Frequency of Social Gatherings with Friends and Family: More than three times a week  . Attends Religious Services: More than 4 times per year  . Active Member of Clubs or Organizations: No  . Attends Archivist Meetings: Never  . Marital Status: Widowed    Tobacco Counseling Counseling given: Not Answered Comment: smoking cigars- quit in 13    Clinical Intake:  Pre-visit preparation completed: Yes  Pain : No/denies pain     BMI - recorded: 35.86 Nutritional Status: BMI > 30  Obese Nutritional Risks: None Diabetes: Yes CBG done?: No (100) Did pt. bring in CBG monitor from home?: No  How often do you need to have someone help you when you read instructions, pamphlets, or other written materials from your doctor or pharmacy?: 1 - Never  Diabetic?Nutrition Risk Assessment:  Has the patient had any N/V/D within the last 2 months?  No  Does the patient have any non-healing wounds?  No  Has the patient had any unintentional weight loss or weight gain?  No   Diabetes:  Is the patient diabetic?  Yes  If diabetic, was a CBG obtained today?  Yes  Did the patient bring in their glucometer from home?  No  How often do you monitor your CBG's? Twice a day .   Financial Strains and Diabetes Management:  Are you having any financial strains with the device, your supplies or your medication? No .  Does the patient want to be seen by Chronic Care Management for management of their diabetes?  No  Would the patient like to be referred to a Nutritionist or for Diabetic Management?  No   Diabetic Exams:  Diabetic Eye Exam: Completed 01/03/20 Diabetic Foot Exam: Completed 12/20/19 pe Endocrine Dr   Caryl Asp Needed?: No  Information entered by :: Charlott Rakes, LPN   Activities of Daily Living In your present state of health, do you have any difficulty  performing the following activities: 12/20/2020  Hearing? Darreld Mclean  Comment selective/ mild loss  Vision? N  Difficulty concentrating or making decisions? N  Walking or climbing stairs? Y  Dressing or bathing? N  Doing errands, shopping? N  Preparing Food and eating ? N  Using the Toilet? N  In the past six months, have you accidently leaked urine? N  Do you have problems with loss of bowel control? N  Managing your Medications? N  Managing your Finances? N  Housekeeping or managing your Housekeeping? N  Some recent data might be hidden    Patient Care Team: [name], NP as PCP - General (Family Medicine) Melida Quitter, MD as Consulting Physician (Otolaryngology) Charolette Forward, MD as Consulting Physician (Cardiology) Philemon Kingdom, MD as Consulting Physician (Internal Medicine) Viona Gilmore, [facility] as Pharmacist (Pharmacist) Dimitri Ped, RN as Case Manager  Indicate any recent Medical Services you may have received from other than [facility] providers in the past year (date may be approximate).     Assessment:   This is a routine wellness examination for Dorothy.  Hearing/Vision screen  Hearing Screening   125Hz  250Hz  500Hz  1000Hz  2000Hz  3000Hz  4000Hz  6000Hz  8000Hz   Right ear:           Left ear:           Comments: Pt has mild loss/ selective   Vision Screening Comments: Pt follows up With Dr [place] for annual eye exams   Dietary issues and exercise activities discussed: Current Exercise Habits: Home exercise routine, Type of exercise: walking;Other - see comments (ymca), Time (Minutes): > 60, Frequency (Times/Week): 5, Weekly Exercise (Minutes/Week): 0  Goals    . Patient Stated     Stay around for a while     . Pharmacy Care Plan     CARE PLAN ENTRY  Current Barriers:  . Chronic Disease Management support, education, and care coordination needs related to Hypertension, Hyperlipidemia, Diabetes, and History of NSTEMI (2017), vitamin B12 deficiency, Right sided  sciatica, BPH   Hypertension . Pharmacist Clinical Goal(s): o Over the next 120 days, patient will work with PharmD and providers to maintain BP goal <130/80 . Current regimen:   Lisinopril 20 mg, 1 tablet once daily  Metoprolol succinate (Toprol-XL) 100mg , 1 tablet once daily . Interventions: o We discussed monitoring BP at home and providing readings at next cardiology visit for further assessment. . Patient self care activities - Over the next 120 days, patient will: o Recommended to check BP at home weekly, document, and provide at future appointments o Ensure daily salt intake < 2300 mg/day  Hyperlipidemia/ History of NSTEMI, 2017 . Pharmacist Clinical Goal(s): o Over the next 120 days, patient will work with PharmD and providers to maintain LDL goal < 70 . Current regimen:  . Atorvastatin 40mg , 1 tablet daily at 6pm . Nitroglycerin 0.4mg  SL, 1 tablet under tongue every 5 minutes as needed for chest pain  . Patient self care activities - Over the next 120 days, patient will: o Continue medications as directed.  Diabetes . Pharmacist Clinical Goal(s): o Over the next 120 days, patient will work with PharmD and providers to achieve A1c goal <7% . Current regimen:   Glipizide 5mg , 1 tablet twice daily with a meal  Metformin 1000mg , 1 tablet twice daily with meals  . Interventions: o We discussed importance of BG monitoring as instructed and alternating times of day in order to see full picture for blood sugars o Discussed following the healthy plate method which includes: . Fill half of  your plate with nonstarchy vegetables, such as spinach, broccoli, carrots and tomatoes. Venida Jarvis a quarter of your plate with a protein, such as tuna, lean pork or chicken. Venida Jarvis the last quarter with a whole-grain item, such as brown rice, or a starchy vegetable, such as green peas or potatoes. . Include "good" fats such as nuts or avocados in small amounts. . Patient self care activities -  Over the next 120 days, patient will: o Check blood sugar as instructed document, and provide at future appointments o Work on remembering to taking evening doses of glipizide and metformin and consider the use of an alarm to help as a reminder  Vitamin B12 deficiency (resolved) . Pharmacist Clinical Goal(s) o Over the next 120 days, patient will work with PharmD and providers to maintain vitamin B12 level: 211 - 911 pg/mL . Current regimen:  o Cyanocobalamin 5081mcg, 5,075mcg every 3rd day . Patient self care activities o Patient will continue medications as directed.   Right sided sciatica  . Pharmacist Clinical Goal(s) o Over the next 120 days, patient will work with PharmD and providers to ease the pain from sciatica . Patient self care activities o Patient will restart using stretching exercises to help with the pain  BPH (Benign prostatic hyperplasia)  . Pharmacist Clinical Goal(s) o Over the next 120 days, patient will work with PharmD and providers to minimize urinary symptoms. . Current regimen:  o tamsulosin 0.4mg , 1 capsule once daily . Patient self care activities o Patient will continue medications as directed.   Medication management . Pharmacist Clinical Goal(s): o Over the next 120 days, patient will work with PharmD and providers to achieve optimal medication adherence . Current pharmacy: [facility] . Interventions o Comprehensive medication review performed. o Continue current medication management strategy (pill box).  . Patient self care activities - Over the next 120 days, patient will: o Take medications as prescribed o Report any questions or concerns to PharmD and/or provider(s)  Please see past updates related to this goal by clicking on the "Past Updates" button in the selected goal      . RNCM: Better self manage and track my diabetes -Diabetes Type 2     Timeframe:  Long-Range Goal Priority:  Medium Start Date:    10/18/20                        Expected End Date:        01/16/21            Follow Up Date by 12/22/20 at 9AM   - check blood sugar at prescribed times try checking 1 1/2-2 hours after eating -recheck blood sugar if you get an unexpected number - check blood sugar before and after exercise - check blood sugar if I feel it is too high or too low - enter blood sugar readings and medication or insulin into daily log - take the blood sugar log to all doctor visits - take the blood sugar meter to all doctor visits    Why is this important?    Checking your blood sugar at home helps to keep it from getting very high or very low.   Writing the results in a diary or log helps the doctor know how to care for you.   Your blood sugar log should have the time, date and the results.   Also, write down the amount of insulin or other medicine that you  take.   Other information, like what you ate, exercise done and how you were feeling, will also be helpful.     Notes:     . RNCM: Develop and follow self management plan for Atrial Fibrillation     Timeframe:  Long-Range Goal Priority:  High Start Date:      10/18/20                       Expected End Date:      01/16/21                 Follow Up Date 12/22/20 at 9AM    - check pulse (heart) rate once a day - cut down alcohol use - make a plan to exercise regularly - make a plan to eat healthy - take medicine as prescribed  -review atrial fibrillation action plan    Why is this important?    Atrial fibrillation may have no symptoms. Sometimes the symptoms get worse or happen more often.   It is important to keep track of what your symptoms are and when they happen.   A change in symptoms is important to discuss with your doctor or nurse.   Being active and healthy eating will also help you manage your heart condition.     Notes:     . Weight (lb) < 230 lb (104.3 kg)     Watch your portion sizes       Depression Screen PHQ 2/9 Scores 12/20/2020  10/18/2020 10/06/2020 08/25/2020 10/12/2019 10/08/2019 08/19/2018  PHQ - 2 Score 0 0 0 0 0 0 0    Fall Risk Fall Risk  12/20/2020 10/18/2020 10/06/2020 08/25/2020 10/12/2019  Falls in the past year? 0 0 0 0 1  Comment - - - - -  Number falls in past yr: 0 - - 0 0  Injury with Fall? 0 - - 0 0  Comment - - - N/A- no falls reported -  Risk for fall due to : Impaired vision - - Other (Comment) History of fall(s);Medication side effect  Risk for fall due to: Comment - - - neuropathy/ knee pain -  Follow up Falls prevention discussed - - Falls prevention discussed Falls evaluation completed;Education provided;Falls prevention discussed    FALL RISK PREVENTION PERTAINING TO THE HOME:  Any stairs in or around the home? Yes  If so, are there any without handrails? No  Home free of loose throw rugs in walkways, pet beds, electrical cords, etc? Yes  Adequate lighting in your home to reduce risk of falls? Yes   ASSISTIVE DEVICES UTILIZED TO PREVENT FALLS:  Life alert? No  Use of a cane, walker or w/c? No  Grab bars in the bathroom? No  Shower chair or bench in shower? No  Elevated toilet seat or a handicapped toilet? No   TIMED UP AND GO:  Was the test performed? No .      Cognitive Function: MMSE - Mini Mental State Exam 06/09/2018 06/06/2017  Not completed: (No Data) (No Data)     6CIT Screen 12/20/2020 10/12/2019  What Year? 0 points 0 points  What month? 0 points 0 points  What time? - 0 points  Count back from 20 0 points 0 points  Months in reverse 0 points 0 points  Repeat phrase 0 points 0 points  Total Score - 0    Immunizations Immunization History  Administered Date(s) Administered  . Fluad Quad(high Dose 65+) 07/05/2019  .  Influenza Split 07/18/2011, 07/06/2012, 07/07/2012  . Influenza Whole 06/09/2006, 06/17/2008, 07/10/2009, 05/18/2010  . Influenza, High Dose Seasonal PF 06/11/2016, 06/06/2017, 05/26/2018  . Influenza,inj,Quad PF,6+ Mos 06/14/2013, 06/02/2014, 07/14/2015   . PFIZER(Purple Top)SARS-COV-2 Vaccination 09/28/2019, 10/19/2019, 06/18/2020  . Pneumococcal Conjugate-13 07/14/2014  . Pneumococcal Polysaccharide-23 06/09/2006  . Td 09/09/2001  . Tdap 02/20/2012    TDAP status: Up to date  Flu Vaccine status: Up to date  Pneumococcal vaccine status: Up to date  Covid-19 vaccine status: Completed vaccines  Qualifies for Shingles Vaccine? Yes   Zostavax completed No   Shingrix Completed?: No.    Education has been provided regarding the importance of this vaccine. Patient has been advised to call insurance company to determine out of pocket expense if they have not yet received this vaccine. Advised may also receive vaccine at local pharmacy or Health Dept. Verbalized acceptance and understanding.  Screening Tests Health Maintenance  Topic Date Due  . Hepatitis C Screening  Never done  . FOOT EXAM  08/13/2018  . COVID-19 Vaccine (4 - Booster for Pfizer series) 12/17/2020  . OPHTHALMOLOGY EXAM  01/02/2021  . INFLUENZA VACCINE  04/09/2021  . HEMOGLOBIN A1C  06/20/2021  . TETANUS/TDAP  02/19/2022  . PNA vac Low Risk Adult  Completed  . HPV VACCINES  Aged Out  . COLONOSCOPY (Pts 45-47yrs Insurance coverage will need to be confirmed)  Discontinued    Health Maintenance  Health Maintenance Due  Topic Date Due  . Hepatitis C Screening  Never done  . FOOT EXAM  08/13/2018  . COVID-19 Vaccine (4 - Booster for Pfizer series) 12/17/2020    Colorectal cancer screening: No longer required.    Additional Screening:  Hepatitis C Screening: does qualify  Vision Screening: Recommended annual ophthalmology exams for early detection of glaucoma and other disorders of the eye. Is the patient up to date with their annual eye exam?  Yes  Who is the provider or what is the name of the office in which the patient attends annual eye exams? Dr [place]  If pt is not established with a provider, would they like to be referred to a provider to establish  care? No .   Dental Screening: Recommended annual dental exams for proper oral hygiene  Community Resource Referral / Chronic Care Management: CRR required this visit?  No   CCM required this visit?  No      Plan:     I have personally reviewed and noted the following in the patient's chart:   . Medical and social history . Use of alcohol, tobacco or illicit drugs  . Current medications and supplements . Functional ability and status . Nutritional status . Physical activity . Advanced directives . List of other physicians . Hospitalizations, surgeries, and ER visits in previous 12 months . Vitals . Screenings to include cognitive, depression, and falls . Referrals and appointments  In addition, I have reviewed and discussed with patient certain preventive protocols, quality metrics, and best practice recommendations. A written personalized care plan for preventive services as well as general preventive health recommendations were provided to patient.     Willette Brace, LPN   [id number]   Nurse Notes: None

## 2020-12-20 NOTE — Patient Instructions (Addendum)
Charles [name] , Thank you for taking time to come for your Medicare Wellness Visit. I appreciate your ongoing commitment to your health goals. Please review the following plan we discussed and let me know if I can assist you in the future.   Screening recommendations/referrals: Colonoscopy: No longer required  Recommended yearly ophthalmology/optometry visit for glaucoma screening and checkup Recommended yearly dental visit for hygiene and checkup  Vaccinations: Influenza vaccine: Up to date Pneumococcal vaccine: Up to date Tdap vaccine: Up to date Shingles vaccine: Shingrix discussed. Please contact your pharmacy for coverage information.    Covid-19: Completed /19, 2/9, & 06/18/20  Advanced directives: Please bring a copy of your health care power of attorney and living will to the office at your convenience.  Conditions/risks identified: Stay around for a while   Next appointment: Follow up in one year for your annual wellness visit.   Preventive Care 80 Years and Older, Male Preventive care refers to lifestyle choices and visits with your health care provider that can promote health and wellness. What does preventive care include?  A yearly physical exam. This is also called an annual well check.  Dental exams once or twice a year.  Routine eye exams. Ask your health care provider how often you should have your eyes checked.  Personal lifestyle choices, including:  Daily care of your teeth and gums.  Regular physical activity.  Eating a healthy diet.  Avoiding tobacco and drug use.  Limiting alcohol use.  Practicing safe sex.  Taking low doses of aspirin every day.  Taking vitamin and mineral supplements as recommended by your health care provider. What happens during an annual well check? The services and screenings done by your health care provider during your annual well check will depend on your age, overall health, lifestyle risk factors, and family history of  disease. Counseling  Your health care provider may ask you questions about your:  Alcohol use.  Tobacco use.  Drug use.  Emotional well-being.  Home and relationship well-being.  Sexual activity.  Eating habits.  History of falls.  Memory and ability to understand (cognition).  Work and work Statistician. Screening  You may have the following tests or measurements:  Height, weight, and BMI.  Blood pressure.  Lipid and cholesterol levels. These may be checked every 5 years, or more frequently if you are over 3 years old.  Skin check.  Lung cancer screening. You may have this screening every year starting at age 98 if you have a 30-pack-year history of smoking and currently smoke or have quit within the past 15 years.  Fecal occult blood test (FOBT) of the stool. You may have this test every year starting at age 69.  Flexible sigmoidoscopy or colonoscopy. You may have a sigmoidoscopy every 5 years or a colonoscopy every 10 years starting at age 58.  Prostate cancer screening. Recommendations will vary depending on your family history and other risks.  Hepatitis C blood test.  Hepatitis B blood test.  Sexually transmitted disease (STD) testing.  Diabetes screening. This is done by checking your blood sugar (glucose) after you have not eaten for a while (fasting). You may have this done every 1-3 years.  Abdominal aortic aneurysm (AAA) screening. You may need this if you are a current or former smoker.  Osteoporosis. You may be screened starting at age 71 if you are at high risk. Talk with your health care provider about your test results, treatment options, and if necessary, the need for  more tests. Vaccines  Your health care provider may recommend certain vaccines, such as:  Influenza vaccine. This is recommended every year.  Tetanus, diphtheria, and acellular pertussis (Tdap, Td) vaccine. You may need a Td booster every 10 years.  Zoster vaccine. You may  need this after age 24.  Pneumococcal 13-valent conjugate (PCV13) vaccine. One dose is recommended after age 43.  Pneumococcal polysaccharide (PPSV23) vaccine. One dose is recommended after age 47. Talk to your health care provider about which screenings and vaccines you need and how often you need them. This information is not intended to replace advice given to you by your health care provider. Make sure you discuss any questions you have with your health care provider. Document Released: 09/22/2015 Document Revised: 05/15/2016 Document Reviewed: 06/27/2015 Elsevier Interactive Patient Education  2017 [place] Prevention in the Home Falls can cause injuries. They can happen to people of all ages. There are many things you can do to make your home safe and to help prevent falls. What can I do on the outside of my home?  Regularly fix the edges of walkways and driveways and fix any cracks.  Remove anything that might make you trip as you walk through a door, such as a raised step or threshold.  Trim any bushes or trees on the path to your home.  Use bright outdoor lighting.  Clear any walking paths of anything that might make someone trip, such as rocks or tools.  Regularly check to see if handrails are loose or broken. Make sure that both sides of any steps have handrails.  Any raised decks and porches should have guardrails on the edges.  Have any leaves, snow, or ice cleared regularly.  Use sand or salt on walking paths during winter.  Clean up any spills in your garage right away. This includes oil or grease spills. What can I do in the bathroom?  Use night lights.  Install grab bars by the toilet and in the tub and shower. Do not use towel bars as grab bars.  Use non-skid mats or decals in the tub or shower.  If you need to sit down in the shower, use a plastic, non-slip stool.  Keep the floor dry. Clean up any water that spills on the floor as soon as it  happens.  Remove soap buildup in the tub or shower regularly.  Attach bath mats securely with double-sided non-slip rug tape.  Do not have throw rugs and other things on the floor that can make you trip. What can I do in the bedroom?  Use night lights.  Make sure that you have a light by your bed that is easy to reach.  Do not use any sheets or blankets that are too big for your bed. They should not hang down onto the floor.  Have a firm chair that has side arms. You can use this for support while you get dressed.  Do not have throw rugs and other things on the floor that can make you trip. What can I do in the kitchen?  Clean up any spills right away.  Avoid walking on wet floors.  Keep items that you use a lot in easy-to-reach places.  If you need to reach something above you, use a strong step stool that has a grab bar.  Keep electrical cords out of the way.  Do not use floor polish or wax that makes floors slippery. If you must use wax, use non-skid  floor wax.  Do not have throw rugs and other things on the floor that can make you trip. What can I do with my stairs?  Do not leave any items on the stairs.  Make sure that there are handrails on both sides of the stairs and use them. Fix handrails that are broken or loose. Make sure that handrails are as long as the stairways.  Check any carpeting to make sure that it is firmly attached to the stairs. Fix any carpet that is loose or worn.  Avoid having throw rugs at the top or bottom of the stairs. If you do have throw rugs, attach them to the floor with carpet tape.  Make sure that you have a light switch at the top of the stairs and the bottom of the stairs. If you do not have them, ask someone to add them for you. What else can I do to help prevent falls?  Wear shoes that:  Do not have high heels.  Have rubber bottoms.  Are comfortable and fit you well.  Are closed at the toe. Do not wear sandals.  If you  use a stepladder:  Make sure that it is fully opened. Do not climb a closed stepladder.  Make sure that both sides of the stepladder are locked into place.  Ask someone to hold it for you, if possible.  Clearly mark and make sure that you can see:  Any grab bars or handrails.  First and last steps.  Where the edge of each step is.  Use tools that help you move around (mobility aids) if they are needed. These include:  Canes.  Walkers.  Scooters.  Crutches.  Turn on the lights when you go into a dark area. Replace any light bulbs as soon as they burn out.  Set up your furniture so you have a clear path. Avoid moving your furniture around.  If any of your floors are uneven, fix them.  If there are any pets around you, be aware of where they are.  Review your medicines with your doctor. Some medicines can make you feel dizzy. This can increase your chance of falling. Ask your doctor what other things that you can do to help prevent falls. This information is not intended to replace advice given to you by your health care provider. Make sure you discuss any questions you have with your health care provider. Document Released: 06/22/2009 Document Revised: 02/01/2016 Document Reviewed: 09/30/2014 Elsevier Interactive Patient Education  2017 [employer].

## 2020-12-21 ENCOUNTER — Ambulatory Visit (INDEPENDENT_AMBULATORY_CARE_PROVIDER_SITE_OTHER): Payer: Medicare Other

## 2020-12-21 DIAGNOSIS — E1159 Type 2 diabetes mellitus with other circulatory complications: Secondary | ICD-10-CM

## 2020-12-21 DIAGNOSIS — I4891 Unspecified atrial fibrillation: Secondary | ICD-10-CM | POA: Diagnosis not present

## 2020-12-21 DIAGNOSIS — E1165 Type 2 diabetes mellitus with hyperglycemia: Secondary | ICD-10-CM | POA: Diagnosis not present

## 2020-12-21 NOTE — Chronic Care Management (AMB) (Signed)
Chronic Care Management   CCM RN Visit Note  12/21/2020 Name: Charles [name] MRN: [id number] DOB: 08-25-1941  Subjective: Charles [name] is a 80 y.o. year old male who is a primary care patient of Charles Peng, NP. The care management team was consulted for assistance with disease management and care coordination needs.    Engaged with patient by telephone for follow up visit in response to provider referral for case management and/or care coordination services.   Consent to Services:  The patient was given information about Chronic Care Management services, agreed to services, and gave verbal consent prior to initiation of services.  Please see initial visit note for detailed documentation.   Patient agreed to services and verbal consent obtained.   Assessment: Review of patient past medical history, allergies, medications, health status, including review of consultants reports, laboratory and other test data, was performed as part of comprehensive evaluation and provision of chronic care management services.   SDOH (Social Determinants of Health) assessments and interventions performed:    CCM Care Plan  Allergies  Allergen Reactions  . Bee Venom Anaphylaxis    Outpatient Encounter Medications as of 12/21/2020  Medication Sig Note  . atorvastatin (LIPITOR) 40 MG tablet Take 1 tablet (40 mg total) by mouth daily at 6 PM.   . Cyanocobalamin 5000 MCG TBDP Take 5,000 mcg by mouth every 3 (three) days.    Marland Kitchen ELIQUIS 5 MG TABS tablet Take 5 mg by mouth 2 (two) times daily.   Marland Kitchen EPINEPHrine (EPI-PEN) 0.3 mg/0.3 mL DEVI Inject 0.3 mg into the muscle daily as needed (allergic reaction).   . gabapentin (NEURONTIN) 100 MG capsule TAKE 1 CAPSULE BY MOUTH AT  BEDTIME   . glipiZIDE (GLUCOTROL) 5 MG tablet TAKE 1 TABLET BY MOUTH 2  TIMES DAILY BEFORE A MEAL   . Insulin Syringes, Disposable, U-100 0.5 ML MISC Use 1x a day   . lisinopril (ZESTRIL) 20 MG tablet Take 20 mg by mouth daily.   .  metFORMIN (GLUCOPHAGE) 1000 MG tablet TAKE 1 TABLET BY MOUTH  TWICE DAILY WITH MEALS   . metoprolol succinate (TOPROL-XL) 100 MG 24 hr tablet Take 100 mg by mouth daily.  10/18/2020: Increased to 150 mg   . Multiple Vitamins-Minerals (PRESERVISION AREDS 2 PO) Take 1 tablet by mouth daily.   . nitroGLYCERIN (NITROSTAT) 0.4 MG SL tablet Place 1 tablet (0.4 mg total) under the tongue every 5 (five) minutes as needed for chest pain. (Patient not taking: Reported on 12/20/2020)   . ONETOUCH DELICA LANCETS FINE MISC Use to check sugar daily   . ONETOUCH VERIO test strip USE 1 STRIP TO CHECK GLUCOSE ONCE DAILY   . spironolactone (ALDACTONE) 25 MG tablet Take 25 mg by mouth daily. (Patient not taking: Reported on 12/20/2020)   . tamsulosin (FLOMAX) 0.4 MG CAPS capsule TAKE 1 CAPSULE BY MOUTH  DAILY    No facility-administered encounter medications on file as of 12/21/2020.    Patient Active Problem List   Diagnosis Date Noted  . Atrial fibrillation ([place]) 10/25/2020  . 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 Epistaxis 09/29/2018  . Hyperkalemia 09/29/2018  . AKI (acute kidney injury) ([place]) 09/29/2018  . Lumbar radiculopathy 08/06/2017  . Greater trochanteric bursitis of left hip 07/04/2017  . Rotator cuff arthropathy of left shoulder 01/24/2017  . AC (acromioclavicular) arthritis 01/24/2017  . Vitamin B12 deficiency 01/18/2016  . NSTEMI (non-ST elevated myocardial infarction) ([place]) 10/19/2015  . Acute non Q wave MI (myocardial infarction), initial  episode of care ([place]) 10/19/2015  . Peripheral neuropathy 10/13/2015  . 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 Actinic keratosis of right cheek 10/04/2014  . Contact dermatitis and eczema 02/10/2014  . 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 Acute blood loss anemia 08/27/2013  . Insomnia 08/27/2013  . Hip fracture, right ([place]) 07/25/2013  . 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 Renal cell carcinoma ([place])  07/25/2013  . History of colonic polyps 07/20/2012  . Hyperlipidemia 11/13/2007  . Obesity 11/13/2007  . Essential hypertension 04/23/2007    Conditions to be addressed/monitored:DMII and atrial fibrillation  Care Plan : RNCM:Atrial Fibrillation (Adult)  Updates made by Dimitri Ped, RN since 12/21/2020 12:00 AM    Problem: Lack of self mangement plan for Atrial Fibrillation   Priority: High    Long-Range Goal: RNCM: Develolp and self manage atrial fibrilation   Start Date: 10/18/2020  Expected End Date: 07/09/2021  This Visit's Progress: On track  Recent Progress: On track  Priority: High  Note:   Current Barriers:  Marland Kitchen Knowledge Deficits related to new dx of atrial fibrillation-Reports no atrial fibrillation symptoms. Denies any shortness of breath or chest pain.  Reports taking Eliquis as directed.  Reports next appt with Dr. Terrence Dupont on 02/08/21. 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  . Chronic Disease Management support and education needs related to Atrial fibrillation Nurse Case Manager Clinical Goal(s):  Marland Kitchen Over the next 120 days, patient will verbalize understanding of plan for self management of atrial fibrillation . Over the next 120 days, patient will verbalize basic understanding of atrial fibrillation disease process and self health management plan as evidenced by decrease in symptoms, adherence to medications, keeping provider appts  Interventions:  . 1:1 collaboration with Charles Peng, NP regarding development and update of comprehensive plan of care as evidenced by provider attestation and co-signature . Inter-disciplinary care team collaboration (see longitudinal plan of care)  . Reinforced patient to call provider for worsening symptoms of atrial fibrillation  . Reinforced education to patient re: atrial fibrillation disease process and atrial fibrillation action plan  . Reviewed  medications with patient and discussed importance of taking medications as ordered to lower heart rate and taking anticoagulant . Reviewed scheduled/upcoming provider appointments including: Dr. Terrence Dupont cardiology 02/08/21, Dr. Cruzita Lederer 05/01/21, no upcoming primary care appt . Discussed plans with patient for ongoing care management follow up and provided patient with direct contact information for care management team Patient Goals/Self-Care Activities Patient will attend all scheduled provider appointments Patient will call provider office for new concerns or questions - check pulse (heart) rate once a day - cut down alcohol use - make a plan to exercise regularly - make a plan to eat healthy - take medicine as prescribed -review atrial fibrillation action plan -take anticoagulant to prevent stroke Follow Up Plan: Telephone follow up appointment with care management team member scheduled for: 01/25/21 at 9:30 AM The patient has been provided with contact information for the care management team and has been advised to call with any health related questions or concerns.         Care Plan : RNCM:Diabetes Type 2 (Adult)  Updates made by Dimitri Ped, RN since 12/21/2020 12:00 AM    Problem: RNCM: Lack of effective self management of Diabetes Type 2   Priority: Medium    Long-Range Goal: Effective self management  of blood sugars and Diabetes Type 2   Start Date: 10/18/2020  Expected End Date: 07/09/2021  This Visit's Progress: On track  Recent Progress: On track  Priority: Medium  Note:   Objective:  Lab Results  Component Value Date   HGBA1C 7.9 (A) 08/21/2020 .   Lab Results  Component Value Date   CREATININE 1.06 10/06/2020   CREATININE 1.02 10/08/2019   CREATININE 1.21 10/26/2018   Current Barriers:  Marland Kitchen Knowledge Deficits related to basic Diabetes pathophysiology and self care/management Reports saw Dr.Gherghe 12/19/20 and his Hemoglobin A1C was down to 7.3% . Does not  adhere to provider recommendations re: Low CHO diet and portion control. Reports watching portions better and avoiding second helpings . Reports blood sugars range 88-123 in the morning and he has not been checking at other times of the day, denies any hypoglycemia but does feel that he needs to eat something if his readings are below 100.  States he will go ahead and eat his breakfast . Reports going to gym 5 days a week walking 1-2 miles and working on exercise/weight machines . 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 patient will demonstrate improved adherence to prescribed treatment plan for diabetes self care/management as evidenced by: daily monitoring and recording of CBG  adherence to ADA/ carb modified diet exercise 3 days/week adherence to prescribed medication regimen Interventions:  . Collaboration with Charles Peng, NP regarding development and update of comprehensive plan of care as evidenced by provider attestation and co-signature . Inter-disciplinary care team collaboration (see longitudinal plan of care) . Reviewed education to patient about basic DM disease process . Reviewed medications with patient and discussed importance of medication adherence . Discussed plans with patient for ongoing care management follow up and provided patient with direct contact information for care management team . Reviewed scheduled/upcoming provider appointments including: Dr. Cruzita Lederer endocrinologist 05/01/21, Dr. Tamala Julian 12/26/20, ENT 01/11/21, CCM pharmacist 01/12/21 . Reinforced  to check cbg daily at varying times and record, calling provider for findings outside established parameters.   . Reinforced importance of getting regular exercise with resistance training at least 2 times a week . Instructed on different lower CHO nutritional supplements he can drink after his neck surgery if he is not able to eat as much as he usually does or if he  has any swallowing problems . Reinforced s/sx of hypoglycemia and actions to take to treat especially if he is unable to eat as much after his surgery and is taking the glipizide Patient Goals/Self-Care Activities . Over the next 90 days, patient will:  - Self administers oral medications as prescribed Attends all scheduled provider appointments Checks blood sugars as prescribed and utilize hyper and hypoglycemia protocol as needed Adheres to prescribed ADA/carb modified - check blood sugar at prescribed times try checking 1 1/2-2 hours after eating -try checking blood sugar after eating at different times -recheck blood sugar if you get an unexpected number - check blood sugar before and after exercise - check blood sugar if I feel it is too high or too low - enter blood sugar readings and medication or insulin into daily log - take the blood sugar log to all doctor visits - take the blood sugar meter to all doctor visits Follow Up Plan: Telephone follow up appointment with care management team member scheduled for: 01/25/21 at 9:30 AM The patient has been provided with contact information for the care  management team and has been advised to call with any health related questions or concerns.       Plan:Telephone follow up appointment with care management team member scheduled for:  01/25/21 and The patient has been provided with contact information for the care management team and has been advised to call with any health related questions or concerns.  Peter Garter RN, Jackquline Denmark, CDE Care Management Coordinator [facility] [phone], Mobile [phone]

## 2020-12-21 NOTE — Progress Notes (Signed)
[facility] [address] [place] Phone: [phone] Subjective:   I Charles [name] am serving as a Education administrator for Dr. Hulan Saas.  This visit occurred during the SARS-CoV-2 public health emergency.  Safety protocols were in place, including screening questions prior to the visit, additional usage of staff PPE, and extensive cleaning of exam room while observing appropriate contact time as indicated for disinfecting solutions.   I'm seeing this patient by the request  of:  [name], NP  CC:   [id number]   10/25/2020 Patient has been diagnosed with a parotid mass previously in 2020 on CT scan and had a fine-needle aspiration clarifying the benign aspect of this.  Patient though has had enlargement over the course of several weeks at this point.  I believe that it is passed 5 cm in size.  Patient denies though any difficulty with breathing or any difficulty with swallowing.  Patient has a follow-up with his physician for this next Thursday.  We discussed that if any worsening symptoms though before that he needs to seek medical attention immediately.  Patient is in agreement with the plan are getting labs including ESR and CRP as well as CBC to further evaluate for any infectious etiology.  Recently diagnosed and patient is on Eliquis.  We will get laboratory work-up with patient having some increase in lower extremity swelling to make sure no congestive heart failure.  Patient is following up with cardiology in 2 weeks.  We discussed with patient though if any increasing shortness of breath or chest pain to seek medical attention immediately.  Patient is in agreement with the plan.  Patient is doing relatively well with the low-dose gabapentin.  Patient is doing well with the exercises the other modalities at the moment.  We can always consider the injection if necessary but patient has many other comorbidities that is contributing at this  time.  At this moment will make no changes and patient will follow up with me again in 2 months.   Update  Charles [name] is a 80 y.o. male coming in with complaint of L hip pain. Surgery for mass excision on 01/03/2021. Patient states the hip is doing better but still feels pain at times. Believes he should stretch more as it has been helping.  Patient knows that he does do the exercises he does feel better.     Past Medical History:  Diagnosis Date  . Anemia    "when I was a lad"  . Arthritis    "right femur" (10/19/2015)  . AVM (arteriovenous malformation) of colon    2 - non-bleeding 2013  . Biceps muscle tear    right  . Cancer of kidney ([place]) 2000   left nephrectomy  . Coronary artery disease   . Eczema   . GERD (gastroesophageal reflux disease)   . Hernia, ventral   . Hyperlipidemia   . Hypertension   . Obesity   . Personal history of adenomatous colonic polyps 07/20/2012   3 + adenomas 2009 07/20/2012 2 diminutive polyps    . Pneumonia 1942; 1950s X 1  . Type II diabetes mellitus ([place])   . Walking pneumonia 2000's X 1   Past Surgical History:  Procedure Laterality Date  . CARDIAC CATHETERIZATION N/A 10/19/2015   Procedure: Left Heart Cath and Coronary Angiography;  Surgeon: Charolette Forward, MD;  Location: [facility];  Service: Cardiovascular;  Laterality: N/A;  . CARDIAC CATHETERIZATION N/A 10/19/2015  Procedure: Coronary Stent Intervention;  Surgeon: Charolette Forward, MD;  Location: [facility];  Service: Cardiovascular;  Laterality: N/A;  . CATARACT EXTRACTION, BILATERAL Bilateral 01/2018   Dr. Kathrin Penner did surgery and he developed MD and is seeing specialist now for MD treatment  . COLONOSCOPY     "nothing showed up this time"  . COLONOSCOPY W/ BIOPSIES AND POLYPECTOMY  X 1  . CORONARY ANGIOPLASTY    . FEMUR IM NAIL Right 07/26/2013   Procedure: INTRAMEDULLARY (IM) NAIL FEMORAL subtrochanteric;  Surgeon: Mauri Pole, MD;  Location: [facility];  Service:  Orthopedics;  Laterality: Right;  . FRACTURE SURGERY    . INGUINAL HERNIA REPAIR Left >3 times  . INGUINAL HERNIA REPAIR Right 2012  . NEPHRECTOMY Left 2000  . TONSILLECTOMY  1940s   Social History   Socioeconomic History  . Marital status: Widowed    Spouse name: Not on file  . Number of children: 3  . Years of education: Not on file  . Highest education level: Not on file  Occupational History  . Occupation: retired  Tobacco Use  . Smoking status: Former Smoker    Years: 20.00    Types: Cigarettes, Pipe, Cigars    Quit date: 09/09/1968    Years since quitting: 52.3  . Smokeless tobacco: Never Used  . Tobacco comment: smoking cigars- quit in 13   Substance and Sexual Activity  . Alcohol use: Yes    Alcohol/week: 1.0 standard drink    Types: 1 Cans of beer per week    Comment: occasionally   . Drug use: No  . Sexual activity: Never  Other Topics Concern  . 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     widowed    Three children, One in [place], one in [place] one in [place] - 2 : youngest daughter lives with him   Social Determinants of Health   Financial Resource Strain: Low Risk   . Difficulty of Paying Living Expenses: Not hard at all  Food Insecurity: No Food Insecurity  . Worried About Charity fundraiser in the Last Year: Never true  . Ran Out of Food in the Last Year: Never true  Transportation Needs: No Transportation Needs  . Lack of Transportation (Medical): No  . Lack of Transportation (Non-Medical): No  Physical Activity: Sufficiently Active  . Days of Exercise per Week: 5 days  . Minutes of Exercise per Session: 120 min  Stress: No Stress Concern Present  . Feeling of Stress : Not at all  Social Connections: Moderately Isolated  . Frequency of Communication with Friends and Family: More than three times a week  .  Frequency of Social Gatherings with Friends and Family: More than three times a week  . Attends Religious Services: More than 4 times per year  . Active Member of Clubs or Organizations: No  . Attends Archivist Meetings: Never  . Marital Status: Widowed   Allergies  Allergen Reactions  . Bee Venom Anaphylaxis   Family History  Problem Relation Age of Onset  . Drug abuse Other   . Cancer Other   . Heart disease Other   . Lung disease Other   . Diabetes Mother   . Diabetes Sister   . Colon cancer Neg Hx   . Stomach cancer Neg  Hx     Current Outpatient Medications (Endocrine & Metabolic):  .  glipiZIDE (GLUCOTROL) 5 MG tablet, TAKE 1 TABLET BY MOUTH 2  TIMES DAILY BEFORE A MEAL .  metFORMIN (GLUCOPHAGE) 1000 MG tablet, TAKE 1 TABLET BY MOUTH  TWICE DAILY WITH MEALS  Current Outpatient Medications (Cardiovascular):  .  atorvastatin (LIPITOR) 40 MG tablet, Take 1 tablet (40 mg total) by mouth daily at 6 PM. .  EPINEPHrine (EPI-PEN) 0.3 mg/0.3 mL DEVI, Inject 0.3 mg into the muscle daily as needed (allergic reaction). Marland Kitchen  lisinopril (ZESTRIL) 20 MG tablet, Take 20 mg by mouth daily. .  metoprolol succinate (TOPROL-XL) 100 MG 24 hr tablet, Take 100 mg by mouth daily.  .  nitroGLYCERIN (NITROSTAT) 0.4 MG SL tablet, Place 1 tablet (0.4 mg total) under the tongue every 5 (five) minutes as needed for chest pain. Mar[place] Kitchen  spironolactone (ALDACTONE) 25 MG tablet, Take 25 mg by mouth daily.    Current Outpatient Medications (Hematological):  Marland Kitchen  Cyanocobalamin 5000 MCG TBDP, Take 5,000 mcg by mouth every 3 (three) days.  Marland Kitchen  ELIQUIS 5 MG TABS tablet, Take 5 mg by mouth 2 (two) times daily.  Current Outpatient Medications (Other):  .  gabapentin (NEURONTIN) 100 MG capsule, TAKE 1 CAPSULE BY MOUTH AT  BEDTIME .  Insulin Syringes, Disposable, U-100 0.5 ML MISC, Use 1x a day .  Multiple Vitamins-Minerals (PRESERVISION AREDS 2 PO), Take 1 tablet by mouth daily. Glory Rosebush DELICA LANCETS  FINE MISC, Use to check sugar daily .  ONETOUCH VERIO test strip, USE 1 STRIP TO CHECK GLUCOSE ONCE DAILY .  tamsulosin (FLOMAX) 0.4 MG CAPS capsule, TAKE 1 CAPSULE BY MOUTH  DAILY   Reviewed prior external information including notes and imaging from  primary care provider As well as notes that were available from care everywhere and other healthcare systems.  Past medical history, social, surgical and family history all reviewed in electronic medical record.  No pertanent information unless stated regarding to the chief complaint.   Review of Systems:  No headache, visual changes, nausea, vomiting, diarrhea, constipation, dizziness, abdominal pain, skin rash, fevers, chills, night sweats, weight loss, swollen lymph nodes,  joint swelling, chest pain, shortness of breath, mood changes. POSITIVE muscle aches, body aches  Objective  Blood pressure 120/90, pulse 73, height 5' 11" (1.803 m), weight 256 lb (116.1 kg), SpO2 100 %.   General: No apparent distress alert and oriented x3 mood and affect normal, dressed appropriately.  HEENT: Pupils equal, extraocular movements intact large mass still noted on the left side of patient's parotid area. Respiratory: Patient's speak in full sentences and does not appear short of breath  Cardiovascular: No lower extremity edema, non tender, no erythema  Gait normal with good balance and coordination.  MSK: Low back exam significant loss of lordosis.  Tightness with FABER test bilaterally.  Mild tightness with straight leg test.  Does have some possible mild radicular symptoms noted.  Mild peripheral neuropathy noted but no true worsening of radicular symptoms with straight leg.  4 out of 5 strength in lower extremities but symmetric.    Impression and Recommendations:     The above documentation has been reviewed and is accurate and complete Lyndal Pulley, DO

## 2020-12-21 NOTE — Patient Instructions (Signed)
Visit Information  PATIENT GOALS: Goals Addressed            This Visit's Progress   . RNCM: Better self manage and track my diabetes -Diabetes Type 2   On track    Timeframe:  Long-Range Goal Priority:  Medium Start Date:    10/18/20                       Expected End Date:        07/09/21            Follow Up Date by 01/25/21 at 9:30 AM   - check blood sugar at prescribed times try checking 1 1/2-2 hours after eating -try checking blood sugar after eating at different times -recheck blood sugar if you get an unexpected number - check blood sugar before and after exercise - check blood sugar if I feel it is too high or too low - enter blood sugar readings and medication or insulin into daily log - take the blood sugar log to all doctor visits - take the blood sugar meter to all doctor visits    Why is this important?    Checking your blood sugar at home helps to keep it from getting very high or very low.   Writing the results in a diary or log helps the doctor know how to care for you.   Your blood sugar log should have the time, date and the results.   Also, write down the amount of insulin or other medicine that you take.   Other information, like what you ate, exercise done and how you were feeling, will also be helpful.     Notes:     . RNCM: Develop and follow self management plan for Atrial Fibrillation   On track    Timeframe:  Long-Range Goal Priority:  High Start Date:      10/18/20                       Expected End Date:      07/09/21                 Follow Up Date 01/25/21 at 9:30 AM    - check pulse (heart) rate once a day - cut down alcohol use - make a plan to exercise regularly - make a plan to eat healthy - take medicine as prescribed  -review atrial fibrillation action plan -take anticoagulant to prevent stroke Why is this important?    Atrial fibrillation may have no symptoms. Sometimes the symptoms get worse or happen more often.   It is  important to keep track of what your symptoms are and when they happen.   A change in symptoms is important to discuss with your doctor or nurse.   Being active and healthy eating will also help you manage your heart condition.     Notes:       Hypoglycemia Hypoglycemia is when the sugar (glucose) level in your blood is too low. Low blood sugar can happen to people who have diabetes and people who do not have diabetes. Low blood sugar can happen quickly, and it can be an emergency. What are the causes? This condition happens most often in people who have diabetes and may be caused by:  Diabetes medicine.  Not eating enough, or not eating often enough.  Doing more physical activity.  Drinking alcohol on an empty stomach. If you  do not have diabetes, hypoglycemia may be caused by:  A tumor in the pancreas.  Not eating enough, or not eating for long periods at a time (fasting).  A very bad infection or illness.  Problems after having weight loss (bariatric) surgery.  Kidney failure or liver failure.  Certain medicines. What increases the risk? This condition is more likely to develop in people who:  Have diabetes and take medicines to lower their blood sugar.  Abuse alcohol.  Have a very bad illness. What are the signs or symptoms? Symptoms depend on whether your low blood sugar is mild, moderate, or very low. Mild  Hunger.  Feeling worried or nervous (anxious).  Sweating and feeling clammy.  Feeling dizzy or light-headed.  Being sleepy or having trouble sleeping.  Feeling like you may vomit (nauseous).  A fast heartbeat.  A headache.  Blurry vision.  Being irritable or grouchy.  Tingling or loss of feeling (numbness) around your mouth, lips, or tongue.  Trouble with moving (coordination). Moderate  Confusion and poor judgment.  Behavior changes.  Weakness.  Uneven heartbeats. Very low Very low blood sugar (severe hypoglycemia) is a medical  emergency. It can cause:  Fainting.  Jerky movements that you cannot control (seizure).  Loss of consciousness (coma).  Death. How is this treated? Treating low blood sugar Low blood sugar is often treated by eating or drinking something sugary right away. The snack should contain 15 grams of a fast-acting carb (carbohydrate). Options include:  4 oz (120 mL) of fruit juice.  4-6 oz (120-150 mL) of regular soda (not diet soda).  8 oz (240 mL) of low-fat milk.  Several pieces of hard candy. Check food labels to find out how many to eat for 15 grams.  1 Tbsp (15 mL) of sugar or honey. Treating low blood sugar if you have diabetes If you can think clearly and swallow safely, follow the 15:15 rule:  Take 15 grams of a fast-acting carb. Talk with your doctor about how much you should take.  Always keep a source of fast-acting carb with you, such as: ? Sugar tablets (glucose pills). Take 4 pills. ? Several pieces of hard candy. Check food labels to see how many pieces to eat for 15 grams. ? 4 oz (120 mL) of fruit juice. ? 4-6 oz (120-150 mL) of regular (not diet) soda. ? 1 Tbsp (15 mL) of honey or sugar.  Check your blood sugar 15 minutes after you take the carb.  If your blood sugar is still at or below 70 mg/dL (3.9 mmol/L), take 15 grams of a carb again.  If your blood sugar does not go above 70 mg/dL (3.9 mmol/L) after 3 tries, get help right away.  After your blood sugar goes back to normal, eat a meal or a snack within 1 hour.   Treating very low blood sugar If your blood sugar is at or below 54 mg/dL (3 mmol/L), you have very low blood sugar, or severe hypoglycemia. This is an emergency. Get medical help right away. If you have very low blood sugar and you cannot eat or drink, you will need to be given a hormone called glucagon. A family member or friend should learn how to check your blood sugar and how to give you glucagon. Ask your doctor if you need to have an emergency  glucagon kit at home. Very low blood sugar may also need to be treated in a hospital. Follow these instructions at home: General instructions  Take over-the-counter and prescription medicines only as told by your doctor.  Stay aware of your blood sugar as told by your doctor.  If you drink alcohol: ? Limit how much you use to:  0-1 drink a day for nonpregnant women.  0-2 drinks a day for men. ? Be aware of how much alcohol is in your drink. In the [place] one drink equals one 12 oz bottle of beer (355 mL), one 5 oz glass of wine (148 mL), or one 1 oz glass of hard liquor (44 mL).  Keep all follow-up visits as told by your doctor. This is important. If you have diabetes:  Always have a rapid-acting carb (15 grams) option with you to treat low blood sugar.  Follow your diabetes care plan as told by your doctor. Make sure you: ? Know the symptoms of low blood sugar. ? Check your blood sugar as often as told by your doctor. Always check it before and after exercise. ? Always check your blood sugar before you drive. ? Take your medicines as told. ? Follow your meal plan. ? Eat on time. Do not skip meals.  Share your diabetes care plan with: ? Your work or school. ? People you live with.  Carry a card or wear jewelry that says you have diabetes.   Contact a doctor if:  You have trouble keeping your blood sugar in your target range.  You have low blood sugar often. Get help right away if:  You still have symptoms after you eat or drink something that contains 15 grams of fast-acting carb and you cannot get your blood sugar above 70 mg/dL by following the 15:15 rule.  Your blood sugar is at or below 54 mg/dL (3 mmol/L).  You have a seizure.  You faint. These symptoms may be an emergency. Do not wait to see if the symptoms will go away. Get medical help right away. Call your local emergency services (911 in the [place] Do not drive yourself to the  hospital. Summary  Hypoglycemia happens when the level of sugar (glucose) in your blood is too low.  Low blood sugar can happen to people who have diabetes and people who do not have diabetes. Low blood sugar can happen quickly, and it can be an emergency.  Make sure you know the symptoms of low blood sugar and know how to treat it.  Always keep a source of sugar (fast-acting carb) with you to treat low blood sugar. This information is not intended to replace advice given to you by your health care provider. Make sure you discuss any questions you have with your health care provider. Document Revised: 07/21/2019 Document Reviewed: 07/21/2019 Elsevier Patient Education  2021 [place].   Patient verbalizes understanding of instructions provided today and agrees to view in [place].   Telephone follow up appointment with care management team member scheduled for: 01/25/21 at 9:30 AM  [place], [facility], CDE Care Management Coordinator [facility] [phone], Mobile [phone]

## 2020-12-22 ENCOUNTER — Telehealth: Payer: Medicare Other

## 2020-12-26 ENCOUNTER — Encounter: Payer: Self-pay | Admitting: Family Medicine

## 2020-12-26 ENCOUNTER — Other Ambulatory Visit: Payer: Self-pay

## 2020-12-26 ENCOUNTER — Ambulatory Visit: Payer: Medicare Other | Admitting: Family Medicine

## 2020-12-26 DIAGNOSIS — M5416 Radiculopathy, lumbar region: Secondary | ICD-10-CM | POA: Diagnosis not present

## 2020-12-26 DIAGNOSIS — K118 Other diseases of salivary glands: Secondary | ICD-10-CM

## 2020-12-26 NOTE — Assessment & Plan Note (Signed)
Do believe that patient's pain is likely secondary to more of the L4-L5 degenerative disc disease causing more of a nerve impingement.  Discussed the gabapentin still at this time.  Patient does have some cramping of the legs at night that can be more secondary to sleep apnea.  Patient would like to decline a sleep study at this point because he would not want to wear a CPAP.  We discussed that this could be very beneficial for him.  Follow-up with me in 2 months.  If worsening pain can consider advanced imaging or formal physical therapy

## 2020-12-26 NOTE — Assessment & Plan Note (Signed)
Patient is having that removed in the near future.  We will discuss again after 6 weeks after his surgery to see how patient is doing with the back.  Worsening pain will consider the possibility of MRI of the back or formal physical therapy.  Follow-up again in 2 months

## 2020-12-26 NOTE — Patient Instructions (Addendum)
Good to see you Lets not change anything for now Try the stretching regularly Glad it is not stopping you from anything See me again in 2 months

## 2020-12-28 DIAGNOSIS — H35372 Puckering of macula, left eye: Secondary | ICD-10-CM | POA: Diagnosis not present

## 2020-12-28 DIAGNOSIS — H353133 Nonexudative age-related macular degeneration, bilateral, advanced atrophic without subfoveal involvement: Secondary | ICD-10-CM | POA: Diagnosis not present

## 2020-12-28 DIAGNOSIS — H34811 Central retinal vein occlusion, right eye, with macular edema: Secondary | ICD-10-CM | POA: Diagnosis not present

## 2021-01-01 ENCOUNTER — Other Ambulatory Visit (HOSPITAL_COMMUNITY): Payer: Medicare Other

## 2021-01-05 ENCOUNTER — Encounter (HOSPITAL_COMMUNITY): Payer: Self-pay | Admitting: Otolaryngology

## 2021-01-05 ENCOUNTER — Other Ambulatory Visit (HOSPITAL_COMMUNITY)
Admission: RE | Admit: 2021-01-05 | Discharge: 2021-01-05 | Disposition: A | Discharge status: Home / Self Care | Payer: Medicare Other | Source: Ambulatory Visit | Attending: Otolaryngology | Admitting: Otolaryngology

## 2021-01-05 ENCOUNTER — Other Ambulatory Visit: Payer: Self-pay

## 2021-01-05 DIAGNOSIS — Z01812 Encounter for preprocedural laboratory examination: Secondary | ICD-10-CM | POA: Diagnosis not present

## 2021-01-05 DIAGNOSIS — Z20822 Contact with and (suspected) exposure to covid-19: Secondary | ICD-10-CM | POA: Insufficient documentation

## 2021-01-05 LAB — SARS CORONAVIRUS 2 (TAT 6-24 HRS): SARS Coronavirus 2: NEGATIVE

## 2021-01-05 NOTE — Progress Notes (Addendum)
Charles [name] denies chest pain or shortness of breath. Patient denies any s/s of Covid in his household and is not aware of being expose d to anyone who has Covid symptoms.  Charles [name] is going to be tested for Covid today.  Charles [name] has type II diabetic, patient reports that the average CBG 140.  Last A1C was 7.3 on 12/19/20. I instructed  Charles [name] to not take Glipizide on Sunday evening or Monday am; do no take Metformin Monday am. I instructed patient to check CBG after awaking and every 2 hours until arrival  to the hospital.  I Instructed patient if CBG is less than 70 to take 4 Glucose Tablets or 1 tube of Glucose Gel or 1/2 cup of a clear juice. Recheck CBG in 15 minutes if CBG is not over 70 call, pre- op desk at [phone] for further instructions.   I received clearance from Dr. Terrence Dupont, Charles [name] was in Dr. Zenia Resides office on 11/10/20- blood pressure at that time was- 168/106 heart rate 78, repeat blood pressure was 163/76 heart rate 76.   Blood pressure on 12/26/20  At appointment with Dr. Tamala Julian, [name], was 120/90, heart rate 73.  Other blood pressures from Dr Tamala Julian appointments 09/13/20 122/72 Heart Rate 67, 10/25/20- 126/84, pulse 85.

## 2021-01-07 NOTE — Anesthesia Preprocedure Evaluation (Addendum)
Anesthesia Evaluation  Patient identified by MRN, date of birth, ID band Patient awake    Reviewed: Allergy & Precautions, NPO status , Patient's Chart, lab work & pertinent test results, reviewed documented beta blocker date and time   History of Anesthesia Complications Negative for: history of anesthetic complications  Airway Mallampati: II  TM Distance: >3 FB Neck ROM: Full    Dental  (+) Missing,    Pulmonary former smoker,    Pulmonary exam normal        Cardiovascular hypertension, Pt. on home beta blockers and Pt. on medications + CAD and + Past MI  Normal cardiovascular exam+ dysrhythmias (on Eliquis )   L heart cath 1017:  Mid RCA lesion, 20% stenosed.  Dist RCA lesion, 20% stenosed.  Dist LAD lesion, 40% stenosed.  1st Mrg lesion, 70% stenosed.  Prox Cx lesion, 60% stenosed.  Ost LAD to Prox LAD lesion, 95% stenosed. Post intervention, there is a 10% residual stenosis.  There is mild to moderate left ventricular systolic dysfunction     Neuro/Psych negative neurological ROS  negative psych ROS   GI/Hepatic Neg liver ROS, GERD  ,  Endo/Other  diabetes, Type 2, Oral Hypoglycemic Agents  Renal/GU Renal ca s/p left nephrectomy  negative genitourinary   Musculoskeletal  (+) Arthritis ,   Abdominal   Peds  Hematology negative hematology ROS (+)   Anesthesia Other Findings Day of surgery medications reviewed with patient.  Reproductive/Obstetrics negative OB ROS                            Anesthesia Physical Anesthesia Plan  ASA: II  Anesthesia Plan: General   Post-op Pain Management:    Induction: Intravenous  PONV Risk Score and Plan: 2 and Treatment may vary due to age or medical condition, Dexamethasone and Ondansetron  Airway Management Planned: Oral ETT  Additional Equipment: None  Intra-op Plan:   Post-operative Plan: Extubation in OR  Informed  Consent: I have reviewed the patients History and Physical, chart, labs and discussed the procedure including the risks, benefits and alternatives for the proposed anesthesia with the patient or authorized representative who has indicated his/her understanding and acceptance.     Dental advisory given  Plan Discussed with: CRNA  Anesthesia Plan Comments:        Anesthesia Quick Evaluation

## 2021-01-08 ENCOUNTER — Observation Stay (HOSPITAL_COMMUNITY)
Admission: RE | Admit: 2021-01-08 | Discharge: 2021-01-09 | Disposition: A | Discharge status: Home / Self Care | Payer: Medicare Other | Attending: Otolaryngology | Admitting: Otolaryngology

## 2021-01-08 ENCOUNTER — Inpatient Hospital Stay (HOSPITAL_COMMUNITY): Payer: Medicare Other | Admitting: Anesthesiology

## 2021-01-08 ENCOUNTER — Other Ambulatory Visit: Payer: Self-pay

## 2021-01-08 ENCOUNTER — Encounter (HOSPITAL_COMMUNITY)
Admission: RE | Disposition: A | Discharge status: Home / Self Care | Payer: Self-pay | Source: Home / Self Care | Attending: Otolaryngology

## 2021-01-08 ENCOUNTER — Encounter (HOSPITAL_COMMUNITY): Payer: Self-pay | Admitting: Otolaryngology

## 2021-01-08 DIAGNOSIS — I10 Essential (primary) hypertension: Secondary | ICD-10-CM | POA: Insufficient documentation

## 2021-01-08 DIAGNOSIS — D490 Neoplasm of unspecified behavior of digestive system: Secondary | ICD-10-CM | POA: Diagnosis not present

## 2021-01-08 DIAGNOSIS — Z79899 Other long term (current) drug therapy: Secondary | ICD-10-CM | POA: Insufficient documentation

## 2021-01-08 DIAGNOSIS — Z85528 Personal history of other malignant neoplasm of kidney: Secondary | ICD-10-CM | POA: Insufficient documentation

## 2021-01-08 DIAGNOSIS — Z7984 Long term (current) use of oral hypoglycemic drugs: Secondary | ICD-10-CM | POA: Diagnosis not present

## 2021-01-08 DIAGNOSIS — E119 Type 2 diabetes mellitus without complications: Secondary | ICD-10-CM | POA: Insufficient documentation

## 2021-01-08 DIAGNOSIS — E875 Hyperkalemia: Secondary | ICD-10-CM | POA: Diagnosis not present

## 2021-01-08 DIAGNOSIS — D62 Acute posthemorrhagic anemia: Secondary | ICD-10-CM | POA: Diagnosis not present

## 2021-01-08 DIAGNOSIS — I2510 Atherosclerotic heart disease of native coronary artery without angina pectoris: Secondary | ICD-10-CM | POA: Insufficient documentation

## 2021-01-08 DIAGNOSIS — Z7982 Long term (current) use of aspirin: Secondary | ICD-10-CM | POA: Insufficient documentation

## 2021-01-08 DIAGNOSIS — Z87891 Personal history of nicotine dependence: Secondary | ICD-10-CM | POA: Diagnosis not present

## 2021-01-08 DIAGNOSIS — Z7901 Long term (current) use of anticoagulants: Secondary | ICD-10-CM | POA: Diagnosis not present

## 2021-01-08 DIAGNOSIS — I480 Paroxysmal atrial fibrillation: Secondary | ICD-10-CM | POA: Insufficient documentation

## 2021-01-08 DIAGNOSIS — R221 Localized swelling, mass and lump, neck: Secondary | ICD-10-CM | POA: Diagnosis present

## 2021-01-08 DIAGNOSIS — D119 Benign neoplasm of major salivary gland, unspecified: Principal | ICD-10-CM | POA: Insufficient documentation

## 2021-01-08 DIAGNOSIS — I214 Non-ST elevation (NSTEMI) myocardial infarction: Secondary | ICD-10-CM | POA: Diagnosis not present

## 2021-01-08 HISTORY — DX: Personal history of other medical treatment: Z92.89

## 2021-01-08 HISTORY — DX: Cardiac arrhythmia, unspecified: I49.9

## 2021-01-08 HISTORY — PX: EXCISION MASS NECK: SHX6703

## 2021-01-08 HISTORY — DX: Polyneuropathy, unspecified: G62.9

## 2021-01-08 HISTORY — DX: Acute myocardial infarction, unspecified: I21.9

## 2021-01-08 LAB — CBC
HCT: 41.6 % (ref 39.0–52.0)
Hemoglobin: 13.4 g/dL (ref 13.0–17.0)
MCH: 29.7 pg (ref 26.0–34.0)
MCHC: 32.2 g/dL (ref 30.0–36.0)
MCV: 92.2 fL (ref 80.0–100.0)
Platelets: 211 10*3/uL (ref 150–400)
RBC: 4.51 MIL/uL (ref 4.22–5.81)
RDW: 14.7 % (ref 11.5–15.5)
WBC: 4.7 10*3/uL (ref 4.0–10.5)
nRBC: 0 % (ref 0.0–0.2)

## 2021-01-08 LAB — BASIC METABOLIC PANEL
Anion gap: 11 (ref 5–15)
BUN: 18 mg/dL (ref 8–23)
CO2: 25 mmol/L (ref 22–32)
Calcium: 9 mg/dL (ref 8.9–10.3)
Chloride: 101 mmol/L (ref 98–111)
Creatinine, Ser: 1.08 mg/dL (ref 0.61–1.24)
GFR, Estimated: >60 mL/min (ref >60)
Glucose, Bld: 168 mg/dL — ABNORMAL HIGH (ref 70–99)
Potassium: 4.6 mmol/L (ref 3.5–5.1)
Sodium: 137 mmol/L (ref 135–145)

## 2021-01-08 LAB — GLUCOSE, CAPILLARY
Glucose-Capillary: 175 mg/dL — ABNORMAL HIGH (ref 70–99)
Glucose-Capillary: 176 mg/dL — ABNORMAL HIGH (ref 70–99)
Glucose-Capillary: 198 mg/dL — ABNORMAL HIGH (ref 70–99)
Glucose-Capillary: 350 mg/dL — ABNORMAL HIGH (ref 70–99)
Glucose-Capillary: 238 mg/dL — ABNORMAL HIGH (ref 70–99)

## 2021-01-08 SURGERY — EXCISION, MASS, NECK
Anesthesia: General | Site: Neck | Laterality: Left

## 2021-01-08 MED ORDER — CHLORHEXIDINE GLUCONATE 0.12 % MT SOLN
15.0000 mL | Freq: Once | OROMUCOSAL | Status: AC
  Administered 2021-01-08: 15 mL via OROMUCOSAL
  Filled 2021-01-08: qty 15

## 2021-01-08 MED ORDER — LIDOCAINE 2% (20 MG/ML) 5 ML SYRINGE
INTRAMUSCULAR | Status: DC | PRN
  Administered 2021-01-08: 80 mg via INTRAVENOUS

## 2021-01-08 MED ORDER — SPIRONOLACTONE 25 MG PO TABS
25.0000 mg | ORAL_TABLET | Freq: Every day | ORAL | Status: DC
  Administered 2021-01-09: 25 mg via ORAL
  Filled 2021-01-08: qty 1

## 2021-01-08 MED ORDER — ORAL CARE MOUTH RINSE: 15.0000 mL | Freq: Once | OROMUCOSAL | Status: AC

## 2021-01-08 MED ORDER — LIDOCAINE-EPINEPHRINE 1 %-1:100000 IJ SOLN
INTRAMUSCULAR | Status: DC | PRN
  Administered 2021-01-08: 2.5 mL

## 2021-01-08 MED ORDER — GABAPENTIN 100 MG PO CAPS
100.0000 mg | ORAL_CAPSULE | Freq: Every day | ORAL | Status: DC
  Administered 2021-01-08: 100 mg via ORAL
  Filled 2021-01-08: qty 1

## 2021-01-08 MED ORDER — BACITRACIN ZINC 500 UNIT/GM EX OINT
TOPICAL_OINTMENT | CUTANEOUS | Status: AC
  Filled 2021-01-08: qty 28.35

## 2021-01-08 MED ORDER — PROPOFOL 10 MG/ML IV BOLUS
INTRAVENOUS | Status: DC | PRN
  Administered 2021-01-08: 170 mg via INTRAVENOUS

## 2021-01-08 MED ORDER — LIDOCAINE-EPINEPHRINE 1 %-1:100000 IJ SOLN
INTRAMUSCULAR | Status: AC
  Filled 2021-01-08: qty 1

## 2021-01-08 MED ORDER — INSULIN ASPART 100 UNIT/ML IJ SOLN
0.0000 [IU] | Freq: Three times a day (TID) | INTRAMUSCULAR | Status: DC
  Administered 2021-01-08: 11 [IU] via SUBCUTANEOUS
  Administered 2021-01-09: 2 [IU] via SUBCUTANEOUS

## 2021-01-08 MED ORDER — PHENYLEPHRINE HCL-NACL 10-0.9 MG/250ML-% IV SOLN
INTRAVENOUS | Status: DC | PRN
  Administered 2021-01-08: 30 ug/min via INTRAVENOUS

## 2021-01-08 MED ORDER — METFORMIN HCL 500 MG PO TABS
1000.0000 mg | ORAL_TABLET | Freq: Two times a day (BID) | ORAL | Status: DC
  Administered 2021-01-08 – 2021-01-09 (×2): 1000 mg via ORAL
  Filled 2021-01-08 (×2): qty 2

## 2021-01-08 MED ORDER — LISINOPRIL 20 MG PO TABS
20.0000 mg | ORAL_TABLET | Freq: Every day | ORAL | Status: DC
  Administered 2021-01-09: 20 mg via ORAL
  Filled 2021-01-08: qty 1

## 2021-01-08 MED ORDER — VITAMIN D 25 MCG (1000 UNIT) PO TABS
5000.0000 [IU] | ORAL_TABLET | Freq: Every day | ORAL | Status: DC
  Administered 2021-01-08 – 2021-01-09 (×2): 5000 [IU] via ORAL
  Filled 2021-01-08 (×2): qty 5

## 2021-01-08 MED ORDER — CEFAZOLIN SODIUM-DEXTROSE 2-4 GM/100ML-% IV SOLN
INTRAVENOUS | Status: AC
  Filled 2021-01-08: qty 100

## 2021-01-08 MED ORDER — GLIPIZIDE 5 MG PO TABS
5.0000 mg | ORAL_TABLET | Freq: Two times a day (BID) | ORAL | Status: DC
  Administered 2021-01-08 – 2021-01-09 (×2): 5 mg via ORAL
  Filled 2021-01-08 (×2): qty 1

## 2021-01-08 MED ORDER — PROMETHAZINE HCL 25 MG/ML IJ SOLN
6.2500 mg | INTRAMUSCULAR | Status: DC | PRN
Start: 2021-01-08 — End: 2021-01-08

## 2021-01-08 MED ORDER — BACITRACIN ZINC 500 UNIT/GM EX OINT
1.0000 "application " | TOPICAL_OINTMENT | Freq: Three times a day (TID) | CUTANEOUS | Status: DC
  Administered 2021-01-08 – 2021-01-09 (×3): 1 via TOPICAL
  Filled 2021-01-08 (×3): qty 28.35

## 2021-01-08 MED ORDER — FENTANYL CITRATE (PF) 100 MCG/2ML IJ SOLN: 25.0000 ug | INTRAMUSCULAR | Status: DC | PRN

## 2021-01-08 MED ORDER — 0.9 % SODIUM CHLORIDE (POUR BTL) OPTIME
TOPICAL | Status: DC | PRN
  Administered 2021-01-08: 1000 mL

## 2021-01-08 MED ORDER — ASPIRIN 81 MG PO CHEW
81.0000 mg | CHEWABLE_TABLET | Freq: Every day | ORAL | Status: DC
  Administered 2021-01-09: 81 mg via ORAL
  Filled 2021-01-08: qty 1

## 2021-01-08 MED ORDER — ONDANSETRON HCL 4 MG/2ML IJ SOLN: 4.0000 mg | INTRAMUSCULAR | Status: DC | PRN

## 2021-01-08 MED ORDER — TAMSULOSIN HCL 0.4 MG PO CAPS
0.4000 mg | ORAL_CAPSULE | Freq: Every day | ORAL | Status: DC
  Administered 2021-01-09: 0.4 mg via ORAL
  Filled 2021-01-08: qty 1

## 2021-01-08 MED ORDER — ONDANSETRON HCL 4 MG/2ML IJ SOLN
INTRAMUSCULAR | Status: DC | PRN
  Administered 2021-01-08: 4 mg via INTRAVENOUS

## 2021-01-08 MED ORDER — PHENYLEPHRINE 40 MCG/ML (10ML) SYRINGE FOR IV PUSH (FOR BLOOD PRESSURE SUPPORT)
PREFILLED_SYRINGE | INTRAVENOUS | Status: DC | PRN
  Administered 2021-01-08: 120 ug via INTRAVENOUS
  Administered 2021-01-08 (×2): 80 ug via INTRAVENOUS

## 2021-01-08 MED ORDER — DEXAMETHASONE SODIUM PHOSPHATE 10 MG/ML IJ SOLN
INTRAMUSCULAR | Status: DC | PRN
  Administered 2021-01-08: 10 mg via INTRAVENOUS

## 2021-01-08 MED ORDER — METOPROLOL SUCCINATE ER 100 MG PO TB24
100.0000 mg | ORAL_TABLET | Freq: Every day | ORAL | Status: DC
  Administered 2021-01-09: 100 mg via ORAL
  Filled 2021-01-08: qty 1

## 2021-01-08 MED ORDER — OXYCODONE HCL 5 MG PO TABS
5.0000 mg | ORAL_TABLET | Freq: Once | ORAL | Status: DC | PRN
Start: 2021-01-08 — End: 2021-01-08

## 2021-01-08 MED ORDER — METOPROLOL SUCCINATE ER 25 MG PO TB24: 1000.0000 mg | ORAL_TABLET | Freq: Every day | ORAL | Status: DC

## 2021-01-08 MED ORDER — ROCURONIUM BROMIDE 10 MG/ML (PF) SYRINGE
PREFILLED_SYRINGE | INTRAVENOUS | Status: DC | PRN
  Administered 2021-01-08: 60 mg via INTRAVENOUS

## 2021-01-08 MED ORDER — METOPROLOL SUCCINATE ER 25 MG PO TB24
50.0000 mg | ORAL_TABLET | Freq: Every day | ORAL | Status: DC
  Administered 2021-01-08: 50 mg via ORAL
  Filled 2021-01-08: qty 2

## 2021-01-08 MED ORDER — INSULIN ASPART 100 UNIT/ML IJ SOLN
0.0000 [IU] | Freq: Every day | INTRAMUSCULAR | Status: DC
  Administered 2021-01-08: 2 [IU] via SUBCUTANEOUS

## 2021-01-08 MED ORDER — LACTATED RINGERS IV SOLN: INTRAVENOUS | Status: DC

## 2021-01-08 MED ORDER — HYDROCODONE-ACETAMINOPHEN 5-325 MG PO TABS: 1.0000 | ORAL_TABLET | ORAL | Status: DC | PRN

## 2021-01-08 MED ORDER — ATORVASTATIN CALCIUM 40 MG PO TABS
40.0000 mg | ORAL_TABLET | Freq: Every day | ORAL | Status: DC
  Administered 2021-01-08: 40 mg via ORAL
  Filled 2021-01-08: qty 1

## 2021-01-08 MED ORDER — CEFAZOLIN SODIUM-DEXTROSE 1-4 GM/50ML-% IV SOLN
1.0000 g | Freq: Three times a day (TID) | INTRAVENOUS | Status: AC
  Administered 2021-01-08 – 2021-01-09 (×3): 1 g via INTRAVENOUS
  Filled 2021-01-08 (×4): qty 50

## 2021-01-08 MED ORDER — POTASSIUM CHLORIDE IN NACL 20-0.45 MEQ/L-% IV SOLN
INTRAVENOUS | Status: DC
  Administered 2021-01-08: 1000 mL via INTRAVENOUS
  Filled 2021-01-08 (×2): qty 1000

## 2021-01-08 MED ORDER — ACETAMINOPHEN 500 MG PO TABS
1000.0000 mg | ORAL_TABLET | Freq: Once | ORAL | Status: AC
  Administered 2021-01-08: 1000 mg via ORAL
  Filled 2021-01-08: qty 2

## 2021-01-08 MED ORDER — FENTANYL CITRATE (PF) 250 MCG/5ML IJ SOLN
INTRAMUSCULAR | Status: DC | PRN
  Administered 2021-01-08: 100 ug via INTRAVENOUS

## 2021-01-08 MED ORDER — MORPHINE SULFATE (PF) 2 MG/ML IV SOLN: 2.0000 mg | INTRAVENOUS | Status: DC | PRN

## 2021-01-08 MED ORDER — NITROGLYCERIN 0.4 MG SL SUBL: 0.4000 mg | SUBLINGUAL_TABLET | SUBLINGUAL | Status: DC | PRN

## 2021-01-08 MED ORDER — SUGAMMADEX SODIUM 200 MG/2ML IV SOLN
INTRAVENOUS | Status: DC | PRN
  Administered 2021-01-08: 200 mg via INTRAVENOUS

## 2021-01-08 MED ORDER — OXYCODONE HCL 5 MG/5ML PO SOLN
5.0000 mg | Freq: Once | ORAL | Status: DC | PRN
Start: 2021-01-08 — End: 2021-01-08

## 2021-01-08 MED ORDER — PROPOFOL 10 MG/ML IV BOLUS
INTRAVENOUS | Status: AC
  Filled 2021-01-08: qty 20

## 2021-01-08 MED ORDER — FENTANYL CITRATE (PF) 250 MCG/5ML IJ SOLN
INTRAMUSCULAR | Status: AC
  Filled 2021-01-08: qty 5

## 2021-01-08 MED ORDER — ONDANSETRON HCL 4 MG PO TABS: 4.0000 mg | ORAL_TABLET | ORAL | Status: DC | PRN

## 2021-01-08 MED ORDER — CEFAZOLIN SODIUM-DEXTROSE 2-4 GM/100ML-% IV SOLN
2.0000 g | Freq: Once | INTRAVENOUS | Status: AC
  Administered 2021-01-08: 2 g via INTRAVENOUS

## 2021-01-08 SURGICAL SUPPLY — 41 items
CANISTER SUCT 3000ML PPV (MISCELLANEOUS) IMPLANT
CLEANER TIP ELECTROSURG 2X2 (MISCELLANEOUS) ×2 IMPLANT
CNTNR URN SCR LID CUP LEK RST (MISCELLANEOUS) ×1 IMPLANT
CONT SPEC 4OZ STRL OR WHT (MISCELLANEOUS) ×2
CORD BIPOLAR FORCEPS 12FT (ELECTRODE) ×2 IMPLANT
COVER SURGICAL LIGHT HANDLE (MISCELLANEOUS) ×2 IMPLANT
DRAIN JP 10F RND RADIO (DRAIN) ×2 IMPLANT
DRAPE HALF SHEET 40X57 (DRAPES) ×2 IMPLANT
ELECT COATED BLADE 2.86 ST (ELECTRODE) ×2 IMPLANT
ELECT PAIRED SUBDERMAL (MISCELLANEOUS) ×2
ELECT REM PT RETURN 9FT ADLT (ELECTROSURGICAL) ×2
ELECTRODE PAIRED SUBDERMAL (MISCELLANEOUS) ×1 IMPLANT
ELECTRODE REM PT RTRN 9FT ADLT (ELECTROSURGICAL) ×1 IMPLANT
EVACUATOR SILICONE 100CC (DRAIN) ×2 IMPLANT
FORCEPS BIPOLAR SPETZLER 8 1.0 (NEUROSURGERY SUPPLIES) ×2 IMPLANT
GAUZE 4X4 16PLY RFD (DISPOSABLE) ×2 IMPLANT
GAUZE SPONGE 4X4 12PLY STRL (GAUZE/BANDAGES/DRESSINGS) IMPLANT
GLOVE BIO SURGEON STRL SZ7.5 (GLOVE) ×2 IMPLANT
GOWN STRL REUS W/ TWL LRG LVL3 (GOWN DISPOSABLE) ×2 IMPLANT
GOWN STRL REUS W/TWL LRG LVL3 (GOWN DISPOSABLE) ×4
KIT BASIN OR (CUSTOM PROCEDURE TRAY) ×2 IMPLANT
KIT TURNOVER KIT B (KITS) IMPLANT
NEEDLE HYPO 25GX1X1/2 BEV (NEEDLE) ×2 IMPLANT
NS IRRIG 1000ML POUR BTL (IV SOLUTION) ×2 IMPLANT
PAD ARMBOARD 7.5X6 YLW CONV (MISCELLANEOUS) ×4 IMPLANT
PENCIL SMOKE EVACUATOR (MISCELLANEOUS) ×2 IMPLANT
POSITIONER HEAD DONUT 9IN (MISCELLANEOUS) ×2 IMPLANT
SUT CHROMIC 4 0 P 3 18 (SUTURE) IMPLANT
SUT ETHILON 2 0 FS 18 (SUTURE) ×2 IMPLANT
SUT ETHILON 4 0 PS 2 18 (SUTURE) IMPLANT
SUT ETHILON 5 0 P 3 18 (SUTURE)
SUT NYLON ETHILON 5-0 P-3 1X18 (SUTURE) IMPLANT
SUT SILK 2 0 SH CR/8 (SUTURE) ×2 IMPLANT
SUT SILK 4 0 (SUTURE)
SUT SILK 4-0 18XBRD TIE 12 (SUTURE) IMPLANT
SUT VIC AB 3-0 FS2 27 (SUTURE) ×2 IMPLANT
SUT VIC AB 4-0 PS2 27 (SUTURE) IMPLANT
SWAB COLLECTION DEVICE MRSA (MISCELLANEOUS) IMPLANT
SWAB CULTURE ESWAB REG 1ML (MISCELLANEOUS) IMPLANT
SYR BULB IRRIG 60ML STRL (SYRINGE) ×2 IMPLANT
TRAY ENT MC OR (CUSTOM PROCEDURE TRAY) ×2 IMPLANT

## 2021-01-08 NOTE — Transfer of Care (Signed)
Immediate Anesthesia Transfer of Care Note  Patient: Charles [name]  Procedure(s) Performed: EXCISION MASS NECK (Left Neck)  Patient Location: PACU  Anesthesia Type:General  Level of Consciousness: awake, alert  and oriented  Airway & Oxygen Therapy: Patient Spontanous Breathing  Post-op Assessment: Report given to RN and Post -op Vital signs reviewed and stable  Post vital signs: Reviewed and stable  Last Vitals:  Vitals Value Taken Time  BP 107/81 01/08/21 1045  Temp    Pulse 69 01/08/21 1048  Resp 16 01/08/21 1048  SpO2 95 % 01/08/21 1048  Vitals shown include unvalidated device data.  Last Pain:  Vitals:   01/08/21 0813  TempSrc:   PainSc: 2          Complications: No complications documented.

## 2021-01-08 NOTE — Op Note (Signed)
Preop diagnosis: Left neck mass Postop diagnosis: same Procedure: Excision left neck mass Surgeon: Redmond Baseman Assist: RNFA Anesth: General and local with 1% lidocaine with 1:100,000 epinephrine Compl: None Findings: Cystic left zone 2 mass with some solid portion Description:  After discussing risks, benefits, and alternatives, the patient was brought to the operative suite and placed on the operative table in the supine position.  Anesthesia was induced and the patient was intubated by the anesthesia team without difficulty.  The nerve integrity monitor was placed in the left lower lip and turned on during the case.  The left neck incision was marked with a marking pen and injected with local anesthetic.  The neck was prepped and draped in sterile fashion.  The incision was made with a 15 blade scalpel and extended through the subcutaneous and platysma layers with Bovie electrocautery.  The sternocleidomastoid was skeletonized exposing the inferior extent of the mass.  The mass was then dissected from the SCM extending anterior and posterior.  The cyst ruptured in this process and yellow fluid drained and decompressed the mass.  The decompressed mass was easier to dissect from surrounding tissues and this was accomplished with electrocautery.  Anteriorly, the mass was separated from the submandibular gland and the specimen was passed to nursing for pathology.  An additional small node deep to the mass was removed and added to the specimen.  The wound was copiously irrigated with saline.  A 10 French suction drain was placed in the depth of the wound and secured at the skin with 2-0 Nylon with a standard drain stitch.  The platysma layer closed with 3-0 Vicryl in a simple, interrupted fashion.  The subcutaneous tissue was closed with 4-0 Vicryl.  The skin was closed with staples.  The drain was placed to bulb suction.  Bacitracin ointment was added to the incision.  Drapes were removed and the drain was secured  to the shoulder with tape.  He was then returned to anesthesia for wake-up and was extubated and moved to the recovery room in stable condition.

## 2021-01-08 NOTE — Anesthesia Postprocedure Evaluation (Signed)
Anesthesia Post Note  Patient: Charles [name]  Procedure(s) Performed: EXCISION MASS NECK (Left Neck)     Patient location during evaluation: PACU Anesthesia Type: General Level of consciousness: awake and alert and oriented Pain management: pain level controlled Vital Signs Assessment: post-procedure vital signs reviewed and stable Respiratory status: spontaneous breathing, nonlabored ventilation and respiratory function stable Cardiovascular status: blood pressure returned to baseline Postop Assessment: no apparent nausea or vomiting Anesthetic complications: no   No complications documented.  Last Vitals:  Vitals:   01/08/21 1145 01/08/21 1208  BP: 116/71 119/71  Pulse: 64 62  Resp: 15 17  Temp:  36.5 C  SpO2: 95% 94%    Last Pain:  Vitals:   01/08/21 1208  TempSrc: Oral  PainSc:                  Brennan Bailey

## 2021-01-08 NOTE — H&P (Signed)
Charles [name] is an 80 y.o. male.   Chief Complaint: Neck mass HPI: 80 year old male with left neck mass since February with inconclusive FNA.  He presents for excision.  Past Medical History:  Diagnosis Date  . Anemia    "when I was a lad"  . Arthritis    left and right arm  . AVM (arteriovenous malformation) of colon    2 - non-bleeding 2013  . Cancer of kidney ([place]) 2000   left nephrectomy  . Coronary artery disease   . Dysrhythmia    PAF  . Eczema   . GERD (gastroesophageal reflux disease)   . Hernia, ventral   . History of blood transfusion   . Hyperlipidemia   . Hypertension   . Myocardial infarction ([place])   . Neuropathy   . Obesity   . Personal history of adenomatous colonic polyps 07/20/2012   3 + adenomas 2009 07/20/2012 2 diminutive polyps    . Pneumonia 1942; 1950s X 1  . Type II diabetes mellitus ([place])    type II  . Walking pneumonia 2000's X 1    Past Surgical History:  Procedure Laterality Date  . CARDIAC CATHETERIZATION N/A 10/19/2015   Procedure: Left Heart Cath and Coronary Angiography;  Surgeon: Charolette Forward, MD;  Location: [facility];  Service: Cardiovascular;  Laterality: N/A;  . CARDIAC CATHETERIZATION N/A 10/19/2015   Procedure: Coronary Stent Intervention;  Surgeon: Charolette Forward, MD;  Location: [facility];  Service: Cardiovascular;  Laterality: N/A;  . CATARACT EXTRACTION, BILATERAL Bilateral 01/2018   Dr. Kathrin Penner did surgery and he developed MD and is seeing specialist now for MD treatment  . COLONOSCOPY     "nothing showed up this time"  . COLONOSCOPY W/ BIOPSIES AND POLYPECTOMY  X 1  . CORONARY ANGIOPLASTY    . FEMUR IM NAIL Right 07/26/2013   Procedure: INTRAMEDULLARY (IM) NAIL FEMORAL subtrochanteric;  Surgeon: Mauri Pole, MD;  Location: [facility];  Service: Orthopedics;  Laterality: Right;  . FRACTURE SURGERY    . INGUINAL HERNIA REPAIR Left >3 times  . INGUINAL HERNIA REPAIR Right 2012  . NEPHRECTOMY Left 2000  .  TONSILLECTOMY  1940s    Family History  Problem Relation Age of Onset  . Drug abuse Other   . Cancer Other   . Heart disease Other   . Lung disease Other   . Diabetes Mother   . Diabetes Sister   . Colon cancer Neg Hx   . Stomach cancer Neg Hx    Social History:  reports that he quit smoking about 52 years ago. His smoking use included cigarettes, pipe, and cigars. He quit after 20.00 years of use. He has never used smokeless tobacco. He reports current alcohol use of about 2.0 standard drinks of alcohol per week. He reports that he does not use drugs.  Allergies:  Allergies  Allergen Reactions  . Bee Venom Anaphylaxis    Medications Prior to Admission  Medication Sig Dispense Refill  . aspirin 81 MG chewable tablet Chew 81 mg by mouth in the morning.    Marland Kitchen atorvastatin (LIPITOR) 40 MG tablet Take 1 tablet (40 mg total) by mouth daily at 6 PM. 30 tablet 3  . Cholecalciferol (VITAMIN D-3) 125 MCG (5000 UT) TABS Take 5,000 Units by mouth in the morning.    . Cyanocobalamin (VITAMIN B-12) 5000 MCG SUBL Place 5,000 mcg under the tongue every 3 (three) days.    Marland Kitchen ELIQUIS 5  MG TABS tablet Take 5 mg by mouth 2 (two) times daily.    Marland Kitchen gabapentin (NEURONTIN) 100 MG capsule TAKE 1 CAPSULE BY MOUTH AT  BEDTIME (Patient taking differently: Take 100 mg by mouth at bedtime.) 90 capsule 3  . glipiZIDE (GLUCOTROL) 5 MG tablet TAKE 1 TABLET BY MOUTH 2  TIMES DAILY BEFORE A MEAL (Patient taking differently: Take 5 mg by mouth 2 (two) times daily before a meal.) 180 tablet 3  . lisinopril (ZESTRIL) 20 MG tablet Take 20 mg by mouth in the morning.    . metFORMIN (GLUCOPHAGE) 1000 MG tablet TAKE 1 TABLET BY MOUTH  TWICE DAILY WITH MEALS (Patient taking differently: Take 1,000 mg by mouth in the morning and at bedtime.) 180 tablet 3  . metoprolol succinate (TOPROL-XL) 100 MG 24 hr tablet Take 50-100 mg by mouth See admin instructions. Take 1 tablet (100 mg) gy mouth in the morning & take 0.5 tablet (50 mg)  by mouth in the evening.    . Multiple Vitamins-Minerals (PRESERVISION AREDS 2 PO) Take 1 tablet by mouth in the morning and at bedtime.    Marland Kitchen spironolactone (ALDACTONE) 25 MG tablet Take 25 mg by mouth in the morning.    . tamsulosin (FLOMAX) 0.4 MG CAPS capsule TAKE 1 CAPSULE BY MOUTH  DAILY (Patient taking differently: Take 0.4 mg by mouth in the morning.) 90 capsule 3  . tamsulosin (FLOMAX) 0.4 MG CAPS capsule Take 0.4 mg by mouth.    . EPINEPHrine (EPI-PEN) 0.3 mg/0.3 mL DEVI Inject 0.3 mg into the muscle daily as needed (allergic reaction).    . Insulin Syringes, Disposable, U-100 0.5 ML MISC Use 1x a day 100 each 3  . nitroGLYCERIN (NITROSTAT) 0.4 MG SL tablet Place 1 tablet (0.4 mg total) under the tongue every 5 (five) minutes as needed for chest pain. 25 tablet 1  . ONETOUCH DELICA LANCETS FINE MISC Use to check sugar daily 100 each 5  . ONETOUCH VERIO test strip USE 1 STRIP TO CHECK GLUCOSE ONCE DAILY 100 each 3    Results for orders placed or performed during the hospital encounter of 01/08/21 (from the past 48 hour(s))  Glucose, capillary     Status: Abnormal   Collection Time: 01/08/21  7:29 AM  Result Value Ref Range   Glucose-Capillary 175 (H) 70 - 99 mg/dL    Comment: Glucose reference range applies only to samples taken after fasting for at least 8 hours.  CBC per protocol     Status: None   Collection Time: 01/08/21  8:00 AM  Result Value Ref Range   WBC 4.7 4.0 - 10.5 K/uL   RBC 4.51 4.22 - 5.81 MIL/uL   Hemoglobin 13.4 13.0 - 17.0 g/dL   HCT 41.6 39.0 - 52.0 %   MCV 92.2 80.0 - 100.0 fL   MCH 29.7 26.0 - 34.0 pg   MCHC 32.2 30.0 - 36.0 g/dL   RDW 14.7 11.5 - 15.5 %   Platelets 211 150 - 400 K/uL   nRBC 0.0 0.0 - 0.2 %    Comment: Performed at [facility], [place] [address], [place], [place] [address]   No results found.  Review of Systems  All other systems reviewed and are negative.   Blood pressure (!) 148/88, pulse 66, temperature 97.7 F (36.5 C),  temperature source Oral, resp. rate 18, height 5\' 11"  (1.803 m), weight 111.7 kg, SpO2 98 %. Physical Exam Constitutional:      Appearance: Normal  appearance. He is normal weight.  HENT:     Head: Normocephalic and atraumatic.     Right Ear: External ear normal.     Left Ear: External ear normal.     Nose: Nose normal.     Mouth/Throat:     Mouth: Mucous membranes are moist.     Pharynx: Oropharynx is clear.  Eyes:     Extraocular Movements: Extraocular movements intact.     Conjunctiva/sclera: Conjunctivae normal.     Pupils: Pupils are equal, round, and reactive to light.  Neck:     Comments: Left zone 2 soft mass Cardiovascular:     Rate and Rhythm: Normal rate.  Pulmonary:     Effort: Pulmonary effort is normal.  Musculoskeletal:     Cervical back: Normal range of motion.  Skin:    General: Skin is warm and dry.  Neurological:     General: No focal deficit present.     Mental Status: He is alert and oriented to person, place, and time.  Psychiatric:        Mood and Affect: Mood normal.        Behavior: Behavior normal.        Thought Content: Thought content normal.        Judgment: Judgment normal.      Assessment/Plan Left neck mass  To OR for excision of left neck mass.  Melida Quitter, MD 01/08/2021, 9:00 AM

## 2021-01-08 NOTE — Brief Op Note (Signed)
01/08/2021  10:32 AM  PATIENT:  Charles [name]  80 y.o. male  PRE-OPERATIVE DIAGNOSIS:  LEFT NECK MASS  POST-OPERATIVE DIAGNOSIS:  LEFT NECK MASS  PROCEDURE:  Procedure(s): EXCISION MASS NECK (Left)  SURGEON:  Surgeon(s) and Role:    Melida Quitter, MD - Primary  PHYSICIAN ASSISTANT:   ASSISTANTS: none   ANESTHESIA:   general  EBL:  50 mL   BLOOD ADMINISTERED:none  DRAINS: (10 Fr) Jackson-Pratt drain(s) with closed bulb suction in the left neck   LOCAL MEDICATIONS USED:  LIDOCAINE   SPECIMEN:  Source of Specimen:  left neck mass  DISPOSITION OF SPECIMEN:  PATHOLOGY  COUNTS:  YES  TOURNIQUET:  * No tourniquets in log *  DICTATION: .Note written in EPIC  PLAN OF CARE: Admit for overnight observation  PATIENT DISPOSITION:  PACU - hemodynamically stable.   Delay start of Pharmacological VTE agent (>24hrs) due to surgical blood loss or risk of bleeding: no

## 2021-01-08 NOTE — Anesthesia Procedure Notes (Signed)
Procedure Name: Intubation Date/Time: 01/08/2021 9:23 AM Performed by: Griffin Dakin, CRNA Pre-anesthesia Checklist: Patient identified, Emergency Drugs available, Suction available and Patient being monitored Patient Re-evaluated:Patient Re-evaluated prior to induction Oxygen Delivery Method: Circle system utilized Preoxygenation: Pre-oxygenation with 100% oxygen Induction Type: IV induction Ventilation: Oral airway inserted - appropriate to patient size and Two handed mask ventilation required Laryngoscope Size: Mac and 4 Grade View: Grade III Tube type: Oral Tube size: 7.5 mm Number of attempts: 1 Airway Equipment and Method: Stylet and Oral airway Placement Confirmation: ETT inserted through vocal cords under direct vision,  positive ETCO2 and breath sounds checked- equal and bilateral Secured at: 23 cm Tube secured with: Tape Dental Injury: Teeth and Oropharynx as per pre-operative assessment

## 2021-01-08 NOTE — Progress Notes (Signed)
ENT Post Operative Note  Subjective: Patient seen and examined at bedside. Resting comfortably. He denies any pain over surgical site. He is tolerating a diet without nausea, vomiting, dysphagia, odynophagia. He is voiding without issue.   Vitals:   01/08/21 1145 01/08/21 1208  BP: 116/71 119/71  Pulse: 64 62  Resp: 15 17  Temp:  97.7 F (36.5 C)  SpO2: 95% 94%     OBJECTIVE  Gen: alert, cooperative, appropriate Head/ENT: EOMI, mucus membranes moist and pink, conjunctiva clear Left neck incision with stable line intact, surrounding area soft with no evidence of seroma or hematoma. Drain exiting inferiorly with <5cc sanguinous drainage  Face moves symmetrically Respiratory: Voice without dysphonia. non-labored breathing, no accessory muscle use, normal HR, good O2 saturations  ASSESSMENT/ PLAN  Charles [name] is a 80 y.o. male who is POD #0 s/p excision of left neck mass  -Continue pain control with PRN Norco -Continue Ancef for surgical prophylaxis -Continue home meds -Continue Bacitracin to surgical incision TID -Empty, record output and recharge JP drain -Encourage ambulation to tolerance with assist -Continue observation  Please do not hesitate to contact me with any questions or concerns.   Jason Coop, [facility] Cell: [phone]

## 2021-01-08 NOTE — Progress Notes (Signed)
Patient arrived to unit; orders released, skin assessed with Joaquim Lai, Agricultural consultant. Denies pain at this time. Family at bedside. Patient alert and oriented. Carb mod diet, patient has ordered lunch tray. Is due to void, urinal given.

## 2021-01-09 ENCOUNTER — Encounter (HOSPITAL_COMMUNITY): Payer: Self-pay | Admitting: Otolaryngology

## 2021-01-09 DIAGNOSIS — Z7901 Long term (current) use of anticoagulants: Secondary | ICD-10-CM | POA: Diagnosis not present

## 2021-01-09 DIAGNOSIS — E119 Type 2 diabetes mellitus without complications: Secondary | ICD-10-CM | POA: Diagnosis not present

## 2021-01-09 DIAGNOSIS — Z7984 Long term (current) use of oral hypoglycemic drugs: Secondary | ICD-10-CM | POA: Diagnosis not present

## 2021-01-09 DIAGNOSIS — I2510 Atherosclerotic heart disease of native coronary artery without angina pectoris: Secondary | ICD-10-CM | POA: Diagnosis not present

## 2021-01-09 DIAGNOSIS — Z87891 Personal history of nicotine dependence: Secondary | ICD-10-CM | POA: Diagnosis not present

## 2021-01-09 DIAGNOSIS — I10 Essential (primary) hypertension: Secondary | ICD-10-CM | POA: Diagnosis not present

## 2021-01-09 DIAGNOSIS — Z79899 Other long term (current) drug therapy: Secondary | ICD-10-CM | POA: Diagnosis not present

## 2021-01-09 DIAGNOSIS — Z85528 Personal history of other malignant neoplasm of kidney: Secondary | ICD-10-CM | POA: Diagnosis not present

## 2021-01-09 DIAGNOSIS — I480 Paroxysmal atrial fibrillation: Secondary | ICD-10-CM | POA: Diagnosis not present

## 2021-01-09 DIAGNOSIS — Z7982 Long term (current) use of aspirin: Secondary | ICD-10-CM | POA: Diagnosis not present

## 2021-01-09 DIAGNOSIS — D119 Benign neoplasm of major salivary gland, unspecified: Secondary | ICD-10-CM | POA: Diagnosis not present

## 2021-01-09 LAB — SURGICAL PATHOLOGY

## 2021-01-09 LAB — GLUCOSE, CAPILLARY: Glucose-Capillary: 145 mg/dL — ABNORMAL HIGH (ref 70–99)

## 2021-01-09 NOTE — Progress Notes (Signed)
Discharge instructions (including medications) discussed with and copy provided to patient/caregiver 

## 2021-01-09 NOTE — Plan of Care (Signed)
  Problem: Education: Goal: Knowledge of General Education information will improve Description: Including pain rating scale, medication(s)/side effects and non-pharmacologic comfort measures Outcome: Adequate for Discharge   

## 2021-01-09 NOTE — Discharge Summary (Signed)
Physician Discharge Summary  Patient ID: Charles [name] MRN: [id number] DOB/AGE: 80-Jan-1942 80 y.o.  Admit date: 01/08/2021 Discharge date: 01/09/2021  Admission Diagnoses: Neck mass  Discharge Diagnoses:  Active Problems:   Neck mass   Discharged Condition: good  Hospital Course: 80 year old male with left neck mass that rapidly increased in size in February.  Needle aspiration demonstrated fluid but was inconclusive.  He presented for surgical management.  See operative note.  He was observed overnight after surgery with a drain in place and did very well.  He is felt stable for discharge home on POD 1.  Consults: None  Significant Diagnostic Studies: None  Treatments: surgery: Excision left neck mass  Discharge Exam: Blood pressure 129/80, pulse 72, temperature 98.1 F (36.7 C), temperature source Oral, resp. rate 17, height 5\' 11"  (1.803 m), weight 111.7 kg, SpO2 93 %. General appearance: alert, cooperative and no distress Neck: left neck incision clean and intact, no fluid collection, drain removed, CN VII normal  Disposition: Discharge disposition: 01-Home or Self Care       Discharge Instructions    Diet - low sodium heart healthy   Complete by: As directed    Discharge instructions   Complete by: As directed    OK to shower incision, gently pat dry.  Apply antibiotic ointment twice daily.  Avoid strenuous activity.  Normal diet.  Tylenol and/or Motrin for pain.  Resume Eliquis tomorrow.   Increase activity slowly   Complete by: As directed    No wound care   Complete by: As directed      Allergies as of 01/09/2021      Reactions   Bee Venom Anaphylaxis      Medication List    TAKE these medications   aspirin 81 MG chewable tablet Chew 81 mg by mouth in the morning.   atorvastatin 40 MG tablet Commonly known as: LIPITOR Take 1 tablet (40 mg total) by mouth daily at 6 PM.   Eliquis 5 MG Tabs tablet Generic drug: apixaban Take 5 mg by mouth 2 (two)  times daily.   EPINEPHrine 0.3 mg/0.3 mL Devi Commonly known as: EPI-PEN Inject 0.3 mg into the muscle daily as needed (allergic reaction).   gabapentin 100 MG capsule Commonly known as: NEURONTIN TAKE 1 CAPSULE BY MOUTH AT  BEDTIME   glipiZIDE 5 MG tablet Commonly known as: GLUCOTROL TAKE 1 TABLET BY MOUTH 2  TIMES DAILY BEFORE A MEAL What changed: See the new instructions.   Insulin Syringes (Disposable) U-100 0.5 ML Misc Use 1x a day   lisinopril 20 MG tablet Commonly known as: ZESTRIL Take 20 mg by mouth in the morning.   metFORMIN 1000 MG tablet Commonly known as: GLUCOPHAGE TAKE 1 TABLET BY MOUTH  TWICE DAILY WITH MEALS What changed: when to take this   metoprolol succinate 100 MG 24 hr tablet Commonly known as: TOPROL-XL Take 50-100 mg by mouth See admin instructions. Take 1 tablet (100 mg) gy mouth in the morning & take 0.5 tablet (50 mg) by mouth in the evening.   nitroGLYCERIN 0.4 MG SL tablet Commonly known as: NITROSTAT Place 1 tablet (0.4 mg total) under the tongue every 5 (five) minutes as needed for chest pain.   OneTouch Delica Lancets Fine Misc Use to check sugar daily   OneTouch Verio test strip Generic drug: glucose blood USE 1 STRIP TO CHECK GLUCOSE ONCE DAILY   PRESERVISION AREDS 2 PO Take 1 tablet by mouth in the morning  and at bedtime.   spironolactone 25 MG tablet Commonly known as: ALDACTONE Take 25 mg by mouth in the morning.   tamsulosin 0.4 MG Caps capsule Commonly known as: FLOMAX Take 0.4 mg by mouth. What changed: Another medication with the same name was changed. Make sure you understand how and when to take each.   tamsulosin 0.4 MG Caps capsule Commonly known as: FLOMAX TAKE 1 CAPSULE BY MOUTH  DAILY What changed: when to take this   Vitamin B-12 5000 MCG Subl Place 5,000 mcg under the tongue every 3 (three) days.   Vitamin D-3 125 MCG (5000 UT) Tabs Take 5,000 Units by mouth in the morning.       Follow-up  Information    Melida Quitter, MD. Schedule an appointment as soon as possible for a visit in 1 week(s).   Specialty: Otolaryngology Contact information: [address] [place] [address] [phone]               Signed: Melida Quitter 01/09/2021, 8:26 AM

## 2021-01-11 ENCOUNTER — Telehealth: Payer: Self-pay | Admitting: Pharmacist

## 2021-01-11 NOTE — Chronic Care Management (AMB) (Signed)
    Chronic Care Management Pharmacy Assistant   Name: Charles [name]  MRN: [id number] DOB: 1941/07/25   Left voicemail reminding patient of appointment with Jeni Salles clinical pharmacist on 01/12/21 at 8:30am by telephone. Advised patient to have all medications close by as well as any blood pressure or blood glucose readings.   [name] [phone]

## 2021-01-12 ENCOUNTER — Ambulatory Visit (INDEPENDENT_AMBULATORY_CARE_PROVIDER_SITE_OTHER): Payer: Medicare Other | Admitting: Pharmacist

## 2021-01-12 DIAGNOSIS — I4891 Unspecified atrial fibrillation: Secondary | ICD-10-CM | POA: Diagnosis not present

## 2021-01-12 DIAGNOSIS — E1159 Type 2 diabetes mellitus with other circulatory complications: Secondary | ICD-10-CM

## 2021-01-12 DIAGNOSIS — E1165 Type 2 diabetes mellitus with hyperglycemia: Secondary | ICD-10-CM | POA: Diagnosis not present

## 2021-01-12 DIAGNOSIS — I10 Essential (primary) hypertension: Secondary | ICD-10-CM | POA: Diagnosis not present

## 2021-01-12 NOTE — Progress Notes (Signed)
Chronic Care Management Pharmacy Note  02/04/2021 Name:  Charles [name] MRN:  [id number] DOB:  1941/07/17  Subjective: Charles [name] is an 80 y.o. year old male who is a primary patient of [name], NP.  The CCM team was consulted for assistance with disease management and care coordination needs.    Engaged with patient by telephone for follow up visit in response to provider referral for pharmacy case management and/or care coordination services.   Consent to Services:  The patient was given information about Chronic Care Management services, agreed to services, and gave verbal consent prior to initiation of services.  Please see initial visit note for detailed documentation.   Patient Care Team: [name], NP as PCP - General (Family Medicine) Melida Quitter, MD as Consulting Physician (Otolaryngology) Charolette Forward, MD as Consulting Physician (Cardiology) Philemon Kingdom, MD as Consulting Physician (Internal Medicine) Viona Gilmore, [facility] as Pharmacist (Pharmacist) Dimitri Ped, RN as Case Manager  Recent office visits: 12/20/20 Charlott Rakes, LPN: Patient presented for AWV.  10/06/20 [name], NP: Patient presented for annual exam. Recommended vitamin D 5000 units daily and referral placed for dermatology.  Recent consult visits: 01/06/21 Hulan Saas, DO (Sports Medicine): Patient presented for parotid mass and hip pain follow up.   12/20/20 Philemon Kingdom, MD (endo): Patient presented for DM follow up. A1c improved at 7.3%. Recommended for him to switch vit B12 to 1000 mcg daily to replaced 5000 mcg every 3 days. Recommended to reach out post surgery if having low BGs.  Hospital visits: 5/2-01/09/21 Patient admitted for neck mass excision.  Objective:  Lab Results  Component Value Date   CREATININE 1.08 01/08/2021   BUN 18 01/08/2021   GFR 65.23 10/25/2020   GFRNONAA >60 01/08/2021   GFRAA 53 (L) 09/29/2018   NA 137 01/08/2021   K 4.6  01/08/2021   CALCIUM 9.0 01/08/2021   CO2 25 01/08/2021   GLUCOSE 168 (H) 01/08/2021    Lab Results  Component Value Date/Time   HGBA1C 7.3 (A) 12/19/2020 11:20 AM   HGBA1C 7.9 (A) 08/21/2020 08:38 AM   HGBA1C 7.9 (H) 09/29/2018 02:23 AM   HGBA1C 6.9 (H) 08/08/2016 08:56 AM   GFR 65.23 10/25/2020 10:19 AM   GFR 66.74 10/06/2020 09:40 AM   MICROALBUR 13.8 (H) 08/08/2016 08:56 AM   MICROALBUR 11.4 (H) 07/21/2015 08:37 AM    Last diabetic Eye exam:  Lab Results  Component Value Date/Time   HMDIABEYEEXA No Retinopathy 11/12/2017 12:00 AM    Last diabetic Foot exam:  Lab Results  Component Value Date/Time   HMDIABFOOTEX yes 11/24/2009 12:00 AM     Lab Results  Component Value Date   CHOL 121 10/06/2020   HDL 46.20 10/06/2020   LDLCALC 56 10/06/2020   LDLDIRECT 139.8 07/08/2014   TRIG 92.0 10/06/2020   CHOLHDL 3 10/06/2020    Hepatic Function Latest Ref Rng & Units 10/25/2020 10/06/2020 10/08/2019  Total Protein 6.0 - 8.3 g/dL 6.5 6.6 6.9  Albumin 3.5 - 5.2 g/dL 4.1 4.1 4.3  AST 0 - 37 U/L 23 23 26   ALT 0 - 53 U/L 21 22 29   Alk Phosphatase 39 - 117 U/L 75 72 77  Total Bilirubin 0.2 - 1.2 mg/dL 0.6 0.6 0.6  Bilirubin, Direct 0.0 - 0.3 mg/dL - - -    Lab Results  Component Value Date/Time   TSH 2.94 10/06/2020 09:40 AM   TSH 2.55 10/08/2019 09:49 AM    CBC Latest Ref  Rng & Units 01/08/2021 10/25/2020 10/06/2020  WBC 4.0 - 10.5 K/uL 4.7 4.6 4.6  Hemoglobin 13.0 - 17.0 g/dL 13.4 12.1(L) 12.8(L)  Hematocrit 39.0 - 52.0 % 41.6 37.7(L) 39.1  Platelets 150 - 400 K/uL 211 208.0 207.0    Lab Results  Component Value Date/Time   VD25OH 19.55 (L) 10/06/2020 09:40 AM    Clinical ASCVD: Yes  The ASCVD Risk score Mikey Bussing DC Jr., et al., 2013) failed to calculate for the following reasons:   The patient has a prior MI or stroke diagnosis    Depression screen [facility] 2/9 12/20/2020 10/18/2020 10/06/2020  Decreased Interest 0 0 0  Down, Depressed, Hopeless 0 - 0  PHQ - 2 Score 0 0 0   Some recent data might be hidden     CHA2DS2/VAS Stroke Risk Points  Current as of 2 minutes ago     5 >= 2 Points: High Risk  1 - 1.99 Points: Medium Risk  0 Points: Low Risk    No Change      Details    This score determines the patient's risk of having a stroke if the  patient has atrial fibrillation.       Points Metrics  0 Has Congestive Heart Failure:  No    Current as of 2 minutes ago  1 Has Vascular Disease:  Yes     Current as of 2 minutes ago  1 Has Hypertension:  Yes    Current as of 2 minutes ago  2 Age:  52    Current as of 2 minutes ago  1 Has Diabetes:  Yes    Current as of 2 minutes ago  0 Had Stroke:  No  Had TIA:  No  Had Thromboembolism:  No    Current as of 2 minutes ago  0 Male:  No    Current as of 2 minutes ago       Social History   Tobacco Use  Smoking Status Former Smoker  . Years: 20.00  . Types: Cigarettes, Pipe, Cigars  . Quit date: 09/09/1968  . Years since quitting: 52.4  Smokeless Tobacco Never Used  Tobacco Comment   smoking cigars- quit in 2006  , quit pipe 2000   BP Readings from Last 3 Encounters:  01/09/21 129/80  12/26/20 120/90  12/19/20 128/88   Pulse Readings from Last 3 Encounters:  01/09/21 72  12/26/20 73  12/19/20 76   Wt Readings from Last 3 Encounters:  01/08/21 246 lb 4.8 oz (111.7 kg)  12/26/20 256 lb (116.1 kg)  12/19/20 250 lb 6.4 oz (113.6 kg)   BMI Readings from Last 3 Encounters:  01/08/21 34.35 kg/m  12/26/20 35.70 kg/m  12/19/20 34.92 kg/m    Assessment/Interventions: Review of patient past medical history, allergies, medications, health status, including review of consultants reports, laboratory and other test data, was performed as part of comprehensive evaluation and provision of chronic care management services.   SDOH:  (Social Determinants of Health) assessments and interventions performed: No  SDOH Screenings   Alcohol Screen: Low Risk   . Last Alcohol Screening Score (AUDIT): 2   Depression (PHQ2-9): Low Risk   . PHQ-2 Score: 0  Financial Resource Strain: Low Risk   . Difficulty of Paying Living Expenses: Not hard at all  Food Insecurity: No Food Insecurity  . Worried About Charity fundraiser in the Last Year: Never true  . Ran Out of Food in the Last Year: Never true  Housing: Low Risk   . Last Housing Risk Score: 0  Physical Activity: Sufficiently Active  . Days of Exercise per Week: 5 days  . Minutes of Exercise per Session: 120 min  Social Connections: Moderately Isolated  . Frequency of Communication with Friends and Family: More than three times a week  . Frequency of Social Gatherings with Friends and Family: More than three times a week  . Attends Religious Services: More than 4 times per year  . Active Member of Clubs or Organizations: No  . Attends Archivist Meetings: Never  . Marital Status: Widowed  Stress: No Stress Concern Present  . Feeling of Stress : Not at all  Tobacco Use: Medium Risk  . Smoking Tobacco Use: Former Smoker  . Smokeless Tobacco Use: Never Used  Transportation Needs: No Transportation Needs  . Lack of Transportation (Medical): No  . Lack of Transportation (Non-Medical): No    CCM Care Plan  Allergies  Allergen Reactions  . Bee Venom Anaphylaxis    Medications Reviewed Today    Reviewed by Dimitri Ped, RN (Registered Nurse) on 01/25/21 at [phone]  Med List Status: <None>  Medication Order Taking? Sig Documenting Provider Last Dose Status Informant  aspirin 81 MG chewable tablet [id number] No Chew 81 mg by mouth in the morning. [provider] 12/30/2020 Active Self  atorvastatin (LIPITOR) 40 MG tablet [id number] No Take 1 tablet (40 mg total) by mouth daily at 6 PM. Dixie Dials, MD 01/07/2021 Unknown time Active Self  Cholecalciferol (VITAMIN D-3) 125 MCG (5000 UT) TABS [id number] No Take 5,000 Units by mouth in the morning. [provider] 01/04/2021 Active Self  Cyanocobalamin (VITAMIN  B-12) 5000 MCG SUBL [id number] No Place 5,000 mcg under the tongue every 3 (three) days. [provider] Taking Active Self  ELIQUIS 5 MG TABS tablet [id number] No Take 5 mg by mouth 2 (two) times daily. [provider] 01/03/2021 Active Self  EPINEPHrine (EPI-PEN) 0.3 mg/0.3 mL DEVI [id number] No Inject 0.3 mg into the muscle daily as needed (allergic reaction). [provider] Unknown Unknown time Active Self  gabapentin (NEURONTIN) 100 MG capsule [id number] No TAKE 1 CAPSULE BY MOUTH AT  BEDTIME  Patient taking differently: Take 100 mg by mouth at bedtime.   Lyndal Pulley, DO 01/05/2021 Active   glipiZIDE (GLUCOTROL) 5 MG tablet [id number] No TAKE 1 TABLET BY MOUTH 2  TIMES DAILY BEFORE A MEAL  Patient taking differently: Take 5 mg by mouth 2 (two) times daily before a meal.   Philemon Kingdom, MD 01/06/2021 Active   Insulin Syringes, Disposable, U-100 0.5 ML MISC [id number] No Use 1x a day Philemon Kingdom, MD Taking Active Self  lisinopril (ZESTRIL) 20 MG tablet [id number] No Take 20 mg by mouth in the morning. [provider] 01/07/2021 Unknown time Active Self  metFORMIN (GLUCOPHAGE) 1000 MG tablet [id number] No TAKE 1 TABLET BY MOUTH  TWICE DAILY WITH MEALS  Patient taking differently: Take 1,000 mg by mouth in the morning and at bedtime.   Philemon Kingdom, MD 01/07/2021 Unknown time Active   metoprolol succinate (TOPROL-XL) 100 MG 24 hr tablet [id number] No Take 50-100 mg by mouth See admin instructions. Take 1 tablet (100 mg) gy mouth in the morning & take 0.5 tablet (50 mg) by mouth in the evening. [provider] 01/08/2021 6734 Active Self           Med Note Kenton Kingfisher, Germaine Pomfret Dec 27, 2020  8:38 AM)  Multiple Vitamins-Minerals (PRESERVISION AREDS 2 PO) [id number] No Take 1 tablet by mouth in the morning and at bedtime. [provider] 01/03/2021 Unknown time Active Self  nitroGLYCERIN (NITROSTAT) 0.4 MG SL tablet [id number] No Place 1  tablet (0.4 mg total) under the tongue every 5 (five) minutes as needed for chest pain. Dixie Dials, MD Taking Active Self  Jonetta Speak LANCETS FINE [place] [id number] No Use to check sugar daily Philemon Kingdom, MD Taking Active Self  Roma Schanz test strip [id number] No USE 1 STRIP TO CHECK GLUCOSE ONCE DAILY Philemon Kingdom, MD Taking Active Self  spironolactone (ALDACTONE) 25 MG tablet [id number] No Take 25 mg by mouth in the morning. [provider] 01/07/2021 Unknown time Active Self  tamsulosin (FLOMAX) 0.4 MG CAPS capsule [id number] No TAKE 1 CAPSULE BY MOUTH  DAILY  Patient taking differently: Take 0.4 mg by mouth in the morning.   [name], NP 01/07/2021 Unknown time Active   tamsulosin (FLOMAX) 0.4 MG CAPS capsule [id number]  Take 0.4 mg by mouth. [provider]  Active Self          Patient Active Problem List   Diagnosis Date Noted  . Neck mass 01/08/2021  . Atrial fibrillation ([place]) 10/25/2020  . 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 Epistaxis 09/29/2018  . Hyperkalemia 09/29/2018  . AKI (acute kidney injury) ([place]) 09/29/2018  . Lumbar radiculopathy 08/06/2017  . Greater trochanteric bursitis of left hip 07/04/2017  . Rotator cuff arthropathy of left shoulder 01/24/2017  . AC (acromioclavicular) arthritis 01/24/2017  . Vitamin B12 deficiency 01/18/2016  . NSTEMI (non-ST elevated myocardial infarction) ([place]) 10/19/2015  . Acute non Q wave MI (myocardial infarction), initial episode of care ([place]) 10/19/2015  . Peripheral neuropathy 10/13/2015  . 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 Actinic keratosis of right cheek 10/04/2014  . Contact dermatitis and eczema 02/10/2014  . 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 Acute blood loss anemia 08/27/2013  . Insomnia 08/27/2013  . Hip fracture, right ([place]) 07/25/2013  . 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 Renal cell carcinoma ([place]) 07/25/2013  .  History of colonic polyps 07/20/2012  . Hyperlipidemia 11/13/2007  . Obesity 11/13/2007  . Essential hypertension 04/23/2007    Immunization History  Administered Date(s) Administered  . Fluad Quad(high Dose 65+) 07/05/2019  . Influenza Split 07/18/2011, 07/06/2012, 07/07/2012  . Influenza Whole 06/09/2006, 06/17/2008, 07/10/2009, 05/18/2010  . Influenza, High Dose Seasonal PF 06/11/2016, 06/06/2017, 05/26/2018  . Influenza,inj,Quad PF,6+ Mos 06/14/2013, 06/02/2014, 07/14/2015  . Influenza-Unspecified 06/27/2020  . PFIZER(Purple Top)SARS-COV-2 Vaccination 09/28/2019, 10/19/2019, 06/18/2020  . Pneumococcal Conjugate-13 07/14/2014  . Pneumococcal Polysaccharide-23 06/09/2006  . Td 09/09/2001  . Tdap 02/20/2012   Back to the [facility] on Thursday after surgery on Monday  Do we need aspirin and Eliquis?  Eliquis - stay away from NSAIDs, was on the baby aspirin for a long time - only stopped for the surgery  Has cut down on the bad stuff - appetite has been the same        Lab Results  Component Value Date   CHOL 121 10/06/2020   HDL 46.20 10/06/2020   LDLCALC 56 10/06/2020   LDLDIRECT 139.8 07/08/2014   TRIG 92.0 10/06/2020   CHOLHDL 3 10/06/2020    BP Readings from Last 3 Encounters:  01/09/21 129/80  12/26/20 120/90  12/19/20 128/88    Conditions to be addressed/monitored:  Hypertension, Hyperlipidemia, Diabetes and Atrial Fibrillation  Care Plan : Lupus  Updates made by Viona Gilmore, [place] since 02/04/2021 12:00 AM    Problem: Problem: Hypertension, Hyperlipidemia, Diabetes and Atrial Fibrillation     Long-Range Goal: Patient-Specific Goal   Start Date: 01/12/2021  Expected End Date: 01/12/2022  This Visit's Progress: On track  Priority: High  Note:   Current Barriers:  . Unable to independently monitor therapeutic efficacy . Unable to achieve control of diabetes   Pharmacist Clinical Goal(s):  Marland Kitchen Patient will achieve control of diabetes as  evidenced by A1c  through collaboration with PharmD and provider.   Interventions: . 1:1 collaboration with [name], NP regarding development and update of comprehensive plan of care as evidenced by provider attestation and co-signature . Inter-disciplinary care team collaboration (see longitudinal plan of care) . Comprehensive medication review performed; medication list updated in electronic medical record  Hypertension (BP goal <140/90) -Controlled -Current treatment:  Lisinopril 20 mg, 1 tablet once daily  Metoprolol succinate (Toprol-XL) 169m, 1 tablet in the morning and 1/2 tablet  Spironolactone 25 mg 1 tablet in the morning -Medications previously tried: n/a  -Current home readings: does not check at home -Current dietary habits: cut down on "bad foods" -Current exercise habits: going to the [employer] (restarted Thursday after surgery on Monday) -Denies hypotensive/hypertensive symptoms -Educated on Daily salt intake goal < 2300 mg; Exercise goal of 150 minutes per week; Importance of home blood pressure monitoring; Proper BP monitoring technique; -Counseled to monitor BP at home weekly, document, and provide log at future appointments -Counseled on diet and exercise extensively Recommended to continue current medication Recommended purchasing a BP cuff  Hyperlipidemia: (LDL goal < 70) -Controlled -Current treatment: . Atorvastatin 40 mg 1 tablet daily -Medications previously tried: none  -Current dietary patterns: cut down on "bad foods" -[facility]Educated on Cholesterol goals;  Benefits of statin for ASCVD risk reduction; Importance of limiting foods high in cholesterol; -Counseled on diet and exercise extensively Recommended to continue current medication  Diabetes (A1c goal <7%) -Uncontrolled -Current medications: .Marland KitchenGlipizide 5 mg 1 tablet twice daily . Metformin 1000 mg 1 tablet twice daily -Medications previously  tried: Ozempic (cost), Rybelsus (cost)  -Current home glucose readings . fasting glucose: 120-130s . post prandial glucose: 215, 55 (one time occurrence), 320 -Denies hypoglycemic/hyperglycemic symptoms -Current meal patterns:  . breakfast: did not discuss  . lunch: did not discuss  . dinner: did not discuss . snacks: grape tomatoes, PB and crackers . drinks: did not discuss -Current exercise: going to the [employer] -Educated on A1c and blood sugar goals; Exercise goal of 150 minutes per week; Benefits of routine self-monitoring of blood sugar; Carbohydrate counting and/or plate method -Counseled to check feet daily and get yearly eye exams -Counseled on diet and exercise extensively Recommended to continue current medication Counseled on rotating times of day when checking blood sugars to give a better picture of the whole day instead of just fasting  Atrial Fibrillation (Goal: prevent stroke and major bleeding) -Controlled -CHADSVASC: 5 -Current treatment: . Rate control: metoprolol succinate 100 mg 1 tablet in the morning and 1/2 tablet in the evening  . Anticoagulation: Eliquis 5 mg 1 tablet twice daily  -Medications previously tried: none -Home BP and HR readings: does not monitor at home  -Counseled on importance of adherence to anticoagulant exactly as prescribed; bleeding risk associated with Eliquis and importance of self-monitoring for signs/symptoms of bleeding; avoidance of NSAIDs due to increased bleeding risk with anticoagulants; -Recommended to continue current medication  BPH (Goal: minimize symptoms of enlarged prostate) -Controlled -Current treatment  . Tamsulosin 0.4 mg 1 capsule daily -Medications previously tried: none  -Recommended to continue current medication  History of NSTEMI (Goal: prevent future events) -Controlled -Current treatment  . Aspirin 81 mg 1 tablet daily . Nitroglycerin 0.4 mg SL tablet -Medications previously tried: none  -Recommended  to continue current medication   Health Maintenance -Vaccine gaps: shingles -Current therapy:  . Preservision Areds 2 1 capsule in the morning and at bedtime . Vitamin B12 5000 mcg 1 SL tablet three days a week . Vitamin D 5000 units daily -Educated on Cost vs benefit of each product must be carefully weighed by individual consumer -Patient is satisfied with current therapy and denies issues -Counseled on recommendations from Dr. Cruzita Lederer about taking vitamin B12 2500 mcg daily instead of 5000 mcg every other day.   Patient Goals/Self-Care Activities . Patient will:  - take medications as prescribed check glucose daily, document, and provide at future appointments check blood pressure weekly, document, and provide at future appointments target a minimum of 150 minutes of moderate intensity exercise weekly  Follow Up Plan: Telephone follow up appointment with care management team member scheduled for: 4 months         Medication Assistance: Patient does not qualify for any patient assistance programs due to income  Patient's preferred pharmacy is:  [facility], [address], [address] [place], [place] [address] Phone: [phone] Fax: [facility] [place]), [place] - [place] [address] [place] ([place]) [place] [address] Phone: [phone] Fax: [phone]  Uses pill box? Yes Pt endorses 100% compliance  We discussed: Current pharmacy is preferred with insurance plan and patient is satisfied with pharmacy services Patient decided to: Continue current medication management strategy  Care Plan and Follow Up Patient Decision:  Patient agrees to Care Plan and Follow-up.  Plan: Telephone follow up appointment with care management team member scheduled for:  4 months  Jeni Salles, PharmD [place] Pharmacist [place] at  [place] [phone]

## 2021-01-25 ENCOUNTER — Ambulatory Visit: Payer: Medicare Other

## 2021-01-25 DIAGNOSIS — E1159 Type 2 diabetes mellitus with other circulatory complications: Secondary | ICD-10-CM | POA: Diagnosis not present

## 2021-01-25 DIAGNOSIS — I10 Essential (primary) hypertension: Secondary | ICD-10-CM

## 2021-01-25 DIAGNOSIS — E1165 Type 2 diabetes mellitus with hyperglycemia: Secondary | ICD-10-CM

## 2021-01-25 DIAGNOSIS — I4891 Unspecified atrial fibrillation: Secondary | ICD-10-CM | POA: Diagnosis not present

## 2021-01-25 NOTE — Patient Instructions (Addendum)
Visit Information  PATIENT GOALS: Goals Addressed            This Visit's Progress   . RNCM: Better self manage and track my diabetes -Diabetes Type 2   On track    Timeframe:  Long-Range Goal Priority:  Medium Start Date:    10/18/20                       Expected End Date:        07/09/21            Follow Up Date by 03/15/21 at 9:30 AM   - check blood sugar at prescribed times try checking 1 1/2-2 hours after eating -try checking blood sugar after eating at different times -recheck blood sugar if you get an unexpected number - check blood sugar before and after exercise - check blood sugar if I feel it is too high or too low - enter blood sugar readings and medication or insulin into daily log - take the blood sugar log to all doctor visits - take the blood sugar meter to all doctor visits   -keep glucose tablets or some other fast acting sugar on hand to treat low blood sugars Why is this important?    Checking your blood sugar at home helps to keep it from getting very high or very low.   Writing the results in a diary or log helps the doctor know how to care for you.   Your blood sugar log should have the time, date and the results.   Also, write down the amount of insulin or other medicine that you take.   Other information, like what you ate, exercise done and how you were feeling, will also be helpful.     Notes:     . RNCM: Develop and follow self management plan for Atrial Fibrillation   On track    Timeframe:  Long-Range Goal Priority:  High Start Date:      10/18/20                       Expected End Date:      07/09/21                 Follow Up Date 03/15/21 at 9:30 AM    - check pulse (heart) rate once a day - cut down alcohol use - make a plan to exercise regularly - make a plan to eat healthy - take medicine as prescribed  -review atrial fibrillation action plan -take anticoagulant to prevent stroke -take log of B/P and weights to cardiology visit Why  is this important?    Atrial fibrillation may have no symptoms. Sometimes the symptoms get worse or happen more often.   It is important to keep track of what your symptoms are and when they happen.   A change in symptoms is important to discuss with your doctor or nurse.   Being active and healthy eating will also help you manage your heart condition.     Notes:        Preventing Hypoglycemia Hypoglycemia occurs when the level of sugar (glucose) in the blood is too low. Hypoglycemia can happen in people who do or do not have diabetes (diabetes mellitus). It can develop quickly, and it can be a medical emergency. For most people with diabetes, a blood glucose level below 70 mg/dL (3.9 mmol/L) is considered hypoglycemia. Glucose is a type of  sugar that provides the body's main source of energy. Certain hormones (insulin and glucagon) control the level of glucose in the blood. Insulin lowers blood glucose, and glucagon increases blood glucose. Hypoglycemia can result from having too much insulin in the bloodstream, or from not eating enough food that contains glucose. Your risk for hypoglycemia is higher:  If you take insulin or diabetes medicines to help lower your blood glucose or help your body make more insulin.  If you skip or delay a meal or snack.  If you are ill.  During and after exercise. You can prevent hypoglycemia by working with your health care provider to adjust your meal plan as needed and by taking other precautions. How can hypoglycemia affect me? Mild symptoms Mild hypoglycemia may not cause any symptoms. If you do have symptoms, they may include:  Hunger.  Anxiety.  Sweating and feeling clammy.  Dizziness or feeling light-headed.  Sleepiness.  Nausea.  Increased heart rate.  Headache.  Blurry vision.  Irritability.  Tingling or numbness around the mouth, lips, or tongue.  A change in coordination.  Restless sleep. If mild hypoglycemia is not  recognized and treated, it can quickly become moderate or severe hypoglycemia. Moderate symptoms Moderate hypoglycemia can cause:  Mental confusion and poor judgment.  Behavior changes.  Weakness.  Irregular heartbeat. Severe symptoms Severe hypoglycemia is a medical emergency. It can cause:  Fainting.  Seizures.  Loss of consciousness (coma).  Death. What nutrition changes can be made?  Work with your health care provider or diet and nutrition specialist (dietitian) to make a healthy meal plan that is right for you. Follow your meal plan carefully.  Eat meals at regular times.  If recommended by your health care provider, have snacks between meals.  Donot skip or delay meals or snacks. You can be at risk for hypoglycemia if you are not getting enough carbohydrates. What lifestyle changes can be made?  Work closely with your health care provider to manage your blood glucose. Make sure you know: ? Your goal blood glucose levels. ? How and when to check your blood glucose. ? The symptoms of hypoglycemia. It is important to treat it right away to keep it from becoming severe.  Do not drink alcohol on an empty stomach.  When you are ill, check your blood glucose more often than usual. Follow your sick day plan whenever you cannot eat or drink normally. Make this plan in advance with your health care provider.  Always check your blood glucose before, during, and after exercise.   How is this treated? This condition can often be treated by immediately eating or drinking something that contains sugar, such as:  Fruit juice, 4-6 oz (120-150 mL).  Regular (not diet) soda, 4-6 oz (120-150 mL).  Low-fat milk, 4 oz (120 mL).  Several pieces of hard candy.  Sugar or honey, 1 Tbsp (15 mL). Treating hypoglycemia if you have diabetes If you are alert and able to swallow safely, follow the 15:15 rule:  Take 15 grams of a rapid-acting carbohydrate. Talk with your health care  provider about how much you should take.  Rapid-acting options include: ? Glucose pills (take 15 grams). ? 6-8 pieces of hard candy. ? 4-6 oz (120-150 mL) of fruit juice. ? 4-6 oz (120-150 mL) of regular (not diet) soda.  Check your blood glucose 15 minutes after you take the carbohydrate.  If the repeat blood glucose level is still at or below 70 mg/dL (3.9 mmol/L), take 15  grams of a carbohydrate again.  If your blood glucose level does not increase above 70 mg/dL (3.9 mmol/L) after 3 tries, seek emergency medical care.  After your blood glucose level returns to normal, eat a meal or a snack within 1 hour. Treating severe hypoglycemia Severe hypoglycemia is when your blood glucose level is at or below 54 mg/dL (3 mmol/L). Severe hypoglycemia is a medical emergency. Get medical help right away. If you have severe hypoglycemia and you cannot eat or drink, you may need an injection of glucagon. A family member or close friend should learn how to check your blood glucose and how to give you a glucagon injection. Ask your health care provider if you need to have an emergency glucagon injection kit available. Severe hypoglycemia may need to be treated in a hospital. The treatment may include getting glucose through an IV. You may also need treatment for the cause of your hypoglycemia. Where to find more information  American Diabetes Association: www.diabetes.[employer] of Diabetes and Digestive and Kidney Diseases: [employer] Contact a health care provider if:  You have problems keeping your blood glucose in your target range.  You have frequent episodes of hypoglycemia. Get help right away if:  You continue to have hypoglycemia symptoms after eating or drinking something containing glucose.  Your blood glucose level is at or below 54 mg/dL (3 mmol/L).  You faint.  You have a seizure. These symptoms may represent a serious problem that is an emergency. Do not  wait to see if the symptoms will go away. Get medical help right away. Call your local emergency services (911 in the [place] Summary  Know the symptoms of hypoglycemia, and when you are at risk for it (such as during exercise or when you are sick). Check your blood glucose often when you are at risk for hypoglycemia.  Hypoglycemia can develop quickly, and it can be dangerous if it is not treated right away. If you have a history of severe hypoglycemia, make sure you know how to use your glucagon injection kit.  Make sure you know how to treat hypoglycemia. Keep a carbohydrate snack available when you may be at risk for hypoglycemia. This information is not intended to replace advice given to you by your health care provider. Make sure you discuss any questions you have with your health care provider. Document Revised: 12/18/2018 Document Reviewed: 04/23/2017 Elsevier Patient Education  2021 [place].   Patient verbalizes understanding of instructions provided today and agrees to view in [place].   Telephone follow up appointment with care management team member scheduled for: 03/15/21  Peter Garter RN, [facility], CDE Care Management Coordinator [place] [phone], Mobile [phone]

## 2021-01-25 NOTE — Chronic Care Management (AMB) (Signed)
Chronic Care Management   CCM RN Visit Note  01/25/2021 Name: Charles [name] MRN: [id number] DOB: 02-Sep-1941  Subjective: Charles [name] is a 80 y.o. year old male who is a primary care patient of [name], NP. The care management team was consulted for assistance with disease management and care coordination needs.    Engaged with patient by telephone for follow up visit in response to provider referral for case management and/or care coordination services.   Consent to Services:  The patient was given information about Chronic Care Management services, agreed to services, and gave verbal consent prior to initiation of services.  Please see initial visit note for detailed documentation.   Patient agreed to services and verbal consent obtained.   Assessment: Review of patient past medical history, allergies, medications, health status, including review of consultants reports, laboratory and other test data, was performed as part of comprehensive evaluation and provision of chronic care management services.   SDOH (Social Determinants of Health) assessments and interventions performed:    CCM Care Plan  Allergies  Allergen Reactions  . Bee Venom Anaphylaxis    Outpatient Encounter Medications as of 01/25/2021  Medication Sig  . aspirin 81 MG chewable tablet Chew 81 mg by mouth in the morning.  Marland Kitchen atorvastatin (LIPITOR) 40 MG tablet Take 1 tablet (40 mg total) by mouth daily at 6 PM.  . Cholecalciferol (VITAMIN D-3) 125 MCG (5000 UT) TABS Take 5,000 Units by mouth in the morning.  . Cyanocobalamin (VITAMIN B-12) 5000 MCG SUBL Place 5,000 mcg under the tongue every 3 (three) days.  Marland Kitchen ELIQUIS 5 MG TABS tablet Take 5 mg by mouth 2 (two) times daily.  Marland Kitchen EPINEPHrine (EPI-PEN) 0.3 mg/0.3 mL DEVI Inject 0.3 mg into the muscle daily as needed (allergic reaction).  . gabapentin (NEURONTIN) 100 MG capsule TAKE 1 CAPSULE BY MOUTH AT  BEDTIME (Patient taking differently: Take 100 mg by mouth  at bedtime.)  . glipiZIDE (GLUCOTROL) 5 MG tablet TAKE 1 TABLET BY MOUTH 2  TIMES DAILY BEFORE A MEAL (Patient taking differently: Take 5 mg by mouth 2 (two) times daily before a meal.)  . Insulin Syringes, Disposable, U-100 0.5 ML MISC Use 1x a day  . lisinopril (ZESTRIL) 20 MG tablet Take 20 mg by mouth in the morning.  . metFORMIN (GLUCOPHAGE) 1000 MG tablet TAKE 1 TABLET BY MOUTH  TWICE DAILY WITH MEALS (Patient taking differently: Take 1,000 mg by mouth in the morning and at bedtime.)  . metoprolol succinate (TOPROL-XL) 100 MG 24 hr tablet Take 50-100 mg by mouth See admin instructions. Take 1 tablet (100 mg) gy mouth in the morning & take 0.5 tablet (50 mg) by mouth in the evening.  . Multiple Vitamins-Minerals (PRESERVISION AREDS 2 PO) Take 1 tablet by mouth in the morning and at bedtime.  . nitroGLYCERIN (NITROSTAT) 0.4 MG SL tablet Place 1 tablet (0.4 mg total) under the tongue every 5 (five) minutes as needed for chest pain.  Glory Rosebush DELICA LANCETS FINE MISC Use to check sugar daily  . ONETOUCH VERIO test strip USE 1 STRIP TO CHECK GLUCOSE ONCE DAILY  . spironolactone (ALDACTONE) 25 MG tablet Take 25 mg by mouth in the morning.  . tamsulosin (FLOMAX) 0.4 MG CAPS capsule TAKE 1 CAPSULE BY MOUTH  DAILY (Patient taking differently: Take 0.4 mg by mouth in the morning.)  . tamsulosin (FLOMAX) 0.4 MG CAPS capsule Take 0.4 mg by mouth.   No facility-administered encounter medications on file as  of 01/25/2021.    Patient Active Problem List   Diagnosis Date Noted  . Neck mass 01/08/2021  . Atrial fibrillation ([place]) 10/25/2020  . 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 Epistaxis 09/29/2018  . Hyperkalemia 09/29/2018  . AKI (acute kidney injury) ([place]) 09/29/2018  . Lumbar radiculopathy 08/06/2017  . Greater trochanteric bursitis of left hip 07/04/2017  . Rotator cuff arthropathy of left shoulder 01/24/2017  . AC (acromioclavicular) arthritis 01/24/2017  . Vitamin B12 deficiency 01/18/2016  .  NSTEMI (non-ST elevated myocardial infarction) ([place]) 10/19/2015  . Acute non Q wave MI (myocardial infarction), initial episode of care ([place]) 10/19/2015  . Peripheral neuropathy 10/13/2015  . 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[place]) 07/14/2015  . Actinic keratosis of right cheek 10/04/2014  . Contact dermatitis and eczema 02/10/2014  . 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 Acute blood loss anemia 08/27/2013  . Insomnia 08/27/2013  . Hip fracture, right ([place]) 07/25/2013  . 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 Renal cell carcinoma ([place]) 07/25/2013  . History of colonic polyps 07/20/2012  . Hyperlipidemia 11/13/2007  . Obesity 11/13/2007  . Essential hypertension 04/23/2007    Conditions to be addressed/monitored:Atrial Fibrillation, HTN and DMII  Care Plan : RNCM:Atrial Fibrillation (Adult)  Updates made by Dimitri Ped, RN since 01/25/2021 12:00 AM    Problem: Lack of self mangement plan for Atrial Fibrillation   Priority: High    Long-Range Goal: RNCM: Develolp and self manage atrial fibrilation   Start Date: 10/18/2020  Expected End Date: 07/09/2021  This Visit's Progress: On track  Recent Progress: On track  Priority: High  Note:   Current Barriers:  Marland Kitchen Knowledge Deficits related to new dx of atrial fibrillation with hx of HTN and HLD -Reports no atrial fibrillation symptoms. Denies any shortness of breath or chest pain.  Reports taking Eliquis as directed.  Reports next appt with Dr. Terrence Dupont on 02/08/21. States he would like to discuss with Dr.Harwani about stopping or cutting back on the spirolactone that he started after he had fluid build up while he was in atrial fib.  Denies any swelling and his weight has been stable.  States his B/P usually 120/70 when he checks it at the [facility]. 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  States he is healed from the surgery    . Chronic Disease Management support and  education needs related to Atrial fibrillation Nurse Case Manager Clinical Goal(s):  Marland Kitchen Over the next 120 days, patient will verbalize understanding of plan for self management of atrial fibrillation . Over the next 120 days, patient will verbalize basic understanding of atrial fibrillation disease process and self health management plan as evidenced by decrease in symptoms, adherence to medications, keeping provider appts  Interventions:  . 1:1 collaboration with [name], NP regarding development and update of comprehensive plan of care as evidenced by provider attestation and co-signature . Inter-disciplinary care team collaboration (see longitudinal plan of care)  . Reinforced patient to call provider for worsening symptoms of atrial fibrillation  . Reinforced education to patient re: atrial fibrillation disease process and atrial fibrillation action plan  . Reviewed medications with patient and discussed importance of taking medications as ordered to lower heart rate and taking anticoagulant . Reviewed scheduled/upcoming provider appointments including: Dr. Terrence Dupont cardiology 02/08/21, Dr. Cruzita Lederer 05/01/21, no upcoming primary care appt . Discussed plans with patient for ongoing care management follow up and provided patient with direct contact information for care  management team . Encouraged to discuss his medication questions with Dr.Harwani at his visit on 02/08/21 . Advised to keep a log of his weights and B/P to take to his appt with Dr.Harwani Patient Goals/Self-Care Activities Patient will attend all scheduled provider appointments Patient will call provider office for new concerns or questions - check pulse (heart) rate once a day - cut down alcohol use - make a plan to exercise regularly - make a plan to eat healthy - take medicine as prescribed -review atrial fibrillation action plan -take anticoagulant to prevent stroke -take log of B/P and weights to cardiology visit Follow  Up Plan: Telephone follow up appointment with care management team member scheduled for: 03/15/21 at 9:30 AM The patient has been provided with contact information for the care management team and has been advised to call with any health related questions or concerns.         Care Plan : RNCM:Diabetes Type 2 (Adult)  Updates made by Dimitri Ped, RN since 01/25/2021 12:00 AM    Problem: RNCM: Lack of effective self management of Diabetes Type 2   Priority: Medium    Long-Range Goal: Effective self management of blood sugars and Diabetes Type 2   Start Date: 10/18/2020  Expected End Date: 07/09/2021  This Visit's Progress: On track  Recent Progress: On track  Priority: Medium  Note:   Objective:  Lab Results  Component Value Date   HGBA1C 7.9 (A) 08/21/2020 .   Lab Results  Component Value Date   CREATININE 1.06 10/06/2020   CREATININE 1.02 10/08/2019   CREATININE 1.21 10/26/2018   Current Barriers:  Marland Kitchen Knowledge Deficits related to basic Diabetes pathophysiology and self care/management with hx of HTN and atrial fib. Hemoglobin A1C was down to 7.3% on 12/19/20.  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  States his fasting readings range from 120-160.  States he has not been checking later in the day but he did have one low reading in the later afternoon of 55.  States he did not feel low.  States he are some peanut butter and crackers. States he can not recall if he had eaten less at lunch or if he had been more active.  States he did not recheck his CBG after eating . Does not adhere to provider recommendations re: Low CHO diet and portion control. Reports watching portions better and avoiding second helpings . Reports going to gym 5 days a week walking 1-2 miles and working on exercise/weight machines . 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  Reports he is healed from his surgery Case Manager Clinical  Goal(s):  . patient will demonstrate improved adherence to prescribed treatment plan for diabetes self care/management as evidenced by: daily monitoring and recording of CBG  adherence to ADA/ carb modified diet exercise 3 days/week adherence to prescribed medication regimen Interventions:  . Collaboration with [name], NP regarding development and update of comprehensive plan of care as evidenced by provider attestation and co-signature . Inter-disciplinary care team collaboration (see longitudinal plan of care) . Reviewed education to patient about basic DM disease process . Reviewed medications with patient and discussed importance of medication adherence . Discussed plans with patient for ongoing care management follow up and provided patient with direct contact information for care management team . Reviewed scheduled/upcoming provider appointments including: Dr. Terrence Dupont cardiology Dr. Cruzita Lederer endocrinologist 05/01/21, Dr. Tamala Julian sports  med 03/22/21 . Reinforced  to check cbg daily at varying times and record, calling provider for findings outside established parameters.   . Reinforced importance of getting regular exercise with resistance training at least 2 times a week . Instructed on different lower CHO nutritional supplements he can drink after his neck surgery if he is not able to eat as much as he usually does or if he has any swallowing problems . Reinforced s/sx of hypoglycemia and actions to take to treat especially if he is unable to eat as much after his surgery and is taking the glipizide . Reviewed the rule of 15 for hypoglycemia and discussed sources of fast acting sugar to have on hand Patient Goals/Self-Care Activities . Over the next 90 days, patient will:  - Self administers oral medications as prescribed Attends all scheduled provider appointments Checks blood sugars as prescribed and utilize hyper and hypoglycemia protocol as needed Adheres to prescribed ADA/carb  modified - check blood sugar at prescribed times try checking 1 1/2-2 hours after eating -try checking blood sugar after eating at different times -recheck blood sugar if you get an unexpected number - check blood sugar before and after exercise - check blood sugar if I feel it is too high or too low - enter blood sugar readings and medication or insulin into daily log - take the blood sugar log to all doctor visits - take the blood sugar meter to all doctor visits  -keep glucose tablets or some other fast acting sugar on hand to treat low blood sugars Follow Up Plan: Telephone follow up appointment with care management team member scheduled for: 01/25/21 at 9:30 AM The patient has been provided with contact information for the care management team and has been advised to call with any health related questions or concerns.       Plan:Telephone follow up appointment with care management team member scheduled for:  03/15/21 and The patient has been provided with contact information for the care management team and has been advised to call with any health related questions or concerns.  Peter Garter RN, [facility], CDE Care Management Coordinator [facility] [phone], Mobile [phone]

## 2021-02-04 NOTE — Patient Instructions (Signed)
Hi Charles [name],  It was great speaking with you again! Below is a summary of some of the topics we discussed.   Please reach out to me if you have any questions or need anything before our follow up!  Best, [name]  Jeni Salles, PharmD, [facility] [place]  Visit Information  Goals Addressed   None    Patient Care Plan: RNCM:Atrial Fibrillation (Adult)    Problem Identified: Lack of self mangement plan for Atrial Fibrillation   Priority: High    Long-Range Goal: RNCM: Develolp and self manage atrial fibrilation   Start Date: 10/18/2020  Expected End Date: 07/09/2021  This Visit's Progress: On track  Recent Progress: On track  Priority: High  Note:   Current Barriers:  Marland Kitchen Knowledge Deficits related to new dx of atrial fibrillation with hx of HTN and HLD -Reports no atrial fibrillation symptoms. Denies any shortness of breath or chest pain.  Reports taking Eliquis as directed.  Reports next appt with Dr. Terrence Dupont on 02/08/21. States he would like to discuss with Dr.Harwani about stopping or cutting back on the spirolactone that he started after he had fluid build up while he was in atrial fib.  Denies any swelling and his weight has been stable.  States his B/P usually 120/70 when he checks it at the [facility]. 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  States he is healed from the surgery    . Chronic Disease Management support and education needs related to Atrial fibrillation Nurse Case Manager Clinical Goal(s):  Marland Kitchen Over the next 120 days, patient will verbalize understanding of plan for self management of atrial fibrillation . Over the next 120 days, patient will verbalize basic understanding of atrial fibrillation disease process and self health management plan as evidenced by decrease in symptoms, adherence to medications, keeping provider appts  Interventions:  . 1:1 collaboration with [name], NP regarding  development and update of comprehensive plan of care as evidenced by provider attestation and co-signature . Inter-disciplinary care team collaboration (see longitudinal plan of care)  . Reinforced patient to call provider for worsening symptoms of atrial fibrillation  . Reinforced education to patient re: atrial fibrillation disease process and atrial fibrillation action plan  . Reviewed medications with patient and discussed importance of taking medications as ordered to lower heart rate and taking anticoagulant . Reviewed scheduled/upcoming provider appointments including: Dr. Terrence Dupont cardiology 02/08/21, Dr. Cruzita Lederer 05/01/21, no upcoming primary care appt . Discussed plans with patient for ongoing care management follow up and provided patient with direct contact information for care management team . Encouraged to discuss his medication questions with Dr.Harwani at his visit on 02/08/21 . Advised to keep a log of his weights and B/P to take to his appt with Dr.Harwani Patient Goals/Self-Care Activities Patient will attend all scheduled provider appointments Patient will call provider office for new concerns or questions - check pulse (heart) rate once a day - cut down alcohol use - make a plan to exercise regularly - make a plan to eat healthy - take medicine as prescribed -review atrial fibrillation action plan -take anticoagulant to prevent stroke -take log of B/P and weights to cardiology visit Follow Up Plan: Telephone follow up appointment with care management team member scheduled for: 03/15/21 at 9:30 AM The patient has been provided with contact information for the care management team and has been advised to call with any health related questions or concerns.  Patient Care Plan: RNCM:Diabetes Type 2 (Adult)    Problem Identified: RNCM: Lack of effective self management of Diabetes Type 2   Priority: Medium    Long-Range Goal: Effective self management of blood sugars and  Diabetes Type 2   Start Date: 10/18/2020  Expected End Date: 07/09/2021  This Visit's Progress: On track  Recent Progress: On track  Priority: Medium  Note:   Objective:  Lab Results  Component Value Date   HGBA1C 7.9 (A) 08/21/2020 .   Lab Results  Component Value Date   CREATININE 1.06 10/06/2020   CREATININE 1.02 10/08/2019   CREATININE 1.21 10/26/2018   Current Barriers:  Marland Kitchen Knowledge Deficits related to basic Diabetes pathophysiology and self care/management with hx of HTN and atrial fib. Hemoglobin A1C was down to 7.3% on 12/19/20.  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  States his fasting readings range from 120-160.  States he has not been checking later in the day but he did have one low reading in the later afternoon of 55.  States he did not feel low.  States he are some peanut butter and crackers. States he can not recall if he had eaten less at lunch or if he had been more active.  States he did not recheck his CBG after eating . Does not adhere to provider recommendations re: Low CHO diet and portion control. Reports watching portions better and avoiding second helpings . Reports going to gym 5 days a week walking 1-2 miles and working on exercise/weight machines . 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  Reports he is healed from his surgery Case Manager Clinical Goal(s):  . patient will demonstrate improved adherence to prescribed treatment plan for diabetes self care/management as evidenced by: daily monitoring and recording of CBG  adherence to ADA/ carb modified diet exercise 3 days/week adherence to prescribed medication regimen Interventions:  . Collaboration with [name], NP regarding development and update of comprehensive plan of care as evidenced by provider attestation and co-signature . Inter-disciplinary care team collaboration (see longitudinal plan of care) . Reviewed education  to patient about basic DM disease process . Reviewed medications with patient and discussed importance of medication adherence . Discussed plans with patient for ongoing care management follow up and provided patient with direct contact information for care management team . Reviewed scheduled/upcoming provider appointments including: Dr. Terrence Dupont cardiology Dr. Cruzita Lederer endocrinologist 05/01/21, Dr. Tamala Julian sports med 03/22/21 . Reinforced  to check cbg daily at varying times and record, calling provider for findings outside established parameters.   . Reinforced importance of getting regular exercise with resistance training at least 2 times a week . Instructed on different lower CHO nutritional supplements he can drink after his neck surgery if he is not able to eat as much as he usually does or if he has any swallowing problems . Reinforced s/sx of hypoglycemia and actions to take to treat especially if he is unable to eat as much after his surgery and is taking the glipizide . Reviewed the rule of 15 for hypoglycemia and discussed sources of fast acting sugar to have on hand Patient Goals/Self-Care Activities . Over the next 90 days, patient will:  - Self administers oral medications as prescribed Attends all scheduled provider appointments Checks blood sugars as prescribed and utilize hyper and hypoglycemia protocol as needed Adheres to prescribed ADA/carb modified - check blood sugar at  prescribed times try checking 1 1/2-2 hours after eating -try checking blood sugar after eating at different times -recheck blood sugar if you get an unexpected number - check blood sugar before and after exercise - check blood sugar if I feel it is too high or too low - enter blood sugar readings and medication or insulin into daily log - take the blood sugar log to all doctor visits - take the blood sugar meter to all doctor visits  -keep glucose tablets or some other fast acting sugar on hand to treat low  blood sugars Follow Up Plan: Telephone follow up appointment with care management team member scheduled for: 01/25/21 at 9:30 AM The patient has been provided with contact information for the care management team and has been advised to call with any health related questions or concerns.     Patient Care Plan: CCM Pharmacy Care Plan    Problem Identified: Problem: Hypertension, Hyperlipidemia, Diabetes and Atrial Fibrillation     Long-Range Goal: Patient-Specific Goal   Start Date: 01/12/2021  Expected End Date: 01/12/2022  This Visit's Progress: On track  Priority: High  Note:   Current Barriers:  . Unable to independently monitor therapeutic efficacy . Unable to achieve control of diabetes   Pharmacist Clinical Goal(s):  Marland Kitchen Patient will achieve control of diabetes as evidenced by A1c  through collaboration with PharmD and provider.   Interventions: . 1:1 collaboration with [name], NP regarding development and update of comprehensive plan of care as evidenced by provider attestation and co-signature . Inter-disciplinary care team collaboration (see longitudinal plan of care) . Comprehensive medication review performed; medication list updated in electronic medical record  Hypertension (BP goal <140/90) -Controlled -Current treatment:  Lisinopril 20 mg, 1 tablet once daily  Metoprolol succinate (Toprol-XL) 100mg , 1 tablet in the morning and 1/2 tablet  Spironolactone 25 mg 1 tablet in the morning -Medications previously tried: n/a  -Current home readings: does not check at home -Current dietary habits: cut down on "bad foods" -Current exercise habits: going to the [employer] (restarted Thursday after surgery on Monday) -Denies hypotensive/hypertensive symptoms -Educated on Daily salt intake goal < 2300 mg; Exercise goal of 150 minutes per week; Importance of home blood pressure monitoring; Proper BP monitoring technique; -Counseled to monitor BP at home weekly, document, and  provide log at future appointments -Counseled on diet and exercise extensively Recommended to continue current medication Recommended purchasing a BP cuff  Hyperlipidemia: (LDL goal < 70) -Controlled -Current treatment: . Atorvastatin 40 mg 1 tablet daily -Medications previously tried: none  -Current dietary patterns: cut down on "bad foods" -Current exercise habits: exercising at the [facility] -Educated on Cholesterol goals;  Benefits of statin for ASCVD risk reduction; Importance of limiting foods high in cholesterol; -Counseled on diet and exercise extensively Recommended to continue current medication  Diabetes (A1c goal <7%) -Uncontrolled -Current medications: Marland Kitchen Glipizide 5 mg 1 tablet twice daily . Metformin 1000 mg 1 tablet twice daily -Medications previously tried: Ozempic (cost), Rybelsus (cost)  -Current home glucose readings . fasting glucose: 120-130s . post prandial glucose: 215, 55 (one time occurrence), 320 -Denies hypoglycemic/hyperglycemic symptoms -Current meal patterns:  . breakfast: did not discuss  . lunch: did not discuss  . dinner: did not discuss . snacks: grape tomatoes, PB and crackers . drinks: did not discuss -Current exercise: going to the [employer] -Educated on A1c and blood sugar goals; Exercise goal of 150 minutes per week; Benefits of routine self-monitoring of blood sugar; Carbohydrate counting and/or  plate method -Counseled to check feet daily and get yearly eye exams -Counseled on diet and exercise extensively Recommended to continue current medication Counseled on rotating times of day when checking blood sugars to give a better picture of the whole day instead of just fasting  Atrial Fibrillation (Goal: prevent stroke and major bleeding) -Controlled -CHADSVASC: 5 -Current treatment: . Rate control: metoprolol succinate 100 mg 1 tablet in the morning and 1/2 tablet in the evening  . Anticoagulation: Eliquis 5 mg 1 tablet twice daily   -Medications previously tried: none -Home BP and HR readings: does not monitor at home  -Counseled on importance of adherence to anticoagulant exactly as prescribed; bleeding risk associated with Eliquis and importance of self-monitoring for signs/symptoms of bleeding; avoidance of NSAIDs due to increased bleeding risk with anticoagulants; -Recommended to continue current medication  BPH (Goal: minimize symptoms of enlarged prostate) -Controlled -Current treatment  . Tamsulosin 0.4 mg 1 capsule daily -Medications previously tried: none  -Recommended to continue current medication  History of NSTEMI (Goal: prevent future events) -Controlled -Current treatment  . Aspirin 81 mg 1 tablet daily . Nitroglycerin 0.4 mg SL tablet -Medications previously tried: none  -Recommended to continue current medication   Health Maintenance -Vaccine gaps: shingles -Current therapy:  . Preservision Areds 2 1 capsule in the morning and at bedtime . Vitamin B12 5000 mcg 1 SL tablet three days a week . Vitamin D 5000 units daily -Educated on Cost vs benefit of each product must be carefully weighed by individual consumer -Patient is satisfied with current therapy and denies issues -Counseled on recommendations from Dr. Cruzita Lederer about taking vitamin B12 2500 mcg daily instead of 5000 mcg every other day.   Patient Goals/Self-Care Activities . Patient will:  - take medications as prescribed check glucose daily, document, and provide at future appointments check blood pressure weekly, document, and provide at future appointments target a minimum of 150 minutes of moderate intensity exercise weekly  Follow Up Plan: Telephone follow up appointment with care management team member scheduled for: 4 months       Patient verbalizes understanding of instructions provided today and agrees to view in [place].  Telephone follow up appointment with pharmacy team member scheduled for: 4 months  Viona Gilmore, [facility]

## 2021-02-06 DIAGNOSIS — E113291 Type 2 diabetes mellitus with mild nonproliferative diabetic retinopathy without macular edema, right eye: Secondary | ICD-10-CM | POA: Diagnosis not present

## 2021-02-12 DIAGNOSIS — E785 Hyperlipidemia, unspecified: Secondary | ICD-10-CM | POA: Diagnosis not present

## 2021-02-12 DIAGNOSIS — E119 Type 2 diabetes mellitus without complications: Secondary | ICD-10-CM | POA: Diagnosis not present

## 2021-02-12 DIAGNOSIS — I10 Essential (primary) hypertension: Secondary | ICD-10-CM | POA: Diagnosis not present

## 2021-02-12 DIAGNOSIS — I2510 Atherosclerotic heart disease of native coronary artery without angina pectoris: Secondary | ICD-10-CM | POA: Diagnosis not present

## 2021-02-12 DIAGNOSIS — I480 Paroxysmal atrial fibrillation: Secondary | ICD-10-CM | POA: Diagnosis not present

## 2021-02-12 DIAGNOSIS — E559 Vitamin D deficiency, unspecified: Secondary | ICD-10-CM | POA: Diagnosis not present

## 2021-02-16 ENCOUNTER — Telehealth: Payer: Self-pay | Admitting: Pharmacist

## 2021-02-16 NOTE — Chronic Care Management (AMB) (Signed)
Chronic Care Management Pharmacy Assistant   Name: Charles [name]  MRN: [id number] DOB: 05-Feb-1941  Reason for Encounter: Disease State/ Diabetes Assessment Call.    Conditions to be addressed/monitored: DMII  Recent office visits:  01/25/21 Peter Garter CCM RN [facility]) - seen for type 2 diabetes, A-FIB and hypertension. No medication changes and follow up on 03/15/21.   Recent consult visits:  None.   Hospital visits:  None in previous 6 months  Medications: Outpatient Encounter Medications as of 02/16/2021  Medication Sig   aspirin 81 MG chewable tablet Chew 81 mg by mouth in the morning.   atorvastatin (LIPITOR) 40 MG tablet Take 1 tablet (40 mg total) by mouth daily at 6 PM.   Cholecalciferol (VITAMIN D-3) 125 MCG (5000 UT) TABS Take 5,000 Units by mouth in the morning.   Cyanocobalamin (VITAMIN B-12) 5000 MCG SUBL Place 5,000 mcg under the tongue every 3 (three) days.   ELIQUIS 5 MG TABS tablet Take 5 mg by mouth 2 (two) times daily.   EPINEPHrine (EPI-PEN) 0.3 mg/0.3 mL DEVI Inject 0.3 mg into the muscle daily as needed (allergic reaction).   gabapentin (NEURONTIN) 100 MG capsule TAKE 1 CAPSULE BY MOUTH AT  BEDTIME (Patient taking differently: Take 100 mg by mouth at bedtime.)   glipiZIDE (GLUCOTROL) 5 MG tablet TAKE 1 TABLET BY MOUTH 2  TIMES DAILY BEFORE A MEAL (Patient taking differently: Take 5 mg by mouth 2 (two) times daily before a meal.)   Insulin Syringes, Disposable, U-100 0.5 ML MISC Use 1x a day   lisinopril (ZESTRIL) 20 MG tablet Take 20 mg by mouth in the morning.   metFORMIN (GLUCOPHAGE) 1000 MG tablet TAKE 1 TABLET BY MOUTH  TWICE DAILY WITH MEALS (Patient taking differently: Take 1,000 mg by mouth in the morning and at bedtime.)   metoprolol succinate (TOPROL-XL) 100 MG 24 hr tablet Take 50-100 mg by mouth See admin instructions. Take 1 tablet (100 mg) gy mouth in the morning & take 0.5 tablet (50 mg) by mouth in the evening.   Multiple  Vitamins-Minerals (PRESERVISION AREDS 2 PO) Take 1 tablet by mouth in the morning and at bedtime.   nitroGLYCERIN (NITROSTAT) 0.4 MG SL tablet Place 1 tablet (0.4 mg total) under the tongue every 5 (five) minutes as needed for chest pain.   ONETOUCH DELICA LANCETS FINE MISC Use to check sugar daily   ONETOUCH VERIO test strip USE 1 STRIP TO CHECK GLUCOSE ONCE DAILY   spironolactone (ALDACTONE) 25 MG tablet Take 25 mg by mouth in the morning.   tamsulosin (FLOMAX) 0.4 MG CAPS capsule TAKE 1 CAPSULE BY MOUTH  DAILY (Patient taking differently: Take 0.4 mg by mouth in the morning.)   tamsulosin (FLOMAX) 0.4 MG CAPS capsule Take 0.4 mg by mouth.   No facility-administered encounter medications on file as of 02/16/2021.   Recent Relevant Labs: Lab Results  Component Value Date/Time   HGBA1C 7.3 (A) 12/19/2020 11:20 AM   HGBA1C 7.9 (A) 08/21/2020 08:38 AM   HGBA1C 7.9 (H) 09/29/2018 02:23 AM   HGBA1C 6.9 (H) 08/08/2016 08:56 AM   MICROALBUR 13.8 (H) 08/08/2016 08:56 AM   MICROALBUR 11.4 (H) 07/21/2015 08:37 AM    Kidney Function Lab Results  Component Value Date/Time   CREATININE 1.08 01/08/2021 08:00 AM   CREATININE 1.08 10/25/2020 10:19 AM   CREATININE 1.21 (H) 02/18/2017 08:30 AM   GFR 65.23 10/25/2020 10:19 AM   GFRNONAA >60 01/08/2021 08:00 AM   GFRNONAA  58 (L) 02/18/2017 08:30 AM   GFRAA 53 (L) 09/29/2018 10:14 AM   GFRAA 67 02/18/2017 08:30 AM    Current antihyperglycemic regimen:  Glipizide 5 mg 1 tablet twice daily Metformin 1000 mg 1 tablet twice daily What recent interventions/DTPs have been made to improve glycemic control:  None.  Have there been any recent hospitalizations or ED visits since last visit with CPP? No Patient denies hypoglycemic symptoms, including None Patient denies hyperglycemic symptoms, including none How often are you checking your blood sugar? once daily  What are your blood sugars ranging?  Patient reports that he is currently in [place]  visiting his daughter and he only had a few readings to provide due to leaving his log here at home. Patient reports his blood sugar was 125 this morning. Yesterday morning it was 138. Patient reports he has had a few of them lately that have been in the 160 range and some in the 110s range.   During the week, how often does your blood glucose drop below 70? Never Are you checking your feet daily/regularly? Yes. Patient checks and looks at his feet everyday.   Adherence Review: Is the patient currently on a STATIN medication? Yes Is the patient currently on ACE/ARB medication? Yes Does the patient have >5 day gap between last estimated fill dates? No  Notes: Spoke with patient and reviewed all medications as listed. Patient reports taking all medications as prescribed and no issues except his Vitamin D is a 2000 IU tablet and not 5000. Patient currently checks his blood sugar once a day and sometimes more or sometimes he forgets but hardly ever. Patient is currently in [place] visiting his daughter and forgot his log at home so he only had the last couple days numbers he could remember. Patient states that for breakfast he usually has a couple pieces of toast with jelly or he will have a couple eggs either scrambled, boiled or fried just depends on how he wants them. Patient stated for lunch he usually eats out with his friend anything from [place] to [employer]. Patient drinks plenty of water and for dinner he said it just varies and depends on whether he wants to make something quick or just eating out. He said it can anything from  a salad to a cheeseburger to a hamburger steak. Patient did state he does not eat dinner as much since it got hotter outside because it takes his appetite away. Patient stated he goes to the [facility] everyday and walks a couple miles in the treadmill and utilizes all the exercise machines and weights for strength training. Patient also stated the even thought he  dislikes doing it he does still do all the yardwork at home. Patient thanked me for my call.   Star Rating Drugs:  Atorvastatin 40mg  - last filled on 01/24/21 90[facility] at [facility] Glipizide 5mg  - last filled on 02/01/21 [facility] at [facility] Lisinopril 20mg  - last filled on 02/01/21 90[facility] at [facility] Metformin 1000mg  - last filled on 11/03/20 90DS at [place] [phone]

## 2021-02-19 NOTE — Telephone Encounter (Cosign Needed)
2nd attempt

## 2021-02-22 DIAGNOSIS — H35372 Puckering of macula, left eye: Secondary | ICD-10-CM | POA: Diagnosis not present

## 2021-02-22 DIAGNOSIS — E113293 Type 2 diabetes mellitus with mild nonproliferative diabetic retinopathy without macular edema, bilateral: Secondary | ICD-10-CM | POA: Diagnosis not present

## 2021-02-22 DIAGNOSIS — H353133 Nonexudative age-related macular degeneration, bilateral, advanced atrophic without subfoveal involvement: Secondary | ICD-10-CM | POA: Diagnosis not present

## 2021-02-22 DIAGNOSIS — H34811 Central retinal vein occlusion, right eye, with macular edema: Secondary | ICD-10-CM | POA: Diagnosis not present

## 2021-03-14 ENCOUNTER — Encounter: Payer: Self-pay | Admitting: Adult Health

## 2021-03-14 ENCOUNTER — Other Ambulatory Visit: Payer: Self-pay

## 2021-03-14 ENCOUNTER — Ambulatory Visit: Payer: Medicare Other | Admitting: Family Medicine

## 2021-03-14 ENCOUNTER — Ambulatory Visit (INDEPENDENT_AMBULATORY_CARE_PROVIDER_SITE_OTHER): Payer: Medicare Other | Admitting: Adult Health

## 2021-03-14 VITALS — BP 122/80 | HR 97 | Temp 97.6°F | Ht 71.0 in | Wt 254.0 lb

## 2021-03-14 DIAGNOSIS — L03113 Cellulitis of right upper limb: Secondary | ICD-10-CM

## 2021-03-14 MED ORDER — DOXYCYCLINE HYCLATE 100 MG PO CAPS: 100.0000 mg | ORAL_CAPSULE | Freq: Two times a day (BID) | ORAL | 0 refills | Status: DC

## 2021-03-14 NOTE — Patient Instructions (Signed)
I am going to treat you for cellulitis with an antibiotic called Doxycycline.   Please follow up on Tuesday to make sure this I getting better.

## 2021-03-14 NOTE — Progress Notes (Signed)
Subjective:    Patient ID: Charles [name], male    DOB: 1941-03-13, 80 y.o.   MRN: [id number]  HPI  80 year old male who  has a past medical history of Anemia, Arthritis, AVM (arteriovenous malformation) of colon, Cancer of kidney ([place]) (2000), Coronary artery disease, Dysrhythmia, Eczema, GERD (gastroesophageal reflux disease), Hernia, ventral, History of blood transfusion, Hyperlipidemia, Hypertension, Myocardial infarction ([place]), Neuropathy, Obesity, Personal history of adenomatous colonic polyps (07/20/2012), Pneumonia (1942; 1950s X 1), Type II diabetes mellitus ([place]), and Walking pneumonia (2000's X 1).  He presents to the office today for for an acute issue that he first noticed 1 day ago.  Reports pain, redness, warmth, and swelling from mid forearm up towards the axilla on the inside of his right arm.  Denies bug bite, trauma to the arm, shortness of breath, or numbness/tingling in his right hand.  Review of Systems See HPI   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[place]) 2000   left nephrectomy   Coronary artery disease    Dysrhythmia    PAF   Eczema    GERD (gastroesophageal reflux disease)    Hernia, ventral    History of blood transfusion    Hyperlipidemia    Hypertension    Myocardial infarction ([place])    Neuropathy    Obesity    Personal history of adenomatous colonic polyps 07/20/2012   3 + adenomas 2009 07/20/2012 2 diminutive polyps     Pneumonia 1942; 1950s X 1   Type II diabetes mellitus ([place])    type II   Walking pneumonia 2000's X 1    Social History   Socioeconomic History   Marital status: Widowed    Spouse name: Not on file   Number of children: 3   Years of education: Not on file   Highest education level: Not on file  Occupational History   Occupation: retired  Tobacco Use   Smoking  status: Former    Years: 20.00    Pack years: 0.00    Types: Cigarettes, Pipe, Cigars    Quit date: 09/09/1968    Years since quitting: 52.5   Smokeless tobacco: Never   Tobacco comments:    smoking cigars- quit in 2006  , quit pipe 2000  Vaping Use   Vaping Use: Never used  Substance and Sexual Activity   Alcohol use: Yes    Alcohol/week: 2.0 standard drinks    Types: 2 Glasses of wine per week    Comment: 2 glasses on Tuesday   Drug use: No   Sexual activity: Never  Other Topics Concern   Not on file  Social History Narrative   Regular exercise: [facility] to the [facility] and walks   Caffeine use: daily; coffee   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     widowed    Three children, One in [place], one in [place] one in [place] - 2 : youngest daughter lives with him   Social Determinants of Health   Financial Resource Strain: Low Risk    Difficulty of Paying Living Expenses: Not hard at all  Food Insecurity: No Food Insecurity   Worried About Charity fundraiser in the Last Year: Never true   Ran  Out of Food in the Last Year: Never true  Transportation Needs: No Transportation Needs   Lack of Transportation (Medical): No   Lack of Transportation (Non-Medical): No  Physical Activity: Sufficiently Active   Days of Exercise per Week: 5 days   Minutes of Exercise per Session: 120 min  Stress: No Stress Concern Present   Feeling of Stress : Not at all  Social Connections: Moderately Isolated   Frequency of Communication with Friends and Family: More than three times a week   Frequency of Social Gatherings with Friends and Family: More than three times a week   Attends Religious Services: More than 4 times per year   Active Member of [employer] or Organizations: No   Attends Archivist Meetings: Never   Marital Status: Widowed  Intimate Partner Violence: Not At Risk   Fear of Current or Ex-Partner: No   Emotionally Abused: No   Physically Abused: No    Sexually Abused: No    Past Surgical History:  Procedure Laterality Date   CARDIAC CATHETERIZATION N/A 10/19/2015   Procedure: Left Heart Cath and Coronary Angiography;  Surgeon: Charolette Forward, MD;  Location: [facility];  Service: Cardiovascular;  Laterality: N/A;   CARDIAC CATHETERIZATION N/A 10/19/2015   Procedure: Coronary Stent Intervention;  Surgeon: Charolette Forward, MD;  Location: [facility];  Service: Cardiovascular;  Laterality: N/A;   CATARACT EXTRACTION, BILATERAL Bilateral 01/2018   Dr. Kathrin Penner did surgery and he developed MD and is seeing specialist now for MD treatment   COLONOSCOPY     "nothing showed up this time"   COLONOSCOPY W/ BIOPSIES AND POLYPECTOMY  X 1   CORONARY ANGIOPLASTY     EXCISION MASS NECK Left 01/08/2021   Procedure: EXCISION MASS NECK;  Surgeon: Melida Quitter, MD;  Location: [facility];  Service: ENT;  Laterality: Left;   FEMUR IM NAIL Right 07/26/2013   Procedure: INTRAMEDULLARY (IM) NAIL FEMORAL subtrochanteric;  Surgeon: Mauri Pole, MD;  Location: [facility];  Service: Orthopedics;  Laterality: Right;   FRACTURE SURGERY     INGUINAL HERNIA REPAIR Left >3 times   INGUINAL HERNIA REPAIR Right 2012   NEPHRECTOMY Left 2000   TONSILLECTOMY  1940s    Family History  Problem Relation Age of Onset   Drug abuse Other    Cancer Other    Heart disease Other    Lung disease Other    Diabetes Mother    Diabetes Sister    Colon cancer Neg Hx    Stomach cancer Neg Hx     Allergies  Allergen Reactions   Bee Venom Anaphylaxis    Current Outpatient Medications on File Prior to Visit  Medication Sig Dispense Refill   aspirin 81 MG chewable tablet Chew 81 mg by mouth in the morning.     atorvastatin (LIPITOR) 40 MG tablet Take 1 tablet (40 mg total) by mouth daily at 6 PM. 30 tablet 3   Cholecalciferol (VITAMIN D-3) 125 MCG (5000 UT) TABS Take 5,000 Units by mouth in the morning.     Cyanocobalamin (VITAMIN B-12) 5000 MCG SUBL Place 5,000 mcg  under the tongue every 3 (three) days.     ELIQUIS 5 MG TABS tablet Take 5 mg by mouth 2 (two) times daily.     EPINEPHrine (EPI-PEN) 0.3 mg/0.3 mL DEVI Inject 0.3 mg into the muscle daily as needed (allergic reaction).     gabapentin (NEURONTIN) 100 MG capsule TAKE 1 CAPSULE BY MOUTH AT  BEDTIME (Patient taking differently: Take 100 mg by mouth at bedtime.) 90 capsule 3   glipiZIDE (GLUCOTROL) 5 MG tablet TAKE 1 TABLET BY MOUTH 2  TIMES DAILY BEFORE A MEAL (Patient taking differently: Take 5 mg by mouth 2 (two) times daily before a meal.) 180 tablet 3   Insulin Syringes, Disposable, U-100 0.5 ML MISC Use 1x a day 100 each 3   lisinopril (ZESTRIL) 20 MG tablet Take 20 mg by mouth in the morning.     metFORMIN (GLUCOPHAGE) 1000 MG tablet TAKE 1 TABLET BY MOUTH  TWICE DAILY WITH MEALS (Patient taking differently: Take 1,000 mg by mouth in the morning and at bedtime.) 180 tablet 3   metoprolol succinate (TOPROL-XL) 100 MG 24 hr tablet Take 50-100 mg by mouth See admin instructions. Take 1 tablet (100 mg) gy mouth in the morning & take 0.5 tablet (50 mg) by mouth in the evening.     Multiple Vitamins-Minerals (PRESERVISION AREDS 2 PO) Take 1 tablet by mouth in the morning and at bedtime.     nitroGLYCERIN (NITROSTAT) 0.4 MG SL tablet Place 1 tablet (0.4 mg total) under the tongue every 5 (five) minutes as needed for chest pain. 25 tablet 1   ONETOUCH DELICA LANCETS FINE MISC Use to check sugar daily 100 each 5   ONETOUCH VERIO test strip USE 1 STRIP TO CHECK GLUCOSE ONCE DAILY 100 each 3   spironolactone (ALDACTONE) 25 MG tablet Take 25 mg by mouth in the morning.     tamsulosin (FLOMAX) 0.4 MG CAPS capsule TAKE 1 CAPSULE BY MOUTH  DAILY (Patient taking differently: Take 0.4 mg by mouth in the morning.) 90 capsule 3   tamsulosin (FLOMAX) 0.4 MG CAPS capsule Take 0.4 mg by mouth.     No current facility-administered medications on file prior to visit.    BP 122/80   Pulse 97   Temp 97.6 F (36.4 C)  (Oral)   Ht 5\' 11"  (1.803 m)   Wt 254 lb (115.2 kg)   SpO2 95%   BMI 35.43 kg/m       Objective:   Physical Exam Vitals and nursing note reviewed.  Constitutional:      Appearance: Normal appearance.  Skin:    General: Skin is warm.     Capillary Refill: Capillary refill takes less than 2 seconds.     Findings: Erythema present.       Neurological:     General: No focal deficit present.     Mental Status: He is alert and oriented to person, place, and time.  Psychiatric:        Mood and Affect: Mood normal.        Behavior: Behavior normal.        Thought Content: Thought content normal.      Assessment & Plan:  1. Cellulitis of right upper extremity -Appears a cellulitis.  I will start him on doxycycline twice daily x10 days.  We will have him follow-up early next week for reevaluation. - doxycycline (VIBRAMYCIN) 100 MG capsule; Take 1 capsule (100 mg total) by mouth 2 (two) times daily.  Dispense: 20 capsule; Refill: 0   [name], NP

## 2021-03-15 ENCOUNTER — Ambulatory Visit (INDEPENDENT_AMBULATORY_CARE_PROVIDER_SITE_OTHER): Payer: Medicare Other

## 2021-03-15 DIAGNOSIS — E785 Hyperlipidemia, unspecified: Secondary | ICD-10-CM

## 2021-03-15 DIAGNOSIS — I10 Essential (primary) hypertension: Secondary | ICD-10-CM

## 2021-03-15 DIAGNOSIS — E1159 Type 2 diabetes mellitus with other circulatory complications: Secondary | ICD-10-CM

## 2021-03-15 DIAGNOSIS — E1165 Type 2 diabetes mellitus with hyperglycemia: Secondary | ICD-10-CM

## 2021-03-15 DIAGNOSIS — I4891 Unspecified atrial fibrillation: Secondary | ICD-10-CM | POA: Diagnosis not present

## 2021-03-15 NOTE — Patient Instructions (Signed)
Visit Information  PATIENT GOALS:  Goals Addressed             This Visit's Progress    RNCM: Better self manage and track my diabetes -Diabetes Type 2   On track    Timeframe:  Long-Range Goal Priority:  Medium Start Date:    10/18/20                       Expected End Date:        07/09/21            Follow Up Date by 05/07/21    - check blood sugar at prescribed times try checking 1 1/2-2 hours after eating -try checking blood sugar after eating at different times -recheck blood sugar if you get an unexpected number - check blood sugar before and after exercise - check blood sugar if I feel it is too high or too low - enter blood sugar readings and medication or insulin into daily log - take the blood sugar log to all doctor visits - take the blood sugar meter to all doctor visits   -keep glucose tablets or some other fast acting sugar on hand to treat low blood sugars Why is this important?   Checking your blood sugar at home helps to keep it from getting very high or very low.  Writing the results in a diary or log helps the doctor know how to care for you.  Your blood sugar log should have the time, date and the results.  Also, write down the amount of insulin or other medicine that you take.  Other information, like what you ate, exercise done and how you were feeling, will also be helpful.     Notes:       RNCM: Develop and follow self management plan for Atrial Fibrillation   On track    Timeframe:  Long-Range Goal Priority:  High Start Date:      10/18/20                       Expected End Date:      07/09/21                 Follow Up Date 05/07/21    - check pulse (heart) rate once a day - cut down alcohol use - make a plan to exercise regularly - make a plan to eat healthy - take medicine as prescribed  -review atrial fibrillation action plan -take anticoagulant to prevent stroke -take log of B/P and weights to cardiology visit Why is this important?    Atrial fibrillation may have no symptoms. Sometimes the symptoms get worse or happen more often.  It is important to keep track of what your symptoms are and when they happen.  A change in symptoms is important to discuss with your doctor or nurse.  Being active and healthy eating will also help you manage your heart condition.     Notes:          Patient verbalizes understanding of instructions provided today and agrees to view in [facility].   Telephone follow up appointment with care management team member scheduled for:  Peter Garter RN, [facility], CDE Care Management Coordinator [place] [phone], Mobile [phone]

## 2021-03-15 NOTE — Chronic Care Management (AMB) (Signed)
Chronic Care Management   CCM RN Visit Note  03/15/2021 Name: Charles [name] MRN: [id number] DOB: March 18, 1941  Subjective: Charles [name] is a 80 y.o. year old male who is a primary care patient of [name], NP. The care management team was consulted for assistance with disease management and care coordination needs.    Engaged with patient by telephone for follow up visit in response to provider referral for case management and/or care coordination services.   Consent to Services:  The patient was given information about Chronic Care Management services, agreed to services, and gave verbal consent prior to initiation of services.  Please see initial visit note for detailed documentation.   Patient agreed to services and verbal consent obtained.   Assessment: Review of patient past medical history, allergies, medications, health status, including review of consultants reports, laboratory and other test data, was performed as part of comprehensive evaluation and provision of chronic care management services.   SDOH (Social Determinants of Health) assessments and interventions performed:    CCM Care Plan  Allergies  Allergen Reactions   Bee Venom Anaphylaxis    Outpatient Encounter Medications as of 03/15/2021  Medication Sig   aspirin 81 MG chewable tablet Chew 81 mg by mouth in the morning.   atorvastatin (LIPITOR) 40 MG tablet Take 1 tablet (40 mg total) by mouth daily at 6 PM.   Cholecalciferol (VITAMIN D-3) 125 MCG (5000 UT) TABS Take 5,000 Units by mouth in the morning.   Cyanocobalamin (VITAMIN B-12) 5000 MCG SUBL Place 5,000 mcg under the tongue every 3 (three) days.   doxycycline (VIBRAMYCIN) 100 MG capsule Take 1 capsule (100 mg total) by mouth 2 (two) times daily.   ELIQUIS 5 MG TABS tablet Take 5 mg by mouth 2 (two) times daily.   EPINEPHrine (EPI-PEN) 0.3 mg/0.3 mL DEVI Inject 0.3 mg into the muscle daily as needed (allergic reaction).   gabapentin (NEURONTIN) 100 MG  capsule TAKE 1 CAPSULE BY MOUTH AT  BEDTIME (Patient taking differently: Take 100 mg by mouth at bedtime.)   glipiZIDE (GLUCOTROL) 5 MG tablet TAKE 1 TABLET BY MOUTH 2  TIMES DAILY BEFORE A MEAL (Patient taking differently: Take 5 mg by mouth 2 (two) times daily before a meal.)   Insulin Syringes, Disposable, U-100 0.5 ML MISC Use 1x a day   lisinopril (ZESTRIL) 20 MG tablet Take 20 mg by mouth in the morning.   metFORMIN (GLUCOPHAGE) 1000 MG tablet TAKE 1 TABLET BY MOUTH  TWICE DAILY WITH MEALS (Patient taking differently: Take 1,000 mg by mouth in the morning and at bedtime.)   metoprolol succinate (TOPROL-XL) 100 MG 24 hr tablet Take 50-100 mg by mouth See admin instructions. Take 1 tablet (100 mg) gy mouth in the morning & take 0.5 tablet (50 mg) by mouth in the evening.   Multiple Vitamins-Minerals (PRESERVISION AREDS 2 PO) Take 1 tablet by mouth in the morning and at bedtime.   nitroGLYCERIN (NITROSTAT) 0.4 MG SL tablet Place 1 tablet (0.4 mg total) under the tongue every 5 (five) minutes as needed for chest pain.   ONETOUCH DELICA LANCETS FINE MISC Use to check sugar daily   ONETOUCH VERIO test strip USE 1 STRIP TO CHECK GLUCOSE ONCE DAILY   spironolactone (ALDACTONE) 25 MG tablet Take 25 mg by mouth in the morning.   tamsulosin (FLOMAX) 0.4 MG CAPS capsule TAKE 1 CAPSULE BY MOUTH  DAILY (Patient taking differently: Take 0.4 mg by mouth in the morning.)   tamsulosin (  FLOMAX) 0.4 MG CAPS capsule Take 0.4 mg by mouth.   No facility-administered encounter medications on file as of 03/15/2021.    Patient Active Problem List   Diagnosis Date Noted   Neck mass 01/08/2021   Atrial fibrillation ([place]) 10/25/2020   Parotid mass 10/25/2020   Epistaxis 09/29/2018   Hyperkalemia 09/29/2018   AKI (acute kidney injury) ([place]) 09/29/2018   Lumbar radiculopathy 08/06/2017   Greater trochanteric bursitis of left hip 07/04/2017   Rotator cuff arthropathy of left shoulder 01/24/2017   AC  (acromioclavicular) arthritis 01/24/2017   Vitamin B12 deficiency 01/18/2016   NSTEMI (non-ST elevated myocardial infarction) ([place]) 10/19/2015   Acute non Q wave MI (myocardial infarction), initial episode of care ([place]) 10/19/2015   Peripheral neuropathy 10/13/2015   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[place]) 07/14/2015   Actinic keratosis of right cheek 10/04/2014   Contact dermatitis and eczema 02/10/2014   Secondary renovascular hypertension, 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   Acute blood loss anemia 08/27/2013   Insomnia 08/27/2013   Hip fracture, right ([place]) 07/25/2013   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   Renal cell carcinoma ([place]) 07/25/2013   History of colonic polyps 07/20/2012   Hyperlipidemia 11/13/2007   Obesity 11/13/2007   Essential hypertension 04/23/2007    Conditions to be addressed/monitored:Atrial Fibrillation, HTN, HLD, and DMII  Care Plan : RNCM:Atrial Fibrillation (Adult)  Updates made by Dimitri Ped, RN since 03/15/2021 12:00 AM     Problem: Lack of self mangement plan for Atrial Fibrillation   Priority: High     Long-Range Goal: RNCM: Develolp and self manage atrial fibrillation   Start Date: 10/18/2020  Expected End Date: 07/09/2021  This Visit's Progress: On track  Recent Progress: On track  Priority: High  Note:   Current Barriers:  Knowledge Deficits related to new dx of atrial fibrillation with hx of HTN and HLD -Reports no atrial fibrillation symptoms. Denies any shortness of breath or chest pain.  Reports taking Eliquis as directed.  Reports he saw Dr. Terrence Dupont on 02/08/21. States he keep him on the spirolactone.  States he will see him again in September  Denies any swelling and his weight has been stable.  States his B/P usually 120/70 when he checks it at the [facility]    Chronic Disease Management support and education needs related to Atrial fibrillation Nurse Case Manager Clinical Goal(s):  Over the next 120 days, patient will  verbalize understanding of plan for self management of atrial fibrillation Over the next 120 days, patient will verbalize basic understanding of atrial fibrillation disease process and self health management plan as evidenced by decrease in symptoms, adherence to medications, keeping provider appts  Interventions:  1:1 collaboration with [name], NP regarding development and update of comprehensive plan of care as evidenced by provider attestation and co-signature Inter-disciplinary care team collaboration (see longitudinal plan of care)  Reinforced patient to call provider for worsening symptoms of atrial fibrillation  Reinforced education to patient re: atrial fibrillation disease process and atrial fibrillation action plan  Reviewed medications with patient and discussed importance of taking medications as ordered to lower heart rate and taking anticoagulant Reviewed scheduled/upcoming provider appointments including: Dr. Terrence Dupont cardiology September 2022, Dr. Cruzita Lederer 05/01/21, Beaulah Dinning NP 03/20/21 Discussed plans with patient for ongoing care management follow up and provided patient with direct contact information for care management team Reinforced to keep a log of his weights and B/P to take to his appt with Dr.Harwani Patient Goals/Self-Care Activities Patient will attend  all scheduled provider appointments Patient will call provider office for new concerns or questions - check pulse (heart) rate once a day - cut down alcohol use - make a plan to exercise regularly - make a plan to eat healthy - take medicine as prescribed -review atrial fibrillation action plan -take anticoagulant to prevent stroke -take log of B/P and weights to cardiology visit Follow Up Plan: Telephone follow up appointment with care management team member scheduled for: 05/07/21 at 9 AM The patient has been provided with contact information for the care management team and has been advised to call with  any health related questions or concerns.          Care Plan : RNCM:Diabetes Type 2 (Adult)  Updates made by Dimitri Ped, RN since 03/15/2021 12:00 AM     Problem: RNCM: Lack of effective self management of Diabetes Type 2   Priority: Medium     Long-Range Goal: Effective self management of blood sugars and Diabetes Type 2   Start Date: 10/18/2020  Expected End Date: 07/09/2021  This Visit's Progress: On track  Recent Progress: On track  Priority: Medium  Note:   Objective:  Lab Results  Component Value Date   HGBA1C 7.9 (A) 08/21/2020   Lab Results  Component Value Date   CREATININE 1.06 10/06/2020   CREATININE 1.02 10/08/2019   CREATININE 1.21 10/26/2018  Current Barriers:  Knowledge Deficits related to basic Diabetes pathophysiology and self care/management with hx of HTN and atrial fib. Hemoglobin A1C was down to 7.3% on 12/19/20.  States his fasting readings range from 114-160.  States he was checking his after meal readings a few times but has not checked much in the last 3 weeks.  States they range in the low 110's.  States he did have a 75 reading around 5 PM and he was feeling that his sugar was low.  States he ate and he felt better.  States he saw his provider yesterday for redness and swelling on his rt forearm and inner elbow.  States he told him it was cellulitis and he started him on an antibiotic.  States it is slightly swollen but it is not getting worse this morning.  States he is to go back to see Tommi Rumps on 03/20/21 Does not adhere to provider recommendations re: Low CHO diet and portion control. Reports watching portions better and avoiding second helpings Reports going to gym 5 days a week walking 1-2 miles and working on exercise/weight machines Case Manager Clinical Goal(s):  patient will demonstrate improved adherence to prescribed treatment plan for diabetes self care/management as evidenced by: daily monitoring and recording of CBG  adherence to ADA/ carb  modified diet exercise 3 days/week adherence to prescribed medication regimen Interventions:  Collaboration with [name], NP regarding development and update of comprehensive plan of care as evidenced by provider attestation and co-signature Inter-disciplinary care team collaboration (see longitudinal plan of care) Reviewed education to patient about basic DM disease process Reviewed medications with patient and discussed importance of medication adherence Discussed plans with patient for ongoing care management follow up and provided patient with direct contact information for care management team Reviewed scheduled/upcoming provider appointments including: Dr. Terrence Dupont cardiology  September 2022 Dr. Cruzita Lederer endocrinologist 05/01/21, Dr. Tamala Julian sports med 03/22/21, [name] NP 03/20/21 Reinforced  to check cbg daily at varying times and record, calling provider for findings outside established parameters.   Reinforced importance of getting regular exercise with resistance training at least 2  times a week Reinforced the rule of 15 for hypoglycemia and discussed sources of fast acting sugar to have on hand Reviewed s/sx of worsening cellulitis  infection to call provider  Reviewed how infection and inflammation can increase his blood sugars and actions to take Patient Goals/Self-Care Activities Over the next 90 days, patient will:  - Self administers oral medications as prescribed Attends all scheduled provider appointments Checks blood sugars as prescribed and utilize hyper and hypoglycemia protocol as needed Adheres to prescribed ADA/carb modified - check blood sugar at prescribed times try checking 1 1/2-2 hours after eating -try checking blood sugar after eating at different times -recheck blood sugar if you get an unexpected number - check blood sugar before and after exercise - check blood sugar if I feel it is too high or too low - enter blood sugar readings and medication or  insulin into daily log - take the blood sugar log to all doctor visits - take the blood sugar meter to all doctor visits  -keep glucose tablets or some other fast acting sugar on hand to treat low blood sugars Follow Up Plan: Telephone follow up appointment with care management team member scheduled for: 05/07/21 at 9 AM The patient has been provided with contact information for the care management team and has been advised to call with any health related questions or concerns.       Plan:Telephone follow up appointment with care management team member scheduled for:  05/07/21 and The patient has been provided with contact information for the care management team and has been advised to call with any health related questions or concerns.  Peter Garter RN, [facility] [place], CDE Care Management Coordinator [facility] [phone], Mobile [phone]

## 2021-03-15 NOTE — Progress Notes (Signed)
Charles [name] [address] [place] Phone: [phone] Subjective:   I Charles [name] am serving as a Education administrator for Dr. Hulan Saas.  This visit occurred during the SARS-CoV-2 public health emergency.  Safety protocols were in place, including screening questions prior to the visit, additional usage of staff PPE, and extensive cleaning of exam room while observing appropriate contact time as indicated for disinfecting solutions.   I'm seeing this patient by the request  of:  [name], NP  CC: Back pain follow-up  EYC:[id number]  12/26/2020 Do believe that patient's pain is likely secondary to more of the L4-L5 degenerative disc disease causing more of a nerve impingement.  Discussed the gabapentin still at this time.  Patient does have some cramping of the legs at night that can be more secondary to sleep apnea.  Patient would like to decline a sleep study at this point because he would not want to wear a CPAP.  We discussed that this could be very beneficial for him.  Follow-up with me in 2 months.  If worsening pain can consider advanced imaging or formal physical therapy  Patient is having that removed in the near future.  We will discuss again after 6 weeks after his surgery to see how patient is doing with the back.  Worsening pain will consider the possibility of MRI of the back or formal physical therapy.  Follow-up again in 2 months  Update 03/22/2021 Charles [name] is a 80 y.o. male coming in with complaint of lumbar spine pain. Patient states his back still feels like a bad back. Doesn't believe he will ever get better. States the pain is less some days.  Reviewing patient's chart and also recently had the parotid mass that was benign removed.  Doing relatively well with this.  Also had a splinter she states in his right finger.  Then had streaking and was put on antibiotics.  Patient states it is getting better but still has redness and  some discomfort.  Was able to get a splinter out of his finger in the last couple days.     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[place]) 2000   left nephrectomy   Coronary artery disease    Dysrhythmia    PAF   Eczema    GERD (gastroesophageal reflux disease)    Hernia, ventral    History of blood transfusion    Hyperlipidemia    Hypertension    Myocardial infarction ([place])    Neuropathy    Obesity    Personal history of adenomatous colonic polyps 07/20/2012   3 + adenomas 2009 07/20/2012 2 diminutive polyps     Pneumonia 1942; 1950s X 1   Type II diabetes mellitus ([place])    type II   Walking pneumonia 2000's X 1   Past Surgical History:  Procedure Laterality Date   CARDIAC CATHETERIZATION N/A 10/19/2015   Procedure: Left Heart Cath and Coronary Angiography;  Surgeon: Charolette Forward, MD;  Location: [facility];  Service: Cardiovascular;  Laterality: N/A;   CARDIAC CATHETERIZATION N/A 10/19/2015   Procedure: Coronary Stent Intervention;  Surgeon: Charolette Forward, MD;  Location: [facility];  Service: Cardiovascular;  Laterality: N/A;   CATARACT EXTRACTION,  BILATERAL Bilateral 01/2018   Dr. Kathrin Penner did surgery and he developed MD and is seeing specialist now for MD treatment   COLONOSCOPY     "nothing showed up this time"   COLONOSCOPY W/ BIOPSIES AND POLYPECTOMY  X 1   CORONARY ANGIOPLASTY     EXCISION MASS NECK Left 01/08/2021   Procedure: EXCISION MASS NECK;  Surgeon: Melida Quitter, MD;  Location: [facility];  Service: ENT;  Laterality: Left;   FEMUR IM NAIL Right 07/26/2013   Procedure: INTRAMEDULLARY (IM) NAIL FEMORAL subtrochanteric;  Surgeon: Mauri Pole, MD;  Location: [facility];  Service: Orthopedics;  Laterality: Right;   FRACTURE SURGERY     INGUINAL HERNIA REPAIR Left >3 times   INGUINAL HERNIA REPAIR  Right 2012   NEPHRECTOMY Left 2000   TONSILLECTOMY  1940s   Social History   Socioeconomic History   Marital status: Widowed    Spouse name: Not on file   Number of children: 3   Years of education: Not on file   Highest education level: Not on file  Occupational History   Occupation: retired  Tobacco Use   Smoking status: Former    Years: 20.00    Types: Cigarettes, Pipe, Cigars    Quit date: 09/09/1968    Years since quitting: 52.5   Smokeless tobacco: Never   Tobacco comments:    smoking cigars- quit in 2006  , quit pipe 2000  Vaping Use   Vaping Use: Never used  Substance and Sexual Activity   Alcohol use: Yes    Alcohol/week: 2.0 standard drinks    Types: 2 Glasses of wine per week    Comment: 2 glasses on Tuesday   Drug use: No   Sexual activity: Never  Other Topics Concern   Not on file  Social History Narrative   Regular exercise: goes to the [facility] and walks   Caffeine use: daily; coffee   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     widowed    Three children, One in [place], one in [place] one in [place] - 2 : youngest daughter lives with him   Social Determinants of Health   Financial Resource Strain: Low Risk    Difficulty of Paying Living Expenses: Not hard at all  Food Insecurity: No Food Insecurity   Worried About Charity fundraiser in the Last Year: Never true   Arboriculturist in the Last Year: Never true  Transportation Needs: No Transportation Needs   Lack of Transportation (Medical): No   Lack of Transportation (Non-Medical): No  Physical Activity: Sufficiently Active   Days of Exercise per Week: 5 days   Minutes of Exercise per Session: 120 min  Stress: No Stress Concern Present   Feeling of Stress : Not at all  Social Connections: Moderately Isolated   Frequency of Communication with Friends and Family: More than three times a week   Frequency of Social Gatherings with Friends and Family: More than three times a week    Attends Religious Services: More than 4 times per year   Active Member of [employer] or Organizations: No   Attends Archivist Meetings: Never   Marital Status: Widowed   Allergies  Allergen Reactions   Bee Venom Anaphylaxis   Family History  Problem Relation Age of Onset   Drug abuse Other    Cancer Other    Heart disease Other    Lung disease Other    Diabetes  Mother    Diabetes Sister    Colon cancer Neg Hx    Stomach cancer Neg Hx     Current Outpatient Medications (Endocrine & Metabolic):    glipiZIDE (GLUCOTROL) 5 MG tablet, TAKE 1 TABLET BY MOUTH 2  TIMES DAILY BEFORE A MEAL (Patient taking differently: Take 5 mg by mouth 2 (two) times daily before a meal.)   metFORMIN (GLUCOPHAGE) 1000 MG tablet, TAKE 1 TABLET BY MOUTH  TWICE DAILY WITH MEALS (Patient taking differently: Take 1,000 mg by mouth in the morning and at bedtime.)  Current Outpatient Medications (Cardiovascular):    atorvastatin (LIPITOR) 40 MG tablet, Take 1 tablet (40 mg total) by mouth daily at 6 PM.   EPINEPHrine (EPI-PEN) 0.3 mg/0.3 mL DEVI, Inject 0.3 mg into the muscle daily as needed (allergic reaction).   lisinopril (ZESTRIL) 20 MG tablet, Take 20 mg by mouth in the morning.   metoprolol succinate (TOPROL-XL) 100 MG 24 hr tablet, Take 50-100 mg by mouth See admin instructions. Take 1 tablet (100 mg) gy mouth in the morning & take 0.5 tablet (50 mg) by mouth in the evening.   nitroGLYCERIN (NITROSTAT) 0.4 MG SL tablet, Place 1 tablet (0.4 mg total) under the tongue every 5 (five) minutes as needed for chest pain.   spironolactone (ALDACTONE) 25 MG tablet, Take 25 mg by mouth in the morning.   Current Outpatient Medications (Analgesics):    aspirin 81 MG chewable tablet, Chew 81 mg by mouth in the morning.  Current Outpatient Medications (Hematological):    Cyanocobalamin (VITAMIN B-12) 5000 MCG SUBL, Place 5,000 mcg under the tongue every 3 (three) days.   ELIQUIS 5 MG TABS tablet, Take 5 mg by  mouth 2 (two) times daily.  Current Outpatient Medications (Other):    Cholecalciferol (VITAMIN D-3) 125 MCG (5000 UT) TABS, Take 5,000 Units by mouth in the morning.   doxycycline (VIBRA-TABS) 100 MG tablet, Take 1 tablet (100 mg total) by mouth 2 (two) times daily.   doxycycline (VIBRAMYCIN) 100 MG capsule, Take 1 capsule (100 mg total) by mouth 2 (two) times daily.   gabapentin (NEURONTIN) 100 MG capsule, TAKE 1 CAPSULE BY MOUTH AT  BEDTIME (Patient taking differently: Take 100 mg by mouth at bedtime.)   Insulin Syringes, Disposable, U-100 0.5 ML MISC, Use 1x a day   Multiple Vitamins-Minerals (PRESERVISION AREDS 2 PO), Take 1 tablet by mouth in the morning and at bedtime.   ONETOUCH DELICA LANCETS FINE MISC, Use to check sugar daily   ONETOUCH VERIO test strip, USE 1 STRIP TO CHECK GLUCOSE ONCE DAILY   tamsulosin (FLOMAX) 0.4 MG CAPS capsule, TAKE 1 CAPSULE BY MOUTH  DAILY (Patient taking differently: Take 0.4 mg by mouth in the morning.)   tamsulosin (FLOMAX) 0.4 MG CAPS capsule, Take 0.4 mg by mouth.   Reviewed prior external information including notes and imaging from  primary care provider As well as notes that were available from care everywhere and other healthcare systems.  Past medical history, social, surgical and family history all reviewed in electronic medical record.  No pertanent information unless stated regarding to the chief complaint.   Review of Systems:  No headache, visual changes, nausea, vomiting, diarrhea, constipation, dizziness, abdominal pain, skin rash, fevers, chills, night sweats, weight loss, swollen lymph nodes, body aches, joint swelling, chest pain, shortness of breath, mood changes. POSITIVE muscle aches  Objective  Blood pressure 140/82, pulse 76, height 5\' 11"  (1.803 m), weight 255 lb (115.7 kg), SpO2 97 %.  General: No apparent distress alert and oriented x3 mood and affect normal, dressed appropriately.  HEENT: Pupils equal, extraocular  movements intact  Respiratory: Patient's speak in full sentences and does not appear short of breath  Cardiovascular: No lower extremity edema, non tender, no erythema  Gait normal with good balance and coordination.  MSK: Patient's right elbow does have fullness compared to the contralateral side.  Patient does still have some mild erythema of the skin but do not see the streaking noted.  Patient's ring finger had an area that seem to be where the injury occurred and still has some mild soft tissue swelling noted. Back exam does have some loss of lordosis, some tenderness to palpation.  Tightness with FABER test bilaterally and tightness with straight leg test but no radicular symptoms.  Neurovascular intact but mild neuropathy at baseline    Impression and Recommendations:    The above documentation has been reviewed and is accurate and complete Lyndal Pulley, DO

## 2021-03-20 ENCOUNTER — Ambulatory Visit (INDEPENDENT_AMBULATORY_CARE_PROVIDER_SITE_OTHER): Payer: Medicare Other | Admitting: Adult Health

## 2021-03-20 ENCOUNTER — Other Ambulatory Visit: Payer: Self-pay

## 2021-03-20 ENCOUNTER — Encounter: Payer: Self-pay | Admitting: Adult Health

## 2021-03-20 VITALS — BP 120/60 | HR 76 | Temp 97.0°F | Ht 71.0 in | Wt 253.0 lb

## 2021-03-20 DIAGNOSIS — L089 Local infection of the skin and subcutaneous tissue, unspecified: Secondary | ICD-10-CM | POA: Diagnosis not present

## 2021-03-20 DIAGNOSIS — L03113 Cellulitis of right upper limb: Secondary | ICD-10-CM

## 2021-03-20 NOTE — Progress Notes (Signed)
Subjective:    Patient ID: Charles [name], male    DOB: 30-Nov-1940, 80 y.o.   MRN: [id number]  HPI  80 year old male who  has a past medical history of Anemia, Arthritis, AVM (arteriovenous malformation) of colon, Cancer of kidney ([place]) (2000), Coronary artery disease, Dysrhythmia, Eczema, GERD (gastroesophageal reflux disease), Hernia, ventral, History of blood transfusion, Hyperlipidemia, Hypertension, Myocardial infarction ([place]), Neuropathy, Obesity, Personal history of adenomatous colonic polyps (07/20/2012), Pneumonia (1942; 1950s X 1), Type II diabetes mellitus ([place]), and Walking pneumonia (2000's X 1).  Resents to the office today for 1 week follow-up regarding cellulitis of his right upper extremity.  He was placed on doxycycline 100 mg twice daily for 10 days.  Today he reports that his symptoms are improving on his right forearm.  He also reports that earlier this week his right index finger came red and swollen.  He was eventually able to remove a splinter yesterday from his finger.  Reports that his finger continues to be sore but is less red and swollen as it has been recently.   Review of Systems See HPI   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[place]) 2000   left nephrectomy   Coronary artery disease    Dysrhythmia    PAF   Eczema    GERD (gastroesophageal reflux disease)    Hernia, ventral    History of blood transfusion    Hyperlipidemia    Hypertension    Myocardial infarction ([place])    Neuropathy    Obesity    Personal history of adenomatous colonic polyps 07/20/2012   3 + adenomas 2009 07/20/2012 2 diminutive polyps     Pneumonia 1942; 1950s X 1   Type II diabetes mellitus ([place])    type II   Walking pneumonia 2000's X 1    Social History   Socioeconomic History   Marital status: Widowed    Spouse name:  Not on file   Number of children: 3   Years of education: Not on file   Highest education level: Not on file  Occupational History   Occupation: retired  Tobacco Use   Smoking status: Former    Years: 20.00    Pack years: 0.00    Types: Cigarettes, Pipe, Cigars    Quit date: 09/09/1968    Years since quitting: 52.5   Smokeless tobacco: Never   Tobacco comments:    smoking cigars- quit in 2006  , quit pipe 2000  Vaping Use   Vaping Use: Never used  Substance and Sexual Activity   Alcohol use: Yes    Alcohol/week: 2.0 standard drinks    Types: 2 Glasses of wine per week    Comment: 2 glasses on Tuesday   Drug use: No   Sexual activity: Never  Other Topics Concern   Not on file  Social History Narrative   Regular exercise: goes to the [facility] and walks   Caffeine use: daily; coffee   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     widowed    Three children, One in [place], one in [place] one in [place] - 2 : youngest daughter lives with him   Social Determinants of Health   Financial Resource Strain: Low  Risk    Difficulty of Paying Living Expenses: Not hard at all  Food Insecurity: No Food Insecurity   Worried About [place] in the Last Year: Never true   Ran Out of Food in the Last Year: Never true  Transportation Needs: No Transportation Needs   Lack of Transportation (Medical): No   Lack of Transportation (Non-Medical): No  Physical Activity: Sufficiently Active   Days of Exercise per Week: 5 days   Minutes of Exercise per Session: 120 min  Stress: No Stress Concern Present   Feeling of Stress : Not at all  Social Connections: Moderately Isolated   Frequency of Communication with Friends and Family: More than three times a week   Frequency of Social Gatherings with Friends and Family: More than three times a week   Attends Religious Services: More than 4 times per year   Active Member of [employer] or Organizations: No   Attends Theatre manager Meetings: Never   Marital Status: Widowed  Intimate Partner Violence: Not At Risk   Fear of Current or Ex-Partner: No   Emotionally Abused: No   Physically Abused: No   Sexually Abused: No    Past Surgical History:  Procedure Laterality Date   CARDIAC CATHETERIZATION N/A 10/19/2015   Procedure: Left Heart Cath and Coronary Angiography;  Surgeon: Charolette Forward, MD;  Location: [facility];  Service: Cardiovascular;  Laterality: N/A;   CARDIAC CATHETERIZATION N/A 10/19/2015   Procedure: Coronary Stent Intervention;  Surgeon: Charolette Forward, MD;  Location: [facility];  Service: Cardiovascular;  Laterality: N/A;   CATARACT EXTRACTION, BILATERAL Bilateral 01/2018   Dr. Kathrin Penner did surgery and he developed MD and is seeing specialist now for MD treatment   COLONOSCOPY     "nothing showed up this time"   COLONOSCOPY W/ BIOPSIES AND POLYPECTOMY  X 1   CORONARY ANGIOPLASTY     EXCISION MASS NECK Left 01/08/2021   Procedure: EXCISION MASS NECK;  Surgeon: Melida Quitter, MD;  Location: [facility];  Service: ENT;  Laterality: Left;   FEMUR IM NAIL Right 07/26/2013   Procedure: INTRAMEDULLARY (IM) NAIL FEMORAL subtrochanteric;  Surgeon: Mauri Pole, MD;  Location: [facility];  Service: Orthopedics;  Laterality: Right;   FRACTURE SURGERY     INGUINAL HERNIA REPAIR Left >3 times   INGUINAL HERNIA REPAIR Right 2012   NEPHRECTOMY Left 2000   TONSILLECTOMY  1940s    Family History  Problem Relation Age of Onset   Drug abuse Other    Cancer Other    Heart disease Other    Lung disease Other    Diabetes Mother    Diabetes Sister    Colon cancer Neg Hx    Stomach cancer Neg Hx     Allergies  Allergen Reactions   Bee Venom Anaphylaxis    Current Outpatient Medications on File Prior to Visit  Medication Sig Dispense Refill   aspirin 81 MG chewable tablet Chew 81 mg by mouth in the morning.     atorvastatin (LIPITOR) 40 MG tablet Take 1 tablet (40 mg total) by mouth daily  at 6 PM. 30 tablet 3   Cholecalciferol (VITAMIN D-3) 125 MCG (5000 UT) TABS Take 5,000 Units by mouth in the morning.     Cyanocobalamin (VITAMIN B-12) 5000 MCG SUBL Place 5,000 mcg under the tongue every 3 (three) days.     doxycycline (VIBRAMYCIN) 100 MG capsule Take 1 capsule (100 mg total) by mouth 2 (two)  times daily. 20 capsule 0   ELIQUIS 5 MG TABS tablet Take 5 mg by mouth 2 (two) times daily.     EPINEPHrine (EPI-PEN) 0.3 mg/0.3 mL DEVI Inject 0.3 mg into the muscle daily as needed (allergic reaction).     gabapentin (NEURONTIN) 100 MG capsule TAKE 1 CAPSULE BY MOUTH AT  BEDTIME (Patient taking differently: Take 100 mg by mouth at bedtime.) 90 capsule 3   glipiZIDE (GLUCOTROL) 5 MG tablet TAKE 1 TABLET BY MOUTH 2  TIMES DAILY BEFORE A MEAL (Patient taking differently: Take 5 mg by mouth 2 (two) times daily before a meal.) 180 tablet 3   Insulin Syringes, Disposable, U-100 0.5 ML MISC Use 1x a day 100 each 3   lisinopril (ZESTRIL) 20 MG tablet Take 20 mg by mouth in the morning.     metFORMIN (GLUCOPHAGE) 1000 MG tablet TAKE 1 TABLET BY MOUTH  TWICE DAILY WITH MEALS (Patient taking differently: Take 1,000 mg by mouth in the morning and at bedtime.) 180 tablet 3   metoprolol succinate (TOPROL-XL) 100 MG 24 hr tablet Take 50-100 mg by mouth See admin instructions. Take 1 tablet (100 mg) gy mouth in the morning & take 0.5 tablet (50 mg) by mouth in the evening.     Multiple Vitamins-Minerals (PRESERVISION AREDS 2 PO) Take 1 tablet by mouth in the morning and at bedtime.     nitroGLYCERIN (NITROSTAT) 0.4 MG SL tablet Place 1 tablet (0.4 mg total) under the tongue every 5 (five) minutes as needed for chest pain. 25 tablet 1   ONETOUCH DELICA LANCETS FINE MISC Use to check sugar daily 100 each 5   ONETOUCH VERIO test strip USE 1 STRIP TO CHECK GLUCOSE ONCE DAILY 100 each 3   spironolactone (ALDACTONE) 25 MG tablet Take 25 mg by mouth in the morning.     tamsulosin (FLOMAX) 0.4 MG CAPS capsule TAKE  1 CAPSULE BY MOUTH  DAILY (Patient taking differently: Take 0.4 mg by mouth in the morning.) 90 capsule 3   tamsulosin (FLOMAX) 0.4 MG CAPS capsule Take 0.4 mg by mouth.     No current facility-administered medications on file prior to visit.    BP 120/60   Pulse 76   Temp (!) 97 F (36.1 C) (Oral)   Ht 5\' 11"  (1.803 m)   Wt 253 lb (114.8 kg)   SpO2 95%   BMI 35.29 kg/m       Objective:   Physical Exam Vitals and nursing note reviewed.  Constitutional:      Appearance: Normal appearance.  Cardiovascular:     Rate and Rhythm: Normal rate and regular rhythm.     Pulses: Normal pulses.     Heart sounds: Normal heart sounds.  Pulmonary:     Effort: Pulmonary effort is normal.     Breath sounds: Normal breath sounds.  Skin:    General: Skin is warm and dry.     Capillary Refill: Capillary refill takes less than 2 seconds.     Findings: Erythema present.     Comments: Continues to have trace erythema on the right forearm and upper arm, swelling and tenderness have greatly improved.  His right index finger does have erythema, swelling, and pain with palpation.  No Paronychia noted at this time  Neurological:     General: No focal deficit present.     Mental Status: He is alert and oriented to person, place, and time.  Psychiatric:        Mood  and Affect: Mood normal.        Behavior: Behavior normal.        Thought Content: Thought content normal.      Assessment & Plan:  1. Cellulitis of right upper extremity -Is improving.  Continue with antibiotic therapy until finished.  Follow-up if not resolved  2. Finger infection -Soak finger in mixture of Betadine and water for 5 to 10 minutes twice daily over the next week.  Take antibiotics.  If no improvement in the next 2 days then follow-up.  No signs of foreign body noted  [name], NP

## 2021-03-22 ENCOUNTER — Ambulatory Visit (INDEPENDENT_AMBULATORY_CARE_PROVIDER_SITE_OTHER): Payer: Medicare Other

## 2021-03-22 ENCOUNTER — Ambulatory Visit: Payer: Medicare Other | Admitting: Family Medicine

## 2021-03-22 ENCOUNTER — Encounter: Payer: Self-pay | Admitting: Family Medicine

## 2021-03-22 ENCOUNTER — Other Ambulatory Visit: Payer: Self-pay

## 2021-03-22 VITALS — BP 140/82 | HR 76 | Ht 71.0 in | Wt 255.0 lb

## 2021-03-22 DIAGNOSIS — G8929 Other chronic pain: Secondary | ICD-10-CM | POA: Diagnosis not present

## 2021-03-22 DIAGNOSIS — M25521 Pain in right elbow: Secondary | ICD-10-CM | POA: Diagnosis not present

## 2021-03-22 DIAGNOSIS — M79641 Pain in right hand: Secondary | ICD-10-CM | POA: Diagnosis not present

## 2021-03-22 DIAGNOSIS — M5416 Radiculopathy, lumbar region: Secondary | ICD-10-CM

## 2021-03-22 DIAGNOSIS — L03113 Cellulitis of right upper limb: Secondary | ICD-10-CM | POA: Diagnosis not present

## 2021-03-22 DIAGNOSIS — M19041 Primary osteoarthritis, right hand: Secondary | ICD-10-CM | POA: Diagnosis not present

## 2021-03-22 DIAGNOSIS — M19021 Primary osteoarthritis, right elbow: Secondary | ICD-10-CM | POA: Diagnosis not present

## 2021-03-22 DIAGNOSIS — L0390 Cellulitis, unspecified: Secondary | ICD-10-CM | POA: Insufficient documentation

## 2021-03-22 MED ORDER — DOXYCYCLINE HYCLATE 100 MG PO TABS: 100.0000 mg | ORAL_TABLET | Freq: Two times a day (BID) | ORAL | 0 refills | Status: DC

## 2021-03-22 NOTE — Patient Instructions (Addendum)
Good to see you Sending in doxy 2 times a day for 10 days just to be safe sent to [facility] Any increase in redness seek medical attention Right hand and elbow xray Continue everything for the back See me again in 2 months

## 2021-03-22 NOTE — Assessment & Plan Note (Signed)
Continues to have some difficulty overall.  Discussed the possibility again of advanced imaging and possible injections.  Patient states live with the pain going intermittently he would not want to do this on a regular basis.  We will continue to conservative therapy.  Discussed the possibility of formal physical therapy again.  Discussed icing regimen and home exercises.  Discussed avoiding certain activities.  Follow-up with me again in 2 months

## 2021-03-22 NOTE — Assessment & Plan Note (Signed)
Patient had what appeared to be more of a cellulitis.Patient does describe that it was tracking initially and patient being on a blood thinner we will continue with the doxycycline for that another total of an additional 10 days.  Patient did have a splinter in the finger which makes it concerning for more of a fascial irritation.  Patient is improving but I do think it would be better to elongate the use of this medication.  Patient has had no side effects.  Follow-up with me again for his other ailments but can follow-up with his primary care for the cellulitis.  Patient will follow up with me again 2 months

## 2021-04-05 ENCOUNTER — Other Ambulatory Visit: Payer: Self-pay

## 2021-04-06 ENCOUNTER — Ambulatory Visit (INDEPENDENT_AMBULATORY_CARE_PROVIDER_SITE_OTHER): Payer: Medicare Other | Admitting: Adult Health

## 2021-04-06 VITALS — BP 116/70 | HR 83 | Temp 98.6°F | Ht 71.0 in | Wt 252.0 lb

## 2021-04-06 DIAGNOSIS — R1032 Left lower quadrant pain: Secondary | ICD-10-CM | POA: Diagnosis not present

## 2021-04-06 DIAGNOSIS — Z905 Acquired absence of kidney: Secondary | ICD-10-CM

## 2021-04-06 DIAGNOSIS — E559 Vitamin D deficiency, unspecified: Secondary | ICD-10-CM

## 2021-04-06 NOTE — Progress Notes (Signed)
Subjective:    Patient ID: Charles [name], male    DOB: 1941/02/26, 80 y.o.   MRN: [id number]  HPI  80 year old male who  has a past medical history of Anemia, Arthritis, AVM (arteriovenous malformation) of colon, Cancer of kidney ([place]) (2000), Coronary artery disease, Dysrhythmia, Eczema, GERD (gastroesophageal reflux disease), Hernia, ventral, History of blood transfusion, Hyperlipidemia, Hypertension, Myocardial infarction ([place]), Neuropathy, Obesity, Personal history of adenomatous colonic polyps (07/20/2012), Pneumonia (1942; 1950s X 1), Type II diabetes mellitus ([place]), and Walking pneumonia (2000's X 1).  He presents to the office today for right groin pain. He has a history of hernia repair x 5. Reports that he started to develop pain in his right groin over the last week and pain has been becoming a little more intense and starting to effect how much he can walk. Pain is only apparent with certain positions and walking. Marland Kitchen He denies trauma or aggravating injury. He has been stretching more. Has had some intermittent right sided low back " catching" over the last week as well.   Would like to check his kidney function ( due to single kidney) today and his Vitamin D levels.    Review of Systems See HPI   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[place]) 2000   left nephrectomy   Coronary artery disease    Dysrhythmia    PAF   Eczema    GERD (gastroesophageal reflux disease)    Hernia, ventral    History of blood transfusion    Hyperlipidemia    Hypertension    Myocardial infarction ([place])    Neuropathy    Obesity    Personal history of adenomatous colonic polyps 07/20/2012   3 + adenomas 2009 07/20/2012 2 diminutive polyps     Pneumonia 1942; 1950s X 1   Type II diabetes mellitus ([place])    type II   Walking pneumonia 2000's X  1    Social History   Socioeconomic History   Marital status: Widowed    Spouse name: Not on file   Number of children: 3   Years of education: Not on file   Highest education level: Not on file  Occupational History   Occupation: retired  Tobacco Use   Smoking status: Former    Years: 20.00    Types: Cigarettes, Pipe, Cigars    Quit date: 09/09/1968    Years since quitting: 52.6   Smokeless tobacco: Never   Tobacco comments:    smoking cigars- quit in 2006  , quit pipe 2000  Vaping Use   Vaping Use: Never used  Substance and Sexual Activity   Alcohol use: Yes    Alcohol/week: 2.0 standard drinks    Types: 2 Glasses of wine per week    Comment: 2 glasses on Tuesday   Drug use: No   Sexual activity: Never  Other Topics Concern   Not on file  Social History Narrative   Regular exercise: goes to the [facility] and walks   Caffeine use: daily; coffee   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     widowed    Three children, One in [place], one in [place] one in [place] - 2 : youngest daughter lives with  him   Social Determinants of Radio broadcast assistant Strain: Low Risk    Difficulty of Paying Living Expenses: Not hard at all  Food Insecurity: No Food Insecurity   Worried About Charity fundraiser in the Last Year: Never true   Arboriculturist in the Last Year: Never true  Transportation Needs: No Transportation Needs   Lack of Transportation (Medical): No   Lack of Transportation (Non-Medical): No  Physical Activity: Sufficiently Active   Days of Exercise per Week: 5 days   Minutes of Exercise per Session: 120 min  Stress: No Stress Concern Present   Feeling of Stress : Not at all  Social Connections: Moderately Isolated   Frequency of Communication with Friends and Family: More than three times a week   Frequency of Social Gatherings with Friends and Family: More than three times a week   Attends Religious Services: More than 4 times per year    Active Member of [employer] or Organizations: No   Attends Archivist Meetings: Never   Marital Status: Widowed  Intimate Partner Violence: Not At Risk   Fear of Current or Ex-Partner: No   Emotionally Abused: No   Physically Abused: No   Sexually Abused: No    Past Surgical History:  Procedure Laterality Date   CARDIAC CATHETERIZATION N/A 10/19/2015   Procedure: Left Heart Cath and Coronary Angiography;  Surgeon: Charolette Forward, MD;  Location: [facility];  Service: Cardiovascular;  Laterality: N/A;   CARDIAC CATHETERIZATION N/A 10/19/2015   Procedure: Coronary Stent Intervention;  Surgeon: Charolette Forward, MD;  Location: [facility];  Service: Cardiovascular;  Laterality: N/A;   CATARACT EXTRACTION, BILATERAL Bilateral 01/2018   Dr. Kathrin Penner did surgery and he developed MD and is seeing specialist now for MD treatment   COLONOSCOPY     "nothing showed up this time"   COLONOSCOPY W/ BIOPSIES AND POLYPECTOMY  X 1   CORONARY ANGIOPLASTY     EXCISION MASS NECK Left 01/08/2021   Procedure: EXCISION MASS NECK;  Surgeon: Melida Quitter, MD;  Location: [facility];  Service: ENT;  Laterality: Left;   FEMUR IM NAIL Right 07/26/2013   Procedure: INTRAMEDULLARY (IM) NAIL FEMORAL subtrochanteric;  Surgeon: Mauri Pole, MD;  Location: [facility];  Service: Orthopedics;  Laterality: Right;   FRACTURE SURGERY     INGUINAL HERNIA REPAIR Left >3 times   INGUINAL HERNIA REPAIR Right 2012   NEPHRECTOMY Left 2000   TONSILLECTOMY  1940s    Family History  Problem Relation Age of Onset   Drug abuse Other    Cancer Other    Heart disease Other    Lung disease Other    Diabetes Mother    Diabetes Sister    Colon cancer Neg Hx    Stomach cancer Neg Hx     Allergies  Allergen Reactions   Bee Venom Anaphylaxis    Current Outpatient Medications on File Prior to Visit  Medication Sig Dispense Refill   aspirin 81 MG chewable tablet Chew 81 mg by mouth in the morning.     atorvastatin  (LIPITOR) 40 MG tablet Take 1 tablet (40 mg total) by mouth daily at 6 PM. 30 tablet 3   Cholecalciferol (VITAMIN D-3) 125 MCG (5000 UT) TABS Take 5,000 Units by mouth in the morning.     Cyanocobalamin (VITAMIN B-12) 5000 MCG SUBL Place 5,000 mcg under the tongue every 3 (three) days.     doxycycline (VIBRA-TABS)  100 MG tablet Take 1 tablet (100 mg total) by mouth 2 (two) times daily. 20 tablet 0   doxycycline (VIBRAMYCIN) 100 MG capsule Take 1 capsule (100 mg total) by mouth 2 (two) times daily. 20 capsule 0   ELIQUIS 5 MG TABS tablet Take 5 mg by mouth 2 (two) times daily.     EPINEPHrine (EPI-PEN) 0.3 mg/0.3 mL DEVI Inject 0.3 mg into the muscle daily as needed (allergic reaction).     gabapentin (NEURONTIN) 100 MG capsule TAKE 1 CAPSULE BY MOUTH AT  BEDTIME (Patient taking differently: Take 100 mg by mouth at bedtime.) 90 capsule 3   glipiZIDE (GLUCOTROL) 5 MG tablet TAKE 1 TABLET BY MOUTH 2  TIMES DAILY BEFORE A MEAL (Patient taking differently: Take 5 mg by mouth 2 (two) times daily before a meal.) 180 tablet 3   Insulin Syringes, Disposable, U-100 0.5 ML MISC Use 1x a day 100 each 3   lisinopril (ZESTRIL) 20 MG tablet Take 20 mg by mouth in the morning.     metFORMIN (GLUCOPHAGE) 1000 MG tablet TAKE 1 TABLET BY MOUTH  TWICE DAILY WITH MEALS (Patient taking differently: Take 1,000 mg by mouth in the morning and at bedtime.) 180 tablet 3   metoprolol succinate (TOPROL-XL) 100 MG 24 hr tablet Take 50-100 mg by mouth See admin instructions. Take 1 tablet (100 mg) gy mouth in the morning & take 0.5 tablet (50 mg) by mouth in the evening.     Multiple Vitamins-Minerals (PRESERVISION AREDS 2 PO) Take 1 tablet by mouth in the morning and at bedtime.     nitroGLYCERIN (NITROSTAT) 0.4 MG SL tablet Place 1 tablet (0.4 mg total) under the tongue every 5 (five) minutes as needed for chest pain. 25 tablet 1   ONETOUCH DELICA LANCETS FINE MISC Use to check sugar daily 100 each 5   ONETOUCH VERIO test strip  USE 1 STRIP TO CHECK GLUCOSE ONCE DAILY 100 each 3   spironolactone (ALDACTONE) 25 MG tablet Take 25 mg by mouth in the morning.     tamsulosin (FLOMAX) 0.4 MG CAPS capsule TAKE 1 CAPSULE BY MOUTH  DAILY (Patient taking differently: Take 0.4 mg by mouth in the morning.) 90 capsule 3   tamsulosin (FLOMAX) 0.4 MG CAPS capsule Take 0.4 mg by mouth.     No current facility-administered medications on file prior to visit.    BP 116/70 (BP Location: Left Arm, Patient Position: Sitting, Cuff Size: Normal)   Pulse 83   Temp 98.6 F (37 C) (Oral)   Ht '5\' 11"'$  (1.803 m)   Wt 252 lb (114.3 kg)   SpO2 97%   BMI 35.15 kg/m       Objective:   Physical Exam Vitals and nursing note reviewed.  Constitutional:      Appearance: Normal appearance.  Cardiovascular:     Rate and Rhythm: Normal rate and regular rhythm.     Pulses: Normal pulses.     Heart sounds: Normal heart sounds.  Pulmonary:     Effort: Pulmonary effort is normal.     Breath sounds: Normal breath sounds.  Abdominal:     General: Abdomen is flat.     Palpations: Abdomen is soft.     Hernia: No hernia is present.  Musculoskeletal:        General: Normal range of motion.     Comments: No discomfort in hip or groin with straight leg raise, knee-to-chest, internal rotation but did have some mild discomfort in his  groin with external rotation.  Skin:    General: Skin is warm and dry.  Neurological:     General: No focal deficit present.     Mental Status: He is alert and oriented to person, place, and time.  Psychiatric:        Mood and Affect: Mood normal.        Behavior: Behavior normal.        Thought Content: Thought content normal.        Judgment: Judgment normal.      Assessment & Plan:   1. Left groin pain -Appears  to be more muscular.  No hernia felt.  Will check x-ray to make sure no skeletal issues with the right hip. - DG Hip Unilat W OR W/O Pelvis 2-3 Views Right; Future  2. Vitamin D deficiency  -  Vitamin D, 25-hydroxy; Future - Vitamin D, 25-hydroxy  3. Single kidney  - Basic metabolic panel; Future - Vitamin D, 25-hydroxy; Future - Urinalysis; Future - Basic metabolic panel - Urinalysis  [name], NP

## 2021-04-07 LAB — URINALYSIS
Bilirubin Urine: NEGATIVE
Glucose, UA: NEGATIVE
Hgb urine dipstick: NEGATIVE
Ketones, ur: NEGATIVE
Leukocytes,Ua: NEGATIVE
Nitrite: NEGATIVE
Specific Gravity, Urine: 1.02 (ref 1.001–1.035)
pH: 5.5 (ref 5.0–8.0)

## 2021-04-07 LAB — BASIC METABOLIC PANEL
BUN: 17 mg/dL (ref 7–25)
CO2: 23 mmol/L (ref 20–32)
Calcium: 9.1 mg/dL (ref 8.6–10.3)
Chloride: 104 mmol/L (ref 98–110)
Creat: 1.13 mg/dL (ref 0.70–1.22)
Glucose, Bld: 127 mg/dL — ABNORMAL HIGH (ref 65–99)
Potassium: 4.6 mmol/L (ref 3.5–5.3)
Sodium: 141 mmol/L (ref 135–146)

## 2021-04-07 LAB — EXTRA URINE SPECIMEN

## 2021-04-07 LAB — VITAMIN D 25 HYDROXY (VIT D DEFICIENCY, FRACTURES): Vit D, 25-Hydroxy: 45 ng/mL (ref 30–100)

## 2021-04-09 ENCOUNTER — Other Ambulatory Visit: Payer: Self-pay

## 2021-04-09 ENCOUNTER — Ambulatory Visit (INDEPENDENT_AMBULATORY_CARE_PROVIDER_SITE_OTHER)
Admission: RE | Admit: 2021-04-09 | Discharge: 2021-04-09 | Disposition: A | Discharge status: Home / Self Care | Payer: Medicare Other | Source: Ambulatory Visit | Attending: Adult Health | Admitting: Adult Health

## 2021-04-09 DIAGNOSIS — R1032 Left lower quadrant pain: Secondary | ICD-10-CM | POA: Diagnosis not present

## 2021-04-09 DIAGNOSIS — M47816 Spondylosis without myelopathy or radiculopathy, lumbar region: Secondary | ICD-10-CM | POA: Diagnosis not present

## 2021-04-09 DIAGNOSIS — M25551 Pain in right hip: Secondary | ICD-10-CM | POA: Diagnosis not present

## 2021-04-18 DIAGNOSIS — H34811 Central retinal vein occlusion, right eye, with macular edema: Secondary | ICD-10-CM | POA: Diagnosis not present

## 2021-04-18 DIAGNOSIS — H353133 Nonexudative age-related macular degeneration, bilateral, advanced atrophic without subfoveal involvement: Secondary | ICD-10-CM | POA: Diagnosis not present

## 2021-04-30 NOTE — Progress Notes (Signed)
Patient ID: Charles [name], male   DOB: Sep 09, 1941, 80 y.o.   MRN: [id number]  This visit occurred during the SARS-CoV-2 public health emergency.  Safety protocols were in place, including screening questions prior to the visit, additional usage of staff PPE, and extensive cleaning of exam room while observing appropriate contact time as indicated for disinfecting solutions.   HPI: Charles [name] is a 80 y.o.-year-old male, returning for f/u for DM2 dx 2008, uncontrolled, with complications (CAD - s/p NSTEMI, PN, mild CKD). Last visit 4 months ago.  Interim history: Since last visit, he had parotid gland resection last month.  This was benign ([place] tumor).  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 No increased urination, blurry vision, nausea, chest pain. He had ABx in 02/2021 for a rash.  DM2: Reviewed HbA1c levels: Lab Results  Component Value Date   HGBA1C 7.3 (A) 12/19/2020   HGBA1C 7.9 (A) 08/21/2020   HGBA1C 7.3 (A) 05/03/2020  12/2013: 8.6%  Pt is on a regimen of: - Metformin 1000 mg 2x a day >> 2000 mg with dinner (but sugars worse) >> 1000 mg 2x a day - Glipizide 10 mg 2x a day before meals  He did not start Ozempic 0.25 mg weekly -08/2020 >> too expensive. In the past, I recommended  NPH 14 units at bedtime, but he did not start. We tried Rybelsus 3 >> 6 mg daily-added 12/2018 >> $$$ >> stopped 01/2019 He checked with his insurance and none of the SGLT2 inhibitors are covered for him.  Pt checks his sugars once a day: - am: 112-160s, few 200s (pizza) >> 114, 122-218 >> 88-124 >> 104-181, 194 (pizza) - 2h after b'fast: 68x1 >> n/c >> 201, 206 >> n/c - before lunch:  n/c >> 69, 79 >> n/c >> 90 x1 >> n/c - 2h after lunch: 123-176 >> n/c - before dinner  n/c >> 160s >> n/c >> 97 - 2h after dinner: n/c >> 240 >> n/c - bedtime: 110-150 >> 160s >> n/c Lowest sugar was  71 >> 88 >> 97; he has hypoglycemia awareness in  the 80s. Highest sugar was 218 >> 120s >> 194.  -+ Mild CKD, last BUN/creatinine:  Lab Results  Component Value Date   BUN 17 04/06/2021   CREATININE 1.13 04/06/2021  Of note, he has a history of nephrectomy. On lisinopril 10.  -+ HL; last set of lipids: Lab Results  Component Value Date   CHOL 121 10/06/2020   HDL 46.20 10/06/2020   LDLCALC 56 10/06/2020   LDLDIRECT 139.8 07/08/2014   TRIG 92.0 10/06/2020   CHOLHDL 3 10/06/2020  On Lipitor 40.  - last eye exam was in 2022: No DR; now seeing Dr. [place] and Dr. Iona Hansen- retinal vein occlusion, + cataracts, + macular "hole".  He has a history of eye surgeries.  He gets intraocular injections  -She is numbness and tingling in feet improved after starting B12  B12 deficiency: -Diagnosed during investigation for numbness and tingling (peripheral neuropathy) -He was initially on B12 injections, then on 5000 mcg daily -He was on 5000 mcg every 3 days, changed by PCP 09/2019.  At last visit, I advised him that he can take 1000 mcg every day.  Reviewed his B12 levels: Lab Results  Component Value Date   VITAMINB12 900 10/06/2020   VITAMINB12 748 12/22/2019   VITAMINB12 1,033 (H) 10/08/2019   VITAMINB12 1,172 (H) 08/19/2018   VITAMINB12 >1500 (  H) 05/04/2018   VITAMINB12 1,249 (H) 02/18/2017   VITAMINB12 234 10/22/2016   VITAMINB12 217 04/19/2016   VITAMINB12 94 (L) 10/13/2015   He was admitted with NSTEMI 10/18/2015.  He had stents placed then.  He was going to the gym 5 out of 7 days, but stopped during the coronavirus pandemic.  He then restarted to go to the gym.  On tamsulosin.  ROS: + See HPI Constitutional: no weight gain/no weight loss, no fatigue, no subjective hyperthermia, no subjective hypothermia Eyes: no blurry vision, no xerophthalmia ENT: no sore throat, no nodules palpated in neck, no dysphagia, no odynophagia, no hoarseness Cardiovascular: no CP/no SOB/no palpitations/no leg swelling Respiratory: no cough/no  SOB/no wheezing Gastrointestinal: no N/no V/no D/no C/no acid reflux Musculoskeletal: no muscle aches/no joint aches Skin: no rashes, no hair loss Neurological: no tremors/no numbness/no tingling/no dizziness  I reviewed pt's medications, allergies, PMH, social hx, family hx, and changes were documented in the history of present illness. Otherwise, unchanged from my initial visit note.  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[place]) 2000   left nephrectomy   Coronary artery disease    Dysrhythmia    PAF   Eczema    GERD (gastroesophageal reflux disease)    Hernia, ventral    History of blood transfusion    Hyperlipidemia    Hypertension    Myocardial infarction ([place])    Neuropathy    Obesity    Personal history of adenomatous colonic polyps 07/20/2012   3 + adenomas 2009 07/20/2012 2 diminutive polyps     Pneumonia 1942; 1950s X 1   Type II diabetes mellitus ([place])    type II   Walking pneumonia 2000's X 1   Past Surgical History:  Procedure Laterality Date   CARDIAC CATHETERIZATION N/A 10/19/2015   Procedure: Left Heart Cath and Coronary Angiography;  Surgeon: Charolette Forward, MD;  Location: [facility];  Service: Cardiovascular;  Laterality: N/A;   CARDIAC CATHETERIZATION N/A 10/19/2015   Procedure: Coronary Stent Intervention;  Surgeon: Charolette Forward, MD;  Location: [facility];  Service: Cardiovascular;  Laterality: N/A;   CATARACT EXTRACTION, BILATERAL Bilateral 01/2018   Dr. Kathrin Penner did surgery and he developed MD and is seeing specialist now for MD treatment   COLONOSCOPY     "nothing showed up this time"   COLONOSCOPY W/ BIOPSIES AND POLYPECTOMY  X 1   CORONARY ANGIOPLASTY     EXCISION MASS NECK Left 01/08/2021   Procedure: EXCISION MASS NECK;  Surgeon: Melida Quitter, MD;  Location: [facility];  Service:  ENT;  Laterality: Left;   FEMUR IM NAIL Right 07/26/2013   Procedure: INTRAMEDULLARY (IM) NAIL FEMORAL subtrochanteric;  Surgeon: Mauri Pole, MD;  Location: [facility];  Service: Orthopedics;  Laterality: Right;   FRACTURE SURGERY     INGUINAL HERNIA REPAIR Left >3 times   INGUINAL HERNIA REPAIR Right 2012   NEPHRECTOMY Left 2000   TONSILLECTOMY  1940s   Social History   Socioeconomic History   Marital status: Widowed    Spouse name: Not on file   Number of children: 3   Years of education: Not on file   Highest education level: Not on file  Occupational History   Occupation: retired  Tobacco Use  Smoking status: Former    Years: 20.00    Types: Cigarettes, Pipe, Cigars    Quit date: 09/09/1968    Years since quitting: 52.6   Smokeless tobacco: Never   Tobacco comments:    smoking cigars- quit in 2006  , quit pipe 2000  Vaping Use   Vaping Use: Never used  Substance and Sexual Activity   Alcohol use: Yes    Alcohol/week: 2.0 standard drinks    Types: 2 Glasses of wine per week    Comment: 2 glasses on Tuesday   Drug use: No   Sexual activity: Never  Other Topics Concern   Not on file  Social History Narrative   Regular exercise: goes to the [facility] and walks   Caffeine use: daily; coffee   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     widowed    Three children, One in [place], one in [place] one in [place] - 2 : youngest daughter lives with him   Social Determinants of Health   Financial Resource Strain: Low Risk    Difficulty of Paying Living Expenses: Not hard at all  Food Insecurity: No Food Insecurity   Worried About Charity fundraiser in the Last Year: Never true   Arboriculturist in the Last Year: Never true  Transportation Needs: No Transportation Needs   Lack of Transportation (Medical): No   Lack of Transportation (Non-Medical): No  Physical Activity: Sufficiently Active   Days of Exercise per Week: 5 days   Minutes of Exercise per  Session: 120 min  Stress: No Stress Concern Present   Feeling of Stress : Not at all  Social Connections: Moderately Isolated   Frequency of Communication with Friends and Family: More than three times a week   Frequency of Social Gatherings with Friends and Family: More than three times a week   Attends Religious Services: More than 4 times per year   Active Member of [employer] or Organizations: No   Attends Archivist Meetings: Never   Marital Status: Widowed  Human resources officer Violence: Not At Risk   Fear of Current or Ex-Partner: No   Emotionally Abused: No   Physically Abused: No   Sexually Abused: No   Current Outpatient Medications on File Prior to Visit  Medication Sig Dispense Refill   aspirin 81 MG chewable tablet Chew 81 mg by mouth in the morning.     atorvastatin (LIPITOR) 40 MG tablet Take 1 tablet (40 mg total) by mouth daily at 6 PM. 30 tablet 3   Cholecalciferol (VITAMIN D-3) 125 MCG (5000 UT) TABS Take 5,000 Units by mouth in the morning.     Cyanocobalamin (VITAMIN B-12) 5000 MCG SUBL Place 5,000 mcg under the tongue every 3 (three) days.     doxycycline (VIBRA-TABS) 100 MG tablet Take 1 tablet (100 mg total) by mouth 2 (two) times daily. 20 tablet 0   doxycycline (VIBRAMYCIN) 100 MG capsule Take 1 capsule (100 mg total) by mouth 2 (two) times daily. 20 capsule 0   ELIQUIS 5 MG TABS tablet Take 5 mg by mouth 2 (two) times daily.     EPINEPHrine (EPI-PEN) 0.3 mg/0.3 mL DEVI Inject 0.3 mg into the muscle daily as needed (allergic reaction).     gabapentin (NEURONTIN) 100 MG capsule TAKE 1 CAPSULE BY MOUTH AT  BEDTIME (Patient taking differently: Take 100 mg by mouth at bedtime.) 90 capsule 3   glipiZIDE (GLUCOTROL) 5 MG tablet  TAKE 1 TABLET BY MOUTH 2  TIMES DAILY BEFORE A MEAL (Patient taking differently: Take 5 mg by mouth 2 (two) times daily before a meal.) 180 tablet 3   Insulin Syringes, Disposable, U-100 0.5 ML MISC Use 1x a day 100 each 3   lisinopril  (ZESTRIL) 20 MG tablet Take 20 mg by mouth in the morning.     metFORMIN (GLUCOPHAGE) 1000 MG tablet TAKE 1 TABLET BY MOUTH  TWICE DAILY WITH MEALS (Patient taking differently: Take 1,000 mg by mouth in the morning and at bedtime.) 180 tablet 3   metoprolol succinate (TOPROL-XL) 100 MG 24 hr tablet Take 50-100 mg by mouth See admin instructions. Take 1 tablet (100 mg) gy mouth in the morning & take 0.5 tablet (50 mg) by mouth in the evening.     Multiple Vitamins-Minerals (PRESERVISION AREDS 2 PO) Take 1 tablet by mouth in the morning and at bedtime.     nitroGLYCERIN (NITROSTAT) 0.4 MG SL tablet Place 1 tablet (0.4 mg total) under the tongue every 5 (five) minutes as needed for chest pain. 25 tablet 1   ONETOUCH DELICA LANCETS FINE MISC Use to check sugar daily 100 each 5   ONETOUCH VERIO test strip USE 1 STRIP TO CHECK GLUCOSE ONCE DAILY 100 each 3   spironolactone (ALDACTONE) 25 MG tablet Take 25 mg by mouth in the morning.     tamsulosin (FLOMAX) 0.4 MG CAPS capsule TAKE 1 CAPSULE BY MOUTH  DAILY (Patient taking differently: Take 0.4 mg by mouth in the morning.) 90 capsule 3   tamsulosin (FLOMAX) 0.4 MG CAPS capsule Take 0.4 mg by mouth.     No current facility-administered medications on file prior to visit.   Allergies  Allergen Reactions   Bee Venom Anaphylaxis   Family History  Problem Relation Age of Onset   Drug abuse Other    Cancer Other    Heart disease Other    Lung disease Other    Diabetes Mother    Diabetes Sister    Colon cancer Neg Hx    Stomach cancer Neg Hx     PE: BP 122/88 (BP Location: Right Arm, Patient Position: Sitting, Cuff Size: Normal)   Pulse 74   Ht '5\' 11"'$  (1.803 m)   Wt 255 lb 12.8 oz (116 kg)   SpO2 97%   BMI 35.68 kg/m  Wt Readings from Last 3 Encounters:  05/01/21 255 lb 12.8 oz (116 kg)  04/06/21 252 lb (114.3 kg)  03/22/21 255 lb (115.7 kg)   Constitutional: overweight, in NAD Eyes: PERRLA, EOMI, no exophthalmos ENT: moist mucous  membranes, no thyromegaly, no cervical lymphadenopathy Cardiovascular: RRR, No MRG, +R>L LE swelling Respiratory: CTA B Gastrointestinal: abdomen soft, NT, ND, BS+ Musculoskeletal: no deformities, strength intact in all 4 Skin: moist, warm, no rashes Neurological: no tremor with outstretched hands, DTR normal in all 4  ASSESSMENT: 1. DM2, insulin-dependent but not on insulin, uncontrolled, with complications - CAD - s/p NSTEMI (Dr. Terrence Dupont) - peripheral neuropathy - CKD  2. B12 def  3.  Peripheral neuropathy  4. HL  PLAN:  1. Patient with longstanding, uncontrolled, type 2 diabetes, with improved control earlier in 2021, but worsened control this year.  At that time, he relaxed his diet around the time of the winter holidays and also starting gabapentin, which he felt was increasing his CBGs.  We tried to obtain a GLP-1 receptor agonist for him but this was not affordable despite a referral to [facility].  He did start to improve his diet before last visit and started back on exercise.  His sugars improved and HbA1c was also better, at 7.3%.  We did not change his regimen at last visit but he was only checking sugars in the morning and I advised him to also check later in the day. -At today's visit, sugars are mostly at goal in the morning but with occasional spikes when he had dietary indiscretions the night before (pizza).  Therefore, at this visit, especially since he does not have lows, we can continue his current regimen. -He did notice that his sugars were better when he was at the beach, possibly because he was more active. - I advised him to: Patient Instructions  Please continue: - Metformin 1000 mg 2x a day with meals - Glipizide 10 mg 2x a day before meals  Please check some sugars later in the day, also.  Please return in 3-4 months with your sugar log.  - we checked his HbA1c: 6.9% (better) - advised to check sugars at different times of the day - 1x a day, rotating  check times - advised for yearly eye exams >> he is UTD - return to clinic in 3-4 months    2.  And 3. Peripheral neuropathy and B12 deficiency -His peripheral neuropathy is probably related to both diabetes and B12 deficiency -At last visit he was on 5000 mcg every 3 days and I advised him to switch to 1000 mcg daily after he ran out of the high-dose tablet. -Vitamin B12 level was at goal in 09/2020   4. HL -Reviewed latest panel from 09/2020: All fractions at goal: Lab Results  Component Value Date   CHOL 121 10/06/2020   HDL 46.20 10/06/2020   LDLCALC 56 10/06/2020   LDLDIRECT 139.8 07/08/2014   TRIG 92.0 10/06/2020   CHOLHDL 3 10/06/2020  -Continues Lipitor 40 mg daily without side effects   Philemon Kingdom, MD PhD [facility] Endocrinology

## 2021-05-01 ENCOUNTER — Other Ambulatory Visit: Payer: Self-pay

## 2021-05-01 ENCOUNTER — Ambulatory Visit (INDEPENDENT_AMBULATORY_CARE_PROVIDER_SITE_OTHER): Payer: Medicare Other | Admitting: Internal Medicine

## 2021-05-01 ENCOUNTER — Encounter: Payer: Self-pay | Admitting: Internal Medicine

## 2021-05-01 VITALS — BP 122/88 | HR 74 | Ht 71.0 in | Wt 255.8 lb

## 2021-05-01 DIAGNOSIS — E1159 Type 2 diabetes mellitus with other circulatory complications: Secondary | ICD-10-CM

## 2021-05-01 DIAGNOSIS — E1165 Type 2 diabetes mellitus with hyperglycemia: Secondary | ICD-10-CM

## 2021-05-01 DIAGNOSIS — E538 Deficiency of other specified B group vitamins: Secondary | ICD-10-CM

## 2021-05-01 DIAGNOSIS — E785 Hyperlipidemia, unspecified: Secondary | ICD-10-CM

## 2021-05-01 LAB — POCT GLYCOSYLATED HEMOGLOBIN (HGB A1C): Hemoglobin A1C: 6.9 % — AB (ref 4.0–5.6)

## 2021-05-01 NOTE — Patient Instructions (Addendum)
Please continue: - Metformin 1000 mg 2x a day with meals - Glipizide 10 mg 2x a day before meals  Please check more sugars later in the day, also.  Please return in 3-4 months with your sugar log.

## 2021-05-01 NOTE — Addendum Note (Signed)
Addended by: Lauralyn Primes on: 05/01/2021 11:47 AM   Modules accepted: Orders

## 2021-05-03 ENCOUNTER — Other Ambulatory Visit: Payer: Self-pay | Admitting: Internal Medicine

## 2021-05-07 ENCOUNTER — Ambulatory Visit (INDEPENDENT_AMBULATORY_CARE_PROVIDER_SITE_OTHER): Payer: Medicare Other

## 2021-05-07 DIAGNOSIS — E785 Hyperlipidemia, unspecified: Secondary | ICD-10-CM

## 2021-05-07 DIAGNOSIS — E1159 Type 2 diabetes mellitus with other circulatory complications: Secondary | ICD-10-CM | POA: Diagnosis not present

## 2021-05-07 DIAGNOSIS — I10 Essential (primary) hypertension: Secondary | ICD-10-CM

## 2021-05-07 DIAGNOSIS — I4891 Unspecified atrial fibrillation: Secondary | ICD-10-CM

## 2021-05-07 DIAGNOSIS — E1165 Type 2 diabetes mellitus with hyperglycemia: Secondary | ICD-10-CM

## 2021-05-07 NOTE — Patient Instructions (Signed)
Visit Information  PATIENT GOALS:  Goals Addressed             This Visit's Progress    RNCM: Better self manage and track my diabetes -Diabetes Type 2   On track    Timeframe:  Long-Range Goal Priority:  Medium Start Date:    10/18/20                       Expected End Date:        07/09/21            Follow Up Date by 06/18/21    - check blood sugar at prescribed times try checking 1 1/2-2 hours after eating -try checking blood sugar after eating at different times -recheck blood sugar if you get an unexpected number - check blood sugar before and after exercise - check blood sugar if I feel it is too high or too low - enter blood sugar readings and medication or insulin into daily log - take the blood sugar log to all doctor visits - take the blood sugar meter to all doctor visits   -keep glucose tablets or some other fast acting sugar on hand to treat low blood sugars Why is this important?   Checking your blood sugar at home helps to keep it from getting very high or very low.  Writing the results in a diary or log helps the doctor know how to care for you.  Your blood sugar log should have the time, date and the results.  Also, write down the amount of insulin or other medicine that you take.  Other information, like what you ate, exercise done and how you were feeling, will also be helpful.     Notes:      RNCM: Develop and follow self management plan for Atrial Fibrillation   On track    Timeframe:  Long-Range Goal Priority:  High Start Date:      10/18/20                       Expected End Date:      07/09/21                 Follow Up Date 06/18/21    - check pulse (heart) rate once a day - cut down alcohol use - make a plan to exercise regularly - make a plan to eat healthy - take medicine as prescribed  -review atrial fibrillation action plan -take anticoagulant to prevent stroke -take log of B/P and weights to cardiology visit Why is this important?    Atrial fibrillation may have no symptoms. Sometimes the symptoms get worse or happen more often.  It is important to keep track of what your symptoms are and when they happen.  A change in symptoms is important to discuss with your doctor or nurse.  Being active and healthy eating will also help you manage your heart condition.     Notes:         Patient verbalizes understanding of instructions provided today and agrees to view in [facility].   Telephone follow up appointment with care management team member scheduled for: 06/18/21 at 38 AM  Peter Garter RN, [facility], CDE Care Management Coordinator [facility] [phone], Mobile [phone]

## 2021-05-07 NOTE — Chronic Care Management (AMB) (Signed)
Chronic Care Management   CCM RN Visit Note  05/07/2021 Name: Charles [name] MRN: [id number] DOB: 07/29/1941  Subjective: Charles [name] is a 80 y.o. year old male who is a primary care patient of [name], NP. The care management team was consulted for assistance with disease management and care coordination needs.    Engaged with patient by telephone for follow up visit in response to provider referral for case management and/or care coordination services.   Consent to Services:  The patient was given information about Chronic Care Management services, agreed to services, and gave verbal consent prior to initiation of services.  Please see initial visit note for detailed documentation.   Patient agreed to services and verbal consent obtained.   Assessment: Review of patient past medical history, allergies, medications, health status, including review of consultants reports, laboratory and other test data, was performed as part of comprehensive evaluation and provision of chronic care management services.   SDOH (Social Determinants of Health) assessments and interventions performed:    CCM Care Plan  Allergies  Allergen Reactions   Bee Venom Anaphylaxis    Outpatient Encounter Medications as of 05/07/2021  Medication Sig   aspirin 81 MG chewable tablet Chew 81 mg by mouth in the morning.   atorvastatin (LIPITOR) 40 MG tablet Take 1 tablet (40 mg total) by mouth daily at 6 PM.   Cholecalciferol (VITAMIN D-3) 125 MCG (5000 UT) TABS Take 5,000 Units by mouth in the morning.   Cyanocobalamin (VITAMIN B-12) 5000 MCG SUBL Place 5,000 mcg under the tongue every 3 (three) days.   doxycycline (VIBRA-TABS) 100 MG tablet Take 1 tablet (100 mg total) by mouth 2 (two) times daily.   doxycycline (VIBRAMYCIN) 100 MG capsule Take 1 capsule (100 mg total) by mouth 2 (two) times daily.   ELIQUIS 5 MG TABS tablet Take 5 mg by mouth 2 (two) times daily.   EPINEPHrine (EPI-PEN) 0.3 mg/0.3 mL DEVI  Inject 0.3 mg into the muscle daily as needed (allergic reaction).   gabapentin (NEURONTIN) 100 MG capsule TAKE 1 CAPSULE BY MOUTH AT  BEDTIME (Patient taking differently: Take 100 mg by mouth at bedtime.)   glipiZIDE (GLUCOTROL) 5 MG tablet TAKE 1 TABLET BY MOUTH 2  TIMES DAILY BEFORE A MEAL (Patient taking differently: Take 5 mg by mouth 2 (two) times daily before a meal.)   Insulin Syringes, Disposable, U-100 0.5 ML MISC Use 1x a day   lisinopril (ZESTRIL) 20 MG tablet Take 20 mg by mouth in the morning.   metFORMIN (GLUCOPHAGE) 1000 MG tablet TAKE 1 TABLET BY MOUTH  TWICE DAILY WITH MEALS   metoprolol succinate (TOPROL-XL) 100 MG 24 hr tablet Take 50-100 mg by mouth See admin instructions. Take 1 tablet (100 mg) gy mouth in the morning & take 0.5 tablet (50 mg) by mouth in the evening.   Multiple Vitamins-Minerals (PRESERVISION AREDS 2 PO) Take 1 tablet by mouth in the morning and at bedtime.   nitroGLYCERIN (NITROSTAT) 0.4 MG SL tablet Place 1 tablet (0.4 mg total) under the tongue every 5 (five) minutes as needed for chest pain.   ONETOUCH DELICA LANCETS FINE MISC Use to check sugar daily   ONETOUCH VERIO test strip USE 1 STRIP TO CHECK GLUCOSE ONCE DAILY   spironolactone (ALDACTONE) 25 MG tablet Take 25 mg by mouth in the morning.   tamsulosin (FLOMAX) 0.4 MG CAPS capsule TAKE 1 CAPSULE BY MOUTH  DAILY (Patient taking differently: Take 0.4 mg by mouth in  the morning.)   tamsulosin (FLOMAX) 0.4 MG CAPS capsule Take 0.4 mg by mouth.   No facility-administered encounter medications on file as of 05/07/2021.    Patient Active Problem List   Diagnosis Date Noted   Cellulitis 03/22/2021   Neck mass 01/08/2021   Atrial fibrillation ([place]) 10/25/2020   Parotid mass 10/25/2020   Epistaxis 09/29/2018   Hyperkalemia 09/29/2018   AKI (acute kidney injury) ([place]) 09/29/2018   Lumbar radiculopathy 08/06/2017   Greater trochanteric bursitis of left hip 07/04/2017   Rotator cuff arthropathy of left  shoulder 01/24/2017   AC (acromioclavicular) arthritis 01/24/2017   Vitamin B12 deficiency 01/18/2016   NSTEMI (non-ST elevated myocardial infarction) ([place]) 10/19/2015   Acute non Q wave MI (myocardial infarction), initial episode of care ([place]) 10/19/2015   Peripheral neuropathy 10/13/2015   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[place]) 07/14/2015   Actinic keratosis of right cheek 10/04/2014   Contact dermatitis and eczema 02/10/2014   Secondary renovascular hypertension, 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   Acute blood loss anemia 08/27/2013   Insomnia 08/27/2013   Hip fracture, right ([place]) 07/25/2013   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   Renal cell carcinoma ([place]) 07/25/2013   History of colonic polyps 07/20/2012   Hyperlipidemia 11/13/2007   Obesity 11/13/2007   Essential hypertension 04/23/2007    Conditions to be addressed/monitored:Atrial Fibrillation, HTN, HLD, and DMII  Care Plan : RNCM:Atrial Fibrillation (Adult)  Updates made by Dimitri Ped, RN since 05/07/2021 12:00 AM     Problem: Lack of self mangement plan for Atrial Fibrillation   Priority: High     Long-Range Goal: RNCM: Develolp and self manage atrial fibrillation   Start Date: 10/18/2020  Expected End Date: 07/09/2021  This Visit's Progress: On track  Recent Progress: On track  Priority: High  Note:   Current Barriers:  Knowledge Deficits related to new dx of atrial fibrillation with hx of HTN and HLD -Reports no atrial fibrillation symptoms. Denies any shortness of breath,chest pain or swelling.  Reports taking Eliquis as directed.  States he is to see  Dr. Terrence Dupont on 05/21/21.  Denies any swelling and his weight has been stable.  States his B/P usually good when he checks [facility] at the [facility].    Chronic Disease Management support and education needs related to Atrial fibrillation Nurse Case Manager Clinical Goal(s):  Over the next 120 days, patient will verbalize understanding of plan for self  management of atrial fibrillation Over the next 120 days, patient will verbalize basic understanding of atrial fibrillation disease process and self health management plan as evidenced by decrease in symptoms, adherence to medications, keeping provider appts  Interventions:  1:1 collaboration with [name], NP regarding development and update of comprehensive plan of care as evidenced by provider attestation and co-signature Inter-disciplinary care team collaboration (see longitudinal plan of care)  Reinforced patient to call provider for worsening symptoms of atrial fibrillation  Reinforced education to patient re: atrial fibrillation disease process and atrial fibrillation action plan  Reviewed medications with patient and discussed importance of taking medications as ordered to lower heart rate and taking anticoagulant Reviewed scheduled/upcoming provider appointments including: Dr. Terrence Dupont cardiology 05/21/21, Dr. Cruzita Lederer 08/20/21 Discussed plans with patient for ongoing care management follow up and provided patient with direct contact information for care management team Reinforced to keep a log of his weights and B/P to take to his appt with Dr.Harwani Patient Goals/Self-Care Activities Patient will attend all scheduled provider appointments Patient will call provider office  for new concerns or questions - check pulse (heart) rate once a day - cut down alcohol use - make a plan to exercise regularly - make a plan to eat healthy - take medicine as prescribed -review atrial fibrillation action plan -take anticoagulant to prevent stroke -take log of B/P and weights to cardiology visit Follow Up Plan: Telephone follow up appointment with care management team member scheduled for: 06/18/21 at 9 AM The patient has been provided with contact information for the care management team and has been advised to call with any health related questions or concerns.          Care Plan :  RNCM:Diabetes Type 2 (Adult)  Updates made by Dimitri Ped, RN since 05/07/2021 12:00 AM     Problem: RNCM: Lack of effective self management of Diabetes Type 2   Priority: Medium     Long-Range Goal: Effective self management of blood sugars and Diabetes Type 2   Start Date: 10/18/2020  Expected End Date: 07/09/2021  This Visit's Progress: On track  Recent Progress: On track  Priority: Medium  Note:   Objective:  Lab Results  Component Value Date   HGBA1C 7.9 (A) 08/21/2020   Lab Results  Component Value Date   CREATININE 1.06 10/06/2020   CREATININE 1.02 10/08/2019   CREATININE 1.21 10/26/2018  Current Barriers:  Knowledge Deficits related to basic Diabetes pathophysiology and self care/management with hx of HTN and atrial fib. States he saw Dr. Cruzita Lederer on 05/01/21.  States his hemoglobin A1C was down to 6.9%.  States his fasting readings range from 109-130.  States he was checking his after meal readings a few times but has not checked much recently. Denies any hypoglycemia.   Does not adhere to provider recommendations re: Low CHO diet and portion control. Reports watching portions better and avoiding second helpings.  States he is eating more vegetables in the summer Reports going to gym 5 days a week walking 1-2 miles and working on IT trainer Case Manager Clinical Goal(s):  patient will demonstrate improved adherence to prescribed treatment plan for diabetes self care/management as evidenced by: daily monitoring and recording of CBG  adherence to ADA/ carb modified diet exercise 3 days/week adherence to prescribed medication regimen Interventions:  Collaboration with [name], NP regarding development and update of comprehensive plan of care as evidenced by provider attestation and co-signature Inter-disciplinary care team collaboration (see longitudinal plan of care) Reinforced education to patient about basic DM disease process Reviewed medications  with patient and discussed importance of medication adherence Discussed plans with patient for ongoing care management follow up and provided patient with direct contact information for care management team Reviewed scheduled/upcoming provider appointments including: Dr. Terrence Dupont cardiology 05/21/21, Dr. Cruzita Lederer endocrinologist 08/20/21, Dr. Tamala Julian sports med 05/23/21 Reinforced  to check cbg daily at varying times and record, calling provider for findings outside established parameters.   Reinforced importance of getting regular exercise with resistance training at least 2 times a week Reinforced the rule of 15 for hypoglycemia and discussed sources of fast acting sugar to have on hand Patient Goals/Self-Care Activities Over the next 90 days, patient will:  - Self administers oral medications as prescribed Attends all scheduled provider appointments Checks blood sugars as prescribed and utilize hyper and hypoglycemia protocol as needed Adheres to prescribed ADA/carb modified - check blood sugar at prescribed times try checking 1 1/2-2 hours after eating -try checking blood sugar after eating at different times -recheck blood sugar if you get  an unexpected number - check blood sugar before and after exercise - check blood sugar if I feel it is too high or too low - enter blood sugar readings and medication or insulin into daily log - take the blood sugar log to all doctor visits - take the blood sugar meter to all doctor visits  -keep glucose tablets or some other fast acting sugar on hand to treat low blood sugars Follow Up Plan: Telephone follow up appointment with care management team member scheduled for: 06/18/21 at 9 AM The patient has been provided with contact information for the care management team and has been advised to call with any health related questions or concerns.       Plan:Telephone follow up appointment with care management team member scheduled for:  06/18/21 and The  patient has been provided with contact information for the care management team and has been advised to call with any health related questions or concerns.  Peter Garter RN, Jackquline Denmark, CDE Care Management Coordinator [facility] [phone], Mobile [phone]

## 2021-05-09 DIAGNOSIS — L918 Other hypertrophic disorders of the skin: Secondary | ICD-10-CM | POA: Diagnosis not present

## 2021-05-09 DIAGNOSIS — D3617 Benign neoplasm of peripheral nerves and autonomic nervous system of trunk, unspecified: Secondary | ICD-10-CM | POA: Diagnosis not present

## 2021-05-09 DIAGNOSIS — L570 Actinic keratosis: Secondary | ICD-10-CM | POA: Diagnosis not present

## 2021-05-09 DIAGNOSIS — D692 Other nonthrombocytopenic purpura: Secondary | ICD-10-CM | POA: Diagnosis not present

## 2021-05-09 DIAGNOSIS — L821 Other seborrheic keratosis: Secondary | ICD-10-CM | POA: Diagnosis not present

## 2021-05-17 ENCOUNTER — Telehealth: Payer: Self-pay | Admitting: Pharmacist

## 2021-05-17 NOTE — Chronic Care Management (AMB) (Signed)
    Chronic Care Management Pharmacy Assistant   Name: KINGSON WELLES  MRN: [id number] DOB: 26-Aug-1941  05/18/2021 APPOINTMENT REMINDER   Called [name], No answer, left message of appointment on 05/18/21 at 8:30am via telephone visit with Jeni Salles, Pharm D. Notified to have all medications, supplements, blood pressure and/or blood sugar logs available during appointment and to return call if need to reschedule.  Care Gaps:  AWV - Scheduled for 12/26/2021 Foot Exam  - overdue since 08/23/2018 Ophthalmology Exam - overdue since 01/02/2021 Flu vaccine - due  Star Rating Drug:  Atorvastatin '40mg'$  - last filled 04/19/2021 90DS at [facility] Glipizide '5mg'$  - last filled 04/27/2021 90DS at [facility] Lisinopril '20mg'$  - last filled 04/27/2021 90DS at [facility] Metformin '1000mg'$  - last filled 03/09/2021 90DS at [facility]   Any gaps in medications fill history? [place]  Clinical Pharmacist Assistant [phone]

## 2021-05-18 ENCOUNTER — Ambulatory Visit (INDEPENDENT_AMBULATORY_CARE_PROVIDER_SITE_OTHER): Payer: Medicare Other | Admitting: Pharmacist

## 2021-05-18 DIAGNOSIS — I10 Essential (primary) hypertension: Secondary | ICD-10-CM

## 2021-05-18 DIAGNOSIS — E1159 Type 2 diabetes mellitus with other circulatory complications: Secondary | ICD-10-CM

## 2021-05-18 NOTE — Progress Notes (Signed)
Chronic Care Management Pharmacy Note  05/18/2021 Name:  Charles [name] MRN:  [id number] DOB:  1941/08/04  Summary: A1c at goal of < 7% BP at goal < 130/80 per home readings   Recommendations/Changes made from today's visit: -Recommended continuing with vitamin D supplementation based on previous level but consider taking every other day -Recommended checking BG prior to bedtime and aiming for 100-150   Plan: DM assessment in 3 months  Subjective: Charles [name] is an 80 y.o. year old male who is a primary patient of Charles Peng, NP.  The CCM team was consulted for assistance with disease management and care coordination needs.    Engaged with patient by telephone for follow up visit in response to provider referral for pharmacy case management and/or care coordination services.   Consent to Services:  The patient was given information about Chronic Care Management services, agreed to services, and [name] verbal consent prior to initiation of services.  Please see initial visit note for detailed documentation.   Patient Care Team: Charles Peng, NP as PCP - General (Family Medicine) Charles Quitter, MD as Consulting Physician (Otolaryngology) Charles Forward, MD as Consulting Physician (Cardiology) Charles Kingdom, MD as Consulting Physician (Internal Medicine) Viona Gilmore, [facility] as Pharmacist (Pharmacist) Dimitri Ped, RN as Case Manager  Recent office visits: 05/07/21 Charles Gave, RN: Patient presented for CCM visit.  04/06/21 Charles Peng, NP: Patient presented for left groin pain. No changes made.  03/20/21 Charles Peng, NP: Patient presented for cellulitis follow up. Symptoms are improving.  03/15/21 Charles Gave, RN: Patient presented for CCM visit.  03/14/21 Charles Peng, NP: Patient presented for cellulitis. Prescribed doxycycline BID x 10 days.  01/25/21 Charles [name] CCM RN (Family Medicine) - seen for type 2 diabetes, A-FIB and hypertension. No  medication changes and follow up on 03/15/21.   12/20/20 Charles Rakes, LPN: Patient presented for AWV.  Recent consult visits: 05/01/21 Charles Kingdom, MD (endo): Patient presented for DM follow up. A1c decreased to 6.9%.  03/22/21 Charles Saas, DO (Sports Medicine): Patient presented for elbow mass and hip pain follow up. Prescribed doxycycline BID x 10 days.  01/06/21 Charles Saas, DO (Sports Medicine): Patient presented for parotid mass and hip pain follow up.   12/20/20 Charles Kingdom, MD (endo): Patient presented for DM follow up. A1c improved at 7.3%. Recommended for him to switch vit B12 to 1000 mcg daily to replaced 5000 mcg every 3 days. Recommended to reach out post surgery if having low BGs.  Hospital visits: 5/2-01/09/21 Patient admitted for neck mass excision.  Objective:  Lab Results  Component Value Date   CREATININE 1.13 04/06/2021   BUN 17 04/06/2021   GFR 65.23 10/25/2020   GFRNONAA >60 01/08/2021   GFRAA 53 (L) 09/29/2018   NA 141 04/06/2021   K 4.6 04/06/2021   CALCIUM 9.1 04/06/2021   CO2 23 04/06/2021   GLUCOSE 127 (H) 04/06/2021    Lab Results  Component Value Date/Time   HGBA1C 6.9 (A) 05/01/2021 11:47 AM   HGBA1C 7.3 (A) 12/19/2020 11:20 AM   HGBA1C 7.9 (H) 09/29/2018 02:23 AM   HGBA1C 6.9 (H) 08/08/2016 08:56 AM   GFR 65.23 10/25/2020 10:19 AM   GFR 66.74 10/06/2020 09:40 AM   MICROALBUR 13.8 (H) 08/08/2016 08:56 AM   MICROALBUR 11.4 (H) 07/21/2015 08:37 AM    Last diabetic Eye exam:  Lab Results  Component Value Date/Time   HMDIABEYEEXA No Retinopathy 11/12/2017 12:00 AM    Last diabetic Foot  exam:  Lab Results  Component Value Date/Time   HMDIABFOOTEX yes 11/24/2009 12:00 AM     Lab Results  Component Value Date   CHOL 121 10/06/2020   HDL 46.20 10/06/2020   LDLCALC 56 10/06/2020   LDLDIRECT 139.8 07/08/2014   TRIG 92.0 10/06/2020   CHOLHDL 3 10/06/2020    Hepatic Function Latest Ref Rng & Units 10/25/2020 10/06/2020 10/08/2019   Total Protein 6.0 - 8.3 g/dL 6.5 6.6 6.9  Albumin 3.5 - 5.2 g/dL 4.1 4.1 4.3  AST 0 - 37 U/L _0 ALT 0 - 53 U/L _1 Alk Phosphatase 39 - 117 U/L 75 72 77  Total Bilirubin 0.2 - 1.2 mg/dL 0.6 0.6 0.6  Bilirubin, Direct 0.0 - 0.3 mg/dL - - -    Lab Results  Component Value Date/Time   TSH 2.94 10/06/2020 09:40 AM   TSH 2.55 10/08/2019 09:49 AM    CBC Latest Ref Rng & Units 01/08/2021 10/25/2020 10/06/2020  WBC 4.0 - 10.5 K/uL 4.7 4.6 4.6  Hemoglobin 13.0 - 17.0 g/dL 13.4 12.1(L) 12.8(L)  Hematocrit 39.0 - 52.0 % 41.6 37.7(L) 39.1  Platelets 150 - 400 K/uL 211 208.0 207.0    Lab Results  Component Value Date/Time   VD25OH 45 04/06/2021 03:50 PM   VD25OH 19.55 (L) 10/06/2020 09:40 AM    Clinical ASCVD: Yes  The ASCVD Risk score (Arnett DK, et al., 2019) failed to calculate for the following reasons:   The 2019 ASCVD risk score is only valid for ages 12 to 40   The patient has a prior MI or stroke diagnosis    Depression screen [facility] 2/9 12/20/2020 10/18/2020 10/06/2020  Decreased Interest 0 0 0  Down, Depressed, Hopeless 0 - 0  PHQ - 2 Score 0 0 0  Some recent data might be hidden     CHA2DS2/VAS Stroke Risk Points  Current as of 2 minutes ago     5 >= 2 Points: High Risk  1 - 1.99 Points: Medium Risk  0 Points: Low Risk    No Change      Details    This score determines the patient's risk of having a stroke if the  patient has atrial fibrillation.       Points Metrics  0 Has Congestive Heart Failure:  No    Current as of 2 minutes ago  1 Has Vascular Disease:  Yes     Current as of 2 minutes ago  1 Has Hypertension:  Yes    Current as of 2 minutes ago  2 Age:  31    Current as of 2 minutes ago  1 Has Diabetes:  Yes    Current as of 2 minutes ago  0 Had Stroke:  No  Had TIA:  No  Had Thromboembolism:  No    Current as of 2 minutes ago  0 Male:  No    Current as of 2 minutes ago     Social History   Tobacco Use  Smoking Status Former   Years:  20.00   Types: Cigarettes, Pipe, Cigars   Quit date: 09/09/1968   Years since quitting: 52.7  Smokeless Tobacco Never  Tobacco Comments   smoking cigars- quit in 2006  , quit pipe 2000   BP Readings from Last 3 Encounters:  05/01/21 122/88  04/06/21 116/70  03/22/21 140/82   Pulse Readings from Last 3 Encounters:  05/01/21 74  04/06/21 83  03/22/21 76  Wt Readings from Last 3 Encounters:  05/01/21 255 lb 12.8 oz (116 kg)  04/06/21 252 lb (114.3 kg)  03/22/21 255 lb (115.7 kg)   BMI Readings from Last 3 Encounters:  05/01/21 35.68 kg/m  04/06/21 35.15 kg/m  03/22/21 35.57 kg/m    Assessment/Interventions: Review of patient past medical history, allergies, medications, health status, including review of consultants reports, laboratory and other test data, was performed as part of comprehensive evaluation and provision of chronic care management services.   SDOH:  (Social Determinants of Health) assessments and interventions performed: No  SDOH Screenings   Alcohol Screen: Low Risk    Last Alcohol Screening Score (AUDIT): 2  Depression (PHQ2-9): Low Risk    PHQ-2 Score: 0  Financial Resource Strain: Low Risk    Difficulty of Paying Living Expenses: Not hard at all  Food Insecurity: No Food Insecurity   Worried About Charity fundraiser in the Last Year: Never true   Ran Out of Food in the Last Year: Never true  Housing: Low Risk    Last Housing Risk Score: 0  Physical Activity: Sufficiently Active   Days of Exercise per Week: 5 days   Minutes of Exercise per Session: 120 min  Social Connections: Moderately Isolated   Frequency of Communication with Friends and Family: More than three times a week   Frequency of Social Gatherings with Friends and Family: More than three times a week   Attends Religious Services: More than 4 times per year   Active Member of [employer] or Organizations: No   Attends Archivist Meetings: Never   Marital Status: Widowed   Stress: No Stress Concern Present   Feeling of Stress : Not at all  Tobacco Use: Medium Risk   Smoking Tobacco Use: Former   Smokeless Tobacco Use: Never  Transportation Needs: No Data processing manager (Medical): No   Lack of Transportation (Non-Medical): No    CCM Care Plan  Allergies  Allergen Reactions   Bee Venom Anaphylaxis    Medications Reviewed Today     Reviewed by Viona Gilmore, [facility] (Pharmacist) on 05/18/21 at 0855  Med List Status: <None>   Medication Order Taking? Sig Documenting Provider Last Dose Status Informant  aspirin 81 MG chewable tablet [id number] No Chew 81 mg by mouth in the morning. [provider] Taking Active Self  atorvastatin (LIPITOR) 40 MG tablet [id number] No Take 1 tablet (40 mg total) by mouth daily at 6 PM. Dixie Dials, MD Taking Active Self  Cholecalciferol (VITAMIN D-3 PO) [id number] No Take 5,000 Units by mouth in the morning. [provider] Taking Active Self  Cyanocobalamin (VITAMIN B-12) 5000 MCG SUBL [id number] No Place 5,000 mcg under the tongue every 3 (three) days. [provider] Taking Active Self  ELIQUIS 5 MG TABS tablet [id number] No Take 5 mg by mouth 2 (two) times daily. [provider] Taking Active Self  EPINEPHrine (EPI-PEN) 0.3 mg/0.3 mL DEVI [id number] No Inject 0.3 mg into the muscle daily as needed (allergic reaction). [provider] Taking Active Self  gabapentin (NEURONTIN) 100 MG capsule [id number] No TAKE 1 CAPSULE BY MOUTH AT  BEDTIME  Patient taking differently: Take 100 mg by mouth at bedtime.   Lyndal Pulley, DO Taking Active   glipiZIDE (GLUCOTROL) 5 MG tablet [id number] No TAKE 1 TABLET BY MOUTH 2  TIMES DAILY BEFORE A MEAL  Patient taking differently: Take 5 mg by mouth 2 (two) times daily before  a meal.   Charles Kingdom, MD Taking Active   Insulin Syringes, Disposable, U-100 0.5 ML MISC [id number] No Use 1x a day Charles Kingdom, MD  Taking Active Self  lisinopril (ZESTRIL) 20 MG tablet [id number] No Take 20 mg by mouth in the morning. [provider] Taking Active Self  metFORMIN (GLUCOPHAGE) 1000 MG tablet [id number]  TAKE 1 TABLET BY MOUTH  TWICE DAILY WITH MEALS Charles Kingdom, MD  Active   metoprolol succinate (TOPROL-XL) 100 MG 24 hr tablet [id number] No Take 50-100 mg by mouth See admin instructions. Take 1 tablet (100 mg) gy mouth in the morning & take 0.5 tablet (50 mg) by mouth in the evening. [provider] Taking Active Self           Med Note Kenton Kingfisher, Germaine Pomfret Dec 27, 2020  8:38 AM)    Multiple Vitamins-Minerals (PRESERVISION AREDS 2 PO) [id number] No Take 1 tablet by mouth in the morning and at bedtime. [provider] Taking Active Self  nitroGLYCERIN (NITROSTAT) 0.4 MG SL tablet [id number] No Place 1 tablet (0.4 mg total) under the tongue every 5 (five) minutes as needed for chest pain. Dixie Dials, MD Taking Active Self  Jonetta Speak LANCETS FINE [place] [id number] No Use to check sugar daily Charles Kingdom, MD Taking Active Self  Roma Schanz test strip [id number] No USE 1 STRIP TO CHECK GLUCOSE ONCE DAILY Charles Kingdom, MD Taking Active Self  spironolactone (ALDACTONE) 25 MG tablet [id number] No Take 25 mg by mouth in the morning. [provider] Taking Active Self  tamsulosin (FLOMAX) 0.4 MG CAPS capsule [id number] No TAKE 1 CAPSULE BY MOUTH  DAILY  Patient taking differently: Take 0.4 mg by mouth in the morning.   Charles Peng, NP Taking Active   Discontinued 05/18/21 [id number] (Completed Course)           Patient Active Problem List   Diagnosis Date Noted   Cellulitis 03/22/2021   Neck mass 01/08/2021   Atrial fibrillation ([place]) 10/25/2020   Parotid mass 10/25/2020   Epistaxis 09/29/2018   Hyperkalemia 09/29/2018   AKI (acute kidney injury) ([place]) 09/29/2018   Lumbar radiculopathy 08/06/2017   Greater trochanteric bursitis of left hip 07/04/2017    Rotator cuff arthropathy of left shoulder 01/24/2017   AC (acromioclavicular) arthritis 01/24/2017   Vitamin B12 deficiency 01/18/2016   NSTEMI (non-ST elevated myocardial infarction) ([place]) 10/19/2015   Acute non Q wave MI (myocardial infarction), initial episode of care ([place]) 10/19/2015   Peripheral neuropathy 10/13/2015   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[place]) 07/14/2015   Actinic keratosis of right cheek 10/04/2014   Contact dermatitis and eczema 02/10/2014   Secondary renovascular hypertension, 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   Acute blood loss anemia 08/27/2013   Insomnia 08/27/2013   Hip fracture, right ([place]) 07/25/2013   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   Renal cell carcinoma ([place]) 07/25/2013   History of colonic polyps 07/20/2012   Hyperlipidemia 11/13/2007   Obesity 11/13/2007   Essential hypertension 04/23/2007    Immunization History  Administered Date(s) Administered   Fluad Quad(high Dose 65+) 07/05/2019   Influenza Split 07/18/2011, 07/06/2012, 07/07/2012   Influenza Whole 06/09/2006, 06/17/2008, 07/10/2009, 05/18/2010   Influenza, High Dose Seasonal PF 06/11/2016, 06/06/2017, 05/26/2018   Influenza,inj,Quad PF,6+ Mos 06/14/2013, 06/02/2014, 07/14/2015   Influenza-Unspecified 06/27/2020   PFIZER(Purple Top)SARS-COV-2 Vaccination 09/28/2019, 10/19/2019, 06/18/2020   Pneumococcal Conjugate-13 07/14/2014   Pneumococcal Polysaccharide-23 06/09/2006   Td 09/09/2001   Tdap 02/20/2012  Lab Results  Component Value Date   CHOL 121 10/06/2020   HDL 46.20 10/06/2020   LDLCALC 56 10/06/2020   LDLDIRECT 139.8 07/08/2014   TRIG 92.0 10/06/2020   CHOLHDL 3 10/06/2020    BP Readings from Last 3 Encounters:  05/01/21 122/88  04/06/21 116/70  03/22/21 140/82   Patient has been [facility]. Patient reports he always needs to make changes to his diet. He tends to eat less heavy hot foods in the summer.  Conditions to be  addressed/monitored:  Hypertension, Hyperlipidemia, Diabetes and Atrial Fibrillation  Conditions addressed this visit: Diabetes, hypertension   Care Plan : CCM Pharmacy Care Plan  Updates made by Viona Gilmore, [place] since 05/18/2021 12:00 AM     Problem: Problem: Hypertension, Hyperlipidemia, Diabetes and Atrial Fibrillation      Long-Range Goal: Patient-Specific Goal   Start Date: 01/12/2021  Expected End Date: 01/12/2022  Recent Progress: On track  Priority: High  Note:   Current Barriers:  Unable to independently monitor therapeutic efficacy Unable to achieve control of diabetes   Pharmacist Clinical Goal(s):  Patient will achieve control of diabetes as evidenced by A1c  through collaboration with PharmD and provider.   Interventions: 1:1 collaboration with Charles Peng, NP regarding development and update of comprehensive plan of care as evidenced by provider attestation and co-signature Inter-disciplinary care team collaboration (see longitudinal plan of care) Comprehensive medication review performed; medication list updated in electronic medical record  Hypertension (BP goal <140/90) -Controlled -Current treatment: Lisinopril 20 mg, 1 tablet once daily Metoprolol succinate (Toprol-XL) 131m, 1 tablet in the morning and 1/2 tablet Spironolactone 25 mg 1 tablet in the morning -Medications previously tried: n/a  -Current home readings: 120/70-80s ([facility] -Current dietary habits: cut down on "bad foods" -Current exercise habits: [facility] [employer] Mon[facility]Fri -Denies hypotensive/hypertensive symptoms -Educated on Daily salt intake goal < 2300 mg; Exercise goal of 150 minutes per week; Importance of home blood pressure monitoring; Proper BP monitoring technique; -Counseled to monitor BP at home weekly, document, and provide log at future appointments -Counseled on diet and exercise extensively Recommended to  continue current medication Recommended checking BP before exercise  Hyperlipidemia: (LDL goal < 70) -Controlled -Current treatment: Atorvastatin 40 mg 1 tablet daily -Medications previously tried: none  -Current dietary patterns: cut down on "bad foods" -[facility]Educ[facility] on Cholesterol goals;  Benefits of statin for ASCVD risk reduction; Importance of limiting foods high in cholesterol; -Counseled on diet and exercise extensively Recommended to continue current medication  Diabetes (A1c goal <7%) -Uncontrolled -Current medications: Glipizide 5 mg 1 tablet twice daily Metformin 1000 mg 1 tablet twice daily -Medications previously tried: Ozempic (cost), Rybelsus (cost)  -Current home glucose readings fasting glucose: 110-150s (nothing below 100) post prandial glucose: 205 (sometimes checking) -Denies hypoglycemic/hyperglycemic symptoms -Current meal patterns:  breakfast: did not discuss  lunch: did not discuss; goes out to eat lunch 2-3 times a week dinner: salads, sandwich; usually around 8 pm snacks: grape tomatoes, PB and crackers drinks: did not discuss -Current exercise: going to the [employer] Mon-Fri; swimming some -Educated on A1c and blood sugar goals; Exercise goal of 150 minutes per week; Benefits of routine self-monitoring of blood sugar; Carbohydrate counting and/or plate method -Counseled to check feet daily and get yearly eye exams -Counseled on diet and exercise extensively Recommended to continue current medication Counseled on rotating times of  day when checking blood sugars to give a better picture of the whole day instead of just fasting Checking before bedtime and aiming for 100-150.  Atrial Fibrillation (Goal: prevent stroke and major bleeding) -Controlled -CHADSVASC: 5 -Current treatment: Rate control: metoprolol succinate 100 mg 1 tablet in the morning and 1/2 tablet in the evening  Anticoagulation: Eliquis 5 mg  1 tablet twice daily  -Medications previously tried: none -Home BP and HR readings: does not monitor at home  -Counseled on importance of adherence to anticoagulant exactly as prescribed; bleeding risk associated with Eliquis and importance of self-monitoring for signs/symptoms of bleeding; avoidance of NSAIDs due to increased bleeding risk with anticoagulants; -Recommended to continue current medication  BPH (Goal: minimize symptoms of enlarged prostate) -Controlled -Current treatment  Tamsulosin 0.4 mg 1 capsule daily -Medications previously tried: none  -Recommended to continue current medication  History of NSTEMI (Goal: prevent future events) -Controlled -Current treatment  Aspirin 81 mg 1 tablet daily Nitroglycerin 0.4 mg SL tablet -Medications previously tried: none  -Recommended to continue current medication   Health Maintenance -Vaccine gaps: shingles -Current therapy:  Preservision Areds 2 1 capsule in the morning and at bedtime Vitamin B12 5000 mcg 1 SL tablet three days a week Vitamin D 2000 units daily -Educated on Cost vs benefit of each product must be carefully weighed by individual consumer -Patient is satisfied with current therapy and denies issues -Counseled on recommendations from Dr. Cruzita Lederer about taking vitamin B12 2500 mcg daily instead of 5000 mcg every other day.   Patient Goals/Self-Care Activities Patient will:  - take medications as prescribed check glucose daily, document, and provide at future appointments check blood pressure weekly, document, and provide at future appointments target a minimum of 150 minutes of moderate intensity exercise weekly  Follow Up Plan: Telephone follow up appointment with care management team member scheduled for: 6 months      Medication Assistance:  Patient does not qualify for any patient assistance programs due to income  Compliance/Adherence/Medication fill history: Care Gaps: Foot exam, eye exam,  influenza   Star-Rating Drugs: Atorvastatin 87m - last filled 04/19/2021 90DS at [facility] Glipizide 531m- last filled 04/27/2021 90DS at [facility] Lisinopril 2066m last filled 04/27/2021 90DS at [facility] Metformin 1000m20mlast filled 03/09/2021 [facility]  ([place]: [phone]: [phone]-[place] [address] [place] ([place]) [place] [phone]: [phone]: [phone] ([facility]) - [place]ne: [phone]: [phone] pill box? Yes Pt endorses 100% compliance  We discussed: Current pharmacy is preferred with insurance plan and patient is satisfied with pharmacy services Patient decided to: Continue current medication management strategy  Care Plan and Follow Up Patient Decision:  Patient agrees to Care Plan and Follow-up.  Plan: Telephone follow up appointment with care management team member scheduled for:  6 months  MadeJeni SallesarmD BCACBartlettrmacist [place]-[phone]

## 2021-05-18 NOTE — Patient Instructions (Addendum)
Hi Jim,  It was great to catch up! Below is a summary of some of the topics we discussed.   Please reach out to me if you have any questions or need anything before our follow up!  Best, [name]  Jeni Salles, PharmD, [facility] [place]   Visit Information   Goals Addressed   None    Patient Care Plan: RNCM:Atrial Fibrillation (Adult)     Problem Identified: Lack of self mangement plan for Atrial Fibrillation   Priority: High     Long-Range Goal: RNCM: Develolp and self manage atrial fibrillation   Start Date: 10/18/2020  Expected End Date: 07/09/2021  This Visit's Progress: On track  Recent Progress: On track  Priority: High  Note:   Current Barriers:  Knowledge Deficits related to new dx of atrial fibrillation with hx of HTN and HLD -Reports no atrial fibrillation symptoms. Denies any shortness of breath,chest pain or swelling.  Reports taking Eliquis as directed.  States he is to see  Dr. Terrence Dupont on 05/21/21.  Denies any swelling and his weight has been stable.  States his B/P usually good when he checks [facility] at the [facility].    Chronic Disease Management support and education needs related to Atrial fibrillation Nurse Case Manager Clinical Goal(s):  Over the next 120 days, patient will verbalize understanding of plan for self management of atrial fibrillation Over the next 120 days, patient will verbalize basic understanding of atrial fibrillation disease process and self health management plan as evidenced by decrease in symptoms, adherence to medications, keeping provider appts  Interventions:  1:1 collaboration with [name], NP regarding development and update of comprehensive plan of care as evidenced by provider attestation and co-signature Inter-disciplinary care team collaboration (see longitudinal plan of care)  Reinforced patient to call provider for worsening symptoms of atrial fibrillation  Reinforced  education to patient re: atrial fibrillation disease process and atrial fibrillation action plan  Reviewed medications with patient and discussed importance of taking medications as ordered to lower heart rate and taking anticoagulant Reviewed scheduled/upcoming provider appointments including: Dr. Terrence Dupont cardiology 05/21/21, Dr. Cruzita Lederer 08/20/21 Discussed plans with patient for ongoing care management follow up and provided patient with direct contact information for care management team Reinforced to keep a log of his weights and B/P to take to his appt with Dr.Harwani Patient Goals/Self-Care Activities Patient will attend all scheduled provider appointments Patient will call provider office for new concerns or questions - check pulse (heart) rate once a day - cut down alcohol use - make a plan to exercise regularly - make a plan to eat healthy - take medicine as prescribed -review atrial fibrillation action plan -take anticoagulant to prevent stroke -take log of B/P and weights to cardiology visit Follow Up Plan: Telephone follow up appointment with care management team member scheduled for: 06/18/21 at 9 AM The patient has been provided with contact information for the care management team and has been advised to call with any health related questions or concerns.          Patient Care Plan: RNCM:Diabetes Type 2 (Adult)     Problem Identified: RNCM: Lack of effective self management of Diabetes Type 2   Priority: Medium     Long-Range Goal: Effective self management of blood sugars and Diabetes Type 2   Start Date: 10/18/2020  Expected End Date: 07/09/2021  This Visit's Progress: On track  Recent Progress: On track  Priority: Medium  Note:  Objective:  Lab Results  Component Value Date   HGBA1C 7.9 (A) 08/21/2020   Lab Results  Component Value Date   CREATININE 1.06 10/06/2020   CREATININE 1.02 10/08/2019   CREATININE 1.21 10/26/2018  Current Barriers:  Knowledge  Deficits related to basic Diabetes pathophysiology and self care/management with hx of HTN and atrial fib. States he saw Dr. Cruzita Lederer on 05/01/21.  States his hemoglobin A1C was down to 6.9%.  States his fasting readings range from 109-130.  States he was checking his after meal readings a few times but has not checked much recently. Denies any hypoglycemia.   Does not adhere to provider recommendations re: Low CHO diet and portion control. Reports watching portions better and avoiding second helpings.  States he is eating more vegetables in the summer Reports going to gym 5 days a week walking 1-2 miles and working on IT trainer Case Manager Clinical Goal(s):  patient will demonstrate improved adherence to prescribed treatment plan for diabetes self care/management as evidenced by: daily monitoring and recording of CBG  adherence to ADA/ carb modified diet exercise 3 days/week adherence to prescribed medication regimen Interventions:  Collaboration with [name], NP regarding development and update of comprehensive plan of care as evidenced by provider attestation and co-signature Inter-disciplinary care team collaboration (see longitudinal plan of care) Reinforced education to patient about basic DM disease process Reviewed medications with patient and discussed importance of medication adherence Discussed plans with patient for ongoing care management follow up and provided patient with direct contact information for care management team Reviewed scheduled/upcoming provider appointments including: Dr. Terrence Dupont cardiology 05/21/21, Dr. Cruzita Lederer endocrinologist 08/20/21, Dr. Tamala Julian sports med 05/23/21 Reinforced  to check cbg daily at varying times and record, calling provider for findings outside established parameters.   Reinforced importance of getting regular exercise with resistance training at least 2 times a week Reinforced the rule of 15 for hypoglycemia and discussed sources of  fast acting sugar to have on hand Patient Goals/Self-Care Activities Over the next 90 days, patient will:  - Self administers oral medications as prescribed Attends all scheduled provider appointments Checks blood sugars as prescribed and utilize hyper and hypoglycemia protocol as needed Adheres to prescribed ADA/carb modified - check blood sugar at prescribed times try checking 1 1/2-2 hours after eating -try checking blood sugar after eating at different times -recheck blood sugar if you get an unexpected number - check blood sugar before and after exercise - check blood sugar if I feel it is too high or too low - enter blood sugar readings and medication or insulin into daily log - take the blood sugar log to all doctor visits - take the blood sugar meter to all doctor visits  -keep glucose tablets or some other fast acting sugar on hand to treat low blood sugars Follow Up Plan: Telephone follow up appointment with care management team member scheduled for: 06/18/21 at 9 AM The patient has been provided with contact information for the care management team and has been advised to call with any health related questions or concerns.      Patient Care Plan: CCM Pharmacy Care Plan     Problem Identified: Problem: Hypertension, Hyperlipidemia, Diabetes and Atrial Fibrillation      Long-Range Goal: Patient-Specific Goal   Start Date: 01/12/2021  Expected End Date: 01/12/2022  Recent Progress: On track  Priority: High  Note:   Current Barriers:  Unable to independently monitor therapeutic efficacy Unable to achieve control of diabetes  Pharmacist Clinical Goal(s):  Patient will achieve control of diabetes as evidenced by A1c  through collaboration with PharmD and provider.   Interventions: 1:1 collaboration with [name], NP regarding development and update of comprehensive plan of care as evidenced by provider attestation and co-signature Inter-disciplinary care team  collaboration (see longitudinal plan of care) Comprehensive medication review performed; medication list updated in electronic medical record  Hypertension (BP goal <140/90) -Controlled -Current treatment: Lisinopril 20 mg, 1 tablet once daily Metoprolol succinate (Toprol-XL) '100mg'$ , 1 tablet in the morning and 1/2 tablet Spironolactone 25 mg 1 tablet in the morning -Medications previously tried: n/a  -Current home readings: 120/70-80s (checking at the [facility] sometimes every day or sometimes once a day) -Current dietary habits: cut down on "bad foods" -Current exercise habits: going to the [employer] Mon-Fri -Denies hypotensive/hypertensive symptoms -Educated on Daily salt intake goal < 2300 mg; Exercise goal of 150 minutes per week; Importance of home blood pressure monitoring; Proper BP monitoring technique; -Counseled to monitor BP at home weekly, document, and provide log at future appointments -Counseled on diet and exercise extensively Recommended to continue current medication Recommended checking BP before exercise  Hyperlipidemia: (LDL goal < 70) -Controlled -Current treatment: Atorvastatin 40 mg 1 tablet daily -Medications previously tried: none  -Current dietary patterns: cut down on "bad foods" -Current exercise habits: exercising at the [facility] -Educated on Cholesterol goals;  Benefits of statin for ASCVD risk reduction; Importance of limiting foods high in cholesterol; -Counseled on diet and exercise extensively Recommended to continue current medication  Diabetes (A1c goal <7%) -Uncontrolled -Current medications: Glipizide 5 mg 1 tablet twice daily Metformin 1000 mg 1 tablet twice daily -Medications previously tried: Ozempic (cost), Rybelsus (cost)  -Current home glucose readings fasting glucose: 110-150s (nothing below 100) post prandial glucose: 205 (sometimes checking) -Denies hypoglycemic/hyperglycemic symptoms -Current meal patterns:  breakfast: did not discuss   lunch: did not discuss; goes out to eat lunch 2-3 times a week dinner: salads, sandwich; usually around 8 pm snacks: grape tomatoes, PB and crackers drinks: did not discuss -Current exercise: going to the [employer] Mon-Fri; swimming some -Educated on A1c and blood sugar goals; Exercise goal of 150 minutes per week; Benefits of routine self-monitoring of blood sugar; Carbohydrate counting and/or plate method -Counseled to check feet daily and get yearly eye exams -Counseled on diet and exercise extensively Recommended to continue current medication Counseled on rotating times of day when checking blood sugars to give a better picture of the whole day instead of just fasting Checking before bedtime and aiming for 100-150.  Atrial Fibrillation (Goal: prevent stroke and major bleeding) -Controlled -CHADSVASC: 5 -Current treatment: Rate control: metoprolol succinate 100 mg 1 tablet in the morning and 1/2 tablet in the evening  Anticoagulation: Eliquis 5 mg 1 tablet twice daily  -Medications previously tried: none -Home BP and HR readings: does not monitor at home  -Counseled on importance of adherence to anticoagulant exactly as prescribed; bleeding risk associated with Eliquis and importance of self-monitoring for signs/symptoms of bleeding; avoidance of NSAIDs due to increased bleeding risk with anticoagulants; -Recommended to continue current medication  BPH (Goal: minimize symptoms of enlarged prostate) -Controlled -Current treatment  Tamsulosin 0.4 mg 1 capsule daily -Medications previously tried: none  -Recommended to continue current medication  History of NSTEMI (Goal: prevent future events) -Controlled -Current treatment  Aspirin 81 mg 1 tablet daily Nitroglycerin 0.4 mg SL tablet -Medications previously tried: none  -Recommended to continue current medication   Health Maintenance -Vaccine gaps: shingles -  Current therapy:  Preservision Areds 2 1 capsule in the morning  and at bedtime Vitamin B12 5000 mcg 1 SL tablet three days a week Vitamin D 2000 units daily -Educated on Cost vs benefit of each product must be carefully weighed by individual consumer -Patient is satisfied with current therapy and denies issues -Counseled on recommendations from Dr. Cruzita Lederer about taking vitamin B12 2500 mcg daily instead of 5000 mcg every other day.   Patient Goals/Self-Care Activities Patient will:  - take medications as prescribed check glucose daily, document, and provide at future appointments check blood pressure weekly, document, and provide at future appointments target a minimum of 150 minutes of moderate intensity exercise weekly  Follow Up Plan: Telephone follow up appointment with care management team member scheduled for: 6 months       Patient verbalizes understanding of instructions provided today and agrees to view in [place].  Telephone follow up appointment with pharmacy team member scheduled for: 6 months  Viona Gilmore, [facility]

## 2021-05-21 DIAGNOSIS — I10 Essential (primary) hypertension: Secondary | ICD-10-CM | POA: Diagnosis not present

## 2021-05-21 DIAGNOSIS — I2510 Atherosclerotic heart disease of native coronary artery without angina pectoris: Secondary | ICD-10-CM | POA: Diagnosis not present

## 2021-05-21 DIAGNOSIS — E785 Hyperlipidemia, unspecified: Secondary | ICD-10-CM | POA: Diagnosis not present

## 2021-05-21 DIAGNOSIS — I480 Paroxysmal atrial fibrillation: Secondary | ICD-10-CM | POA: Diagnosis not present

## 2021-05-21 DIAGNOSIS — E559 Vitamin D deficiency, unspecified: Secondary | ICD-10-CM | POA: Diagnosis not present

## 2021-05-21 DIAGNOSIS — E119 Type 2 diabetes mellitus without complications: Secondary | ICD-10-CM | POA: Diagnosis not present

## 2021-05-22 NOTE — Progress Notes (Signed)
[place] Phone: [phone] Subjective:   Charles [name], am serving as a scribe for Dr. Hulan Saas. This visit occurred during the SARS-CoV-2 public health emergency.  Safety protocols were in place, including screening questions prior to the visit, additional usage of staff PPE, and extensive cleaning of exam room while observing appropriate contact time as indicated for disinfecting solutions.   I'm seeing this patient by the request  of:  [name], NP  CC: Finger pain, back pain follow-up  [id number]  03/22/2021 Continues to have some difficulty overall.  Discussed the possibility again of advanced imaging and possible injections.  Patient states live with the pain going intermittently he would not want to do this on a regular basis.  We will continue to conservative therapy.  Discussed the possibility of formal physical therapy again.  Discussed icing regimen and home exercises.  Discussed avoiding certain activities.  Follow-up with me again in 2 months  Patient had what appeared to be more of a cellulitis.Patient does describe that it was tracking initially and patient being on a blood thinner we will continue with the doxycycline for that another total of an additional 10 days.  Patient did have a splinter in the finger which makes it concerning for more of a fascial irritation.  Patient is improving but I do think it would be better to elongate the use of this medication.  Patient has had [name] side effects.  Follow-up with me again for his other ailments but can follow-up with his primary care for the cellulitis.  Patient will follow up with me again 2 months  Update 05/23/2021 Charles [name] is a 80 y.o. male coming in with complaint of R elbow, R hand and LBP. Patient states that one month ago he was having R groin pain. Saw PCP who recommended patient stretch which has helped alleviate his pain. Patients  back pain is unchanged but he feels he is managing his pain.   Antibiotics helped alleviate his elbow and hand pain.   Patient is having catching of R 2nd finger. Uses this finger to scroll.   Xray R elbow 03/22/2021 IMPRESSION: 1. Soft tissue swelling compatible with given history of cellulitis. 2. Mild osteoarthritis.  [name] acute bony abnormality.  Xray R hand 03/22/2021 IMPRESSION: 1. Mild multifocal osteoarthritis.  [name] acute bony abnormality.      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[place]) 2000   left nephrectomy   Coronary artery disease    Dysrhythmia    PAF   Eczema    GERD (gastroesophageal reflux disease)    Hernia, ventral    History of blood transfusion    Hyperlipidemia    Hypertension    Myocardial infarction ([place])    Neuropathy    Obesity    Personal history of adenomatous colonic polyps 07/20/2012   3 + adenomas 2009 07/20/2012 2 diminutive polyps     Pneumonia 1942; 1950s X 1   Type II diabetes mellitus ([place])    type II   Walking pneumonia 2000's X 1   Past Surgical History:  Procedure Laterality Date   CARDIAC CATHETERIZATION N/A 10/19/2015   Procedure: Left Heart Cath and Coronary Angiography;  Surgeon: Prudencio Burly  Terrence Dupont, MD;  Location: [facility];  Service: Cardiovascular;  Laterality: N/A;   CARDIAC CATHETERIZATION N/A 10/19/2015   Procedure: Coronary Stent Intervention;  Surgeon: Charolette Forward, MD;  Location: [facility];  Service: Cardiovascular;  Laterality: N/A;   CATARACT EXTRACTION, BILATERAL Bilateral 01/2018   Dr. Kathrin Penner did surgery and he developed MD and is seeing specialist now for MD treatment   COLONOSCOPY     "nothing showed up this time"   COLONOSCOPY W/ BIOPSIES AND POLYPECTOMY  X 1   CORONARY ANGIOPLASTY     EXCISION MASS NECK Left 01/08/2021   Procedure: EXCISION  MASS NECK;  Surgeon: Melida Quitter, MD;  Location: [facility];  Service: ENT;  Laterality: Left;   FEMUR IM NAIL Right 07/26/2013   Procedure: INTRAMEDULLARY (IM) NAIL FEMORAL subtrochanteric;  Surgeon: Mauri Pole, MD;  Location: [facility];  Service: Orthopedics;  Laterality: Right;   FRACTURE SURGERY     INGUINAL HERNIA REPAIR Left >3 times   INGUINAL HERNIA REPAIR Right 2012   NEPHRECTOMY Left 2000   TONSILLECTOMY  1940s   Social History   Socioeconomic History   Marital status: Widowed    Spouse name: Not on file   Number of children: 3   Years of education: Not on file   Highest education level: Not on file  Occupational History   Occupation: retired  Tobacco Use   Smoking status: Former    Years: 20.00    Types: Cigarettes, Pipe, Cigars    Quit date: 09/09/1968    Years since quitting: 52.7   Smokeless tobacco: Never   Tobacco comments:    smoking cigars- quit in 2006  , quit pipe 2000  Vaping Use   Vaping Use: Never used  Substance and Sexual Activity   Alcohol use: Yes    Alcohol/week: 2.0 standard drinks    Types: 2 Glasses of wine per week    Comment: 2 glasses on Tuesday   Drug use: [name]   Sexual activity: Never  Other Topics Concern   Not on file  Social History Narrative   Regular exercise: goes to the [facility] and walks   Caffeine use: daily; coffee   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     widowed    Three children, One in [place], one in [place] one in [place] - 2 : youngest daughter lives with him   Social Determinants of Health   Financial Resource Strain: Low Risk    Difficulty of Paying Living Expenses: Not hard at all  Food Insecurity: [name] Food Insecurity   Worried About Charity fundraiser in the Last Year: Never true   Arboriculturist in the Last Year: Never true  Transportation Needs: [name] Transportation Needs   Lack of Transportation (Medical): [name]   Lack of Transportation (Non-Medical): [name]  Physical Activity: Sufficiently Active    Days of Exercise per Week: 5 days   Minutes of Exercise per Session: 120 min  Stress: [name] Stress Concern Present   Feeling of Stress : Not at all  Social Connections: Moderately Isolated   Frequency of Communication with Friends and Family: More than three times a week   Frequency of Social Gatherings with Friends and Family: More than three times a week   Attends Religious Services: More than 4 times per year   Active Member of [employer] or Organizations: [name]   Attends Archivist Meetings: Never   Marital Status: Widowed  Allergies  Allergen Reactions   Bee Venom Anaphylaxis   Family History  Problem Relation Age of Onset   Drug abuse Other    Cancer Other    Heart disease Other    Lung disease Other    Diabetes Mother    Diabetes Sister    Colon cancer Neg Hx    Stomach cancer Neg Hx     Current Outpatient Medications (Endocrine & Metabolic):    glipiZIDE (GLUCOTROL) 5 MG tablet, TAKE 1 TABLET BY MOUTH 2  TIMES DAILY BEFORE A MEAL (Patient taking differently: Take 5 mg by mouth 2 (two) times daily before a meal.)   metFORMIN (GLUCOPHAGE) 1000 MG tablet, TAKE 1 TABLET BY MOUTH  TWICE DAILY WITH MEALS  Current Outpatient Medications (Cardiovascular):    atorvastatin (LIPITOR) 40 MG tablet, Take 1 tablet (40 mg total) by mouth daily at 6 PM.   EPINEPHrine (EPI-PEN) 0.3 mg/0.3 mL DEVI, Inject 0.3 mg into the muscle daily as needed (allergic reaction).   lisinopril (ZESTRIL) 20 MG tablet, Take 20 mg by mouth in the morning.   metoprolol succinate (TOPROL-XL) 100 MG 24 hr tablet, Take 50-100 mg by mouth See admin instructions. Take 1 tablet (100 mg) gy mouth in the morning & take 0.5 tablet (50 mg) by mouth in the evening.   nitroGLYCERIN (NITROSTAT) 0.4 MG SL tablet, Place 1 tablet (0.4 mg total) under the tongue every 5 (five) minutes as needed for chest pain.   spironolactone (ALDACTONE) 25 MG tablet, Take 25 mg by mouth in the morning.   Current Outpatient Medications  (Analgesics):    aspirin 81 MG chewable tablet, Chew 81 mg by mouth in the morning.  Current Outpatient Medications (Hematological):    Cyanocobalamin (VITAMIN B-12) 5000 MCG SUBL, Place 5,000 mcg under the tongue every 3 (three) days.   ELIQUIS 5 MG TABS tablet, Take 5 mg by mouth 2 (two) times daily.  Current Outpatient Medications (Other):    Cholecalciferol (VITAMIN D-3 PO), Take 5,000 Units by mouth in the morning.   gabapentin (NEURONTIN) 100 MG capsule, TAKE 1 CAPSULE BY MOUTH AT  BEDTIME (Patient taking differently: Take 100 mg by mouth at bedtime.)   Insulin Syringes, Disposable, U-100 0.5 ML MISC, Use 1x a day   Multiple Vitamins-Minerals (PRESERVISION AREDS 2 PO), Take 1 tablet by mouth in the morning and at bedtime.   ONETOUCH DELICA LANCETS FINE MISC, Use to check sugar daily   ONETOUCH VERIO test strip, USE 1 STRIP TO CHECK GLUCOSE ONCE DAILY   tamsulosin (FLOMAX) 0.4 MG CAPS capsule, TAKE 1 CAPSULE BY MOUTH  DAILY (Patient taking differently: Take 0.4 mg by mouth in the morning.)   Reviewed prior external information including notes and imaging from  primary care provider As well as notes that were available from care everywhere and other healthcare systems.  Past medical history, social, surgical and family history all reviewed in electronic medical record.  [name] pertanent information unless stated regarding to the chief complaint.   Review of Systems:  [name] headache, visual changes, nausea, vomiting, diarrhea, constipation, dizziness, abdominal pain, skin rash, fevers, chills, night sweats, weight loss, swollen lymph nodes, body aches, joint swelling, chest pain, shortness of breath, mood changes. POSITIVE muscle aches  Objective  Blood pressure 110/80, pulse 76, height '5\' 11"'$  (1.803 m), weight 255 lb (115.7 kg), SpO2 96 %.   General: [name] apparent distress alert and oriented x3 mood and affect normal, dressed appropriately.  HEENT: Pupils equal, extraocular movements intact  Respiratory: Patient's speak in full sentences and does not appear short of breath  Cardiovascular: [name] lower extremity edema, non tender, [name] erythema  Gait normal with good balance and coordination.  MSK: Elbow completely resolved at this time with full range of motion and [name] significant findings of cellulitis.  Right index finger does have a nodule noted at the A2 pulley that is consistent with a trigger nodule.  Patient does have some triggering noted.  Tender to palpation in this area.  Good capillary refill.  Low back exam does have some loss of lordosis.  Some tenderness to palpation that seems to be stable overall.  Tightness with FABER test but negative straight leg test.    Impression and Recommendations:     The above documentation has been reviewed and is accurate and complete Lyndal Pulley, DO

## 2021-05-23 ENCOUNTER — Ambulatory Visit: Payer: Medicare Other | Admitting: Family Medicine

## 2021-05-23 ENCOUNTER — Encounter: Payer: Self-pay | Admitting: Family Medicine

## 2021-05-23 ENCOUNTER — Other Ambulatory Visit: Payer: Self-pay

## 2021-05-23 DIAGNOSIS — M65321 Trigger finger, right index finger: Secondary | ICD-10-CM | POA: Diagnosis not present

## 2021-05-23 DIAGNOSIS — L03113 Cellulitis of right upper limb: Secondary | ICD-10-CM

## 2021-05-23 DIAGNOSIS — M5416 Radiculopathy, lumbar region: Secondary | ICD-10-CM

## 2021-05-23 NOTE — Assessment & Plan Note (Signed)
Trigger finger noted.  We discussed the possibility of some exercises as well as heat and the size as well as bracing at night.  Patient declined injection at this time.  Worsening pain will consider this at follow-up in 2 months.

## 2021-05-23 NOTE — Assessment & Plan Note (Signed)
Resolved on the elbow.

## 2021-05-23 NOTE — Assessment & Plan Note (Signed)
Stable at the moment.  No significant change in management.  We will continue to monitor.  Worsening pain patient knows to come and see if sooner otherwise we will continue to have secondary to the 3 months

## 2021-05-23 NOTE — Patient Instructions (Addendum)
Brace at night for 2 weeks Heat and massage See me in 2-3 months

## 2021-06-08 DIAGNOSIS — E1165 Type 2 diabetes mellitus with hyperglycemia: Secondary | ICD-10-CM | POA: Diagnosis not present

## 2021-06-08 DIAGNOSIS — E1159 Type 2 diabetes mellitus with other circulatory complications: Secondary | ICD-10-CM

## 2021-06-08 DIAGNOSIS — I10 Essential (primary) hypertension: Secondary | ICD-10-CM | POA: Diagnosis not present

## 2021-06-14 DIAGNOSIS — E113293 Type 2 diabetes mellitus with mild nonproliferative diabetic retinopathy without macular edema, bilateral: Secondary | ICD-10-CM | POA: Diagnosis not present

## 2021-06-14 DIAGNOSIS — H43812 Vitreous degeneration, left eye: Secondary | ICD-10-CM | POA: Diagnosis not present

## 2021-06-14 DIAGNOSIS — H34811 Central retinal vein occlusion, right eye, with macular edema: Secondary | ICD-10-CM | POA: Diagnosis not present

## 2021-06-14 DIAGNOSIS — H353133 Nonexudative age-related macular degeneration, bilateral, advanced atrophic without subfoveal involvement: Secondary | ICD-10-CM | POA: Diagnosis not present

## 2021-06-14 DIAGNOSIS — H35372 Puckering of macula, left eye: Secondary | ICD-10-CM | POA: Diagnosis not present

## 2021-06-18 ENCOUNTER — Ambulatory Visit (INDEPENDENT_AMBULATORY_CARE_PROVIDER_SITE_OTHER): Payer: Medicare Other

## 2021-06-18 DIAGNOSIS — E785 Hyperlipidemia, unspecified: Secondary | ICD-10-CM

## 2021-06-18 DIAGNOSIS — E1159 Type 2 diabetes mellitus with other circulatory complications: Secondary | ICD-10-CM

## 2021-06-18 DIAGNOSIS — I4891 Unspecified atrial fibrillation: Secondary | ICD-10-CM

## 2021-06-18 DIAGNOSIS — I10 Essential (primary) hypertension: Secondary | ICD-10-CM

## 2021-06-18 NOTE — Chronic Care Management (AMB) (Signed)
Chronic Care Management   CCM RN Visit Note  06/18/2021 Name: Charles [name] MRN: [id number] DOB: September 22, 1940  Subjective: Charles [name] is a 80 y.o. year old male who is a primary care patient of [name], NP. The care management team was consulted for assistance with disease management and care coordination needs.    Engaged with patient by telephone for follow up visit in response to provider referral for case management and/or care coordination services.   Consent to Services:  The patient was given information about Chronic Care Management services, agreed to services, and gave verbal consent prior to initiation of services.  Please see initial visit note for detailed documentation.   Patient agreed to services and verbal consent obtained.   Assessment: Review of patient past medical history, allergies, medications, health status, including review of consultants reports, laboratory and other test data, was performed as part of comprehensive evaluation and provision of chronic care management services.   SDOH (Social Determinants of Health) assessments and interventions performed:    CCM Care Plan  Allergies  Allergen Reactions   Bee Venom Anaphylaxis    Outpatient Encounter Medications as of 06/18/2021  Medication Sig   aspirin 81 MG chewable tablet Chew 81 mg by mouth in the morning.   atorvastatin (LIPITOR) 40 MG tablet Take 1 tablet (40 mg total) by mouth daily at 6 PM.   Cholecalciferol (VITAMIN D-3 PO) Take 5,000 Units by mouth in the morning.   Cyanocobalamin (VITAMIN B-12) 5000 MCG SUBL Place 5,000 mcg under the tongue every 3 (three) days.   ELIQUIS 5 MG TABS tablet Take 5 mg by mouth 2 (two) times daily.   EPINEPHrine (EPI-PEN) 0.3 mg/0.3 mL DEVI Inject 0.3 mg into the muscle daily as needed (allergic reaction).   gabapentin (NEURONTIN) 100 MG capsule TAKE 1 CAPSULE BY MOUTH AT  BEDTIME (Patient taking differently: Take 100 mg by mouth at bedtime.)   glipiZIDE  (GLUCOTROL) 5 MG tablet TAKE 1 TABLET BY MOUTH 2  TIMES DAILY BEFORE A MEAL (Patient taking differently: Take 5 mg by mouth 2 (two) times daily before a meal.)   Insulin Syringes, Disposable, U-100 0.5 ML MISC Use 1x a day   lisinopril (ZESTRIL) 20 MG tablet Take 20 mg by mouth in the morning.   metFORMIN (GLUCOPHAGE) 1000 MG tablet TAKE 1 TABLET BY MOUTH  TWICE DAILY WITH MEALS   metoprolol succinate (TOPROL-XL) 100 MG 24 hr tablet Take 50-100 mg by mouth See admin instructions. Take 1 tablet (100 mg) gy mouth in the morning & take 0.5 tablet (50 mg) by mouth in the evening.   Multiple Vitamins-Minerals (PRESERVISION AREDS 2 PO) Take 1 tablet by mouth in the morning and at bedtime.   nitroGLYCERIN (NITROSTAT) 0.4 MG SL tablet Place 1 tablet (0.4 mg total) under the tongue every 5 (five) minutes as needed for chest pain.   ONETOUCH DELICA LANCETS FINE MISC Use to check sugar daily   ONETOUCH VERIO test strip USE 1 STRIP TO CHECK GLUCOSE ONCE DAILY   spironolactone (ALDACTONE) 25 MG tablet Take 25 mg by mouth in the morning.   tamsulosin (FLOMAX) 0.4 MG CAPS capsule TAKE 1 CAPSULE BY MOUTH  DAILY (Patient taking differently: Take 0.4 mg by mouth in the morning.)   No facility-administered encounter medications on file as of 06/18/2021.    Patient Active Problem List   Diagnosis Date Noted   Acquired trigger finger of right index finger 05/23/2021   Cellulitis 03/22/2021   Neck  mass 01/08/2021   Atrial fibrillation ([place]) 10/25/2020   Parotid mass 10/25/2020   Epistaxis 09/29/2018   Hyperkalemia 09/29/2018   AKI (acute kidney injury) ([place]) 09/29/2018   Lumbar radiculopathy 08/06/2017   Greater trochanteric bursitis of left hip 07/04/2017   Rotator cuff arthropathy of left shoulder 01/24/2017   AC (acromioclavicular) arthritis 01/24/2017   Vitamin B12 deficiency 01/18/2016   NSTEMI (non-ST elevated myocardial infarction) ([place]) 10/19/2015   Acute non Q wave MI (myocardial infarction),  initial episode of care ([place]) 10/19/2015   Peripheral neuropathy 10/13/2015   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/14/2015   Actinic keratosis of right cheek 10/04/2014   Contact dermatitis and eczema 02/10/2014   Secondary renovascular hypertension, 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   Acute blood loss anemia 08/27/2013   Insomnia 08/27/2013   Hip fracture, right ([place]) 07/25/2013   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   Renal cell carcinoma ([place]) 07/25/2013   History of colonic polyps 07/20/2012   Hyperlipidemia 11/13/2007   Obesity 11/13/2007   Essential hypertension 04/23/2007    Conditions to be addressed/monitored:Atrial Fibrillation, CAD, HTN, HLD, and DMII  Care Plan : RNCM:Atrial Fibrillation (Adult)  Updates made by Dimitri Ped, RN since 06/18/2021 12:00 AM     Problem: Lack of self mangement plan for Atrial Fibrillation   Priority: High     Long-Range Goal: RNCM: Develolp and self manage atrial fibrillation   Start Date: 10/18/2020  Expected End Date: 12/08/2021  This Visit's Progress: On track  Recent Progress: On track  Priority: High  Note:   Current Barriers:  Knowledge Deficits related to new dx of atrial fibrillation with hx of HTN and HLD -Reports no atrial fibrillation symptoms. Denies any shortness of breath,chest pain or swelling.  Reports taking Eliquis as directed.  States he saw Dr. Terrence Dupont in September and he did not change any of his medications. States he is to see him again in January. Denies any swelling and his weight has been stable.  States his B/P usually good when he checks it at the [facility].    Chronic Disease Management support and education needs related to Atrial fibrillation Nurse Case Manager Clinical Goal(s):  Over the next 120 days, patient will verbalize understanding of plan for self management of atrial fibrillation Over the next 120 days, patient will verbalize basic understanding of atrial fibrillation disease process  and self health management plan as evidenced by decrease in symptoms, adherence to medications, keeping provider appts  Interventions:  1:1 collaboration with [name], NP regarding development and update of comprehensive plan of care as evidenced by provider attestation and co-signature Inter-disciplinary care team collaboration (see longitudinal plan of care)  Reinforced patient to call provider for worsening symptoms of atrial fibrillation  Reinforced education to patient re: atrial fibrillation disease process and atrial fibrillation action plan  Reviewed medications with patient and discussed importance of taking medications as ordered to lower heart rate and taking anticoagulant Reviewed scheduled/upcoming provider appointments including: Dr. Terrence Dupont cardiology January 2023, Dr. Cruzita Lederer 08/20/21 Discussed plans with patient for ongoing care management follow up and provided patient with direct contact information for care management team Reinforced to keep a log of his weights and B/P to take to his appt with Dr.Harwani Patient Goals/Self-Care Activities Patient will attend all scheduled provider appointments Patient will call provider office for new concerns or questions - check pulse (heart) rate once a day - cut down alcohol use - make a plan to exercise regularly - make a plan  to eat healthy - take medicine as prescribed -review atrial fibrillation action plan -take anticoagulant to prevent stroke -take log of B/P and weights to cardiology visit Follow Up Plan: Telephone follow up appointment with care management team member scheduled for: 08/24/21 at 9 AM The patient has been provided with contact information for the care management team and has been advised to call with any health related questions or concerns.          Care Plan : RNCM:Diabetes Type 2 (Adult)  Updates made by Dimitri Ped, RN since 06/18/2021 12:00 AM     Problem: RNCM: Lack of effective self  management of Diabetes Type 2   Priority: Medium     Long-Range Goal: Effective self management of blood sugars and Diabetes Type 2   Start Date: 10/18/2020  Expected End Date: 12/08/2021  This Visit's Progress: On track  Recent Progress: On track  Priority: Medium  Note:   Objective:  Lab Results  Component Value Date   HGBA1C 7.9 (A) 08/21/2020   Lab Results  Component Value Date   CREATININE 1.06 10/06/2020   CREATININE 1.02 10/08/2019   CREATININE 1.21 10/26/2018  Current Barriers:  Knowledge Deficits related to basic Diabetes pathophysiology and self care/management with hx of HTN and atrial fib.  Does not adhere to provider recommendations re: Low CHO diet and portion control. States his fasting readings range from 105-160.  States he only has the higher readings once or twice a week with most readings in the low 100's.  States he can not identify anything different on the days his CBG is higher.  States his diet and exercise are the same  States he was checking his after meal readings a few times but has not checked much recently. Denies any hypoglycemia.  States he is watching portions better and avoiding second helpings.  States he had been eating more vegetables in the summer.  States he is still going to gym 5 days a week walking 1-2 miles and working on IT trainer.  States he has had an issue with his rt hand and saw sports medicine for his trigger finger.  States he might need a cortisone shot in his hand Case Manager Clinical Goal(s):  patient will demonstrate improved adherence to prescribed treatment plan for diabetes self care/management as evidenced by: daily monitoring and recording of CBG  adherence to ADA/ carb modified diet exercise 3 days/week adherence to prescribed medication regimen Interventions:  Collaboration with [name], NP regarding development and update of comprehensive plan of care as evidenced by provider attestation and  co-signature Inter-disciplinary care team collaboration (see longitudinal plan of care) Reinforced education to patient about basic DM disease process Reviewed medications with patient and discussed importance of medication adherence Discussed plans with patient for ongoing care management follow up and provided patient with direct contact information for care management team Reviewed scheduled/upcoming provider appointments including: Dr. Terrence Dupont cardiology January 2023, Dr. Cruzita Lederer endocrinologist 08/20/21, Dr. Tamala Julian sports med 08/08/21 Reinforced  to check cbg daily at varying times and record, calling provider for findings outside established parameters.   Reinforced importance of getting regular exercise with resistance training at least 2 times a week Reinforced the rule of 15 for hypoglycemia and discussed sources of fast acting sugar to have on hand Reviewed how cortisone shot can increase his blood sugar temporarily  Patient Goals/Self-Care Activities Over the next 90 days, patient will:  - Self administers oral medications as prescribed Attends all scheduled provider appointments  Checks blood sugars as prescribed and utilize hyper and hypoglycemia protocol as needed Adheres to prescribed ADA/carb modified - check blood sugar at prescribed times try checking 1 1/2-2 hours after eating -try checking blood sugar after eating at different times -recheck blood sugar if you get an unexpected number - check blood sugar before and after exercise - check blood sugar if I feel it is too high or too low - enter blood sugar readings and medication or insulin into daily log - take the blood sugar log to all doctor visits - take the blood sugar meter to all doctor visits  -keep glucose tablets or some other fast acting sugar on hand to treat low blood sugars Follow Up Plan: Telephone follow up appointment with care management team member scheduled for: 08/24/21 at 9 AM The patient has been  provided with contact information for the care management team and has been advised to call with any health related questions or concerns.       Plan:Telephone follow up appointment with care management team member scheduled for:  08/24/21 and The patient has been provided with contact information for the care management team and has been advised to call with any health related questions or concerns.  Peter Garter RN, Jackquline Denmark, CDE Care Management Coordinator [facility] [phone], Mobile [phone]

## 2021-06-18 NOTE — Patient Instructions (Signed)
Visit Information  PATIENT GOALS:  Goals Addressed             This Visit's Progress    RNCM: Better self manage and track my diabetes -Diabetes Type 2   On track    Timeframe:  Long-Range Goal Priority:  Medium Start Date:    10/18/20                       Expected End Date:        12/08/21            Follow Up Date by 08/24/21    - check blood sugar at prescribed times try checking 1 1/2-2 hours after eating -try checking blood sugar after eating at different times -recheck blood sugar if you get an unexpected number - check blood sugar before and after exercise - check blood sugar if I feel it is too high or too low - enter blood sugar readings and medication or insulin into daily log - take the blood sugar log to all doctor visits - take the blood sugar meter to all doctor visits   -keep glucose tablets or some other fast acting sugar on hand to treat low blood sugars Why is this important?   Checking your blood sugar at home helps to keep it from getting very high or very low.  Writing the results in a diary or log helps the doctor know how to care for you.  Your blood sugar log should have the time, date and the results.  Also, write down the amount of insulin or other medicine that you take.  Other information, like what you ate, exercise done and how you were feeling, will also be helpful.     Notes:      RNCM: Develop and follow self management plan for Atrial Fibrillation   On track    Timeframe:  Long-Range Goal Priority:  High Start Date:      10/18/20                       Expected End Date:      12/08/21                Follow Up Date   08/24/21    - check pulse (heart) rate once a day - cut down alcohol use - make a plan to exercise regularly - make a plan to eat healthy - take medicine as prescribed  -review atrial fibrillation action plan -take anticoagulant to prevent stroke -take log of B/P and weights to cardiology visit Why is this important?   Atrial  fibrillation may have no symptoms. Sometimes the symptoms get worse or happen more often.  It is important to keep track of what your symptoms are and when they happen.  A change in symptoms is important to discuss with your doctor or nurse.  Being active and healthy eating will also help you manage your heart condition.     Notes:       Hand Exercises Hand exercises can be helpful for almost anyone. These exercises can strengthen the hands, improve flexibility and movement, and increase blood flow to the hands. These results can make work and daily tasks easier. Hand exercises can be especially helpful for people who have joint pain from arthritis or have nerve damage from overuse (carpal tunnel syndrome). These exercises can also help people who have injured a hand. Exercises Most of these hand  exercises are gentle stretching and motion exercises. It is usually safe to do them often throughout the day. Warming up your hands before exercise may help to reduce stiffness. You can do this with gentle massage or by placing your hands in warm water for 10-15 minutes. It is normal to feel some stretching, pulling, tightness, or mild discomfort as you begin new exercises. This will gradually improve. Stop an exercise right away if you feel sudden, severe pain or your pain gets worse. Ask your health care provider which exercises are best for you. Knuckle bend or "claw" fist  Stand or sit with your arm, hand, and all five fingers pointed straight up. Make sure to keep your wrist straight during the exercise. Gently bend your fingers down toward your palm until the tips of your fingers are touching the top of your palm. Keep your big knuckle straight and just bend the small knuckles in your fingers. Hold this position for __________ seconds. Straighten (extend) your fingers back to the starting position. Repeat this exercise 5-10 times with each hand. Full finger fist  Stand or sit with your arm,  hand, and all five fingers pointed straight up. Make sure to keep your wrist straight during the exercise. Gently bend your fingers into your palm until the tips of your fingers are touching the middle of your palm. Hold this position for __________ seconds. Extend your fingers back to the starting position, stretching every joint fully. Repeat this exercise 5-10 times with each hand. Straight fist Stand or sit with your arm, hand, and all five fingers pointed straight up. Make sure to keep your wrist straight during the exercise. Gently bend your fingers at the big knuckle, where your fingers meet your hand, and the middle knuckle. Keep the knuckle at the tips of your fingers straight and try to touch the bottom of your palm. Hold this position for __________ seconds. Extend your fingers back to the starting position, stretching every joint fully. Repeat this exercise 5-10 times with each hand. Tabletop  Stand or sit with your arm, hand, and all five fingers pointed straight up. Make sure to keep your wrist straight during the exercise. Gently bend your fingers at the big knuckle, where your fingers meet your hand, as far down as you can while keeping the small knuckles in your fingers straight. Think of forming a tabletop with your fingers. Hold this position for __________ seconds. Extend your fingers back to the starting position, stretching every joint fully. Repeat this exercise 5-10 times with each hand. Finger spread  Place your hand flat on a table with your palm facing down. Make sure your wrist stays straight as you do this exercise. Spread your fingers and thumb apart from each other as far as you can until you feel a gentle stretch. Hold this position for __________ seconds. Bring your fingers and thumb tight together again. Hold this position for __________ seconds. Repeat this exercise 5-10 times with each hand. Making circles  Stand or sit with your arm, hand, and all five  fingers pointed straight up. Make sure to keep your wrist straight during the exercise. Make a circle by touching the tip of your thumb to the tip of your index finger. Hold for __________ seconds. Then open your hand wide. Repeat this motion with your thumb and each finger on your hand. Repeat this exercise 5-10 times with each hand. Thumb motion  Sit with your forearm resting on a table and your wrist straight. Your  thumb should be facing up toward the ceiling. Keep your fingers relaxed as you move your thumb. Lift your thumb up as high as you can toward the ceiling. Hold for __________ seconds. Bend your thumb across your palm as far as you can, reaching the tip of your thumb for the small finger (pinkie) side of your palm. Hold for __________ seconds. Repeat this exercise 5-10 times with each hand. Grip strengthening  Hold a stress ball or other soft ball in the middle of your hand. Slowly increase the pressure, squeezing the ball as much as you can without causing pain. Think of bringing the tips of your fingers into the middle of your palm. All of your finger joints should bend when doing this exercise. Hold your squeeze for __________ seconds, then relax. Repeat this exercise 5-10 times with each hand. Contact a health care provider if: Your hand pain or discomfort gets much worse when you do an exercise. Your hand pain or discomfort does not improve within 2 hours after you exercise. If you have any of these problems, stop doing these exercises right away. Do not do them again unless your health care provider says that you can. Get help right away if: You develop sudden, severe hand pain or swelling. If this happens, stop doing these exercises right away. Do not do them again unless your health care provider says that you can. This information is not intended to replace advice given to you by your health care provider. Make sure you discuss any questions you have with your health care  provider. Document Revised: 12/14/2020 Document Reviewed: 12/14/2020 Elsevier Patient Education  2022 [place].   Patient verbalizes understanding of instructions provided today and agrees to view in [place].   Telephone follow up appointment with care management team member scheduled for: 08/24/21 at [place] RN, [facility], CDE Care Management Coordinator [facility] [phone], Mobile [phone]

## 2021-07-09 DIAGNOSIS — E785 Hyperlipidemia, unspecified: Secondary | ICD-10-CM

## 2021-07-09 DIAGNOSIS — E1165 Type 2 diabetes mellitus with hyperglycemia: Secondary | ICD-10-CM | POA: Diagnosis not present

## 2021-07-09 DIAGNOSIS — I4891 Unspecified atrial fibrillation: Secondary | ICD-10-CM

## 2021-07-09 DIAGNOSIS — I10 Essential (primary) hypertension: Secondary | ICD-10-CM | POA: Diagnosis not present

## 2021-07-09 DIAGNOSIS — E1159 Type 2 diabetes mellitus with other circulatory complications: Secondary | ICD-10-CM

## 2021-08-07 NOTE — Progress Notes (Signed)
[place] [facility] [place] Phone: [phone] Subjective:   Fontaine No, am serving as a scribe for Dr. Hulan Saas.  This visit occurred during the SARS-CoV-2 public health emergency.  Safety protocols were in place, including screening questions prior to the visit, additional usage of staff PPE, and extensive cleaning of exam room while observing appropriate contact time as indicated for disinfecting solutions.   I'm seeing this patient by the request  of:  [name], NP  CC: Shoulder pain and finger pain follow-up  [id number]  05/23/2021 Stable at the moment.  No significant change in management.  We will continue to monitor.  Worsening pain patient knows to come and see if sooner otherwise we will continue to have secondary to the 3 months  Trigger finger noted.  We discussed the possibility of some exercises as well as heat and the size as well as bracing at night.  Patient declined injection at this time.  Worsening pain will consider this at follow-up in 2 months.  Update 08/08/2021 AARUSH STUKEY is a 80 y.o. male coming in with complaint of lumbar spine and R hand trigger finger. Patient states that he feels the same as last visit. No change in back pain. Does note that stretching helps his pain but he does not do them consistently.   States that trigger finger is the same as last visit. Is not experiencing catching but does have popping in finger.        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[place]) 2000   left nephrectomy   Coronary artery disease    Dysrhythmia    PAF   Eczema    GERD (gastroesophageal reflux disease)    Hernia, ventral    History of blood transfusion    Hyperlipidemia    Hypertension    Myocardial infarction ([place])     Neuropathy    Obesity    Personal history of adenomatous colonic polyps 07/20/2012   3 + adenomas 2009 07/20/2012 2 diminutive polyps     Pneumonia 1942; 1950s X 1   Type II diabetes mellitus ([place])    type II   Walking pneumonia 2000's X 1   Past Surgical History:  Procedure Laterality Date   CARDIAC CATHETERIZATION N/A 10/19/2015   Procedure: Left Heart Cath and Coronary Angiography;  Surgeon: Charolette Forward, MD;  Location: [facility];  Service: Cardiovascular;  Laterality: N/A;   CARDIAC CATHETERIZATION N/A 10/19/2015   Procedure: Coronary Stent Intervention;  Surgeon: Charolette Forward, MD;  Location: [facility];  Service: Cardiovascular;  Laterality: N/A;   CATARACT EXTRACTION, BILATERAL Bilateral 01/2018   Dr. Kathrin Penner did surgery and he developed MD and is seeing specialist now for MD treatment   COLONOSCOPY     "nothing showed up this time"   COLONOSCOPY W/ BIOPSIES AND POLYPECTOMY  X 1   CORONARY ANGIOPLASTY     EXCISION MASS NECK Left 01/08/2021   Procedure: EXCISION MASS NECK;  Surgeon: Melida Quitter, MD;  Location: [facility];  Service: ENT;  Laterality: Left;   FEMUR IM NAIL Right 07/26/2013   Procedure: INTRAMEDULLARY (IM) NAIL FEMORAL subtrochanteric;  Surgeon: Mauri Pole, MD;  Location: [facility];  Service:  Orthopedics;  Laterality: Right;   FRACTURE SURGERY     INGUINAL HERNIA REPAIR Left >3 times   INGUINAL HERNIA REPAIR Right 2012   NEPHRECTOMY Left 2000   TONSILLECTOMY  1940s   Social History   Socioeconomic History   Marital status: Widowed    Spouse name: Not on file   Number of children: 3   Years of education: Not on file   Highest education level: Not on file  Occupational History   Occupation: retired  Tobacco Use   Smoking status: Former    Years: 20.00    Types: Cigarettes, Pipe, Cigars    Quit date: 09/09/1968    Years since quitting: 52.9   Smokeless tobacco: Never   Tobacco comments:    smoking cigars- quit in 2006  , quit pipe 2000   Vaping Use   Vaping Use: Never used  Substance and Sexual Activity   Alcohol use: Yes    Alcohol/week: 2.0 standard drinks    Types: 2 Glasses of wine per week    Comment: 2 glasses on Tuesday   Drug use: No   Sexual activity: Never  Other Topics Concern   Not on file  Social History Narrative   Regular exercise: goes to the [facility] and walks   Caffeine use: daily; coffee   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     widowed    Three children, One in [place], one in [place] one in [place] - 2 : youngest daughter lives with him   Social Determinants of Health   Financial Resource Strain: Low Risk    Difficulty of Paying Living Expenses: Not hard at all  Food Insecurity: No Food Insecurity   Worried About Charity fundraiser in the Last Year: Never true   Arboriculturist in the Last Year: Never true  Transportation Needs: No Transportation Needs   Lack of Transportation (Medical): No   Lack of Transportation (Non-Medical): No  Physical Activity: Sufficiently Active   Days of Exercise per Week: 5 days   Minutes of Exercise per Session: 120 min  Stress: No Stress Concern Present   Feeling of Stress : Not at all  Social Connections: Moderately Isolated   Frequency of Communication with Friends and Family: More than three times a week   Frequency of Social Gatherings with Friends and Family: More than three times a week   Attends Religious Services: More than 4 times per year   Active Member of [employer] or Organizations: No   Attends Archivist Meetings: Never   Marital Status: Widowed   Allergies  Allergen Reactions   Bee Venom Anaphylaxis   Family History  Problem Relation Age of Onset   Drug abuse Other    Cancer Other    Heart disease Other    Lung disease Other    Diabetes Mother    Diabetes Sister    Colon cancer Neg Hx    Stomach cancer Neg Hx     Current Outpatient Medications (Endocrine & Metabolic):    glipiZIDE (GLUCOTROL) 5 MG  tablet, TAKE 1 TABLET BY MOUTH 2  TIMES DAILY BEFORE A MEAL (Patient taking differently: Take 5 mg by mouth 2 (two) times daily before a meal.)   metFORMIN (GLUCOPHAGE) 1000 MG tablet, TAKE 1 TABLET BY MOUTH  TWICE DAILY WITH MEALS  Current Outpatient Medications (Cardiovascular):    atorvastatin (LIPITOR) 40 MG tablet, Take 1 tablet (40 mg total) by mouth daily  at 6 PM.   EPINEPHrine (EPI-PEN) 0.3 mg/0.3 mL DEVI, Inject 0.3 mg into the muscle daily as needed (allergic reaction).   lisinopril (ZESTRIL) 20 MG tablet, Take 20 mg by mouth in the morning.   metoprolol succinate (TOPROL-XL) 100 MG 24 hr tablet, Take 50-100 mg by mouth See admin instructions. Take 1 tablet (100 mg) gy mouth in the morning & take 0.5 tablet (50 mg) by mouth in the evening.   nitroGLYCERIN (NITROSTAT) 0.4 MG SL tablet, Place 1 tablet (0.4 mg total) under the tongue every 5 (five) minutes as needed for chest pain.   spironolactone (ALDACTONE) 25 MG tablet, Take 25 mg by mouth in the morning.   Current Outpatient Medications (Analgesics):    aspirin 81 MG chewable tablet, Chew 81 mg by mouth in the morning.  Current Outpatient Medications (Hematological):    Cyanocobalamin (VITAMIN B-12) 5000 MCG SUBL, Place 5,000 mcg under the tongue every 3 (three) days.   ELIQUIS 5 MG TABS tablet, Take 5 mg by mouth 2 (two) times daily.  Current Outpatient Medications (Other):    Cholecalciferol (VITAMIN D-3 PO), Take 5,000 Units by mouth in the morning.   gabapentin (NEURONTIN) 100 MG capsule, TAKE 1 CAPSULE BY MOUTH AT  BEDTIME (Patient taking differently: Take 100 mg by mouth at bedtime.)   Insulin Syringes, Disposable, U-100 0.5 ML MISC, Use 1x a day   Multiple Vitamins-Minerals (PRESERVISION AREDS 2 PO), Take 1 tablet by mouth in the morning and at bedtime.   ONETOUCH DELICA LANCETS FINE MISC, Use to check sugar daily   ONETOUCH VERIO test strip, USE 1 STRIP TO CHECK GLUCOSE ONCE DAILY   tamsulosin (FLOMAX) 0.4 MG CAPS  capsule, TAKE 1 CAPSULE BY MOUTH  DAILY (Patient taking differently: Take 0.4 mg by mouth in the morning.)     Objective  Blood pressure 132/80, pulse 78, height 5\' 11"  (1.803 m), weight 250 lb (113.4 kg), SpO2 98 %.   General: No apparent distress alert and oriented x3 mood and affect normal, dressed appropriately.  HEENT: Pupils equal, extraocular movements intact  Respiratory: Patient's speak in full sentences and does not appear short of breath  Cardiovascular: No lower extremity edema, non tender, no erythema  Gait normal with good balance and coordination.  MSK: Shoulder pain is very minimal at this time.  Good range of motion noted.  Patient has some mild impingement noted still.  Patient seen exam shows the patient does have a trigger nodule of the right index finger at the A2 pulley.  Minorly tender to palpation especially with flexion and palpation.    Impression and Recommendations:     The above documentation has been reviewed and is accurate and complete Lyndal Pulley, DO

## 2021-08-08 ENCOUNTER — Encounter: Payer: Self-pay | Admitting: Family Medicine

## 2021-08-08 ENCOUNTER — Ambulatory Visit: Payer: Self-pay

## 2021-08-08 ENCOUNTER — Ambulatory Visit: Payer: Medicare Other | Admitting: Family Medicine

## 2021-08-08 ENCOUNTER — Other Ambulatory Visit: Payer: Self-pay

## 2021-08-08 VITALS — BP 132/80 | HR 78 | Ht 71.0 in | Wt 250.0 lb

## 2021-08-08 DIAGNOSIS — M65321 Trigger finger, right index finger: Secondary | ICD-10-CM

## 2021-08-08 DIAGNOSIS — M5416 Radiculopathy, lumbar region: Secondary | ICD-10-CM | POA: Diagnosis not present

## 2021-08-08 NOTE — Patient Instructions (Signed)
Good to see you Try to be more active with exercises See me when you need me

## 2021-08-08 NOTE — Assessment & Plan Note (Signed)
Known chronic back pain.  Discussed the possibility of further evaluation.  Patient wants to avoid any type of significant surgical intervention.  Patient has had a history of cancer though I do feel that we need to continue to monitor.

## 2021-08-08 NOTE — Assessment & Plan Note (Signed)
Patient was seen wants to continue with conservative therapy.  Discussed potential ultrasound-guided injection which patient declined.  Discussed avoiding certain activities.  Follow-up when patient does have increasing discomfort can consider injection.

## 2021-08-09 DIAGNOSIS — H34811 Central retinal vein occlusion, right eye, with macular edema: Secondary | ICD-10-CM | POA: Diagnosis not present

## 2021-08-09 DIAGNOSIS — H353133 Nonexudative age-related macular degeneration, bilateral, advanced atrophic without subfoveal involvement: Secondary | ICD-10-CM | POA: Diagnosis not present

## 2021-08-09 DIAGNOSIS — E113293 Type 2 diabetes mellitus with mild nonproliferative diabetic retinopathy without macular edema, bilateral: Secondary | ICD-10-CM | POA: Diagnosis not present

## 2021-08-09 DIAGNOSIS — H35372 Puckering of macula, left eye: Secondary | ICD-10-CM | POA: Diagnosis not present

## 2021-08-20 ENCOUNTER — Encounter: Payer: Self-pay | Admitting: Internal Medicine

## 2021-08-20 ENCOUNTER — Other Ambulatory Visit: Payer: Self-pay

## 2021-08-20 ENCOUNTER — Ambulatory Visit: Payer: Medicare Other | Admitting: Internal Medicine

## 2021-08-20 VITALS — BP 120/78 | HR 67 | Ht 71.0 in | Wt 258.2 lb

## 2021-08-20 DIAGNOSIS — E538 Deficiency of other specified B group vitamins: Secondary | ICD-10-CM

## 2021-08-20 DIAGNOSIS — E785 Hyperlipidemia, unspecified: Secondary | ICD-10-CM | POA: Diagnosis not present

## 2021-08-20 DIAGNOSIS — E1159 Type 2 diabetes mellitus with other circulatory complications: Secondary | ICD-10-CM | POA: Diagnosis not present

## 2021-08-20 DIAGNOSIS — E1165 Type 2 diabetes mellitus with hyperglycemia: Secondary | ICD-10-CM

## 2021-08-20 LAB — POCT GLYCOSYLATED HEMOGLOBIN (HGB A1C): Hemoglobin A1C: 7.5 % — AB (ref 4.0–5.6)

## 2021-08-20 NOTE — Patient Instructions (Addendum)
Please continue: - Metformin 1000 mg 2x a day with meals  Try to increase: - Glipizide 5 mg in am and 10 mg before dinner  Please check some sugars later in the day, also.  After the first of the year, check with your insurance if they cover: Noni Saupe, [place].  Please return in 3-4 months with your sugar log.

## 2021-08-20 NOTE — Progress Notes (Signed)
Patient ID: Charles [name], male   DOB: [birth year], 80 y.o.   MRN: [id number]  This visit occurred during the SARS-CoV-2 public health emergency.  Safety protocols were in place, including screening questions prior to the visit, additional usage of staff PPE, and extensive cleaning of exam room while observing appropriate contact time as indicated for disinfecting solutions.   HPI: Charles [name] is a 80 y.o.-year-old male, returning for f/u for DM2 dx 2008, uncontrolled, with complications (CAD - s/p NSTEMI, PN, mild CKD). Last visit 4 months ago.  Interim history: No increased urination, blurry vision, nausea, chest pain. He did notice some leg swelling, which has improved lately.  DM2: Reviewed HbA1c levels: Lab Results  Component Value Date   HGBA1C 6.9 (A) 05/01/2021   HGBA1C 7.3 (A) 12/19/2020   HGBA1C 7.9 (A) 08/21/2020  12/2013: 8.6%  Pt is on a regimen of: - Metformin 1000 mg 2x a day >> 2000 mg with dinner (but sugars worse) >> 1000 mg 2x a day - Glipizide 10 >> 5 mg 2x a day before meals  He did not start Ozempic 0.25 mg weekly -08/2020 >> too expensive. In the past, I recommended  NPH 14 units at bedtime, but he did not start. We tried Rybelsus 3 >> 6 mg daily-added 12/2018 >> $$$ >> stopped 01/2019 He checked with his insurance and none of the SGLT2 inhibitors are covered for him.  Pt checks his sugars once a day: - am: 88-124 >> 104-181, 194 (pizza) >> 90s, 110-150, 200 - 2h after b'fast: 68x1 >> n/c >> 201, 206 >> n/c - before lunch:  n/c >> 69, 79 >> n/c >> 90 x1 >> n/c - 2h after lunch: 123-176 >> n/c - before dinner  n/c >> 160s >> n/c >> 97 >> 90s - 2h after dinner: n/c >> 240 >> n/c - bedtime: 110-150 >> 160s >> n/c Lowest sugar was  71 >> 88 >> 97 >> 80s; he has hypoglycemia awareness in the 80s. Highest sugar was 218 >> 120s >> 194 >> 200 (forgot meds at night).  -+ Mild CKD, last BUN/creatinine:  Lab Results  Component Value Date   BUN 17 04/06/2021    CREATININE 1.13 04/06/2021  Of note, he has a history of nephrectomy. On lisinopril 10.  -+ HL; last set of lipids: Lab Results  Component Value Date   CHOL 121 10/06/2020   HDL 46.20 10/06/2020   LDLCALC 56 10/06/2020   LDLDIRECT 139.8 07/08/2014   TRIG 92.0 10/06/2020   CHOLHDL 3 10/06/2020  On Lipitor 40.  - last eye exam was in 2022: No DR; now seeing Dr. [place] and Dr. Iona Hansen- retinal vein occlusion, + cataracts, + macular "hole".  He has a history of eye surgeries.  He gets intraocular injections  -his numbness and tingling in feet improved after starting B12.  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  B12 deficiency: -Diagnosed during investigation for numbness and tingling (peripheral neuropathy) -He was initially on B12 injections, then on 5000 mcg daily -He was on 5000 mcg every 3 days, changed by PCP 09/2019.  Afterwards, we switched to 1000 mcg every day, but when he ran out he started back on 5000 mcg and he takes this every 3 days again.  Reviewed his B12 levels: Lab Results  Component Value Date   VITAMINB12 900 10/06/2020  VITAMINB12 748 12/22/2019   VITAMINB12 1,033 (H) 10/08/2019   VITAMINB12 1,172 (H) 08/19/2018   VITAMINB12 >1500 (H) 05/04/2018   VITAMINB12 1,249 (H) 02/18/2017   VITAMINB12 234 10/22/2016   VITAMINB12 217 04/19/2016   VITAMINB12 94 (L) 10/13/2015   He was admitted with NSTEMI 10/18/2015.  He had stents placed then.  He was going to the gym 5 out of 7 days, but stopped during the coronavirus pandemic.  He then restarted to go to the gym.  On tamsulosin.  ROS: + See HPI  I reviewed pt's medications, allergies, PMH, social hx, family hx, and changes were documented in the history of present illness. Otherwise, unchanged from my initial visit note.  Past Medical History:  Diagnosis Date   Anemia     "when I was a lad"   Arthritis    left and right arm   AVM (arteriovenous malformation) of colon    2 - non-bleeding 2013   Cancer of kidney ([place]) 2000   left nephrectomy   Coronary artery disease    Dysrhythmia    PAF   Eczema    GERD (gastroesophageal reflux disease)    Hernia, ventral    History of blood transfusion    Hyperlipidemia    Hypertension    Myocardial infarction ([place])    Neuropathy    Obesity    Personal history of adenomatous colonic polyps 07/20/2012   3 + adenomas 2009 07/20/2012 2 diminutive polyps     Pneumonia 1942; 1950s X 1   Type II diabetes mellitus ([place])    type II   Walking pneumonia 2000's X 1   Past Surgical History:  Procedure Laterality Date   CARDIAC CATHETERIZATION N/A 10/19/2015   Procedure: Left Heart Cath and Coronary Angiography;  Surgeon: Charolette Forward, MD;  Location: [facility];  Service: Cardiovascular;  Laterality: N/A;   CARDIAC CATHETERIZATION N/A 10/19/2015   Procedure: Coronary Stent Intervention;  Surgeon: Charolette Forward, MD;  Location: [facility];  Service: Cardiovascular;  Laterality: N/A;   CATARACT EXTRACTION, BILATERAL Bilateral 01/2018   Dr. Kathrin Penner did surgery and he developed MD and is seeing specialist now for MD treatment   COLONOSCOPY     "nothing showed up this time"   COLONOSCOPY W/ BIOPSIES AND POLYPECTOMY  X 1   CORONARY ANGIOPLASTY     EXCISION MASS NECK Left 01/08/2021   Procedure: EXCISION MASS NECK;  Surgeon: Melida Quitter, MD;  Location: [facility];  Service: ENT;  Laterality: Left;   FEMUR IM NAIL Right 07/26/2013   Procedure: INTRAMEDULLARY (IM) NAIL FEMORAL subtrochanteric;  Surgeon: Mauri Pole, MD;  Location: [facility];  Service: Orthopedics;  Laterality: Right;   FRACTURE SURGERY     INGUINAL HERNIA REPAIR Left >3 times   INGUINAL HERNIA REPAIR Right 2012   NEPHRECTOMY Left 2000   TONSILLECTOMY  1940s   Social History   Socioeconomic History   Marital status: Widowed    Spouse name:  Not on file   Number of children: 3   Years of education: Not on file   Highest education level: Not on file  Occupational History   Occupation: retired  Tobacco Use   Smoking status: Former    Years: 20.00    Types: Cigarettes, Pipe, Cigars    Quit date: 09/09/1968    Years since quitting: 52.9   Smokeless tobacco: Never   Tobacco comments:    smoking cigars- quit in 2006  , quit pipe 2000  Vaping  Use   Vaping Use: Never used  Substance and Sexual Activity   Alcohol use: Yes    Alcohol/week: 2.0 standard drinks    Types: 2 Glasses of wine per week    Comment: 2 glasses on Tuesday   Drug use: No   Sexual activity: Never  Other Topics Concern   Not on file  Social History Narrative   Regular exercise: goes to the [facility] and walks   Caffeine use: daily; coffee   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     widowed    Three children, One in [place], one in [place] one in [place] - 2 : youngest daughter lives with him   Social Determinants of Health   Financial Resource Strain: Low Risk    Difficulty of Paying Living Expenses: Not hard at all  Food Insecurity: No Food Insecurity   Worried About Charity fundraiser in the Last Year: Never true   Arboriculturist in the Last Year: Never true  Transportation Needs: No Transportation Needs   Lack of Transportation (Medical): No   Lack of Transportation (Non-Medical): No  Physical Activity: Sufficiently Active   Days of Exercise per Week: 5 days   Minutes of Exercise per Session: 120 min  Stress: No Stress Concern Present   Feeling of Stress : Not at all  Social Connections: Moderately Isolated   Frequency of Communication with Friends and Family: More than three times a week   Frequency of Social Gatherings with Friends and Family: More than three times a week   Attends Religious Services: More than 4 times per year   Active Member of [employer] or Organizations: No   Attends Archivist Meetings: Never    Marital Status: Widowed  Human resources officer Violence: Not At Risk   Fear of Current or Ex-Partner: No   Emotionally Abused: No   Physically Abused: No   Sexually Abused: No   Current Outpatient Medications on File Prior to Visit  Medication Sig Dispense Refill   aspirin 81 MG chewable tablet Chew 81 mg by mouth in the morning.     atorvastatin (LIPITOR) 40 MG tablet Take 1 tablet (40 mg total) by mouth daily at 6 PM. 30 tablet 3   Cholecalciferol (VITAMIN D-3 PO) Take 5,000 Units by mouth in the morning.     Cyanocobalamin (VITAMIN B-12) 5000 MCG SUBL Place 5,000 mcg under the tongue every 3 (three) days.     ELIQUIS 5 MG TABS tablet Take 5 mg by mouth 2 (two) times daily.     EPINEPHrine (EPI-PEN) 0.3 mg/0.3 mL DEVI Inject 0.3 mg into the muscle daily as needed (allergic reaction).     gabapentin (NEURONTIN) 100 MG capsule TAKE 1 CAPSULE BY MOUTH AT  BEDTIME (Patient taking differently: Take 100 mg by mouth at bedtime.) 90 capsule 3   glipiZIDE (GLUCOTROL) 5 MG tablet TAKE 1 TABLET BY MOUTH 2  TIMES DAILY BEFORE A MEAL (Patient taking differently: Take 5 mg by mouth 2 (two) times daily before a meal.) 180 tablet 3   Insulin Syringes, Disposable, U-100 0.5 ML MISC Use 1x a day 100 each 3   lisinopril (ZESTRIL) 20 MG tablet Take 20 mg by mouth in the morning.     metFORMIN (GLUCOPHAGE) 1000 MG tablet TAKE 1 TABLET BY MOUTH  TWICE DAILY WITH MEALS 180 tablet 3   metoprolol succinate (TOPROL-XL) 100 MG 24 hr tablet Take 50-100 mg by mouth See admin  instructions. Take 1 tablet (100 mg) gy mouth in the morning & take 0.5 tablet (50 mg) by mouth in the evening.     Multiple Vitamins-Minerals (PRESERVISION AREDS 2 PO) Take 1 tablet by mouth in the morning and at bedtime.     nitroGLYCERIN (NITROSTAT) 0.4 MG SL tablet Place 1 tablet (0.4 mg total) under the tongue every 5 (five) minutes as needed for chest pain. 25 tablet 1   ONETOUCH DELICA LANCETS FINE MISC Use to check sugar daily 100 each 5    ONETOUCH VERIO test strip USE 1 STRIP TO CHECK GLUCOSE ONCE DAILY 100 each 3   spironolactone (ALDACTONE) 25 MG tablet Take 25 mg by mouth in the morning.     tamsulosin (FLOMAX) 0.4 MG CAPS capsule TAKE 1 CAPSULE BY MOUTH  DAILY (Patient taking differently: Take 0.4 mg by mouth in the morning.) 90 capsule 3   No current facility-administered medications on file prior to visit.   Allergies  Allergen Reactions   Bee Venom Anaphylaxis   Family History  Problem Relation Age of Onset   Drug abuse Other    Cancer Other    Heart disease Other    Lung disease Other    Diabetes Mother    Diabetes Sister    Colon cancer Neg Hx    Stomach cancer Neg Hx     PE: BP 120/78 (BP Location: Right Arm, Patient Position: Sitting, Cuff Size: Normal)   Pulse 67   Ht 5\' 11"  (1.803 m)   Wt 258 lb 3.2 oz (117.1 kg)   SpO2 97%   BMI 36.01 kg/m  Wt Readings from Last 3 Encounters:  08/20/21 258 lb 3.2 oz (117.1 kg)  08/08/21 250 lb (113.4 kg)  05/23/21 255 lb (115.7 kg)   Constitutional: overweight, in NAD Eyes: PERRLA, EOMI, no exophthalmos ENT: moist mucous membranes, no thyromegaly, no cervical lymphadenopathy Cardiovascular: RRR, No MRG, +R>L LE swelling Respiratory: CTA B Musculoskeletal: no deformities, strength intact in all 4 Skin: moist, warm, no rashes Neurological: no tremor with outstretched hands, DTR normal in all 4  ASSESSMENT: 1. DM2, insulin-dependent but not on insulin, uncontrolled, with complications - CAD - s/p NSTEMI (Dr. Terrence Dupont) - peripheral neuropathy - CKD  2. B12 def  3.  Peripheral neuropathy  4. HL  PLAN:  1. Patient with longstanding, uncontrolled, type 2 diabetes, with improved control of earlier in 2021, but worsening control earlier this year, after he relaxed his diet and started on Augmentin.  We tried to obtain a GLP-1 receptor agonist for him but this was not affordable despite a referral to the [facility].  He did start to improve his diet and  added back exercise and HbA1c improved and he was even better at last visit, at 6.9%.  At last visit sugars are mostly at goal in the morning but with occasional spikes when he had dietary discretions the night before (pizza).  We did not change his regimen at that time.  I did advise him to check some sugars later in the day, also. -At today's visit, sugars are after slightly higher than target in the morning with some occasional spike up to 200 if he forgot medication at night.  Also, upon questioning, he is not taking glipizide 15 to 30 minutes before a meal, but usually after the meal.  We discussed about moving this before the meal but then if sugars are not improving, to increase the dose to 10 mg before dinner.  For now,  we will continue the same dose of metformin.  We again discussed about possibly starting an SGLT2 inhibitor.  From Eliquis, he gets into the donut hole relatively fast so he does not think he could afford another expensive medication.  I advised him to still check with his insurance after the first of the year, since he has both heart disease and chronic kidney disease, which would benefit from adding this class of medication. - I advised him to: Patient Instructions  Please continue: - Metformin 1000 mg 2x a day with meals  Try to increase: - Glipizide 5 mg in am and 10 mg before dinner  Please check some sugars later in the day, also.  After the first of the year, check with your insurance if they cover: Noni Saupe, [place].  Please return in 3-4 months with your sugar log.  - we checked his HbA1c: 7.5% (higher) - advised to check sugars at different times of the day - 1x a day, rotating check times - advised for yearly eye exams >> he is UTD - return to clinic in 3-4 months    2.  And 3. Peripheral neuropathy and B12 deficiency -His peripheral neuropathy is probably related to both diabetes and B12 deficiency -At last visit he was on 5000 mcg  every 3 days and I advised him to switch to 1000 mcg daily after he ran out of the high-dose tablet  -Currently he is still on 5000 mcg every 3 days -The vitamin B12 level was at goal in 09/2020 -We will repeat this at next visit  4. HL -Reviewed latest lipid panel from 09/2020: All fractions at goal: Lab Results  Component Value Date   CHOL 121 10/06/2020   HDL 46.20 10/06/2020   LDLCALC 56 10/06/2020   LDLDIRECT 139.8 07/08/2014   TRIG 92.0 10/06/2020   CHOLHDL 3 10/06/2020  -He continues on Lipitor 40 mg daily without side effects  Philemon Kingdom, MD PhD [facility]

## 2021-08-21 ENCOUNTER — Telehealth: Payer: Self-pay | Admitting: Pharmacist

## 2021-08-21 NOTE — Chronic Care Management (AMB) (Signed)
Chronic Care Management Pharmacy Assistant   Name: Charles [name]  MRN: [id number] DOB: 1941-08-03  Reason for Encounter: Disease State / Diabetes Assessment Call   Conditions to be addressed/monitored: DMII  Recent office visits:  None  Recent consult visits:  08/20/2021 Layla Maw MD (internal medicine) - Patient was seen for poorly controlled type 2 diabetes and additional issues. No medication changes. Follow up in 4 months.  08/08/2021 Hulan Saas DO (sports medicine) - Patient was seen for acquired trigger finger of right index finger and additional issue. No medication changes. No follow up noted.  05/23/2021 Hulan Saas DO (sports medicine) - Patient was seen for acquired trigger finger of right index finger and additional issue. No medication changes. No follow up 2-3 months.  Hospital visits:  None  Medications: Outpatient Encounter Medications as of 08/21/2021  Medication Sig   aspirin 81 MG chewable tablet Chew 81 mg by mouth in the morning.   atorvastatin (LIPITOR) 40 MG tablet Take 1 tablet (40 mg total) by mouth daily at 6 PM.   Cholecalciferol (VITAMIN D-3 PO) Take 5,000 Units by mouth in the morning.   Cyanocobalamin (VITAMIN B-12) 5000 MCG SUBL Place 5,000 mcg under the tongue every 3 (three) days.   ELIQUIS 5 MG TABS tablet Take 5 mg by mouth 2 (two) times daily.   EPINEPHrine (EPI-PEN) 0.3 mg/0.3 mL DEVI Inject 0.3 mg into the muscle daily as needed (allergic reaction).   gabapentin (NEURONTIN) 100 MG capsule TAKE 1 CAPSULE BY MOUTH AT  BEDTIME (Patient taking differently: Take 100 mg by mouth at bedtime.)   glipiZIDE (GLUCOTROL) 5 MG tablet TAKE 1 TABLET BY MOUTH 2  TIMES DAILY BEFORE A MEAL (Patient taking differently: Take 5 mg by mouth 2 (two) times daily before a meal.)   Insulin Syringes, Disposable, U-100 0.5 ML MISC Use 1x a day   lisinopril (ZESTRIL) 20 MG tablet Take 20 mg by mouth in the morning.   metFORMIN (GLUCOPHAGE) 1000 MG tablet  TAKE 1 TABLET BY MOUTH  TWICE DAILY WITH MEALS   metoprolol succinate (TOPROL-XL) 100 MG 24 hr tablet Take 50-100 mg by mouth See admin instructions. Take 1 tablet (100 mg) gy mouth in the morning & take 0.5 tablet (50 mg) by mouth in the evening.   Multiple Vitamins-Minerals (PRESERVISION AREDS 2 PO) Take 1 tablet by mouth in the morning and at bedtime.   nitroGLYCERIN (NITROSTAT) 0.4 MG SL tablet Place 1 tablet (0.4 mg total) under the tongue every 5 (five) minutes as needed for chest pain.   ONETOUCH DELICA LANCETS FINE MISC Use to check sugar daily   ONETOUCH VERIO test strip USE 1 STRIP TO CHECK GLUCOSE ONCE DAILY   spironolactone (ALDACTONE) 25 MG tablet Take 25 mg by mouth in the morning.   tamsulosin (FLOMAX) 0.4 MG CAPS capsule TAKE 1 CAPSULE BY MOUTH  DAILY (Patient taking differently: Take 0.4 mg by mouth in the morning.)   No facility-administered encounter medications on file as of 08/21/2021.  Fill History: ELIQUIS  5 MG TABS 05/01/2021 90   ATORVASTATIN CALCIUM  40 MG TABS 04/19/2021 90   GABAPENTIN  100 MG CAPS 04/19/2021 90   LISINOPRIL  20 MG TABS 04/27/2021 90   SPIRONOLACTONE  25 MG TABS 04/17/2021 90   GLIPIZIDE  5 MG TABS 04/27/2021 90   Recent Relevant Labs: Lab Results  Component Value Date/Time   HGBA1C 7.5 (A) 08/20/2021 10:01 AM   HGBA1C 6.9 (A) 05/01/2021 11:47 AM  HGBA1C 7.9 (H) 09/29/2018 02:23 AM   HGBA1C 6.9 (H) 08/08/2016 08:56 AM   MICROALBUR 13.8 (H) 08/08/2016 08:56 AM   MICROALBUR 11.4 (H) 07/21/2015 08:37 AM    Kidney Function Lab Results  Component Value Date/Time   CREATININE 1.13 04/06/2021 03:50 PM   CREATININE 1.08 01/08/2021 08:00 AM   CREATININE 1.08 10/25/2020 10:19 AM   CREATININE 1.21 (H) 02/18/2017 08:30 AM   GFR 65.23 10/25/2020 10:19 AM   GFRNONAA >60 01/08/2021 08:00 AM   GFRNONAA 58 (L) 02/18/2017 08:30 AM   GFRAA 53 (L) 09/29/2018 10:14 AM   GFRAA 67 02/18/2017 08:30 AM    Current antihyperglycemic regimen:   Metformin 1000 mg twice daily Glipzide 5 mg twice daily  What recent interventions/DTPs have been made to improve glycemic control:  Patient states no changes have been made.  Have there been any recent hospitalizations or ED visits since last visit with [facility]? No  Patient reports hypoglycemic symptoms, including Sweaty and Shaky, patient states this doesn't happen very often.  Patient reports hyperglycemic symptoms, patient states he doesn't have any symptoms, his blood sugar reading will be elevated when he forgets his pill the night before.  How often are you checking your blood sugar? Patient is checking his blood sugar daily in the morning fasting and occasionally more than once daily.  What are your blood sugars ranging?  Fasting: Patient states his meter has his average for the past 7 days as 139  During the week, how often does your blood glucose drop below 70?  Patient states this happens on an extremely rare  occasion.  Are you checking your feet daily/regularly? Patient is checking his feet daily.  Adherence Review: Is the patient currently on a STATIN medication? Yes Is the patient currently on ACE/ARB medication? Yes Does the patient have >5 day gap between last estimated fill dates? No  Care Gaps: AWV - Scheduled for 12/26/2021 Foot Exam  - overdue since 08/23/2018 Ophthalmology Exam - overdue since 01/02/2021 Flu vaccine - due  Star Rating Drugs: Atorvastatin 40mg  - last filled 07/13/2021 90DS at [facility] Glipizide 5mg  - last filled 07/23/2021 90DS at [facility] Lisinopril 20mg  - last filled 07/23/2021 90DS at [facility] Metformin 1000mg  - last filled 05/30/2021 90[facility] at [facility] with [facility] [place] [phone]

## 2021-08-24 ENCOUNTER — Ambulatory Visit (INDEPENDENT_AMBULATORY_CARE_PROVIDER_SITE_OTHER): Payer: Medicare Other

## 2021-08-24 ENCOUNTER — Other Ambulatory Visit: Payer: Self-pay | Admitting: Internal Medicine

## 2021-08-24 DIAGNOSIS — I10 Essential (primary) hypertension: Secondary | ICD-10-CM

## 2021-08-24 DIAGNOSIS — E785 Hyperlipidemia, unspecified: Secondary | ICD-10-CM

## 2021-08-24 DIAGNOSIS — E1165 Type 2 diabetes mellitus with hyperglycemia: Secondary | ICD-10-CM

## 2021-08-24 DIAGNOSIS — E1159 Type 2 diabetes mellitus with other circulatory complications: Secondary | ICD-10-CM

## 2021-08-24 DIAGNOSIS — I4891 Unspecified atrial fibrillation: Secondary | ICD-10-CM

## 2021-08-24 NOTE — Chronic Care Management (AMB) (Signed)
Chronic Care Management   CCM RN Visit Note  08/24/2021 Name: Charles [name] MRN: [id number] DOB: [birth year]  Subjective: Charles [name] is a 80 y.o. year old male who is a primary care patient of [name], NP. The care management team was consulted for assistance with disease management and care coordination needs.    Engaged with patient by telephone for follow up visit in response to provider referral for case management and/or care coordination services.   Consent to Services:  The patient was given information about Chronic Care Management services, agreed to services, and gave verbal consent prior to initiation of services.  Please see initial visit note for detailed documentation.   Patient agreed to services and verbal consent obtained.   Assessment: Review of patient past medical history, allergies, medications, health status, including review of consultants reports, laboratory and other test data, was performed as part of comprehensive evaluation and provision of chronic care management services.   SDOH (Social Determinants of Health) assessments and interventions performed:    CCM Care Plan  Allergies  Allergen Reactions   Bee Venom Anaphylaxis    Outpatient Encounter Medications as of 08/24/2021  Medication Sig   aspirin 81 MG chewable tablet Chew 81 mg by mouth in the morning.   atorvastatin (LIPITOR) 40 MG tablet Take 1 tablet (40 mg total) by mouth daily at 6 PM.   Cholecalciferol (VITAMIN D-3 PO) Take 5,000 Units by mouth in the morning.   Cyanocobalamin (VITAMIN B-12) 5000 MCG SUBL Place 5,000 mcg under the tongue every 3 (three) days.   ELIQUIS 5 MG TABS tablet Take 5 mg by mouth 2 (two) times daily.   EPINEPHrine (EPI-PEN) 0.3 mg/0.3 mL DEVI Inject 0.3 mg into the muscle daily as needed (allergic reaction).   gabapentin (NEURONTIN) 100 MG capsule TAKE 1 CAPSULE BY MOUTH AT  BEDTIME (Patient taking differently: Take 100 mg by mouth at bedtime.)   glipiZIDE  (GLUCOTROL) 5 MG tablet TAKE 1 TABLET BY MOUTH 2  TIMES DAILY BEFORE A MEAL (Patient taking differently: Take 5 mg by mouth 2 (two) times daily before a meal.)   Insulin Syringes, Disposable, U-100 0.5 ML MISC Use 1x a day   lisinopril (ZESTRIL) 20 MG tablet Take 20 mg by mouth in the morning.   metFORMIN (GLUCOPHAGE) 1000 MG tablet TAKE 1 TABLET BY MOUTH  TWICE DAILY WITH MEALS   metoprolol succinate (TOPROL-XL) 100 MG 24 hr tablet Take 50-100 mg by mouth See admin instructions. Take 1 tablet (100 mg) gy mouth in the morning & take 0.5 tablet (50 mg) by mouth in the evening.   Multiple Vitamins-Minerals (PRESERVISION AREDS 2 PO) Take 1 tablet by mouth in the morning and at bedtime.   nitroGLYCERIN (NITROSTAT) 0.4 MG SL tablet Place 1 tablet (0.4 mg total) under the tongue every 5 (five) minutes as needed for chest pain.   ONETOUCH DELICA LANCETS FINE MISC Use to check sugar daily   ONETOUCH VERIO test strip USE 1 STRIP TO CHECK GLUCOSE ONCE DAILY   spironolactone (ALDACTONE) 25 MG tablet Take 25 mg by mouth in the morning.   tamsulosin (FLOMAX) 0.4 MG CAPS capsule TAKE 1 CAPSULE BY MOUTH  DAILY (Patient taking differently: Take 0.4 mg by mouth in the morning.)   No facility-administered encounter medications on file as of 08/24/2021.    Patient Active Problem List   Diagnosis Date Noted   Acquired trigger finger of right index finger 05/23/2021   Cellulitis 03/22/2021   Neck  mass 01/08/2021   Atrial fibrillation ([place]) 10/25/2020   Parotid mass 10/25/2020   Epistaxis 09/29/2018   Hyperkalemia 09/29/2018   AKI (acute kidney injury) ([place]) 09/29/2018   Lumbar radiculopathy 08/06/2017   Greater trochanteric bursitis of left hip 07/04/2017   Rotator cuff arthropathy of left shoulder 01/24/2017   AC (acromioclavicular) arthritis 01/24/2017   Vitamin B12 deficiency 01/18/2016   NSTEMI (non-ST elevated myocardial infarction) ([place]) 10/19/2015   Acute non Q wave MI (myocardial infarction),  initial episode of care ([place]) 10/19/2015   Peripheral neuropathy 10/13/2015   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   Actinic keratosis of right cheek 10/04/2014   Contact dermatitis and eczema 02/10/2014   Secondary renovascular hypertension, 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   Acute blood loss anemia 08/27/2013   Insomnia 08/27/2013   Hip fracture, right ([place]) 07/25/2013   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   Renal cell carcinoma ([place]) 07/25/2013   History of colonic polyps 07/20/2012   Hyperlipidemia 11/13/2007   Obesity 11/13/2007   Essential hypertension 04/23/2007    Conditions to be addressed/monitored:Atrial Fibrillation, HTN, HLD, and DMII  Care Plan : RNCM:Atrial Fibrillation (Adult)  Updates made by Dimitri Ped, RN since 08/24/2021 12:00 AM  Completed 08/24/2021   Problem: Lack of self mangement plan for Atrial Fibrillation Resolved 08/24/2021  Priority: High     Long-Range Goal: RNCM: Develolp and self manage atrial fibrillation Completed 08/24/2021  Start Date: 10/18/2020  Expected End Date: 12/08/2021  Recent Progress: On track  Priority: High  Note:   Resolving due to duplicate goal  Current Barriers:  Knowledge Deficits related to new dx of atrial fibrillation with hx of HTN and HLD -Reports no atrial fibrillation symptoms. Denies any shortness of breath,chest pain or swelling.  Reports taking Eliquis as directed.  States he saw Dr. Terrence Dupont in September and he did not change any of his medications. States he is to see him again in January. Denies any swelling and his weight has been stable.  States his B/P usually good when he checks it at the [facility].    Chronic Disease Management support and education needs related to Atrial fibrillation Nurse Case Manager Clinical Goal(s):  Over the next 120 days, patient will verbalize understanding of plan for self management of atrial fibrillation Over the next 120 days, patient will verbalize basic  understanding of atrial fibrillation disease process and self health management plan as evidenced by decrease in symptoms, adherence to medications, keeping provider appts  Interventions:  1:1 collaboration with [name], NP regarding development and update of comprehensive plan of care as evidenced by provider attestation and co-signature Inter-disciplinary care team collaboration (see longitudinal plan of care)  Reinforced patient to call provider for worsening symptoms of atrial fibrillation  Reinforced education to patient re: atrial fibrillation disease process and atrial fibrillation action plan  Reviewed medications with patient and discussed importance of taking medications as ordered to lower heart rate and taking anticoagulant Reviewed scheduled/upcoming provider appointments including: Dr. Terrence Dupont cardiology January 2023, Dr. Cruzita Lederer 08/20/21 Discussed plans with patient for ongoing care management follow up and provided patient with direct contact information for care management team Reinforced to keep a log of his weights and B/P to take to his appt with Dr.Harwani Patient Goals/Self-Care Activities Patient will attend all scheduled provider appointments Patient will call provider office for new concerns or questions - check pulse (heart) rate once a day - cut down alcohol use - make a plan to exercise regularly - make  a plan to eat healthy - take medicine as prescribed -review atrial fibrillation action plan -take anticoagulant to prevent stroke -take log of B/P and weights to cardiology visit Follow Up Plan: Telephone follow up appointment with care management team member scheduled for: 08/24/21 at 9 AM The patient has been provided with contact information for the care management team and has been advised to call with any health related questions or concerns.          Care Plan : RNCM:Diabetes Type 2 (Adult)  Updates made by Dimitri Ped, RN since 08/24/2021  12:00 AM  Completed 08/24/2021   Problem: RNCM: Lack of effective self management of Diabetes Type 2 Resolved 08/24/2021  Priority: Medium     Long-Range Goal: Effective self management of blood sugars and Diabetes Type 2 Completed 08/24/2021  Start Date: 10/18/2020  Expected End Date: 12/08/2021  Recent Progress: On track  Priority: Medium  Note:   Resolving due to duplicate goal  Objective:  Lab Results  Component Value Date   HGBA1C 7.9 (A) 08/21/2020   Lab Results  Component Value Date   CREATININE 1.06 10/06/2020   CREATININE 1.02 10/08/2019   CREATININE 1.21 10/26/2018  Current Barriers:  Knowledge Deficits related to basic Diabetes pathophysiology and self care/management with hx of HTN and atrial fib.  Does not adhere to provider recommendations re: Low CHO diet and portion control. States his fasting readings range from 105-160.  States he only has the higher readings once or twice a week with most readings in the low 100's.  States he can not identify anything different on the days his CBG is higher.  States his diet and exercise are the same  States he was checking his after meal readings a few times but has not checked much recently. Denies any hypoglycemia.  States he is watching portions better and avoiding second helpings.  States he had been eating more vegetables in the summer.  States he is still going to gym 5 days a week walking 1-2 miles and working on IT trainer.  States he has had an issue with his rt hand and saw sports medicine for his trigger finger.  States he might need a cortisone shot in his hand Case Manager Clinical Goal(s):  patient will demonstrate improved adherence to prescribed treatment plan for diabetes self care/management as evidenced by: daily monitoring and recording of CBG  adherence to ADA/ carb modified diet exercise 3 days/week adherence to prescribed medication regimen Interventions:  Collaboration with [name], NP  regarding development and update of comprehensive plan of care as evidenced by provider attestation and co-signature Inter-disciplinary care team collaboration (see longitudinal plan of care) Reinforced education to patient about basic DM disease process Reviewed medications with patient and discussed importance of medication adherence Discussed plans with patient for ongoing care management follow up and provided patient with direct contact information for care management team Reviewed scheduled/upcoming provider appointments including: Dr. Terrence Dupont cardiology January 2023, Dr. Cruzita Lederer endocrinologist 08/20/21, Dr. Tamala Julian sports med 08/08/21 Reinforced  to check cbg daily at varying times and record, calling provider for findings outside established parameters.   Reinforced importance of getting regular exercise with resistance training at least 2 times a week Reinforced the rule of 15 for hypoglycemia and discussed sources of fast acting sugar to have on hand Reviewed how cortisone shot can increase his blood sugar temporarily  Patient Goals/Self-Care Activities Over the next 90 days, patient will:  - Self administers oral medications as prescribed  Attends all scheduled provider appointments Checks blood sugars as prescribed and utilize hyper and hypoglycemia protocol as needed Adheres to prescribed ADA/carb modified - check blood sugar at prescribed times try checking 1 1/2-2 hours after eating -try checking blood sugar after eating at different times -recheck blood sugar if you get an unexpected number - check blood sugar before and after exercise - check blood sugar if I feel it is too high or too low - enter blood sugar readings and medication or insulin into daily log - take the blood sugar log to all doctor visits - take the blood sugar meter to all doctor visits  -keep glucose tablets or some other fast acting sugar on hand to treat low blood sugars Follow Up Plan: Telephone follow up  appointment with care management team member scheduled for: 08/24/21 at 9 AM The patient has been provided with contact information for the care management team and has been advised to call with any health related questions or concerns.      Care Plan : RN Care Manager Plan of Care  Updates made by Dimitri Ped, RN since 08/24/2021 12:00 AM     Problem: Chronic Disease Management and Care Coordination Needs (DM. Afib,HTN and HLD)   Priority: High     Long-Range Goal: Establish Plan of Care for Chronic Disease Management Needs (DM. Afib,HTN and HLD)   Start Date: 08/24/2021  Expected End Date: 02/20/2022  Priority: High  Note:   Current Barriers:  Knowledge Deficits related to plan of care for management of Atrial Fibrillation, HTN, HLD, and DMII  Chronic Disease Management support and education needs related to Atrial Fibrillation, HTN, HLD, and DMII  States his fasting readings range from 98-152 and after meals 150-160.  States he saw Dr.Gherghe 08/20/21 and she increased his glipizide to 5 mg with breakfast and 10 mg at supper.  States he sometimes forgets his evening medications even though he uses a pill box. Denies any recent hypoglycemia but he will feel the low 90's and high 80's.  States he might have been eating more sweets for the holidays and has pizza sometimes  States he is still going to gym 5 days a week walking 1-2 miles and working on IT trainer. Deneis any chest pains, shortness of breath or swelling.  States his B[facility]P is [facility].  States he is to see cardiology mid January  RNCM Clinical Goal(s):  Patient will verbalize understanding of plan for management of Atrial Fibrillation, HTN, HLD, and DMII as evidenced by voiced adherence to plan of care verbalize basic understanding of  Atrial Fibrillation, HTN, HLD, and DMII disease process and self health management plan as evidenced by voiced understanding and teach back take all  medications exactly as prescribed and will call provider for medication related questions as evidenced by dispense report and pt verbalization attend all scheduled medical appointments: [name] NP 10/09/21, endocrinology 12/19/21, cardiology January as evidenced by medical records demonstrate Improved adherence to prescribed treatment plan for Atrial Fibrillation, HTN, HLD, and DMII as evidenced by readings within limits, voiced adherence to plan of care continue to work with RN Care Manager to address care management and care coordination needs related to  Atrial Fibrillation, HTN, HLD, and DMII as evidenced by adherence to CM Team Scheduled appointments through collaboration with RN Care manager, provider, and care team.   Interventions: 1:1 collaboration with primary care provider regarding development and update of comprehensive plan of care as  evidenced by provider attestation and co-signature Inter-disciplinary care team collaboration (see longitudinal plan of care) Evaluation of current treatment plan related to  self management and patient's adherence to plan as established by provider   AFIB Interventions: (Status:  Goal on track:  Yes.) Long Term Goal   Counseled on increased risk of stroke due to Afib and benefits of anticoagulation for stroke prevention Reviewed importance of adherence to anticoagulant exactly as prescribed Counseled on bleeding risk associated with Eliquis and importance of self-monitoring for signs/symptoms of bleeding Counseled on seeking medical attention after a head injury or if there is blood in the urine/stool   Diabetes Interventions:  (Status:  Goal on track:  Yes.) Long Term Goal Assessed patient's understanding of A1c goal: <7% Provided education to patient about basic DM disease process Reviewed medications with patient and discussed importance of medication adherence Counseled on importance of regular laboratory monitoring as prescribed Discussed  plans with patient for ongoing care management follow up and provided patient with direct contact information for care management team Provided patient with written educational materials related to hypo and hyperglycemia and importance of correct treatment Advised patient, providing education and rationale, to check cbg daily at different times and record, calling provider for findings outside established parameters Review of patient status, including review of consultants reports, relevant laboratory and other test results, and medications completed Reviewed to take glipizide 15-30 minutes before eating and to not miss any evening dose of medications.  Encouraged to set alarm on phone to remind him to take medications Lab Results  Component Value Date   HGBA1C 7.5 (A) 08/20/2021   Hyperlipidemia Interventions:  (Status:  Goal on track:  Yes.) Long Term Goal Medication review performed; medication list updated in electronic medical record.  Provider established cholesterol goals reviewed Counseled on importance of regular laboratory monitoring as prescribed Reviewed role and benefits of statin for ASCVD risk reduction Reviewed importance of limiting foods high in cholesterol Reviewed exercise goals and target of 150 minutes per week  Hypertension Interventions:  (Status:  Goal on track:  Yes.) Long Term Goal Last practice recorded BP readings:  BP Readings from Last 3 Encounters:  08/20/21 120/78  08/08/21 132/80  05/23/21 110/80  Most recent eGFR/CrCl: No results found for: EGFR  No components found for: CRCL  Evaluation of current treatment plan related to hypertension self management and patient's adherence to plan as established by provider Provided education to patient re: stroke prevention, s/s of heart attack and stroke Counseled on the importance of exercise goals with target of 150 minutes per week Discussed plans with patient for ongoing care management follow up and provided  patient with direct contact information for care management team Advised patient, providing education and rationale, to monitor blood pressure daily and record, calling PCP for findings outside established parameters Provided education on prescribed diet low sodium low CHO  Patient Goals/Self-Care Activities: Take all medications as prescribed Attend all scheduled provider appointments Call pharmacy for medication refills 3-7 days in advance of running out of medications Perform all self care activities independently  Perform IADL's (shopping, preparing meals, housekeeping, managing finances) independently Call provider office for new concerns or questions  keep appointment with eye doctor check blood sugar at prescribed times: once daily and when you have symptoms of low or high blood sugar check feet daily for cuts, sores or redness take the blood sugar log to all doctor visits drink 6 to 8 glasses of water each day fill half of plate  with vegetables limit fast food meals to no more than 1 per week manage portion size wash and dry feet carefully every day check pulse (heart) rate once a day make a plan to exercise regularly make a plan to eat healthy take medicine as prescribed check blood pressure 3 times per week choose a place to take my blood pressure (home, clinic or office, retail store) take blood pressure log to all doctor appointments keep all doctor appointments eat more whole grains, fruits and vegetables, lean meats and healthy fats call for medicine refill 2 or 3 days before it runs out take all medications exactly as prescribed call doctor with any symptoms you believe are related to your medicine  Follow Up Plan:  Telephone follow up appointment with care management team member scheduled for:  10/26/21 The patient has been provided with contact information for the care management team and has been advised to call with any health related questions or concerns.        Plan:Telephone follow up appointment with care management team member scheduled for:  10/26/21 The patient has been provided with contact information for the care management team and has been advised to call with any health related questions or concerns.  Peter Garter RN, Jackquline Denmark, CDE Care Management Coordinator [facility] [phone], Mobile [phone]

## 2021-08-24 NOTE — Patient Instructions (Signed)
Visit Information  Thank you for taking time to visit with me today. Please don't hesitate to contact me if I can be of assistance to you before our next scheduled telephone appointment.  Following are the goals we discussed today:  Take all medications as prescribed Attend all scheduled provider appointments Call pharmacy for medication refills 3-7 days in advance of running out of medications Perform all self care activities independently  Perform IADL's (shopping, preparing meals, housekeeping, managing finances) independently Call provider office for new concerns or questions  keep appointment with eye doctor check blood sugar at prescribed times: once daily and when you have symptoms of low or high blood sugar check feet daily for cuts, sores or redness take the blood sugar log to all doctor visits drink 6 to 8 glasses of water each day fill half of plate with vegetables limit fast food meals to no more than 1 per week manage portion size wash and dry feet carefully every day check pulse (heart) rate once a day make a plan to exercise regularly make a plan to eat healthy take medicine as prescribed check blood pressure 3 times per week choose a place to take my blood pressure (home, clinic or office, retail store) take blood pressure log to all doctor appointments keep all doctor appointments eat more whole grains, fruits and vegetables, lean meats and healthy fats call for medicine refill 2 or 3 days before it runs out take all medications exactly as prescribed call doctor with any symptoms you believe are related to your medicine  Our next appointment is by telephone on 10/26/21 at 9 AM  Please call the care guide team at [phone] if you need to cancel or reschedule your appointment.   If you are experiencing a Mental Health or [place] or need someone to talk to, please call the Suicide and Crisis Lifeline: 988 call 1-800-273-TALK (toll free, 24 hour  hotline) go to [facility] [address] [place] [phone]) call 911   Patient verbalizes understanding of instructions provided today and agrees to view in [place].  Peter Garter RN, Jackquline Denmark, CDE Care Management Coordinator [facility] [phone], Mobile [phone]

## 2021-09-04 ENCOUNTER — Telehealth: Payer: Self-pay | Admitting: Pharmacist

## 2021-09-04 NOTE — Chronic Care Management (AMB) (Signed)
° ° °  Chronic Care Management Pharmacy Assistant   Name: PAMELA MADDY  MRN: [id number] DOB: 24-Oct-1940  Reason for Encounter: Reschedule appointment Spoke with patient, rescheduled appointment with Jeni Salles to 11/26/2021.   Care Gaps: AWV - Scheduled for 12/26/2021 Last BP - 120/78 on 08/20/2021 Foot Exam  - overdue since 08/23/2018 Ophthalmology Exam - overdue since 01/02/2021 Flu vaccine - due  Star Rating Drugs: Atorvastatin 40mg  - last filled 07/13/2021 90DS at [facility] Glipizide 5mg  - last filled 07/23/2021 90[facility] at [facility] Lisinopril 20mg  - last filled 07/23/2021 90DS at [facility] Metformin 1000mg  - last filled 08/23/2021 90DS at [facility] Pharmacist Assistant [phone]

## 2021-09-07 ENCOUNTER — Other Ambulatory Visit: Payer: Self-pay | Admitting: Internal Medicine

## 2021-09-07 DIAGNOSIS — E1165 Type 2 diabetes mellitus with hyperglycemia: Secondary | ICD-10-CM

## 2021-09-08 DIAGNOSIS — E1159 Type 2 diabetes mellitus with other circulatory complications: Secondary | ICD-10-CM | POA: Diagnosis not present

## 2021-09-08 DIAGNOSIS — I10 Essential (primary) hypertension: Secondary | ICD-10-CM

## 2021-09-08 DIAGNOSIS — E785 Hyperlipidemia, unspecified: Secondary | ICD-10-CM | POA: Diagnosis not present

## 2021-09-08 DIAGNOSIS — E1165 Type 2 diabetes mellitus with hyperglycemia: Secondary | ICD-10-CM

## 2021-09-08 DIAGNOSIS — I4891 Unspecified atrial fibrillation: Secondary | ICD-10-CM

## 2021-09-14 ENCOUNTER — Other Ambulatory Visit: Payer: Self-pay | Admitting: Internal Medicine

## 2021-09-14 DIAGNOSIS — E538 Deficiency of other specified B group vitamins: Secondary | ICD-10-CM

## 2021-09-14 DIAGNOSIS — N182 Chronic kidney disease, stage 2 (mild): Secondary | ICD-10-CM

## 2021-09-14 DIAGNOSIS — E1122 Type 2 diabetes mellitus with diabetic chronic kidney disease: Secondary | ICD-10-CM

## 2021-09-15 ENCOUNTER — Other Ambulatory Visit: Payer: Self-pay | Admitting: Adult Health

## 2021-09-15 DIAGNOSIS — N401 Enlarged prostate with lower urinary tract symptoms: Secondary | ICD-10-CM

## 2021-09-26 DIAGNOSIS — E559 Vitamin D deficiency, unspecified: Secondary | ICD-10-CM | POA: Diagnosis not present

## 2021-09-26 DIAGNOSIS — I480 Paroxysmal atrial fibrillation: Secondary | ICD-10-CM | POA: Diagnosis not present

## 2021-09-26 DIAGNOSIS — I2510 Atherosclerotic heart disease of native coronary artery without angina pectoris: Secondary | ICD-10-CM | POA: Diagnosis not present

## 2021-09-26 DIAGNOSIS — I10 Essential (primary) hypertension: Secondary | ICD-10-CM | POA: Diagnosis not present

## 2021-09-26 DIAGNOSIS — E1169 Type 2 diabetes mellitus with other specified complication: Secondary | ICD-10-CM | POA: Diagnosis not present

## 2021-10-04 DIAGNOSIS — H353133 Nonexudative age-related macular degeneration, bilateral, advanced atrophic without subfoveal involvement: Secondary | ICD-10-CM | POA: Diagnosis not present

## 2021-10-04 DIAGNOSIS — H35372 Puckering of macula, left eye: Secondary | ICD-10-CM | POA: Diagnosis not present

## 2021-10-04 DIAGNOSIS — H34811 Central retinal vein occlusion, right eye, with macular edema: Secondary | ICD-10-CM | POA: Diagnosis not present

## 2021-10-09 ENCOUNTER — Encounter: Payer: Self-pay | Admitting: Adult Health

## 2021-10-09 ENCOUNTER — Ambulatory Visit (INDEPENDENT_AMBULATORY_CARE_PROVIDER_SITE_OTHER): Payer: Medicare Other | Admitting: Adult Health

## 2021-10-09 VITALS — BP 132/80 | HR 57 | Temp 98.5°F | Ht 71.0 in | Wt 255.0 lb

## 2021-10-09 DIAGNOSIS — I214 Non-ST elevation (NSTEMI) myocardial infarction: Secondary | ICD-10-CM | POA: Diagnosis not present

## 2021-10-09 DIAGNOSIS — E1159 Type 2 diabetes mellitus with other circulatory complications: Secondary | ICD-10-CM | POA: Diagnosis not present

## 2021-10-09 DIAGNOSIS — E538 Deficiency of other specified B group vitamins: Secondary | ICD-10-CM | POA: Diagnosis not present

## 2021-10-09 DIAGNOSIS — N401 Enlarged prostate with lower urinary tract symptoms: Secondary | ICD-10-CM

## 2021-10-09 DIAGNOSIS — E1165 Type 2 diabetes mellitus with hyperglycemia: Secondary | ICD-10-CM

## 2021-10-09 DIAGNOSIS — I4891 Unspecified atrial fibrillation: Secondary | ICD-10-CM | POA: Diagnosis not present

## 2021-10-09 DIAGNOSIS — E559 Vitamin D deficiency, unspecified: Secondary | ICD-10-CM

## 2021-10-09 DIAGNOSIS — I10 Essential (primary) hypertension: Secondary | ICD-10-CM

## 2021-10-09 DIAGNOSIS — R3912 Poor urinary stream: Secondary | ICD-10-CM

## 2021-10-09 DIAGNOSIS — Z0000 Encounter for general adult medical examination without abnormal findings: Secondary | ICD-10-CM

## 2021-10-09 DIAGNOSIS — E785 Hyperlipidemia, unspecified: Secondary | ICD-10-CM

## 2021-10-09 LAB — CBC WITH DIFFERENTIAL/PLATELET
Basophils Absolute: 0.1 10*3/uL (ref 0.0–0.1)
Basophils Relative: 1 % (ref 0.0–3.0)
Eosinophils Absolute: 0.3 10*3/uL (ref 0.0–0.7)
Eosinophils Relative: 4.9 % (ref 0.0–5.0)
HCT: 43.3 % (ref 39.0–52.0)
Hemoglobin: 14.4 g/dL (ref 13.0–17.0)
Lymphocytes Relative: 18.7 % (ref 12.0–46.0)
Lymphs Abs: 1.1 10*3/uL (ref 0.7–4.0)
MCHC: 33.2 g/dL (ref 30.0–36.0)
MCV: 94.6 fl (ref 78.0–100.0)
Monocytes Absolute: 0.7 10*3/uL (ref 0.1–1.0)
Monocytes Relative: 10.7 % (ref 3.0–12.0)
Neutro Abs: 4 10*3/uL (ref 1.4–7.7)
Neutrophils Relative %: 64.7 % (ref 43.0–77.0)
Platelets: 219 10*3/uL (ref 150.0–400.0)
RBC: 4.58 Mil/uL (ref 4.22–5.81)
RDW: 13.6 % (ref 11.5–15.5)
WBC: 6.1 10*3/uL (ref 4.0–10.5)

## 2021-10-09 LAB — LIPID PANEL
Cholesterol: 148 mg/dL (ref 0–200)
HDL: 45.2 mg/dL (ref >39.00)
LDL Cholesterol: 63 mg/dL (ref 0–99)
NonHDL: 102.39
Total CHOL/HDL Ratio: 3
Triglycerides: 196 mg/dL — ABNORMAL HIGH (ref 0.0–149.0)
VLDL: 39.2 mg/dL (ref 0.0–40.0)

## 2021-10-09 LAB — COMPREHENSIVE METABOLIC PANEL
ALT: 25 U/L (ref 0–53)
AST: 24 U/L (ref 0–37)
Albumin: 4.3 g/dL (ref 3.5–5.2)
Alkaline Phosphatase: 68 U/L (ref 39–117)
BUN: 18 mg/dL (ref 6–23)
CO2: 31 mEq/L (ref 19–32)
Calcium: 9.4 mg/dL (ref 8.4–10.5)
Chloride: 98 mEq/L (ref 96–112)
Creatinine, Ser: 1.22 mg/dL (ref 0.40–1.50)
GFR: 55.98 mL/min — ABNORMAL LOW (ref >60.00)
Glucose, Bld: 187 mg/dL — ABNORMAL HIGH (ref 70–99)
Potassium: 4.9 mEq/L (ref 3.5–5.1)
Sodium: 137 mEq/L (ref 135–145)
Total Bilirubin: 0.7 mg/dL (ref 0.2–1.2)
Total Protein: 7.3 g/dL (ref 6.0–8.3)

## 2021-10-09 LAB — PSA: PSA: 1.31 ng/mL (ref 0.10–4.00)

## 2021-10-09 LAB — VITAMIN B12: Vitamin B-12: 832 pg/mL (ref 211–911)

## 2021-10-09 LAB — TSH: TSH: 3.52 u[IU]/mL (ref 0.35–5.50)

## 2021-10-09 LAB — VITAMIN D 25 HYDROXY (VIT D DEFICIENCY, FRACTURES): VITD: 61.75 ng/mL (ref 30.00–100.00)

## 2021-10-09 NOTE — Progress Notes (Signed)
Subjective:    Patient ID: Charles [name], male    DOB: April 12, 1941, 81 y.o.   MRN: [id number]  HPI Patient presents for yearly preventative medicine examination. He is a pleasant 81 year old male who  has a past medical history of Anemia, Arthritis, AVM (arteriovenous malformation) of colon, Cancer of kidney ([place]) (2000), Coronary artery disease, Dysrhythmia, Eczema, GERD (gastroesophageal reflux disease), Hernia, ventral, History of blood transfusion, Hyperlipidemia, Hypertension, Myocardial infarction ([place]), Neuropathy, Obesity, Personal history of adenomatous colonic polyps (07/20/2012), Pneumonia (1942; 1950s X 1), Type II diabetes mellitus ([place]), and Walking pneumonia (2000's X 1).  A-Fib -rate controlled with metoprolol 100 mg daily.  On chronic anticoagulation with Eliquis 5 mg twice daily  DM -managed by endocrinology.  Currently prescribed glipizide 5 mg in the am and 10 mg in the pm  metformin 1000 mg twice daily.  Lab Results  Component Value Date   HGBA1C 7.5 (A) 08/20/2021    Hyperlipidemia/CAD -prescribed Lipitor 40 mg daily.  He denies myalgia or fatigue.  He is seen by cardiology on a routine basis.  He has not experienced chest pain, shortness of breath, or dyspnea on exertion Lab Results  Component Value Date   CHOL 121 10/06/2020   HDL 46.20 10/06/2020   LDLCALC 56 10/06/2020   LDLDIRECT 139.8 07/08/2014   TRIG 92.0 10/06/2020   CHOLHDL 3 10/06/2020    Hypertension-takes lisinopril 20 mg and Toprol 100 mg daily. BP Readings from Last 3 Encounters:  10/09/21 140/90  08/20/21 120/78  08/08/21 132/80    History of NSTEMI-in 2017.  Had stent placed. No chest pain or shortness of breath  BPH-uses Flomax 0.4 mg.  Symptoms are well controlled  B12 Deficiency -takes 5000 mcg every 3 days.  Also though this controls the numbness and tingling in his lower extremities. Lab Results  Component Value Date   VITAMINB12 900 10/06/2020   Vitamin D Deficiency- Takes  Vitamin D 5000 units daily.   All immunizations and health maintenance protocols were reviewed with the patient and needed orders were placed.  Appropriate screening laboratory values were ordered for the patient including screening of hyperlipidemia, renal function and hepatic function.   Medication reconciliation,  past medical history, social history, problem list and allergies were reviewed in detail with the patient  Goals were established with regard to weight loss, exercise, and  diet in compliance with medications  Wt Readings from Last 3 Encounters:  10/09/21 255 lb (115.7 kg)  08/20/21 258 lb 3.2 oz (117.1 kg)  08/08/21 250 lb (113.4 kg)   Review of Systems  Constitutional: Negative.   HENT: Negative.    Eyes: Negative.   Respiratory: Negative.    Cardiovascular: Negative.   Gastrointestinal: Negative.   Endocrine: Negative.   Genitourinary: Negative.   Musculoskeletal: Negative.   Skin: Negative.   Allergic/Immunologic: Negative.   Neurological: Negative.   Hematological: Negative.   Psychiatric/Behavioral: Negative.    All other systems reviewed and are negative.  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[place]) 2000   left nephrectomy   Coronary artery disease    Dysrhythmia    PAF   Eczema    GERD (gastroesophageal reflux disease)    Hernia, ventral    History of blood transfusion  Hyperlipidemia    Hypertension    Myocardial infarction [facility])    Neuropathy    Obesity    Personal history of adenomatous colonic polyps 07/20/2012   3 + adenomas 2009 07/20/2012 2 diminutive polyps     Pneumonia 1942; 1950s X 1   Type II diabetes mellitus ([place])    type II   Walking pneumonia 2000's X 1    Social History   Socioeconomic History   Marital status: Widowed    Spouse name: Not on file   Number of  children: 3   Years of education: Not on file   Highest education level: Not on file  Occupational History   Occupation: retired  Tobacco Use   Smoking status: Former    Years: 20.00    Types: Cigarettes, Pipe, Cigars    Quit date: 09/09/1968    Years since quitting: 53.1   Smokeless tobacco: Never   Tobacco comments:    smoking cigars- quit in 2006  , quit pipe 2000  Vaping Use   Vaping Use: Never used  Substance and Sexual Activity   Alcohol use: Yes    Alcohol/week: 2.0 standard drinks    Types: 2 Glasses of wine per week    Comment: 2 glasses on Tuesday   Drug use: No   Sexual activity: Never  Other Topics Concern   Not on file  Social History Narrative   Regular exercise: goes to the [facility] and walks   Caffeine use: daily; coffee   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     widowed    Three children, One in [place], one in [place] one in [place] - 2 : youngest daughter lives with him   Social Determinants of Health   Financial Resource Strain: Low Risk    Difficulty of Paying Living Expenses: Not hard at all  Food Insecurity: No Food Insecurity   Worried About Charity fundraiser in the Last Year: Never true   Arboriculturist in the Last Year: Never true  Transportation Needs: No Transportation Needs   Lack of Transportation (Medical): No   Lack of Transportation (Non-Medical): No  Physical Activity: Sufficiently Active   Days of Exercise per Week: 5 days   Minutes of Exercise per Session: 120 min  Stress: No Stress Concern Present   Feeling of Stress : Not at all  Social Connections: Moderately Isolated   Frequency of Communication with Friends and Family: More than three times a week   Frequency of Social Gatherings with Friends and Family: More than three times a week   Attends Religious Services: More than 4 times per year   Active Member of [employer] or Organizations: No   Attends Archivist Meetings: Never   Marital Status:  Widowed  Intimate Partner Violence: Not At Risk   Fear of Current or Ex-Partner: No   Emotionally Abused: No   Physically Abused: No   Sexually Abused: No    Past Surgical History:  Procedure Laterality Date   CARDIAC CATHETERIZATION N/A 10/19/2015   Procedure: Left Heart Cath and Coronary Angiography;  Surgeon: Charolette Forward, MD;  Location: [facility];  Service: Cardiovascular;  Laterality: N/A;   CARDIAC CATHETERIZATION N/A 10/19/2015   Procedure: Coronary Stent Intervention;  Surgeon: Charolette Forward, MD;  Location: [facility];  Service: Cardiovascular;  Laterality: N/A;   CATARACT EXTRACTION, BILATERAL Bilateral 01/2018   Dr. Kathrin Penner did surgery and he developed MD  and is seeing specialist now for MD treatment   COLONOSCOPY     "nothing showed up this time"   COLONOSCOPY W/ BIOPSIES AND POLYPECTOMY  X 1   CORONARY ANGIOPLASTY     EXCISION MASS NECK Left 01/08/2021   Procedure: EXCISION MASS NECK;  Surgeon: Melida Quitter, MD;  Location: [facility];  Service: ENT;  Laterality: Left;   FEMUR IM NAIL Right 07/26/2013   Procedure: INTRAMEDULLARY (IM) NAIL FEMORAL subtrochanteric;  Surgeon: Mauri Pole, MD;  Location: [facility];  Service: Orthopedics;  Laterality: Right;   FRACTURE SURGERY     INGUINAL HERNIA REPAIR Left >3 times   INGUINAL HERNIA REPAIR Right 2012   NEPHRECTOMY Left 2000   TONSILLECTOMY  1940s    Family History  Problem Relation Age of Onset   Drug abuse Other    Cancer Other    Heart disease Other    Lung disease Other    Diabetes Mother    Diabetes Sister    Colon cancer Neg Hx    Stomach cancer Neg Hx     Allergies  Allergen Reactions   Bee Venom Anaphylaxis    Current Outpatient Medications on File Prior to Visit  Medication Sig Dispense Refill   aspirin 81 MG chewable tablet Chew 81 mg by mouth in the morning.     atorvastatin (LIPITOR) 40 MG tablet Take 1 tablet (40 mg total) by mouth daily at 6 PM. 30 tablet 3   Cholecalciferol (VITAMIN  D-3 PO) Take 5,000 Units by mouth in the morning.     Cyanocobalamin (VITAMIN B-12) 5000 MCG SUBL Place 5,000 mcg under the tongue every 3 (three) days.     ELIQUIS 5 MG TABS tablet Take 5 mg by mouth 2 (two) times daily.     EPINEPHrine (EPI-PEN) 0.3 mg/0.3 mL DEVI Inject 0.3 mg into the muscle daily as needed (allergic reaction).     gabapentin (NEURONTIN) 100 MG capsule TAKE 1 CAPSULE BY MOUTH AT  BEDTIME (Patient taking differently: Take 100 mg by mouth at bedtime.) 90 capsule 3   glipiZIDE (GLUCOTROL) 5 MG tablet TAKE 1 TABLET BY MOUTH  TWICE DAILY BEFORE A MEAL 180 tablet 3   Insulin Syringes, Disposable, U-100 0.5 ML MISC Use 1x a day 100 each 3   lisinopril (ZESTRIL) 20 MG tablet Take 20 mg by mouth in the morning.     metFORMIN (GLUCOPHAGE) 1000 MG tablet TAKE 1 TABLET BY MOUTH  TWICE DAILY WITH MEALS 180 tablet 3   metoprolol succinate (TOPROL-XL) 100 MG 24 hr tablet Take 50-100 mg by mouth See admin instructions. Take 1 tablet (100 mg) gy mouth in the morning & take 0.5 tablet (50 mg) by mouth in the evening.     Multiple Vitamins-Minerals (PRESERVISION AREDS 2 PO) Take 1 tablet by mouth in the morning and at bedtime.     nitroGLYCERIN (NITROSTAT) 0.4 MG SL tablet Place 1 tablet (0.4 mg total) under the tongue every 5 (five) minutes as needed for chest pain. 25 tablet 1   ONETOUCH DELICA LANCETS FINE MISC Use to check sugar daily 100 each 5   ONETOUCH VERIO test strip USE 1 STRIP TO CHECK GLUCOSE ONCE DAILY 100 each 3   spironolactone (ALDACTONE) 25 MG tablet Take 25 mg by mouth in the morning.     tamsulosin (FLOMAX) 0.4 MG CAPS capsule TAKE 1 CAPSULE BY MOUTH  DAILY 90 capsule 3   No current facility-administered medications on file prior to visit.  BP 140/90    Pulse (!) 57    Temp 98.5 F (36.9 C) (Oral)    Ht 5\' 11"  (1.803 m)    Wt 255 lb (115.7 kg)    SpO2 94%    BMI 35.57 kg/m        Objective:   Physical Exam Vitals and nursing note reviewed.  Constitutional:       General: He is not in acute distress.    Appearance: Normal appearance. He is well-developed. He is obese.  HENT:     Head: Normocephalic and atraumatic.     Right Ear: Tympanic membrane, ear canal and external ear normal. There is no impacted cerumen.     Left Ear: Tympanic membrane, ear canal and external ear normal. There is no impacted cerumen.     Nose: Nose normal. No congestion or rhinorrhea.     Mouth/Throat:     Mouth: Mucous membranes are moist.     Pharynx: Oropharynx is clear. No oropharyngeal exudate or posterior oropharyngeal erythema.  Eyes:     General:        Right eye: No discharge.        Left eye: No discharge.     Extraocular Movements: Extraocular movements intact.     Conjunctiva/sclera: Conjunctivae normal.     Pupils: Pupils are equal, round, and reactive to light.  Neck:     Vascular: No carotid bruit.     Trachea: No tracheal deviation.  Cardiovascular:     Rate and Rhythm: Normal rate. Rhythm irregularly irregular.     Pulses: Normal pulses.     Heart sounds: Normal heart sounds. No murmur heard.   No friction rub. No gallop.  Pulmonary:     Effort: Pulmonary effort is normal. No respiratory distress.     Breath sounds: Normal breath sounds. No stridor. No wheezing, rhonchi or rales.  Chest:     Chest wall: No tenderness.  Abdominal:     General: Bowel sounds are normal. There is no distension.     Palpations: Abdomen is soft. There is no mass.     Tenderness: There is no abdominal tenderness. There is no right CVA tenderness, left CVA tenderness, guarding or rebound.     Hernia: No hernia is present.  Musculoskeletal:        General: No swelling, tenderness, deformity or signs of injury. Normal range of motion.     Right lower leg: No edema.     Left lower leg: No edema.  Lymphadenopathy:     Cervical: No cervical adenopathy.  Skin:    General: Skin is warm and dry.     Capillary Refill: Capillary refill takes less than 2 seconds.      Coloration: Skin is not jaundiced or pale.     Findings: No bruising, erythema, lesion or rash.  Neurological:     General: No focal deficit present.     Mental Status: He is alert and oriented to person, place, and time.     Cranial Nerves: No cranial nerve deficit.     Sensory: No sensory deficit.     Motor: No weakness.     Coordination: Coordination normal.     Gait: Gait normal.     Deep Tendon Reflexes: Reflexes normal.  Psychiatric:        Mood and Affect: Mood normal.        Behavior: Behavior normal.        Thought Content: Thought content normal.  Judgment: Judgment normal.      Assessment & Plan:  1. Routine general medical examination at a health care facility - Follow up in one year or sooner if needed - CBC with Differential/Platelet; Future - Comprehensive metabolic panel; Future - Lipid panel; Future - PSA; Future - TSH; Future - Vitamin B12; Future - Vitamin B12 - TSH - PSA - Lipid panel - Comprehensive metabolic panel - CBC with Differential/Platelet  2. 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[place]) - encouraged heart healthy diet and exercise - Follow up with Endocrinology as directed - CBC with Differential/Platelet; Future - Comprehensive metabolic panel; Future - Lipid panel; Future - PSA; Future - TSH; Future - Vitamin B12; Future - Vitamin B12 - TSH - PSA - Lipid panel - Comprehensive metabolic panel - CBC with Differential/Platelet  3. Atrial fibrillation, unspecified type ([place]) - Follow up with Cardiology as directed - Continue Eliquis   4. Essential hypertension - Controlled.  - No change in medication  - CBC with Differential/Platelet; Future - Comprehensive metabolic panel; Future - Lipid panel; Future - PSA; Future - TSH; Future - Vitamin B12; Future - Vitamin B12 - TSH - PSA - Lipid panel - Comprehensive metabolic panel - CBC with Differential/Platelet  5. Hyperlipidemia,  unspecified hyperlipidemia type - Continue statin  - CBC with Differential/Platelet; Future - Comprehensive metabolic panel; Future - Lipid panel; Future - PSA; Future - TSH; Future - Vitamin B12; Future - Vitamin B12 - TSH - PSA - Lipid panel - Comprehensive metabolic panel - CBC with Differential/Platelet  6. Benign prostatic hyperplasia with weak urinary stream  - PSA; Future - PSA  7. Vitamin B12 deficiency  - Vitamin B12; Future - Vitamin B12  8. NSTEMI (non-ST elevated myocardial infarction) ([place]) - Continue statin  - CBC with Differential/Platelet; Future - Comprehensive metabolic panel; Future - Lipid panel; Future - PSA; Future - TSH; Future - TSH - PSA - Lipid panel - Comprehensive metabolic panel - CBC with Differential/Platelet  9. Vitamin D deficiency  - VITAMIN D 25 Hydroxy (Vit-D Deficiency, Fractures); Future - VITAMIN D 25 Hydroxy (Vit-D Deficiency, Fractures)  [name], NP

## 2021-10-09 NOTE — Patient Instructions (Addendum)
It was great seeing you today   We will follow up with you regarding your lab work   Please let me know if you need anything   

## 2021-10-12 ENCOUNTER — Ambulatory Visit (INDEPENDENT_AMBULATORY_CARE_PROVIDER_SITE_OTHER): Payer: Medicare Other | Admitting: Adult Health

## 2021-10-12 ENCOUNTER — Encounter: Payer: Self-pay | Admitting: Adult Health

## 2021-10-12 VITALS — BP 120/80 | HR 112 | Temp 98.6°F | Ht 71.0 in | Wt 255.0 lb

## 2021-10-12 DIAGNOSIS — K047 Periapical abscess without sinus: Secondary | ICD-10-CM | POA: Diagnosis not present

## 2021-10-12 DIAGNOSIS — M272 Inflammatory conditions of jaws: Secondary | ICD-10-CM

## 2021-10-12 NOTE — Progress Notes (Signed)
Subjective:    Patient ID: Charles [name], male    DOB: 16-Jul-1941, 81 y.o.   MRN: [id number]  HPI 81 year old male who  has a past medical history of Anemia, Arthritis, AVM (arteriovenous malformation) of colon, Cancer of kidney ([place]) (2000), Coronary artery disease, Dysrhythmia, Eczema, GERD (gastroesophageal reflux disease), Hernia, ventral, History of blood transfusion, Hyperlipidemia, Hypertension, Myocardial infarction ([place]), Neuropathy, Obesity, Personal history of adenomatous colonic polyps (07/20/2012), Pneumonia (1942; 1950s X 1), Type II diabetes mellitus ([place]), and Walking pneumonia (2000's X 1).  81 year old male who is being evaluated today for concern of a dental abscess.  Does report that prior to coming in for the visit his dentist called him and sent in antibiotics for him.  Has been taking Advil as needed for pain relief and reports that this works well.  Has some mild swelling to the right lower jaw.  No discolored drainage or foul taste or odor in his mouth.   His symptoms started roughly 4 days ago   Review of Systems See HPI   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[place]) 2000   left nephrectomy   Coronary artery disease    Dysrhythmia    PAF   Eczema    GERD (gastroesophageal reflux disease)    Hernia, ventral    History of blood transfusion    Hyperlipidemia    Hypertension    Myocardial infarction ([place])    Neuropathy    Obesity    Personal history of adenomatous colonic polyps 07/20/2012   3 + adenomas 2009 07/20/2012 2 diminutive polyps     Pneumonia 1942; 1950s X 1   Type II diabetes mellitus ([place])    type II   Walking pneumonia 2000's X 1    Social History   Socioeconomic History   Marital status: Widowed    Spouse name: Not on file   Number of children: 3   Years of education: Not on  file   Highest education level: Master's degree (e.g., MA, MS, MEng, MEd, MSW, MBA)  Occupational History   Occupation: retired  Tobacco Use   Smoking status: Former    Years: 20.00    Types: Cigarettes, Pipe, Cigars    Quit date: 09/09/1968    Years since quitting: 53.1   Smokeless tobacco: Never   Tobacco comments:    smoking cigars- quit in 2006  , quit pipe 2000  Vaping Use   Vaping Use: Never used  Substance and Sexual Activity   Alcohol use: Yes    Alcohol/week: 2.0 standard drinks    Types: 2 Glasses of wine per week    Comment: 2 glasses on Tuesday   Drug use: No   Sexual activity: Never  Other Topics Concern   Not on file  Social History Narrative   Regular exercise: goes to the [facility] and walks   Caffeine use: daily; coffee   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     widowed    Three children, One in [place], one in [place] one in [place] - 2 : youngest daughter lives with him   Social Determinants of Health   Financial Resource Strain: Low Risk    Difficulty of Paying Living Expenses: Not hard  at all  Food Insecurity: No Food Insecurity   Worried About Charity fundraiser in the Last Year: Never true   Ran Out of Food in the Last Year: Never true  Transportation Needs: No Transportation Needs   Lack of Transportation (Medical): No   Lack of Transportation (Non-Medical): No  Physical Activity: Sufficiently Active   Days of Exercise per Week: 5 days   Minutes of Exercise per Session: 70 min  Stress: No Stress Concern Present   Feeling of Stress : Not at all  Social Connections: Moderately Isolated   Frequency of Communication with Friends and Family: More than three times a week   Frequency of Social Gatherings with Friends and Family: More than three times a week   Attends Religious Services: More than 4 times per year   Active Member of [employer] or Organizations: No   Attends Archivist Meetings: Never   Marital Status: Widowed   Intimate Partner Violence: Not At Risk   Fear of Current or Ex-Partner: No   Emotionally Abused: No   Physically Abused: No   Sexually Abused: No    Past Surgical History:  Procedure Laterality Date   CARDIAC CATHETERIZATION N/A 10/19/2015   Procedure: Left Heart Cath and Coronary Angiography;  Surgeon: Charolette Forward, MD;  Location: [facility];  Service: Cardiovascular;  Laterality: N/A;   CARDIAC CATHETERIZATION N/A 10/19/2015   Procedure: Coronary Stent Intervention;  Surgeon: Charolette Forward, MD;  Location: [facility];  Service: Cardiovascular;  Laterality: N/A;   CATARACT EXTRACTION, BILATERAL Bilateral 01/2018   Dr. Kathrin Penner did surgery and he developed MD and is seeing specialist now for MD treatment   COLONOSCOPY     "nothing showed up this time"   COLONOSCOPY W/ BIOPSIES AND POLYPECTOMY  X 1   CORONARY ANGIOPLASTY     EXCISION MASS NECK Left 01/08/2021   Procedure: EXCISION MASS NECK;  Surgeon: Melida Quitter, MD;  Location: [facility];  Service: ENT;  Laterality: Left;   FEMUR IM NAIL Right 07/26/2013   Procedure: INTRAMEDULLARY (IM) NAIL FEMORAL subtrochanteric;  Surgeon: Mauri Pole, MD;  Location: [facility];  Service: Orthopedics;  Laterality: Right;   FRACTURE SURGERY     INGUINAL HERNIA REPAIR Left >3 times   INGUINAL HERNIA REPAIR Right 2012   NEPHRECTOMY Left 2000   TONSILLECTOMY  1940s    Family History  Problem Relation Age of Onset   Drug abuse Other    Cancer Other    Heart disease Other    Lung disease Other    Diabetes Mother    Diabetes Sister    Colon cancer Neg Hx    Stomach cancer Neg Hx     Allergies  Allergen Reactions   Bee Venom Anaphylaxis    Current Outpatient Medications on File Prior to Visit  Medication Sig Dispense Refill   aspirin 81 MG chewable tablet Chew 81 mg by mouth in the morning.     atorvastatin (LIPITOR) 40 MG tablet Take 1 tablet (40 mg total) by mouth daily at 6 PM. 30 tablet 3   Cholecalciferol (VITAMIN D-3 PO)  Take 5,000 Units by mouth in the morning.     Cyanocobalamin (VITAMIN B-12) 5000 MCG SUBL Place 5,000 mcg under the tongue every 3 (three) days.     ELIQUIS 5 MG TABS tablet Take 5 mg by mouth 2 (two) times daily.     EPINEPHrine (EPI-PEN) 0.3 mg/0.3 mL DEVI Inject 0.3 mg into the muscle  daily as needed (allergic reaction).     gabapentin (NEURONTIN) 100 MG capsule TAKE 1 CAPSULE BY MOUTH AT  BEDTIME (Patient taking differently: Take 100 mg by mouth at bedtime.) 90 capsule 3   glipiZIDE (GLUCOTROL) 5 MG tablet TAKE 1 TABLET BY MOUTH  TWICE DAILY BEFORE A MEAL 180 tablet 3   Insulin Syringes, Disposable, U-100 0.5 ML MISC Use 1x a day 100 each 3   lisinopril (ZESTRIL) 20 MG tablet Take 20 mg by mouth in the morning.     metFORMIN (GLUCOPHAGE) 1000 MG tablet TAKE 1 TABLET BY MOUTH  TWICE DAILY WITH MEALS 180 tablet 3   metoprolol succinate (TOPROL-XL) 100 MG 24 hr tablet Take 50-100 mg by mouth See admin instructions. Take 1 tablet (100 mg) gy mouth in the morning & take 0.5 tablet (50 mg) by mouth in the evening.     Multiple Vitamins-Minerals (PRESERVISION AREDS 2 PO) Take 1 tablet by mouth in the morning and at bedtime.     nitroGLYCERIN (NITROSTAT) 0.4 MG SL tablet Place 1 tablet (0.4 mg total) under the tongue every 5 (five) minutes as needed for chest pain. 25 tablet 1   ONETOUCH DELICA LANCETS FINE MISC Use to check sugar daily 100 each 5   ONETOUCH VERIO test strip USE 1 STRIP TO CHECK GLUCOSE ONCE DAILY 100 each 3   spironolactone (ALDACTONE) 25 MG tablet Take 25 mg by mouth in the morning.     tamsulosin (FLOMAX) 0.4 MG CAPS capsule TAKE 1 CAPSULE BY MOUTH  DAILY 90 capsule 3   No current facility-administered medications on file prior to visit.    BP 120/80    Pulse (!) 112    Temp 98.6 F (37 C) (Oral)    Ht 5\' 11"  (1.803 m)    Wt 255 lb (115.7 kg)    SpO2 96%    BMI 35.57 kg/m       Objective:   Physical Exam Vitals and nursing note reviewed.  Constitutional:      Appearance:  Normal appearance.  HENT:     Mouth/Throat:     Dentition: Dental abscesses present.     Comments: Has some minor swelling to right lower jaw. Skin:    General: Skin is warm and dry.  Neurological:     General: No focal deficit present.     Mental Status: He is alert and oriented to person, place, and time. Mental status is at baseline.  Psychiatric:        Mood and Affect: Mood normal.        Behavior: Behavior normal.        Thought Content: Thought content normal.        Judgment: Judgment normal.      Assessment & Plan:  1. Dental abscess -Advised to start antibiotics that his dentist sent in.  Can continue with NSAIDs as needed for pain relief.  [name], NP

## 2021-10-26 ENCOUNTER — Ambulatory Visit (INDEPENDENT_AMBULATORY_CARE_PROVIDER_SITE_OTHER): Payer: Medicare Other

## 2021-10-26 DIAGNOSIS — E785 Hyperlipidemia, unspecified: Secondary | ICD-10-CM

## 2021-10-26 DIAGNOSIS — I10 Essential (primary) hypertension: Secondary | ICD-10-CM

## 2021-10-26 DIAGNOSIS — I4891 Unspecified atrial fibrillation: Secondary | ICD-10-CM

## 2021-10-26 DIAGNOSIS — E1165 Type 2 diabetes mellitus with hyperglycemia: Secondary | ICD-10-CM

## 2021-10-26 DIAGNOSIS — E1159 Type 2 diabetes mellitus with other circulatory complications: Secondary | ICD-10-CM

## 2021-10-26 NOTE — Patient Instructions (Signed)
Visit Information  Thank you for taking time to visit with me today. Please don't hesitate to contact me if I can be of assistance to you before our next scheduled telephone appointment.  Following are the goals we discussed today:  Take all medications as prescribed Attend all scheduled provider appointments Call pharmacy for medication refills 3-7 days in advance of running out of medications Perform all self care activities independently  Perform IADL's (shopping, preparing meals, housekeeping, managing finances) independently Call provider office for new concerns or questions  keep appointment with eye doctor check blood sugar at prescribed times: once daily and when you have symptoms of low or high blood sugar check feet daily for cuts, sores or redness take the blood sugar log to all doctor visits drink 6 to 8 glasses of water each day fill half of plate with vegetables limit fast food meals to no more than 1 per week manage portion size wash and dry feet carefully every day check pulse (heart) rate once a day make a plan to exercise regularly make a plan to eat healthy take medicine as prescribed check blood pressure 3 times per week choose a place to take my blood pressure (home, clinic or office, retail store) take blood pressure log to all doctor appointments call doctor for signs and symptoms of high blood pressure keep all doctor appointments eat more whole grains, fruits and vegetables, lean meats and healthy fats limit salt intake to 2300mg /day call for medicine refill 2 or 3 days before it runs out take all medications exactly as prescribed call doctor with any symptoms you believe are related to your medicine  Our next appointment is by telephone on 12/28/21 at 9 AM  Please call the care guide team at [phone] if you need to cancel or reschedule your appointment.   If you are experiencing a Mental Health or [place] or need someone to talk to,  please call the Suicide and Crisis Lifeline: 988 call the [place] National Suicide Prevention Lifeline: 251-257-4426 or TTY: 213-593-4136 TTY 214-787-1673) to talk to a trained counselor call 1-800-273-TALK (toll free, 24 hour hotline) go to [facility] [address], [place] [phone]) call 911   Patient verbalizes understanding of instructions and care plan provided today and agrees to view in [place]. Active MyChart status confirmed with patient.    Peter Garter RN, Jackquline Denmark, CDE Care Management Coordinator [facility] [phone]

## 2021-10-26 NOTE — Chronic Care Management (AMB) (Signed)
Chronic Care Management   CCM RN Visit Note  10/26/2021 Name: Charles [name] MRN: [id number] DOB: 01-Oct-1940  Subjective: Charles [name] is a 81 y.o. year old male who is a primary care patient of [name], NP. The care management team was consulted for assistance with disease management and care coordination needs.    Engaged with patient by telephone for follow up visit in response to provider referral for case management and/or care coordination services.   Consent to Services:  The patient was given information about Chronic Care Management services, agreed to services, and gave verbal consent prior to initiation of services.  Please see initial visit note for detailed documentation.   Patient agreed to services and verbal consent obtained.   Assessment: Review of patient past medical history, allergies, medications, health status, including review of consultants reports, laboratory and other test data, was performed as part of comprehensive evaluation and provision of chronic care management services.   SDOH (Social Determinants of Health) assessments and interventions performed:    CCM Care Plan  Allergies  Allergen Reactions   Bee Venom Anaphylaxis    Outpatient Encounter Medications as of 10/26/2021  Medication Sig   aspirin 81 MG chewable tablet Chew 81 mg by mouth in the morning.   atorvastatin (LIPITOR) 40 MG tablet Take 1 tablet (40 mg total) by mouth daily at 6 PM.   Cholecalciferol (VITAMIN D-3 PO) Take 5,000 Units by mouth in the morning.   Cyanocobalamin (VITAMIN B-12) 5000 MCG SUBL Place 5,000 mcg under the tongue every 3 (three) days.   ELIQUIS 5 MG TABS tablet Take 5 mg by mouth 2 (two) times daily.   EPINEPHrine (EPI-PEN) 0.3 mg/0.3 mL DEVI Inject 0.3 mg into the muscle daily as needed (allergic reaction).   gabapentin (NEURONTIN) 100 MG capsule TAKE 1 CAPSULE BY MOUTH AT  BEDTIME (Patient taking differently: Take 100 mg by mouth at bedtime.)   glipiZIDE  (GLUCOTROL) 5 MG tablet TAKE 1 TABLET BY MOUTH  TWICE DAILY BEFORE A MEAL   Insulin Syringes, Disposable, U-100 0.5 ML MISC Use 1x a day   lisinopril (ZESTRIL) 20 MG tablet Take 20 mg by mouth in the morning.   metFORMIN (GLUCOPHAGE) 1000 MG tablet TAKE 1 TABLET BY MOUTH  TWICE DAILY WITH MEALS   metoprolol succinate (TOPROL-XL) 100 MG 24 hr tablet Take 50-100 mg by mouth See admin instructions. Take 1 tablet (100 mg) gy mouth in the morning & take 0.5 tablet (50 mg) by mouth in the evening.   Multiple Vitamins-Minerals (PRESERVISION AREDS 2 PO) Take 1 tablet by mouth in the morning and at bedtime.   nitroGLYCERIN (NITROSTAT) 0.4 MG SL tablet Place 1 tablet (0.4 mg total) under the tongue every 5 (five) minutes as needed for chest pain.   ONETOUCH DELICA LANCETS FINE MISC Use to check sugar daily   ONETOUCH VERIO test strip USE 1 STRIP TO CHECK GLUCOSE ONCE DAILY   spironolactone (ALDACTONE) 25 MG tablet Take 25 mg by mouth in the morning.   tamsulosin (FLOMAX) 0.4 MG CAPS capsule TAKE 1 CAPSULE BY MOUTH  DAILY   No facility-administered encounter medications on file as of 10/26/2021.    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   Vitamin B12 deficiency 01/18/2016   NSTEMI (non-ST elevated myocardial infarction) ([place]) 10/19/2015   Acute non Q wave MI (myocardial infarction), initial episode of care ([place]) 10/19/2015   Peripheral neuropathy 10/13/2015   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[place])  07/14/2015   Actinic keratosis of right cheek 10/04/2014   Contact dermatitis and eczema 02/10/2014   Secondary renovascular hypertension, 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 Afib,HTN and HLD)   Priority: High     Long-Range Goal: Establish Plan of Care for Chronic Disease Management Needs (DM. Afib,HTN and HLD)   Start Date: 08/24/2021  Expected End Date: 02/20/2022  Priority: High  Note:   Current Barriers:  Knowledge Deficits related to plan of care for management of Atrial Fibrillation, HTN, HLD, and DMII  Chronic Disease Management support and education needs related to Atrial Fibrillation, HTN, HLD, and DMII  States his fasting readings range from 110-150. Denies any hypoglycemia.  States he had a dental abscess and had to have a tooth pulled.  States his sugars were up some during that time.   States he sometimes forgets his evening medications even though he uses a pill box. States he occasionally eats  sweets  and has pizza sometimes  States he is still going to gym 5 days a week walking 1-2 miles and working on IT trainer. Deneis any chest pains, shortness of breath or swelling.  States his B/P is good when he checks at the [facility].  States he saw cardiology 09/26/21 and will go back in 3 months. States he had his first shingles shot on 09/15/21.  RNCM Clinical Goal(s):  Patient will verbalize understanding of plan for management of Atrial  Fibrillation, HTN, HLD, and DMII as evidenced by voiced adherence to plan of care verbalize basic understanding of  Atrial Fibrillation, HTN, HLD, and DMII disease process and self health management plan as evidenced by voiced understanding and teach back take all medications exactly as prescribed and will call provider for medication related questions as evidenced by dispense report and pt verbalization attend all scheduled medical appointments: Annual wellness visit 12/26/21, CCM PharmD 11/26/21, endocrinology 12/19/21, cardiology April as evidenced by medical records demonstrate Improved adherence to prescribed treatment plan for Atrial Fibrillation, HTN, HLD, and DMII as evidenced by readings within limits, voiced adherence to plan of care continue to work with RN Care Manager to address care management and care coordination needs related to  Atrial Fibrillation, HTN, HLD, and DMII as evidenced by adherence to CM Team Scheduled appointments through collaboration with RN Care manager, provider, and care team.   Interventions: 1:1 collaboration with primary care provider regarding development and update of comprehensive plan of care as evidenced by provider attestation and co-signature Inter-disciplinary care team collaboration (see  longitudinal plan of care) Evaluation of current treatment plan related to  self management and patient's adherence to plan as established by provider   Health Maintenance Interventions:  (Status:  New goal.) Long Term Goal Patient interviewed about adult health maintenance status including  Cholesterol/Lipid screen      Diabetes Eye Exam    Shingrix  Advised patient to discuss  Cholesterol/Lipid screen      Diabetes Eye Exam    Shingrix  with primary care provider  Historical Immunization for documented Shingrix first shot Patient provided CDC guideline information about Shingles vaccine    Reviewed new ADA guidelines for LDL and encouraged to discuss with Dr.  Cruzita Lederer.  Reviewed to have eye doctor send report to provider when he has his May eye exam    AFIB Interventions: (Status:  Goal on track:  Yes.) Long Term Goal   Counseled on increased risk of stroke due to Afib and benefits of anticoagulation for stroke prevention Reviewed importance of adherence to anticoagulant exactly as prescribed Counseled on bleeding risk associated with Eliquis and importance of self-monitoring for signs/symptoms of bleeding Counseled on seeking medical attention after a head injury or if there is blood in the urine/stool Reviewed to keep appointment with cardiology   Diabetes Interventions:  (Status:  Goal on track:  Yes.) Long Term Goal Assessed patient's understanding of A1c goal: <7% Provided education to patient about basic DM disease process Reviewed medications with patient and discussed importance of medication adherence Counseled on importance of regular laboratory monitoring as prescribed Discussed plans with patient for ongoing care management follow up and provided patient with direct contact information for care management team Provided patient with written educational materials related to hypo and hyperglycemia and importance of correct treatment Advised patient, providing education and rationale, to check cbg daily at different times and record, calling provider for findings outside established parameters Review of patient status, including review of consultants reports, relevant laboratory and other test results, and medications completed Reviewed to that infection can make his blood sugars range higher. Reinforced to set alarm on phone to remind him to take medications Lab Results  Component Value Date   HGBA1C 7.5 (A) 08/20/2021   Hyperlipidemia Interventions:  (Status:  Goal on track:  Yes.) Long Term Goal Medication review performed; medication list updated in electronic medical record.  Provider established cholesterol goals reviewed Counseled  on importance of regular laboratory monitoring as prescribed Reviewed role and benefits of statin for ASCVD risk reduction Reviewed importance of limiting foods high in cholesterol Reviewed exercise goals and target of 150 minutes per week Reviewed to discuss lipid values with provider and endocrinologist and need to get lipids tested while fasting  Hypertension Interventions:  (Status:  Goal on track:  Yes.) Long Term Goal Last practice recorded BP readings:  BP Readings from Last 3 Encounters:  10/12/21 120/80  10/09/21 132/80  08/20/21 120/78  Most recent eGFR/CrCl: No results found for: EGFR  No components found for: CRCL  Evaluation of current treatment plan related to hypertension self management and patient's adherence to plan as established by provider Provided education to patient re: stroke prevention, s/s of heart attack and stroke Counseled on the importance of exercise goals with target of 150 minutes per week Discussed plans with patient for ongoing care management follow up and provided patient with direct contact information for care management team Advised patient, providing education and rationale, to monitor blood pressure daily and record, calling PCP for findings outside established parameters  Provided education on prescribed diet low sodium low CHO Reviewed target B/P values and to call provider if needed  Patient Goals/Self-Care Activities: Take all medications as prescribed Attend all scheduled provider appointments Call pharmacy for medication refills 3-7 days in advance of running out of medications Perform all self care activities independently  Perform IADL's (shopping, preparing meals, housekeeping, managing finances) independently Call provider office for new concerns or questions  keep appointment with eye doctor check blood sugar at prescribed times: once daily and when you have symptoms of low or high blood sugar check feet daily for cuts, sores or  redness take the blood sugar log to all doctor visits drink 6 to 8 glasses of water each day fill half of plate with vegetables limit fast food meals to no more than 1 per week manage portion size wash and dry feet carefully every day check pulse (heart) rate once a day make a plan to exercise regularly make a plan to eat healthy take medicine as prescribed check blood pressure 3 times per week choose a place to take my blood pressure (home, clinic or office, retail store) take blood pressure log to all doctor appointments call doctor for signs and symptoms of high blood pressure keep all doctor appointments eat more whole grains, fruits and vegetables, lean meats and healthy fats limit salt intake to 2346m/day call for medicine refill 2 or 3 days before it runs out take all medications exactly as prescribed call doctor with any symptoms you believe are related to your medicine  Follow Up Plan:  Telephone follow up appointment with care management team member scheduled for:  12/28/21 The patient has been provided with contact information for the care management team and has been advised to call with any health related questions or concerns.       Plan:Telephone follow up appointment with care management team member scheduled for:  12/28/21 The patient has been provided with contact information for the care management team and has been advised to call with any health related questions or concerns.  MPeter GarterRN, BJackquline Denmark CDE Care Management Coordinator [facility] [phone]

## 2021-11-06 DIAGNOSIS — C4442 Squamous cell carcinoma of skin of scalp and neck: Secondary | ICD-10-CM | POA: Diagnosis not present

## 2021-11-06 DIAGNOSIS — E785 Hyperlipidemia, unspecified: Secondary | ICD-10-CM | POA: Diagnosis not present

## 2021-11-06 DIAGNOSIS — I10 Essential (primary) hypertension: Secondary | ICD-10-CM | POA: Diagnosis not present

## 2021-11-06 DIAGNOSIS — E1165 Type 2 diabetes mellitus with hyperglycemia: Secondary | ICD-10-CM

## 2021-11-06 DIAGNOSIS — E1159 Type 2 diabetes mellitus with other circulatory complications: Secondary | ICD-10-CM

## 2021-11-06 DIAGNOSIS — L570 Actinic keratosis: Secondary | ICD-10-CM | POA: Diagnosis not present

## 2021-11-06 DIAGNOSIS — I4891 Unspecified atrial fibrillation: Secondary | ICD-10-CM

## 2021-11-16 ENCOUNTER — Telehealth: Payer: Medicare Other

## 2021-11-23 ENCOUNTER — Telehealth: Payer: Self-pay | Admitting: Pharmacist

## 2021-11-23 NOTE — Chronic Care Management (AMB) (Signed)
? ? ?  Chronic Care Management ?Pharmacy Assistant  ? ?Name: Charles [name]  MRN: [id number] DOB: 1941/03/11 ? ?10/29/2021 APPOINTMENT REMINDER ? ?AVORY MIMBS was reminded to have all medications, supplements and any blood glucose and blood pressure readings available for review with Jeni Salles, Pharm. D, at his telephone visit on 11/08/2021 at 3:30. ? ? ?Questions: ?Have you had any recent office visit or specialist visit outside of [place]? Patient saw [facility] on 11/06/2021 ? ?Are there any concerns you would like to discuss during your office visit? Patient denies any concerns at this time.  ? ?Are you having any problems obtaining your medications? (Whether it pharmacy issues or cost) Patient denies any issues getting medications.  ? ?If patient has any PAP medications ask if they are having any problems getting their PAP medication or refill? Patient denies any medications coming from pap ? ?Care Gaps: ?AWV - Scheduled for 12/26/2021 ?Last BP - 120/80 on 10/12/2021 ?Covid booster - overdue ?Eye exam - overdue ?Shingrix - overdue ?  ?Star Rating Drugs: ?Atorvastatin '40mg'$  - last filled 10/06/2021 90DS at [facility] ?Glipizide '5mg'$  - last filled 10/06/2021 90DS at [facility] ?Lisinopril '20mg'$  - last filled 11/08/2021 90DS at [facility] ?Metformin '1000mg'$  - last filled 11/16/2021 90DS at [facility] ?  ? ? ?Any gaps in medications fill history? No ? ?Gennie Alma CMA  ?Clinical Pharmacist Assistant ?[phone] ? ?

## 2021-11-26 ENCOUNTER — Ambulatory Visit (INDEPENDENT_AMBULATORY_CARE_PROVIDER_SITE_OTHER): Payer: Medicare Other | Admitting: Pharmacist

## 2021-11-26 DIAGNOSIS — E1159 Type 2 diabetes mellitus with other circulatory complications: Secondary | ICD-10-CM

## 2021-11-26 DIAGNOSIS — I10 Essential (primary) hypertension: Secondary | ICD-10-CM

## 2021-11-26 NOTE — Patient Instructions (Addendum)
Hi Charles [name], ? ?It was great to catch up with you again! Just as we discussed, go ahead and ask the pharmacy about the differences in the Ozempic 1 pen vs 2 pen and talk to Dr. Cruzita Lederer about starting it. There are definitely a lot of benefits for you to take it and it would likely replace one of your other diabetes medications.  ? ?Also, if you do continue with the glipizide please try to get it in prior to meal time as we can avoid possible lows with you taking it after you are eating. ? ?Please reach out to me if you have any questions or need anything before our follow up! ? ?Best, ?[name] ? ?Charles [name], PharmD, BCACP ?Clinical Pharmacist ?Therapist, music at [facility] ?[phone] ? ? Visit Information ? ? Goals Addressed   ?None ?  ? ?Patient Care Plan: RNCM:Atrial Fibrillation (Adult)  ?Completed 08/24/2021  ? ?Problem Identified: Lack of self mangement plan for Atrial Fibrillation Resolved 08/24/2021  ?Priority: High  ?  ? ?Long-Range Goal: RNCM: Develolp and self manage atrial fibrillation Completed 08/24/2021  ?Start Date: 10/18/2020  ?Expected End Date: 12/08/2021  ?Recent Progress: On track  ?Priority: High  ?Note:   ?Resolving due to duplicate goal  ?Current Barriers:  ?Knowledge Deficits related to new dx of atrial fibrillation with hx of HTN and HLD -Reports no atrial fibrillation symptoms. Denies any shortness of breath,chest pain or swelling.  Reports taking Eliquis as directed.  States he saw Dr. Terrence Dupont in September and he did not change any of his medications. States he is to see him again in January. Denies any swelling and his weight has been stable.  States his B/P usually good when he checks it at the [facility].    ?Chronic Disease Management support and education needs related to Atrial fibrillation ?Nurse Case Manager Clinical Goal(s):  ?Over the next 120 days, patient will verbalize understanding of plan for self management of atrial fibrillation ?Over the next 120 days, patient will verbalize  basic understanding of atrial fibrillation disease process and self health management plan as evidenced by decrease in symptoms, adherence to medications, keeping provider appts  ?Interventions:  ?1:1 collaboration with [name], NP regarding development and update of comprehensive plan of care as evidenced by provider attestation and co-signature ?Inter-disciplinary care team collaboration (see longitudinal plan of care)  ?Reinforced patient to call provider for worsening symptoms of atrial fibrillation  ?Reinforced education to patient re: atrial fibrillation disease process and atrial fibrillation action plan  ?Reviewed medications with patient and discussed importance of taking medications as ordered to lower heart rate and taking anticoagulant ?Reviewed scheduled/upcoming provider appointments including: Dr. Terrence Dupont cardiology January 2023, Dr. Cruzita Lederer 08/20/21 ?Discussed plans with patient for ongoing care management follow up and provided patient with direct contact information for care management team ?Reinforced to keep a log of his weights and B/P to take to his appt with Dr.Harwani ?Patient Goals/Self-Care Activities ?Patient will attend all scheduled provider appointments ?Patient will call provider office for new concerns or questions ?- check pulse (heart) rate once a day ?- cut down alcohol use ?- make a plan to exercise regularly ?- make a plan to eat healthy ?- take medicine as prescribed ?-review atrial fibrillation action plan ?-take anticoagulant to prevent stroke ?-take log of B/P and weights to cardiology visit ?Follow Up Plan: Telephone follow up appointment with care management team member scheduled for: 08/24/21 at 9 AM ?The patient has been provided with contact information for the care  management team and has been advised to call with any health related questions or concerns.   ? ? ?  ?  ? ?Patient Care Plan: RNCM:Diabetes Type 2 (Adult)  ?Completed 08/24/2021  ? ?Problem Identified:  RNCM: Lack of effective self management of Diabetes Type 2 Resolved 08/24/2021  ?Priority: Medium  ?  ? ?Long-Range Goal: Effective self management of blood sugars and Diabetes Type 2 Completed 08/24/2021  ?Start Date: 10/18/2020  ?Expected End Date: 12/08/2021  ?Recent Progress: On track  ?Priority: Medium  ?Note:   ?Resolving due to duplicate goal  ?Objective:  ?Lab Results  ?Component Value Date  ? HGBA1C 7.9 (A) 08/21/2020  ? ?Lab Results  ?Component Value Date  ? CREATININE 1.06 10/06/2020  ? CREATININE 1.02 10/08/2019  ? CREATININE 1.21 10/26/2018  ?Current Barriers:  ?Knowledge Deficits related to basic Diabetes pathophysiology and self care/management with hx of HTN and atrial fib.  ?Does not adhere to provider recommendations re: Low CHO diet and portion control. States his fasting readings range from 105-160.  States he only has the higher readings once or twice a week with most readings in the low 100's.  States he can not identify anything different on the days his CBG is higher.  States his diet and exercise are the same  States he was checking his after meal readings a few times but has not checked much recently. Denies any hypoglycemia.  States he is watching portions better and avoiding second helpings.  States he had been eating more vegetables in the summer.  States he is still going to gym 5 days a week walking 1-2 miles and working on IT trainer.  States he has had an issue with his rt hand and saw sports medicine for his trigger finger.  States he might need a cortisone shot in his hand ?Case Manager Clinical Goal(s):  ?patient will demonstrate improved adherence to prescribed treatment plan for diabetes self care/management as evidenced by: daily monitoring and recording of CBG  adherence to ADA/ carb modified diet exercise 3 days/week adherence to prescribed medication regimen ?Interventions:  ?Collaboration with [name], NP regarding development and update of comprehensive  plan of care as evidenced by provider attestation and co-signature ?Inter-disciplinary care team collaboration (see longitudinal plan of care) ?Reinforced education to patient about basic DM disease process ?Reviewed medications with patient and discussed importance of medication adherence ?Discussed plans with patient for ongoing care management follow up and provided patient with direct contact information for care management team ?Reviewed scheduled/upcoming provider appointments including: Dr. Terrence Dupont cardiology January 2023, Dr. Cruzita Lederer endocrinologist 08/20/21, Dr. Tamala Julian sports med 08/08/21 ?Reinforced  to check cbg daily at varying times and record, calling provider for findings outside established parameters.   ?Reinforced importance of getting regular exercise with resistance training at least 2 times a week ?Reinforced the rule of 15 for hypoglycemia and discussed sources of fast acting sugar to have on hand ?Reviewed how cortisone shot can increase his blood sugar temporarily  ?Patient Goals/Self-Care Activities ?Over the next 90 days, patient will:  ?- Self administers oral medications as prescribed ?Attends all scheduled provider appointments ?Checks blood sugars as prescribed and utilize hyper and hypoglycemia protocol as needed ?Adheres to prescribed ADA/carb modified ?- check blood sugar at prescribed times try checking 1 1/2-2 hours after eating ?-try checking blood sugar after eating at different times ?-recheck blood sugar if you get an unexpected number ?- check blood sugar before and after exercise ?- check blood sugar if I  feel it is too high or too low ?- enter blood sugar readings and medication or insulin into daily log ?- take the blood sugar log to all doctor visits ?- take the blood sugar meter to all doctor visits ? -keep glucose tablets or some other fast acting sugar on hand to treat low blood sugars ?Follow Up Plan: Telephone follow up appointment with care management team member  scheduled for: 08/24/21 at 9 AM ?The patient has been provided with contact information for the care management team and has been advised to call with any health related questions or concerns.   ?  ? ?Patien

## 2021-11-26 NOTE — Progress Notes (Signed)
? ?Chronic Care Management ?Pharmacy Note ? ?11/26/2021 ?Name:  Charles [name] MRN:  [id number] DOB:  01-Mar-1941 ? ?Summary: ?A1c not at goal of < 7% ?BP at goal < 130/80 per home readings ?Pt is interested in [place] for weight loss ?  ?Recommendations/Changes made from today's visit: ?-Recommended discussing with Dr. Cruzita Lederer about starting Ozempic to replace glipizide ?-Recommended taking glipizide prior to meal times ?  ?Plan: ?DM assessment in 3 months ?Follow up in 6 months ? ?Subjective: ?Charles LINGELBACH is an 81 y.o. year old male who is a primary patient of [name], NP.  The CCM team was consulted for assistance with disease management and care coordination needs.   ? ?Engaged with patient by telephone for follow up visit in response to provider referral for pharmacy case management and/or care coordination services.  ? ?Consent to Services:  ?The patient was given information about Chronic Care Management services, agreed to services, and gave verbal consent prior to initiation of services.  Please see initial visit note for detailed documentation.  ? ?Patient Care Team: ?[name], NP as PCP - General (Family Medicine) ?Melida Quitter, MD as Consulting Physician (Otolaryngology) ?Charolette Forward, MD as Consulting Physician (Cardiology) ?Philemon Kingdom, MD as Consulting Physician (Internal Medicine) ?Viona Gilmore, [facility] as Pharmacist (Pharmacist) ?Dimitri Ped, RN as Case Manager ? ?Recent office visits: ?10/26/21 Harrell Gave, RN: Patient presented for CCM visit. ? ?10/12/21 [name], NP: Patient presented for mouth lesions. ? ?10/09/21 [name], NP: Patient presented for annual exam. Triglycerides increased but all other labs WNL. ? ?Recent consult visits: ?08/20/2021 Layla Maw MD (internal medicine) - Patient was seen for poorly controlled type 2 diabetes and additional issues. Recommended moving glipizide 15 to 30 minutes prior to meals and recommended increasing to 10 mg  before dinner and 5 mg in the morning. Follow up in 4 months. ?  ?08/08/2021 Hulan Saas DO (sports medicine) - Patient was seen for acquired trigger finger of right index finger and additional issue. No medication changes. No follow up noted. ? ?Hospital visits: ?None in previous 6 months. ? ?Objective: ? ?Lab Results  ?Component Value Date  ? CREATININE 1.22 10/09/2021  ? BUN 18 10/09/2021  ? GFR 55.98 (L) 10/09/2021  ? GFRNONAA >60 01/08/2021  ? GFRAA 53 (L) 09/29/2018  ? NA 137 10/09/2021  ? K 4.9 10/09/2021  ? CALCIUM 9.4 10/09/2021  ? CO2 31 10/09/2021  ? GLUCOSE 187 (H) 10/09/2021  ? ? ?Lab Results  ?Component Value Date/Time  ? HGBA1C 7.5 (A) 08/20/2021 10:01 AM  ? HGBA1C 6.9 (A) 05/01/2021 11:47 AM  ? HGBA1C 7.9 (H) 09/29/2018 02:23 AM  ? HGBA1C 6.9 (H) 08/08/2016 08:56 AM  ? GFR 55.98 (L) 10/09/2021 10:16 AM  ? GFR 65.23 10/25/2020 10:19 AM  ? MICROALBUR 13.8 (H) 08/08/2016 08:56 AM  ? MICROALBUR 11.4 (H) 07/21/2015 08:37 AM  ?  ?Last diabetic Eye exam:  ?Lab Results  ?Component Value Date/Time  ? HMDIABEYEEXA No Retinopathy 11/12/2017 12:00 AM  ?  ?Last diabetic Foot exam:  ?Lab Results  ?Component Value Date/Time  ? HMDIABFOOTEX yes 11/24/2009 12:00 AM  ?  ? ?Lab Results  ?Component Value Date  ? CHOL 148 10/09/2021  ? HDL 45.20 10/09/2021  ? [place] 63 10/09/2021  ? LDLDIRECT 139.8 07/08/2014  ? TRIG 196.0 (H) 10/09/2021  ? CHOLHDL 3 10/09/2021  ? ? ?Hepatic Function Latest Ref Rng & Units 10/09/2021 10/25/2020 10/06/2020  ?Total Protein 6.0 - 8.3 g/dL 7.3  6.5 6.6  ?Albumin 3.5 - 5.2 g/dL 4.3 4.1 4.1  ?AST 0 - 37 U/L '24 23 23  ' ?ALT 0 - 53 U/L '25 21 22  ' ?Alk Phosphatase 39 - 117 U/L 68 75 72  ?Total Bilirubin 0.2 - 1.2 mg/dL 0.7 0.6 0.6  ?Bilirubin, Direct 0.0 - 0.3 mg/dL - - -  ? ? ?Lab Results  ?Component Value Date/Time  ? TSH 3.52 10/09/2021 10:16 AM  ? TSH 2.94 10/06/2020 09:40 AM  ? ? ?CBC Latest Ref Rng & Units 10/09/2021 01/08/2021 10/25/2020  ?WBC 4.0 - 10.5 K/uL 6.1 4.7 4.6  ?Hemoglobin 13.0 - 17.0  g/dL 14.4 13.4 12.1(L)  ?Hematocrit 39.0 - 52.0 % 43.3 41.6 37.7(L)  ?Platelets 150.0 - 400.0 K/uL 219.0 211 208.0  ? ? ?Lab Results  ?Component Value Date/Time  ? VD25OH 61.75 10/09/2021 10:16 AM  ? VD25OH 45 04/06/2021 03:50 PM  ? VD25OH 19.55 (L) 10/06/2020 09:40 AM  ? ? ?Clinical ASCVD: Yes  ?The ASCVD Risk score (Arnett DK, et al., 2019) failed to calculate for the following reasons: ?  The 2019 ASCVD risk score is only valid for ages 24 to 81 ?  The patient has a prior MI or stroke diagnosis   ? ?Depression screen [facility] 2/9 10/09/2021 12/20/2020 10/18/2020  ?Decreased Interest 0 0 0  ?Down, Depressed, Hopeless 0 0 -  ?PHQ - 2 Score 0 0 0  ?Some recent data might be hidden  ?  ? ?CHA2DS2/VAS Stroke Risk Points  Current as of 2 minutes ago  ?   5 >= 2 Points: High Risk  ?1 - 1.99 Points: Medium Risk  ?0 Points: Low Risk  ?  No Change   ?  ? Details   ? This score determines the patient's risk of having a stroke if the  ?patient has atrial fibrillation.  ?  ?  ? Points Metrics  ?0 Has Congestive Heart Failure:  No   ? Current as of 2 minutes ago  ?1 Has Vascular Disease:  Yes    ? Current as of 2 minutes ago  ?1 Has Hypertension:  Yes   ? Current as of 2 minutes ago  ?2 Age:  81   ? Current as of 2 minutes ago  ?1 Has Diabetes:  Yes   ? Current as of 2 minutes ago  ?0 Had Stroke:  No  Had TIA:  No  Had Thromboembolism:  No   ? Current as of 2 minutes ago  ?0 Male:  No   ? Current as of 2 minutes ago  ?  ? ?Social History  ? ?Tobacco Use  ?Smoking Status Former  ? Years: 20.00  ? Types: Cigarettes, Pipe, Cigars  ? Quit date: 09/09/1968  ? Years since quitting: 53.2  ?Smokeless Tobacco Never  ?Tobacco Comments  ? smoking cigars- quit in 2006  , quit pipe 2000  ? ?BP Readings from Last 3 Encounters:  ?10/12/21 120/80  ?10/09/21 132/80  ?08/20/21 120/78  ? ?Pulse Readings from Last 3 Encounters:  ?10/12/21 (!) 112  ?10/09/21 (!) 57  ?08/20/21 67  ? ?Wt Readings from Last 3 Encounters:  ?10/12/21 255 lb (115.7 kg)   ?10/09/21 255 lb (115.7 kg)  ?08/20/21 258 lb 3.2 oz (117.1 kg)  ? ?BMI Readings from Last 3 Encounters:  ?10/12/21 35.57 kg/m?  ?10/09/21 35.57 kg/m?  ?08/20/21 36.01 kg/m?  ? ? ?Assessment/Interventions: Review of patient past medical history, allergies, medications, health status, including review of consultants reports,  laboratory and other test data, was performed as part of comprehensive evaluation and provision of chronic care management services.  ? ?SDOH:  (Social Determinants of Health) assessments and interventions performed: No ? ?SDOH Screenings  ? ?Alcohol Screen: Low Risk   ? Last Alcohol Screening Score (AUDIT): 2  ?Depression (PHQ2-9): Low Risk   ? PHQ-2 Score: 0  ?Financial Resource Strain: Low Risk   ? Difficulty of Paying Living Expenses: Not hard at all  ?Food Insecurity: No Food Insecurity  ? Worried About Charity fundraiser in the Last Year: Never true  ? Ran Out of Food in the Last Year: Never true  ?Housing: Low Risk   ? Last Housing Risk Score: 0  ?Physical Activity: Sufficiently Active  ? Days of Exercise per Week: 5 days  ? Minutes of Exercise per Session: 70 min  ?Social Connections: Moderately Isolated  ? Frequency of Communication with Friends and Family: More than three times a week  ? Frequency of Social Gatherings with Friends and Family: More than three times a week  ? Attends Religious Services: More than 4 times per year  ? Active Member of Clubs or Organizations: No  ? Attends Archivist Meetings: Never  ? Marital Status: Widowed  ?Stress: No Stress Concern Present  ? Feeling of Stress : Not at all  ?Tobacco Use: Medium Risk  ? Smoking Tobacco Use: Former  ? Smokeless Tobacco Use: Never  ? Passive Exposure: Not on file  ?Transportation Needs: No Transportation Needs  ? Lack of Transportation (Medical): No  ? Lack of Transportation (Non-Medical): No  ? ? ?CCM Care Plan ? ?Allergies  ?Allergen Reactions  ? Bee Venom Anaphylaxis  ? ? ?Medications Reviewed Today   ? ?  Reviewed by Viona Gilmore, [place] (Pharmacist) on 11/26/21 at [place] List Status: <None>  ? ?Medication Order Taking? Sig Documenting Provider Last Dose Status Informant  ?aspirin 81 MG chewable tablet [id number]

## 2021-11-29 DIAGNOSIS — H34811 Central retinal vein occlusion, right eye, with macular edema: Secondary | ICD-10-CM | POA: Diagnosis not present

## 2021-11-29 DIAGNOSIS — H35372 Puckering of macula, left eye: Secondary | ICD-10-CM | POA: Diagnosis not present

## 2021-11-29 DIAGNOSIS — H43812 Vitreous degeneration, left eye: Secondary | ICD-10-CM | POA: Diagnosis not present

## 2021-11-29 DIAGNOSIS — H353133 Nonexudative age-related macular degeneration, bilateral, advanced atrophic without subfoveal involvement: Secondary | ICD-10-CM | POA: Diagnosis not present

## 2021-11-29 DIAGNOSIS — E113293 Type 2 diabetes mellitus with mild nonproliferative diabetic retinopathy without macular edema, bilateral: Secondary | ICD-10-CM | POA: Diagnosis not present

## 2021-12-07 DIAGNOSIS — E1159 Type 2 diabetes mellitus with other circulatory complications: Secondary | ICD-10-CM

## 2021-12-07 DIAGNOSIS — I10 Essential (primary) hypertension: Secondary | ICD-10-CM | POA: Diagnosis not present

## 2021-12-07 DIAGNOSIS — E1165 Type 2 diabetes mellitus with hyperglycemia: Secondary | ICD-10-CM | POA: Diagnosis not present

## 2021-12-19 ENCOUNTER — Encounter: Payer: Self-pay | Admitting: Internal Medicine

## 2021-12-19 ENCOUNTER — Ambulatory Visit: Payer: Medicare Other | Admitting: Internal Medicine

## 2021-12-19 VITALS — BP 140/90 | HR 82 | Ht 71.0 in | Wt 260.0 lb

## 2021-12-19 DIAGNOSIS — E785 Hyperlipidemia, unspecified: Secondary | ICD-10-CM

## 2021-12-19 DIAGNOSIS — E538 Deficiency of other specified B group vitamins: Secondary | ICD-10-CM

## 2021-12-19 DIAGNOSIS — E1165 Type 2 diabetes mellitus with hyperglycemia: Secondary | ICD-10-CM

## 2021-12-19 DIAGNOSIS — E1159 Type 2 diabetes mellitus with other circulatory complications: Secondary | ICD-10-CM

## 2021-12-19 DIAGNOSIS — G6289 Other specified polyneuropathies: Secondary | ICD-10-CM

## 2021-12-19 LAB — POCT GLYCOSYLATED HEMOGLOBIN (HGB A1C): Hemoglobin A1C: 7.7 % — AB (ref 4.0–5.6)

## 2021-12-19 MED ORDER — OZEMPIC (0.25 OR 0.5 MG/DOSE) 2 MG/1.5ML ~~LOC~~ SOPN: 0.5000 mg | PEN_INJECTOR | SUBCUTANEOUS | 3 refills | Status: DC

## 2021-12-19 MED ORDER — METFORMIN HCL 1000 MG PO TABS: 1000.0000 mg | ORAL_TABLET | Freq: Two times a day (BID) | ORAL | 3 refills | Status: DC

## 2021-12-19 NOTE — Patient Instructions (Addendum)
Please continue: ?- Metformin 1000 mg 2x a day with meals ?- Glipizide 5 mg in am and 5-10 mg before dinner ? ?Please start: ?- Ozempic 0.25 mg weekly in a.m. (for example on Sunday morning) x 2-4 weeks, then increase to 0.5 mg weekly in a.m. if no nausea or hypoglycemia. ? ?Please check some sugars later in the day, also. ? ?Please return in 3-4 months with your sugar log. ?

## 2021-12-19 NOTE — Progress Notes (Signed)
Patient ID: Charles [name], male   DOB: July 05, 1941, 81 y.o.   MRN: [id number] ? ?This visit occurred during the SARS-CoV-2 public health emergency.  Safety protocols were in place, including screening questions prior to the visit, additional usage of staff PPE, and extensive cleaning of exam room while observing appropriate contact time as indicated for disinfecting solutions.  ? ?HPI: ?Charles [name] is a 81 y.o.-year-old male, returning for f/u for DM2 dx 2008, uncontrolled, with complications (CAD - s/p NSTEMI, PN, mild CKD). Last visit 4 months ago. ? ?Interim history: ?No increased urination, blurry vision, nausea, chest pain. ?He would be interested in starting a medication that can help with weight loss. ? ?DM2: ?Reviewed HbA1c levels: ?Lab Results  ?Component Value Date  ? HGBA1C 7.5 (A) 08/20/2021  ? HGBA1C 6.9 (A) 05/01/2021  ? HGBA1C 7.3 (A) 12/19/2020  ?12/2013: 8.6% ? ?Pt is on a regimen of: ?- Metformin 1000 mg 2x a day >> 2000 mg with dinner (but sugars worse) >> 1000 mg 2x a day ?- Glipizide 10 >> 5 mg 2x a day before meals >> 5 mg in a.m. and 5-10 mg before dinner ?He did not start Ozempic 0.25 mg weekly -08/2020 >> too expensive. ?In the past, I recommended  NPH 14 units at bedtime, but he did not start. ?We tried Rybelsus 3 >> 6 mg daily-added 12/2018 >> $$$ >> stopped 01/2019 ?He checked with his insurance and none of the SGLT2 inhibitors are covered for him. ? ?Pt checks his sugars once a day: ?- am: 88-124 >> 104-181, 194 (pizza) >> 90s, 110-150, 200 >> 115-184 ?- 2h after b'fast: 68x1 >> n/c >> 201, 206 >> n/c ?- before lunch:  n/c >> 69, 79 >> n/c >> 90 x1 >> n/c ?- 2h after lunch: 123-176 >> n/c ?- before dinner  n/c >> 160s >> n/c >> 97 >> 90s >> ? ?- 2h after dinner: n/c >> 240 >> n/c ?- bedtime: 110-150 >> 160s >> n/c ?Lowest sugar was  71 >> 88 >> 97 >> 80s >> 100; he has hypoglycemia awareness in the 80s. ?Highest sugar was 218 >> 120s >> 194 >> 200 (forgot meds at night) >> 180 ? ?-+ Mild  CKD, last BUN/creatinine:  ?Lab Results  ?Component Value Date  ? BUN 18 10/09/2021  ? CREATININE 1.22 10/09/2021  ?Of note, he has a history of nephrectomy. ?On lisinopril 10. ? ?-+ HL; last set of lipids: ?Lab Results  ?Component Value Date  ? CHOL 148 10/09/2021  ? HDL 45.20 10/09/2021  ? [employer] 63 10/09/2021  ? LDLDIRECT 139.8 07/08/2014  ? TRIG 196.0 (H) 10/09/2021  ? CHOLHDL 3 10/09/2021  ?On Lipitor 40. ? ?- last eye exam was in 2022: No DR; now seeing Dr. [place] and Dr. Iona Hansen- retinal vein occlusion, + cataracts, + macular "hole".  He has a history of eye surgeries.  He gets intraocular injections ? ?- his numbness and tingling in feet improved after starting B12. Foot exam 09/2021. ? 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 ? ?B12 deficiency: ?-Diagnosed during investigation for numbness and tingling (peripheral neuropathy) ?-He was initially on B12 injections, then on 5000 mcg daily ?-He was on 5000 mcg every 3 days, changed by PCP 09/2019.  Afterwards, we switched to 1000 mcg every day, but when he ran out he started back on 5000  mcg and he takes this every 3 days again. ? ?Reviewed his B12 levels: ?Lab Results  ?Component Value Date  ? JSEGBTDV76 832 10/09/2021  ? HYWVPXTG62 900 10/06/2020  ? IRSWNIOE70 748 12/22/2019  ? VITAMINB12 1,033 (H) 10/08/2019  ? JJKKXFGH82 1,172 (H) 08/19/2018  ? VITAMINB12 >1500 (H) 05/04/2018  ? XHBZJIRC78 1,249 (H) 02/18/2017  ? LFYBOFBP10 234 10/22/2016  ? CHENIDPO24 217 04/19/2016  ? VITAMINB12 94 (L) 10/13/2015  ? ?He was admitted with NSTEMI 10/18/2015.  He had stents placed then. ? ?He was going to the gym 5 out of 7 days, but stopped during the coronavirus pandemic.  He then restarted to go to the gym. ? ?On tamsulosin. ? ?ROS: + See HPI ? ?I reviewed pt's medications, allergies, PMH, social hx, family hx, and changes were  documented in the history of present illness. Otherwise, unchanged from my initial visit note. ? ?Past Medical History:  ?Diagnosis Date  ? Anemia   ? "when I was a lad"  ? Arthritis   ? left and right arm  ? AVM (arteriovenous malformation) of colon   ? 2 - non-bleeding 2013  ? Cancer of kidney ([place]) 2000  ? left nephrectomy  ? Coronary artery disease   ? Dysrhythmia   ? PAF  ? Eczema   ? GERD (gastroesophageal reflux disease)   ? Hernia, ventral   ? History of blood transfusion   ? Hyperlipidemia   ? Hypertension   ? Myocardial infarction [facility])   ? Neuropathy   ? Obesity   ? Personal history of adenomatous colonic polyps 07/20/2012  ? 3 + adenomas 2009 07/20/2012 2 diminutive polyps    ? Pneumonia 1942; 1950s X 1  ? Type II diabetes mellitus ([place])   ? type II  ? Walking pneumonia 2000's X 1  ? ?Past Surgical History:  ?Procedure Laterality Date  ? CARDIAC CATHETERIZATION N/A 10/19/2015  ? Procedure: Left Heart Cath and Coronary Angiography;  Surgeon: Charolette Forward, MD;  Location: [place] [facility];  Service: Cardiovascular;  Laterality: N/A;  ? CARDIAC CATHETERIZATION N/A 10/19/2015  ? Procedure: Coronary Stent Intervention;  Surgeon: Charolette Forward, MD;  Location: [place] [facility];  Service: Cardiovascular;  Laterality: N/A;  ? CATARACT EXTRACTION, BILATERAL Bilateral 01/2018  ? Dr. Kathrin Penner did surgery and he developed MD and is seeing specialist now for MD treatment  ? COLONOSCOPY    ? "nothing showed up this time"  ? COLONOSCOPY W/ BIOPSIES AND POLYPECTOMY  X 1  ? CORONARY ANGIOPLASTY    ? EXCISION MASS NECK Left 01/08/2021  ? Procedure: EXCISION MASS NECK;  Surgeon: Melida Quitter, MD;  Location: [place];  Service: ENT;  Laterality: Left;  ? FEMUR IM NAIL Right 07/26/2013  ? Procedure: INTRAMEDULLARY (IM) NAIL FEMORAL subtrochanteric;  Surgeon: Mauri Pole, MD;  Location: [place];  Service: Orthopedics;  Laterality: Right;  ? FRACTURE SURGERY    ? INGUINAL HERNIA REPAIR Left >3 times  ? INGUINAL HERNIA REPAIR  Right 2012  ? NEPHRECTOMY Left 2000  ? TONSILLECTOMY  1940s  ? ?Social History  ? ?Socioeconomic History  ? Marital status: Widowed  ?  Spouse name: Not on file  ? Number of children: 3  ? Years of education: Not on file  ? Highest education level: Master's degree (e.g., MA, MS, MEng, MEd, MSW, MBA)  ?Occupational History  ? Occupation: retired  ?Tobacco Use  ? Smoking status: Former  ?  Years: 20.00  ?  Types: Cigarettes, Pipe,  Cigars  ?  Quit date: 09/09/1968  ?  Years since quitting: 53.3  ? Smokeless tobacco: Never  ? Tobacco comments:  ?  smoking cigars- quit in 2006  , quit pipe 2000  ?Vaping Use  ? Vaping Use: Never used  ?Substance and Sexual Activity  ? Alcohol use: Yes  ?  Alcohol/week: 2.0 standard drinks  ?  Types: 2 Glasses of wine per week  ?  Comment: 2 glasses on Tuesday  ? Drug use: No  ? Sexual activity: Never  ?Other Topics Concern  ? Not on file  ?Social History Narrative  ? Regular exercise: goes to the [facility] and walks  ? Caffeine use: daily; coffee  ? 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   ?  widowed   ? Three children, One in [place], one in [place] one in [place]  ? HH - 2 : youngest daughter lives with him  ? ?Social Determinants of Health  ? ?Financial Resource Strain: Low Risk   ? Difficulty of Paying Living Expenses: Not hard at all  ?Food Insecurity: No Food Insecurity  ? Worried About Charity fundraiser in the Last Year: Never true  ? Ran Out of Food in the Last Year: Never true  ?Transportation Needs: No Transportation Needs  ? Lack of Transportation (Medical): No  ? Lack of Transportation (Non-Medical): No  ?Physical Activity: Sufficiently Active  ? Days of Exercise per Week: 5 days  ? Minutes of Exercise per Session: 70 min  ?Stress: No Stress Concern Present  ? Feeling of Stress : Not at all  ?Social Connections: Moderately Isolated  ? Frequency of Communication with Friends and Family: More than three times a week  ? Frequency of Social Gatherings with Friends and  Family: More than three times a week  ? Attends Religious Services: More than 4 times per year  ? Active Member of Clubs or Organizations: No  ? Attends Archivist Meetings: Never  ? Marital Status: Wi

## 2021-12-26 ENCOUNTER — Ambulatory Visit: Payer: Medicare Other

## 2021-12-26 ENCOUNTER — Telehealth (INDEPENDENT_AMBULATORY_CARE_PROVIDER_SITE_OTHER): Payer: Medicare Other

## 2021-12-26 VITALS — Ht 71.0 in | Wt 260.0 lb

## 2021-12-26 DIAGNOSIS — I480 Paroxysmal atrial fibrillation: Secondary | ICD-10-CM | POA: Diagnosis not present

## 2021-12-26 DIAGNOSIS — Z0000 Encounter for general adult medical examination without abnormal findings: Secondary | ICD-10-CM

## 2021-12-26 DIAGNOSIS — E785 Hyperlipidemia, unspecified: Secondary | ICD-10-CM | POA: Diagnosis not present

## 2021-12-26 DIAGNOSIS — E119 Type 2 diabetes mellitus without complications: Secondary | ICD-10-CM | POA: Diagnosis not present

## 2021-12-26 DIAGNOSIS — I10 Essential (primary) hypertension: Secondary | ICD-10-CM | POA: Diagnosis not present

## 2021-12-26 NOTE — Patient Instructions (Addendum)
?Charles [name] , ?Thank you for taking time to come for your Medicare Wellness Visit. I appreciate your ongoing commitment to your health goals. Please review the following plan we discussed and let me know if I can assist you in the future.  ? ?These are the goals we discussed: ? Goals   ? ?   Patient Stated   ?   Stay around for a while  ?  ?   Weight (lb) < 200 lb (90.7 kg) (pt-stated)   ?   Weight off. ?  ?   Weight (lb) < 230 lb (104.3 kg)   ?   Watch your portion sizes  ?  ? ?  ?  ?This is a list of the screening recommended for you and due dates:  ?Health Maintenance  ?Topic Date Due  ? Eye exam for diabetics  01/02/2021  ? COVID-19 Vaccine (4 - Booster for Pfizer series) 01/11/2022*  ? Zoster (Shingles) Vaccine (2 of 2) 03/27/2022*  ? Tetanus Vaccine  02/19/2022  ? Flu Shot  04/09/2022  ? Hemoglobin A1C  06/20/2022  ? Complete foot exam   10/09/2022  ? Pneumonia Vaccine  Completed  ? HPV Vaccine  Aged Out  ? Colon Cancer Screening  Discontinued  ?*Topic was postponed. The date shown is not the original due date.  ? ?Advanced directives: Yes Patient will bring copy ? ?Conditions/risks identified: None ? ?Next appointment: Follow up in one year for your annual wellness visit.  ? ?Preventive Care 45 Years and Older, Male ?Preventive care refers to lifestyle choices and visits with your health care provider that can promote health and wellness. ?What does preventive care include? ?A yearly physical exam. This is also called an annual well check. ?Dental exams once or twice a year. ?Routine eye exams. Ask your health care provider how often you should have your eyes checked. ?Personal lifestyle choices, including: ?Daily care of your teeth and gums. ?Regular physical activity. ?Eating a healthy diet. ?Avoiding tobacco and drug use. ?Limiting alcohol use. ?Practicing safe sex. ?Taking low doses of aspirin every day. ?Taking vitamin and mineral supplements as recommended by your health care provider. ?What happens  during an annual well check? ?The services and screenings done by your health care provider during your annual well check will depend on your age, overall health, lifestyle risk factors, and family history of disease. ?Counseling  ?Your health care provider may ask you questions about your: ?Alcohol use. ?Tobacco use. ?Drug use. ?Emotional well-being. ?Home and relationship well-being. ?Sexual activity. ?Eating habits. ?History of falls. ?Memory and ability to understand (cognition). ?Work and work Statistician. ?Screening  ?You may have the following tests or measurements: ?Height, weight, and BMI. ?Blood pressure. ?Lipid and cholesterol levels. These may be checked every 5 years, or more frequently if you are over 70 years old. ?Skin check. ?Lung cancer screening. You may have this screening every year starting at age 77 if you have a 30-pack-year history of smoking and currently smoke or have quit within the past 15 years. ?Fecal occult blood test (FOBT) of the stool. You may have this test every year starting at age 9. ?Flexible sigmoidoscopy or colonoscopy. You may have a sigmoidoscopy every 5 years or a colonoscopy every 10 years starting at age 8. ?Prostate cancer screening. Recommendations will vary depending on your family history and other risks. ?Hepatitis C blood test. ?Hepatitis B blood test. ?Sexually transmitted disease (STD) testing. ?Diabetes screening. This is done by checking your blood sugar (glucose)  after you have not eaten for a while (fasting). You may have this done every 1-3 years. ?Abdominal aortic aneurysm (AAA) screening. You may need this if you are a current or former smoker. ?Osteoporosis. You may be screened starting at age 80 if you are at high risk. ?Talk with your health care provider about your test results, treatment options, and if necessary, the need for more tests. ?Vaccines  ?Your health care provider may recommend certain vaccines, such as: ?Influenza vaccine. This is  recommended every year. ?Tetanus, diphtheria, and acellular pertussis (Tdap, Td) vaccine. You may need a Td booster every 10 years. ?Zoster vaccine. You may need this after age 19. ?Pneumococcal 13-valent conjugate (PCV13) vaccine. One dose is recommended after age 38. ?Pneumococcal polysaccharide (PPSV23) vaccine. One dose is recommended after age 39. ?Talk to your health care provider about which screenings and vaccines you need and how often you need them. ?This information is not intended to replace advice given to you by your health care provider. Make sure you discuss any questions you have with your health care provider. ?Document Released: 09/22/2015 Document Revised: 05/15/2016 Document Reviewed: 06/27/2015 ?Elsevier Interactive Patient Education ? 2017 [place]. ? ?Fall Prevention in the Home ?Falls can cause injuries. They can happen to people of all ages. There are many things you can do to make your home safe and to help prevent falls. ?What can I do on the outside of my home? ?Regularly fix the edges of walkways and driveways and fix any cracks. ?Remove anything that might make you trip as you walk through a door, such as a raised step or threshold. ?Trim any bushes or trees on the path to your home. ?Use bright outdoor lighting. ?Clear any walking paths of anything that might make someone trip, such as rocks or tools. ?Regularly check to see if handrails are loose or broken. Make sure that both sides of any steps have handrails. ?Any raised decks and porches should have guardrails on the edges. ?Have any leaves, snow, or ice cleared regularly. ?Use sand or salt on walking paths during winter. ?Clean up any spills in your garage right away. This includes oil or grease spills. ?What can I do in the bathroom? ?Use night lights. ?Install grab bars by the toilet and in the tub and shower. Do not use towel bars as grab bars. ?Use non-skid mats or decals in the tub or shower. ?If you need to sit down in  the shower, use a plastic, non-slip stool. ?Keep the floor dry. Clean up any water that spills on the floor as soon as it happens. ?Remove soap buildup in the tub or shower regularly. ?Attach bath mats securely with double-sided non-slip rug tape. ?Do not have throw rugs and other things on the floor that can make you trip. ?What can I do in the bedroom? ?Use night lights. ?Make sure that you have a light by your bed that is easy to reach. ?Do not use any sheets or blankets that are too big for your bed. They should not hang down onto the floor. ?Have a firm chair that has side arms. You can use this for support while you get dressed. ?Do not have throw rugs and other things on the floor that can make you trip. ?What can I do in the kitchen? ?Clean up any spills right away. ?Avoid walking on wet floors. ?Keep items that you use a lot in easy-to-reach places. ?If you need to reach something above you, use a  strong step stool that has a grab bar. ?Keep electrical cords out of the way. ?Do not use floor polish or wax that makes floors slippery. If you must use wax, use non-skid floor wax. ?Do not have throw rugs and other things on the floor that can make you trip. ?What can I do with my stairs? ?Do not leave any items on the stairs. ?Make sure that there are handrails on both sides of the stairs and use them. Fix handrails that are broken or loose. Make sure that handrails are as long as the stairways. ?Check any carpeting to make sure that it is firmly attached to the stairs. Fix any carpet that is loose or worn. ?Avoid having throw rugs at the top or bottom of the stairs. If you do have throw rugs, attach them to the floor with carpet tape. ?Make sure that you have a light switch at the top of the stairs and the bottom of the stairs. If you do not have them, ask someone to add them for you. ?What else can I do to help prevent falls? ?Wear shoes that: ?Do not have high heels. ?Have rubber bottoms. ?Are comfortable  and fit you well. ?Are closed at the toe. Do not wear sandals. ?If you use a stepladder: ?Make sure that it is fully opened. Do not climb a closed stepladder. ?Make sure that both sides of the stepladder are l

## 2021-12-26 NOTE — Progress Notes (Signed)
? ?Subjective:  ? Charles [name] is a 81 y.o. male who presents for Medicare Annual/Subsequent preventive examination. ? ?Review of Systems    ?Virtual Visit via Telephone Note ? ?I connected with  Charles [name] on 12/26/21 at  9:00 AM EDT by telephone and verified that I am speaking with the correct person using two identifiers. ? ?Location: ?Patient: Home ?Provider: Office ?Persons participating in the virtual visit: patient/Nurse Health Advisor ?  ?I discussed the limitations, risks, security and privacy concerns of performing an evaluation and management service by telephone and the availability of in person appointments. The patient expressed understanding and agreed to proceed. ? ?Interactive audio and video telecommunications were attempted between this nurse and patient, however failed, due to patient having technical difficulties OR patient did not have access to video capability.  We continued and completed visit with audio only. ? ?Some vital signs may be absent or patient reported.  ? ?Charles Peaches, LPN  ?Cardiac Risk Factors include: advanced age (>24mn, >>50women);diabetes mellitus;male gender;hypertension ? ?   ?Objective:  ?  ?Today's Vitals  ? 12/26/21 0851  ?Weight: 260 lb (117.9 kg)  ?Height: '5\' 11"'$  (1.803 m)  ? ?Body mass index is 36.26 kg/m?. ? ? ?  12/26/2021  ?  9:06 AM 01/25/2021  ? 10:14 AM 01/08/2021  ?  8:10 AM 12/20/2020  ?  9:06 AM 10/18/2020  ?  9:53 AM 08/25/2020  ?  1:55 PM 10/12/2019  ?  8:38 AM  ?Advanced Directives  ?Does Patient Have a Medical Advance Directive? Yes Yes Yes Yes Yes Yes Yes  ?Type of [place] will [place] will Healthcare Power of [place] will Healthcare Power of [place]Living will  ?Does patient want to make changes to medical advance directive? No - Patient declined No - Patient declined   No -  Patient declined No - Patient declined No - Patient declined  ?Copy of [place]in Chart? No - copy requested No - copy requested  No - copy requested No - copy requested  No - copy requested  ? ? ?Current Medications (verified) ?Outpatient Encounter Medications as of 12/26/2021  ?Medication Sig  ? aspirin 81 MG chewable tablet Chew 81 mg by mouth in the morning.  ? atorvastatin (LIPITOR) 40 MG tablet Take 1 tablet (40 mg total) by mouth daily at 6 PM.  ? Cholecalciferol (VITAMIN D-3 PO) Take 5,000 Units by mouth in the morning.  ? Cyanocobalamin (VITAMIN B-12) 5000 MCG SUBL Place 5,000 mcg under the tongue every 3 (three) days.  ? ELIQUIS 5 MG TABS tablet Take 5 mg by mouth 2 (two) times daily.  ? EPINEPHrine (EPI-PEN) 0.3 mg/0.3 mL DEVI Inject 0.3 mg into the muscle daily as needed (allergic reaction).  ? gabapentin (NEURONTIN) 100 MG capsule TAKE 1 CAPSULE BY MOUTH AT  BEDTIME (Patient taking differently: Take 100 mg by mouth at bedtime.)  ? glipiZIDE (GLUCOTROL) 5 MG tablet TAKE 1 TABLET BY MOUTH  TWICE DAILY BEFORE A MEAL  ? Insulin Syringes, Disposable, U-100 0.5 ML MISC Use 1x a day  ? lisinopril (ZESTRIL) 20 MG tablet Take 20 mg by mouth in the morning.  ? metFORMIN (GLUCOPHAGE) 1000 MG tablet Take 1 tablet (1,000 mg total) by mouth 2 (two) times daily with a meal.  ? metoprolol succinate (TOPROL-XL) 100 MG 24 hr tablet Take 50-100 mg by  mouth See admin instructions. Take 1 tablet (100 mg) gy mouth in the morning & take 0.5 tablet (50 mg) by mouth in the evening.  ? Multiple Vitamins-Minerals (PRESERVISION AREDS 2 PO) Take 1 tablet by mouth in the morning and at bedtime.  ? nitroGLYCERIN (NITROSTAT) 0.4 MG SL tablet Place 1 tablet (0.4 mg total) under the tongue every 5 (five) minutes as needed for chest pain.  ? ONETOUCH DELICA LANCETS FINE MISC Use to check sugar daily  ? ONETOUCH VERIO test strip USE 1 STRIP TO CHECK GLUCOSE ONCE DAILY  ? Semaglutide,0.25 or 0.'5MG'$ /DOS, (OZEMPIC, 0.25 OR  0.5 MG/DOSE,) 2 MG/1.5ML SOPN Inject 0.5 mg into the skin once a week.  ? spironolactone (ALDACTONE) 25 MG tablet Take 25 mg by mouth in the morning.  ? tamsulosin (FLOMAX) 0.4 MG CAPS capsule TAKE 1 CAPSULE BY MOUTH  DAILY  ? ?No facility-administered encounter medications on file as of 12/26/2021.  ? ? ?Allergies (verified) ?Bee venom  ? ?History: ?Past Medical History:  ?Diagnosis Date  ? Anemia   ? "when I was a lad"  ? Arthritis   ? left and right arm  ? AVM (arteriovenous malformation) of colon   ? 2 - non-bleeding 2013  ? Cancer of kidney ([place]) 2000  ? left nephrectomy  ? Coronary artery disease   ? Dysrhythmia   ? PAF  ? Eczema   ? GERD (gastroesophageal reflux disease)   ? Hernia, ventral   ? History of blood transfusion   ? Hyperlipidemia   ? Hypertension   ? Myocardial infarction [facility])   ? Neuropathy   ? Obesity   ? Personal history of adenomatous colonic polyps 07/20/2012  ? 3 + adenomas 2009 07/20/2012 2 diminutive polyps    ? Pneumonia 1942; 1950s X 1  ? Type II diabetes mellitus ([place])   ? type II  ? Walking pneumonia 2000's X 1  ? ?Past Surgical History:  ?Procedure Laterality Date  ? CARDIAC CATHETERIZATION N/A 10/19/2015  ? Procedure: Left Heart Cath and Coronary Angiography;  Surgeon: Charolette Forward, MD;  Location: [facility];  Service: Cardiovascular;  Laterality: N/A;  ? CARDIAC CATHETERIZATION N/A 10/19/2015  ? Procedure: Coronary Stent Intervention;  Surgeon: Charolette Forward, MD;  Location: [facility];  Service: Cardiovascular;  Laterality: N/A;  ? CATARACT EXTRACTION, BILATERAL Bilateral 01/2018  ? Dr. Kathrin Penner did surgery and he developed MD and is seeing specialist now for MD treatment  ? COLONOSCOPY    ? "nothing showed up this time"  ? COLONOSCOPY W/ BIOPSIES AND POLYPECTOMY  X 1  ? CORONARY ANGIOPLASTY    ? EXCISION MASS NECK Left 01/08/2021  ? Procedure: EXCISION MASS NECK;  Surgeon: Melida Quitter, MD;  Location: [facility];  Service: ENT;  Laterality: Left;  ? FEMUR IM NAIL Right  07/26/2013  ? Procedure: INTRAMEDULLARY (IM) NAIL FEMORAL subtrochanteric;  Surgeon: Mauri Pole, MD;  Location: [facility];  Service: Orthopedics;  Laterality: Right;  ? FRACTURE SURGERY    ? INGUINAL HERNIA REPAIR Left >3 times  ? INGUINAL HERNIA REPAIR Right 2012  ? NEPHRECTOMY Left 2000  ? TONSILLECTOMY  1940s  ? ?Family History  ?Problem Relation Age of Onset  ? Drug abuse Other   ? Cancer Other   ? Heart disease Other   ? Lung disease Other   ? Diabetes Mother   ? Diabetes Sister   ? Colon cancer Neg Hx   ? Stomach cancer Neg Hx   ? ?  Social History  ? ?Socioeconomic History  ? Marital status: Widowed  ?  Spouse name: Not on file  ? Number of children: 3  ? Years of education: Not on file  ? Highest education level: Master's degree (e.g., MA, MS, MEng, MEd, MSW, MBA)  ?Occupational History  ? Occupation: retired  ?Tobacco Use  ? Smoking status: Former  ?  Years: 20.00  ?  Types: Cigarettes, Pipe, Cigars  ?  Quit date: 09/09/1968  ?  Years since quitting: 53.3  ? Smokeless tobacco: Never  ? Tobacco comments:  ?  smoking cigars- quit in 2006  , quit pipe 2000  ?Vaping Use  ? Vaping Use: Never used  ?Substance and Sexual Activity  ? Alcohol use: Not Currently  ?  Alcohol/week: 2.0 standard drinks  ?  Types: 2 Glasses of wine per week  ?  Comment: 2 glasses on Tuesday  ? Drug use: No  ? Sexual activity: Never  ?Other Topics Concern  ? Not on file  ?Social History Narrative  ? Regular exercise: goes to the [facility] and walks  ? Caffeine use: daily; coffee  ? 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   ?  widowed   ? Three children, One in [place], one in [place] one in [place]  ? HH - 2 : youngest daughter lives with him  ? ?Social Determinants of Health  ? ?Financial Resource Strain: Low Risk   ? Difficulty of Paying Living Expenses: Not hard at all  ?Food Insecurity: No Food Insecurity  ? Worried About Charity fundraiser in the Last Year: Never true  ? Ran Out of Food in the Last Year: Never true   ?Transportation Needs: No Transportation Needs  ? Lack of Transportation (Medical): No  ? Lack of Transportation (Non-Medical): No  ?Physical Activity: Sufficiently Active  ? Days of Exercise per Week: 5 days  ? Minute

## 2021-12-28 ENCOUNTER — Ambulatory Visit (INDEPENDENT_AMBULATORY_CARE_PROVIDER_SITE_OTHER): Payer: Medicare Other

## 2021-12-28 DIAGNOSIS — I10 Essential (primary) hypertension: Secondary | ICD-10-CM

## 2021-12-28 DIAGNOSIS — I4891 Unspecified atrial fibrillation: Secondary | ICD-10-CM

## 2021-12-28 DIAGNOSIS — E785 Hyperlipidemia, unspecified: Secondary | ICD-10-CM

## 2021-12-28 DIAGNOSIS — E1165 Type 2 diabetes mellitus with hyperglycemia: Secondary | ICD-10-CM

## 2021-12-28 NOTE — Chronic Care Management (AMB) (Signed)
?Chronic Care Management  ? ?CCM RN Visit Note ? ?12/28/2021 ?Name: Charles [name] MRN: [id number] DOB: 1940/12/12 ? ?Subjective: ?Charles [name] is a 81 y.o. year old male who is a primary care patient of [name], NP. The care management team was consulted for assistance with disease management and care coordination needs.   ? ?Engaged with patient by telephone for follow up visit in response to provider referral for case management and/or care coordination services.  ? ?Consent to Services:  ?The patient was given information about Chronic Care Management services, agreed to services, and gave verbal consent prior to initiation of services.  Please see initial visit note for detailed documentation.  ? ?Patient agreed to services and verbal consent obtained.  ? ?Assessment: Review of patient past medical history, allergies, medications, health status, including review of consultants reports, laboratory and other test data, was performed as part of comprehensive evaluation and provision of chronic care management services.  ? ?SDOH (Social Determinants of Health) assessments and interventions performed:   ? ?CCM Care Plan ? ?Allergies  ?Allergen Reactions  ? Bee Venom Anaphylaxis  ? ? ?Outpatient Encounter Medications as of 12/28/2021  ?Medication Sig  ? amiodarone (PACERONE) 200 MG tablet Take 200 mg by mouth 2 (two) times daily.  ? aspirin 81 MG chewable tablet Chew 81 mg by mouth in the morning.  ? atorvastatin (LIPITOR) 40 MG tablet Take 1 tablet (40 mg total) by mouth daily at 6 PM.  ? Cholecalciferol (VITAMIN D-3 PO) Take 5,000 Units by mouth in the morning.  ? Cyanocobalamin (VITAMIN B-12) 5000 MCG SUBL Place 5,000 mcg under the tongue every 3 (three) days.  ? ELIQUIS 5 MG TABS tablet Take 5 mg by mouth 2 (two) times daily.  ? EPINEPHrine (EPI-PEN) 0.3 mg/0.3 mL DEVI Inject 0.3 mg into the muscle daily as needed (allergic reaction).  ? gabapentin (NEURONTIN) 100 MG capsule TAKE 1 CAPSULE BY MOUTH AT   BEDTIME (Patient taking differently: Take 100 mg by mouth at bedtime.)  ? glipiZIDE (GLUCOTROL) 5 MG tablet TAKE 1 TABLET BY MOUTH  TWICE DAILY BEFORE A MEAL  ? Insulin Syringes, Disposable, U-100 0.5 ML MISC Use 1x a day  ? lisinopril (ZESTRIL) 20 MG tablet Take 20 mg by mouth in the morning. (Patient not taking: Reported on 12/28/2021)  ? metFORMIN (GLUCOPHAGE) 1000 MG tablet Take 1 tablet (1,000 mg total) by mouth 2 (two) times daily with a meal.  ? metoprolol succinate (TOPROL-XL) 100 MG 24 hr tablet Take 50-100 mg by mouth See admin instructions. Take 1 tablet (100 mg) gy mouth in the morning & take 0.5 tablet (50 mg) by mouth in the evening.  ? Multiple Vitamins-Minerals (PRESERVISION AREDS 2 PO) Take 1 tablet by mouth in the morning and at bedtime.  ? nitroGLYCERIN (NITROSTAT) 0.4 MG SL tablet Place 1 tablet (0.4 mg total) under the tongue every 5 (five) minutes as needed for chest pain.  ? ONETOUCH DELICA LANCETS FINE MISC Use to check sugar daily  ? ONETOUCH VERIO test strip USE 1 STRIP TO CHECK GLUCOSE ONCE DAILY  ? Semaglutide,0.25 or 0.'5MG'$ /DOS, (OZEMPIC, 0.25 OR 0.5 MG/DOSE,) 2 MG/1.5ML SOPN Inject 0.5 mg into the skin once a week.  ? spironolactone (ALDACTONE) 25 MG tablet Take 25 mg by mouth in the morning.  ? tamsulosin (FLOMAX) 0.4 MG CAPS capsule TAKE 1 CAPSULE BY MOUTH  DAILY  ? ?No facility-administered encounter medications on file as of 12/28/2021.  ? ? ?Patient Active Problem List  ?  Diagnosis Date Noted  ? Acquired trigger finger of right index finger 05/23/2021  ? Cellulitis 03/22/2021  ? Neck mass 01/08/2021  ? Atrial fibrillation ([place]) 10/25/2020  ? Parotid mass 10/25/2020  ? Epistaxis 09/29/2018  ? Hyperkalemia 09/29/2018  ? AKI (acute kidney injury) ([place]) 09/29/2018  ? Lumbar radiculopathy 08/06/2017  ? Greater trochanteric bursitis of left hip 07/04/2017  ? Rotator cuff arthropathy of left shoulder 01/24/2017  ? AC (acromioclavicular) arthritis 01/24/2017  ? Vitamin B12 deficiency  01/18/2016  ? NSTEMI (non-ST elevated myocardial infarction) ([place]) 10/19/2015  ? Acute non Q wave MI (myocardial infarction), initial episode of care ([place]) 10/19/2015  ? Peripheral neuropathy 10/13/2015  ? 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[place]) 07/14/2015  ? Actinic keratosis of right cheek 10/04/2014  ? Contact dermatitis and eczema 02/10/2014  ? 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 Acute blood loss anemia 08/27/2013  ? Insomnia 08/27/2013  ? Hip fracture, right ([place]) 07/25/2013  ? 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  ? Renal cell carcinoma ([place]) 07/25/2013  ? History of colonic polyps 07/20/2012  ? Hyperlipidemia 11/13/2007  ? Obesity 11/13/2007  ? Essential hypertension 04/23/2007  ? ? ?Conditions to be addressed/monitored:Atrial Fibrillation, HTN, HLD, and DMII ? ?Care Plan : RN Care Manager Plan of Care  ?Updates made by Dimitri Ped, RN since 12/28/2021 12:00 AM  ?  ? ?Problem: Chronic Disease Management and Care Coordination Needs (DM. Afib,HTN and HLD)   ?Priority: High  ?  ? ?Long-Range Goal: Establish Plan of Care for Chronic Disease Management Needs (DM. Afib,HTN and HLD)   ?Start Date: 08/24/2021  ?Expected End Date: 12/28/2022  ?Priority: High  ?Note:   ?Current Barriers:  ?Knowledge Deficits related to plan of care for management of Atrial Fibrillation, HTN, HLD, and DMII  ?Chronic Disease Management support and education needs related to Atrial Fibrillation, HTN, HLD, and DMII  ?States he saw Dr.Gherghe on 12/19/21 and she is starting him on Ozempic.  States he will take his first dose on Sunday.  States his fasting readings range from 110-150. Denies any hypoglycemia.  States he sometimes forgets his evening medications even though he uses a pill box. States he occasionally eats  sweets  and has pizza sometimes but he is now trying to eat less.  States he is still going to gym 5 days a week walking 1-2 miles and working on IT trainer.  Deneis any chest pains, shortness of breath or swelling.  States his B/P is good when [facility] checks at [facility].  States he saw cardiology 12/26/21 and he was in Atrial fib.  States he had no symptoms of being out of rhythm.  States he started him on Amiodarone and stopped his lisinopril. States he  will go back in 2 weeks to see him. States he had his second shingles shot on 11/24/21. ? ?RNCM Clinical Goal(s):  ?Patient will verbalize understanding of plan for management of Atrial Fibrillation, HTN, HLD, and DMII as evidenced by voiced adherence to plan of care ?verbalize basic understanding of  Atrial Fibrillation, HTN, HLD, and DMII disease process and self health management plan as evidenced by voiced understanding and teach back ?take all medications exactly as prescribed and will call provider for medication related questions as evidenced by dispense report and pt verbalization ?attend all scheduled medical appointments: Annual wellness visit 12/30/22, CCM PharmD 05/28/22, endocrinology 04/24/22, cardiology early May as evidenced by medical records ?demonstrate Improved adherence to prescribed treatment plan for Atrial Fibrillation, HTN,  HLD, and DMII as evidenced by readings within limits, voiced adherence to plan of care ?continue to work with RN Care Manager to address care management and care coordination needs related to  Atrial Fibrillation, HTN, HLD, and DMII as evidenced by adherence to CM Team Scheduled appointments through collaboration with RN Care manager, provider, and care team.  ? ?Interventions: ?1:1 collaboration with primary care provider regarding development and update of comprehensive plan of care as evidenced by provider attestation and co-signature ?Inter-disciplinary care team collaboration (see longitudinal plan of care) ?Evaluation of current treatment plan related to  self management and patient's adherence to plan as established by provider ? ? ?Health Maintenance Interventions: ? (Status:   Goal on track:  Yes.) Long Term Goal ?Patient interviewed about adult health maintenance status including  Cholesterol/Lipid screen      ?Diabetes Eye Exam    ?Shingrix  ?Advised patient to discuss  Cholesterol/Lipi

## 2021-12-28 NOTE — Patient Instructions (Signed)
Visit Information ? ?Thank you for taking time to visit with me today. Please don't hesitate to contact me if I can be of assistance to you before our next scheduled telephone appointment. ? ?Following are the goals we discussed today:  ?Take all medications as prescribed ?Attend all scheduled provider appointments ?Call pharmacy for medication refills 3-7 days in advance of running out of medications ?Perform all self care activities independently  ?Perform IADL's (shopping, preparing meals, housekeeping, managing finances) independently ?Call provider office for new concerns or questions  ?keep appointment with eye doctor ?check blood sugar at prescribed times: once daily and when you have symptoms of low or high blood sugar ?check feet daily for cuts, sores or redness ?take the blood sugar log to all doctor visits ?drink 6 to 8 glasses of water each day ?fill half of plate with vegetables ?limit fast food meals to no more than 1 per week ?manage portion size ?wash and dry feet carefully every day ?check pulse (heart) rate once a day ?make a plan to exercise regularly ?make a plan to eat healthy ?take medicine as prescribed ?check blood pressure 3 times per week ?choose a place to take my blood pressure (home, clinic or office, retail store) ?take blood pressure log to all doctor appointments ?call doctor for signs and symptoms of high blood pressure ?keep all doctor appointments ?eat more whole grains, fruits and vegetables, lean meats and healthy fats ?limit salt intake to '2300mg'$ /day ?call for medicine refill 2 or 3 days before it runs out ?take all medications exactly as prescribed ?call doctor with any symptoms you believe are related to your medicine ? ?Our next appointment is by telephone on 02/15/22 at 9 AM ? ?Please call the care guide team at [phone] if you need to cancel or reschedule your appointment.  ? ?If you are experiencing a Mental Health or [place] or need someone to talk to,  please call the Suicide and Crisis Lifeline: 988 ?call the [place] National Suicide Prevention Lifeline: 564-192-9732 or TTY: 340 428 0202 TTY (714)825-4661) to talk to a trained counselor ?call 1-800-273-TALK (toll free, 24 hour hotline) ?go to [facility] [address] [place] [phone]) ?call 911  ? ?Patient verbalizes understanding of instructions and care plan provided today and agrees to view in [place]. Active MyChart status confirmed with patient.   ?Peter Garter RN, BSN,CCM, CDE ?Care Management Coordinator ?[facility] ?[phone]   ?

## 2022-01-01 ENCOUNTER — Other Ambulatory Visit: Payer: Self-pay | Admitting: Family Medicine

## 2022-01-06 DIAGNOSIS — I10 Essential (primary) hypertension: Secondary | ICD-10-CM

## 2022-01-06 DIAGNOSIS — I4891 Unspecified atrial fibrillation: Secondary | ICD-10-CM

## 2022-01-06 DIAGNOSIS — E1159 Type 2 diabetes mellitus with other circulatory complications: Secondary | ICD-10-CM

## 2022-01-06 DIAGNOSIS — E1165 Type 2 diabetes mellitus with hyperglycemia: Secondary | ICD-10-CM

## 2022-01-06 DIAGNOSIS — E785 Hyperlipidemia, unspecified: Secondary | ICD-10-CM

## 2022-01-11 DIAGNOSIS — E785 Hyperlipidemia, unspecified: Secondary | ICD-10-CM | POA: Diagnosis not present

## 2022-01-11 DIAGNOSIS — I2510 Atherosclerotic heart disease of native coronary artery without angina pectoris: Secondary | ICD-10-CM | POA: Diagnosis not present

## 2022-01-11 DIAGNOSIS — I10 Essential (primary) hypertension: Secondary | ICD-10-CM | POA: Diagnosis not present

## 2022-01-11 DIAGNOSIS — I4891 Unspecified atrial fibrillation: Secondary | ICD-10-CM | POA: Diagnosis not present

## 2022-01-11 DIAGNOSIS — I4892 Unspecified atrial flutter: Secondary | ICD-10-CM | POA: Diagnosis not present

## 2022-01-30 ENCOUNTER — Encounter: Payer: Self-pay | Admitting: Internal Medicine

## 2022-01-30 DIAGNOSIS — E1159 Type 2 diabetes mellitus with other circulatory complications: Secondary | ICD-10-CM

## 2022-01-31 DIAGNOSIS — E113293 Type 2 diabetes mellitus with mild nonproliferative diabetic retinopathy without macular edema, bilateral: Secondary | ICD-10-CM | POA: Diagnosis not present

## 2022-01-31 DIAGNOSIS — H353133 Nonexudative age-related macular degeneration, bilateral, advanced atrophic without subfoveal involvement: Secondary | ICD-10-CM | POA: Diagnosis not present

## 2022-01-31 DIAGNOSIS — H34811 Central retinal vein occlusion, right eye, with macular edema: Secondary | ICD-10-CM | POA: Diagnosis not present

## 2022-01-31 DIAGNOSIS — H35372 Puckering of macula, left eye: Secondary | ICD-10-CM | POA: Diagnosis not present

## 2022-01-31 MED ORDER — PEN NEEDLES 32G X 4 MM MISC: 1 refills | Status: DC

## 2022-02-05 DIAGNOSIS — L820 Inflamed seborrheic keratosis: Secondary | ICD-10-CM | POA: Diagnosis not present

## 2022-02-05 DIAGNOSIS — L570 Actinic keratosis: Secondary | ICD-10-CM | POA: Diagnosis not present

## 2022-02-05 LAB — HM DIABETES EYE EXAM

## 2022-02-06 DIAGNOSIS — E11319 Type 2 diabetes mellitus with unspecified diabetic retinopathy without macular edema: Secondary | ICD-10-CM | POA: Diagnosis not present

## 2022-02-06 LAB — HM DIABETES EYE EXAM

## 2022-02-13 ENCOUNTER — Telehealth: Payer: Self-pay | Admitting: Pharmacist

## 2022-02-13 NOTE — Chronic Care Management (AMB) (Signed)
Chronic Care Management Pharmacy Assistant   Name: Charles [name]  MRN: [id number] DOB: 01-Aug-1941  Reason for Encounter: Disease State / Diabetes Assessment Call   Conditions to be addressed/monitored: DMII  Recent office visits:  12/26/2021 Rolene Arbour LPN - Medicare annual wellness exam  Recent consult visits:  12/19/2021 Philemon Kingdom MD (internal med) - Patient was seen for Poorly controlled type 2 diabetes mellitus with circulatory disorder and additional issues. Started Ozempic 0.5 mg SQ weekly.  Follow up in 3-4 months.   Hospital visits:  None  Medications: Outpatient Encounter Medications as of 02/13/2022  Medication Sig   amiodarone (PACERONE) 200 MG tablet Take 200 mg by mouth 2 (two) times daily.   aspirin 81 MG chewable tablet Chew 81 mg by mouth in the morning.   atorvastatin (LIPITOR) 40 MG tablet Take 1 tablet (40 mg total) by mouth daily at 6 PM.   Cholecalciferol (VITAMIN D-3 PO) Take 5,000 Units by mouth in the morning.   Cyanocobalamin (VITAMIN B-12) 5000 MCG SUBL Place 5,000 mcg under the tongue every 3 (three) days.   ELIQUIS 5 MG TABS tablet Take 5 mg by mouth 2 (two) times daily.   EPINEPHrine (EPI-PEN) 0.3 mg/0.3 mL DEVI Inject 0.3 mg into the muscle daily as needed (allergic reaction).   gabapentin (NEURONTIN) 100 MG capsule TAKE 1 CAPSULE BY MOUTH AT  BEDTIME   glipiZIDE (GLUCOTROL) 5 MG tablet TAKE 1 TABLET BY MOUTH  TWICE DAILY BEFORE A MEAL   Insulin Pen Needle (PEN NEEDLES) 32G X 4 MM MISC Use as instructed to administer weekly injection.   Insulin Syringes, Disposable, U-100 0.5 ML MISC Use 1x a day   lisinopril (ZESTRIL) 20 MG tablet Take 20 mg by mouth in the morning. (Patient not taking: Reported on 12/28/2021)   metFORMIN (GLUCOPHAGE) 1000 MG tablet Take 1 tablet (1,000 mg total) by mouth 2 (two) times daily with a meal.   metoprolol succinate (TOPROL-XL) 100 MG 24 hr tablet Take 50-100 mg by mouth See admin instructions. Take 1 tablet  (100 mg) gy mouth in the morning & take 0.5 tablet (50 mg) by mouth in the evening.   Multiple Vitamins-Minerals (PRESERVISION AREDS 2 PO) Take 1 tablet by mouth in the morning and at bedtime.   nitroGLYCERIN (NITROSTAT) 0.4 MG SL tablet Place 1 tablet (0.4 mg total) under the tongue every 5 (five) minutes as needed for chest pain.   ONETOUCH DELICA LANCETS FINE MISC Use to check sugar daily   ONETOUCH VERIO test strip USE 1 STRIP TO CHECK GLUCOSE ONCE DAILY   Semaglutide,0.25 or 0.'5MG'$ /DOS, (OZEMPIC, 0.25 OR 0.5 MG/DOSE,) 2 MG/1.5ML SOPN Inject 0.5 mg into the skin once a week.   spironolactone (ALDACTONE) 25 MG tablet Take 25 mg by mouth in the morning.   tamsulosin (FLOMAX) 0.4 MG CAPS capsule TAKE 1 CAPSULE BY MOUTH  DAILY   No facility-administered encounter medications on file as of 02/13/2022.  Fill History: AMIODARONE '200MG'$     TAB 02/03/2022 45   ELIQUIS  5 MG TABS 09/25/2021 90   ATORVASTATIN CALCIUM  40 MG TABS 10/06/2021 90   GABAPENTIN  100 MG CAPS 11/06/2021 90   LISINOPRIL  20 MG TABS 11/08/2021 90   METOPROLOL SUCCINATE ER  100 MG TB24 10/06/2021 90   OZEMPIC  2 MG/3ML SOPN 12/19/2021 84   SPIRONOLACTONE  25 MG TABS 11/08/2021 100   TAMSULOSIN HYDROCHLORIDE  0.4 MG CAPS 11/08/2021 100   GLIPIZIDE  5 MG TABS 10/06/2021  90   METFORMIN HYDROCHLORIDE  1000 MG TABS 11/16/2021 100   Recent Relevant Labs: Lab Results  Component Value Date/Time   HGBA1C 7.7 (A) 12/19/2021 09:47 AM   HGBA1C 7.5 (A) 08/20/2021 10:01 AM   HGBA1C 7.9 (H) 09/29/2018 02:23 AM   HGBA1C 6.9 (H) 08/08/2016 08:56 AM   MICROALBUR 13.8 (H) 08/08/2016 08:56 AM   MICROALBUR 11.4 (H) 07/21/2015 08:37 AM    Kidney Function Lab Results  Component Value Date/Time   CREATININE 1.22 10/09/2021 10:16 AM   CREATININE 1.13 04/06/2021 03:50 PM   CREATININE 1.08 01/08/2021 08:00 AM   CREATININE 1.21 (H) 02/18/2017 08:30 AM   GFR 55.98 (L) 10/09/2021 10:16 AM   GFRNONAA >60 01/08/2021 08:00 AM    GFRNONAA 58 (L) 02/18/2017 08:30 AM   GFRAA 53 (L) 09/29/2018 10:14 AM   GFRAA 67 02/18/2017 08:30 AM    Current antihyperglycemic regimen:  Glipizide 5 mg 1/2 tablet twice daily  Metformin 1000 mg twice daily Ozempic '2mg'$ /1.81m  Inject 0.25 mg once a week This is how patient is currently taking his medications  What recent interventions/DTPs have been made to improve glycemic control:  Patient has kept Ozempic at 0.25 mg weekly and Decreased Glipizide to 5 mg 1/2 twice daily due to blood sugars running lower. See phone note 01/30/2022  Have there been any recent hospitalizations or ED visits since last visit with CPP? No recent hospital visits.   Patient denies hypoglycemic symptoms since cutting Glipizide down to 1/2 tablet twice daily, including None  Patient denies hyperglycemic symptoms, including none  How often are you checking your blood sugar? Patient is checking blood sugars daily AM fasting and on occasion he will check prior to eating lunch  What are your blood sugars ranging?  AM Fasting: last daily readings starting with this morning 105, 111, 95, 84, 93, 76, 84 On 02/12/22 before his lunch (5 hrs pp) was 95  During the week, how often does your blood glucose drop below 70? Patient denies any readings below 70  Are you checking your feet daily/regularly? Patient is checking his feet daily  Adherence Review: Is the patient currently on a STATIN medication? Yes Is the patient currently on ACE/ARB medication? Yes Does the patient have >5 day gap between last estimated fill dates? Yes  Care Gaps: AWV - scheduled 12/30/2022 Last BP - 140/90 on 12/19/2021 Last A1C - 7.7 on 12/19/2021 Covid booster - overdue Eye exam - everdue  Star Rating Drugs: Atorvastatin 40 mg - last [facility] 10[facility]06/2021 [facility] [facility] [place] - last filled 10/06/2021 90 [place] [facility] [place] 20 mg - last filled 11/08/2021  90 [facility] mg - last filled 11/16/2021 100 [place].5 mg - last filled 12/19/2021 84 [facility] [place] [place] [phone]

## 2022-02-15 ENCOUNTER — Ambulatory Visit (INDEPENDENT_AMBULATORY_CARE_PROVIDER_SITE_OTHER): Payer: Medicare Other

## 2022-02-15 DIAGNOSIS — I10 Essential (primary) hypertension: Secondary | ICD-10-CM

## 2022-02-15 DIAGNOSIS — E1165 Type 2 diabetes mellitus with hyperglycemia: Secondary | ICD-10-CM

## 2022-02-15 DIAGNOSIS — I4891 Unspecified atrial fibrillation: Secondary | ICD-10-CM

## 2022-02-15 DIAGNOSIS — E785 Hyperlipidemia, unspecified: Secondary | ICD-10-CM

## 2022-02-15 NOTE — Patient Instructions (Signed)
Visit Information  Thank you for taking time to visit with me today. Please don't hesitate to contact me if I can be of assistance to you before our next scheduled telephone appointment.  Following are the goals we discussed today:  Take all medications as prescribed Attend all scheduled provider appointments Call pharmacy for medication refills 3-7 days in advance of running out of medications Perform all self care activities independently  Perform IADL's (shopping, preparing meals, housekeeping, managing finances) independently Call provider office for new concerns or questions  keep appointment with eye doctor check blood sugar at prescribed times: once daily and when you have symptoms of low or high blood sugar check feet daily for cuts, sores or redness take the blood sugar log to all doctor visits drink 6 to 8 glasses of water each day fill half of plate with vegetables limit fast food meals to no more than 1 per week manage portion size wash and dry feet carefully every day check pulse (heart) rate once a day make a plan to exercise regularly make a plan to eat healthy take medicine as prescribed check blood pressure 3 times per week choose a place to take my blood pressure (home, clinic or office, retail store) take blood pressure log to all doctor appointments call doctor for signs and symptoms of high blood pressure keep all doctor appointments eat more whole grains, fruits and vegetables, lean meats and healthy fats limit salt intake to '2300mg'$ /day call for medicine refill 2 or 3 days before it runs out take all medications exactly as prescribed call doctor with any symptoms you believe are related to your medicine  Our next appointment is by telephone on 05/03/22 at 9 AM  Please call the care guide team at [phone] if you need to cancel or reschedule your appointment.   If you are experiencing a Mental Health or [facility] or need someone to talk to,  please call the Suicide and Crisis Lifeline: 988 call the [place] National Suicide Prevention Lifeline: (873)824-9389 or TTY: 581-448-0611 TTY 515-759-9118) to talk to a trained counselor call 1-800-273-TALK (toll free, 24 hour hotline) go to [facility] [address], [place] [phone]) call 911   Patient verbalizes understanding of instructions and care plan provided today and agrees to view in [place]. Active MyChart status and patient understanding of how to access instructions and care plan via MyChart confirmed with patient.     Peter Garter RN, Jackquline Denmark, CDE Care Management Coordinator [facility] [phone]

## 2022-02-15 NOTE — Chronic Care Management (AMB) (Signed)
Chronic Care Management   CCM RN Visit Note  02/15/2022 Name: Charles [name] MRN: [id number] DOB: 02/21/1941  Subjective: Charles [name] is a 81 y.o. year old male who is a primary care patient of [name], NP. The care management team was consulted for assistance with disease management and care coordination needs.    Engaged with patient by telephone for follow up visit in response to provider referral for case management and/or care coordination services.   Consent to Services:  The patient was given information about Chronic Care Management services, agreed to services, and gave verbal consent prior to initiation of services.  Please see initial visit note for detailed documentation.   Patient agreed to services and verbal consent obtained.   Assessment: Review of patient past medical history, allergies, medications, health status, including review of consultants reports, laboratory and other test data, was performed as part of comprehensive evaluation and provision of chronic care management services.   SDOH (Social Determinants of Health) assessments and interventions performed:    CCM Care Plan  Allergies  Allergen Reactions   Bee Venom Anaphylaxis    Outpatient Encounter Medications as of 02/15/2022  Medication Sig   amiodarone (PACERONE) 200 MG tablet Take 200 mg by mouth 2 (two) times daily.   aspirin 81 MG chewable tablet Chew 81 mg by mouth in the morning.   atorvastatin (LIPITOR) 40 MG tablet Take 1 tablet (40 mg total) by mouth daily at 6 PM.   Cholecalciferol (VITAMIN D-3 PO) Take 5,000 Units by mouth in the morning.   Cyanocobalamin (VITAMIN B-12) 5000 MCG SUBL Place 5,000 mcg under the tongue every 3 (three) days.   ELIQUIS 5 MG TABS tablet Take 5 mg by mouth 2 (two) times daily.   EPINEPHrine (EPI-PEN) 0.3 mg/0.3 mL DEVI Inject 0.3 mg into the muscle daily as needed (allergic reaction).   gabapentin (NEURONTIN) 100 MG capsule TAKE 1 CAPSULE BY MOUTH AT  BEDTIME    glipiZIDE (GLUCOTROL) 5 MG tablet TAKE 1 TABLET BY MOUTH  TWICE DAILY BEFORE A MEAL   Insulin Pen Needle (PEN NEEDLES) 32G X 4 MM MISC Use as instructed to administer weekly injection.   Insulin Syringes, Disposable, U-100 0.5 ML MISC Use 1x a day   lisinopril (ZESTRIL) 20 MG tablet Take 20 mg by mouth in the morning. (Patient not taking: Reported on 12/28/2021)   metFORMIN (GLUCOPHAGE) 1000 MG tablet Take 1 tablet (1,000 mg total) by mouth 2 (two) times daily with a meal.   metoprolol succinate (TOPROL-XL) 100 MG 24 hr tablet Take 50-100 mg by mouth See admin instructions. Take 1 tablet (100 mg) gy mouth in the morning & take 0.5 tablet (50 mg) by mouth in the evening.   Multiple Vitamins-Minerals (PRESERVISION AREDS 2 PO) Take 1 tablet by mouth in the morning and at bedtime.   nitroGLYCERIN (NITROSTAT) 0.4 MG SL tablet Place 1 tablet (0.4 mg total) under the tongue every 5 (five) minutes as needed for chest pain.   ONETOUCH DELICA LANCETS FINE MISC Use to check sugar daily   ONETOUCH VERIO test strip USE 1 STRIP TO CHECK GLUCOSE ONCE DAILY   Semaglutide,0.25 or 0.5MG/DOS, (OZEMPIC, 0.25 OR 0.5 MG/DOSE,) 2 MG/1.5ML SOPN Inject 0.5 mg into the skin once a week.   spironolactone (ALDACTONE) 25 MG tablet Take 25 mg by mouth in the morning.   tamsulosin (FLOMAX) 0.4 MG CAPS capsule TAKE 1 CAPSULE BY MOUTH  DAILY   No facility-administered encounter medications on file as  of 02/15/2022.    Patient Active Problem List   Diagnosis Date Noted   Acquired trigger finger of right index finger 05/23/2021   Cellulitis 03/22/2021   Neck mass 01/08/2021   Atrial fibrillation (HCC) 10/25/2020   Parotid mass 10/25/2020   Epistaxis 09/29/2018   Hyperkalemia 09/29/2018   AKI (acute kidney injury) (HCC) 09/29/2018   Lumbar radiculopathy 08/06/2017   Greater trochanteric bursitis of left hip 07/04/2017   Rotator cuff arthropathy of left shoulder 01/24/2017   AC (acromioclavicular) arthritis 01/24/2017    Vitamin B12 deficiency 01/18/2016   NSTEMI (non-ST elevated myocardial infarction) (HCC) 10/19/2015   Acute non Q wave MI (myocardial infarction), initial episode of care (HCC) 10/19/2015   Peripheral neuropathy 10/13/2015   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HCC) 07/14/2015   Actinic keratosis of right cheek 10/04/2014   Contact dermatitis and eczema 02/10/2014   Secondary renovascular hypertension, 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 Afib,HTN and HLD)   Priority: High     Long-Range Goal: Establish Plan of Care for Chronic Disease Management Needs (DM. Afib,HTN and HLD)   Start Date: 08/24/2021  Expected End Date: 12/28/2022  Priority: High  Note:   Current Barriers:  Knowledge Deficits related to plan of care for management of Atrial Fibrillation, HTN, HLD, and DMII  Chronic Disease Management support and education needs related to Atrial Fibrillation, HTN, HLD, and DMII  States he has been taking the lower dose of Ozempic and he is not having any side effects.  States he plans to stay on the lower dose due to cost and it seems to be working. States he is eating less and has not been snacking.  States he has lost 12 lbs.   States his fasting readings range from 78-120 and 90-120 later in the day. States he did have the feeling that he was going low when he was at the [employer] which he treated with hard candy and he felt  better. States he is now taking 1/2 glipizide 5 mg twice day. States he is still going to gym 5 days a week walking 1-2 miles and working on exercise/weight machines. Deneis any chest pains, shortness of breath or swelling.  States his B/P is good when he checks at the [employer].  States he saw cardiology in May and he was still in Atrial fib.  States he had no symptoms of being out of rhythm but he did notice having some shortness of breath when walking fast on the track at the [employer].  States he is to go back to Dr. Harwani at the end of June.  States he had his eye appointment in May. States his B/P has been lower when he checks it at the [employer] and last reading was 105/68  RNCM Clinical Goal(s):  Patient will verbalize understanding of plan for management of Atrial Fibrillation, HTN, HLD, and DMII as evidenced by voiced adherence to plan of care verbalize basic   understanding of  Atrial Fibrillation, HTN, HLD, and DMII disease process and self health management plan as evidenced by voiced understanding and teach back take all medications exactly as prescribed and will call provider for medication related questions as evidenced by dispense report and pt verbalization attend all scheduled medical appointments: Annual wellness visit 12/30/22, CCM PharmD 05/28/22, endocrinology 04/24/22, cardiology late June as evidenced by medical records demonstrate Improved adherence to prescribed treatment plan for Atrial Fibrillation, HTN, HLD, and DMII as evidenced by readings within limits, voiced adherence to plan of care continue to work with RN Care Manager to address care management and care coordination needs related to  Atrial Fibrillation, HTN, HLD, and DMII as evidenced by adherence to CM Team Scheduled appointments through collaboration with RN Care manager, provider, and care team.   Interventions: 1:1 collaboration with primary care provider regarding development and update of comprehensive plan of care as evidenced  by provider attestation and co-signature Inter-disciplinary care team collaboration (see longitudinal plan of care) Evaluation of current treatment plan related to  self management and patient's adherence to plan as established by provider   Health Maintenance Interventions:  (Status:  Goal on track:  Yes.) Long Term Goal Patient interviewed about adult health maintenance status including  Diabetes Eye Exam    Advised patient to discuss  Diabetes Eye Exam    with primary care provider    AFIB Interventions: (Status:  Goal on track:  Yes.) Long Term Goal   Counseled on increased risk of stroke due to Afib and benefits of anticoagulation for stroke prevention Reviewed importance of adherence to anticoagulant exactly as prescribed Counseled on bleeding risk associated with Eliquis and importance of self-monitoring for signs/symptoms of bleeding Counseled on seeking medical attention after a head injury or if there is blood in the urine/stool Reinforced to look into getting KardiaMobile or Apple watch to monitor his atrial fib.  Reinforced to call provider if he has any sx .Reviewed to keep appointment with cardiology. Reviewed to discuss with provider about referral to different cardiologist if he decides he wants to change   Diabetes Interventions:  (Status:  Goal on track:  Yes.) Long Term Goal Assessed patient's understanding of A1c goal: <7% Provided education to patient about basic DM disease process Reviewed medications with patient and discussed importance of medication adherence Counseled on importance of regular laboratory monitoring as prescribed Discussed plans with patient for ongoing care management follow up and provided patient with direct contact information for care management team Provided patient with written educational materials related to hypo and hyperglycemia and importance of correct treatment Advised patient, providing education and rationale, to check cbg daily at  different times and record, calling provider for findings outside established parameters Review of patient status, including review of consultants reports, relevant laboratory and other test results, and medications completed Reinforced use and possible side effects of Ozempic.  Reinforced to notify Dr. Cruzita Lederer if he starts to have low blood sugars.  Lab Results  Component Value Date   HGBA1C 7.7 (A) 12/19/2021   Hyperlipidemia Interventions:  (Status:  Goal on track:  Yes.) Long Term Goal Medication review performed; medication list updated in electronic medical record.  Provider established cholesterol goals reviewed Counseled on importance of regular laboratory monitoring as prescribed Reviewed role and benefits of statin for ASCVD risk reduction Reviewed importance of limiting foods high in cholesterol Reviewed exercise goals and target of 150 minutes per week Reinforced to discuss lipid values with provider and endocrinologist and need  to get lipids tested while fasting  Hypertension Interventions:  (Status:  Goal on track:  Yes.) Long Term Goal Last practice recorded BP readings:  BP Readings from Last 3 Encounters:  12/19/21 140/90  10/12/21 120/80  10/09/21 132/80  Most recent eGFR/CrCl: No results found for: EGFR  No components found for: CRCL  Evaluation of current treatment plan related to hypertension self management and patient's adherence to plan as established by provider Provided education to patient re: stroke prevention, s/s of heart attack and stroke Counseled on the importance of exercise goals with target of 150 minutes per week Discussed plans with patient for ongoing care management follow up and provided patient with direct contact information for care management team Advised patient, providing education and rationale, to monitor blood pressure daily and record, calling PCP for findings outside established parameters Provided education on prescribed diet low  sodium low CHO Reinforced to call provider if his B/P are too high or low  Patient Goals/Self-Care Activities: Take all medications as prescribed Attend all scheduled provider appointments Call pharmacy for medication refills 3-7 days in advance of running out of medications Perform all self care activities independently  Perform IADL's (shopping, preparing meals, housekeeping, managing finances) independently Call provider office for new concerns or questions  keep appointment with eye doctor check blood sugar at prescribed times: once daily and when you have symptoms of low or high blood sugar check feet daily for cuts, sores or redness take the blood sugar log to all doctor visits drink 6 to 8 glasses of water each day fill half of plate with vegetables limit fast food meals to no more than 1 per week manage portion size wash and dry feet carefully every day check pulse (heart) rate once a day make a plan to exercise regularly make a plan to eat healthy take medicine as prescribed check blood pressure 3 times per week choose a place to take my blood pressure (home, clinic or office, retail store) take blood pressure log to all doctor appointments call doctor for signs and symptoms of high blood pressure keep all doctor appointments eat more whole grains, fruits and vegetables, lean meats and healthy fats limit salt intake to 2300mg/day call for medicine refill 2 or 3 days before it runs out take all medications exactly as prescribed call doctor with any symptoms you believe are related to your medicine  Follow Up Plan:  Telephone follow up appointment with care management team member scheduled for:  05/03/22 The patient has been provided with contact information for the care management team and has been advised to call with any health related questions or concerns.       Plan:Telephone follow up appointment with care management team member scheduled for:  05/03/22 The patient  has been provided with contact information for the care management team and has been advised to call with any health related questions or concerns.  Melissa Sandlin RN, BSN,CCM, CDE Care Management Coordinator [facility] [phone]          

## 2022-02-19 ENCOUNTER — Encounter: Payer: Self-pay | Admitting: Internal Medicine

## 2022-03-08 ENCOUNTER — Ambulatory Visit: Payer: Self-pay

## 2022-03-08 DIAGNOSIS — I2510 Atherosclerotic heart disease of native coronary artery without angina pectoris: Secondary | ICD-10-CM | POA: Diagnosis not present

## 2022-03-08 DIAGNOSIS — E1159 Type 2 diabetes mellitus with other circulatory complications: Secondary | ICD-10-CM

## 2022-03-08 DIAGNOSIS — I10 Essential (primary) hypertension: Secondary | ICD-10-CM

## 2022-03-08 DIAGNOSIS — Z794 Long term (current) use of insulin: Secondary | ICD-10-CM

## 2022-03-08 DIAGNOSIS — I4891 Unspecified atrial fibrillation: Secondary | ICD-10-CM | POA: Diagnosis not present

## 2022-03-08 DIAGNOSIS — Z7984 Long term (current) use of oral hypoglycemic drugs: Secondary | ICD-10-CM | POA: Diagnosis not present

## 2022-03-08 DIAGNOSIS — E785 Hyperlipidemia, unspecified: Secondary | ICD-10-CM | POA: Diagnosis not present

## 2022-03-08 DIAGNOSIS — I4892 Unspecified atrial flutter: Secondary | ICD-10-CM | POA: Diagnosis not present

## 2022-03-08 DIAGNOSIS — E1165 Type 2 diabetes mellitus with hyperglycemia: Secondary | ICD-10-CM

## 2022-03-08 DIAGNOSIS — E119 Type 2 diabetes mellitus without complications: Secondary | ICD-10-CM | POA: Diagnosis not present

## 2022-03-08 NOTE — Chronic Care Management (AMB) (Signed)
Chronic Care Management   CCM RN Visit Note  03/08/2022 Name: Charles [name] MRN: [id number] DOB: 1941-02-25  Subjective: Charles [name] is a 81 y.o. year old male who is a primary care patient of [name], NP. The care management team was consulted for assistance with disease management and care coordination needs.    Engaged with patient by telephone for follow up visit in response to provider referral for case management and/or care coordination services.   Consent to Services:  The patient was given information about Chronic Care Management services, agreed to services, and gave verbal consent prior to initiation of services.  Please see initial visit note for detailed documentation.   Patient agreed to services and verbal consent obtained.   Assessment: Review of patient past medical history, allergies, medications, health status, including review of consultants reports, laboratory and other test data, was performed as part of comprehensive evaluation and provision of chronic care management services.   SDOH (Social Determinants of Health) assessments and interventions performed:    CCM Care Plan  Allergies  Allergen Reactions   Bee Venom Anaphylaxis    Outpatient Encounter Medications as of 03/08/2022  Medication Sig   amiodarone (PACERONE) 200 MG tablet Take 200 mg by mouth 2 (two) times daily.   aspirin 81 MG chewable tablet Chew 81 mg by mouth in the morning.   atorvastatin (LIPITOR) 40 MG tablet Take 1 tablet (40 mg total) by mouth daily at 6 PM.   Cholecalciferol (VITAMIN D-3 PO) Take 5,000 Units by mouth in the morning.   Cyanocobalamin (VITAMIN B-12) 5000 MCG SUBL Place 5,000 mcg under the tongue every 3 (three) days.   ELIQUIS 5 MG TABS tablet Take 5 mg by mouth 2 (two) times daily.   EPINEPHrine (EPI-PEN) 0.3 mg/0.3 mL DEVI Inject 0.3 mg into the muscle daily as needed (allergic reaction).   gabapentin (NEURONTIN) 100 MG capsule TAKE 1 CAPSULE BY MOUTH AT   BEDTIME   glipiZIDE (GLUCOTROL) 5 MG tablet TAKE 1 TABLET BY MOUTH  TWICE DAILY BEFORE A MEAL   Insulin Pen Needle (PEN NEEDLES) 32G X 4 MM MISC Use as instructed to administer weekly injection.   Insulin Syringes, Disposable, U-100 0.5 ML MISC Use 1x a day   lisinopril (ZESTRIL) 20 MG tablet Take 20 mg by mouth in the morning. (Patient not taking: Reported on 12/28/2021)   metFORMIN (GLUCOPHAGE) 1000 MG tablet Take 1 tablet (1,000 mg total) by mouth 2 (two) times daily with a meal.   metoprolol succinate (TOPROL-XL) 100 MG 24 hr tablet Take 50-100 mg by mouth See admin instructions. Take 1 tablet (100 mg) gy mouth in the morning & take 0.5 tablet (50 mg) by mouth in the evening.   Multiple Vitamins-Minerals (PRESERVISION AREDS 2 PO) Take 1 tablet by mouth in the morning and at bedtime.   nitroGLYCERIN (NITROSTAT) 0.4 MG SL tablet Place 1 tablet (0.4 mg total) under the tongue every 5 (five) minutes as needed for chest pain.   ONETOUCH DELICA LANCETS FINE MISC Use to check sugar daily   ONETOUCH VERIO test strip USE 1 STRIP TO CHECK GLUCOSE ONCE DAILY   Semaglutide,0.25 or 0.5MG/DOS, (OZEMPIC, 0.25 OR 0.5 MG/DOSE,) 2 MG/1.5ML SOPN Inject 0.5 mg into the skin once a week.   spironolactone (ALDACTONE) 25 MG tablet Take 25 mg by mouth in the morning.   tamsulosin (FLOMAX) 0.4 MG CAPS capsule TAKE 1 CAPSULE BY MOUTH  DAILY   No facility-administered encounter medications on file as  of 03/08/2022.    Patient Active Problem List   Diagnosis Date Noted   Acquired trigger finger of right index finger 05/23/2021   Cellulitis 03/22/2021   Neck mass 01/08/2021   Atrial fibrillation ([place]) 10/25/2020   Parotid mass 10/25/2020   Epistaxis 09/29/2018   Hyperkalemia 09/29/2018   AKI (acute kidney injury) ([place]) 09/29/2018   Lumbar radiculopathy 08/06/2017   Greater trochanteric bursitis of left hip 07/04/2017   Rotator cuff arthropathy of left shoulder 01/24/2017   AC (acromioclavicular) arthritis  01/24/2017   Vitamin B12 deficiency 01/18/2016   NSTEMI (non-ST elevated myocardial infarction) ([place]) 10/19/2015   Acute non Q wave MI (myocardial infarction), initial episode of care ([place]) 10/19/2015   Peripheral neuropathy 10/13/2015   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   Actinic keratosis of right cheek 10/04/2014   Contact dermatitis and eczema 02/10/2014   Secondary renovascular hypertension, 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 Afib,HTN and HLD) Resolved 03/08/2022  Priority: High     Long-Range Goal: Establish Plan of Care for Chronic Disease Management Needs (DM. Afib,HTN and HLD) Completed 03/08/2022  Start Date: 08/24/2021  Expected End Date: 12/28/2022  Priority: High  Note:   RNCM case closed goals met Current Barriers:  Knowledge Deficits related to plan of care for management of Atrial Fibrillation, HTN, HLD, and DMII  Chronic Disease Management support and education needs related to Atrial Fibrillation, HTN, HLD, and DMII  States he has been taking the lower dose of Ozempic and he is not having any side effects.  States he plans to stay on the lower dose due to cost and it seems to be working. States he is eating less and has not been snacking.  States he has lost 12 lbs.   States his fasting readings range from 78-120 and 90-120 later in the day. States he did have the feeling that he  was going low when he was at the [facility] which he treated with hard candy and he felt better. States he is now taking 1/2 glipizide 5 mg twice day. States he is still going to gym 5 days a week walking 1-2 miles and working on IT trainer. Deneis any chest pains, shortness of breath or swelling.  States his B/P is good when he checks at the [facility].  States he saw cardiology in May and he was still in Atrial fib.  States he had no symptoms of being out of rhythm but he did notice having some shortness of breath when walking fast on the track at the [facility].  States he is to go back to Dr. Terrence Dupont at the end of June.  States he had his eye appointment in May. States his B/P has been lower when he checks it at the [facility] and last reading was 105/68  RNCM Clinical Goal(s):  Patient will verbalize understanding of plan for management of Atrial Fibrillation, HTN, HLD, and DMII as evidenced by voiced adherence  to plan of care verbalize basic understanding of  Atrial Fibrillation, HTN, HLD, and DMII disease process and self health management plan as evidenced by voiced understanding and teach back take all medications exactly as prescribed and will call provider for medication related questions as evidenced by dispense report and pt verbalization attend all scheduled medical appointments: Annual wellness visit 12/30/22, CCM PharmD 05/28/22, endocrinology 04/24/22, cardiology late June as evidenced by medical records demonstrate Improved adherence to prescribed treatment plan for Atrial Fibrillation, HTN, HLD, and DMII as evidenced by readings within limits, voiced adherence to plan of care continue to work with RN Care Manager to address care management and care coordination needs related to  Atrial Fibrillation, HTN, HLD, and DMII as evidenced by adherence to CM Team Scheduled appointments through collaboration with RN Care manager, provider, and care team.   Interventions: 1:1 collaboration with primary care  provider regarding development and update of comprehensive plan of care as evidenced by provider attestation and co-signature Inter-disciplinary care team collaboration (see longitudinal plan of care) Evaluation of current treatment plan related to  self management and patient's adherence to plan as established by provider   Health Maintenance Interventions:  (Status:  Goal Met.) Long Term Goal Patient interviewed about adult health maintenance status including  Diabetes Eye Exam    Advised patient to discuss  Diabetes Eye Exam    with primary care provider    AFIB Interventions: (Status:  Goal Met.) Long Term Goal   Counseled on increased risk of stroke due to Afib and benefits of anticoagulation for stroke prevention Reviewed importance of adherence to anticoagulant exactly as prescribed Counseled on bleeding risk associated with Eliquis and importance of self-monitoring for signs/symptoms of bleeding Counseled on seeking medical attention after a head injury or if there is blood in the urine/stool Reinforced to look into getting KardiaMobile or Apple watch to monitor his atrial fib.  Reinforced to call provider if he has any sx .Reviewed to keep appointment with cardiology. Reviewed to discuss with provider about referral to different cardiologist if he decides he wants to change   Diabetes Interventions:  (Status:  Goal Met.) Long Term Goal Assessed patient's understanding of A1c goal: <7% Provided education to patient about basic DM disease process Reviewed medications with patient and discussed importance of medication adherence Counseled on importance of regular laboratory monitoring as prescribed Discussed plans with patient for ongoing care management follow up and provided patient with direct contact information for care management team Provided patient with written educational materials related to hypo and hyperglycemia and importance of correct treatment Advised patient,  providing education and rationale, to check cbg daily at different times and record, calling provider for findings outside established parameters Review of patient status, including review of consultants reports, relevant laboratory and other test results, and medications completed Reinforced use and possible side effects of Ozempic.  Reinforced to notify Dr. Cruzita Lederer if he starts to have low blood sugars.  Lab Results  Component Value Date   HGBA1C 7.7 (A) 12/19/2021   Hyperlipidemia Interventions:  (Status:  Goal Met.) Long Term Goal Medication review performed; medication list updated in electronic medical record.  Provider established cholesterol goals reviewed Counseled on importance of regular laboratory monitoring as prescribed Reviewed role and benefits of statin for ASCVD risk reduction Reviewed importance of limiting foods high in cholesterol Reviewed exercise goals and target of 150 minutes per week Reinforced to discuss lipid values with provider and endocrinologist and need to get lipids tested while fasting  Hypertension Interventions:  (Status:  Goal Met.) Long Term Goal Last practice recorded BP readings:  BP Readings from Last 3 Encounters:  12/19/21 140/90  10/12/21 120/80  10/09/21 132/80  Most recent eGFR/CrCl: No results found for: EGFR  No components found for: CRCL  Evaluation of current treatment plan related to hypertension self management and patient's adherence to plan as established by provider Provided education to patient re: stroke prevention, s/s of heart attack and stroke Counseled on the importance of exercise goals with target of 150 minutes per week Discussed plans with patient for ongoing care management follow up and provided patient with direct contact information for care management team Advised patient, providing education and rationale, to monitor blood pressure daily and record, calling PCP for findings outside established parameters Provided  education on prescribed diet low sodium low CHO Reinforced to call provider if his B/P are too high or low  Patient Goals/Self-Care Activities: Take all medications as prescribed Attend all scheduled provider appointments Call pharmacy for medication refills 3-7 days in advance of running out of medications Perform all self care activities independently  Perform IADL's (shopping, preparing meals, housekeeping, managing finances) independently Call provider office for new concerns or questions  keep appointment with eye doctor check blood sugar at prescribed times: once daily and when you have symptoms of low or high blood sugar check feet daily for cuts, sores or redness take the blood sugar log to all doctor visits drink 6 to 8 glasses of water each day fill half of plate with vegetables limit fast food meals to no more than 1 per week manage portion size wash and dry feet carefully every day check pulse (heart) rate once a day make a plan to exercise regularly make a plan to eat healthy take medicine as prescribed check blood pressure 3 times per week choose a place to take my blood pressure (home, clinic or office, retail store) take blood pressure log to all doctor appointments call doctor for signs and symptoms of high blood pressure keep all doctor appointments eat more whole grains, fruits and vegetables, lean meats and healthy fats limit salt intake to 233m/day call for medicine refill 2 or 3 days before it runs out take all medications exactly as prescribed call doctor with any symptoms you believe are related to your medicine  Follow Up Plan:  The patient has been provided with contact information for the care management team and has been advised to call with any health related questions or concerns.  No further follow up required: RNCM case closed goals met       Plan:The patient has been provided with contact information for the care management team and has been  advised to call with any health related questions or concerns.  No further follow up required: RNCM case closed goals met MPeter GarterRN, [facility] CDE Care Management Coordinator [facility] ([phone]

## 2022-03-08 NOTE — Patient Instructions (Signed)
Visit Information RNCM case closed goals met Thank you for allowing me to share the care management and care coordination services that are available to you as part of your health plan and services through your primary care provider and medical home. Please reach out to me at [phone] if the care management/care coordination team may be of assistance to you in the future.   Peter Garter RN, Jackquline Denmark, CDE Care Management Coordinator [facility] [phone]

## 2022-03-28 DIAGNOSIS — H34811 Central retinal vein occlusion, right eye, with macular edema: Secondary | ICD-10-CM | POA: Diagnosis not present

## 2022-03-28 DIAGNOSIS — H353133 Nonexudative age-related macular degeneration, bilateral, advanced atrophic without subfoveal involvement: Secondary | ICD-10-CM | POA: Diagnosis not present

## 2022-03-28 DIAGNOSIS — H35372 Puckering of macula, left eye: Secondary | ICD-10-CM | POA: Diagnosis not present

## 2022-03-28 DIAGNOSIS — E113293 Type 2 diabetes mellitus with mild nonproliferative diabetic retinopathy without macular edema, bilateral: Secondary | ICD-10-CM | POA: Diagnosis not present

## 2022-04-24 ENCOUNTER — Ambulatory Visit: Payer: Medicare Other | Admitting: Internal Medicine

## 2022-05-03 ENCOUNTER — Telehealth: Payer: Medicare Other

## 2022-05-08 ENCOUNTER — Encounter: Payer: Self-pay | Admitting: Internal Medicine

## 2022-05-08 ENCOUNTER — Ambulatory Visit: Payer: Medicare Other | Admitting: Internal Medicine

## 2022-05-08 VITALS — BP 130/82 | HR 70 | Ht 71.0 in | Wt 254.0 lb

## 2022-05-08 DIAGNOSIS — Z79899 Other long term (current) drug therapy: Secondary | ICD-10-CM | POA: Diagnosis not present

## 2022-05-08 DIAGNOSIS — E1159 Type 2 diabetes mellitus with other circulatory complications: Secondary | ICD-10-CM

## 2022-05-08 DIAGNOSIS — E538 Deficiency of other specified B group vitamins: Secondary | ICD-10-CM | POA: Diagnosis not present

## 2022-05-08 DIAGNOSIS — E785 Hyperlipidemia, unspecified: Secondary | ICD-10-CM

## 2022-05-08 DIAGNOSIS — E1165 Type 2 diabetes mellitus with hyperglycemia: Secondary | ICD-10-CM | POA: Diagnosis not present

## 2022-05-08 DIAGNOSIS — G6289 Other specified polyneuropathies: Secondary | ICD-10-CM

## 2022-05-08 LAB — POCT GLYCOSYLATED HEMOGLOBIN (HGB A1C): Hemoglobin A1C: 5.9 % — AB (ref 4.0–5.6)

## 2022-05-08 LAB — T3, FREE: T3, Free: 2.9 pg/mL (ref 2.3–4.2)

## 2022-05-08 LAB — T4, FREE: Free T4: 0.75 ng/dL (ref 0.60–1.60)

## 2022-05-08 LAB — TSH: TSH: 9.93 u[IU]/mL — ABNORMAL HIGH (ref 0.35–5.50)

## 2022-05-08 NOTE — Progress Notes (Unsigned)
Patient ID: ZAXTON ANGERER, male   DOB: [birth year], 81 y.o.   MRN: [id number]  HPI: DEANGLO HISSONG is a 81 y.o.-year-old male, returning for f/u for DM2 dx 2008, uncontrolled, with complications (CAD - s/p NSTEMI, PN, mild CKD). Last visit 4 months ago.  Interim history: No increased urination, blurry vision, nausea, chest pain. He lost 20 lbs on Ozempic >> gained ~10 lbs daily. At this visit, he tells me that he was started on amiodarone approximately 11/2021.  He would like to come off the medication especially as he heard that it may affected the thyroid..  DM2: Reviewed HbA1c levels: Lab Results  Component Value Date   HGBA1C 7.7 (A) 12/19/2021   HGBA1C 7.5 (A) 08/20/2021   HGBA1C 6.9 (A) 05/01/2021  12/2013: 8.6%  Pt is on a regimen of: - Metformin 1000 mg 2x a day >> 2000 mg with dinner (but sugars worse) >> 1000 mg 2x a day - Glipizide 10 >> 5 mg 2x a day before meals >> 5 mg in a.m. and 5-10 mg before dinner >> 2.5 mg 2x a day before meals - Ozempic 0.25 mg weekly - started 12/2021 >> 700$ We tried to start Ozempic 0.25 mg weekly -08/2020 >> too expensive. In the past, I recommended  NPH 14 units at bedtime, but he did not start. We tried Rybelsus 3 >> 6 mg daily-added 12/2018 >> $$$ >> stopped 01/2019 He checked with his insurance and none of the SGLT2 inhibitors are covered for him.  Pt checks his sugars once a day: - am:104-181, 194 (pizza) >> 90s, 110-150, 200 >> 115-184 >> 50, 88-110, 146 - 2h after b'fast: 68x1 >> n/c >> 201, 206 >> n/c - before lunch:  n/c >> 69, 79 >> n/c >> 90 x1 >> n/c - 2h after lunch: 123-176 >> n/c - before dinner  n/c >> 160s >> n/c >> 97 >> 90s >> n/c >> 80 - 2h after dinner: n/c >> 240 >> n/c - bedtime: 110-150 >> 160s >> n/c Lowest sugar was  80s >> 100 >> 50; he has hypoglycemia awareness in the 80s. Highest sugar was 200 (forgot meds at night) >> 180 >> 146.  -+ Mild CKD, last BUN/creatinine:  Lab Results  Component Value Date   BUN 18  10/09/2021   CREATININE 1.22 10/09/2021  Of note, he has a history of nephrectomy. On lisinopril 10.  -+ HL; last set of lipids: Lab Results  Component Value Date   CHOL 148 10/09/2021   HDL 45.20 10/09/2021   LDLCALC 63 10/09/2021   LDLDIRECT 139.8 07/08/2014   TRIG 196.0 (H) 10/09/2021   CHOLHDL 3 10/09/2021  On Lipitor 40.  - last eye exam was in 01/2022: No DR; now seeing Dr. [place] and Dr. Iona Hansen- retinal vein occlusion, + cataracts, + macular "hole".  He has a history of eye surgeries.  He gets intraocular injections  - his numbness and tingling in feet improved after starting B12. Foot exam 09/2021.  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  B12 deficiency: -Diagnosed during investigation for numbness and tingling (peripheral neuropathy) -He was initially on B12 injections, then on 5000 mcg daily -He was on 5000 mcg every 3 days, changed by PCP 09/2019.  Afterwards, we switched to 1000 mcg every day, but when he ran out he started back on 5000 mcg and  he takes this every 3 days again.  Reviewed his B12 levels: Lab Results  Component Value Date   VITAMINB12 832 10/09/2021   VITAMINB12 900 10/06/2020   VITAMINB12 748 12/22/2019   VITAMINB12 1,033 (H) 10/08/2019   VITAMINB12 1,172 (H) 08/19/2018   VITAMINB12 >1500 (H) 05/04/2018   VITAMINB12 1,249 (H) 02/18/2017   VITAMINB12 234 10/22/2016   VITAMINB12 217 04/19/2016   VITAMINB12 94 (L) 10/13/2015   He was admitted with NSTEMI 10/18/2015.  He had stents placed then.  He was going to the gym 5 out of 7 days, but stopped during the coronavirus pandemic.  He restarted to go to the gym afterwards.  On tamsulosin.  ROS: + See HPI  I reviewed pt's medications, allergies, PMH, social hx, family hx, and changes were documented in the history of present illness. Otherwise, unchanged from  my initial visit note.  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[place]) 2000   left nephrectomy   Coronary artery disease    Dysrhythmia    PAF   Eczema    GERD (gastroesophageal reflux disease)    Hernia, ventral    History of blood transfusion    Hyperlipidemia    Hypertension    Myocardial infarction ([place])    Neuropathy    Obesity    Personal history of adenomatous colonic polyps 07/20/2012   3 + adenomas 2009 07/20/2012 2 diminutive polyps     Pneumonia 1942; 1950s X 1   Type II diabetes mellitus ([place])    type II   Walking pneumonia 2000's X 1   Past Surgical History:  Procedure Laterality Date   CARDIAC CATHETERIZATION N/A 10/19/2015   Procedure: Left Heart Cath and Coronary Angiography;  Surgeon: Charolette Forward, MD;  Location: [facility];  Service: Cardiovascular;  Laterality: N/A;   CARDIAC CATHETERIZATION N/A 10/19/2015   Procedure: Coronary Stent Intervention;  Surgeon: Charolette Forward, MD;  Location: [facility];  Service: Cardiovascular;  Laterality: N/A;   CATARACT EXTRACTION, BILATERAL Bilateral 01/2018   Dr. Kathrin Penner did surgery and he developed MD and is seeing specialist now for MD treatment   COLONOSCOPY     "nothing showed up this time"   COLONOSCOPY W/ BIOPSIES AND POLYPECTOMY  X 1   CORONARY ANGIOPLASTY     EXCISION MASS NECK Left 01/08/2021   Procedure: EXCISION MASS NECK;  Surgeon: Melida Quitter, MD;  Location: [facility];  Service: ENT;  Laterality: Left;   FEMUR IM NAIL Right 07/26/2013   Procedure: INTRAMEDULLARY (IM) NAIL FEMORAL subtrochanteric;  Surgeon: Mauri Pole, MD;  Location: [facility];  Service: Orthopedics;  Laterality: Right;   FRACTURE SURGERY     INGUINAL HERNIA REPAIR Left >3 times   INGUINAL HERNIA REPAIR Right 2012   NEPHRECTOMY Left 2000   TONSILLECTOMY  1940s   Social  History   Socioeconomic History   Marital status: Widowed    Spouse name: Not on file   Number of children: 3   Years of education: Not on file   Highest education level: Master's degree (e.g., MA, MS, MEng, MEd, MSW, MBA)  Occupational History   Occupation: retired  Tobacco Use   Smoking status: Former    Years: 20.00    Types: Cigarettes, Pipe, Landscape architect  Quit date: 09/09/1968    Years since quitting: 53.6   Smokeless tobacco: Never   Tobacco comments:    smoking cigars- quit in 2006  , quit pipe 2000  Vaping Use   Vaping Use: Never used  Substance and Sexual Activity   Alcohol use: Not Currently    Alcohol/week: 2.0 standard drinks of alcohol    Types: 2 Glasses of wine per week    Comment: 2 glasses on Tuesday   Drug use: No   Sexual activity: Never  Other Topics Concern   Not on file  Social History Narrative   Regular exercise: goes to the [facility] and walks   Caffeine use: daily; coffee   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     widowed    Three children, One in [place], one in [place] one in [place] - 2 : youngest daughter lives with him   Social Determinants of Health   Financial Resource Strain: Low Risk  (12/26/2021)   Overall Financial Resource Strain (CARDIA)    Difficulty of Paying Living Expenses: Not hard at all  Food Insecurity: No Food Insecurity (12/26/2021)   Hunger Vital Sign    Worried About Running Out of Food in the Last Year: Never true    Ran Out of Food in the Last Year: Never true  Transportation Needs: No Transportation Needs (12/26/2021)   PRAPARE - Hydrologist (Medical): No    Lack of Transportation (Non-Medical): No  Physical Activity: Sufficiently Active (12/26/2021)   Exercise Vital Sign    Days of Exercise per Week: 5 days    Minutes of Exercise per Session: 140 min  Stress: No Stress Concern Present (12/26/2021)   [place]    Feeling of Stress : Not at all  Social Connections: Moderately Integrated (12/26/2021)   Social Connection and Isolation Panel [NHANES]    Frequency of Communication with Friends and Family: More than three times a week    Frequency of Social Gatherings with Friends and Family: More than three times a week    Attends Religious Services: More than 4 times per year    Active Member of [employer] or Organizations: Yes    Attends Archivist Meetings: More than 4 times per year    Marital Status: Widowed  Recent Concern: Social Connections - Moderately Isolated (10/11/2021)   Social Connection and Isolation Panel [NHANES]    Frequency of Communication with Friends and Family: More than three times a week    Frequency of Social Gatherings with Friends and Family: More than three times a week    Attends Religious Services: More than 4 times per year    Active Member of [employer] or Organizations: No    Attends Archivist Meetings: Not on file    Marital Status: Widowed  Intimate Partner Violence: Not At Risk (12/26/2021)   Humiliation, Afraid, Rape, and Kick questionnaire    Fear of Current or Ex-Partner: No    Emotionally Abused: No    Physically Abused: No    Sexually Abused: No   Current Outpatient Medications on File Prior to Visit  Medication Sig Dispense Refill   amiodarone (PACERONE) 200 MG tablet Take 200 mg by mouth 2 (two) times daily.     aspirin 81 MG chewable tablet Chew 81 mg by mouth in the morning.     atorvastatin (LIPITOR) 40 MG tablet Take 1  tablet (40 mg total) by mouth daily at 6 PM. 30 tablet 3   Cholecalciferol (VITAMIN D-3 PO) Take 5,000 Units by mouth in the morning.     Cyanocobalamin (VITAMIN B-12) 5000 MCG SUBL Place 5,000 mcg under the tongue every 3 (three) days.     ELIQUIS 5 MG TABS tablet Take 5 mg by mouth 2 (two) times daily.     EPINEPHrine (EPI-PEN) 0.3 mg/0.3 mL DEVI Inject 0.3 mg into the muscle daily as needed (allergic  reaction).     gabapentin (NEURONTIN) 100 MG capsule TAKE 1 CAPSULE BY MOUTH AT  BEDTIME 90 capsule 3   glipiZIDE (GLUCOTROL) 5 MG tablet TAKE 1 TABLET BY MOUTH  TWICE DAILY BEFORE A MEAL 180 tablet 3   Insulin Pen Needle (PEN NEEDLES) 32G X 4 MM MISC Use as instructed to administer weekly injection. 30 each 1   Insulin Syringes, Disposable, U-100 0.5 ML MISC Use 1x a day 100 each 3   lisinopril (ZESTRIL) 20 MG tablet Take 20 mg by mouth in the morning. (Patient not taking: Reported on 12/28/2021)     metFORMIN (GLUCOPHAGE) 1000 MG tablet Take 1 tablet (1,000 mg total) by mouth 2 (two) times daily with a meal. 180 tablet 3   metoprolol succinate (TOPROL-XL) 100 MG 24 hr tablet Take 50-100 mg by mouth See admin instructions. Take 1 tablet (100 mg) gy mouth in the morning & take 0.5 tablet (50 mg) by mouth in the evening.     Multiple Vitamins-Minerals (PRESERVISION AREDS 2 PO) Take 1 tablet by mouth in the morning and at bedtime.     nitroGLYCERIN (NITROSTAT) 0.4 MG SL tablet Place 1 tablet (0.4 mg total) under the tongue every 5 (five) minutes as needed for chest pain. 25 tablet 1   ONETOUCH DELICA LANCETS FINE MISC Use to check sugar daily 100 each 5   ONETOUCH VERIO test strip USE 1 STRIP TO CHECK GLUCOSE ONCE DAILY 100 each 3   Semaglutide,0.25 or 0.'5MG'$ /DOS, (OZEMPIC, 0.25 OR 0.5 MG/DOSE,) 2 MG/1.5ML SOPN Inject 0.5 mg into the skin once a week. 4.5 mL 3   spironolactone (ALDACTONE) 25 MG tablet Take 25 mg by mouth in the morning.     tamsulosin (FLOMAX) 0.4 MG CAPS capsule TAKE 1 CAPSULE BY MOUTH  DAILY 90 capsule 3   No current facility-administered medications on file prior to visit.   Allergies  Allergen Reactions   Bee Venom Anaphylaxis   Family History  Problem Relation Age of Onset   Drug abuse Other    Cancer Other    Heart disease Other    Lung disease Other    Diabetes Mother    Diabetes Sister    Colon cancer Neg Hx    Stomach cancer Neg Hx     PE: BP 118/86 (BP  Location: Left Arm, Patient Position: Sitting, Cuff Size: Normal)   Pulse 62   Ht '5\' 11"'$  (1.803 m)   Wt 251 lb (113.9 kg)   SpO2 95%   BMI 35.01 kg/m  Wt Readings from Last 3 Encounters:  05/08/22 251 lb (113.9 kg)  12/26/21 260 lb (117.9 kg)  12/19/21 260 lb (117.9 kg)   Constitutional: overweight, in NAD Eyes: EOMI, no exophthalmos ENT: moist mucous membranes, no thyromegaly, no cervical lymphadenopathy Cardiovascular: RRR, No MRG, +R>L LE swelling Respiratory: CTA B Musculoskeletal: no deformities Skin: moist, warm, no rashes Neurological: no tremor with outstretched hands  ASSESSMENT: 1. DM2, not insulin-dependent, uncontrolled, with complications - CAD -  s/p NSTEMI (Dr. Terrence Dupont) - peripheral neuropathy - CKD   He does not have a family history of medullary thyroid cancer or personal history of pancreatitis.   2. B12 def  3.  Peripheral neuropathy  4. HL  5.  Amiodarone treatment  PLAN:  1. Patient with longstanding, previously uncontrolled, but with much better control after starting Ozempic at last visit.  At that time, he was interested in starting a medication that could help with weight loss.  We continued metformin and sulfonylurea at that time, but due to improved control and low blood sugars, he backed off the glipizide dose to only 2.5 mg twice a day.  He is now taking the lowest dose of Ozempic. -At last visit, HbA1c was higher, at 7.7%. -Today's visit, sugars are excellent, and he even had a low blood sugar at 50.  He feels that Ozempic is working very well for him, even at the lowest dose.  However, he tells me that he cannot continue taking it due to the price (although this is covered by his insurance).  He has 4 more doses left.  I advised him to stop glipizide until he runs out of Ozempic.  When he does, he may need to restart glipizide at a low dose of 2.5 mg twice a day. -At today's visit, - I advised him to: Patient Instructions  Please continue: -  Metformin 1000 mg 2x a day with meals - Ozempic 0.25 mg weekly until you run out.  After you run out of Ozempic, we may need to start: - Glipizide 2.5 mg 2x a day before meals  Please check some sugars later in the day, also.  Please return in 4 months with your sugar log.  - we checked his HbA1c: 5.9% (much lower) - advised to check sugars at different times of the day - 4x a day, rotating check times - advised for yearly eye exams >> he is UTD - return to clinic in 3-4 months    2.  And 3. Peripheral neuropathy and B12 deficiency -He is peripheral neuropathy probably related to both diabetes and B12 deficiency -He is on 5000 mcg B12 every 3 days; we did discuss in the past about switching to 1000 mcg daily after he ran out of the high-dose tablet but he had 5000 mcg tablets at home so he continues on these. -B12 level was at goal in 09/2021  4. HL -Reviewed latest lipid panel from 09/2021: LDL above our goal of less than 55 due to history of cardiovascular disease, triglycerides also high: Lab Results  Component Value Date   CHOL 148 10/09/2021   HDL 45.20 10/09/2021   LDLCALC 63 10/09/2021   LDLDIRECT 139.8 07/08/2014   TRIG 196.0 (H) 10/09/2021   CHOLHDL 3 10/09/2021  -He continues Lipitor 40 mg daily without side effects  5.  Amiodarone treatment -He would like to come off amiodarone.  I advised him to discuss with cardiology. -He also would like his thyroid checked.  I will check a TSH today. -No signs or symptoms of thyrotoxicosis or hypothyroidism.  Component     Latest Ref Rng 05/08/2022  TSH     0.35 - 5.50 uIU/mL 9.93 (H)   TSH is high.  We will add free T4 and free T3.  Philemon Kingdom, MD PhD [facility]

## 2022-05-08 NOTE — Patient Instructions (Addendum)
Please continue: - Metformin 1000 mg 2x a day with meals - Ozempic 0.25 mg weekly until you run out.  After you run out of Ozempic, we may need to start: - Glipizide 2.5 mg 2x a day before meals  Please check some sugars later in the day, also.  Please return in 4 months with your sugar log.

## 2022-05-09 ENCOUNTER — Encounter: Payer: Self-pay | Admitting: Internal Medicine

## 2022-05-14 ENCOUNTER — Ambulatory Visit (INDEPENDENT_AMBULATORY_CARE_PROVIDER_SITE_OTHER): Payer: Medicare Other | Admitting: Adult Health

## 2022-05-14 ENCOUNTER — Encounter: Payer: Self-pay | Admitting: Adult Health

## 2022-05-14 VITALS — BP 130/100 | HR 67 | Temp 98.0°F | Ht 71.0 in | Wt 252.0 lb

## 2022-05-14 DIAGNOSIS — I4891 Unspecified atrial fibrillation: Secondary | ICD-10-CM | POA: Diagnosis not present

## 2022-05-14 DIAGNOSIS — R7989 Other specified abnormal findings of blood chemistry: Secondary | ICD-10-CM

## 2022-05-14 NOTE — Progress Notes (Signed)
Subjective:    Patient ID: Charles [name], male    DOB: 1941/05/16, 81 y.o.   MRN: [id number]  HPI 81 year old male who  has a past medical history of Anemia, Arthritis, AVM (arteriovenous malformation) of colon, Cancer of kidney ([place]) (2000), Coronary artery disease, Dysrhythmia, Eczema, GERD (gastroesophageal reflux disease), Hernia, ventral, History of blood transfusion, Hyperlipidemia, Hypertension, Myocardial infarction ([place]), Neuropathy, Obesity, Personal history of adenomatous colonic polyps (07/20/2012), Pneumonia (1942; 1950s X 1), Type II diabetes mellitus ([place]), and Walking pneumonia (2000's X 1).  He presents to the office today for concern of " thyroid levels". He was seen by his endocrinologist, Dr. Cruzita Lederer about a week ago and had this thyroid level checked with showed a normal Free T3&T4 but a TSH of 9.93. His cardiologist placed him on Amiodorone 200 mg BID about 5 months ago and then decreased the dose to 200 mg daily.   He reports that he stopped amiodarone last week but took a dose yesterday. He did not consult with his cardiologist prior to stopping this medication.  He has not experienced any chest pain or shortness of breath.  He is asymptomatic in regards to symptoms from hypothyroidism   Review of Systems See HPI   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[place]) 2000   left nephrectomy   Coronary artery disease    Dysrhythmia    PAF   Eczema    GERD (gastroesophageal reflux disease)    Hernia, ventral    History of blood transfusion    Hyperlipidemia    Hypertension    Myocardial infarction ([place])    Neuropathy    Obesity    Personal history of adenomatous colonic polyps 07/20/2012   3 + adenomas 2009 07/20/2012 2 diminutive polyps     Pneumonia 1942; 1950s X 1   Type II diabetes mellitus ([place])    type  II   Walking pneumonia 2000's X 1    Social History   Socioeconomic History   Marital status: Widowed    Spouse name: Not on file   Number of children: 3   Years of education: Not on file   Highest education level: Master's degree (e.g., MA, MS, MEng, MEd, MSW, MBA)  Occupational History   Occupation: retired  Tobacco Use   Smoking status: Former    Years: 20.00    Types: Cigarettes, Pipe, Cigars    Quit date: 09/09/1968    Years since quitting: 53.7   Smokeless tobacco: Never   Tobacco comments:    smoking cigars- quit in 2006  , quit pipe 2000  Vaping Use   Vaping Use: Never used  Substance and Sexual Activity   Alcohol use: Not Currently    Alcohol/week: 2.0 standard drinks of alcohol    Types: 2 Glasses of wine per week    Comment: 2 glasses on Tuesday   Drug use: No   Sexual activity: Never  Other Topics Concern   Not on file  Social History Narrative   Regular exercise: goes to the [facility] and walks   Caffeine use: daily; coffee   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     widowed    Three children, One in [place], one in [place] one in [place]  HH - 2 : youngest daughter lives with him   Social Determinants of Health   Financial Resource Strain: Low Risk  (12/26/2021)   Overall Financial Resource Strain (CARDIA)    Difficulty of Paying Living Expenses: Not hard at all  Food Insecurity: No Food Insecurity (12/26/2021)   Hunger Vital Sign    Worried About Running Out of Food in the Last Year: Never true    Ran Out of Food in the Last Year: Never true  Transportation Needs: No Transportation Needs (12/26/2021)   PRAPARE - Hydrologist (Medical): No    Lack of Transportation (Non-Medical): No  Physical Activity: Sufficiently Active (12/26/2021)   Exercise Vital Sign    Days of Exercise per Week: 5 days    Minutes of Exercise per Session: 140 min  Stress: No Stress Concern Present (12/26/2021)   [place]    Feeling of Stress : Not at all  Social Connections: Moderately Integrated (12/26/2021)   Social Connection and Isolation Panel [NHANES]    Frequency of Communication with Friends and Family: More than three times a week    Frequency of Social Gatherings with Friends and Family: More than three times a week    Attends Religious Services: More than 4 times per year    Active Member of [employer] or Organizations: Yes    Attends Archivist Meetings: More than 4 times per year    Marital Status: Widowed  Recent Concern: Social Connections - Moderately Isolated (10/11/2021)   Social Connection and Isolation Panel [NHANES]    Frequency of Communication with Friends and Family: More than three times a week    Frequency of Social Gatherings with Friends and Family: More than three times a week    Attends Religious Services: More than 4 times per year    Active Member of [employer] or Organizations: No    Attends Archivist Meetings: Not on file    Marital Status: Widowed  Intimate Partner Violence: Not At Risk (12/26/2021)   Humiliation, Afraid, Rape, and Kick questionnaire    Fear of Current or Ex-Partner: No    Emotionally Abused: No    Physically Abused: No    Sexually Abused: No    Past Surgical History:  Procedure Laterality Date   CARDIAC CATHETERIZATION N/A 10/19/2015   Procedure: Left Heart Cath and Coronary Angiography;  Surgeon: Charolette Forward, MD;  Location: [facility];  Service: Cardiovascular;  Laterality: N/A;   CARDIAC CATHETERIZATION N/A 10/19/2015   Procedure: Coronary Stent Intervention;  Surgeon: Charolette Forward, MD;  Location: [facility];  Service: Cardiovascular;  Laterality: N/A;   CATARACT EXTRACTION, BILATERAL Bilateral 01/2018   Dr. Kathrin Penner did surgery and he developed MD and is seeing specialist now for MD treatment   COLONOSCOPY     "nothing showed up this time"   COLONOSCOPY W/  BIOPSIES AND POLYPECTOMY  X 1   CORONARY ANGIOPLASTY     EXCISION MASS NECK Left 01/08/2021   Procedure: EXCISION MASS NECK;  Surgeon: Melida Quitter, MD;  Location: [facility];  Service: ENT;  Laterality: Left;   FEMUR IM NAIL Right 07/26/2013   Procedure: INTRAMEDULLARY (IM) NAIL FEMORAL subtrochanteric;  Surgeon: Mauri Pole, MD;  Location: [facility];  Service: Orthopedics;  Laterality: Right;   FRACTURE SURGERY     INGUINAL HERNIA REPAIR Left >3 times   INGUINAL HERNIA REPAIR Right 2012   NEPHRECTOMY  Left 2000   TONSILLECTOMY  1940s    Family History  Problem Relation Age of Onset   Drug abuse Other    Cancer Other    Heart disease Other    Lung disease Other    Diabetes Mother    Diabetes Sister    Colon cancer Neg Hx    Stomach cancer Neg Hx     Allergies  Allergen Reactions   Bee Venom Anaphylaxis    Current Outpatient Medications on File Prior to Visit  Medication Sig Dispense Refill   amiodarone (PACERONE) 200 MG tablet Take 200 mg by mouth 2 (two) times daily.     aspirin 81 MG chewable tablet Chew 81 mg by mouth in the morning.     atorvastatin (LIPITOR) 40 MG tablet Take 1 tablet (40 mg total) by mouth daily at 6 PM. 30 tablet 3   Cholecalciferol (VITAMIN D-3 PO) Take 5,000 Units by mouth in the morning.     Cyanocobalamin (VITAMIN B-12) 5000 MCG SUBL Place 5,000 mcg under the tongue every 3 (three) days.     ELIQUIS 5 MG TABS tablet Take 5 mg by mouth 2 (two) times daily.     EPINEPHrine (EPI-PEN) 0.3 mg/0.3 mL DEVI Inject 0.3 mg into the muscle daily as needed (allergic reaction).     gabapentin (NEURONTIN) 100 MG capsule TAKE 1 CAPSULE BY MOUTH AT  BEDTIME 90 capsule 3   glipiZIDE (GLUCOTROL) 5 MG tablet TAKE 1 TABLET BY MOUTH  TWICE DAILY BEFORE A MEAL 180 tablet 3   Insulin Pen Needle (PEN NEEDLES) 32G X 4 MM MISC Use as instructed to administer weekly injection. 30 each 1   Insulin Syringes, Disposable, U-100 0.5 ML MISC Use 1x a day 100 each 3   lisinopril  (ZESTRIL) 20 MG tablet Take 20 mg by mouth in the morning.     metFORMIN (GLUCOPHAGE) 1000 MG tablet Take 1 tablet (1,000 mg total) by mouth 2 (two) times daily with a meal. 180 tablet 3   metoprolol succinate (TOPROL-XL) 100 MG 24 hr tablet Take 50-100 mg by mouth See admin instructions. Take 1 tablet (100 mg) gy mouth in the morning & take 0.5 tablet (50 mg) by mouth in the evening.     Multiple Vitamins-Minerals (PRESERVISION AREDS 2 PO) Take 1 tablet by mouth in the morning and at bedtime.     nitroGLYCERIN (NITROSTAT) 0.4 MG SL tablet Place 1 tablet (0.4 mg total) under the tongue every 5 (five) minutes as needed for chest pain. 25 tablet 1   ONETOUCH DELICA LANCETS FINE MISC Use to check sugar daily 100 each 5   ONETOUCH VERIO test strip USE 1 STRIP TO CHECK GLUCOSE ONCE DAILY 100 each 3   Semaglutide,0.25 or 0.'5MG'$ /DOS, (OZEMPIC, 0.25 OR 0.5 MG/DOSE,) 2 MG/1.5ML SOPN Inject 0.5 mg into the skin once a week. 4.5 mL 3   spironolactone (ALDACTONE) 25 MG tablet Take 25 mg by mouth in the morning.     tamsulosin (FLOMAX) 0.4 MG CAPS capsule TAKE 1 CAPSULE BY MOUTH  DAILY 90 capsule 3   No current facility-administered medications on file prior to visit.    BP (!) 130/100   Pulse 67   Temp 98 F (36.7 C) (Oral)   Ht '5\' 11"'$  (1.803 m)   Wt 252 lb (114.3 kg)   SpO2 97%   BMI 35.15 kg/m       Objective:   Physical Exam Vitals and nursing note reviewed.  Constitutional:  Appearance: Normal appearance.  Cardiovascular:     Rate and Rhythm: Normal rate and regular rhythm.     Pulses: Normal pulses.     Heart sounds: Normal heart sounds.  Pulmonary:     Effort: Pulmonary effort is normal.     Breath sounds: Normal breath sounds.  Musculoskeletal:        General: Normal range of motion.  Skin:    General: Skin is warm and dry.  Neurological:     General: No focal deficit present.     Mental Status: He is alert and oriented to person, place, and time.  Psychiatric:        Mood  and Affect: Mood normal.        Behavior: Behavior normal.        Thought Content: Thought content normal.        Assessment & Plan:  1. Atrial fibrillation, unspecified type ([place]) -Advised to follow-up with his cardiologist prior to stopping amiodarone.  He should stay on amiodarone until he is seen.  2. Elevated TSH Recommending repeat TSH if cardiology can take him off amiodarone.  Recheck in roughly a month and a half.    Time spent with patient today was 32 minutes which consisted of chart review, discussing hypothyroidism, atrial fibrillation, treatment answering questions and documentation.  [name], NP

## 2022-05-27 ENCOUNTER — Telehealth: Payer: Self-pay | Admitting: Pharmacist

## 2022-05-27 NOTE — Chronic Care Management (AMB) (Signed)
    Chronic Care Management Pharmacy Assistant   Name: COURY GRIEGER  MRN: [id number] DOB: 1940-11-29  05/28/2022 APPOINTMENT REMINDER  [name] was reminded to have all medications, supplements and any blood glucose and blood pressure readings available for review with Jeni Salles, Pharm. D, at his telephone visit on 05/28/2022 at 8:30.  Care Gaps: AWV - scheduled 12/30/2022 Last BP - 130/100 on 05/14/2022 Last A1C - 5.9 on 05/08/2022 Urine ACR - overdue Covid booster - overdue Tdap - overdue Flu - due  Star Rating Drug: Atorvastatin 40 mg - last filled 02/26/2022 100 DS at [facility] Glipizide 5 mg - last filled 05/09/2022 100 DS at [facility] Lisinopril 20 mg - last filled 11/08/2021 90 DS at [facility] verified with Estill Bamberg Metformin 1000 mg - last filled 02/11/2022 100 DS at [facility] '2mg'$ /1.60m - last filled 12/19/2021 84 DS at [facility] verified with AEstill Bamberg Any gaps in medications fill history? Yes  JBerwindPharmacist Assistant [phone]

## 2022-05-27 NOTE — Progress Notes (Unsigned)
Chronic Care Management Pharmacy Note  05/28/2022 Name:  Charles [name] MRN:  [id number] DOB:  21-Dec-1940  Summary: A1c is at goal of < 7% but pt has to stop Ozempic in a couple of weeks due to cost Pt does not want to continue on amiodarone due to elevated TSH Pt is having a hard time affording medications   Recommendations/Changes made from today's visit: -Recommended repeat TSH and would only consider treatment if TSH is > 10 unless patient develops symptoms -Consider switching Eliquis to Xarelto due to savings program option and patient will discuss with cardiologist this week   Plan: DM assessment in 3 months Follow up in 6 months  Subjective: Charles [name] is an 81 y.o. year old male who is a primary patient of [name], NP.  The CCM team was consulted for assistance with disease management and care coordination needs.    Engaged with patient by telephone for follow up visit in response to provider referral for pharmacy case management and/or care coordination services.   Consent to Services:  The patient was given information about Chronic Care Management services, agreed to services, and gave verbal consent prior to initiation of services.  Please see initial visit note for detailed documentation.   Patient Care Team: [name], NP as PCP - General (Family Medicine) Melida Quitter, MD as Consulting Physician (Otolaryngology) Charolette Forward, MD as Consulting Physician (Cardiology) Philemon Kingdom, MD as Consulting Physician (Internal Medicine) Viona Gilmore, [facility] as Pharmacist (Pharmacist)  Recent office visits: 05/14/22 [name], NP: Patient presented for Afib follow up. BP elevated. Pt stopped amiodarone on his own due to elevated TSH.  12/26/2021 Rolene Arbour LPN - Medicare annual wellness exam.  Recent consult visits: 05/08/22 Layla Maw MD (endocrinology): Patient presented for DM follow up. Recommended stopping glipizide until he runs out of  Ozempic. Plan to restart glipizide once he runs out of Ozempic. Follow up in 4 months.  01/11/22 Charolette Forward (cardiology): Patient presented for Afib follow up. Unable to access notes.  12/26/21 Charolette Forward (cardiology): Patient presented for Afib follow up. Unable to access notes.  12/19/2021 Philemon Kingdom MD (internal med) - Patient was seen for Poorly controlled type 2 diabetes mellitus with circulatory disorder and additional issues. Started Ozempic 0.5 mg SQ weekly.  Follow up in 3-4 months.    08/08/2021 Hulan Saas DO (sports medicine) - Patient was seen for acquired trigger finger of right index finger and additional issue. No medication changes. No follow up noted.  Hospital visits: None in previous 6 months.  Objective:  Lab Results  Component Value Date   CREATININE 1.22 10/09/2021   BUN 18 10/09/2021   GFR 55.98 (L) 10/09/2021   GFRNONAA >60 01/08/2021   GFRAA 53 (L) 09/29/2018   NA 137 10/09/2021   K 4.9 10/09/2021   CALCIUM 9.4 10/09/2021   CO2 31 10/09/2021   GLUCOSE 187 (H) 10/09/2021    Lab Results  Component Value Date/Time   HGBA1C 5.9 (A) 05/08/2022 08:43 AM   HGBA1C 7.7 (A) 12/19/2021 09:47 AM   HGBA1C 7.9 (H) 09/29/2018 02:23 AM   HGBA1C 6.9 (H) 08/08/2016 08:56 AM   GFR 55.98 (L) 10/09/2021 10:16 AM   GFR 65.23 10/25/2020 10:19 AM   MICROALBUR 13.8 (H) 08/08/2016 08:56 AM   MICROALBUR 11.4 (H) 07/21/2015 08:37 AM    Last diabetic Eye exam:  Lab Results  Component Value Date/Time   HMDIABEYEEXA No Retinopathy 02/06/2022 12:00 AM    Last diabetic  Foot exam:  Lab Results  Component Value Date/Time   HMDIABFOOTEX yes 11/24/2009 12:00 AM     Lab Results  Component Value Date   CHOL 148 10/09/2021   HDL 45.20 10/09/2021   LDLCALC 63 10/09/2021   LDLDIRECT 139.8 07/08/2014   TRIG 196.0 (H) 10/09/2021   CHOLHDL 3 10/09/2021       Latest Ref Rng & Units 10/09/2021   10:16 AM 10/25/2020   10:19 AM 10/06/2020    9:40 AM  Hepatic Function   Total Protein 6.0 - 8.3 g/dL 7.3  6.5  6.6   Albumin 3.5 - 5.2 g/dL 4.3  4.1  4.1   AST 0 - 37 U/L _0 ALT 0 - 53 U/L _1 Alk Phosphatase 39 - 117 U/L 68  75  72   Total Bilirubin 0.2 - 1.2 mg/dL 0.7  0.6  0.6     Lab Results  Component Value Date/Time   TSH 9.93 (H) 05/08/2022 08:45 AM   TSH 3.52 10/09/2021 10:16 AM   FREET4 0.75 05/08/2022 12:47 PM       Latest Ref Rng & Units 10/09/2021   10:16 AM 01/08/2021    8:00 AM 10/25/2020   10:19 AM  CBC  WBC 4.0 - 10.5 K/uL 6.1  4.7  4.6   Hemoglobin 13.0 - 17.0 g/dL 14.4  13.4  12.1   Hematocrit 39.0 - 52.0 % 43.3  41.6  37.7   Platelets 150.0 - 400.0 K/uL 219.0  211  208.0     Lab Results  Component Value Date/Time   VD25OH 61.75 10/09/2021 10:16 AM   VD25OH 45 04/06/2021 03:50 PM   VD25OH 19.55 (L) 10/06/2020 09:40 AM    Clinical ASCVD: Yes  The ASCVD Risk score (Arnett DK, et al., 2019) failed to calculate for the following reasons:   The 2019 ASCVD risk score is only valid for ages 84 to 22   The patient has a prior MI or stroke diagnosis       12/26/2021    9:00 AM 10/09/2021    9:38 AM 12/20/2020    9:05 AM  Depression screen PHQ 2/9  Decreased Interest 0 0 0  Down, Depressed, Hopeless 0 0 0  PHQ - 2 Score 0 0 0     CHA2DS2/VAS Stroke Risk Points  Current as of 2 minutes ago     5 >= 2 Points: High Risk  1 - 1.99 Points: Medium Risk  0 Points: Low Risk    No Change      Details    This score determines the patient's risk of having a stroke if the  patient has atrial fibrillation.       Points Metrics  0 Has Congestive Heart Failure:  No    Current as of 2 minutes ago  1 Has Vascular Disease:  Yes     Current as of 2 minutes ago  1 Has Hypertension:  Yes    Current as of 2 minutes ago  2 Age:  81    Current as of 2 minutes ago  1 Has Diabetes:  Yes    Current as of 2 minutes ago  0 Had Stroke:  No  Had TIA:  No  Had Thromboembolism:  No    Current as of 2 minutes ago  0 Male:   No    Current as of 2 minutes ago     Social  History   Tobacco Use  Smoking Status Former   Years: 20.00   Types: Cigarettes, Pipe, Cigars   Quit date: 09/09/1968   Years since quitting: 53.7  Smokeless Tobacco Never  Tobacco Comments   smoking cigars- quit in 2006  , quit pipe 2000   BP Readings from Last 3 Encounters:  05/14/22 (!) 130/100  05/08/22 130/82  12/19/21 140/90   Pulse Readings from Last 3 Encounters:  05/14/22 67  05/08/22 70  12/19/21 82   Wt Readings from Last 3 Encounters:  05/14/22 252 lb (114.3 kg)  05/08/22 254 lb (115.2 kg)  12/26/21 260 lb (117.9 kg)   BMI Readings from Last 3 Encounters:  05/14/22 35.15 kg/m  05/08/22 35.43 kg/m  12/26/21 36.26 kg/m    Assessment/Interventions: Review of patient past medical history, allergies, medications, health status, including review of consultants reports, laboratory and other test data, was performed as part of comprehensive evaluation and provision of chronic care management services.   SDOH:  (Social Determinants of Health) assessments and interventions performed: Yes  SDOH Interventions    Flowsheet Row Chronic Care Management from 05/28/2022 in [place] at [place] from 12/26/2021 in [place] at [place] Management from 10/18/2020 in [place] at [place] Patient Outreach Telephone from 08/25/2020 in [place] Management from 05/17/2020 in [place] at [place] Management from 02/16/2020 in [place] at [place] Interventions -- Intervention Not Indicated Intervention Not Indicated Intervention Not Indicated -- --  Housing Interventions -- Intervention Not Indicated Intervention Not Indicated -- -- --  Transportation Interventions -- Intervention Not Indicated Intervention Not Indicated Intervention Not Indicated  Intervention Not Indicated Intervention Not Indicated  Financial Strain Interventions Other (Comment)  [provided savings program information] Intervention Not Indicated Intervention Not Indicated -- Intervention Not Indicated Intervention Not Indicated  Physical Activity Interventions -- Intervention Not Indicated Intervention Not Indicated -- -- --  Stress Interventions -- Intervention Not Indicated Intervention Not Indicated -- -- --  Social Connections Interventions -- Intervention Not Indicated Intervention Not Indicated -- -- --      SDOH Screenings   Food Insecurity: No Food Insecurity (12/26/2021)  Housing: Low Risk  (12/26/2021)  Transportation Needs: No Transportation Needs (12/26/2021)  Alcohol Screen: Low Risk  (12/26/2021)  Depression (PHQ2-9): Low Risk  (12/26/2021)  Financial Resource Strain: Medium Risk (05/28/2022)  Physical Activity: Sufficiently Active (12/26/2021)  Social Connections: Moderately Integrated (12/26/2021)  Recent Concern: Social Connections - Moderately Isolated (10/11/2021)  Stress: No Stress Concern Present (12/26/2021)  Tobacco Use: Medium Risk (05/14/2022)    CCM Care Plan  Allergies  Allergen Reactions   Bee Venom Anaphylaxis    Medications Reviewed Today     Reviewed by Viona Gilmore, [facility] (Pharmacist) on 05/28/22 at [place] List Status: <None>   Medication Order Taking? Sig Documenting Provider Last Dose Status Informant  amiodarone (PACERONE) 200 MG tablet [id number]  Take 200 mg by mouth 2 (two) times daily. [provider]  Active   aspirin 81 MG chewable tablet [id number]  Chew 81 mg by mouth in the morning. [provider]  Active Self  atorvastatin (LIPITOR) 40 MG tablet [id number]  Take 1 tablet (40 mg total) by mouth daily at 6 PM. Dixie Dials, MD  Active Self  Cholecalciferol (VITAMIN D-3 PO) [id number]  Take 5,000 Units by mouth in the morning. [provider]  Active  Self  Cyanocobalamin (VITAMIN B-12) 5000 MCG  SUBL [id number]  Place 5,000 mcg under the tongue every 3 (three) days. [provider]  Active Self  ELIQUIS 5 MG TABS tablet [id number]  Take 5 mg by mouth 2 (two) times daily. [provider]  Active Self  EPINEPHrine (EPI-PEN) 0.3 mg/0.3 mL DEVI [id number]  Inject 0.3 mg into the muscle daily as needed (allergic reaction). [provider]  Active Self  gabapentin (NEURONTIN) 100 MG capsule [id number]  TAKE 1 CAPSULE BY MOUTH AT  BEDTIME Hulan Saas M, DO  Active   glipiZIDE (GLUCOTROL) 5 MG tablet [id number]  TAKE 1 TABLET BY MOUTH  TWICE DAILY BEFORE A MEAL Philemon Kingdom, MD  Active   Insulin Pen Needle (PEN NEEDLES) 32G X 4 MM MISC [id number]  Use as instructed to administer weekly injection. Philemon Kingdom, MD  Active   Insulin Syringes, Disposable, U-100 0.5 ML MISC [id number]  Use 1x a day Philemon Kingdom, MD  Active Self  metFORMIN (GLUCOPHAGE) 1000 MG tablet [id number]  Take 1 tablet (1,000 mg total) by mouth 2 (two) times daily with a meal. Philemon Kingdom, MD  Active   metoprolol succinate (TOPROL-XL) 100 MG 24 hr tablet [id number]  Take 50-100 mg by mouth See admin instructions. Take 1 tablet (100 mg) gy mouth in the morning & take 0.5 tablet (50 mg) by mouth in the evening. [provider]  Active Self           Med Note Kenton Kingfisher, Germaine Pomfret Dec 27, 2020  8:38 AM)    Multiple Vitamins-Minerals (PRESERVISION AREDS 2 PO) [id number]  Take 1 tablet by mouth in the morning and at bedtime. [provider]  Active Self  nitroGLYCERIN (NITROSTAT) 0.4 MG SL tablet [id number]  Place 1 tablet (0.4 mg total) under the tongue every 5 (five) minutes as needed for chest pain. Dixie Dials, MD  Active Self  [facility] LANCETS FINE [place] [id number]  Use to check sugar daily Philemon Kingdom, MD  Active Self  Roma Schanz test strip [id number]  USE 1 STRIP TO CHECK GLUCOSE ONCE DAILY Philemon Kingdom, MD  Active   Semaglutide,0.25 or 0.5MG/DOS,  (OZEMPIC, 0.25 OR 0.5 MG/DOSE,) 2 MG/1.5ML SOPN [id number] Yes Inject 0.5 mg into the skin once a week.  Patient taking differently: Inject 0.25 mg into the skin once a week.   Philemon Kingdom, MD Taking Active   spironolactone (ALDACTONE) 25 MG tablet [id number]  Take 25 mg by mouth in the morning. [provider]  Active Self  tamsulosin (FLOMAX) 0.4 MG CAPS capsule [id number]  TAKE 1 CAPSULE BY MOUTH  DAILY [name], NP  Active             Patient Active Problem List   Diagnosis Date Noted   Acquired trigger finger of right index finger 05/23/2021   Cellulitis 03/22/2021   Neck mass 01/08/2021   Atrial fibrillation ([place]) 10/25/2020   Parotid mass 10/25/2020   Epistaxis 09/29/2018   Hyperkalemia 09/29/2018   AKI (acute kidney injury) ([place]) 09/29/2018   Lumbar radiculopathy 08/06/2017   Greater trochanteric bursitis of left hip 07/04/2017   Rotator cuff arthropathy of left shoulder 01/24/2017   AC (acromioclavicular) arthritis 01/24/2017   Vitamin B12 deficiency 01/18/2016   NSTEMI (non-ST elevated myocardial infarction) ([place]) 10/19/2015   Acute non Q wave MI (myocardial infarction), initial episode of care ([place]) 10/19/2015   Peripheral neuropathy 10/13/2015   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  [place]) 07/14/2015   Actinic keratosis of right cheek 10/04/2014   Contact dermatitis and eczema 02/10/2014   Secondary renovascular hypertension, 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   Acute blood loss anemia 08/27/2013   Insomnia 08/27/2013   Hip fracture, right ([place]) 07/25/2013   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   Renal cell carcinoma ([place]) 07/25/2013   History of colonic polyps 07/20/2012   Hyperlipidemia 11/13/2007   Obesity 11/13/2007   Essential hypertension 04/23/2007    Immunization History  Administered Date(s) Administered   Fluad Quad(high Dose 65+) 07/05/2019   Influenza Split 07/18/2011, 07/06/2012, 07/07/2012   Influenza Whole 06/09/2006,  06/17/2008, 07/10/2009, 05/18/2010   Influenza, High Dose Seasonal PF 06/11/2016, 06/06/2017, 05/26/2018   Influenza,inj,Quad PF,6+ Mos 06/14/2013, 06/02/2014, 07/14/2015   Influenza-Unspecified 06/27/2020   PFIZER(Purple Top)SARS-COV-2 Vaccination 09/28/2019, 10/19/2019, 06/18/2020   Pneumococcal Conjugate-13 07/14/2014   Pneumococcal Polysaccharide-23 06/09/2006   Td 09/09/2001   Tdap 02/20/2012   Zoster Recombinat (Shingrix) 09/15/2021, 11/24/2021   Patient reports he had a [place] of weeks at the beach in [place] and really enjoyed his time. The water wasn't friendly but he had a lot of good seafood and enjoyed the sun.   Patient is not happy with the amiodarone elevating his TSH and does not want to add another medication to lower this. He has an appt with cardiology this week to discuss stopping the amiodarone. Discussed how without symptoms of hypothyroidism, would not recommend starting levothyroxine unless TSH is > 10.   Conditions to be addressed/monitored:  Hypertension, Hyperlipidemia, Diabetes and Atrial Fibrillation  Conditions addressed this visit: Diabetes, hypertension  Care Plan : CCM Pharmacy Care Plan  Updates made by Viona Gilmore, [place] since 05/28/2022 12:00 AM     Problem: Problem: Hypertension, Hyperlipidemia, Diabetes and Atrial Fibrillation      Long-Range Goal: Patient-Specific Goal   Start Date: 01/12/2021  Expected End Date: 01/12/2022  Recent Progress: On track  Priority: High  Note:   Current Barriers:  Unable to independently afford treatment regimen Unable to independently monitor therapeutic efficacy  Pharmacist Clinical Goal(s):  Patient will verbalize ability to afford treatment regimen maintain control of diabetes as evidenced by A1c  through collaboration with PharmD and provider.   Interventions: 1:1 collaboration with [name], NP regarding development and update of comprehensive plan of care as evidenced by provider attestation  and co-signature Inter-disciplinary care team collaboration (see longitudinal plan of care) Comprehensive medication review performed; medication list updated in electronic medical record  Hypertension (BP goal <140/90) -Controlled -Current treatment: Metoprolol succinate (Toprol-XL) 111m, 1 tablet in the morning and 1/2 tablet - Appropriate, Effective, Safe, Accessible Spironolactone 25 mg 1 tablet in the morning - Appropriate, Effective, Safe, Accessible -Medications previously tried: lisinopril (switched to amiodarone)  -Current home readings: 110/62, 102/62, 135/70; usually 120/70-80s ([facility] or sometimes once a day) -Current dietary habits: cut down on "bad foods" -Current exercise habits: going to the [employer] Mon-Fri -Denies hypotensive/hypertensive symptoms -Educated on Daily salt intake goal < 2300 mg; Exercise goal of 150 minutes per week; Importance of home blood pressure monitoring; Proper BP monitoring technique; -Counseled to monitor BP at home weekly, document, and provide log at future appointments -Counseled on diet and exercise extensively Recommended to continue current medication Recommended checking BP before exercise  Hyperlipidemia: (LDL goal < 55) -Not ideally controlled -Current treatment: Atorvastatin 40 mg 1 tablet daily - Appropriate, Query effective, Safe, Accessible -Medications previously tried: none  -Current dietary patterns: cut down on "bad foods" -Current exercise  habits: exercising at the [facility] -Educated on Cholesterol goals;  Benefits of statin for ASCVD risk reduction; Importance of limiting foods high in cholesterol; -Counseled on diet and exercise extensively Recommended to continue current medication  Diabetes (A1c goal <7%) -Controlled -Current medications: Glipizide 2.5 mg 1 tablet daily - Appropriate, Effective, Safe, Accessible Metformin 1000 mg 1 tablet twice daily - Appropriate, Effective, Safe,  Accessible Ozempic 0.25 mg inject once a week - Appropriate, Effective, Safe, Accessible -Medications previously tried: Ozempic (cost), Rybelsus (cost)  -Current home glucose readings fasting glucose: 91, 118 post prandial glucose: 185 (sometimes checking) -Denies hypoglycemic/hyperglycemic symptoms -Current meal patterns:  breakfast: did not discuss  lunch: did not discuss; goes out to eat lunch 2-3 times a week dinner: salads, sandwich; usually around 6-8 pm snacks: grape tomatoes, PB and crackers drinks: did not discuss -Current exercise: going to the [employer] Mon-Fri; swimming some -Educated on A1c and blood sugar goals; Exercise goal of 150 minutes per week; Benefits of routine self-monitoring of blood sugar; Carbohydrate counting and/or plate method -Counseled to check feet daily and get yearly eye exams -Counseled on diet and exercise extensively Recommended to continue current medication Counseled on rotating times of day when checking blood sugars to give a better picture of the whole day instead of just fasting  Atrial Fibrillation (Goal: prevent stroke and major bleeding) -Controlled -CHADSVASC: 5 -Current treatment: Rate control: metoprolol succinate 100 mg 1 tablet in the morning and 1/2 tablet in the evening - Appropriate, Effective, Safe, Accessible; amiodarone 200 mg 1 tablet daily - Appropriate, Effective, Safe, Accessible Anticoagulation: Eliquis 5 mg 1 tablet twice daily  - Appropriate, Effective, Safe, Accessible -Medications previously tried: none -Home BP and HR readings: does not monitor at home  -Counseled on importance of adherence to anticoagulant exactly as prescribed; bleeding risk associated with Eliquis and importance of self-monitoring for signs/symptoms of bleeding; avoidance of NSAIDs due to increased bleeding risk with anticoagulants; -Recommended to continue current medication Educated on not treating TSH unless above > 10 or symptomatic.  BPH  (Goal: minimize symptoms of enlarged prostate) -Controlled -Current treatment  Tamsulosin 0.4 mg 1 capsule daily - Appropriate, Effective, Safe, Accessible -Medications previously tried: none  -Recommended to continue current medication  History of NSTEMI (Goal: prevent future events) -Controlled -Current treatment  Aspirin 81 mg 1 tablet daily - Appropriate, Effective, Safe, Accessible Nitroglycerin 0.4 mg SL tablet - Appropriate, Effective, Safe, Accessible -Medications previously tried: none  -Recommended to continue current medication   Health Maintenance -Vaccine gaps: shingles -Current therapy:  Preservision Areds 2 1 capsule in the morning and at bedtime Vitamin B12 5000 mcg 1 SL tablet three days a week Vitamin D 2000 units daily -Educated on Cost vs benefit of each product must be carefully weighed by individual consumer -Patient is satisfied with current therapy and denies issues -Counseled on recommendations from Dr. Cruzita Lederer about taking vitamin B12 2500 mcg daily instead of 5000 mcg every other day.   Patient Goals/Self-Care Activities Patient will:  - take medications as prescribed check glucose daily, document, and provide at future appointments check blood pressure weekly, document, and provide at future appointments target a minimum of 150 minutes of moderate intensity exercise weekly  Follow Up Plan: Telephone follow up appointment with care management team member scheduled for: 6 months      Medication Assistance:  Patient does not qualify for any patient assistance programs due to income  Compliance/Adherence/Medication fill history: Care Gaps: COVID booster, shingrix, urine ACR, tetanus, influenza Last BP -  130/100 on 05/14/2022 Last A1C - 5.9 on 05/08/2022   Star-Rating Drugs: Atorvastatin 40 mg - last filled 02/26/2022 100 DS at [facility] Glipizide 5 mg - last filled 05/09/2022 100 DS at [facility] Metformin 1000 mg - last filled 02/11/2022 100 DS at  [facility] 11m/1.5ml - last filled 12/19/2021 [facility] verified with AEstill Bamberg Patient's preferred pharmacy is:  [facility] ([place] [id number]: [phone]: [phone]([place], [place] - [address] [place] ([place] [address]Phone: [phone]: [phone] [facility]) - [place] [address] [address]Phone: [phone]: [phone]  Uses pill box? Yes Pt endorses 100% compliance  We discussed: Current pharmacy is preferred with insurance plan and patient is satisfied with pharmacy services Patient decided to: Continue current medication management strategy  Care Plan and Follow Up Patient Decision:  Patient agrees to Care Plan and Follow-up.  Plan: Telephone follow up appointment with care management team member scheduled for:  6 months  MJeni Salles PharmD BSuttonPharmacist LCommerce Cityat [phone]

## 2022-05-28 ENCOUNTER — Encounter: Payer: Self-pay | Admitting: *Deleted

## 2022-05-28 ENCOUNTER — Ambulatory Visit (INDEPENDENT_AMBULATORY_CARE_PROVIDER_SITE_OTHER): Payer: Medicare Other | Admitting: Pharmacist

## 2022-05-28 DIAGNOSIS — E1165 Type 2 diabetes mellitus with hyperglycemia: Secondary | ICD-10-CM

## 2022-05-28 DIAGNOSIS — I10 Essential (primary) hypertension: Secondary | ICD-10-CM

## 2022-05-28 NOTE — Progress Notes (Signed)
[facility] Quality Team Note  Name: Charles [name] Date of Birth: 01/01/1941 MRN: [id number] Date: 05/28/2022  [facility] Quality Team has reviewed this patient's chart, please see recommendations below:  [facility] Quality Other; (Called pt to see if interested in having labs for KED gap.  He would need urine albumin creatinine ratio test ordered or I can send out home kit.  LVM for pt to call me back.  Sent note in case he calls office.)    

## 2022-05-28 NOTE — Patient Instructions (Signed)
Hi Charles [name],  It was great to catch up again! I just emailed you the Xarelto savings program information so let me know if you didn't receive it or have any questions if your cardiologist switches you to that.  Please reach out to me if you have any questions or need anything before our follow up!  Best, Charles [name]  Charles [name], PharmD, [facility]   Visit Information   Goals Addressed   None    Patient Care Plan: RNCM:Atrial Fibrillation (Adult)  Completed 08/24/2021   Problem Identified: Lack of self mangement plan for Atrial Fibrillation Resolved 08/24/2021  Priority: High     Long-Range Goal: RNCM: Develolp and self manage atrial fibrillation Completed 08/24/2021  Start Date: 10/18/2020  Expected End Date: 12/08/2021  Recent Progress: On track  Priority: High  Note:   Resolving due to duplicate goal  Current Barriers:  Knowledge Deficits related to new dx of atrial fibrillation with hx of HTN and HLD -Reports no atrial fibrillation symptoms. Denies any shortness of breath,chest pain or swelling.  Reports taking Eliquis as directed.  States he saw Dr. Terrence Dupont in September and he did not change any of his medications. States he is to see him again in January. Denies any swelling and his weight has been stable.  States his B/P usually good when he checks it at the [facility].    Chronic Disease Management support and education needs related to Atrial fibrillation Nurse Case Manager Clinical Goal(s):  Over the next 120 days, patient will verbalize understanding of plan for self management of atrial fibrillation Over the next 120 days, patient will verbalize basic understanding of atrial fibrillation disease process and self health management plan as evidenced by decrease in symptoms, adherence to medications, keeping provider appts  Interventions:  1:1 collaboration with [name], NP regarding development and update of  comprehensive plan of care as evidenced by provider attestation and co-signature Inter-disciplinary care team collaboration (see longitudinal plan of care)  Reinforced patient to call provider for worsening symptoms of atrial fibrillation  Reinforced education to patient re: atrial fibrillation disease process and atrial fibrillation action plan  Reviewed medications with patient and discussed importance of taking medications as ordered to lower heart rate and taking anticoagulant Reviewed scheduled/upcoming provider appointments including: Dr. Terrence Dupont cardiology January 2023, Dr. Cruzita Lederer 08/20/21 Discussed plans with patient for ongoing care management follow up and provided patient with direct contact information for care management team Reinforced to keep a log of his weights and B/P to take to his appt with Dr.Harwani Patient Goals/Self-Care Activities Patient will attend all scheduled provider appointments Patient will call provider office for new concerns or questions - check pulse (heart) rate once a day - cut down alcohol use - make a plan to exercise regularly - make a plan to eat healthy - take medicine as prescribed -review atrial fibrillation action plan -take anticoagulant to prevent stroke -take log of B/P and weights to cardiology visit Follow Up Plan: Telephone follow up appointment with care management team member scheduled for: 08/24/21 at 9 AM The patient has been provided with contact information for the care management team and has been advised to call with any health related questions or concerns.          Patient Care Plan: RNCM:Diabetes Type 2 (Adult)  Completed 08/24/2021   Problem Identified: RNCM: Lack of effective self management of Diabetes Type 2 Resolved 08/24/2021  Priority: Medium     Long-Range Goal:  Effective self management of blood sugars and Diabetes Type 2 Completed 08/24/2021  Start Date: 10/18/2020  Expected End Date: 12/08/2021  Recent  Progress: On track  Priority: Medium  Note:   Resolving due to duplicate goal  Objective:  Lab Results  Component Value Date   HGBA1C 7.9 (A) 08/21/2020   Lab Results  Component Value Date   CREATININE 1.06 10/06/2020   CREATININE 1.02 10/08/2019   CREATININE 1.21 10/26/2018  Current Barriers:  Knowledge Deficits related to basic Diabetes pathophysiology and self care/management with hx of HTN and atrial fib.  Does not adhere to provider recommendations re: Low CHO diet and portion control. States his fasting readings range from 105-160.  States he only has the higher readings once or twice a week with most readings in the low 100's.  States he can not identify anything different on the days his CBG is higher.  States his diet and exercise are the same  States he was checking his after meal readings a few times but has not checked much recently. Denies any hypoglycemia.  States he is watching portions better and avoiding second helpings.  States he had been eating more vegetables in the summer.  States he is still going to gym 5 days a week walking 1-2 miles and working on IT trainer.  States he has had an issue with his rt hand and saw sports medicine for his trigger finger.  States he might need a cortisone shot in his hand Case Manager Clinical Goal(s):  patient will demonstrate improved adherence to prescribed treatment plan for diabetes self care/management as evidenced by: daily monitoring and recording of CBG  adherence to ADA/ carb modified diet exercise 3 days/week adherence to prescribed medication regimen Interventions:  Collaboration with [name], NP regarding development and update of comprehensive plan of care as evidenced by provider attestation and co-signature Inter-disciplinary care team collaboration (see longitudinal plan of care) Reinforced education to patient about basic DM disease process Reviewed medications with patient and discussed importance of  medication adherence Discussed plans with patient for ongoing care management follow up and provided patient with direct contact information for care management team Reviewed scheduled/upcoming provider appointments including: Dr. Terrence Dupont cardiology January 2023, Dr. Cruzita Lederer endocrinologist 08/20/21, Dr. Tamala Julian sports med 08/08/21 Reinforced  to check cbg daily at varying times and record, calling provider for findings outside established parameters.   Reinforced importance of getting regular exercise with resistance training at least 2 times a week Reinforced the rule of 15 for hypoglycemia and discussed sources of fast acting sugar to have on hand Reviewed how cortisone shot can increase his blood sugar temporarily  Patient Goals/Self-Care Activities Over the next 90 days, patient will:  - Self administers oral medications as prescribed Attends all scheduled provider appointments Checks blood sugars as prescribed and utilize hyper and hypoglycemia protocol as needed Adheres to prescribed ADA/carb modified - check blood sugar at prescribed times try checking 1 1/2-2 hours after eating -try checking blood sugar after eating at different times -recheck blood sugar if you get an unexpected number - check blood sugar before and after exercise - check blood sugar if I feel it is too high or too low - enter blood sugar readings and medication or insulin into daily log - take the blood sugar log to all doctor visits - take the blood sugar meter to all doctor visits  -keep glucose tablets or some other fast acting sugar on hand to treat low blood sugars Follow Up  Plan: Telephone follow up appointment with care management team member scheduled for: 08/24/21 at 9 AM The patient has been provided with contact information for the care management team and has been advised to call with any health related questions or concerns.      Patient Care Plan: CCM Pharmacy Care Plan     Problem Identified:  Problem: Hypertension, Hyperlipidemia, Diabetes and Atrial Fibrillation      Long-Range Goal: Patient-Specific Goal   Start Date: 01/12/2021  Expected End Date: 01/12/2022  Recent Progress: On track  Priority: High  Note:   Current Barriers:  Unable to independently afford treatment regimen Unable to independently monitor therapeutic efficacy  Pharmacist Clinical Goal(s):  Patient will verbalize ability to afford treatment regimen maintain control of diabetes as evidenced by A1c  through collaboration with PharmD and provider.   Interventions: 1:1 collaboration with [name], NP regarding development and update of comprehensive plan of care as evidenced by provider attestation and co-signature Inter-disciplinary care team collaboration (see longitudinal plan of care) Comprehensive medication review performed; medication list updated in electronic medical record  Hypertension (BP goal <140/90) -Controlled -Current treatment: Metoprolol succinate (Toprol-XL) 165m, 1 tablet in the morning and 1/2 tablet - Appropriate, Effective, Safe, Accessible Spironolactone 25 mg 1 tablet in the morning - Appropriate, Effective, Safe, Accessible -Medications previously tried: lisinopril (switched to amiodarone)  -Current home readings: 110/62, 102/62, 135/70; usually 120/70-80s [facility] -Current dietary habits: cut down on "bad foods" -Current exercise habits: going to the [employer] Mon-Fri -Denies hypotensive/hypertensive symptoms -Educated on Daily salt intake goal < 2300 mg; Exercise goal of 150 minutes per week; Importance of home blood pressure monitoring; Proper BP monitoring technique; -Counseled to monitor BP at home weekly, document, and provide log at future appointments -Counseled on diet and exercise extensively Recommended to continue current medication Recommended checking BP before exercise  Hyperlipidemia: (LDL goal <  55) -Not ideally controlled -Current treatment: Atorvastatin 40 mg 1 tablet daily - Appropriate, Query effective, Safe, Accessible -Medications previously tried: none  -Current dietary patterns: cut down on "bad foods" -[facility]Educ[facility] on Cholesterol goals;  Benefits of statin for ASCVD risk reduction; Importance of limiting foods high in cholesterol; -Counseled on diet and exercise extensively Recommended to continue current medication  Diabetes (A1c goal <7%) -Controlled -Current medications: Glipizide 2.5 mg 1 tablet daily - Appropriate, Effective, Safe, Accessible Metformin 1000 mg 1 tablet twice daily - Appropriate, Effective, Safe, Accessible Ozempic 0.25 mg inject once a week - Appropriate, Effective, Safe, Accessible -Medications previously tried: Ozempic (cost), Rybelsus (cost)  -Current home glucose readings fasting glucose: 91, 118 post prandial glucose: 185 (sometimes checking) -Denies hypoglycemic/hyperglycemic symptoms -Current meal patterns:  breakfast: did not discuss  lunch: did not discuss; goes out to eat lunch 2-3 times a week dinner: salads, sandwich; usually around 6-8 pm snacks: grape tomatoes, PB and crackers drinks: did not discuss -Current exercise: going to the [employer] Mon-Fri; swimming some -Educated on A1c and blood sugar goals; Exercise goal of 150 minutes per week; Benefits of routine self-monitoring of blood sugar; Carbohydrate counting and/or plate method -Counseled to check feet daily and get yearly eye exams -Counseled on diet and exercise extensively Recommended to continue current medication Counseled on rotating times of day when checking blood sugars to give a better picture of the whole day instead of just fasting  Atrial Fibrillation (Goal: prevent stroke and major bleeding) -Controlled -CHADSVASC: 5 -  Current treatment: Rate control: metoprolol succinate 100 mg 1 tablet in the morning and 1/2  tablet in the evening - Appropriate, Effective, Safe, Accessible; amiodarone 200 mg 1 tablet daily - Appropriate, Effective, Safe, Accessible Anticoagulation: Eliquis 5 mg 1 tablet twice daily  - Appropriate, Effective, Safe, Accessible -Medications previously tried: none -Home BP and HR readings: does not monitor at home  -Counseled on importance of adherence to anticoagulant exactly as prescribed; bleeding risk associated with Eliquis and importance of self-monitoring for signs/symptoms of bleeding; avoidance of NSAIDs due to increased bleeding risk with anticoagulants; -Recommended to continue current medication Educated on not treating TSH unless above > 10 or symptomatic.  BPH (Goal: minimize symptoms of enlarged prostate) -Controlled -Current treatment  Tamsulosin 0.4 mg 1 capsule daily - Appropriate, Effective, Safe, Accessible -Medications previously tried: none  -Recommended to continue current medication  History of NSTEMI (Goal: prevent future events) -Controlled -Current treatment  Aspirin 81 mg 1 tablet daily - Appropriate, Effective, Safe, Accessible Nitroglycerin 0.4 mg SL tablet - Appropriate, Effective, Safe, Accessible -Medications previously tried: none  -Recommended to continue current medication   Health Maintenance -Vaccine gaps: shingles -Current therapy:  Preservision Areds 2 1 capsule in the morning and at bedtime Vitamin B12 5000 mcg 1 SL tablet three days a week Vitamin D 2000 units daily -Educated on Cost vs benefit of each product must be carefully weighed by individual consumer -Patient is satisfied with current therapy and denies issues -Counseled on recommendations from Dr. Cruzita Lederer about taking vitamin B12 2500 mcg daily instead of 5000 mcg every other day.   Patient Goals/Self-Care Activities Patient will:  - take medications as prescribed check glucose daily, document, and provide at future appointments check blood pressure weekly, document,  and provide at future appointments target a minimum of 150 minutes of moderate intensity exercise weekly  Follow Up Plan: Telephone follow up appointment with care management team member scheduled for: 6 months     Patient Care Plan: [place] of Care  Completed 03/08/2022   Problem Identified: Chronic Disease Management and Care Coordination Needs (DM. Afib,HTN and HLD) Resolved 03/08/2022  Priority: High     Long-Range Goal: Establish Plan of Care for Chronic Disease Management Needs (DM. Afib,HTN and HLD) Completed 03/08/2022  Start Date: 08/24/2021  Expected End Date: 12/28/2022  Priority: High  Note:   RNCM case closed goals met Current Barriers:  Knowledge Deficits related to plan of care for management of Atrial Fibrillation, HTN, HLD, and DMII  Chronic Disease Management support and education needs related to Atrial Fibrillation, HTN, HLD, and DMII  States he has been taking the lower dose of Ozempic and he is not having any side effects.  States he plans to stay on the lower dose due to cost and it seems to be working. States he is eating less and has not been snacking.  States he has lost 12 lbs.   States his fasting readings range from 78-120 and 90-120 later in the day. States he did have the feeling that he was going low when he was at the [facility] which he treated with hard candy and he felt better. States he is now taking 1/2 glipizide 5 mg twice day. States he is still going to gym 5 days a week walking 1-2 miles and working on IT trainer. Deneis any chest pains, shortness of breath or swelling.  States his B/P is good when he checks at the [facility].  States he saw cardiology in May  and he was still in Atrial fib.  States he had no symptoms of being out of rhythm but he did notice having some shortness of breath when walking fast on the track at the [facility].  States he is to go back to Dr. Terrence Dupont at the end of June.  States he had his eye appointment in May. States  his B/P has been lower when he checks it at the [facility] and last reading was 105/68  RNCM Clinical Goal(s):  Patient will verbalize understanding of plan for management of Atrial Fibrillation, HTN, HLD, and DMII as evidenced by voiced adherence to plan of care verbalize basic understanding of  Atrial Fibrillation, HTN, HLD, and DMII disease process and self health management plan as evidenced by voiced understanding and teach back take all medications exactly as prescribed and will call provider for medication related questions as evidenced by dispense report and pt verbalization attend all scheduled medical appointments: Annual wellness visit 12/30/22, CCM PharmD 05/28/22, endocrinology 04/24/22, cardiology late June as evidenced by medical records demonstrate Improved adherence to prescribed treatment plan for Atrial Fibrillation, HTN, HLD, and DMII as evidenced by readings within limits, voiced adherence to plan of care continue to work with RN Care Manager to address care management and care coordination needs related to  Atrial Fibrillation, HTN, HLD, and DMII as evidenced by adherence to CM Team Scheduled appointments through collaboration with RN Care manager, provider, and care team.   Interventions: 1:1 collaboration with primary care provider regarding development and update of comprehensive plan of care as evidenced by provider attestation and co-signature Inter-disciplinary care team collaboration (see longitudinal plan of care) Evaluation of current treatment plan related to  self management and patient's adherence to plan as established by provider   Health Maintenance Interventions:  (Status:  Goal Met.) Long Term Goal Patient interviewed about adult health maintenance status including  Diabetes Eye Exam    Advised patient to discuss  Diabetes Eye Exam    with primary care provider    AFIB Interventions: (Status:  Goal Met.) Long Term Goal   Counseled on increased risk of stroke due to  Afib and benefits of anticoagulation for stroke prevention Reviewed importance of adherence to anticoagulant exactly as prescribed Counseled on bleeding risk associated with Eliquis and importance of self-monitoring for signs/symptoms of bleeding Counseled on seeking medical attention after a head injury or if there is blood in the urine/stool Reinforced to look into getting KardiaMobile or Apple watch to monitor his atrial fib.  Reinforced to call provider if he has any sx .Reviewed to keep appointment with cardiology. Reviewed to discuss with provider about referral to different cardiologist if he decides he wants to change   Diabetes Interventions:  (Status:  Goal Met.) Long Term Goal Assessed patient's understanding of A1c goal: <7% Provided education to patient about basic DM disease process Reviewed medications with patient and discussed importance of medication adherence Counseled on importance of regular laboratory monitoring as prescribed Discussed plans with patient for ongoing care management follow up and provided patient with direct contact information for care management team Provided patient with written educational materials related to hypo and hyperglycemia and importance of correct treatment Advised patient, providing education and rationale, to check cbg daily at different times and record, calling provider for findings outside established parameters Review of patient status, including review of consultants reports, relevant laboratory and other test results, and medications completed Reinforced use and possible side effects of Ozempic.  Reinforced to notify Dr. Cruzita Lederer  if he starts to have low blood sugars.  Lab Results  Component Value Date   HGBA1C 7.7 (A) 12/19/2021   Hyperlipidemia Interventions:  (Status:  Goal Met.) Long Term Goal Medication review performed; medication list updated in electronic medical record.  Provider established cholesterol goals  reviewed Counseled on importance of regular laboratory monitoring as prescribed Reviewed role and benefits of statin for ASCVD risk reduction Reviewed importance of limiting foods high in cholesterol Reviewed exercise goals and target of 150 minutes per week Reinforced to discuss lipid values with provider and endocrinologist and need to get lipids tested while fasting  Hypertension Interventions:  (Status:  Goal Met.) Long Term Goal Last practice recorded BP readings:  BP Readings from Last 3 Encounters:  12/19/21 140/90  10/12/21 120/80  10/09/21 132/80  Most recent eGFR/CrCl: No results found for: EGFR  No components found for: CRCL  Evaluation of current treatment plan related to hypertension self management and patient's adherence to plan as established by provider Provided education to patient re: stroke prevention, s/s of heart attack and stroke Counseled on the importance of exercise goals with target of 150 minutes per week Discussed plans with patient for ongoing care management follow up and provided patient with direct contact information for care management team Advised patient, providing education and rationale, to monitor blood pressure daily and record, calling PCP for findings outside established parameters Provided education on prescribed diet low sodium low CHO Reinforced to call provider if his B/P are too high or low  Patient Goals/Self-Care Activities: Take all medications as prescribed Attend all scheduled provider appointments Call pharmacy for medication refills 3-7 days in advance of running out of medications Perform all self care activities independently  Perform IADL's (shopping, preparing meals, housekeeping, managing finances) independently Call provider office for new concerns or questions  keep appointment with eye doctor check blood sugar at prescribed times: once daily and when you have symptoms of low or high blood sugar check feet daily for cuts,  sores or redness take the blood sugar log to all doctor visits drink 6 to 8 glasses of water each day fill half of plate with vegetables limit fast food meals to no more than 1 per week manage portion size wash and dry feet carefully every day check pulse (heart) rate once a day make a plan to exercise regularly make a plan to eat healthy take medicine as prescribed check blood pressure 3 times per week choose a place to take my blood pressure (home, clinic or office, retail store) take blood pressure log to all doctor appointments call doctor for signs and symptoms of high blood pressure keep all doctor appointments eat more whole grains, fruits and vegetables, lean meats and healthy fats limit salt intake to 2324m/day call for medicine refill 2 or 3 days before it runs out take all medications exactly as prescribed call doctor with any symptoms you believe are related to your medicine  Follow Up Plan:  The patient has been provided with contact information for the care management team and has been advised to call with any health related questions or concerns.  No further follow up required: RNCM case closed goals met        Patient verbalizes understanding of instructions and care plan provided today and agrees to view in [employer] [facility] MyChart status and patient understanding of how to access instructions and care plan via MyChart confirmed with patient.    Telephone follow up appointment with pharmacy team member scheduled  for: 6 months  Viona Gilmore, [facility]

## 2022-05-30 DIAGNOSIS — H34811 Central retinal vein occlusion, right eye, with macular edema: Secondary | ICD-10-CM | POA: Diagnosis not present

## 2022-05-30 DIAGNOSIS — H35372 Puckering of macula, left eye: Secondary | ICD-10-CM | POA: Diagnosis not present

## 2022-05-30 DIAGNOSIS — H353133 Nonexudative age-related macular degeneration, bilateral, advanced atrophic without subfoveal involvement: Secondary | ICD-10-CM | POA: Diagnosis not present

## 2022-05-30 DIAGNOSIS — E113293 Type 2 diabetes mellitus with mild nonproliferative diabetic retinopathy without macular edema, bilateral: Secondary | ICD-10-CM | POA: Diagnosis not present

## 2022-05-31 DIAGNOSIS — E119 Type 2 diabetes mellitus without complications: Secondary | ICD-10-CM | POA: Diagnosis not present

## 2022-05-31 DIAGNOSIS — I4892 Unspecified atrial flutter: Secondary | ICD-10-CM | POA: Diagnosis not present

## 2022-05-31 DIAGNOSIS — I480 Paroxysmal atrial fibrillation: Secondary | ICD-10-CM | POA: Diagnosis not present

## 2022-05-31 DIAGNOSIS — I10 Essential (primary) hypertension: Secondary | ICD-10-CM | POA: Diagnosis not present

## 2022-06-08 DIAGNOSIS — Z7984 Long term (current) use of oral hypoglycemic drugs: Secondary | ICD-10-CM

## 2022-06-08 DIAGNOSIS — E785 Hyperlipidemia, unspecified: Secondary | ICD-10-CM

## 2022-06-08 DIAGNOSIS — Z7985 Long-term (current) use of injectable non-insulin antidiabetic drugs: Secondary | ICD-10-CM

## 2022-06-08 DIAGNOSIS — N400 Enlarged prostate without lower urinary tract symptoms: Secondary | ICD-10-CM

## 2022-06-08 DIAGNOSIS — E1165 Type 2 diabetes mellitus with hyperglycemia: Secondary | ICD-10-CM

## 2022-06-08 DIAGNOSIS — I10 Essential (primary) hypertension: Secondary | ICD-10-CM | POA: Diagnosis not present

## 2022-06-13 ENCOUNTER — Ambulatory Visit: Payer: Medicare Other | Admitting: Internal Medicine

## 2022-06-23 NOTE — H&P (View-Only) (Signed)
Cardiology Office Note:    Date:  06/24/2022   ID:  Charles [name], DOB 07-22-1941, MRN [id number]  PCP:  Charles Peng, NP   [place] Providers Cardiologist:  None Electrophysiologist:  Charles Quitter, MD     Referring MD: Charles Peng, NP   Chief complaint: fatigue  History of Present Illness:    Charles [name] is a 81 y.o. male with a hx of coronary disease status post myocardial infarction 10/2015, history of adenomatous polyps type 2 diabetes, hypertension, for for arrhythmia management.  Atrial fibrillation is documented on an ECG from 10/20/2015. This occurred at the time of an admission for NSTEMI. I do not have records from that admission otherwise.  The patient recalls that he had an episode of atrial fibrillation about a year ago.  At that time, he was very fatigued and noticed that he was not able to walk from the parking lot into the [facility] where he exercises regularly.  He was started on amiodarone by his cardiologist, Dr. Terrence [name] sometime around March 2023.  Per the patient's recollection, this is because he had recurrence of atrial fibrillation.    When he saw his endocrinologist in August, he expressed a concern that amiodarone could affect his thyroid levels.  His T4 and T3 returned within normal limits, but the TSH was significantly elevated at 9.93. He was advised to discuss amiodarone discontinuation with cardiology.   He was seen by Dr. Terrence [name], and according to the patient, was noted to be in atrial flutter at that time.  He was subsequently referred to EP.  He does not have significant palpitations, but he does feel short of breath and fatigue.  He is not able to exercise to the extent he was previously.   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[place]) 2000   left nephrectomy   Coronary  artery disease    Dysrhythmia    PAF   Eczema    GERD (gastroesophageal reflux disease)    Hernia, ventral    History of blood transfusion    Hyperlipidemia    Hypertension    Myocardial infarction ([place])    Neuropathy    Obesity    Personal history of adenomatous colonic polyps 07/20/2012   3 + adenomas 2009 07/20/2012 2 diminutive polyps     Pneumonia 1942; 1950s X 1   Type II diabetes mellitus ([place])    type II   Walking pneumonia 2000's X 1    Past Surgical History:  Procedure Laterality Date   CARDIAC CATHETERIZATION N/A 10/19/2015   Procedure: Left Heart Cath and Coronary Angiography;  Surgeon: Charles Forward, MD;  Location: [place] [facility];  Service: Cardiovascular;  Laterality: N/A;   CARDIAC CATHETERIZATION N/A 10/19/2015   Procedure: Coronary Stent Intervention;  Surgeon: Charles Forward, MD;  Location: [place] [facility];  Service: Cardiovascular;  Laterality: N/A;   CATARACT EXTRACTION, BILATERAL Bilateral 01/2018   Charles [name] did surgery and he developed MD and is seeing specialist now for MD treatment   COLONOSCOPY     "nothing showed up this time"   COLONOSCOPY W/ BIOPSIES AND POLYPECTOMY  X 1   CORONARY ANGIOPLASTY     EXCISION MASS NECK Left 01/08/2021   Procedure: EXCISION MASS NECK;  Surgeon: Charles Quitter, MD;  Location: [facility];  Service: ENT;  Laterality: Left;   FEMUR IM NAIL Right 07/26/2013   Procedure: INTRAMEDULLARY (IM) NAIL FEMORAL subtrochanteric;  Surgeon: Charles Pole, MD;  Location: [facility];  Service: Orthopedics;  Laterality: Right;   FRACTURE SURGERY     INGUINAL HERNIA REPAIR Left >3 times   INGUINAL HERNIA REPAIR Right 2012   NEPHRECTOMY Left 2000   TONSILLECTOMY  1940s    Current Medications: Current Meds  Medication Sig   amiodarone (PACERONE) 200 MG tablet Take 200 mg by mouth daily.   aspirin 81 MG chewable tablet Chew 81 mg by mouth in the morning.   atorvastatin (LIPITOR) 40 MG tablet Take 1 tablet (40 mg total) by mouth daily at 6  PM.   Cholecalciferol (VITAMIN D-3 PO) Take 5,000 Units by mouth in the morning.   Cyanocobalamin (VITAMIN B-12) 5000 MCG SUBL Place 5,000 mcg under the tongue every 3 (three) days.   ELIQUIS 5 MG TABS tablet Take 5 mg by mouth 2 (two) times daily.   EPINEPHrine (EPI-PEN) 0.3 mg/0.3 mL DEVI Inject 0.3 mg into the muscle daily as needed (allergic reaction).   gabapentin (NEURONTIN) 100 MG capsule TAKE 1 CAPSULE BY MOUTH AT  BEDTIME   glipiZIDE (GLUCOTROL) 5 MG tablet TAKE 1 TABLET BY MOUTH  TWICE DAILY BEFORE A MEAL (Patient taking differently: 2.5 mg.)   Insulin Pen Needle (PEN NEEDLES) 32G X 4 MM MISC Use as instructed to administer weekly injection.   Insulin Syringes, Disposable, U-100 0.5 ML MISC Use 1x a day   levothyroxine (SYNTHROID) 25 MCG tablet Take 25 mcg by mouth daily.   metFORMIN (GLUCOPHAGE) 1000 MG tablet Take 1 tablet (1,000 mg total) by mouth 2 (two) times daily with a meal.   metoprolol succinate (TOPROL-XL) 100 MG 24 hr tablet Take 50-100 mg by mouth See admin instructions. Take 1 tablet (100 mg) gy mouth in the morning & take 0.5 tablet (50 mg) by mouth in the evening.   Multiple Vitamins-Minerals (PRESERVISION AREDS 2 PO) Take 1 tablet by mouth in the morning and at bedtime.   nitroGLYCERIN (NITROSTAT) 0.4 MG SL tablet Place 1 tablet (0.4 mg total) under the tongue every 5 (five) minutes as needed for chest pain.   ONETOUCH DELICA LANCETS FINE MISC Use to check sugar daily   ONETOUCH VERIO test strip USE 1 STRIP TO CHECK GLUCOSE ONCE DAILY   spironolactone (ALDACTONE) 25 MG tablet Take 25 mg by mouth in the morning.   tamsulosin (FLOMAX) 0.4 MG CAPS capsule TAKE 1 CAPSULE BY MOUTH  DAILY     Allergies:   Bee venom   Social History   Socioeconomic History   Marital status: Widowed    Spouse name: Not on file   Number of children: 3   Years of education: Not on file   Highest education level: Master's degree (e.g., MA, MS, MEng, MEd, MSW, MBA)  Occupational History    Occupation: retired  Tobacco Use   Smoking status: Former    Years: 20.00    Types: Cigarettes, Pipe, Cigars    Quit date: 09/09/1968    Years since quitting: 53.8   Smokeless tobacco: Never   Tobacco comments:    smoking cigars- quit in 2006  , quit pipe 2000  Vaping Use   Vaping Use: Never used  Substance and Sexual Activity   Alcohol use: Not Currently    Alcohol/week: 2.0 standard drinks of alcohol    Types:  2 Glasses of wine per week    Comment: 2 glasses on Tuesday   Drug use: No   Sexual activity: Never  Other Topics Concern   Not on file  Social History Narrative   Regular exercise: goes to the [facility] and walks   Caffeine use: daily; coffee   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     widowed    Three children, One in [place], one in [place] one in [place] - 2 : youngest daughter lives with him   Social Determinants of Health   Financial Resource Strain: Medium Risk (05/28/2022)   Overall Financial Resource Strain (CARDIA)    Difficulty of Paying Living Expenses: Somewhat hard  Food Insecurity: No Food Insecurity (12/26/2021)   Hunger Vital Sign    Worried About Running Out of Food in the Last Year: Never true    Ran Out of Food in the Last Year: Never true  Transportation Needs: No Transportation Needs (12/26/2021)   PRAPARE - Hydrologist (Medical): No    Lack of Transportation (Non-Medical): No  Physical Activity: Sufficiently Active (12/26/2021)   Exercise Vital Sign    Days of Exercise per Week: 5 days    Minutes of Exercise per Session: 140 min  Stress: No Stress Concern Present (12/26/2021)   [place]    Feeling of Stress : Not at all  Social Connections: Moderately Integrated (12/26/2021)   Social Connection and Isolation Panel [NHANES]    Frequency of Communication with Friends and Family: More than three times a week    Frequency of Social  Gatherings with Friends and Family: More than three times a week    Attends Religious Services: More than 4 times per year    Active Member of [employer] or Organizations: Yes    Attends Archivist Meetings: More than 4 times per year    Marital Status: Widowed  Recent Concern: Social Connections - Moderately Isolated (10/11/2021)   Social Connection and Isolation Panel [NHANES]    Frequency of Communication with Friends and Family: More than three times a week    Frequency of Social Gatherings with Friends and Family: More than three times a week    Attends Religious Services: More than 4 times per year    Active Member of [employer] or Organizations: No    Attends Archivist Meetings: Not on file    Marital Status: Widowed     Family History: The patient's family history includes Cancer in an other family member; Diabetes in his mother and sister; Drug abuse in an other family member; Heart disease in an other family member; Lung disease in an other family member. There is no history of Colon cancer or Stomach cancer.  ROS:   Please see the history of present illness.    All other systems reviewed and are negative.  EKGs/Labs/Other Studies Reviewed:     Exercise stress test: 1.o mets, max HR 90 bpm no ECG changes  EKG:  Last EKG results: atrial flutter with V-rate 73 bpm, RBBB   Recent Labs: 10/09/2021: ALT 25; BUN 18; Creatinine, Ser 1.22; Hemoglobin 14.4; Platelets 219.0; Potassium 4.9; Sodium 137 05/08/2022: TSH 9.93    Risk Assessment/Calculations:    CHA2DS2-VASc Score = 5   This indicates a 7.2% annual risk of stroke. The patient's score is based upon: CHF History: 0 HTN History: 1 Diabetes History: 1 Stroke History:  0 Vascular Disease History: 1 Age Score: 2 Gender Score: 0          Physical Exam:    VS:  BP 132/82   Pulse 73   Ht '5\' 11"'$  (1.803 m)   Wt 256 lb 12.8 oz (116.5 kg)   SpO2 97%   BMI 35.82 kg/m     Wt Readings from Last 3  Encounters:  06/24/22 256 lb 12.8 oz (116.5 kg)  05/14/22 252 lb (114.3 kg)  05/08/22 254 lb (115.2 kg)     GEN:  Well nourished, well developed in no acute distress CARDIAC: RRR, no murmurs, rubs, gallops RESPIRATORY:  Normal work of breathing MUSCULOSKELETAL: trace edema    ASSESSMENT & PLAN:    Atrial fibrillation and typical-appearing atrial flutter: the only ECG documentation of AF I have is from the time of an NSTEMI in 2017. I suspect amio was started for recurrence of AF, and he has since organized into flutter. He would really like to DC amiodarone, which he has failed at this point. We discussed alternatives - rate control, an alternative medication (precluded by QT prolongation on amiodarone at present), or ablation. We discussed the indication, rationale, logistics, anticipated benefits, and potential risks of the ablation procedure including but not limited to -- bleed at the groin access site, chest pain, damage to nearby organs such as the diaphragm, lungs, or esophagus, need for a drainage tube, or prolonged hospitalization. I explained that the risk for stroke, heart attack, need for open chest surgery, or even death is very low but not zero. He expressed understanding and wishes to proceed.  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 Continue eliquis '5mg'$  PO BID Hypothyroid: monitor after discontinuation of amiodarone High risk medication: Will DC 5 days prior to ablation           Medication Adjustments/Labs and Tests Ordered: Current medicines are reviewed at length with the patient today.  Concerns regarding medicines are outlined above.  Orders Placed This Encounter  Procedures   CT CARDIAC MORPH/PULM VEIN W/CM&W/O CA SCORE   Basic metabolic panel   CBC with Differential/Platelet   EKG 12-Lead   No orders of the defined types were placed in this encounter.    Signed, Charles Quitter, MD  06/24/2022 12:22 PM    [place]

## 2022-06-23 NOTE — Progress Notes (Unsigned)
Cardiology Office Note:    Date:  06/24/2022   ID:  Charles [name], DOB 1940/09/21, MRN [id number]  PCP:  [name], NP   [facility] Providers Cardiologist:  None Electrophysiologist:  Melida Quitter, MD     Referring MD: [name], NP   Chief complaint: fatigue  History of Present Illness:    Charles [name] is a 81 y.o. male with a hx of coronary disease status post myocardial infarction 10/2015, history of adenomatous polyps type 2 diabetes, hypertension, for for arrhythmia management.  Atrial fibrillation is documented on an ECG from 10/20/2015. This occurred at the time of an admission for NSTEMI. I do not have records from that admission otherwise.  The patient recalls that he had an episode of atrial fibrillation about a year ago.  At that time, he was very fatigued and noticed that he was not able to walk from the parking lot into the [facility] where he exercises regularly.  He was started on amiodarone by his cardiologist, Dr. Terrence Dupont sometime around March 2023.  Per the patient's recollection, this is because he had recurrence of atrial fibrillation.    When he saw his endocrinologist in August, he expressed a concern that amiodarone could affect his thyroid levels.  His T4 and T3 returned within normal limits, but the TSH was significantly elevated at 9.93. He was advised to discuss amiodarone discontinuation with cardiology.   He was seen by Dr. Terrence Dupont, and according to the patient, was noted to be in atrial flutter at that time.  He was subsequently referred to EP.  He does not have significant palpitations, but he does feel short of breath and fatigue.  He is not able to exercise to the extent he was previously.   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[place]) 2000   left nephrectomy   Coronary  artery disease    Dysrhythmia    PAF   Eczema    GERD (gastroesophageal reflux disease)    Hernia, ventral    History of blood transfusion    Hyperlipidemia    Hypertension    Myocardial infarction ([place])    Neuropathy    Obesity    Personal history of adenomatous colonic polyps 07/20/2012   3 + adenomas 2009 07/20/2012 2 diminutive polyps     Pneumonia 1942; 1950s X 1   Type II diabetes mellitus ([place])    type II   Walking pneumonia 2000's X 1    Past Surgical History:  Procedure Laterality Date   CARDIAC CATHETERIZATION N/A 10/19/2015   Procedure: Left Heart Cath and Coronary Angiography;  Surgeon: Charolette Forward, MD;  Location: [facility];  Service: Cardiovascular;  Laterality: N/A;   CARDIAC CATHETERIZATION N/A 10/19/2015   Procedure: Coronary Stent Intervention;  Surgeon: Charolette Forward, MD;  Location: [facility];  Service: Cardiovascular;  Laterality: N/A;   CATARACT EXTRACTION, BILATERAL Bilateral 01/2018   Dr. Kathrin Penner did surgery and he developed MD and is seeing specialist now for MD treatment   COLONOSCOPY     "nothing showed up this time"   COLONOSCOPY W/ BIOPSIES AND POLYPECTOMY  X 1   CORONARY ANGIOPLASTY     EXCISION MASS NECK Left 01/08/2021   Procedure: EXCISION MASS NECK;  Surgeon: Melida Quitter, MD;  Location: [facility];  Service: ENT;  Laterality: Left;   FEMUR IM NAIL Right 07/26/2013   Procedure: INTRAMEDULLARY (IM) NAIL FEMORAL subtrochanteric;  Surgeon: Mauri Pole, MD;  Location: [facility];  Service: Orthopedics;  Laterality: Right;   FRACTURE SURGERY     INGUINAL HERNIA REPAIR Left >3 times   INGUINAL HERNIA REPAIR Right 2012   NEPHRECTOMY Left 2000   TONSILLECTOMY  1940s    Current Medications: Current Meds  Medication Sig   amiodarone (PACERONE) 200 MG tablet Take 200 mg by mouth daily.   aspirin 81 MG chewable tablet Chew 81 mg by mouth in the morning.   atorvastatin (LIPITOR) 40 MG tablet Take 1 tablet (40 mg total) by mouth daily at 6  PM.   Cholecalciferol (VITAMIN D-3 PO) Take 5,000 Units by mouth in the morning.   Cyanocobalamin (VITAMIN B-12) 5000 MCG SUBL Place 5,000 mcg under the tongue every 3 (three) days.   ELIQUIS 5 MG TABS tablet Take 5 mg by mouth 2 (two) times daily.   EPINEPHrine (EPI-PEN) 0.3 mg/0.3 mL DEVI Inject 0.3 mg into the muscle daily as needed (allergic reaction).   gabapentin (NEURONTIN) 100 MG capsule TAKE 1 CAPSULE BY MOUTH AT  BEDTIME   glipiZIDE (GLUCOTROL) 5 MG tablet TAKE 1 TABLET BY MOUTH  TWICE DAILY BEFORE A MEAL (Patient taking differently: 2.5 mg.)   Insulin Pen Needle (PEN NEEDLES) 32G X 4 MM MISC Use as instructed to administer weekly injection.   Insulin Syringes, Disposable, U-100 0.5 ML MISC Use 1x a day   levothyroxine (SYNTHROID) 25 MCG tablet Take 25 mcg by mouth daily.   metFORMIN (GLUCOPHAGE) 1000 MG tablet Take 1 tablet (1,000 mg total) by mouth 2 (two) times daily with a meal.   metoprolol succinate (TOPROL-XL) 100 MG 24 hr tablet Take 50-100 mg by mouth See admin instructions. Take 1 tablet (100 mg) gy mouth in the morning & take 0.5 tablet (50 mg) by mouth in the evening.   Multiple Vitamins-Minerals (PRESERVISION AREDS 2 PO) Take 1 tablet by mouth in the morning and at bedtime.   nitroGLYCERIN (NITROSTAT) 0.4 MG SL tablet Place 1 tablet (0.4 mg total) under the tongue every 5 (five) minutes as needed for chest pain.   ONETOUCH DELICA LANCETS FINE MISC Use to check sugar daily   ONETOUCH VERIO test strip USE 1 STRIP TO CHECK GLUCOSE ONCE DAILY   spironolactone (ALDACTONE) 25 MG tablet Take 25 mg by mouth in the morning.   tamsulosin (FLOMAX) 0.4 MG CAPS capsule TAKE 1 CAPSULE BY MOUTH  DAILY     Allergies:   Bee venom   Social History   Socioeconomic History   Marital status: Widowed    Spouse name: Not on file   Number of children: 3   Years of education: Not on file   Highest education level: Master's degree (e.g., MA, MS, MEng, MEd, MSW, MBA)  Occupational History    Occupation: retired  Tobacco Use   Smoking status: Former    Years: 20.00    Types: Cigarettes, Pipe, Cigars    Quit date: 09/09/1968    Years since quitting: 53.8   Smokeless tobacco: Never   Tobacco comments:    smoking cigars- quit in 2006  , quit pipe 2000  Vaping Use   Vaping Use: Never used  Substance and Sexual Activity   Alcohol use: Not Currently    Alcohol/week: 2.0 standard drinks of alcohol    Types:  2 Glasses of wine per week    Comment: 2 glasses on Tuesday   Drug use: No   Sexual activity: Never  Other Topics Concern   Not on file  Social History Narrative   Regular exercise: goes to the [facility] and walks   Caffeine use: daily; coffee   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     widowed    Three children, One in [place], one in [place] one in [place] - 2 : youngest daughter lives with him   Social Determinants of Health   Financial Resource Strain: Medium Risk (05/28/2022)   Overall Financial Resource Strain (CARDIA)    Difficulty of Paying Living Expenses: Somewhat hard  Food Insecurity: No Food Insecurity (12/26/2021)   Hunger Vital Sign    Worried About Running Out of Food in the Last Year: Never true    Ran Out of Food in the Last Year: Never true  Transportation Needs: No Transportation Needs (12/26/2021)   PRAPARE - Hydrologist (Medical): No    Lack of Transportation (Non-Medical): No  Physical Activity: Sufficiently Active (12/26/2021)   Exercise Vital Sign    Days of Exercise per Week: 5 days    Minutes of Exercise per Session: 140 min  Stress: No Stress Concern Present (12/26/2021)   [place]    Feeling of Stress : Not at all  Social Connections: Moderately Integrated (12/26/2021)   Social Connection and Isolation Panel [NHANES]    Frequency of Communication with Friends and Family: More than three times a week    Frequency of Social  Gatherings with Friends and Family: More than three times a week    Attends Religious Services: More than 4 times per year    Active Member of [employer] or Organizations: Yes    Attends Archivist Meetings: More than 4 times per year    Marital Status: Widowed  Recent Concern: Social Connections - Moderately Isolated (10/11/2021)   Social Connection and Isolation Panel [NHANES]    Frequency of Communication with Friends and Family: More than three times a week    Frequency of Social Gatherings with Friends and Family: More than three times a week    Attends Religious Services: More than 4 times per year    Active Member of [employer] or Organizations: No    Attends Archivist Meetings: Not on file    Marital Status: Widowed     Family History: The patient's family history includes Cancer in an other family member; Diabetes in his mother and sister; Drug abuse in an other family member; Heart disease in an other family member; Lung disease in an other family member. There is no history of Colon cancer or Stomach cancer.  ROS:   Please see the history of present illness.    All other systems reviewed and are negative.  EKGs/Labs/Other Studies Reviewed:     Exercise stress test: 1.o mets, max HR 90 bpm no ECG changes  EKG:  Last EKG results: atrial flutter with V-rate 73 bpm, RBBB   Recent Labs: 10/09/2021: ALT 25; BUN 18; Creatinine, Ser 1.22; Hemoglobin 14.4; Platelets 219.0; Potassium 4.9; Sodium 137 05/08/2022: TSH 9.93    Risk Assessment/Calculations:    CHA2DS2-VASc Score = 5   This indicates a 7.2% annual risk of stroke. The patient's score is based upon: CHF History: 0 HTN History: 1 Diabetes History: 1 Stroke History:  0 Vascular Disease History: 1 Age Score: 2 Gender Score: 0          Physical Exam:    VS:  BP 132/82   Pulse 73   Ht '5\' 11"'$  (1.803 m)   Wt 256 lb 12.8 oz (116.5 kg)   SpO2 97%   BMI 35.82 kg/m     Wt Readings from Last 3  Encounters:  06/24/22 256 lb 12.8 oz (116.5 kg)  05/14/22 252 lb (114.3 kg)  05/08/22 254 lb (115.2 kg)     GEN:  Well nourished, well developed in no acute distress CARDIAC: RRR, no murmurs, rubs, gallops RESPIRATORY:  Normal work of breathing MUSCULOSKELETAL: trace edema    ASSESSMENT & PLAN:    Atrial fibrillation and typical-appearing atrial flutter: the only ECG documentation of AF I have is from the time of an NSTEMI in 2017. I suspect amio was started for recurrence of AF, and he has since organized into flutter. He would really like to DC amiodarone, which he has failed at this point. We discussed alternatives - rate control, an alternative medication (precluded by QT prolongation on amiodarone at present), or ablation. We discussed the indication, rationale, logistics, anticipated benefits, and potential risks of the ablation procedure including but not limited to -- bleed at the groin access site, chest pain, damage to nearby organs such as the diaphragm, lungs, or esophagus, need for a drainage tube, or prolonged hospitalization. I explained that the risk for stroke, heart attack, need for open chest surgery, or even death is very low but not zero. He expressed understanding and wishes to proceed.  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 Continue eliquis '5mg'$  PO BID Hypothyroid: monitor after discontinuation of amiodarone High risk medication: Will DC 5 days prior to ablation           Medication Adjustments/Labs and Tests Ordered: Current medicines are reviewed at length with the patient today.  Concerns regarding medicines are outlined above.  Orders Placed This Encounter  Procedures   CT CARDIAC MORPH/PULM VEIN W/CM&W/O CA SCORE   Basic metabolic panel   CBC with Differential/Platelet   EKG 12-Lead   No orders of the defined types were placed in this encounter.    Signed, Melida Quitter, MD  06/24/2022 12:22 PM    [place]

## 2022-06-24 ENCOUNTER — Encounter: Payer: Self-pay | Admitting: Cardiovascular Disease

## 2022-06-24 ENCOUNTER — Ambulatory Visit: Payer: Medicare Other | Attending: Cardiovascular Disease | Admitting: Cardiovascular Disease

## 2022-06-24 VITALS — BP 132/82 | HR 73 | Ht 71.0 in | Wt 256.8 lb

## 2022-06-24 DIAGNOSIS — I4891 Unspecified atrial fibrillation: Secondary | ICD-10-CM | POA: Diagnosis not present

## 2022-06-24 NOTE — Patient Instructions (Addendum)
Medication Instructions:  Your physician recommends that you continue on your current medications as directed. Please refer to the Current Medication list given to you today.  *If you need a refill on your cardiac medications before your next appointment, please call your pharmacy*  Testing/Procedures: Your physician has requested that you have cardiac CT. Cardiac computed tomography (CT) is a painless test that uses an x-ray machine to take clear, detailed pictures of your heart. For further information please visit www.cardiosmart.org. Please follow instruction sheet as given.  Your physician has recommended that you have an ablation. Catheter ablation is a medical procedure used to treat some cardiac arrhythmias (irregular heartbeats). During catheter ablation, a long, thin, flexible tube is put into a blood vessel in your groin (upper thigh), or neck. This tube is called an ablation catheter. It is then guided to your heart through the blood vessel. Radio frequency waves destroy small areas of heart tissue where abnormal heartbeats may cause an arrhythmia to start. Please see the instruction sheet given to you today.  Follow-Up: At [facility], you and your health needs are our priority.  As part of our continuing mission to provide you with exceptional heart care, we have created designated Provider Care Teams.  These Care Teams include your primary Cardiologist (physician) and Advanced Practice Providers (APPs -  Physician Assistants and Nurse Practitioners) who all work together to provide you with the care you need, when you need it.  Your next appointment:   See instruction letter  Important Information About Sugar       

## 2022-06-25 ENCOUNTER — Telehealth: Payer: Self-pay | Admitting: Cardiology

## 2022-06-25 ENCOUNTER — Telehealth: Payer: Self-pay | Admitting: Cardiovascular Disease

## 2022-06-25 DIAGNOSIS — E875 Hyperkalemia: Secondary | ICD-10-CM

## 2022-06-25 LAB — CBC WITH DIFFERENTIAL/PLATELET
Basophils Absolute: 0.1 10*3/uL (ref 0.0–0.2)
Basos: 1 %
EOS (ABSOLUTE): 0.4 10*3/uL (ref 0.0–0.4)
Eos: 6 %
Hematocrit: 43.1 % (ref 37.5–51.0)
Hemoglobin: 14.3 g/dL (ref 13.0–17.7)
Immature Grans (Abs): 0 10*3/uL (ref 0.0–0.1)
Immature Granulocytes: 0 %
Lymphocytes Absolute: 1.2 10*3/uL (ref 0.7–3.1)
Lymphs: 20 %
MCH: 32.2 pg (ref 26.6–33.0)
MCHC: 33.2 g/dL (ref 31.5–35.7)
MCV: 97 fL (ref 79–97)
Monocytes Absolute: 0.7 10*3/uL (ref 0.1–0.9)
Monocytes: 12 %
Neutrophils Absolute: 3.6 10*3/uL (ref 1.4–7.0)
Neutrophils: 61 %
Platelets: 197 10*3/uL (ref 150–450)
RBC: 4.44 x10E6/uL (ref 4.14–5.80)
RDW: 12.9 % (ref 11.6–15.4)
WBC: 5.8 10*3/uL (ref 3.4–10.8)

## 2022-06-25 LAB — BASIC METABOLIC PANEL
BUN/Creatinine Ratio: 13 (ref 10–24)
BUN: 17 mg/dL (ref 8–27)
CO2: 26 mmol/L (ref 20–29)
Calcium: 9.2 mg/dL (ref 8.6–10.2)
Chloride: 102 mmol/L (ref 96–106)
Creatinine, Ser: 1.26 mg/dL (ref 0.76–1.27)
Glucose: 146 mg/dL — ABNORMAL HIGH (ref 70–99)
Potassium: 5.9 mmol/L (ref 3.5–5.2)
Sodium: 140 mmol/L (ref 134–144)
eGFR: 57 mL/min/{1.73_m2} — ABNORMAL LOW (ref >59)

## 2022-06-25 NOTE — Telephone Encounter (Signed)
Advised patient to hold his Spironolactone this morning. Repeat labs on Friday.   Patient is requesting to have a TSH level, last drawn Aug 30 level 9.93 by another provider. He is concerned it has continued to be abnormal or get worse with his use of Amiodarone.   Will forward request to Dr. Myles Gip for advisement.

## 2022-06-25 NOTE — Telephone Encounter (Signed)
Pt returning nurses call regarding lab results. Please advise 

## 2022-06-25 NOTE — Telephone Encounter (Signed)
See previous phone note.  

## 2022-06-25 NOTE — Telephone Encounter (Signed)
Spoke with the patient and advised him on recommendations per Dr. Myles Gip. Patient verbalized understanding. Patient is to discontinue his amiodarone 5 days prior to his ablation.

## 2022-06-25 NOTE — Telephone Encounter (Signed)
Received outpatient lab cal regarding K+ 5.9 from lab draw (10/16). Unable to reach patient regarding holding spiro this morning regarding recommendation of holding spiro and need for repeat labs. Will route to ordering MD and triage to follow up with patient regarding plan.

## 2022-06-28 ENCOUNTER — Ambulatory Visit: Payer: Medicare Other | Attending: Cardiovascular Disease

## 2022-06-28 DIAGNOSIS — E875 Hyperkalemia: Secondary | ICD-10-CM

## 2022-06-28 LAB — BASIC METABOLIC PANEL
BUN/Creatinine Ratio: 17 (ref 10–24)
BUN: 23 mg/dL (ref 8–27)
CO2: 25 mmol/L (ref 20–29)
Calcium: 9.1 mg/dL (ref 8.6–10.2)
Chloride: 102 mmol/L (ref 96–106)
Creatinine, Ser: 1.37 mg/dL — ABNORMAL HIGH (ref 0.76–1.27)
Glucose: 190 mg/dL — ABNORMAL HIGH (ref 70–99)
Potassium: 4.9 mmol/L (ref 3.5–5.2)
Sodium: 141 mmol/L (ref 134–144)
eGFR: 52 mL/min/{1.73_m2} — ABNORMAL LOW (ref >59)

## 2022-07-04 ENCOUNTER — Ambulatory Visit (HOSPITAL_BASED_OUTPATIENT_CLINIC_OR_DEPARTMENT_OTHER): Payer: Medicare Other

## 2022-07-05 ENCOUNTER — Other Ambulatory Visit (HOSPITAL_BASED_OUTPATIENT_CLINIC_OR_DEPARTMENT_OTHER): Payer: Medicare Other

## 2022-07-08 ENCOUNTER — Telehealth (HOSPITAL_COMMUNITY): Payer: Self-pay | Admitting: *Deleted

## 2022-07-08 NOTE — Telephone Encounter (Signed)
Attempted to call patient regarding upcoming cardiac CT appointment. °Left message on voicemail with name and callback number ° °Lilianah Buffin RN Navigator Cardiac Imaging °[facility] °[phone] Office °[phone] Cell ° °

## 2022-07-08 NOTE — Telephone Encounter (Signed)
Patient returning call regarding upcoming cardiac imaging study; pt verbalizes understanding of appt date/time, parking situation and where to check in, and verified current allergies; name and call back number provided for further questions should they arise  Charles Clement RN Navigator Cardiac [facility] [phone] office [phone] cell  Patient aware to arrive at 1pm.

## 2022-07-09 ENCOUNTER — Telehealth: Payer: Self-pay

## 2022-07-09 ENCOUNTER — Ambulatory Visit (HOSPITAL_COMMUNITY)
Admission: RE | Admit: 2022-07-09 | Discharge: 2022-07-09 | Disposition: A | Discharge status: Home / Self Care | Payer: Medicare Other | Source: Ambulatory Visit | Attending: Cardiovascular Disease | Admitting: Cardiovascular Disease

## 2022-07-09 ENCOUNTER — Encounter (HOSPITAL_COMMUNITY): Payer: Self-pay

## 2022-07-09 DIAGNOSIS — I4891 Unspecified atrial fibrillation: Secondary | ICD-10-CM | POA: Diagnosis not present

## 2022-07-09 DIAGNOSIS — E1165 Type 2 diabetes mellitus with hyperglycemia: Secondary | ICD-10-CM

## 2022-07-09 MED ORDER — IOHEXOL 350 MG/ML SOLN
100.0000 mL | Freq: Once | INTRAVENOUS | Status: AC | PRN
  Administered 2022-07-09: 95 mL via INTRAVENOUS

## 2022-07-09 NOTE — Telephone Encounter (Signed)
From [facility]: Called pt to see if interested in having labs for KED gap.  He would need urine albumin creatinine ratio test ordered or I can send out home kit.  LVM for pt to call me back.  Sent note in case he calls office  Pt notified of above & lab appt & CPE scheduled.

## 2022-07-11 NOTE — Pre-Procedure Instructions (Signed)
Instructed patient on the following items: Arrival time 0530 Nothing to eat or drink after midnight No meds AM of procedure Responsible person to drive you home and stay with you for 24 hrs  Have you missed any doses of anti-coagulant Eliquis- if you have missed any doses please let the office know

## 2022-07-12 ENCOUNTER — Encounter (HOSPITAL_COMMUNITY)
Admission: RE | Disposition: A | Discharge status: Home / Self Care | Payer: Self-pay | Source: Ambulatory Visit | Attending: Cardiovascular Disease

## 2022-07-12 ENCOUNTER — Ambulatory Visit (HOSPITAL_COMMUNITY): Payer: Medicare Other | Admitting: Certified Registered Nurse Anesthetist

## 2022-07-12 ENCOUNTER — Ambulatory Visit (HOSPITAL_COMMUNITY)
Admission: RE | Admit: 2022-07-12 | Discharge: 2022-07-12 | Disposition: A | Discharge status: Home / Self Care | Payer: Medicare Other | Source: Ambulatory Visit | Attending: Cardiovascular Disease | Admitting: Cardiovascular Disease

## 2022-07-12 ENCOUNTER — Ambulatory Visit (HOSPITAL_BASED_OUTPATIENT_CLINIC_OR_DEPARTMENT_OTHER): Payer: Medicare Other | Admitting: Certified Registered Nurse Anesthetist

## 2022-07-12 DIAGNOSIS — D6859 Other primary thrombophilia: Secondary | ICD-10-CM | POA: Insufficient documentation

## 2022-07-12 DIAGNOSIS — I252 Old myocardial infarction: Secondary | ICD-10-CM | POA: Insufficient documentation

## 2022-07-12 DIAGNOSIS — I483 Typical atrial flutter: Secondary | ICD-10-CM | POA: Diagnosis not present

## 2022-07-12 DIAGNOSIS — I10 Essential (primary) hypertension: Secondary | ICD-10-CM | POA: Insufficient documentation

## 2022-07-12 DIAGNOSIS — Z79899 Other long term (current) drug therapy: Secondary | ICD-10-CM | POA: Insufficient documentation

## 2022-07-12 DIAGNOSIS — Z87891 Personal history of nicotine dependence: Secondary | ICD-10-CM | POA: Insufficient documentation

## 2022-07-12 DIAGNOSIS — E039 Hypothyroidism, unspecified: Secondary | ICD-10-CM | POA: Insufficient documentation

## 2022-07-12 DIAGNOSIS — I2510 Atherosclerotic heart disease of native coronary artery without angina pectoris: Secondary | ICD-10-CM | POA: Diagnosis not present

## 2022-07-12 DIAGNOSIS — Z7901 Long term (current) use of anticoagulants: Secondary | ICD-10-CM | POA: Insufficient documentation

## 2022-07-12 DIAGNOSIS — I4891 Unspecified atrial fibrillation: Secondary | ICD-10-CM

## 2022-07-12 DIAGNOSIS — E119 Type 2 diabetes mellitus without complications: Secondary | ICD-10-CM | POA: Diagnosis not present

## 2022-07-12 HISTORY — PX: ATRIAL FIBRILLATION ABLATION: EP1191

## 2022-07-12 HISTORY — PX: A-FLUTTER ABLATION: EP1230

## 2022-07-12 LAB — GLUCOSE, CAPILLARY
Glucose-Capillary: 148 mg/dL — ABNORMAL HIGH (ref 70–99)
Glucose-Capillary: 159 mg/dL — ABNORMAL HIGH (ref 70–99)

## 2022-07-12 SURGERY — ATRIAL FIBRILLATION ABLATION
Anesthesia: General

## 2022-07-12 MED ORDER — HEPARIN (PORCINE) IN NACL 1000-0.9 UT/500ML-% IV SOLN
INTRAVENOUS | Status: AC
  Filled 2022-07-12: qty 500

## 2022-07-12 MED ORDER — FENTANYL CITRATE (PF) 250 MCG/5ML IJ SOLN
INTRAMUSCULAR | Status: DC | PRN
  Administered 2022-07-12: 100 ug via INTRAVENOUS

## 2022-07-12 MED ORDER — LIDOCAINE 2% (20 MG/ML) 5 ML SYRINGE
INTRAMUSCULAR | Status: DC | PRN
  Administered 2022-07-12: 60 mg via INTRAVENOUS

## 2022-07-12 MED ORDER — DOBUTAMINE INFUSION FOR EP/ECHO/NUC (1000 MCG/ML)
INTRAVENOUS | Status: DC | PRN
  Administered 2022-07-12: 20 ug/kg/min via INTRAVENOUS

## 2022-07-12 MED ORDER — PROTAMINE SULFATE 10 MG/ML IV SOLN
INTRAVENOUS | Status: DC | PRN
  Administered 2022-07-12: 40 mg via INTRAVENOUS
  Administered 2022-07-12: 10 mg via INTRAVENOUS

## 2022-07-12 MED ORDER — SODIUM CHLORIDE 0.9 % IV SOLN: INTRAVENOUS | Status: DC

## 2022-07-12 MED ORDER — ACETAMINOPHEN 325 MG PO TABS: 650.0000 mg | ORAL_TABLET | ORAL | Status: DC | PRN

## 2022-07-12 MED ORDER — SODIUM CHLORIDE 0.9% FLUSH: 3.0000 mL | INTRAVENOUS | Status: DC | PRN

## 2022-07-12 MED ORDER — HEPARIN SODIUM (PORCINE) 1000 UNIT/ML IJ SOLN
INTRAMUSCULAR | Status: DC | PRN
  Administered 2022-07-12: 16000 [IU] via INTRAVENOUS
  Administered 2022-07-12 (×2): 2000 [IU] via INTRAVENOUS

## 2022-07-12 MED ORDER — ONDANSETRON HCL 4 MG/2ML IJ SOLN
INTRAMUSCULAR | Status: DC | PRN
  Administered 2022-07-12: 4 mg via INTRAVENOUS

## 2022-07-12 MED ORDER — SODIUM CHLORIDE 0.9% FLUSH: 3.0000 mL | Freq: Two times a day (BID) | INTRAVENOUS | Status: DC

## 2022-07-12 MED ORDER — SODIUM CHLORIDE 0.9 % IV SOLN: 250.0000 mL | INTRAVENOUS | Status: DC | PRN

## 2022-07-12 MED ORDER — DOBUTAMINE INFUSION FOR EP/ECHO/NUC (1000 MCG/ML)
INTRAVENOUS | Status: AC
  Filled 2022-07-12: qty 250

## 2022-07-12 MED ORDER — SUGAMMADEX SODIUM 200 MG/2ML IV SOLN
INTRAVENOUS | Status: DC | PRN
  Administered 2022-07-12: 100 mg via INTRAVENOUS
  Administered 2022-07-12: 200 mg via INTRAVENOUS

## 2022-07-12 MED ORDER — ROCURONIUM BROMIDE 10 MG/ML (PF) SYRINGE
PREFILLED_SYRINGE | INTRAVENOUS | Status: DC | PRN
  Administered 2022-07-12: 20 mg via INTRAVENOUS
  Administered 2022-07-12: 60 mg via INTRAVENOUS

## 2022-07-12 MED ORDER — PHENYLEPHRINE HCL-NACL 20-0.9 MG/250ML-% IV SOLN
INTRAVENOUS | Status: DC | PRN
  Administered 2022-07-12: 25 ug/min via INTRAVENOUS

## 2022-07-12 MED ORDER — ONDANSETRON HCL 4 MG/2ML IJ SOLN: 4.0000 mg | Freq: Four times a day (QID) | INTRAMUSCULAR | Status: DC | PRN

## 2022-07-12 MED ORDER — PHENYLEPHRINE HCL (PRESSORS) 10 MG/ML IV SOLN
INTRAVENOUS | Status: DC | PRN
  Administered 2022-07-12 (×5): 160 ug via INTRAVENOUS

## 2022-07-12 MED ORDER — HEPARIN SODIUM (PORCINE) 1000 UNIT/ML IJ SOLN
INTRAMUSCULAR | Status: DC | PRN
  Administered 2022-07-12: 1000 [IU] via INTRAVENOUS

## 2022-07-12 MED ORDER — PROPOFOL 10 MG/ML IV BOLUS
INTRAVENOUS | Status: DC | PRN
  Administered 2022-07-12: 50 mg via INTRAVENOUS
  Administered 2022-07-12: 150 mg via INTRAVENOUS

## 2022-07-12 MED ORDER — EPHEDRINE SULFATE-NACL 50-0.9 MG/10ML-% IV SOSY
PREFILLED_SYRINGE | INTRAVENOUS | Status: DC | PRN
  Administered 2022-07-12: 10 mg via INTRAVENOUS
  Administered 2022-07-12 (×2): 5 mg via INTRAVENOUS

## 2022-07-12 MED ORDER — HEPARIN SODIUM (PORCINE) 1000 UNIT/ML IJ SOLN
INTRAMUSCULAR | Status: AC
  Filled 2022-07-12: qty 10

## 2022-07-12 MED ORDER — HEPARIN (PORCINE) IN NACL 1000-0.9 UT/500ML-% IV SOLN
INTRAVENOUS | Status: DC | PRN
  Administered 2022-07-12 (×4): 500 mL

## 2022-07-12 SURGICAL SUPPLY — 20 items
BLANKET WARM UNDERBOD FULL ACC (MISCELLANEOUS) ×1 IMPLANT
CATH 8FR REPROCESSED SOUNDSTAR (CATHETERS) ×1 IMPLANT
CATH 8FR SOUNDSTAR REPROCESSED (CATHETERS) IMPLANT
CATH ABLAT QDOT MICRO BI TC DF (CATHETERS) IMPLANT
CATH OCTARAY 2.0 F 3-3-3-3-3 (CATHETERS) IMPLANT
CATH PIGTAIL STEERABLE D1 8.7 (WIRE) IMPLANT
CATH S-M CIRCA TEMP PROBE (CATHETERS) IMPLANT
CATH WEB BI DIR CSDF CRV REPRO (CATHETERS) IMPLANT
CLOSURE PERCLOSE PROSTYLE (VASCULAR PRODUCTS) IMPLANT
COVER SWIFTLINK CONNECTOR (BAG) ×1 IMPLANT
DEVICE CLOSURE MYNXGRIP 6/7F (Vascular Products) IMPLANT
PACK EP LATEX FREE (CUSTOM PROCEDURE TRAY) ×1
PACK EP LF (CUSTOM PROCEDURE TRAY) ×1 IMPLANT
PAD DEFIB RADIO PHYSIO CONN (PAD) ×1 IMPLANT
PATCH CARTO3 (PAD) IMPLANT
SHEATH CARTO VIZIGO SM CVD (SHEATH) IMPLANT
SHEATH PINNACLE 8F 10CM (SHEATH) IMPLANT
SHEATH PINNACLE 9F 10CM (SHEATH) IMPLANT
SHEATH PROBE COVER 6X72 (BAG) IMPLANT
TUBING SMART ABLATE COOLFLOW (TUBING) IMPLANT

## 2022-07-12 NOTE — Discharge Instructions (Signed)

## 2022-07-12 NOTE — Interval H&P Note (Signed)
History and Physical Interval Note:  07/12/2022 7:24 AM  Charles [name]  has presented today for surgery, with the diagnosis of afib, aflutter.  The various methods of treatment have been discussed with the patient and family. After consideration of risks, benefits and other options for treatment, the patient has consented to  Procedure(s): ATRIAL FIBRILLATION ABLATION (N/A) A-FLUTTER ABLATION (N/A) as a surgical intervention.  The patient's history has been reviewed, patient examined, no change in status, stable for surgery.  I have reviewed the patient's chart and labs.  Questions were answered to the patient's satisfaction.    He missed one dose of eliquis prior to his CT, which showed no thrombus, and he has remained on the eliquis since.   [name]

## 2022-07-12 NOTE — Anesthesia Preprocedure Evaluation (Signed)
Anesthesia Evaluation  Patient identified by MRN, date of birth, ID band Patient awake    Reviewed: Allergy & Precautions, H&P , NPO status , Patient's Chart, lab work & pertinent test results  Airway Mallampati: II   Neck ROM: full    Dental   Pulmonary former smoker   breath sounds clear to auscultation       Cardiovascular hypertension, + CAD, + Past MI and + Cardiac Stents  + dysrhythmias Atrial Fibrillation  Rhythm:regular Rate:Normal  2017 h/o MI with stent placement. Stress test (2018): no inducible ischemia. EF 50%   Neuro/Psych    GI/Hepatic ,GERD  ,,  Endo/Other  diabetes, Type 2    Renal/GU Renal diseaseH/o nephrectomy for renal CA     Musculoskeletal  (+) Arthritis ,    Abdominal   Peds  Hematology   Anesthesia Other Findings   Reproductive/Obstetrics                             Anesthesia Physical Anesthesia Plan  ASA: 3  Anesthesia Plan: General   Post-op Pain Management:    Induction: Intravenous  PONV Risk Score and Plan: 2 and Ondansetron, Dexamethasone and Treatment may vary due to age or medical condition  Airway Management Planned: Oral ETT  Additional Equipment:   Intra-op Plan:   Post-operative Plan: Extubation in OR  Informed Consent: I have reviewed the patients History and Physical, chart, labs and discussed the procedure including the risks, benefits and alternatives for the proposed anesthesia with the patient or authorized representative who has indicated his/her understanding and acceptance.     Interpreter used for [employer] Discussed with: CRNA, Anesthesiologist and Surgeon  Anesthesia Plan Comments:        Anesthesia Quick Evaluation

## 2022-07-12 NOTE — Progress Notes (Signed)
Called Dr Lind Covert anesthesia informed him the pt is c/o right jaw pain 4/10. Swelling noted at right jaw below ear. Measures 6 cm x 5 cm swelling. Dr will come and assess.

## 2022-07-12 NOTE — Anesthesia Procedure Notes (Signed)
Procedure Name: Intubation Date/Time: 07/12/2022 7:43 AM  Performed by: Glynda Jaeger, CRNAPre-anesthesia Checklist: Patient identified, Patient being monitored, Timeout performed, Emergency Drugs available and Suction available Patient Re-evaluated:Patient Re-evaluated prior to induction Oxygen Delivery Method: Circle System Utilized Preoxygenation: Pre-oxygenation with 100% oxygen Induction Type: IV induction Ventilation: Mask ventilation without difficulty Laryngoscope Size: Mac and 4 Grade View: Grade II Tube type: Oral Tube size: 7.5 mm Number of attempts: 1 Airway Equipment and Method: Stylet Placement Confirmation: ETT inserted through vocal cords under direct vision, positive ETCO2 and breath sounds checked- equal and bilateral Secured at: 23 cm Tube secured with: Tape Dental Injury: Teeth and Oropharynx as per pre-operative assessment

## 2022-07-12 NOTE — Transfer of Care (Signed)
Immediate Anesthesia Transfer of Care Note  Patient: Charles [name]  Procedure(s) Performed: ATRIAL FIBRILLATION ABLATION A-FLUTTER ABLATION  Patient Location: Cath Lab  Anesthesia Type:General  Level of Consciousness: awake, alert , oriented, patient cooperative, and responds to stimulation  Airway & Oxygen Therapy: Patient Spontanous Breathing and Patient connected to nasal cannula oxygen  Post-op Assessment: Report given to RN and Post -op Vital signs reviewed and stable  Post vital signs: Reviewed and stable  Last Vitals:  Vitals Value Taken Time  BP 119/69 07/12/22 1017  Temp    Pulse 68 07/12/22 1019  Resp 19 07/12/22 1019  SpO2 93 % 07/12/22 1019  Vitals shown include unvalidated device data.  Last Pain:  Vitals:   07/12/22 0612  TempSrc: Oral  PainSc: 0-No pain         Complications: No notable events documented.

## 2022-07-15 ENCOUNTER — Telehealth: Payer: Self-pay

## 2022-07-15 ENCOUNTER — Encounter (HOSPITAL_COMMUNITY): Payer: Self-pay | Admitting: Cardiovascular Disease

## 2022-07-15 LAB — POCT ACTIVATED CLOTTING TIME
Activated Clotting Time: 293 seconds
Activated Clotting Time: 293 seconds
Activated Clotting Time: 311 seconds

## 2022-07-15 NOTE — Telephone Encounter (Signed)
--  Caller states that has a swollen gland- R side under the right ear going down the neck. Occurred right after the surgery he had on Friday Asking to make an appointment. He had a heart ablation on Friday and the gland has been swollen ever since. Is still swollen, feels some pain when touched. Has taken tylenol for pain with some relief, had a fever on Friday and Saturday- was low grade.  07/15/2022 2:44:14 PM See PCP within 24 Hours Patsey Berthold, RN, Roma Kayser  Referrals REFERRED TO PCP OFFICE  Pt has appt with Dr Sarajane Jews on 07/16/22

## 2022-07-16 ENCOUNTER — Other Ambulatory Visit: Payer: Medicare Other

## 2022-07-16 ENCOUNTER — Encounter: Payer: Self-pay | Admitting: Family Medicine

## 2022-07-16 ENCOUNTER — Ambulatory Visit (INDEPENDENT_AMBULATORY_CARE_PROVIDER_SITE_OTHER): Payer: Medicare Other | Admitting: Family Medicine

## 2022-07-16 VITALS — BP 120/84 | HR 74 | Temp 98.1°F | Wt 260.0 lb

## 2022-07-16 DIAGNOSIS — K118 Other diseases of salivary glands: Secondary | ICD-10-CM

## 2022-07-16 DIAGNOSIS — E1159 Type 2 diabetes mellitus with other circulatory complications: Secondary | ICD-10-CM | POA: Diagnosis not present

## 2022-07-16 DIAGNOSIS — E1165 Type 2 diabetes mellitus with hyperglycemia: Secondary | ICD-10-CM | POA: Diagnosis not present

## 2022-07-16 NOTE — Progress Notes (Signed)
   Subjective:    Patient ID: Charles [name], male    DOB: Oct 23, 1940, 81 y.o.   MRN: [id number]  HPI Here for the sudden appearance of a tender mass on the right cheek 4 days ago. He says he had a low grade fever for a few days before this but he had no fever or cough or ST. On 07-12-22 he underwent a successful cardiac ablation for atrial fibrillation, and as he woke up from anesthesia, the mass appeared quite suddenly. The mass has been present ever since, though it is not quite as large now as it was at first. Of note on 01-08-21 he had a similar mass removed form the left side of his face by Dr. 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    Review of Systems  Constitutional: Negative.   HENT:  Negative for congestion, dental problem, ear pain, nosebleeds, postnasal drip, sinus pressure, sore throat and trouble swallowing.        Mass on the left cheek   Eyes: Negative.        Objective:   Physical Exam Constitutional:      Appearance: Normal appearance. He is not ill-appearing.  HENT:     Head:     Comments: There is a large firm tender mass over the right cheek area    Nose: Nose normal.     Mouth/Throat:     Pharynx: Oropharynx is clear.  Cardiovascular:     Rate and Rhythm: Normal rate and regular rhythm.     Pulses: Normal pulses.     Heart sounds: Normal heart sounds.  Pulmonary:     Effort: Pulmonary effort is normal.     Breath sounds: Normal breath sounds.  Lymphadenopathy:     Cervical: No cervical adenopathy.  Neurological:     Mental Status: He is alert.           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 We will check a CBC and mumps titers today, and we will arrange for him to see Dr. Redmond Baseman asap. Alysia Penna, MD

## 2022-07-17 DIAGNOSIS — D119 Benign neoplasm of major salivary gland, unspecified: Secondary | ICD-10-CM | POA: Diagnosis not present

## 2022-07-17 DIAGNOSIS — K1121 Acute sialoadenitis: Secondary | ICD-10-CM | POA: Diagnosis not present

## 2022-07-17 LAB — CBC WITH DIFFERENTIAL/PLATELET
Basophils Absolute: 0.1 10*3/uL (ref 0.0–0.1)
Basophils Relative: 1.2 % (ref 0.0–3.0)
Eosinophils Absolute: 0.5 10*3/uL (ref 0.0–0.7)
Eosinophils Relative: 10.8 % — ABNORMAL HIGH (ref 0.0–5.0)
HCT: 38.6 % — ABNORMAL LOW (ref 39.0–52.0)
Hemoglobin: 12.6 g/dL — ABNORMAL LOW (ref 13.0–17.0)
Lymphocytes Relative: 15.4 % (ref 12.0–46.0)
Lymphs Abs: 0.8 10*3/uL (ref 0.7–4.0)
MCHC: 32.6 g/dL (ref 30.0–36.0)
MCV: 97.1 fl (ref 78.0–100.0)
Monocytes Absolute: 0.7 10*3/uL (ref 0.1–1.0)
Monocytes Relative: 13.3 % — ABNORMAL HIGH (ref 3.0–12.0)
Neutro Abs: 3 10*3/uL (ref 1.4–7.7)
Neutrophils Relative %: 59.3 % (ref 43.0–77.0)
Platelets: 199 10*3/uL (ref 150.0–400.0)
RBC: 3.98 Mil/uL — ABNORMAL LOW (ref 4.22–5.81)
RDW: 14 % (ref 11.5–15.5)
WBC: 5 10*3/uL (ref 4.0–10.5)

## 2022-07-17 LAB — MICROALBUMIN / CREATININE URINE RATIO
Creatinine,U: 57.2 mg/dL
Microalb Creat Ratio: 48.1 mg/g — ABNORMAL HIGH (ref 0.0–30.0)
Microalb, Ur: 27.5 mg/dL — ABNORMAL HIGH (ref 0.0–1.9)

## 2022-07-18 LAB — MUMPS ANTIBODY, IGM: Mumps IgM Value: <1:20 {titer}

## 2022-07-18 LAB — MUMPS ANTIBODY, IGG: Mumps IgG: 185 AU/mL

## 2022-07-19 ENCOUNTER — Other Ambulatory Visit: Payer: Medicare Other

## 2022-07-21 ENCOUNTER — Other Ambulatory Visit: Payer: Self-pay | Admitting: Adult Health

## 2022-07-21 DIAGNOSIS — N401 Enlarged prostate with lower urinary tract symptoms: Secondary | ICD-10-CM

## 2022-07-22 NOTE — Anesthesia Postprocedure Evaluation (Signed)
Anesthesia Post Note  Patient: Charles [name]  Procedure(s) Performed: ATRIAL FIBRILLATION ABLATION A-FLUTTER ABLATION     Patient location during evaluation: PACU Anesthesia Type: General Level of consciousness: awake and alert Pain management: pain level controlled Vital Signs Assessment: post-procedure vital signs reviewed and stable Respiratory status: spontaneous breathing, nonlabored ventilation, respiratory function stable and patient connected to nasal cannula oxygen Cardiovascular status: blood pressure returned to baseline and stable Postop Assessment: no apparent nausea or vomiting Anesthetic complications: no   No notable events documented.  Last Vitals:  Vitals:   07/12/22 1400 07/12/22 1416  BP: 116/73   Pulse: 75 79  Resp: (!) 21 18  Temp:    SpO2: 93% 90%    Last Pain:  Vitals:   07/12/22 1144  TempSrc:   PainSc: 4                  Mersedes Alber S

## 2022-07-24 DIAGNOSIS — K1121 Acute sialoadenitis: Secondary | ICD-10-CM | POA: Diagnosis not present

## 2022-07-25 DIAGNOSIS — H35372 Puckering of macula, left eye: Secondary | ICD-10-CM | POA: Diagnosis not present

## 2022-07-25 DIAGNOSIS — H34811 Central retinal vein occlusion, right eye, with macular edema: Secondary | ICD-10-CM | POA: Diagnosis not present

## 2022-07-31 ENCOUNTER — Ambulatory Visit (INDEPENDENT_AMBULATORY_CARE_PROVIDER_SITE_OTHER): Payer: Medicare Other | Admitting: Adult Health

## 2022-07-31 ENCOUNTER — Encounter: Payer: Self-pay | Admitting: Adult Health

## 2022-07-31 VITALS — BP 120/70 | HR 70 | Temp 97.5°F | Ht 71.0 in | Wt 255.0 lb

## 2022-07-31 DIAGNOSIS — M545 Low back pain, unspecified: Secondary | ICD-10-CM

## 2022-07-31 MED ORDER — CYCLOBENZAPRINE HCL 10 MG PO TABS: 10.0000 mg | ORAL_TABLET | Freq: Every day | ORAL | 0 refills | Status: DC

## 2022-07-31 NOTE — Progress Notes (Signed)
Subjective:    Patient ID: Charles [name], male    DOB: May 22, 1941, 81 y.o.   MRN: [id number]  HPI  81 year old male who  has a past medical history of Anemia, Arthritis, AVM (arteriovenous malformation) of colon, Cancer of kidney ([place]) (2000), Coronary artery disease, Dysrhythmia, Eczema, GERD (gastroesophageal reflux disease), Hernia, ventral, History of blood transfusion, Hyperlipidemia, Hypertension, Myocardial infarction ([place]), Neuropathy, Obesity, Personal history of adenomatous colonic polyps (07/20/2012), Pneumonia (1942; 1950s X 1), Type II diabetes mellitus ([place]), and Walking pneumonia (2000's X 1).  He presents to the office today for right lower back pain x 4 days. Pain radiates into the groin. Pain has been improved slightly from the earlier this week where e could barely walk due to the low back pain. He denies any trauma or injury. The only thing he can think of would be bending over to to clean the shower.  He does report that every once in a while he will feel like catching in his right hip.  At home he has been using a heating pad which seems to be helping.    Review of Systems See HPI   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[place]) 2000   left nephrectomy   Coronary artery disease    Dysrhythmia    PAF   Eczema    GERD (gastroesophageal reflux disease)    Hernia, ventral    History of blood transfusion    Hyperlipidemia    Hypertension    Myocardial infarction ([place])    Neuropathy    Obesity    Personal history of adenomatous colonic polyps 07/20/2012   3 + adenomas 2009 07/20/2012 2 diminutive polyps     Pneumonia 1942; 1950s X 1   Type II diabetes mellitus ([place])    type II   Walking pneumonia 2000's X 1    Social History   Socioeconomic History   Marital status: Widowed    Spouse name: Not on file    Number of children: 3   Years of education: Not on file   Highest education level: Master's degree (e.g., MA, MS, MEng, MEd, MSW, MBA)  Occupational History   Occupation: retired  Tobacco Use   Smoking status: Former    Years: 20.00    Types: Cigarettes, Pipe, Cigars    Quit date: 09/09/1968    Years since quitting: 53.9   Smokeless tobacco: Never   Tobacco comments:    smoking cigars- quit in 2006  , quit pipe 2000  Vaping Use   Vaping Use: Never used  Substance and Sexual Activity   Alcohol use: Not Currently    Alcohol/week: 2.0 standard drinks of alcohol    Types: 2 Glasses of wine per week    Comment: 2 glasses on Tuesday   Drug use: No   Sexual activity: Never  Other Topics Concern   Not on file  Social History Narrative   Regular exercise: goes to the [facility] and walks   Caffeine use: daily; coffee   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     widowed    Three children, One in [place], one in [place] one in [place] - 2 : youngest daughter lives with him   Social  Determinants of Health   Financial Resource Strain: Medium Risk (05/28/2022)   Overall Financial Resource Strain (CARDIA)    Difficulty of Paying Living Expenses: Somewhat hard  Food Insecurity: No Food Insecurity (12/26/2021)   Hunger Vital Sign    Worried About Running Out of Food in the Last Year: Never true    Ran Out of Food in the Last Year: Never true  Transportation Needs: No Transportation Needs (12/26/2021)   PRAPARE - Hydrologist (Medical): No    Lack of Transportation (Non-Medical): No  Physical Activity: Sufficiently Active (12/26/2021)   Exercise Vital Sign    Days of Exercise per Week: 5 days    Minutes of Exercise per Session: 140 min  Stress: No Stress Concern Present (12/26/2021)   [place]    Feeling of Stress : Not at all  Social Connections: Moderately Integrated (12/26/2021)    Social Connection and Isolation Panel [NHANES]    Frequency of Communication with Friends and Family: More than three times a week    Frequency of Social Gatherings with Friends and Family: More than three times a week    Attends Religious Services: More than 4 times per year    Active Member of [employer] or Organizations: Yes    Attends Archivist Meetings: More than 4 times per year    Marital Status: Widowed  Recent Concern: Social Connections - Moderately Isolated (10/11/2021)   Social Connection and Isolation Panel [NHANES]    Frequency of Communication with Friends and Family: More than three times a week    Frequency of Social Gatherings with Friends and Family: More than three times a week    Attends Religious Services: More than 4 times per year    Active Member of [employer] or Organizations: No    Attends Archivist Meetings: Not on file    Marital Status: Widowed  Intimate Partner Violence: Not At Risk (12/26/2021)   Humiliation, Afraid, Rape, and Kick questionnaire    Fear of Current or Ex-Partner: No    Emotionally Abused: No    Physically Abused: No    Sexually Abused: No    Past Surgical History:  Procedure Laterality Date   A-FLUTTER ABLATION N/A 07/12/2022   Procedure: A-FLUTTER ABLATION;  Surgeon: Mealor, Yetta Barre, MD;  Location: [place] [facility];  Service: Cardiovascular;  Laterality: N/A;   ATRIAL FIBRILLATION ABLATION N/A 07/12/2022   Procedure: ATRIAL FIBRILLATION ABLATION;  Surgeon: Melida Quitter, MD;  Location: [place] [facility];  Service: Cardiovascular;  Laterality: N/A;   CARDIAC CATHETERIZATION N/A 10/19/2015   Procedure: Left Heart Cath and Coronary Angiography;  Surgeon: Charolette Forward, MD;  Location: [place] [facility];  Service: Cardiovascular;  Laterality: N/A;   CARDIAC CATHETERIZATION N/A 10/19/2015   Procedure: Coronary Stent Intervention;  Surgeon: Charolette Forward, MD;  Location: [place] [facility];  Service: Cardiovascular;   Laterality: N/A;   CATARACT EXTRACTION, BILATERAL Bilateral 01/2018   Dr. Kathrin Penner did surgery and he developed MD and is seeing specialist now for MD treatment   COLONOSCOPY     "nothing showed up this time"   COLONOSCOPY W/ BIOPSIES AND POLYPECTOMY  X 1   CORONARY ANGIOPLASTY     EXCISION MASS NECK Left 01/08/2021   Procedure: EXCISION MASS NECK;  Surgeon: Melida Quitter, MD;  Location: [facility];  Service: ENT;  Laterality: Left;   FEMUR IM NAIL Right 07/26/2013   Procedure:  INTRAMEDULLARY (IM) NAIL FEMORAL subtrochanteric;  Surgeon: Mauri Pole, MD;  Location: [place];  Service: Orthopedics;  Laterality: Right;   FRACTURE SURGERY     INGUINAL HERNIA REPAIR Left >3 times   INGUINAL HERNIA REPAIR Right 2012   NEPHRECTOMY Left 2000   TONSILLECTOMY  1940s    Family History  Problem Relation Age of Onset   Drug abuse Other    Cancer Other    Heart disease Other    Lung disease Other    Diabetes Mother    Diabetes Sister    Colon cancer Neg Hx    Stomach cancer Neg Hx     Allergies  Allergen Reactions   Bee Venom Anaphylaxis    Current Outpatient Medications on File Prior to Visit  Medication Sig Dispense Refill   aspirin 81 MG chewable tablet Chew 81 mg by mouth in the morning.     atorvastatin (LIPITOR) 40 MG tablet Take 1 tablet (40 mg total) by mouth daily at 6 PM. (Patient taking differently: Take 40 mg by mouth daily.) 30 tablet 3   Cholecalciferol (VITAMIN D-3) 125 MCG (5000 UT) TABS Take 5,000 Units by mouth in the morning.     Cyanocobalamin (VITAMIN B-12) 5000 MCG SUBL Place 5,000 mcg under the tongue every 3 (three) days.     ELIQUIS 5 MG TABS tablet Take 5 mg by mouth 2 (two) times daily.     EPINEPHrine 0.3 mg/0.3 mL IJ SOAJ injection Inject 0.3 mg into the muscle as needed for anaphylaxis.     gabapentin (NEURONTIN) 100 MG capsule TAKE 1 CAPSULE BY MOUTH AT  BEDTIME 90 capsule 3   glipiZIDE (GLUCOTROL) 5 MG tablet TAKE 1 TABLET BY MOUTH  TWICE DAILY BEFORE A MEAL  (Patient taking differently: 2.5 mg.) 180 tablet 3   Insulin Pen Needle (PEN NEEDLES) 32G X 4 MM MISC Use as instructed to administer weekly injection. 30 each 1   Insulin Syringes, Disposable, U-100 0.5 ML MISC Use 1x a day 100 each 3   levothyroxine (SYNTHROID) 25 MCG tablet Take 25 mcg by mouth daily before breakfast.     metFORMIN (GLUCOPHAGE) 1000 MG tablet Take 1 tablet (1,000 mg total) by mouth 2 (two) times daily with a meal. 180 tablet 3   metoprolol succinate (TOPROL-XL) 100 MG 24 hr tablet Take 100 mg by mouth in the morning and at bedtime.     Multiple Vitamins-Minerals (PRESERVISION AREDS 2 PO) Take 1 tablet by mouth in the morning and at bedtime.     nitroGLYCERIN (NITROSTAT) 0.4 MG SL tablet Place 1 tablet (0.4 mg total) under the tongue every 5 (five) minutes as needed for chest pain. 25 tablet 1   ONETOUCH DELICA LANCETS FINE MISC Use to check sugar daily 100 each 5   ONETOUCH VERIO test strip USE 1 STRIP TO CHECK GLUCOSE ONCE DAILY 100 each 3   Semaglutide,0.25 or 0.'5MG'$ /DOS, (OZEMPIC, 0.25 OR 0.5 MG/DOSE,) 2 MG/1.5ML SOPN Inject 0.5 mg into the skin once a week. 4.5 mL 3   tamsulosin (FLOMAX) 0.4 MG CAPS capsule TAKE 1 CAPSULE BY MOUTH DAILY 100 capsule 2   No current facility-administered medications on file prior to visit.    BP 120/70   Pulse 70   Temp (!) 97.5 F (36.4 C) (Oral)   Ht '5\' 11"'$  (1.803 m)   Wt 255 lb (115.7 kg)   SpO2 97%   BMI 35.57 kg/m       Objective:   Physical Exam  Vitals and nursing note reviewed.  Constitutional:      Appearance: Normal appearance.  Cardiovascular:     Rate and Rhythm: Normal rate and regular rhythm.  Musculoskeletal:        General: Tenderness present. Normal range of motion.  Skin:    General: Skin is warm and dry.     Capillary Refill: Capillary refill takes less than 2 seconds.  Neurological:     General: No focal deficit present.     Mental Status: He is alert and oriented to person, place, and time.   Psychiatric:        Mood and Affect: Mood normal.        Behavior: Behavior normal.        Thought Content: Thought content normal.       Assessment & Plan:   1. Acute right-sided low back pain without sciatica  - cyclobenzaprine (FLEXERIL) 10 MG tablet; Take 1 tablet (10 mg total) by mouth at bedtime.  Dispense: 15 tablet; Refill: 0   [name], NP

## 2022-08-07 DIAGNOSIS — K1121 Acute sialoadenitis: Secondary | ICD-10-CM | POA: Diagnosis not present

## 2022-08-08 DIAGNOSIS — D225 Melanocytic nevi of trunk: Secondary | ICD-10-CM | POA: Diagnosis not present

## 2022-08-08 DIAGNOSIS — Z85828 Personal history of other malignant neoplasm of skin: Secondary | ICD-10-CM | POA: Diagnosis not present

## 2022-08-08 DIAGNOSIS — D692 Other nonthrombocytopenic purpura: Secondary | ICD-10-CM | POA: Diagnosis not present

## 2022-08-08 DIAGNOSIS — L821 Other seborrheic keratosis: Secondary | ICD-10-CM | POA: Diagnosis not present

## 2022-08-08 DIAGNOSIS — L570 Actinic keratosis: Secondary | ICD-10-CM | POA: Diagnosis not present

## 2022-08-12 ENCOUNTER — Encounter: Payer: Self-pay | Admitting: Cardiovascular Disease

## 2022-08-12 ENCOUNTER — Ambulatory Visit: Payer: Medicare Other | Attending: Cardiovascular Disease | Admitting: Cardiovascular Disease

## 2022-08-12 VITALS — BP 136/80 | HR 87 | Ht 71.0 in | Wt 255.2 lb

## 2022-08-12 DIAGNOSIS — I4891 Unspecified atrial fibrillation: Secondary | ICD-10-CM | POA: Diagnosis not present

## 2022-08-12 NOTE — Progress Notes (Signed)
Cardiology Office Note:    Date:  08/12/2022   ID:  Charles [name], DOB March 03, 1941, MRN [id number]  PCP:  [name], NP   [facility] Providers Cardiologist:  None Electrophysiologist:  Charles Quitter, MD     Referring MD: [name], NP   Chief complaint: fatigue  History of Present Illness:    Charles [name] is a 81 y.o. male with a hx of coronary disease status post myocardial infarction 10/2015, history of adenomatous polyps type 2 diabetes, hypertension, for for arrhythmia management.  Atrial fibrillation is documented on an ECG from 10/20/2015. This occurred at the time of an admission for NSTEMI. I do not have records from that admission otherwise.  The patient recalls that he had an episode of atrial fibrillation about a year ago.  At that time, he was very fatigued and noticed that he was not able to walk from the parking lot into the [facility] where he exercises regularly.  He was started on amiodarone by his cardiologist, Dr. Terrence Dupont sometime around March 2023.  Per the patient's recollection, this is because he had recurrence of atrial fibrillation.    When he saw his endocrinologist in August, he expressed a concern that amiodarone could affect his thyroid levels.  His T4 and T3 returned within normal limits, but the TSH was significantly elevated at 9.93. He was advised to discuss amiodarone discontinuation with cardiology.   He was seen by Dr. Terrence Dupont, and according to the patient, was noted to be in atrial flutter at that time.  He was subsequently referred to EP.  He does not have significant palpitations, but he does feel short of breath and fatigue.  He is not able to exercise to the extent he was previously.  08/12/2022 He reports that he is doing very well today. He underwent PVI and CTI for fib and flutter on November 3 with good result. We did notice some swelling in the right submandibular area after the procedure. He has not had any bruising in the  area. He notes that he has had submandibular swelling that occurred after receiving contrast dye in the past. He had a CT a few days prior to the ablation.   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[place] 2000   left nephrectomy   Coronary artery disease    Dysrhythmia    PAF   Eczema    GERD (gastroesophageal reflux disease)    Hernia, ventral    History of blood transfusion    Hyperlipidemia    Hypertension    Myocardial infarction ([place])    Neuropathy    Obesity    Personal history of adenomatous colonic polyps 07/20/2012   3 + adenomas 2009 07/20/2012 2 diminutive polyps     Pneumonia 1942; 1950s X 1   Type II diabetes mellitus ([place])    type II   Walking pneumonia 2000's X 1    Past Surgical History:  Procedure Laterality Date   A-FLUTTER ABLATION N/A 07/12/2022   Procedure: A-FLUTTER ABLATION;  Surgeon: Charles Quitter, MD;  Location: [place] [facility];  Service: Cardiovascular;  Laterality: N/A;   ATRIAL FIBRILLATION ABLATION N/A 07/12/2022   Procedure: ATRIAL FIBRILLATION ABLATION;  Surgeon: Myles Gip, Yetta Barre, MD;  Location: [place]  [facility];  Service: Cardiovascular;  Laterality: N/A;   CARDIAC CATHETERIZATION N/A 10/19/2015   Procedure: Left Heart Cath and Coronary Angiography;  Surgeon: Charolette Forward, MD;  Location: [place] [facility];  Service: Cardiovascular;  Laterality: N/A;   CARDIAC CATHETERIZATION N/A 10/19/2015   Procedure: Coronary Stent Intervention;  Surgeon: Charolette Forward, MD;  Location: [place] [facility];  Service: Cardiovascular;  Laterality: N/A;   CATARACT EXTRACTION, BILATERAL Bilateral 01/2018   Dr. Kathrin Penner did surgery and he developed MD and is seeing specialist now for MD treatment   COLONOSCOPY     "nothing showed up this time"   COLONOSCOPY W/ BIOPSIES AND POLYPECTOMY  X 1   CORONARY  ANGIOPLASTY     EXCISION MASS NECK Left 01/08/2021   Procedure: EXCISION MASS NECK;  Surgeon: Charles Quitter, MD;  Location: [place];  Service: ENT;  Laterality: Left;   FEMUR IM NAIL Right 07/26/2013   Procedure: INTRAMEDULLARY (IM) NAIL FEMORAL subtrochanteric;  Surgeon: Mauri Pole, MD;  Location: [place];  Service: Orthopedics;  Laterality: Right;   FRACTURE SURGERY     INGUINAL HERNIA REPAIR Left >3 times   INGUINAL HERNIA REPAIR Right 2012   NEPHRECTOMY Left 2000   TONSILLECTOMY  1940s    Current Medications: Current Meds  Medication Sig   aspirin 81 MG chewable tablet Chew 81 mg by mouth in the morning.   atorvastatin (LIPITOR) 40 MG tablet Take 1 tablet (40 mg total) by mouth daily at 6 PM. (Patient taking differently: Take 40 mg by mouth daily.)   Cholecalciferol (VITAMIN D-3) 125 MCG (5000 UT) TABS Take 5,000 Units by mouth in the morning.   Cyanocobalamin (VITAMIN B-12) 5000 MCG SUBL Place 5,000 mcg under the tongue every 3 (three) days.   cyclobenzaprine (FLEXERIL) 10 MG tablet Take 1 tablet (10 mg total) by mouth at bedtime.   ELIQUIS 5 MG TABS tablet Take 5 mg by mouth 2 (two) times daily.   EPINEPHrine 0.3 mg/0.3 mL IJ SOAJ injection Inject 0.3 mg into the muscle as needed for anaphylaxis.   gabapentin (NEURONTIN) 100 MG capsule TAKE 1 CAPSULE BY MOUTH AT  BEDTIME   glipiZIDE (GLUCOTROL) 5 MG tablet TAKE 1 TABLET BY MOUTH  TWICE DAILY BEFORE A MEAL (Patient taking differently: 2.5 mg.)   Insulin Pen Needle (PEN NEEDLES) 32G X 4 MM MISC Use as instructed to administer weekly injection.   Insulin Syringes, Disposable, U-100 0.5 ML MISC Use 1x a day   levothyroxine (SYNTHROID) 25 MCG tablet Take 25 mcg by mouth daily before breakfast.   metFORMIN (GLUCOPHAGE) 1000 MG tablet Take 1 tablet (1,000 mg total) by mouth 2 (two) times daily with a meal.   metoprolol succinate (TOPROL-XL) 100 MG 24 hr tablet Take 100 mg by mouth in the morning and at bedtime.   Multiple Vitamins-Minerals  (PRESERVISION AREDS 2 PO) Take 1 tablet by mouth in the morning and at bedtime.   nitroGLYCERIN (NITROSTAT) 0.4 MG SL tablet Place 1 tablet (0.4 mg total) under the tongue every 5 (five) minutes as needed for chest pain.   ONETOUCH DELICA LANCETS FINE MISC Use to check sugar daily   ONETOUCH VERIO test strip USE 1 STRIP TO CHECK GLUCOSE ONCE DAILY   Semaglutide,0.25 or 0.'5MG'$ /DOS, (OZEMPIC, 0.25 OR 0.5 MG/DOSE,) 2 MG/1.5ML SOPN Inject 0.5 mg into the skin once a week.   tamsulosin (FLOMAX) 0.4 MG CAPS capsule TAKE 1 CAPSULE BY MOUTH DAILY     Allergies:   Bee venom  Social History   Socioeconomic History   Marital status: Widowed    Spouse name: Not on file   Number of children: 3   Years of education: Not on file   Highest education level: Master's degree (e.g., MA, MS, MEng, MEd, MSW, MBA)  Occupational History   Occupation: retired  Tobacco Use   Smoking status: Former    Years: 20.00    Types: Cigarettes, Pipe, Cigars    Quit date: 09/09/1968    Years since quitting: 53.9   Smokeless tobacco: Never   Tobacco comments:    smoking cigars- quit in 2006  , quit pipe 2000  Vaping Use   Vaping Use: Never used  Substance and Sexual Activity   Alcohol use: Not Currently    Alcohol/week: 2.0 standard drinks of alcohol    Types: 2 Glasses of wine per week    Comment: 2 glasses on Tuesday   Drug use: No   Sexual activity: Never  Other Topics Concern   Not on file  Social History Narrative   Regular exercise: goes to the [facility] and walks   Caffeine use: daily; coffee   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     widowed    Three children, One in [place], one in [place] one in [place] - 2 : youngest daughter lives with him   Social Determinants of Health   Financial Resource Strain: Medium Risk (05/28/2022)   Overall Financial Resource Strain (CARDIA)    Difficulty of Paying Living Expenses: Somewhat hard  Food Insecurity: No Food Insecurity (12/26/2021)    Hunger Vital Sign    Worried About Running Out of Food in the Last Year: Never true    Ran Out of Food in the Last Year: Never true  Transportation Needs: No Transportation Needs (12/26/2021)   PRAPARE - Hydrologist (Medical): No    Lack of Transportation (Non-Medical): No  Physical Activity: Sufficiently Active (12/26/2021)   Exercise Vital Sign    Days of Exercise per Week: 5 days    Minutes of Exercise per Session: 140 min  Stress: No Stress Concern Present (12/26/2021)   [place]    Feeling of Stress : Not at all  Social Connections: Moderately Integrated (12/26/2021)   Social Connection and Isolation Panel [NHANES]    Frequency of Communication with Friends and Family: More than three times a week    Frequency of Social Gatherings with Friends and Family: More than three times a week    Attends Religious Services: More than 4 times per year    Active Member of [employer] or Organizations: Yes    Attends Archivist Meetings: More than 4 times per year    Marital Status: Widowed  Recent Concern: Social Connections - Moderately Isolated (10/11/2021)   Social Connection and Isolation Panel [NHANES]    Frequency of Communication with Friends and Family: More than three times a week    Frequency of Social Gatherings with Friends and Family: More than three times a week    Attends Religious Services: More than 4 times per year    Active Member of [employer] or Organizations: No    Attends Archivist Meetings: Not on file    Marital Status: Widowed     Family History: The patient's family history includes Cancer in an other family member; Diabetes in his mother and sister; Drug abuse in an other  family member; Heart disease in an other family member; Lung disease in an other family member. There is no history of Colon cancer or Stomach cancer.  ROS:   Please see the history of present  illness.    All other systems reviewed and are negative.  EKGs/Labs/Other Studies Reviewed:     Exercise stress test: 1.o mets, max HR 90 bpm no ECG changes  EKG:  Last EKG results: today - sinus rhythm, RBBB   Recent Labs: 10/09/2021: ALT 25 05/08/2022: TSH 9.93 06/28/2022: BUN 23; Creatinine, Ser 1.37; Potassium 4.9; Sodium 141 07/16/2022: Hemoglobin 12.6; Platelets 199.0    Risk Assessment/Calculations:    CHA2DS2-VASc Score = 5   This indicates a 7.2% annual risk of stroke. The patient's score is based upon: CHF History: 0 HTN History: 1 Diabetes History: 1 Stroke History: 0 Vascular Disease History: 1 Age Score: 2 Gender Score: 0          Physical Exam:    VS:  BP 136/80   Pulse 87   Ht '5\' 11"'$  (1.803 m)   Wt 255 lb 3.2 oz (115.8 kg)   SpO2 95%   BMI 35.59 kg/m     Wt Readings from Last 3 Encounters:  08/12/22 255 lb 3.2 oz (115.8 kg)  07/31/22 255 lb (115.7 kg)  07/16/22 260 lb (117.9 kg)     GEN:  Well nourished, well developed in no acute distress CARDIAC: RRR, no murmurs, rubs, gallops RESPIRATORY:  Normal work of breathing MUSCULOSKELETAL: trace edema    ASSESSMENT & PLAN:    Atrial fibrillation and typical atrial flutter: s/p ablation, maintaining sinus rhythm.  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 Continue eliquis '5mg'$  PO BID Hypothyroid: monitor after discontinuation of amiodarone High risk medication: Will DC 5 days prior to ablation           Medication Adjustments/Labs and Tests Ordered: Current medicines are reviewed at length with the patient today.  Concerns regarding medicines are outlined above.  No orders of the defined types were placed in this encounter.  No orders of the defined types were placed in this encounter.    Signed, Charles Quitter, MD  08/12/2022 11:55 AM    [employer]

## 2022-08-12 NOTE — Patient Instructions (Signed)
Medication Instructions:  Your physician recommends that you continue on your current medications as directed. Please refer to the Current Medication list given to you today.  *If you need a refill on your cardiac medications before your next appointment, please call your pharmacy*   Follow-Up: At [facility], you and your health needs are our priority.  As part of our continuing mission to provide you with exceptional heart care, we have created designated Provider Care Teams.  These Care Teams include your primary Cardiologist (physician) and Advanced Practice Providers (APPs -  Physician Assistants and Nurse Practitioners) who all work together to provide you with the care you need, when you need it.  Your next appointment:   1 year(s)  The format for your next appointment:   In Person  Provider:   You may see Augustus E Mealor, MD or one of the following Advanced Practice Providers on your designated Care Team:   Renee Ursuy, PA-C Michael "Andy" Tillery, PA-C    Important Information About Sugar       

## 2022-08-27 ENCOUNTER — Telehealth: Payer: Self-pay | Admitting: Pharmacist

## 2022-08-27 NOTE — Chronic Care Management (AMB) (Signed)
Chronic Care Management Pharmacy Assistant   Name: Charles [name]  MRN: [id number] DOB: 1940/10/07  Reason for Encounter: Disease State / Hypertension and Diabetes Assessment Call   Conditions to be addressed/monitored: HTN and DMII  Recent office visits:  07/31/2022 [name] NP - Patient was seen for Acute right-sided low back pain without sciatica. Started Flexeril 10 mg at bedtime. No follow up noted.   07/16/2022 Alysia Penna MD - Patient was seen for parotid mass. No medication changes. No follow up noted.   Recent consult visits:  08/12/2022 Doralee Albino MD (cardiology) - Patient was seen for Atrial fibrillation, unspecified type. No medication changes. Follow up in 1 year.   08/07/2022 Melida Quitter MD (ENT) - Patient was seen for acute parotitis. No medication changes. Follow up in 1 month.   07/24/2022 Melida Quitter MD (ENT) - Patient was seen for acute parotitis. Started prednisone taper. Follow up in 2 weeks.  07/17/2022  Melida Quitter MD (ENT) - Patient was seen for Warthin's tumor and Acute parotitis. Started Clindamycin. Follow up in 1 week.   06/24/2022 Doralee Albino MD (cardiology) - Patient was seen for atrial fibrillation. No medication changes. No follow up noted.   Hospital visits:  Admitted to [facility] on 07/12/2022 (9 hours) due to Atrial Fibrillation Ablation.    New?Medications Started at [facility] Discharge:?? No medications started Medication Changes at Hospital Discharge: No medication changes. Medications Discontinued at Hospital Discharge: No medications discontinued Medications that remain the same after Hospital Discharge:??  -All other medications will remain the same.    Medications: Outpatient Encounter Medications as of 08/27/2022  Medication Sig Note   aspirin 81 MG chewable tablet Chew 81 mg by mouth in the morning.    atorvastatin (LIPITOR) 40 MG tablet Take 1 tablet (40 mg total) by mouth daily at 6 PM. (Patient  taking differently: Take 40 mg by mouth daily.)    Cholecalciferol (VITAMIN D-3) 125 MCG (5000 UT) TABS Take 5,000 Units by mouth in the morning.    Cyanocobalamin (VITAMIN B-12) 5000 MCG SUBL Place 5,000 mcg under the tongue every 3 (three) days.    cyclobenzaprine (FLEXERIL) 10 MG tablet Take 1 tablet (10 mg total) by mouth at bedtime.    ELIQUIS 5 MG TABS tablet Take 5 mg by mouth 2 (two) times daily.    EPINEPHrine 0.3 mg/0.3 mL IJ SOAJ injection Inject 0.3 mg into the muscle as needed for anaphylaxis. 07/12/2022: Used over 10 years ago.   gabapentin (NEURONTIN) 100 MG capsule TAKE 1 CAPSULE BY MOUTH AT  BEDTIME    glipiZIDE (GLUCOTROL) 5 MG tablet TAKE 1 TABLET BY MOUTH  TWICE DAILY BEFORE A MEAL (Patient taking differently: 2.5 mg.) 07/10/2022: Holding until after the procedure   Insulin Pen Needle (PEN NEEDLES) 32G X 4 MM MISC Use as instructed to administer weekly injection.    Insulin Syringes, Disposable, U-100 0.5 ML MISC Use 1x a day    levothyroxine (SYNTHROID) 25 MCG tablet Take 25 mcg by mouth daily before breakfast.    metFORMIN (GLUCOPHAGE) 1000 MG tablet Take 1 tablet (1,000 mg total) by mouth 2 (two) times daily with a meal. 07/10/2022: Holding until after the procedure   metoprolol succinate (TOPROL-XL) 100 MG 24 hr tablet Take 100 mg by mouth in the morning and at bedtime.    Multiple Vitamins-Minerals (PRESERVISION AREDS 2 PO) Take 1 tablet by mouth in the morning and at bedtime.    nitroGLYCERIN (NITROSTAT) 0.4 MG SL  tablet Place 1 tablet (0.4 mg total) under the tongue every 5 (five) minutes as needed for chest pain. 07/12/2022: Never used as of 07/12/22.   ONETOUCH DELICA LANCETS FINE MISC Use to check sugar daily    ONETOUCH VERIO test strip USE 1 STRIP TO CHECK GLUCOSE ONCE DAILY    Semaglutide,0.25 or 0.'5MG'$ /DOS, (OZEMPIC, 0.25 OR 0.5 MG/DOSE,) 2 MG/1.5ML SOPN Inject 0.5 mg into the skin once a week.    tamsulosin (FLOMAX) 0.4 MG CAPS capsule TAKE 1 CAPSULE BY MOUTH DAILY     No facility-administered encounter medications on file as of 08/27/2022.  Fill History:  Dispensed Days Supply Quantity Provider Pharmacy  ELIQUIS 5 MG TABLET 08/20/2022 30 60 each      Dispensed Days Supply Quantity Provider Pharmacy  ATORVASTATIN CALCIUM  40 MG TABS 08/10/2022 100 100      Dispensed Days Supply Quantity Provider Pharmacy  GABAPENTIN  100 MG CAPS 05/09/2022 100 100 capsule      Dispensed Days Supply Quantity Provider Pharmacy  GLIPIZIDE  5 MG TABS 05/09/2022 100 200 tablet      Dispensed Days Supply Quantity Provider Pharmacy  LEVOTHYROXIN 25MCG TAB 06/17/2022 90 90 each      Dispensed Days Supply Quantity Provider Pharmacy  METOPROLOL SUCCINATE ER  100 MG TB24 08/23/2022 100 150 tablet      Dispensed Days Supply Quantity Provider Pharmacy  semaglutide (Ozempic) 0.25 mg or 0.5 mg (2 mg/3 mL) pen injector 02/05/2022    No Pharmacy Name  [place]  2 MG/3ML SOPN 12/19/2021 84 9 mL      Dispensed Days Supply Quantity Provider Pharmacy  TAMSULOSIN HYDROCHLORIDE  0.4 MG CAPS 07/26/2022 100 100 capsule     Reviewed chart prior to disease state call. Spoke with patient regarding BP  Recent Office Vitals: BP Readings from Last 3 Encounters:  08/12/22 136/80  07/31/22 120/70  07/16/22 120/84   Pulse Readings from Last 3 Encounters:  08/12/22 87  07/31/22 70  07/16/22 74    Wt Readings from Last 3 Encounters:  08/12/22 255 lb 3.2 oz (115.8 kg)  07/31/22 255 lb (115.7 kg)  07/16/22 260 lb (117.9 kg)     Kidney Function Lab Results  Component Value Date/Time   CREATININE 1.37 (H) 06/28/2022 11:05 AM   CREATININE 1.26 06/24/2022 12:24 PM   CREATININE 1.13 04/06/2021 03:50 PM   CREATININE 1.21 (H) 02/18/2017 08:30 AM   GFR 55.98 (L) 10/09/2021 10:16 AM   GFRNONAA >60 01/08/2021 08:00 AM   GFRNONAA 58 (L) 02/18/2017 08:30 AM   GFRAA 53 (L) 09/29/2018 10:14 AM   GFRAA 67 02/18/2017 08:30 AM       Latest Ref Rng & Units 06/28/2022   11:05 AM 06/24/2022    12:24 PM 10/09/2021   10:16 AM  BMP  Glucose 70 - 99 mg/dL 190  146  187   BUN 8 - 27 mg/dL '23  17  18   '$ Creatinine 0.76 - 1.27 mg/dL 1.37  1.26  1.22   BUN/Creat Ratio 10 - '24 17  13    '$ Sodium 134 - 144 mmol/L 141  140  137   Potassium 3.5 - 5.2 mmol/L 4.9  5.9  4.9   Chloride 96 - 106 mmol/L 102  102  98   CO2 20 - 29 mmol/L '25  26  31   '$ Calcium 8.6 - 10.2 mg/dL 9.1  9.2  9.4    Recent Relevant Labs: Lab Results  Component Value Date/Time  HGBA1C 5.9 (A) 05/08/2022 08:43 AM   HGBA1C 7.7 (A) 12/19/2021 09:47 AM   HGBA1C 7.9 (H) 09/29/2018 02:23 AM   HGBA1C 6.9 (H) 08/08/2016 08:56 AM   MICROALBUR 27.5 (H) 07/16/2022 03:28 PM   MICROALBUR 13.8 (H) 08/08/2016 08:56 AM    Kidney Function Lab Results  Component Value Date/Time   CREATININE 1.37 (H) 06/28/2022 11:05 AM   CREATININE 1.26 06/24/2022 12:24 PM   CREATININE 1.13 04/06/2021 03:50 PM   CREATININE 1.21 (H) 02/18/2017 08:30 AM   GFR 55.98 (L) 10/09/2021 10:16 AM   GFRNONAA >60 01/08/2021 08:00 AM   GFRNONAA 58 (L) 02/18/2017 08:30 AM   GFRAA 53 (L) 09/29/2018 10:14 AM   GFRAA 67 02/18/2017 08:30 AM   Current antihypertensive regimen:  Metoprolol 100 mg twice daily  Current antihyperglycemic regimen:  Glipzide 5 mg twice daily Metformin 1000 mg twice daily Ozempic 4.5 mg weekly  What recent interventions/DTPs have been made by any provider to improve Blood Pressure and blood sugar control since last CPP Visit: No recent interventions  Any recent hospitalizations or ED visits since last visit with CPP? Admitted to [facility] on 07/12/2022 (9 hours) due to Atrial Fibrillation Ablation.  Patient denies hypoglycemic symptoms, including None  Patient denies hyperglycemic symptoms, including none  How often are you checking your blood sugar? Patient states he is checking blood sugars daily before breakfast.   What are your blood sugars ranging?  Fasting: Patient states his blood sugars have been  between 120-130 in the mornings before breakfast.   During the week, how often does your blood glucose drop below 70? Patient states his blood sugars never drop below 70  Are you checking your feet daily/regularly? Patient states he is checking his feet, never has any issues with his feet.   How often are you checking your Blood Pressure?   Patient checks his blood pressures 5 days per week at the [facility],  Current home BP readings:  Patient states his readings are between 110/65 - 120/70  What diet changes have been made to improve Blood Pressure Control?  Patient follows a low carb except  Breakfast - patient will have cereal or cheese toast or eggs Lunch - patient will eat out at a variety of places Dinner - patient will have a light dinner like soup or salad if he eats out for lunch  What exercise is being done to improve your Blood Pressure Control?  Patient goes to the [employer] 5 days per week, does his own housework and yard work.   Adherence Review: Is the patient currently on a STATIN medication? Yes Is the patient currently on ACE/ARB medication? No Does the patient have >5 day gap between last estimated fill dates? No  Care Gaps: AWV - scheduled 12/30/2022 Last BP - 136/80 on 08/12/2022 Last A1C - 5.9 on 05/08/2022 Tdap - overdue Flu - overdue Covid - overdue  Star Rating Drugs: Atorvastatin 40 mg - last filled 08/10/2022 100 DS at [facility] Glipizide 5 mg - last filled 05/09/2022 100 DS at [facility] Metformin 1000 mg - last filled 06/21/2022 100 DS at [facility] '2mg'$ /1.16m - last filled 12/19/2021 84 DS [facility] verified with [place] [phone]

## 2022-09-13 ENCOUNTER — Ambulatory Visit: Payer: Medicare Other | Admitting: Internal Medicine

## 2022-09-13 ENCOUNTER — Encounter: Payer: Self-pay | Admitting: Internal Medicine

## 2022-09-13 VITALS — BP 112/68 | HR 68 | Ht 71.0 in | Wt 247.0 lb

## 2022-09-13 DIAGNOSIS — R7989 Other specified abnormal findings of blood chemistry: Secondary | ICD-10-CM | POA: Diagnosis not present

## 2022-09-13 DIAGNOSIS — E1165 Type 2 diabetes mellitus with hyperglycemia: Secondary | ICD-10-CM

## 2022-09-13 DIAGNOSIS — E785 Hyperlipidemia, unspecified: Secondary | ICD-10-CM

## 2022-09-13 DIAGNOSIS — E1159 Type 2 diabetes mellitus with other circulatory complications: Secondary | ICD-10-CM | POA: Diagnosis not present

## 2022-09-13 DIAGNOSIS — E538 Deficiency of other specified B group vitamins: Secondary | ICD-10-CM

## 2022-09-13 DIAGNOSIS — G6289 Other specified polyneuropathies: Secondary | ICD-10-CM

## 2022-09-13 LAB — POCT GLYCOSYLATED HEMOGLOBIN (HGB A1C): Hemoglobin A1C: 7.7 % — AB (ref 4.0–5.6)

## 2022-09-13 LAB — LIPID PANEL
Cholesterol: 115 mg/dL (ref 0–200)
HDL: 38.9 mg/dL — ABNORMAL LOW (ref >39.00)
LDL Cholesterol: 47 mg/dL (ref 0–99)
NonHDL: 75.79
Total CHOL/HDL Ratio: 3
Triglycerides: 145 mg/dL (ref 0.0–149.0)
VLDL: 29 mg/dL (ref 0.0–40.0)

## 2022-09-13 LAB — T3, FREE: T3, Free: 3.6 pg/mL (ref 2.3–4.2)

## 2022-09-13 LAB — T4, FREE: Free T4: 1.17 ng/dL (ref 0.60–1.60)

## 2022-09-13 LAB — VITAMIN B12: Vitamin B-12: >1500 pg/mL — ABNORMAL HIGH (ref 211–911)

## 2022-09-13 LAB — TSH: TSH: 4.59 u[IU]/mL (ref 0.35–5.50)

## 2022-09-13 NOTE — Progress Notes (Signed)
Patient ID: Charles [name], male   DOB: Mar 25, 1941, 82 y.o.   MRN: [id number]  HPI: Charles [name] is a 82 y.o.-year-old male, returning for f/u for DM2 dx 2008, uncontrolled, with complications (CAD - s/p NSTEMI, PN, mild CKD). Last visit 4 months ago.  Interim history: No increased urination, blurry vision, nausea, chest pain. He was started on amiodarone approximately 11/2021.  At last visit he wanted me to check his thyroid tests as he heard that amiodarone implants leads.  A TSH was elevated.  Since then, he came off the medication. He had heart ablation in 07/2022. Since last visit, he had parotiditis - as a reaction to CT contrast - will see Dr. Redmond Baseman with ENT. He fel in [place] recently - no fractures.  DM2: Reviewed HbA1c levels: Lab Results  Component Value Date   HGBA1C 5.9 (A) 05/08/2022   HGBA1C 7.7 (A) 12/19/2021   HGBA1C 7.5 (A) 08/20/2021  12/2013: 8.6%  Pt is on a regimen of: - Metformin 1000 mg 2x a day >> 2000 mg with dinner (but sugars worse) >> 1000 mg 2x a day - Glipizide 10 >> 5 mg 2x a day before meals >> 5 mg in a.m. and 5-10 mg before dinner >> 2.5 mg 2x a day before meals >> 1x a day before dinner -  >> 700$ >> stopped since last visit due to price We tried to start Ozempic 0.25 mg weekly -08/2020 >> too expensive. In the past, I recommended  NPH 14 units at bedtime, but he did not start. We tried Rybelsus 3 >> 6 mg daily-added 12/2018 >> $$$ >> stopped 01/2019 He checked with his insurance and none of the SGLT2 inhibitors are covered for him.  Pt checks his sugars once a day: - am:  90s, 110-150, 200 >> 115-184 >> 50, 88-110, 146 >> 79, 105-139, 151 - 2h after b'fast: 68x1 >> n/c >> 201, 206 >> n/c - before lunch:  n/c >> 69, 79 >> n/c >> 90 x1 >> n/c - 2h after lunch: 123-176 >> n/c - before dinner  160s >> n/c >> 97 >> 90s >> n/c >> 80 >> n/c - 2h after dinner: n/c >> 240 >> n/c - bedtime: 110-150 >> 160s >> n/c Lowest sugar was  80s >> 100 >> 50 >> 79;  he has hypoglycemia awareness in the 80s. Highest sugar was 200 (forgot meds at night) >> 180 >> 146 >> 200.  -+ Mild CKD, last BUN/creatinine:  Lab Results  Component Value Date   BUN 23 06/28/2022   CREATININE 1.37 (H) 06/28/2022  Of note, he has a history of nephrectomy. On lisinopril 10.  -+ HL; last set of lipids: Lab Results  Component Value Date   CHOL 148 10/09/2021   HDL 45.20 10/09/2021   LDLCALC 63 10/09/2021   LDLDIRECT 139.8 07/08/2014   TRIG 196.0 (H) 10/09/2021   CHOLHDL 3 10/09/2021  On Lipitor 40.  - last eye exam was in 01/2022: No DR; now seeing Dr. [place] and Dr. Iona Hansen- retinal vein occlusion, + cataracts, + macular "hole".  He has a history of eye surgeries.  He gets intraocular injections  - his numbness and tingling in feet improved after starting B12. Foot exam 10/09/2021.  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  B12 deficiency: -Diagnosed during investigation for numbness and tingling (peripheral neuropathy) -  He was initially on B12 injections, then on 5000 mcg daily -He was on 5000 mcg every 3 days, changed by PCP 09/2019.   -Afterwards, we switched to 1000 mcg every day, but when he ran out he started back on 5000 mcg and he takes this every 3 days again.  Reviewed his B12 levels: Lab Results  Component Value Date   VITAMINB12 832 10/09/2021   VITAMINB12 900 10/06/2020   VITAMINB12 748 12/22/2019   VITAMINB12 1,033 (H) 10/08/2019   VITAMINB12 1,172 (H) 08/19/2018   VITAMINB12 >1500 (H) 05/04/2018   VITAMINB12 1,249 (H) 02/18/2017   VITAMINB12 234 10/22/2016   VITAMINB12 217 04/19/2016   VITAMINB12 94 (L) 10/13/2015   He was admitted with NSTEMI 10/18/2015.  He had stents placed then.  He was going to the gym 5 out of 7 days, but stopped during the coronavirus pandemic.  He restarted to go to the gym  afterwards.  ROS: + See HPI  I reviewed pt's medications, allergies, PMH, social hx, family hx, and changes were documented in the history of present illness. Otherwise, unchanged from my initial visit note.  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[place]) 2000   left nephrectomy   Coronary artery disease    Dysrhythmia    PAF   Eczema    GERD (gastroesophageal reflux disease)    Hernia, ventral    History of blood transfusion    Hyperlipidemia    Hypertension    Myocardial infarction ([place])    Neuropathy    Obesity    Personal history of adenomatous colonic polyps 07/20/2012   3 + adenomas 2009 07/20/2012 2 diminutive polyps     Pneumonia 1942; 1950s X 1   Type II diabetes mellitus ([place])    type II   Walking pneumonia 2000's X 1   Past Surgical History:  Procedure Laterality Date   A-FLUTTER ABLATION N/A 07/12/2022   Procedure: A-FLUTTER ABLATION;  Surgeon: Melida Quitter, MD;  Location: [facility];  Service: Cardiovascular;  Laterality: N/A;   ATRIAL FIBRILLATION ABLATION N/A 07/12/2022   Procedure: ATRIAL FIBRILLATION ABLATION;  Surgeon: Melida Quitter, MD;  Location: [facility];  Service: Cardiovascular;  Laterality: N/A;   CARDIAC CATHETERIZATION N/A 10/19/2015   Procedure: Left Heart Cath and Coronary Angiography;  Surgeon: Charolette Forward, MD;  Location: [facility];  Service: Cardiovascular;  Laterality: N/A;   CARDIAC CATHETERIZATION N/A 10/19/2015   Procedure: Coronary Stent Intervention;  Surgeon: Charolette Forward, MD;  Location: [facility];  Service: Cardiovascular;  Laterality: N/A;   CATARACT EXTRACTION, BILATERAL Bilateral 01/2018   Dr. Kathrin Penner did surgery and he developed MD and is seeing specialist now for MD treatment   COLONOSCOPY     "nothing showed up this time"    COLONOSCOPY W/ BIOPSIES AND POLYPECTOMY  X 1   CORONARY ANGIOPLASTY     EXCISION MASS NECK Left 01/08/2021   Procedure: EXCISION MASS NECK;  Surgeon: Melida Quitter, MD;  Location: [facility];  Service: ENT;  Laterality: Left;   FEMUR IM NAIL Right 07/26/2013   Procedure: INTRAMEDULLARY (IM) NAIL FEMORAL subtrochanteric;  Surgeon: Mauri Pole, MD;  Location: [facility];  Service: Orthopedics;  Laterality: Right;   FRACTURE SURGERY  INGUINAL HERNIA REPAIR Left >3 times   INGUINAL HERNIA REPAIR Right 2012   NEPHRECTOMY Left 2000   TONSILLECTOMY  1940s   Social History   Socioeconomic History   Marital status: Widowed    Spouse name: Not on file   Number of children: 3   Years of education: Not on file   Highest education level: Master's degree (e.g., MA, MS, MEng, MEd, MSW, MBA)  Occupational History   Occupation: retired  Tobacco Use   Smoking status: Former    Years: 20.00    Types: Cigarettes, Pipe, Cigars    Quit date: 09/09/1968    Years since quitting: 54.0   Smokeless tobacco: Never   Tobacco comments:    smoking cigars- quit in 2006  , quit pipe 2000  Vaping Use   Vaping Use: Never used  Substance and Sexual Activity   Alcohol use: Not Currently    Alcohol/week: 2.0 standard drinks of alcohol    Types: 2 Glasses of wine per week    Comment: 2 glasses on Tuesday   Drug use: No   Sexual activity: Never  Other Topics Concern   Not on file  Social History Narrative   Regular exercise: goes to the [facility] and walks   Caffeine use: daily; coffee   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     widowed    Three children, One in [place], one in [place] one in [place] - 2 : youngest daughter lives with him   Social Determinants of Health   Financial Resource Strain: Medium Risk (05/28/2022)   Overall Financial Resource Strain (CARDIA)    Difficulty of Paying Living Expenses: Somewhat hard  Food Insecurity: No Food Insecurity (12/26/2021)   Hunger Vital Sign     Worried About Running Out of Food in the Last Year: Never true    Ran Out of Food in the Last Year: Never true  Transportation Needs: No Transportation Needs (12/26/2021)   PRAPARE - Hydrologist (Medical): No    Lack of Transportation (Non-Medical): No  Physical Activity: Sufficiently Active (12/26/2021)   Exercise Vital Sign    Days of Exercise per Week: 5 days    Minutes of Exercise per Session: 140 min  Stress: No Stress Concern Present (12/26/2021)   [place]    Feeling of Stress : Not at all  Social Connections: Moderately Integrated (12/26/2021)   Social Connection and Isolation Panel [NHANES]    Frequency of Communication with Friends and Family: More than three times a week    Frequency of Social Gatherings with Friends and Family: More than three times a week    Attends Religious Services: More than 4 times per year    Active Member of [employer] or Organizations: Yes    Attends Archivist Meetings: More than 4 times per year    Marital Status: Widowed  Recent Concern: Social Connections - Moderately Isolated (10/11/2021)   Social Connection and Isolation Panel [NHANES]    Frequency of Communication with Friends and Family: More than three times a week    Frequency of Social Gatherings with Friends and Family: More than three times a week    Attends Religious Services: More than 4 times per year    Active Member of [employer] or Organizations: No    Attends Archivist Meetings: Not on file    Marital Status: Widowed  Human resources officer  Violence: Not At Risk (12/26/2021)   Humiliation, Afraid, Rape, and Kick questionnaire    Fear of Current or Ex-Partner: No    Emotionally Abused: No    Physically Abused: No    Sexually Abused: No   Current Outpatient Medications on File Prior to Visit  Medication Sig Dispense Refill   aspirin 81 MG chewable tablet Chew 81 mg by mouth in the  morning.     atorvastatin (LIPITOR) 40 MG tablet Take 1 tablet (40 mg total) by mouth daily at 6 PM. (Patient taking differently: Take 40 mg by mouth daily.) 30 tablet 3   Cholecalciferol (VITAMIN D-3) 125 MCG (5000 UT) TABS Take 5,000 Units by mouth in the morning.     Cyanocobalamin (VITAMIN B-12) 5000 MCG SUBL Place 5,000 mcg under the tongue every 3 (three) days.     cyclobenzaprine (FLEXERIL) 10 MG tablet Take 1 tablet (10 mg total) by mouth at bedtime. 15 tablet 0   ELIQUIS 5 MG TABS tablet Take 5 mg by mouth 2 (two) times daily.     EPINEPHrine 0.3 mg/0.3 mL IJ SOAJ injection Inject 0.3 mg into the muscle as needed for anaphylaxis.     gabapentin (NEURONTIN) 100 MG capsule TAKE 1 CAPSULE BY MOUTH AT  BEDTIME 90 capsule 3   glipiZIDE (GLUCOTROL) 5 MG tablet TAKE 1 TABLET BY MOUTH  TWICE DAILY BEFORE A MEAL (Patient taking differently: 2.5 mg.) 180 tablet 3   Insulin Pen Needle (PEN NEEDLES) 32G X 4 MM MISC Use as instructed to administer weekly injection. 30 each 1   Insulin Syringes, Disposable, U-100 0.5 ML MISC Use 1x a day 100 each 3   levothyroxine (SYNTHROID) 25 MCG tablet Take 25 mcg by mouth daily before breakfast.     metFORMIN (GLUCOPHAGE) 1000 MG tablet Take 1 tablet (1,000 mg total) by mouth 2 (two) times daily with a meal. 180 tablet 3   metoprolol succinate (TOPROL-XL) 100 MG 24 hr tablet Take 100 mg by mouth in the morning and at bedtime.     Multiple Vitamins-Minerals (PRESERVISION AREDS 2 PO) Take 1 tablet by mouth in the morning and at bedtime.     nitroGLYCERIN (NITROSTAT) 0.4 MG SL tablet Place 1 tablet (0.4 mg total) under the tongue every 5 (five) minutes as needed for chest pain. 25 tablet 1   ONETOUCH DELICA LANCETS FINE MISC Use to check sugar daily 100 each 5   ONETOUCH VERIO test strip USE 1 STRIP TO CHECK GLUCOSE ONCE DAILY 100 each 3   Semaglutide,0.25 or 0.'5MG'$ /DOS, (OZEMPIC, 0.25 OR 0.5 MG/DOSE,) 2 MG/1.5ML SOPN Inject 0.5 mg into the skin once a week. 4.5 mL 3    tamsulosin (FLOMAX) 0.4 MG CAPS capsule TAKE 1 CAPSULE BY MOUTH DAILY 100 capsule 2   No current facility-administered medications on file prior to visit.   Allergies  Allergen Reactions   Bee Venom Anaphylaxis   Family History  Problem Relation Age of Onset   Drug abuse Other    Cancer Other    Heart disease Other    Lung disease Other    Diabetes Mother    Diabetes Sister    Colon cancer Neg Hx    Stomach cancer Neg Hx    PE: BP 112/68 (BP Location: Right Arm, Patient Position: Sitting, Cuff Size: Normal)   Pulse 68   Ht '5\' 11"'$  (1.803 m)   Wt 247 lb (112 kg)   SpO2 98%   BMI 34.45 kg/m  Wt Readings from  Last 3 Encounters:  09/13/22 247 lb (112 kg)  08/12/22 255 lb 3.2 oz (115.8 kg)  07/31/22 255 lb (115.7 kg)   Constitutional: overweight, in NAD Eyes: EOMI, no exophthalmos ENT: no thyromegaly, no cervical lymphadenopathy but enlarged R parotid gland Cardiovascular: RRR, No MRG, +R>L LE swelling Respiratory: CTA B Musculoskeletal: no deformities Skin:  no rashes Neurological: no tremor with outstretched hands Diabetic Foot Exam - Simple   Simple Foot Form Diabetic Foot exam was performed with the following findings: Yes 09/13/2022  8:50 AM  Visual Inspection No deformities, no ulcerations, no other skin breakdown bilaterally: Yes Sensation Testing Intact to touch and monofilament testing bilaterally: Yes Pulse Check Posterior Tibialis and Dorsalis pulse intact bilaterally: Yes Comments Dry skin Decrease sensation in R hallux area     ASSESSMENT: 1. DM2, not insulin-dependent, uncontrolled, with complications - CAD - s/p NSTEMI (Dr. Terrence Dupont) - peripheral neuropathy - CKD   He does not have a family history of medullary thyroid cancer or personal history of pancreatitis.   2. B12 def  3.  Peripheral neuropathy  4. HL  5.  Elevated TSH -In the setting of amiodarone treatment  PLAN:  1. Patient with longstanding, uncontrolled type 2 diabetes,  mood much better controlled after adding Ozempic.  Unfortunately, at last visit, he had to come off Ozempic (he was using the lowest dose) due to price.  We discussed about the possibility of restarting glipizide 2.5 mg twice a day before meals, if the sugars increased after stopping the GLP-1 receptor agonist.  HbA1c at that time was excellent, at 5.9%, decreased from 7.7%. -At today's visit, sugars are at or slightly above target in the morning and he is not checking sugars later in the day, despite specific advised at last visit to do so.  He is off Ozempic but did not use glipizide twice a day as he started to feel low midmorning when taking it in the morning.  He now takes half a tablet in the evening only, but without relationship to the dinner, mostly after dinner.  I advised him to check sugars at different times of the day, since otherwise, it is difficult to make changes in his regimen.  For now, I only advised him to remove the glipizide dose before dinner, but otherwise continue the same regimen. -At today's visit, - I advised him to: Patient Instructions  Please continue: - Metformin 1000 mg 2x a day with meals - Glipizide 2.5 mg before dinner  Please check some sugars later in the day, also.  Please stop at the lab.  Please return in 4 months with your sugar log.  - we checked his HbA1c: 7.7% (higher) - advised to check sugars at different times of the day - 1x a day, rotating check times - advised for yearly eye exams >> he is UTD - return to clinic in 4 months    2.  And 3. Peripheral neuropathy and B12 deficiency -He is peripheral neuropathy probably related to both diabetes and B12 deficiency -He is on 5000 mcg B12 every 3 days; we did discuss in the past about switching to 1000 mcg daily after he ran out of the high-dose tablet but he had 5000 mcg tablets at home so he continues on these. -B12 level was at goal in 09/2021.  4. HL -Reviewed latest lipid panel from 09/2021:  LDL slightly above the goal of less than 55 due to cardiovascular disease, triglycerides elevated: Lab Results  Component Value Date  CHOL 148 10/09/2021   HDL 45.20 10/09/2021   LDLCALC 63 10/09/2021   LDLDIRECT 139.8 07/08/2014   TRIG 196.0 (H) 10/09/2021   CHOLHDL 3 10/09/2021  -He continues on Lipitor 40 mg daily without side effects - will recheck Lipids today - had coffee this am  5.  High TSH -At last visit, he wanted to have his thyroid checked due to the fact that he was on amiodarone. -The TSH was elevated, at 9.93, while free T4 and free T3 levels were normal -He wanted to come off the amiodarone and I advised him to discuss with cardiology >> he is now off -He has no symptoms or signs of hypothyroidism  -We will recheck his TFTs now  Component     Latest Ref Rng 09/13/2022  Vitamin B12     211 - 911 pg/mL >1500 (H)   TSH     0.35 - 5.50 uIU/mL 4.59   Cholesterol     0 - 200 mg/dL 115   Triglycerides     0.0 - 149.0 mg/dL 145.0   HDL Cholesterol     >39.00 mg/dL 38.90 (L)   VLDL     0.0 - 40.0 mg/dL 29.0   LDL (calc)     0 - 99 mg/dL 47   Total CHOL/HDL Ratio 3   NonHDL 75.79   Triiodothyronine,Free,Serum     2.3 - 4.2 pg/mL 3.6   T4,Free(Direct)     0.60 - 1.60 ng/dL 1.17    Thyroid tests are now normal.  LDL at goal.  Vitamin B12 is elevated.  I will advise him to take 1 tablet of B12 once a week.  Philemon Kingdom, MD PhD [facility] Endocrinology

## 2022-09-13 NOTE — Patient Instructions (Addendum)
Please continue: - Metformin 1000 mg 2x a day with meals - Glipizide 2.5 mg before dinner  Please check some sugars later in the day, also.  Please stop at the lab.  Please return in 4 months with your sugar log.

## 2022-09-14 ENCOUNTER — Encounter: Payer: Self-pay | Admitting: Internal Medicine

## 2022-09-17 DIAGNOSIS — R29810 Facial weakness: Secondary | ICD-10-CM | POA: Diagnosis not present

## 2022-09-17 DIAGNOSIS — K1121 Acute sialoadenitis: Secondary | ICD-10-CM | POA: Diagnosis not present

## 2022-09-26 DIAGNOSIS — H35033 Hypertensive retinopathy, bilateral: Secondary | ICD-10-CM | POA: Diagnosis not present

## 2022-09-26 DIAGNOSIS — H34811 Central retinal vein occlusion, right eye, with macular edema: Secondary | ICD-10-CM | POA: Diagnosis not present

## 2022-09-26 DIAGNOSIS — H35373 Puckering of macula, bilateral: Secondary | ICD-10-CM | POA: Diagnosis not present

## 2022-09-26 DIAGNOSIS — H43812 Vitreous degeneration, left eye: Secondary | ICD-10-CM | POA: Diagnosis not present

## 2022-09-26 DIAGNOSIS — E113293 Type 2 diabetes mellitus with mild nonproliferative diabetic retinopathy without macular edema, bilateral: Secondary | ICD-10-CM | POA: Diagnosis not present

## 2022-09-26 DIAGNOSIS — H353133 Nonexudative age-related macular degeneration, bilateral, advanced atrophic without subfoveal involvement: Secondary | ICD-10-CM | POA: Diagnosis not present

## 2022-10-10 ENCOUNTER — Encounter: Payer: Medicare Other | Admitting: Adult Health

## 2022-10-11 ENCOUNTER — Encounter: Payer: Self-pay | Admitting: Adult Health

## 2022-10-11 ENCOUNTER — Ambulatory Visit (INDEPENDENT_AMBULATORY_CARE_PROVIDER_SITE_OTHER): Payer: Medicare Other | Admitting: Adult Health

## 2022-10-11 VITALS — BP 122/84 | HR 70 | Temp 97.9°F | Ht 70.25 in | Wt 255.0 lb

## 2022-10-11 DIAGNOSIS — E1159 Type 2 diabetes mellitus with other circulatory complications: Secondary | ICD-10-CM | POA: Diagnosis not present

## 2022-10-11 DIAGNOSIS — R3912 Poor urinary stream: Secondary | ICD-10-CM | POA: Diagnosis not present

## 2022-10-11 DIAGNOSIS — N401 Enlarged prostate with lower urinary tract symptoms: Secondary | ICD-10-CM | POA: Diagnosis not present

## 2022-10-11 DIAGNOSIS — E559 Vitamin D deficiency, unspecified: Secondary | ICD-10-CM

## 2022-10-11 DIAGNOSIS — I10 Essential (primary) hypertension: Secondary | ICD-10-CM | POA: Diagnosis not present

## 2022-10-11 DIAGNOSIS — E785 Hyperlipidemia, unspecified: Secondary | ICD-10-CM | POA: Diagnosis not present

## 2022-10-11 DIAGNOSIS — E1165 Type 2 diabetes mellitus with hyperglycemia: Secondary | ICD-10-CM | POA: Diagnosis not present

## 2022-10-11 DIAGNOSIS — Z0000 Encounter for general adult medical examination without abnormal findings: Secondary | ICD-10-CM

## 2022-10-11 DIAGNOSIS — I214 Non-ST elevation (NSTEMI) myocardial infarction: Secondary | ICD-10-CM | POA: Diagnosis not present

## 2022-10-11 DIAGNOSIS — I4891 Unspecified atrial fibrillation: Secondary | ICD-10-CM

## 2022-10-11 LAB — CBC WITH DIFFERENTIAL/PLATELET
Basophils Absolute: 0.1 10*3/uL (ref 0.0–0.1)
Basophils Relative: 2 % (ref 0.0–3.0)
Eosinophils Absolute: 0.4 10*3/uL (ref 0.0–0.7)
Eosinophils Relative: 8.3 % — ABNORMAL HIGH (ref 0.0–5.0)
HCT: 41.6 % (ref 39.0–52.0)
Hemoglobin: 13.9 g/dL (ref 13.0–17.0)
Lymphocytes Relative: 21.7 % (ref 12.0–46.0)
Lymphs Abs: 1.1 10*3/uL (ref 0.7–4.0)
MCHC: 33.4 g/dL (ref 30.0–36.0)
MCV: 94.2 fl (ref 78.0–100.0)
Monocytes Absolute: 0.6 10*3/uL (ref 0.1–1.0)
Monocytes Relative: 12.9 % — ABNORMAL HIGH (ref 3.0–12.0)
Neutro Abs: 2.8 10*3/uL (ref 1.4–7.7)
Neutrophils Relative %: 55.1 % (ref 43.0–77.0)
Platelets: 202 10*3/uL (ref 150.0–400.0)
RBC: 4.41 Mil/uL (ref 4.22–5.81)
RDW: 14.9 % (ref 11.5–15.5)
WBC: 5 10*3/uL (ref 4.0–10.5)

## 2022-10-11 LAB — COMPREHENSIVE METABOLIC PANEL
ALT: 27 U/L (ref 0–53)
AST: 26 U/L (ref 0–37)
Albumin: 4.2 g/dL (ref 3.5–5.2)
Alkaline Phosphatase: 64 U/L (ref 39–117)
BUN: 12 mg/dL (ref 6–23)
CO2: 29 mEq/L (ref 19–32)
Calcium: 9 mg/dL (ref 8.4–10.5)
Chloride: 103 mEq/L (ref 96–112)
Creatinine, Ser: 0.96 mg/dL (ref 0.40–1.50)
GFR: 74.11 mL/min (ref >60.00)
Glucose, Bld: 167 mg/dL — ABNORMAL HIGH (ref 70–99)
Potassium: 4.6 mEq/L (ref 3.5–5.1)
Sodium: 143 mEq/L (ref 135–145)
Total Bilirubin: 0.8 mg/dL (ref 0.2–1.2)
Total Protein: 6.7 g/dL (ref 6.0–8.3)

## 2022-10-11 LAB — PSA: PSA: 1.56 ng/mL (ref 0.10–4.00)

## 2022-10-11 LAB — VITAMIN D 25 HYDROXY (VIT D DEFICIENCY, FRACTURES): VITD: 56.21 ng/mL (ref 30.00–100.00)

## 2022-10-11 NOTE — Progress Notes (Signed)
Subjective:    Patient ID: Charles [name], male    DOB: 1941/01/25, 82 y.o.   MRN: [id number]  HPI Patient presents for yearly preventative medicine examination. He is a pleasant 82 year old male who  has a past medical history of Anemia, Arthritis, AVM (arteriovenous malformation) of colon, Cancer of kidney ([place]) (2000), Coronary artery disease, Dysrhythmia, Eczema, GERD (gastroesophageal reflux disease), Hernia, ventral, History of blood transfusion, Hyperlipidemia, Hypertension, Myocardial infarction ([place]), Neuropathy, Obesity, Personal history of adenomatous colonic polyps (07/20/2012), Pneumonia (1942; 1950s X 1), Type II diabetes mellitus ([place]), and Walking pneumonia (2000's X 1).  A-Fib -rate controlled with metoprolol 100 mg daily.  On chronic anticoagulation with Eliquis 5 mg twice daily. He denies chest pain, palpitations, or DOE.   DM -managed by endocrinology.  Currently prescribed glipizide 2.5 mg in the am and  metformin 1000 mg twice daily.  Lab Results  Component Value Date   HGBA1C 7.7 (A) 09/13/2022   Hyperlipidemia/CAD -prescribed Lipitor 40 mg daily.  He denies myalgia or fatigue.  He is seen by cardiology on a routine basis.  He has not experienced chest pain, shortness of breath, or dyspnea on exertion Lab Results  Component Value Date   CHOL 115 09/13/2022   HDL 38.90 (L) 09/13/2022   LDLCALC 47 09/13/2022   LDLDIRECT 139.8 07/08/2014   TRIG 145.0 09/13/2022   CHOLHDL 3 09/13/2022   Hypertension-takes lisinopril 20 mg and Toprol 100 mg daily. BP Readings from Last 3 Encounters:  10/11/22 122/84  09/13/22 112/68  08/12/22 136/80   History of NSTEMI-in 2017.  Had stent placed. No chest pain or shortness of breath  BPH-uses Flomax 0.4 mg.  Symptoms are well controlled  Vitamin D Deficiency- Takes Vitamin D 5000 units daily.    All immunizations and health maintenance protocols were reviewed with the patient and needed orders were placed.  Appropriate  screening laboratory values were ordered for the patient including screening of hyperlipidemia, renal function and hepatic function. If indicated by BPH, a PSA was ordered.  Medication reconciliation,  past medical history, social history, problem list and allergies were reviewed in detail with the patient  Goals were established with regard to weight loss, exercise, and  diet in compliance with medications Wt Readings from Last 3 Encounters:  10/11/22 255 lb (115.7 kg)  09/13/22 247 lb (112 kg)  08/12/22 255 lb 3.2 oz (115.8 kg)    Review of Systems  Constitutional: Negative.   HENT: Negative.    Eyes: Negative.   Respiratory: Negative.    Cardiovascular: Negative.   Gastrointestinal: Negative.   Endocrine: Negative.   Genitourinary: Negative.   Musculoskeletal: Negative.   Skin: Negative.   Allergic/Immunologic: Negative.   Neurological: Negative.   Hematological: Negative.   Psychiatric/Behavioral: Negative.    All other systems reviewed and are negative.  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[place]) 2000   left nephrectomy   Coronary artery disease    Dysrhythmia    PAF   Eczema    GERD (gastroesophageal reflux disease)    Hernia, ventral    History of blood transfusion    Hyperlipidemia    Hypertension    Myocardial infarction ([place])    Neuropathy    Obesity    Personal  history of adenomatous colonic polyps 07/20/2012   3 + adenomas 2009 07/20/2012 2 diminutive polyps     Pneumonia 1942; 1950s X 1   Type II diabetes mellitus ([place])    type II   Walking pneumonia 2000's X 1    Social History   Socioeconomic History   Marital status: Widowed    Spouse name: Not on file   Number of children: 3   Years of education: Not on file   Highest education level: Master's degree (e.g., MA, MS, MEng, MEd, MSW,  MBA)  Occupational History   Occupation: retired  Tobacco Use   Smoking status: Former    Years: 20.00    Types: Cigarettes, Pipe, Cigars    Quit date: 09/09/1968    Years since quitting: 54.1   Smokeless tobacco: Never   Tobacco comments:    smoking cigars- quit in 2006  , quit pipe 2000  Vaping Use   Vaping Use: Never used  Substance and Sexual Activity   Alcohol use: Not Currently    Alcohol/week: 2.0 standard drinks of alcohol    Types: 2 Glasses of wine per week    Comment: 2 glasses on Tuesday   Drug use: No   Sexual activity: Never  Other Topics Concern   Not on file  Social History Narrative   Regular exercise: goes to the [facility] [facility] and walks   Caffeine use: daily; coffee   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     widowed    Three children, One in [place], one in [place] one in [place] - 2 : youngest daughter lives with him   Social Determinants of Health   Financial Resource Strain: Medium Risk (05/28/2022)   Overall Financial Resource Strain (CARDIA)    Difficulty of Paying Living Expenses: Somewhat hard  Food Insecurity: No Food Insecurity (12/26/2021)   Hunger Vital Sign    Worried About Running Out of Food in the Last Year: Never true    Ran Out of Food in the Last Year: Never true  Transportation Needs: No Transportation Needs (12/26/2021)   PRAPARE - Hydrologist (Medical): No    Lack of Transportation (Non-Medical): No  Physical Activity: Sufficiently Active (12/26/2021)   Exercise Vital Sign    Days of Exercise per Week: 5 days    Minutes of Exercise per Session: 140 min  Stress: No Stress Concern Present (12/26/2021)   [place]    Feeling of Stress : Not at all  Social Connections: Moderately Integrated (12/26/2021)   Social Connection and Isolation Panel [NHANES]    Frequency of Communication with Friends and Family: More than three times a  week    Frequency of Social Gatherings with Friends and Family: More than three times a week    Attends Religious Services: More than 4 times per year    Active Member of [employer] or Organizations: Yes    Attends Archivist Meetings: More than 4 times per year    Marital Status: Widowed  Recent Concern: Social Connections - Moderately Isolated (10/11/2021)   Social Connection and Isolation Panel [NHANES]    Frequency of Communication with Friends and Family: More than three times a week    Frequency of Social Gatherings with Friends and Family: More than three times a week    Attends Religious Services: More than 4 times per year    Active Member of [employer]  or Organizations: No    Attends Archivist Meetings: Not on file    Marital Status: Widowed  Intimate Partner Violence: Not At Risk (12/26/2021)   Humiliation, Afraid, Rape, and Kick questionnaire    Fear of Current or Ex-Partner: No    Emotionally Abused: No    Physically Abused: No    Sexually Abused: No    Past Surgical History:  Procedure Laterality Date   A-FLUTTER ABLATION N/A 07/12/2022   Procedure: A-FLUTTER ABLATION;  Surgeon: Mealor, Yetta Barre, MD;  Location: [facility];  Service: Cardiovascular;  Laterality: N/A;   ATRIAL FIBRILLATION ABLATION N/A 07/12/2022   Procedure: ATRIAL FIBRILLATION ABLATION;  Surgeon: Melida Quitter, MD;  Location: [facility];  Service: Cardiovascular;  Laterality: N/A;   CARDIAC CATHETERIZATION N/A 10/19/2015   Procedure: Left Heart Cath and Coronary Angiography;  Surgeon: Charolette Forward, MD;  Location: [facility];  Service: Cardiovascular;  Laterality: N/A;   CARDIAC CATHETERIZATION N/A 10/19/2015   Procedure: Coronary Stent Intervention;  Surgeon: Charolette Forward, MD;  Location: [facility];  Service: Cardiovascular;  Laterality: N/A;   CATARACT EXTRACTION, BILATERAL Bilateral 01/2018   Dr. Kathrin Penner did surgery and he developed MD and is seeing  specialist now for MD treatment   COLONOSCOPY     "nothing showed up this time"   COLONOSCOPY W/ BIOPSIES AND POLYPECTOMY  X 1   CORONARY ANGIOPLASTY     EXCISION MASS NECK Left 01/08/2021   Procedure: EXCISION MASS NECK;  Surgeon: Melida Quitter, MD;  Location: [facility];  Service: ENT;  Laterality: Left;   FEMUR IM NAIL Right 07/26/2013   Procedure: INTRAMEDULLARY (IM) NAIL FEMORAL subtrochanteric;  Surgeon: Mauri Pole, MD;  Location: [facility];  Service: Orthopedics;  Laterality: Right;   FRACTURE SURGERY     INGUINAL HERNIA REPAIR Left >3 times   INGUINAL HERNIA REPAIR Right 2012   NEPHRECTOMY Left 2000   TONSILLECTOMY  1940s    Family History  Problem Relation Age of Onset   Drug abuse Other    Cancer Other    Heart disease Other    Lung disease Other    Diabetes Mother    Diabetes Sister    Colon cancer Neg Hx    Stomach cancer Neg Hx     Allergies  Allergen Reactions   Bee Venom Anaphylaxis    Current Outpatient Medications on File Prior to Visit  Medication Sig Dispense Refill   aspirin 81 MG chewable tablet Chew 81 mg by mouth in the morning.     atorvastatin (LIPITOR) 40 MG tablet Take 1 tablet (40 mg total) by mouth daily at 6 PM. (Patient taking differently: Take 40 mg by mouth daily.) 30 tablet 3   Cholecalciferol (VITAMIN D-3) 125 MCG (5000 UT) TABS Take 5,000 Units by mouth in the morning.     Cyanocobalamin (VITAMIN B-12) 5000 MCG SUBL Place 5,000 mcg under the tongue every 3 (three) days.     cyclobenzaprine (FLEXERIL) 10 MG tablet Take 1 tablet (10 mg total) by mouth at bedtime. 15 tablet 0   ELIQUIS 5 MG TABS tablet Take 5 mg by mouth 2 (two) times daily.     EPINEPHrine 0.3 mg/0.3 mL IJ SOAJ injection Inject 0.3 mg into the muscle as needed for anaphylaxis.     gabapentin (NEURONTIN) 100 MG capsule TAKE 1 CAPSULE BY MOUTH AT  BEDTIME 90 capsule 3   glipiZIDE (GLUCOTROL) 5 MG tablet TAKE 1 TABLET BY  MOUTH  TWICE DAILY BEFORE A MEAL (Patient taking differently:  2.5 mg.) 180 tablet 3   Insulin Pen Needle (PEN NEEDLES) 32G X 4 MM MISC Use as instructed to administer weekly injection. 30 each 1   Insulin Syringes, Disposable, U-100 0.5 ML MISC Use 1x a day 100 each 3   metFORMIN (GLUCOPHAGE) 1000 MG tablet Take 1 tablet (1,000 mg total) by mouth 2 (two) times daily with a meal. 180 tablet 3   metoprolol succinate (TOPROL-XL) 100 MG 24 hr tablet Take 100 mg by mouth in the morning and at bedtime.     Multiple Vitamins-Minerals (PRESERVISION AREDS 2 PO) Take 1 tablet by mouth in the morning and at bedtime.     nitroGLYCERIN (NITROSTAT) 0.4 MG SL tablet Place 1 tablet (0.4 mg total) under the tongue every 5 (five) minutes as needed for chest pain. 25 tablet 1   ONETOUCH DELICA LANCETS FINE MISC Use to check sugar daily 100 each 5   ONETOUCH VERIO test strip USE 1 STRIP TO CHECK GLUCOSE ONCE DAILY 100 each 3   tamsulosin (FLOMAX) 0.4 MG CAPS capsule TAKE 1 CAPSULE BY MOUTH DAILY 100 capsule 2   No current facility-administered medications on file prior to visit.    BP 122/84   Pulse 70   Temp 97.9 F (36.6 C) (Oral)   Ht 5' 10.25" (1.784 m)   Wt 255 lb (115.7 kg)   SpO2 97%   BMI 36.33 kg/m       Objective:   Physical Exam Vitals and nursing note reviewed.  Constitutional:      General: He is not in acute distress.    Appearance: Normal appearance. He is obese. He is not ill-appearing or toxic-appearing.  HENT:     Head: Normocephalic and atraumatic.     Right Ear: Tympanic membrane, ear canal and external ear normal. There is no impacted cerumen.     Left Ear: Tympanic membrane, ear canal and external ear normal. There is no impacted cerumen.     Nose: Nose normal. No congestion or rhinorrhea.     Mouth/Throat:     Mouth: Mucous membranes are moist.     Pharynx: Oropharynx is clear. No oropharyngeal exudate or posterior oropharyngeal erythema.  Eyes:     Extraocular Movements: Extraocular movements intact.     Conjunctiva/sclera:  Conjunctivae normal.     Pupils: Pupils are equal, round, and reactive to light.  Neck:     Vascular: No carotid bruit.  Cardiovascular:     Rate and Rhythm: Normal rate and regular rhythm.     Pulses: Normal pulses.     Heart sounds: Normal heart sounds. No murmur heard.    No friction rub. No gallop.  Pulmonary:     Effort: Pulmonary effort is normal.     Breath sounds: Normal breath sounds.  Abdominal:     General: Abdomen is flat. Bowel sounds are normal. There is no distension.     Palpations: Abdomen is soft. There is no mass.     Tenderness: There is no abdominal tenderness. There is no guarding or rebound.     Hernia: No hernia is present.  Musculoskeletal:        General: Normal range of motion.     Cervical back: Normal range of motion and neck supple.  Lymphadenopathy:     Cervical: No cervical adenopathy.  Skin:    General: Skin is warm and dry.     Capillary Refill: Capillary refill takes  less than 2 seconds.  Neurological:     General: No focal deficit present.     Mental Status: He is alert and oriented to person, place, and time.  Psychiatric:        Mood and Affect: Mood normal.        Behavior: Behavior normal.        Thought Content: Thought content normal.        Judgment: Judgment normal.       Assessment & Plan:  1. Routine general medical examination at a health care facility Today patient counseled on age appropriate routine health concerns for screening and prevention, each reviewed and up to date or declined. Immunizations reviewed and up to date or declined. Labs ordered and reviewed. Risk factors for depression reviewed and negative. Hearing function and visual acuity are intact. ADLs screened and addressed as needed. Functional ability and level of safety reviewed and appropriate. Education, counseling and referrals performed based on assessed risks today. Patient provided with a copy of personalized plan for preventive services. - Advised tdap at  local pharmacy   2. Essential hypertension - Well controlled. No change in medications  - CBC with Differential/Platelet; Future - Comprehensive metabolic panel; Future - Comprehensive metabolic panel - CBC with Differential/Platelet  3. Poorly controlled type 2 diabetes mellitus with circulatory disorder [facility]) - Per endocrinology  - Encouraged lifestyle modifications  - CBC with Differential/Platelet; Future - Comprehensive metabolic panel; Future - Comprehensive metabolic panel - CBC with Differential/Platelet  4. Atrial fibrillation, unspecified type [facility]) - Per cardiology  - CBC with Differential/Platelet; Future - Comprehensive metabolic panel; Future - Comprehensive metabolic panel - CBC with Differential/Platelet   5. Hyperlipidemia, unspecified hyperlipidemia type - lipid panel drawn last week by endcrinology  - No change in medication - CBC with Differential/Platelet; Future - Comprehensive metabolic panel; Future - Comprehensive metabolic panel - CBC with Differential/Platelet  6. Benign prostatic hyperplasia with weak urinary stream - Continue flomax 0.4 mg  - PSA; Future - PSA  7. NSTEMI (non-ST elevated myocardial infarction) [facility]) - Per cardiology  - CBC with Differential/Platelet; Future - Comprehensive metabolic panel; Future - Comprehensive metabolic panel - CBC with Differential/Platelet  8. Vitamin D deficiency  - CBC with Differential/Platelet; Future - Comprehensive metabolic panel; Future - VITAMIN D 25 Hydroxy (Vit-D Deficiency, Fractures); Future - VITAMIN D 25 Hydroxy (Vit-D Deficiency, Fractures) - Comprehensive metabolic panel - CBC with Differential/Platelet  [name], NP

## 2022-11-21 DIAGNOSIS — H34811 Central retinal vein occlusion, right eye, with macular edema: Secondary | ICD-10-CM | POA: Diagnosis not present

## 2022-11-21 DIAGNOSIS — E113293 Type 2 diabetes mellitus with mild nonproliferative diabetic retinopathy without macular edema, bilateral: Secondary | ICD-10-CM | POA: Diagnosis not present

## 2022-11-21 DIAGNOSIS — H353133 Nonexudative age-related macular degeneration, bilateral, advanced atrophic without subfoveal involvement: Secondary | ICD-10-CM | POA: Diagnosis not present

## 2022-11-21 DIAGNOSIS — H35033 Hypertensive retinopathy, bilateral: Secondary | ICD-10-CM | POA: Diagnosis not present

## 2022-11-21 DIAGNOSIS — H43812 Vitreous degeneration, left eye: Secondary | ICD-10-CM | POA: Diagnosis not present

## 2022-11-21 DIAGNOSIS — H35373 Puckering of macula, bilateral: Secondary | ICD-10-CM | POA: Diagnosis not present

## 2022-11-21 NOTE — Progress Notes (Signed)
Care Management & Coordination Services Pharmacy Note  11/26/2022 Name:  Charles [name] MRN:  [id number] DOB:  [birth year]  Summary: A1C not at goal <7 Denies any signs of bleed with Eliquis   Recommendations/Changes made from today's visit: -INCREASE Glipizide to 5mg  full tab daily with dinner (rx already allows for that) -Counseled on low carb diet and exercise -Counseled on signs of bleed and to notify office if any falls  Follow up plan: DM call in 3 weeks and 3 months Pharmacist visit in 4 months   Subjective: Charles [name] is an 82 y.o. year old male who is a primary patient of [name], NP.  The care coordination team was consulted for assistance with disease management and care coordination needs.    Engaged with patient by telephone for follow up visit.  Recent office visits: 10/11/22 [name], NP - For routine exam, STOP levothyroxine, ozempic  07/31/2022 [name] NP - Patient was seen for Acute right-sided low back pain without sciatica. Started Flexeril 10 mg at bedtime. No follow up noted.    07/16/2022 Alysia Penna MD - Patient was seen for parotid mass. No medication changes. No follow up noted.  Recent consult visits: 09/17/22 Melida Quitter, MD (ENT) - For parotitis, no med changes  09/13/2022 Philemon Kingdom, MD (Internal Med) - For T2DM, no med changes  08/12/2022 Doralee Albino MD (cardiology) - Patient was seen for Atrial fibrillation, unspecified type. No medication changes. Follow up in 1 year.    08/07/2022 Melida Quitter MD (ENT) - Patient was seen for acute parotitis. No medication changes. Follow up in 1 month.    07/24/2022 Melida Quitter MD (ENT) - Patient was seen for acute parotitis. Started prednisone taper. Follow up in 2 weeks.   07/17/2022  Melida Quitter MD (ENT) - Patient was seen for Warthin's tumor and Acute parotitis. Started Clindamycin. Follow up in 1 week.    06/24/2022 Doralee Albino MD (cardiology) - Patient was seen for atrial  fibrillation. No medication changes. No follow up noted.   Hospital visits: [facility]/12/22 [facility], LOS 9 hours, scheduled admit for Afib ablation    Objective:  Lab Results  Component Value Date   CREATININE 0.96 10/11/2022   BUN 12 10/11/2022   GFR 74.11 10/11/2022   EGFR 52 (L) 06/28/2022   GFRNONAA >60 01/08/2021   GFRAA 53 (L) 09/29/2018   NA 143 10/11/2022   K 4.6 10/11/2022   CALCIUM 9.0 10/11/2022   CO2 29 10/11/2022   GLUCOSE 167 (H) 10/11/2022    Lab Results  Component Value Date/Time   HGBA1C 7.7 (A) 09/13/2022 08:48 AM   HGBA1C 5.9 (A) 05/08/2022 08:43 AM   HGBA1C 7.9 (H) 09/29/2018 02:23 AM   HGBA1C 6.9 (H) 08/08/2016 08:56 AM   GFR 74.11 10/11/2022 09:37 AM   GFR 55.98 (L) 10/09/2021 10:16 AM   MICROALBUR 27.5 (H) 07/16/2022 03:28 PM   MICROALBUR 13.8 (H) 08/08/2016 08:56 AM    Last diabetic Eye exam:  Lab Results  Component Value Date/Time   HMDIABEYEEXA No Retinopathy 02/06/2022 12:00 AM    Last diabetic Foot exam:  Lab Results  Component Value Date/Time   HMDIABFOOTEX yes 11/24/2009 12:00 AM     Lab Results  Component Value Date   CHOL 115 09/13/2022   HDL 38.90 (L) 09/13/2022   LDLCALC 47 09/13/2022   LDLDIRECT 139.8 07/08/2014   TRIG 145.0 09/13/2022   CHOLHDL 3 09/13/2022       Latest Ref Rng & Units  10/11/2022    9:37 AM 10/09/2021   10:16 AM 10/25/2020   10:19 AM  Hepatic Function  Total Protein 6.0 - 8.3 g/dL 6.7  7.3  6.5   Albumin 3.5 - 5.2 g/dL 4.2  4.3  4.1   AST 0 - 37 U/L 26  24  23    ALT 0 - 53 U/L 27  25  21    Alk Phosphatase 39 - 117 U/L 64  68  75   Total Bilirubin 0.2 - 1.2 mg/dL 0.8  0.7  0.6     Lab Results  Component Value Date/Time   TSH 4.59 09/13/2022 09:01 AM   TSH 9.93 (H) 05/08/2022 08:45 AM   FREET4 1.17 09/13/2022 09:01 AM   FREET4 0.75 05/08/2022 12:47 PM       Latest Ref Rng & Units 10/11/2022    9:37 AM 07/16/2022    3:19 PM 06/24/2022   12:24 PM  CBC  WBC 4.0 - 10.5 K/uL 5.0  5.0  5.8    Hemoglobin 13.0 - 17.0 g/dL 13.9  12.6  14.3   Hematocrit 39.0 - 52.0 % 41.6  38.6  43.1   Platelets 150.0 - 400.0 K/uL 202.0  199.0  197     Lab Results  Component Value Date/Time   VD25OH 56.21 10/11/2022 09:37 AM   VD25OH 61.75 10/09/2021 10:16 AM   VITAMINB12 >1500 (H) 09/13/2022 09:01 AM   VITAMINB12 832 10/09/2021 10:16 AM    Clinical ASCVD: Yes  The ASCVD Risk score (Arnett DK, et al., 2019) failed to calculate for the following reasons:   The 2019 ASCVD risk score is only valid for ages 77 to 69   The patient has a prior MI or stroke diagnosis       12/26/2021    9:00 AM 10/09/2021    9:38 AM 12/20/2020    9:05 AM  Depression screen PHQ 2/9  Decreased Interest 0 0 0  Down, Depressed, Hopeless 0 0 0  PHQ - 2 Score 0 0 0     Social History   Tobacco Use  Smoking Status Former   Years: 20   Types: Cigarettes, Pipe, Cigars   Quit date: 09/09/1968   Years since quitting: 54.2  Smokeless Tobacco Never  Tobacco Comments   smoking cigars- quit in 2006  , quit pipe 2000   BP Readings from Last 3 Encounters:  10/11/22 122/84  09/13/22 112/68  08/12/22 136/80   Pulse Readings from Last 3 Encounters:  10/11/22 70  09/13/22 68  08/12/22 87   Wt Readings from Last 3 Encounters:  10/11/22 255 lb (115.7 kg)  09/13/22 247 lb (112 kg)  08/12/22 255 lb 3.2 oz (115.8 kg)   BMI Readings from Last 3 Encounters:  10/11/22 36.33 kg/m  09/13/22 34.45 kg/m  08/12/22 35.59 kg/m    Allergies  Allergen Reactions   Bee Venom Anaphylaxis    Medications Reviewed Today     Reviewed by [name], NP (Nurse Practitioner) on 10/11/22 at 54  Med List Status: <None>   Medication Order Taking? Sig Documenting Provider Last Dose Status Informant  aspirin 81 MG chewable tablet [id number] Yes Chew 81 mg by mouth in the morning. [provider] Taking Active Self  atorvastatin (LIPITOR) 40 MG tablet [id number] Yes Take 1 tablet (40 mg total) by mouth daily at 6 PM.   Patient taking differently: Take 40 mg by mouth daily.   Dixie Dials, MD Taking Active Self  Cholecalciferol (VITAMIN D-3) 125  MCG (5000 UT) TABS [id number] Yes Take 5,000 Units by mouth in the morning. [provider] Taking Active Self  Cyanocobalamin (VITAMIN B-12) 5000 MCG SUBL [id number] Yes Place 5,000 mcg under the tongue every 3 (three) days. [provider] Taking Active Self  cyclobenzaprine (FLEXERIL) 10 MG tablet [id number] Yes Take 1 tablet (10 mg total) by mouth at bedtime. Nafziger, Tommi Rumps, NP Taking Active   ELIQUIS 5 MG TABS tablet [id number] Yes Take 5 mg by mouth 2 (two) times daily. [provider] Taking Active Self  EPINEPHrine 0.3 mg/0.3 mL IJ SOAJ injection [id number] Yes Inject 0.3 mg into the muscle as needed for anaphylaxis. [provider] Taking Active Self           Med Note Ala Dach   Fri Jul 12, 2022  6:06 AM) Used over 10 years ago.  gabapentin (NEURONTIN) 100 MG capsule [id number] Yes TAKE 1 CAPSULE BY MOUTH AT  BEDTIME Lyndal Pulley, DO Taking Active Self  glipiZIDE (GLUCOTROL) 5 MG tablet [id number] Yes TAKE 1 TABLET BY MOUTH  TWICE DAILY BEFORE A MEAL  Patient taking differently: 2.5 mg.   Philemon Kingdom, MD Taking Active Self           Med Note Nat Christen   Wed Jul 10, 2022 10:43 AM) Holding until after the procedure  Insulin Pen Needle (PEN NEEDLES) 32G X 4 MM MISC [id number] Yes Use as instructed to administer weekly injection. Philemon Kingdom, MD Taking Active Self  Insulin Syringes, Disposable, U-100 0.5 ML MISC [id number] Yes Use 1x a day Philemon Kingdom, MD Taking Active Self  metFORMIN (GLUCOPHAGE) 1000 MG tablet [id number] Yes Take 1 tablet (1,000 mg total) by mouth 2 (two) times daily with a meal. Philemon Kingdom, MD Taking Active Self           Med Note Nat Christen   Wed Jul 10, 2022 10:43 AM) Holding until after the procedure  metoprolol succinate (TOPROL-XL) 100 MG 24 hr tablet [id number]  Yes Take 100 mg by mouth in the morning and at bedtime. [provider] Taking Active Self           Med Note Kenton Kingfisher, Germaine Pomfret Dec 27, 2020  8:38 AM)    Multiple Vitamins-Minerals (PRESERVISION AREDS 2 PO) [id number] Yes Take 1 tablet by mouth in the morning and at bedtime. [provider] Taking Active Self  nitroGLYCERIN (NITROSTAT) 0.4 MG SL tablet [id number] Yes Place 1 tablet (0.4 mg total) under the tongue every 5 (five) minutes as needed for chest pain. Dixie Dials, MD Taking Active Self           Med Note Ala Dach   Fri Jul 12, 2022  6:07 AM) Never used as of 07/12/22.  [facility] DELICA LANCETS FINE MISC [id number] Yes Use to check sugar daily Philemon Kingdom, MD Taking Active Self  Roma Schanz test strip [id number] Yes USE 1 STRIP TO CHECK GLUCOSE ONCE DAILY Philemon Kingdom, MD Taking Active Self  tamsulosin (FLOMAX) 0.4 MG CAPS capsule [id number] Yes TAKE 1 CAPSULE BY MOUTH DAILY Nafziger, Tommi Rumps, NP Taking Active             SDOH:  (Social Determinants of Health) assessments and interventions performed: Yes SDOH Interventions    [place] Coordination from 11/26/2022 in [place] Management from 05/28/2022 in [place] at [place] from 12/26/2021 in [place] at [place] Management  from 10/18/2020 in [place] at [place] Patient Outreach Telephone from 08/25/2020 in [place] Management from 05/17/2020 in [place] at [place] Interventions Intervention Not Indicated -- Intervention Not Indicated Intervention Not Indicated Intervention Not Indicated --  Housing Interventions Intervention Not Indicated -- Intervention Not Indicated Intervention Not Indicated -- --  Transportation Interventions -- --  Intervention Not Indicated Intervention Not Indicated Intervention Not Indicated Intervention Not Indicated  Financial Strain Interventions -- Other (Comment)  [provided savings program information] Intervention Not Indicated Intervention Not Indicated -- Intervention Not Indicated  Physical Activity Interventions -- -- Intervention Not Indicated Intervention Not Indicated -- --  Stress Interventions -- -- Intervention Not Indicated Intervention Not Indicated -- --  Social Connections Interventions -- -- Intervention Not Indicated Intervention Not Indicated -- --       Medication Assistance: None required.  Patient affirms current coverage meets needs.  Medication Access: Within the past 30 days, how often has patient missed a dose of medication? Couple times a month, gets busy in the evenings Is a pillbox or other method used to improve adherence? Yes  Factors that may affect medication adherence? nonadherence to medications Are meds synced by current pharmacy? No  Are meds delivered by current pharmacy? Yes  Does patient experience delays in picking up medications due to transportation concerns? No   [facility] Services Reviewed: Is patient disadvantaged to use [facility]?: Yes  Current Rx insurance plan: [facility] Name and location of Current pharmacy:  [facility] ([place], [place] [address] [place] [address] [place] [address] [place] [address] Phone: [phone] Fax: [place] ([place]), [place] [id number] [place] ([place]) [place] [address] Phone: [phone] Fax: [phone]  [place], [place] [address] Phone: [phone] Fax: [phone]  [facility] services reviewed with patient today?: No  Patient requests to transfer care to [facility] Pharm[facility]?: No  Reason patient declined to  change pharmacies: Disadvantaged due to insurance/mail order  Compliance/Adherence/Medication fill history: Care Gaps: AWV Due 12/2022 Vaccines: Tdap, COVID  Star-Rating Drugs: Atorvastatin 40mg  PDC 100% Glipizide 5mg ?? Metformin 1000mg  PDC 91%   Assessment/Plan   Diabetes (A1c goal <7%) -Uncontrolled -Current medications: Glipizide 5mg  1/2 tab in the evening Appropriate, Query Effective Metformin 1000mg  BID Appropriate, Effective, Safe, Accessible -Medications previously tried: Ozempic  -Current home glucose readings - once daily fasting glucose: denies any lows less than 70. 110 this morning. 155-160 some days -Denies hypoglycemic/hyperglycemic symptoms -Current meal patterns:  breakfast: bowl of cereal ([employer]-banana), toast w/ cheese or jam/jelly  lunch: varies  dinner: meatloaf, spaghetti snacks: No drinks: glass of juice, 2-3 cups of coffee, water -Current exercise: goes to the [employer] Mon-Fri and walks -Educated on A1c and blood sugar goals; Complications of diabetes including kidney damage, retinal damage, and cardiovascular disease; Prevention and management of hypoglycemic episodes; Benefits of routine self-monitoring of blood sugar; -Counseled to check feet daily and get yearly eye exams -Recommended to increase glipizide back to full tab in evening, with dinner  Atrial Fibrillation (Goal: prevent stroke and major bleeding) -Controlled -CHADSVASC: 5 -Current treatment: Rate control: Metoprolol XL 100mg  Appropriate, Effective, Safe, Accessible Anticoagulation: Eliquis 5mg  BID Appropriate, Effective, Safe, Accessible -Medications previously tried: Amiodarone -Home  BP and HR readings: WNL  -Counseled on increased risk of stroke due to Afib and benefits of anticoagulation for stroke prevention; importance of adherence to anticoagulant exactly as prescribed; bleeding risk associated with Eliquis and importance of self-monitoring for signs/symptoms of  bleeding; avoidance of NSAIDs due to increased bleeding risk with anticoagulants; seeking medical attention after a head injury or if there is blood in the urine/stool; -Recommended to continue current medication  [place] Pharmacist [phone]

## 2022-11-26 ENCOUNTER — Ambulatory Visit: Payer: Medicare Other

## 2022-11-26 ENCOUNTER — Telehealth: Payer: Self-pay

## 2022-11-26 NOTE — Progress Notes (Signed)
Patient ID: Charles [name], male   DOB: [birth year], 82 y.o.   MRN: [id number] Care Management & Coordination Services Pharmacy Team  Reason for Encounter: Appointment Reminder  Contacted patient to confirm telephone appointment with Theo Dills, PharmD on 11/26/22 at 8:30. Unsuccessful outreach. Left voicemail for patient to return call.  Star Rating Drugs:  Atorvastatin 40 mg - last filled 11/14/22 [facility] Glipizide 5 mg - last filled 05/09/2022 [facility] at [facility] Metformin 1000 mg - last filled 09/23/22 100 DS at [place]: [place] - 4/198/23   [place] Clinical Pharmacist Assistant [phone]

## 2022-11-28 ENCOUNTER — Other Ambulatory Visit: Payer: Self-pay | Admitting: Internal Medicine

## 2022-12-26 ENCOUNTER — Telehealth: Payer: Self-pay

## 2022-12-26 NOTE — Progress Notes (Signed)
Care Management & Coordination Services Pharmacy Team  Reason for Encounter: Diabetes  Contacted patient to discuss diabetes disease state. Spoke with patient on 12/26/2022   Current antihyperglycemic regimen:  Glipizide 5 mg once daily (per Dr. Elvera Lennox) Metformin 1000 mg twice daily  Patient verbally confirms he is taking the above medications as directed. Yes  What diet changes have been made to improve diabetes control? No specific way of eating, he eats what he feels like  What recent interventions/DTPs have been made to improve glycemic control:  No recent interventions  Have there been any recent hospitalizations or ED visits since last visit with PharmD? No recent hospital visits  Patient denies hypoglycemic symptoms, including None  Patient denies hyperglycemic symptoms, including none  How often are you checking your blood sugar? once daily before breakfast  What are your blood sugars ranging?  Fasting: his blood sugars are between 110-170 depending on what he eats  During the week, how often does your blood glucose drop below 70? Patient denies  Are you checking your feet daily/regularly? Yes patient is checking daily  Adherence Review: Is the patient currently on a STATIN medication? Yes Is the patient currently on ACE/ARB medication? No Does the patient have >5 day gap between last estimated fill dates? No  Care Gaps: AWV - scheduled 12/30/2022 Last eye exam - 02/06/2022 Last foot exam - 09/13/2022 Last BP - 122/84 on 10/11/2022 Last A1C - 7.7 on 09/13/2022 Tdap - overdue Covid - overdue  Star Rating Drugs: Atorvastatin 40 mg - last filled 11/14/2022 [facility] Glipizide 5 mg - last filled 05/09/2022 100 DS at [facility] (patient is taking this once daily) Metformin 1000 mg - last filled 09/23/2022 100 DS at [facility]  Chart Updates:  Recent office visits:  None  Recent consult visits:  None  Hospital visits:  None  Medications: Outpatient  Encounter Medications as of 12/26/2022  Medication Sig Note   aspirin 81 MG chewable tablet Chew 81 mg by mouth in the morning.    atorvastatin (LIPITOR) 40 MG tablet Take 1 tablet (40 mg total) by mouth daily at 6 PM. (Patient taking differently: Take 40 mg by mouth daily.)    Cholecalciferol (VITAMIN D-3) 125 MCG (5000 UT) TABS Take 5,000 Units by mouth in the morning.    Cyanocobalamin (VITAMIN B-12) 5000 MCG SUBL Place 5,000 mcg under the tongue every 3 (three) days.    cyclobenzaprine (FLEXERIL) 10 MG tablet Take 1 tablet (10 mg total) by mouth at bedtime.    ELIQUIS 5 MG TABS tablet Take 5 mg by mouth 2 (two) times daily.    EPINEPHrine 0.3 mg/0.3 mL IJ SOAJ injection Inject 0.3 mg into the muscle as needed for anaphylaxis. 07/12/2022: Used over 10 years ago.   gabapentin (NEURONTIN) 100 MG capsule TAKE 1 CAPSULE BY MOUTH AT  BEDTIME    glipiZIDE (GLUCOTROL) 5 MG tablet TAKE 1 TABLET BY MOUTH  TWICE DAILY BEFORE A MEAL (Patient taking differently: 2.5 mg.) 07/10/2022: Holding until after the procedure   Insulin Pen Needle (PEN NEEDLES) 32G X 4 MM MISC Use as instructed to administer weekly injection.    Insulin Syringes, Disposable, U-100 0.5 ML MISC Use 1x a day    metFORMIN (GLUCOPHAGE) 1000 MG tablet TAKE 1 TABLET BY MOUTH TWICE  DAILY WITH MEALS    metoprolol succinate (TOPROL-XL) 100 MG 24 hr tablet Take 100 mg by mouth in the morning and at bedtime.    Multiple Vitamins-Minerals (PRESERVISION AREDS 2  PO) Take 1 tablet by mouth in the morning and at bedtime.    nitroGLYCERIN (NITROSTAT) 0.4 MG SL tablet Place 1 tablet (0.4 mg total) under the tongue every 5 (five) minutes as needed for chest pain. 07/12/2022: Never used as of 07/12/22.   ONETOUCH DELICA LANCETS FINE MISC Use to check sugar daily    ONETOUCH VERIO test strip USE 1 STRIP TO CHECK GLUCOSE ONCE DAILY    tamsulosin (FLOMAX) 0.4 MG CAPS capsule TAKE 1 CAPSULE BY MOUTH DAILY    No facility-administered encounter medications on  file as of 12/26/2022.  Fill History:   Dispensed Days Supply Quantity Provider Pharmacy  TAMSULOSIN HYDROCHLORIDE  0.4 MG CAPS 10/25/2022 100 100 capsu      Dispensed Days Supply Quantity Provider Pharmacy  METOPROLOL SUCCINATE ER  100 MG TB24 11/26/2022 100 150 tablet       Dispensed Days Supply Quantity Provider Pharmacy  METFORMIN HYDROCHLORIDE  1000 MG TABS 09/23/2022 100 200 tablet      Dispensed Days Supply Quantity Provider Pharmacy  GLIPIZIDE  5 MG TABS 05/09/2022 100 200 tablet      Dispensed Days Supply Quantity Provider Pharmacy  GABAPENTIN  100 MG CAPS 05/09/2022 100 100 capsule      Dispensed Days Supply Quantity Provider Pharmacy  ELIQUIS  5 MG TABS 09/23/2022 100 200 tablet      Dispensed Days Supply Quantity Provider Pharmacy  ATORVASTATIN CALCIUM  40 MG TABS 11/14/2022 100 100 tablet      Recent Relevant Labs: Lab Results  Component Value Date/Time   HGBA1C 7.7 (A) 09/13/2022 08:48 AM   HGBA1C 5.9 (A) 05/08/2022 08:43 AM   HGBA1C 7.9 (H) 09/29/2018 02:23 AM   HGBA1C 6.9 (H) 08/08/2016 08:56 AM   MICROALBUR 27.5 (H) 07/16/2022 03:28 PM   MICROALBUR 13.8 (H) 08/08/2016 08:56 AM    Kidney Function Lab Results  Component Value Date/Time   CREATININE 0.96 10/11/2022 09:37 AM   CREATININE 1.37 (H) 06/28/2022 11:05 AM   CREATININE 1.13 04/06/2021 03:50 PM   CREATININE 1.21 (H) 02/18/2017 08:30 AM   GFR 74.11 10/11/2022 09:37 AM   GFRNONAA >60 01/08/2021 08:00 AM   GFRNONAA 58 (L) 02/18/2017 08:30 AM   GFRAA 53 (L) 09/29/2018 10:14 AM   GFRAA 67 02/18/2017 08:30 AM    .jj

## 2023-01-01 ENCOUNTER — Ambulatory Visit (INDEPENDENT_AMBULATORY_CARE_PROVIDER_SITE_OTHER): Payer: Medicare Other

## 2023-01-01 VITALS — Ht 70.25 in | Wt 240.0 lb

## 2023-01-01 DIAGNOSIS — Z0000 Encounter for general adult medical examination without abnormal findings: Secondary | ICD-10-CM | POA: Diagnosis not present

## 2023-01-01 NOTE — Patient Instructions (Addendum)
Charles [name] , Thank you for taking time to come for your Medicare Wellness Visit. I appreciate your ongoing commitment to your health goals. Please review the following plan we discussed and let me know if I can assist you in the future.   These are the goals we discussed:  Goals       Patient Stated      Stay around for a while       Stay Healthy (pt-stated)      Stay alive!      Weight (lb) < 200 lb (90.7 kg) (pt-stated)      Weight off.      Weight (lb) < 230 lb (104.3 kg)      Watch your portion sizes         This is a list of the screening recommended for you and due dates:  Health Maintenance  Topic Date Due   DTaP/Tdap/Td vaccine (3 - Td or Tdap) 02/19/2022   COVID-19 Vaccine (4 - 2023-24 season) 01/17/2023*   Eye exam for diabetics  02/07/2023   Hemoglobin A1C  03/14/2023   Flu Shot  04/10/2023   Yearly kidney health urinalysis for diabetes  07/17/2023   Complete foot exam   09/14/2023   Yearly kidney function blood test for diabetes  10/12/2023   Medicare Annual Wellness Visit  01/01/2024   Pneumonia Vaccine  Completed   Zoster (Shingles) Vaccine  Completed   HPV Vaccine  Aged Out   Colon Cancer Screening  Discontinued  *Topic was postponed. The date shown is not the original due date.    Advanced directives: Please bring a copy of your health care power of attorney and living will to the office to be added to your chart at your convenience.   Conditions/risks identified: None  Next appointment: Follow up in one year for your annual wellness visit.   Preventive Care 19 Years and Older, Male  Preventive care refers to lifestyle choices and visits with your health care provider that can promote health and wellness. What does preventive care include? A yearly physical exam. This is also called an annual well check. Dental exams once or twice a year. Routine eye exams. Ask your health care provider how often you should have your eyes checked. Personal lifestyle  choices, including: Daily care of your teeth and gums. Regular physical activity. Eating a healthy diet. Avoiding tobacco and drug use. Limiting alcohol use. Practicing safe sex. Taking low doses of aspirin every day. Taking vitamin and mineral supplements as recommended by your health care provider. What happens during an annual well check? The services and screenings done by your health care provider during your annual well check will depend on your age, overall health, lifestyle risk factors, and family history of disease. Counseling  Your health care provider may ask you questions about your: Alcohol use. Tobacco use. Drug use. Emotional well-being. Home and relationship well-being. Sexual activity. Eating habits. History of falls. Memory and ability to understand (cognition). Work and work Astronomer. Screening  You may have the following tests or measurements: Height, weight, and BMI. Blood pressure. Lipid and cholesterol levels. These may be checked every 5 years, or more frequently if you are over 58 years old. Skin check. Lung cancer screening. You may have this screening every year starting at age 76 if you have a 30-pack-year history of smoking and currently smoke or have quit within the past 15 years. Fecal occult blood test (FOBT) of the stool. You may have  this test every year starting at age 71. Flexible sigmoidoscopy or colonoscopy. You may have a sigmoidoscopy every 5 years or a colonoscopy every 10 years starting at age 25. Prostate cancer screening. Recommendations will vary depending on your family history and other risks. Hepatitis C blood test. Hepatitis B blood test. Sexually transmitted disease (STD) testing. Diabetes screening. This is done by checking your blood sugar (glucose) after you have not eaten for a while (fasting). You may have this done every 1-3 years. Abdominal aortic aneurysm (AAA) screening. You may need this if you are a current or  former smoker. Osteoporosis. You may be screened starting at age 63 if you are at high risk. Talk with your health care provider about your test results, treatment options, and if necessary, the need for more tests. Vaccines  Your health care provider may recommend certain vaccines, such as: Influenza vaccine. This is recommended every year. Tetanus, diphtheria, and acellular pertussis (Tdap, Td) vaccine. You may need a Td booster every 10 years. Zoster vaccine. You may need this after age 37. Pneumococcal 13-valent conjugate (PCV13) vaccine. One dose is recommended after age 56. Pneumococcal polysaccharide (PPSV23) vaccine. One dose is recommended after age 44. Talk to your health care provider about which screenings and vaccines you need and how often you need them. This information is not intended to replace advice given to you by your health care provider. Make sure you discuss any questions you have with your health care provider. Document Released: 09/22/2015 Document Revised: 05/15/2016 Document Reviewed: 06/27/2015 Elsevier Interactive Patient Education  2017 [employer].  Fall Prevention in the Home Falls can cause injuries. They can happen to people of all ages. There are many things you can do to make your home safe and to help prevent falls. What can I do on the outside of my home? Regularly fix the edges of walkways and driveways and fix any cracks. Remove anything that might make you trip as you walk through a door, such as a raised step or threshold. Trim any bushes or trees on the path to your home. Use bright outdoor lighting. Clear any walking paths of anything that might make someone trip, such as rocks or tools. Regularly check to see if handrails are loose or broken. Make sure that both sides of any steps have handrails. Any raised decks and porches should have guardrails on the edges. Have any leaves, snow, or ice cleared regularly. Use sand or salt on walking paths  during winter. Clean up any spills in your garage right away. This includes oil or grease spills. What can I do in the bathroom? Use night lights. Install grab bars by the toilet and in the tub and shower. Do not use towel bars as grab bars. Use non-skid mats or decals in the tub or shower. If you need to sit down in the shower, use a plastic, non-slip stool. Keep the floor dry. Clean up any water that spills on the floor as soon as it happens. Remove soap buildup in the tub or shower regularly. Attach bath mats securely with double-sided non-slip rug tape. Do not have throw rugs and other things on the floor that can make you trip. What can I do in the bedroom? Use night lights. Make sure that you have a light by your bed that is easy to reach. Do not use any sheets or blankets that are too big for your bed. They should not hang down onto the floor. Have a firm chair  that has side arms. You can use this for support while you get dressed. Do not have throw rugs and other things on the floor that can make you trip. What can I do in the kitchen? Clean up any spills right away. Avoid walking on wet floors. Keep items that you use a lot in easy-to-reach places. If you need to reach something above you, use a strong step stool that has a grab bar. Keep electrical cords out of the way. Do not use floor polish or wax that makes floors slippery. If you must use wax, use non-skid floor wax. Do not have throw rugs and other things on the floor that can make you trip. What can I do with my stairs? Do not leave any items on the stairs. Make sure that there are handrails on both sides of the stairs and use them. Fix handrails that are broken or loose. Make sure that handrails are as long as the stairways. Check any carpeting to make sure that it is firmly attached to the stairs. Fix any carpet that is loose or worn. Avoid having throw rugs at the top or bottom of the stairs. If you do have throw  rugs, attach them to the floor with carpet tape. Make sure that you have a light switch at the top of the stairs and the bottom of the stairs. If you do not have them, ask someone to add them for you. What else can I do to help prevent falls? Wear shoes that: Do not have high heels. Have rubber bottoms. Are comfortable and fit you well. Are closed at the toe. Do not wear sandals. If you use a stepladder: Make sure that it is fully opened. Do not climb a closed stepladder. Make sure that both sides of the stepladder are locked into place. Ask someone to hold it for you, if possible. Clearly mark and make sure that you can see: Any grab bars or handrails. First and last steps. Where the edge of each step is. Use tools that help you move around (mobility aids) if they are needed. These include: Canes. Walkers. Scooters. Crutches. Turn on the lights when you go into a dark area. Replace any light bulbs as soon as they burn out. Set up your furniture so you have a clear path. Avoid moving your furniture around. If any of your floors are uneven, fix them. If there are any pets around you, be aware of where they are. Review your medicines with your doctor. Some medicines can make you feel dizzy. This can increase your chance of falling. Ask your doctor what other things that you can do to help prevent falls. This information is not intended to replace advice given to you by your health care provider. Make sure you discuss any questions you have with your health care provider. Document Released: 06/22/2009 Document Revised: 02/01/2016 Document Reviewed: 09/30/2014 Elsevier Interactive Patient Education  2017 [employer].

## 2023-01-01 NOTE — Progress Notes (Signed)
Subjective:   Charles [name] is a 82 y.o. male who presents for Medicare Annual/Subsequent preventive examination.  Review of Systems    Virtual Visit via Telephone Note  I connected with  Charles [name] on 01/01/23 at  9:00 AM EDT by telephone and verified that I am speaking with the correct person using two identifiers.  Location: Patient: Home Provider: Office Persons participating in the virtual visit: patient/Nurse Health Advisor   I discussed the limitations, risks, security and privacy concerns of performing an evaluation and management service by telephone and the availability of in person appointments. The patient expressed understanding and agreed to proceed.  Interactive audio and video telecommunications were attempted between this nurse and patient, however failed, due to patient having technical difficulties OR patient did not have access to video capability.  We continued and completed visit with audio only.  Some vital signs may be absent or patient reported.   [name], LPN  Cardiac Risk Factors include: advanced age (>47men, >51 women);male gender;diabetes mellitus;hypertension     Objective:    Today's Vitals   01/01/23 0944  Weight: 240 lb (108.9 kg)  Height: 5' 10.25" (1.784 m)   Body mass index is 34.19 kg/m.     01/01/2023    9:56 AM 07/12/2022    6:13 AM 12/26/2021    9:06 AM 01/25/2021   10:14 AM 01/08/2021    8:10 AM 12/20/2020    9:06 AM 10/18/2020    9:53 AM  Advanced Directives  Does Patient Have a Medical Advance Directive? Yes Yes Yes Yes Yes Yes Yes  Type of Estate agent of [place];Living will Healthcare Power of [place];Living will Healthcare Power of [place];Living will Healthcare Power of [place];Living will Healthcare Power of [employer] Power of [employer] Power of [place];Living will  Does patient want to make changes to medical advance directive?  No - Patient declined No - Patient  declined No - Patient declined   No - Patient declined  Copy of Healthcare Power of Attorney in Chart? No - copy requested No - copy requested No - copy requested No - copy requested  No - copy requested No - copy requested    Current Medications (verified) Outpatient Encounter Medications as of 01/01/2023  Medication Sig   aspirin 81 MG chewable tablet Chew 81 mg by mouth in the morning.   atorvastatin (LIPITOR) 40 MG tablet Take 1 tablet (40 mg total) by mouth daily at 6 PM. (Patient taking differently: Take 40 mg by mouth daily.)   Cholecalciferol (VITAMIN D-3) 125 MCG (5000 UT) TABS Take 5,000 Units by mouth in the morning.   Cyanocobalamin (VITAMIN B-12) 5000 MCG SUBL Place 5,000 mcg under the tongue every 3 (three) days.   cyclobenzaprine (FLEXERIL) 10 MG tablet Take 1 tablet (10 mg total) by mouth at bedtime.   ELIQUIS 5 MG TABS tablet Take 5 mg by mouth 2 (two) times daily.   EPINEPHrine 0.3 mg/0.3 mL IJ SOAJ injection Inject 0.3 mg into the muscle as needed for anaphylaxis.   gabapentin (NEURONTIN) 100 MG capsule TAKE 1 CAPSULE BY MOUTH AT  BEDTIME   glipiZIDE (GLUCOTROL) 5 MG tablet TAKE 1 TABLET BY MOUTH  TWICE DAILY BEFORE A MEAL (Patient taking differently: 2.5 mg.)   Insulin Pen Needle (PEN NEEDLES) 32G X 4 MM MISC Use as instructed to administer weekly injection.   Insulin Syringes, Disposable, U-100 0.5 ML MISC Use 1x a day   metFORMIN (GLUCOPHAGE) 1000 MG  tablet TAKE 1 TABLET BY MOUTH TWICE  DAILY WITH MEALS   metoprolol succinate (TOPROL-XL) 100 MG 24 hr tablet Take 100 mg by mouth in the morning and at bedtime.   Multiple Vitamins-Minerals (PRESERVISION AREDS 2 PO) Take 1 tablet by mouth in the morning and at bedtime.   nitroGLYCERIN (NITROSTAT) 0.4 MG SL tablet Place 1 tablet (0.4 mg total) under the tongue every 5 (five) minutes as needed for chest pain.   ONETOUCH DELICA LANCETS FINE MISC Use to check sugar daily   ONETOUCH VERIO test strip USE 1 STRIP TO CHECK GLUCOSE  ONCE DAILY   tamsulosin (FLOMAX) 0.4 MG CAPS capsule TAKE 1 CAPSULE BY MOUTH DAILY   No facility-administered encounter medications on file as of 01/01/2023.    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   Coronary artery disease    Dysrhythmia    PAF   Eczema    GERD (gastroesophageal reflux disease)    Hernia, ventral    History of blood transfusion    Hyperlipidemia    Hypertension    Myocardial infarction    Neuropathy    Obesity    Personal history of adenomatous colonic polyps 07/20/2012   3 + adenomas 2009 07/20/2012 2 diminutive polyps     Pneumonia 1942; 1950s X 1   Type II diabetes mellitus    type II   Walking pneumonia 2000's X 1   Past Surgical History:  Procedure Laterality Date   A-FLUTTER ABLATION N/A 07/12/2022   Procedure: A-FLUTTER ABLATION;  Surgeon: Maurice Small, MD;  Location: [facility] INVASIVE CV LAB;  Service: Cardiovascular;  Laterality: N/A;   ATRIAL FIBRILLATION ABLATION N/A 07/12/2022   Procedure: ATRIAL FIBRILLATION ABLATION;  Surgeon: Maurice Small, MD;  Location: [facility] INVASIVE CV [facility];  Service: Cardiovascular;  Laterality: N/A;   CARDIAC CATHETERIZATION N/A 10/19/2015   Procedure: Left Heart Cath and Coronary Angiography;  Surgeon: Rinaldo Cloud, MD;  [facility]: [facility] INV[facility] CV [facility];  Service: Cardiovascular;  Laterality: N/A;   CARDIAC CATHETERIZATION N/A 10/19/2015   Procedure: Coronary Stent Intervention;  Surgeon: Rinaldo Cloud, MD;  Location: [facility] INVASIVE CV LAB;  Service: Cardiovascular;  Laterality: N/A;   CATARACT EXTRACTION, BILATERAL Bilateral 01/2018   Dr. Dagoberto Ligas did surgery and he developed MD and is seeing specialist now for MD treatment   COLONOSCOPY     "nothing showed up this time"   COLONOSCOPY W/ BIOPSIES AND  POLYPECTOMY  X 1   CORONARY ANGIOPLASTY     EXCISION MASS NECK Left 01/08/2021   Procedure: EXCISION MASS NECK;  Surgeon: Christia Reading, MD;  Location[facility] OR;  Service: ENT;  Laterality: Left;   FEMUR IM NAIL Right 07/26/2013   Procedure: INTRAMEDULLARY (IM) NAIL FEMORAL subtrochanteric;  Surgeon: Shelda Pal, MD;  Location: [facility] OR;  Service: Orthopedics;  Laterality: Right;   FRACTURE SURGERY     INGUINAL HERNIA REPAIR Left >3 times   INGUINAL HERNIA REPAIR Right 2012   NEPHRECTOMY Left 2000   TONSILLECTOMY  1940s   Family History  Problem Relation Age of Onset   Drug abuse Other    Cancer Other    Heart disease Other    Lung disease Other  Diabetes Mother    Diabetes Sister    Colon cancer Neg Hx    Stomach cancer Neg Hx    Social History   Socioeconomic History   Marital status: Widowed    Spouse name: Not on file   Number of children: 3   Years of education: Not on file   Highest education level: Master's degree (e.g., MA, MS, MEng, MEd, MSW, MBA)  Occupational History   Occupation: retired  Tobacco Use   Smoking status: Former    Years: 20    Types: Cigarettes, Pipe, Cigars    Quit date: 09/09/1968    Years since quitting: 54.3   Smokeless tobacco: Never   Tobacco comments:    smoking cigars- quit in 2006  , quit pipe 2000  Vaping Use   Vaping Use: Never used  Substance and Sexual Activity   Alcohol use: Not Currently    Alcohol/week: 2.0 standard drinks of alcohol    Types: 2 Glasses of wine per week    Comment: 2 glasses on Tuesday   Drug use: No   Sexual activity: Never  Other Topics Concern   Not on file  Social History Narrative   Regular exercise: [facility] the [facility] and walks   Caffeine use: daily; coffee   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     widowed    Three children, One in [place], one in [place] one in [place]   [facility] - 2 : youngest daughter lives with him   Social Determinants of Health   Financial Resource Strain: Low  Risk  (01/01/2023)   Overall Financial Resource Strain (CARDIA)    Difficulty of Paying Living Expenses: Not hard at all  Food Insecurity: No Food Insecurity (01/01/2023)   Hunger Vital Sign    Worried About Running Out of Food in the Last Year: Never true    Ran Out of Food in the Last Year: Never true  Transportation Needs: No Transportation Needs (01/01/2023)   PRAPARE - Administrator, Civil Service (Medical): No    Lack of Transportation (Non-Medical): No  Physical Activity: Sufficiently Active (01/01/2023)   Exercise Vital Sign    Days of Exercise per Week: 5 days    Minutes of Exercise per Session: 60 min  Stress: No Stress Concern Present (01/01/2023)   [employer] of Occupational Health - Occupational Stress Questionnaire    Feeling of Stress : Not at all  Social Connections: Moderately Integrated (01/01/2023)   Social Connection and Isolation Panel [NHANES]    Frequency of Communication with Friends and Family: More than three times a week    Frequency of Social Gatherings with Friends and Family: More than three times a week    Attends Religious Services: More than 4 times per year    Active Member of [employer] or Organizations: Yes    Attends Banker Meetings: More than 4 times per year    Marital Status: Widowed    Tobacco Counseling Counseling given: Not Answered Tobacco comments: smoking cigars- quit in 2006  , quit pipe 2000   Clinical Intake:  Pre-visit preparation completed: Yes  Pain : No/denies pain     BMI - recorded: 34.19 Nutritional Status: BMI > 30  Obese Nutritional Risks: None Diabetes: Yes CBG done?: No Did pt. bring in CBG monitor from home?: No  How often do you need to have someone help you when you read instructions, pamphlets, or other written materials from your doctor  or pharmacy?: 1 - Never  Diabetic?  Yes  Interpreter Needed?: No Nutrition Risk Assessment:  Has the patient had any N/V/D within the last 2  months?  No  Does the patient have any non-healing wounds?  No  Has the patient had any unintentional weight loss or weight gain?  No   Diabetes:  Is the patient diabetic?  Yes  If diabetic, was a CBG obtained today?  No  Did the patient bring in their glucometer from home?  No  How often do you monitor your CBG's? Daily.   Financial Strains and Diabetes Management:  Are you having any financial strains with the device, your supplies or your medication? No .  Does the patient want to be seen by Chronic Care Management for management of their diabetes?  No  Would the patient like to be referred to a Nutritionist or for Diabetic Management?  No   Diabetic Exams:  Diabetic Eye Exam: Completed . Overdue for diabetic eye exam. Pt has been advised about the importance in completing this exam. A referral has been placed today. Message sent to referral coordinator for scheduling purposes. Advised pt to expect a call from office referred to regarding appt.  Diabetic Foot Exam: Completed . Pt has been advised about the importance in completing this exam. Pt is scheduled for diabetic foot exam on Followed by Wyonia Hough.   Information entered by :: Theresa Mulligan LPN   Activities of Daily Living    01/01/2023    9:54 AM 12/29/2022    8:47 PM  In your present state of health, do you have any difficulty performing the following activities:  Hearing? 0 1  Vision? 0 0  Difficulty concentrating or making decisions? 0 0  Walking or climbing stairs? 0 0  Dressing or bathing? 0 0  Doing errands, shopping? 0 0  Preparing Food and eating ? N N  Using the Toilet? N N  In the past six months, have you accidently leaked urine? Y Y  Comment Followed by PCP   Do you have problems with loss of bowel control? N N  Managing your Medications? N N  Managing your Finances? N N  Housekeeping or managing your Housekeeping? N N    Patient Care Team: Shirline Frees, NP as PCP - General (Family  Medicine) Mealor, Roberts Gaudy, MD as PCP - Electrophysiology (Cardiology) Christia Reading, MD as Consulting Physician (Otolaryngology) Rinaldo Cloud, MD as Consulting Physician (Cardiology) Carlus Pavlov, MD as Consulting Physician (Internal Medicine) Sherrill Raring, [facility] (Pharmacist)  Indicate any recent Medical Services you may have received from other than [facility] providers in the past year (date may be approximate).     Assessment:   This is a routine wellness examination for Yavuz.  Hearing/Vision screen Hearing Screening - Comments:: Denies hearing difficulties   Vision Screening - Comments:: Wears rx glasses - up to date with routine eye exams with  [facility]  Dietary issues and exercise activities discussed: Current Exercise Habits: Home exercise routine, Type of exercise: walking, Time (Minutes): 60, Frequency (Times/Week): 5, Weekly Exercise (Minutes/Week): 300, Exercise limited by: None identified   Goals Addressed               This Visit's Progress     Stay Healthy (pt-stated)        Stay alive!       Depression Screen    01/01/2023    9:52 AM 12/26/2021    9:00 AM 10/09/2021  9:38 AM 12/20/2020    9:05 AM 10/18/2020    9:55 AM 10/06/2020    9:10 AM 08/25/2020    1:55 PM  PHQ 2/9 Scores  PHQ - 2 Score 0 0 0 0 0 0 0    Fall Risk    01/01/2023    9:56 AM 01/01/2023    9:55 AM 12/29/2022    8:47 PM 12/26/2021    9:05 AM 10/11/2021    4:38 PM  Fall Risk   Falls in the past year? 1 1 1  0 0  Number falls in past yr: 0 0 0 0   Injury with Fall? 0 0 0 0   Risk for fall due to : No Fall Risks No Fall Risks  No Fall Risks   Follow up Falls prevention discussed Falls prevention discussed       FALL RISK PREVENTION PERTAINING TO THE HOME:  Any stairs in or around the home? Yes  If so, are there any without handrails? No  Home free of loose throw rugs in walkways, pet beds, electrical cords, etc? Yes  Adequate lighting in your home to reduce risk of  falls? Yes   ASSISTIVE DEVICES UTILIZED TO PREVENT FALLS:  Life alert? No  Use of a cane, walker or w/c? No  Grab bars in the bathroom? No  Shower chair or bench in shower? Yes Elevated toilet seat or a handicapped toilet? No   TIMED UP AND GO:  Was the test performed? No . Audio Visit   Cognitive Function:        01/01/2023    9:57 AM 12/26/2021    9:06 AM 12/20/2020    9:10 AM 10/12/2019    8:46 AM  6CIT Screen  What Year? 0 points 0 points 0 points 0 points  What month? 0 points 0 points 0 points 0 points  What time? 0 points 0 points  0 points  Count back from 20 0 points 0 points 0 points 0 points  Months in reverse 0 points 0 points 0 points 0 points  Repeat phrase 0 points 0 points 0 points 0 points  Total Score 0 points 0 points  0 points    Immunizations Immunization History  Administered Date(s) Administered   Fluad Quad(high Dose 65+) 07/05/2019, 06/18/2021   Influenza Split 07/18/2011, 07/06/2012, 07/07/2012   Influenza Whole 06/09/2006, 06/17/2008, 07/10/2009, 05/18/2010   Influenza, High Dose Seasonal PF 06/11/2016, 06/06/2017, 05/26/2018   Influenza,inj,Quad PF,6+ Mos 06/14/2013, 06/02/2014, 07/14/2015   Influenza-Unspecified 06/27/2020, 06/10/2022   PFIZER(Purple Top)SARS-COV-2 Vaccination 09/28/2019, 10/19/2019, 06/18/2020   Pneumococcal Conjugate-13 07/14/2014   Pneumococcal Polysaccharide-23 06/09/2006   Td 09/09/2001   Tdap 02/20/2012   Zoster Recombinat (Shingrix) 09/15/2021, 11/24/2021    TDAP status: Due, Education has been provided regarding the importance of this vaccine. Advised may receive this vaccine at local pharmacy or Health Dept. Aware to provide a copy of the vaccination record if obtained from local pharmacy or Health Dept. Verbalized acceptance and understanding.  Flu Vaccine status: Up to date  Pneumococcal vaccine status: Up to date  Covid-19 vaccine status: Completed vaccines  Qualifies for Shingles Vaccine? Yes   Zostavax  completed Yes   Shingrix Completed?: Yes  Screening Tests Health Maintenance  Topic Date Due   DTaP/Tdap/Td (3 - Td or Tdap) 02/19/2022   COVID-19 Vaccine (4 - 2023-24 season) 01/17/2023 (Originally 05/10/2022)   OPHTHALMOLOGY EXAM  02/07/2023   HEMOGLOBIN A1C  03/14/2023   INFLUENZA VACCINE  04/10/2023   Diabetic  kidney evaluation - Urine ACR  07/17/2023   FOOT EXAM  09/14/2023   Diabetic kidney evaluation - eGFR measurement  10/12/2023   Medicare Annual Wellness (AWV)  01/01/2024   Pneumonia Vaccine 15+ Years old  Completed   Zoster Vaccines- Shingrix  Completed   HPV VACCINES  Aged Out   COLONOSCOPY (Pts 45-66yrs Insurance coverage will need to be confirmed)  Discontinued    Health Maintenance  Health Maintenance Due  Topic Date Due   DTaP/Tdap/Td (3 - Td or Tdap) 02/19/2022    Colorectal cancer screening: No longer required.   Lung Cancer Screening: (Low Dose CT Chest recommended if Age 52-80 years, 30 pack-year currently smoking OR have quit w/in 15years.) does not qualify.     Additional Screening:  Hepatitis C Screening: does not qualify; Completed   Vision Screening: Recommended annual ophthalmology exams for early detection of glaucoma and other disorders of the eye. Is the patient up to date with their annual eye exam?  Yes  Who is the provider or what is the name of the office in which the patient attends annual eye exams? [facility] If pt is not established with a provider, would they like to be referred to a provider to establish care? No .   Dental Screening: Recommended annual dental exams for proper oral hygiene  Community Resource Referral / Chronic Care Management:  CRR required this visit?  No   CCM required this visit?  No      Plan:     I have personally reviewed and noted the following in the patient's chart:   Medical and social history Use of alcohol, tobacco or illicit drugs  Current medications and supplements including opioid  prescriptions. Patient is not currently taking opioid prescriptions. Functional ability and status Nutritional status Physical activity Advanced directives List of other physicians Hospitalizations, surgeries, and ER visits in previous 12 months Vitals Screenings to include cognitive, depression, and falls Referrals and appointments  In addition, I have reviewed and discussed with patient certain preventive protocols, quality metrics, and best practice recommendations. A written personalized care plan for preventive services as well as general preventive health recommendations were provided to patient.     [name], LPN   1/61/0960   Nurse Notes:   None

## 2023-01-14 ENCOUNTER — Encounter: Payer: Self-pay | Admitting: Internal Medicine

## 2023-01-14 ENCOUNTER — Ambulatory Visit: Payer: Medicare Other | Admitting: Internal Medicine

## 2023-01-14 VITALS — BP 132/68 | HR 70 | Ht 70.25 in | Wt 255.2 lb

## 2023-01-14 DIAGNOSIS — E1159 Type 2 diabetes mellitus with other circulatory complications: Secondary | ICD-10-CM

## 2023-01-14 DIAGNOSIS — G6289 Other specified polyneuropathies: Secondary | ICD-10-CM

## 2023-01-14 DIAGNOSIS — R7989 Other specified abnormal findings of blood chemistry: Secondary | ICD-10-CM

## 2023-01-14 DIAGNOSIS — Z7984 Long term (current) use of oral hypoglycemic drugs: Secondary | ICD-10-CM | POA: Diagnosis not present

## 2023-01-14 DIAGNOSIS — E785 Hyperlipidemia, unspecified: Secondary | ICD-10-CM

## 2023-01-14 DIAGNOSIS — E1165 Type 2 diabetes mellitus with hyperglycemia: Secondary | ICD-10-CM

## 2023-01-14 DIAGNOSIS — E538 Deficiency of other specified B group vitamins: Secondary | ICD-10-CM | POA: Diagnosis not present

## 2023-01-14 DIAGNOSIS — E119 Type 2 diabetes mellitus without complications: Secondary | ICD-10-CM

## 2023-01-14 LAB — POCT GLYCOSYLATED HEMOGLOBIN (HGB A1C): Hemoglobin A1C: 7.8 % — AB (ref 4.0–5.6)

## 2023-01-14 MED ORDER — ONETOUCH VERIO VI STRP: ORAL_STRIP | 3 refills | Status: DC

## 2023-01-14 MED ORDER — SEMAGLUTIDE(0.25 OR 0.5MG/DOS) 2 MG/3ML ~~LOC~~ SOPN: PEN_INJECTOR | SUBCUTANEOUS | 3 refills | Status: DC

## 2023-01-14 NOTE — Progress Notes (Signed)
Patient ID: Charles [name], male   DOB: 24-Jul-1941, 82 y.o.   MRN: [id number]  HPI: Charles [name] is a 82 y.o.-year-old male, returning for f/u for DM2 dx 2008, uncontrolled, with complications (CAD - s/p NSTEMI, PN, mild CKD). Last visit 4 months ago.  Interim history: No increased urination, blurry vision, nausea, chest pain.  DM2: Reviewed HbA1c levels: Lab Results  Component Value Date   HGBA1C 7.7 (A) 09/13/2022   HGBA1C 5.9 (A) 05/08/2022   HGBA1C 7.7 (A) 12/19/2021  12/2013: 8.6%  Pt is on a regimen of: - Metformin 1000 mg 2x a day >> 2000 mg with dinner (but sugars worse) >> 1000 mg 2x a day - Glipizide 10 >> 5 mg 2x a day before meals >> 5 mg in a.m. and 5-10 mg before dinner >> 2.5 mg 2x a day before meals >> 1x a day before dinner >> after dinner -  >> 700$ >> stopped since last visit due to price We tried to start Ozempic 0.25 mg weekly -08/2020 >> too expensive. In the past, I recommended  NPH 14 units at bedtime, but he did not start. We tried Rybelsus 3 >> 6 mg daily-added 12/2018 >> $$$ >> stopped 01/2019 He checked with his insurance and none of the SGLT2 inhibitors are covered for him.  Pt checks his sugars once a day: - am: 115-184 >> 50, 88-110, 146 >> 79, 105-139, 151 >> 110-181 - 2h after b'fast: 68x1 >> n/c >> 201, 206 >> n/c - before lunch:  n/c >> 69, 79 >> n/c >> 90 x1 >> n/c - 2h after lunch: 123-176 >> n/c >> 317 - before dinner: n/c >> 97 >> 90s >> n/c >> 80 >> n/c - 2h after dinner: n/c >> 240 >> n/c - bedtime: 110-150 >> 160s >> n/c Lowest sugar was  50 >> 79 >> 100; he has hypoglycemia awareness in the 80s. Highest sugar was 146 >> 200 >> 317  (forgot meds).  -+ Mild CKD, last BUN/creatinine:  Lab Results  Component Value Date   BUN 12 10/11/2022   CREATININE 0.96 10/11/2022  Of note, he has a history of nephrectomy. On lisinopril 10.  -+ HL; last set of lipids: Lab Results  Component Value Date   CHOL 115 09/13/2022   HDL 38.90 (L)  09/13/2022   LDLCALC 47 09/13/2022   LDLDIRECT 139.8 07/08/2014   TRIG 145.0 09/13/2022   CHOLHDL 3 09/13/2022  On Lipitor 40.  - last eye exam was in 01/2022: No DR; now seeing Dr. Burundi and Dr. Lita Mains- retinal vein occlusion, + cataracts, + macular "hole".  He has a history of eye surgeries.  He gets intraocular injections  - his numbness and tingling in feet improved after starting B12. Foot exam 09/13/2022.  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  B12 deficiency: -Diagnosed during investigation for numbness and tingling (peripheral neuropathy) -He was initially on B12 injections, then on 5000 mcg daily -He was on 5000 mcg every 3 days, changed by PCP 09/2019 -We changed to 5000 mcg weekly 09/2022  Reviewed his B12 levels: Lab Results  Component Value Date   VITAMINB12 >1500 (H) 09/13/2022   VITAMINB12 832 10/09/2021   VITAMINB12 900 10/06/2020   VITAMINB12 748 12/22/2019   VITAMINB12 1,033 (H) 10/08/2019   VITAMINB12 1,172 (H) 08/19/2018   VITAMINB12 >1500 (H) 05/04/2018  [id number] 1,249 (H) 02/18/2017   VITAMINB12 234 10/22/2016   VITAMINB12 217 04/19/2016   He was admitted with NSTEMI 10/18/2015.  He had stents placed then.  He was going to the gym 5 out of 7 days, but stopped during the coronavirus pandemic.  He restarted to go to the gym afterwards.  ROS: + See HPI  I reviewed pt's medications, allergies, PMH, social hx, family hx, and changes were documented in the history of present illness. Otherwise, unchanged from my initial visit note.  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   Coronary artery disease    Dysrhythmia    PAF   Eczema     GERD (gastroesophageal reflux disease)    Hernia, ventral    History of blood transfusion    Hyperlipidemia    Hypertension    Myocardial infarction (HCC)    Neuropathy    Obesity    Personal history of adenomatous colonic polyps 07/20/2012   3 + adenomas 2009 07/20/2012 2 diminutive polyps     Pneumonia 1942; 1950s X 1   Type II diabetes mellitus (HCC)    type II   Walking pneumonia 2000's X 1   Past Surgical History:  Procedure Laterality Date   A-FLUTTER ABLATION N/A 07/12/2022   Procedure: A-FLUTTER ABLATION;  Surgeon: Maurice Small, MD;  Location: [facility] INVASIVE CV LAB;  Service: Cardiovascular;  Laterality: N/A;   ATRIAL FIBRILLATION ABLATION N/A 07/12/2022   Procedure: ATRIAL FIBRILLATION ABLATION;  Surgeon: Maurice Small, MD;  Location: [facility];  Service: Cardiovascular;  Laterality: N/A;   CARDIAC CATHETERIZATION N/A 10/19/2015   Procedure: Left Heart Cath and Coronary Angiography;  Surgeon: Rinaldo Cloud, MD;  Location: [facility] INVASIVE CV LAB;  Service: Cardiovascular;  Laterality: N/A;   CARDIAC CATHETERIZATION N/A 10/19/2015   Procedure: Coronary Stent Intervention;  Surgeon: Rinaldo Cloud, MD;  Location: [facility] INVASIVE CV [facility];  Service: Cardiovascular;  Laterality: N/A;   CATARACT EXTRACTION, BILATERAL Bilateral 01/2018   Dr. Dagoberto Ligas did surgery and he developed MD and is seeing specialist now for MD treatment   COLONOSCOPY     "nothing showed up this time"   COLONOSCOPY W/ BIOPSIES AND POLYPECTOMY  X 1   CORONARY ANGIOPLASTY     EXCISION MASS NECK Left 01/08/2021   Procedure: EXCISION MASS NECK;  Surgeon: Christia Reading, MD;  Location[facility];  Service: ENT;  Laterality: Left;   FEMUR IM NAIL Right 07/26/2013   Procedure: INTRAMEDULLARY (IM) NAIL FEMORAL subtrochanteric;  Surgeon: Shelda Pal, MD;  Location: [facility] OR;  Service: Orthopedics;  Laterality: Right;   FRACTURE SURGERY     INGUINAL HERNIA REPAIR Left >3 times   INGUINAL HERNIA REPAIR Right 2012    NEPHRECTOMY Left 2000   TONSILLECTOMY  1940s   Social History   Socioeconomic History   Marital status: Widowed    Spouse name: Not on file   Number of children: 3   Years of education: Not on file   Highest education level: Master's degree (e.g., MA, MS, MEng, MEd, MSW, MBA)  Occupational History   Occupation: retired  Tobacco Use   Smoking status: Former    Years: 20    Types: Cigarettes, Pipe, Software engineer  Quit date: 09/09/1968    Years since quitting: 54.3   Smokeless tobacco: Never   Tobacco comments:    smoking cigars- quit in 2006  , quit pipe 2000  Vaping Use   Vaping Use: Never used  Substance and Sexual Activity   Alcohol use: Not Currently    Alcohol/week: 2.0 standard drinks of alcohol    Types: 2 Glasses of wine per week    Comment: 2 glasses on Tuesday   Drug use: No   Sexual activity: Never  Other Topics Concern   Not on file  Social History Narrative   Regular exercise: goes to the [facility] and walks   Caffeine use: daily; coffee   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     widowed    Three children, One in [place], one in [place] one in [place]   [facility] - 2 : youngest daughter lives with him   Social Determinants of Health   Financial Resource Strain: Low Risk  (01/01/2023)   Overall Financial Resource Strain (CARDIA)    Difficulty of Paying Living Expenses: Not hard at all  Food Insecurity: No Food Insecurity (01/01/2023)   Hunger Vital Sign    Worried About Running Out of Food in the Last Year: Never true    Ran Out of Food in the Last Year: Never true  Transportation Needs: No Transportation Needs (01/01/2023)   PRAPARE - Administrator, Civil Service (Medical): No    Lack of Transportation (Non-Medical): No  Physical Activity: Sufficiently Active (01/01/2023)   Exercise Vital Sign    Days of Exercise per Week: 5 days    Minutes of Exercise per Session: 60 min  Stress: No Stress Concern Present (01/01/2023)   [employer] of  Occupational Health - Occupational Stress Questionnaire    Feeling of Stress : Not at all  Social Connections: Moderately Integrated (01/01/2023)   Social Connection and Isolation Panel [NHANES]    Frequency of Communication with Friends and Family: More than three times a week    Frequency of Social Gatherings with Friends and Family: More than three times a week    Attends Religious Services: More than 4 times per year    Active Member of [employer] or Organizations: Yes    Attends Banker Meetings: More than 4 times per year    Marital Status: Widowed  Intimate Partner Violence: Not At Risk (01/01/2023)   Humiliation, Afraid, Rape, and Kick questionnaire    Fear of Current or Ex-Partner: No    Emotionally Abused: No    Physically Abused: No    Sexually Abused: No   Current Outpatient Medications on File Prior to Visit  Medication Sig Dispense Refill   aspirin 81 MG chewable tablet Chew 81 mg by mouth in the morning.     atorvastatin (LIPITOR) 40 MG tablet Take 1 tablet (40 mg total) by mouth daily at 6 PM. (Patient taking differently: Take 40 mg by mouth daily.) 30 tablet 3   Cholecalciferol (VITAMIN D-3) 125 MCG (5000 UT) TABS Take 5,000 Units by mouth in the morning.     Cyanocobalamin (VITAMIN B-12) 5000 MCG SUBL Place 5,000 mcg under the tongue every 3 (three) days.     cyclobenzaprine (FLEXERIL) 10 MG tablet Take 1 tablet (10 mg total) by mouth at bedtime. 15 tablet 0   ELIQUIS 5 MG TABS tablet Take 5 mg by mouth 2 (two) times daily.     EPINEPHrine 0.3 mg/0.3 mL IJ  SOAJ injection Inject 0.3 mg into the muscle as needed for anaphylaxis.     gabapentin (NEURONTIN) 100 MG capsule TAKE 1 CAPSULE BY MOUTH AT  BEDTIME 90 capsule 3   glipiZIDE (GLUCOTROL) 5 MG tablet TAKE 1 TABLET BY MOUTH  TWICE DAILY BEFORE A MEAL (Patient taking differently: 2.5 mg.) 180 tablet 3   Insulin Pen Needle (PEN NEEDLES) 32G X 4 MM MISC Use as instructed to administer weekly injection. 30 each 1    Insulin Syringes, Disposable, U-100 0.5 ML MISC Use 1x a day 100 each 3   metFORMIN (GLUCOPHAGE) 1000 MG tablet TAKE 1 TABLET BY MOUTH TWICE  DAILY WITH MEALS 200 tablet 2   metoprolol succinate (TOPROL-XL) 100 MG 24 hr tablet Take 100 mg by mouth in the morning and at bedtime.     Multiple Vitamins-Minerals (PRESERVISION AREDS 2 PO) Take 1 tablet by mouth in the morning and at bedtime.     nitroGLYCERIN (NITROSTAT) 0.4 MG SL tablet Place 1 tablet (0.4 mg total) under the tongue every 5 (five) minutes as needed for chest pain. 25 tablet 1   ONETOUCH DELICA LANCETS FINE MISC Use to check sugar daily 100 each 5   ONETOUCH VERIO test strip USE 1 STRIP TO CHECK GLUCOSE ONCE DAILY 100 each 3   tamsulosin (FLOMAX) 0.4 MG CAPS capsule TAKE 1 CAPSULE BY MOUTH DAILY 100 capsule 2   No current facility-administered medications on file prior to visit.   Allergies  Allergen Reactions   Bee Venom Anaphylaxis   Family History  Problem Relation Age of Onset   Drug abuse Other    Cancer Other    Heart disease Other    Lung disease Other    Diabetes Mother    Diabetes Sister    Colon cancer Neg Hx    Stomach cancer Neg Hx    PE: BP 132/68 (BP Location: Right Arm, Patient Position: Sitting, Cuff Size: Normal)   Pulse 70   Ht 5' 10.25" (1.784 m)   Wt 255 lb 3.2 oz (115.8 kg)   SpO2 99%   BMI 36.36 kg/m  Wt Readings from Last 3 Encounters:  01/14/23 255 lb 3.2 oz (115.8 kg)  01/01/23 240 lb (108.9 kg)  10/11/22 255 lb (115.7 kg)   Constitutional: overweight, in NAD Eyes: EOMI, no exophthalmos ENT: no thyromegaly, no cervical lymphadenopathy but enlarged R parotid gland Cardiovascular: RRR, No MRG, +R>L LE swelling Respiratory: CTA B Musculoskeletal: no deformities Skin:  no rashes Neurological: no tremor with outstretched hands  ASSESSMENT: 1. DM2, not insulin-dependent, uncontrolled, with complications - CAD - s/p NSTEMI (Dr. Sharyn Lull) - peripheral neuropathy - CKD   He does not  have a family history of medullary thyroid cancer or personal history of pancreatitis.   2. B12 def  3.  Peripheral neuropathy  4. HL  5.  Elevated TSH -In the setting of amiodarone treatment  PLAN:  1. Patient with longstanding, uncontrolled, type 2 diabetes, with much better control after adding Ozempic (but now off the GLP-1 receptor agonist due to price).  Currently on metformin and sulfonylurea.  - At last visit, HbA1c was higher, at 7.7%.  Sugars were at or slightly above target in the morning and he was not checking later in the day.  I again advised him to try to do so.  He was off Ozempic and did not use glipizide twice a day.  I advised him to take this before dinner as he was taking it without any  relationship to the last meal of the day.   -At today's visit, upon questioning, he still takes the glipizide later, after dinner, the sugars remain uncontrolled.  We discussed that we need to optimize his regimen.  I again suggested to try Ozempic to see if it has better coverage now that data showing improvement in cardiovascular outcomes with GLP-1 receptor agonist a more robust.  However, we have to have a plan B -suggested extended release glipizide in the morning, since he is consistent in taking his medications around breakfast. - I advised him to: Patient Instructions  Please continue: - Metformin 1000 mg 2x a day with meals  Please start: - Ozempic 0.25 mg weekly in a.m. (for example on Sunday morning) x 4 weeks, then increase to 0.5 mg weekly in a.m. if no nausea or hypoglycemia.  If you cannot start Ozempic, start: - Glipizide ER 5 mg before b'fast  Please check some sugars later in the day, also.   Please return in 4 months with your sugar log.   - we checked his HbA1c: 7.8% (higher) - advised to check sugars at different times of the day - 1x a day, rotating check times - advised for yearly eye exams >> he is UTD - return to clinic in 4 months    2.  And 3.  Peripheral neuropathy and B12 deficiency -He is peripheral neuropathy probably related to both diabetes and B12 deficiency -He was on 5000 mcg B12 every 3 days at last visit -B12 level was slightly elevated at last check so we decreased his supplement to 5000 mcg weekly -Will recheck the B12 level at next visit  4. HL -Reviewed latest lipid panel from 09/2022: Fractions at goal with the exception of a slightly low HDL: Lab Results  Component Value Date   CHOL 115 09/13/2022   HDL 38.90 (L) 09/13/2022   LDLCALC 47 09/13/2022   LDLDIRECT 139.8 07/08/2014   TRIG 145.0 09/13/2022   CHOLHDL 3 09/13/2022  -He continues Lipitor 40 mg daily without side effects  5.  High TSH -He had an elevated TSH (9.93) in the past while on amiodarone, but he stopped amiodarone since -At last visit, TFTs were all normal -No signs or symptoms of hypothyroidism -Will continue to follow him expectantly  Carlus Pavlov, MD PhD [facility] Endocrinology

## 2023-01-14 NOTE — Patient Instructions (Addendum)
Please continue: - Metformin 1000 mg 2x a day with meals  Please start: - Ozempic 0.25 mg weekly in a.m. (for example on Sunday morning) x 4 weeks, then increase to 0.5 mg weekly in a.m. if no nausea or hypoglycemia.  If you cannot start Ozempic, start: - Glipizide ER 5 mg before b'fast  Please check some sugars later in the day, also.   Please return in 4 months with your sugar log.

## 2023-01-16 DIAGNOSIS — H35033 Hypertensive retinopathy, bilateral: Secondary | ICD-10-CM | POA: Diagnosis not present

## 2023-01-16 DIAGNOSIS — H34811 Central retinal vein occlusion, right eye, with macular edema: Secondary | ICD-10-CM | POA: Diagnosis not present

## 2023-01-16 DIAGNOSIS — H43812 Vitreous degeneration, left eye: Secondary | ICD-10-CM | POA: Diagnosis not present

## 2023-01-16 DIAGNOSIS — H35373 Puckering of macula, bilateral: Secondary | ICD-10-CM | POA: Diagnosis not present

## 2023-01-16 DIAGNOSIS — E113293 Type 2 diabetes mellitus with mild nonproliferative diabetic retinopathy without macular edema, bilateral: Secondary | ICD-10-CM | POA: Diagnosis not present

## 2023-01-16 DIAGNOSIS — H353133 Nonexudative age-related macular degeneration, bilateral, advanced atrophic without subfoveal involvement: Secondary | ICD-10-CM | POA: Diagnosis not present

## 2023-01-22 DIAGNOSIS — E119 Type 2 diabetes mellitus without complications: Secondary | ICD-10-CM | POA: Diagnosis not present

## 2023-01-22 LAB — HM DIABETES EYE EXAM

## 2023-01-24 ENCOUNTER — Encounter: Payer: Self-pay | Admitting: Internal Medicine

## 2023-01-30 DIAGNOSIS — L821 Other seborrheic keratosis: Secondary | ICD-10-CM | POA: Diagnosis not present

## 2023-01-30 DIAGNOSIS — D044 Carcinoma in situ of skin of scalp and neck: Secondary | ICD-10-CM | POA: Diagnosis not present

## 2023-01-30 DIAGNOSIS — Z85828 Personal history of other malignant neoplasm of skin: Secondary | ICD-10-CM | POA: Diagnosis not present

## 2023-01-30 DIAGNOSIS — L570 Actinic keratosis: Secondary | ICD-10-CM | POA: Diagnosis not present

## 2023-01-30 DIAGNOSIS — D692 Other nonthrombocytopenic purpura: Secondary | ICD-10-CM | POA: Diagnosis not present

## 2023-01-30 DIAGNOSIS — D225 Melanocytic nevi of trunk: Secondary | ICD-10-CM | POA: Diagnosis not present

## 2023-01-30 DIAGNOSIS — L820 Inflamed seborrheic keratosis: Secondary | ICD-10-CM | POA: Diagnosis not present

## 2023-02-06 ENCOUNTER — Telehealth: Payer: Self-pay

## 2023-02-06 NOTE — Progress Notes (Signed)
Care Management & Coordination Services Pharmacy Team  Reason for Encounter: Diabetes  Contacted patient to discuss diabetes disease state. Spoke with patient on 02/07/2023   Current antihyperglycemic regimen:  Glipizide 5 mg once daily (per Dr. Elvera Lennox) Metformin 1000 mg twice daily  Patient verbally confirms he is taking the above medications as directed. Yes  What diet changes have been made to improve diabetes control? No current changes  What recent interventions/DTPs have been made to improve glycemic control:  Started Semaglutide 2mg /28ml inject 0.5 ml weekly. Patient is not taking due to cost  Have there been any recent hospitalizations or ED visits since last visit with PharmD? No recent hospital visits.   Patient denies hypoglycemic symptoms, including None  Patient denies hyperglycemic symptoms, including none  How often are you checking your blood sugar? Patient is checking fasting once daily  What are your blood sugars ranging?  Fasting: Patient states he readings have been between 130/150 every morning  During the week, how often does your blood glucose drop below 70? Patient denies  Are you checking your feet daily/regularly? Yes  Adherence Review: Is the patient currently on a STATIN medication? Yes Is the patient currently on ACE/ARB medication? No Does the patient have >5 day gap between last estimated fill dates? No   Care Gaps: AWV - scheduled 01/01/2023 Last eye exam - 01/22/2023 Last foot exam - 09/13/2022 Last BP - 132/68 01/14/2023 Last A1C - 7.7 on 09/13/2022 Tdap - overdue Covid - overdue   Star Rating Drugs: Atorvastatin 40 mg - last filled 11/14/2022 100 DS at [facility] Glipizide 5 mg - last filled 05/09/2022 100 DS at [facility] (patient is taking this once daily) Metformin 1000 mg - last filled 12/29/2022 100 DS at [facility]  Chart Updates:  Recent office visits:  01/01/2023 Theresa Mulligan LPN - Encounter for Medicare annual wellness exam    Recent consult visits:  01/14/2023 Carlus Pavlov MD (endo) - Patient was seen for Poorly controlled type 2 diabetes mellitus with circulatory disorder and additional concerns. Started Semaglutide 2mg /26ml inject 0.5 ml weekly.   Hospital visits:  None  Medications: Outpatient Encounter Medications as of 02/06/2023  Medication Sig Note   aspirin 81 MG chewable tablet Chew 81 mg by mouth in the morning.    atorvastatin (LIPITOR) 40 MG tablet Take 1 tablet (40 mg total) by mouth daily at 6 PM. (Patient taking differently: Take 40 mg by mouth daily.)    Cholecalciferol (VITAMIN D-3) 125 MCG (5000 UT) TABS Take 5,000 Units by mouth in the morning.    Cyanocobalamin (VITAMIN B-12) 5000 MCG SUBL Place 5,000 mcg under the tongue every 3 (three) days.    cyclobenzaprine (FLEXERIL) 10 MG tablet Take 1 tablet (10 mg total) by mouth at bedtime.    ELIQUIS 5 MG TABS tablet Take 5 mg by mouth 2 (two) times daily.    EPINEPHrine 0.3 mg/0.3 mL IJ SOAJ injection Inject 0.3 mg into the muscle as needed for anaphylaxis. 07/12/2022: Used over 10 years ago.   gabapentin (NEURONTIN) 100 MG capsule TAKE 1 CAPSULE BY MOUTH AT  BEDTIME    glipiZIDE (GLUCOTROL) 5 MG tablet TAKE 1 TABLET BY MOUTH  TWICE DAILY BEFORE A MEAL (Patient taking differently: 2.5 mg.) 07/10/2022: Holding until after the procedure   glucose blood (ONETOUCH VERIO) test strip USE 1 STRIP TO CHECK GLUCOSE ONCE DAILY    Insulin Pen Needle (PEN NEEDLES) 32G X 4 MM MISC Use as instructed to administer weekly injection.  Insulin Syringes, Disposable, U-100 0.5 ML MISC Use 1x a day    metFORMIN (GLUCOPHAGE) 1000 MG tablet TAKE 1 TABLET BY MOUTH TWICE  DAILY WITH MEALS    metoprolol succinate (TOPROL-XL) 100 MG 24 hr tablet Take 100 mg by mouth in the morning and at bedtime.    Multiple Vitamins-Minerals (PRESERVISION AREDS 2 PO) Take 1 tablet by mouth in the morning and at bedtime.    nitroGLYCERIN (NITROSTAT) 0.4 MG SL tablet Place 1 tablet (0.4  mg total) under the tongue every 5 (five) minutes as needed for chest pain. 07/12/2022: Never used as of 07/12/22.   ONETOUCH DELICA LANCETS FINE MISC Use to check sugar daily    Semaglutide,0.25 or 0.5MG /DOS, 2 MG/3ML SOPN Inject 0.5 mg weekly under skin    tamsulosin (FLOMAX) 0.4 MG CAPS capsule TAKE 1 CAPSULE BY MOUTH DAILY    No facility-administered encounter medications on file as of 02/06/2023.  Fill History:  Dispensed Days Supply Quantity Provider Pharmacy  ELIQUIS  5 MG TABS 12/29/2022 100 200 tablet      Dispensed Days Supply Quantity Provider Pharmacy  ATORVASTATIN CALCIUM  40 MG TABS 11/14/2022 100 100 tablet      Dispensed Days Supply Quantity Provider Pharmacy  CYCLOBENZAPR 10MG    TAB 07/31/2022 15 15 each      Dispensed Days Supply Quantity Provider Pharmacy  GABAPENTIN  100 MG CAPS 05/09/2022 100 100 capsule      Dispensed Days Supply Quantity Provider Pharmacy  GLIPIZIDE  5 MG TABS 05/09/2022 100 200 tablet      Dispensed Days Supply Quantity Provider Pharmacy  METFORMIN HYDROCHLORIDE  1000 MG TABS 12/29/2022 100 200 tablet      Dispensed Days Supply Quantity Provider Pharmacy  METOPROLOL SUCCINATE ER  100 MG TB24 02/04/2023 100 150 tablet      Dispensed Days Supply Quantity Provider Pharmacy  semaglutide (Ozempic) 0.25 mg or 0.5 mg (2 mg/3 mL) pen injector 02/05/2022    No Pharmacy Name  OZEMPIC  2 MG/3ML SOPN 12/19/2021 84 9 mL      Dispensed Days Supply Quantity Provider Pharmacy  TAMSULOSIN HYDROCHLORIDE  0.4 MG CAPS 10/25/2022 100 100 capsule     Recent Relevant Labs: Lab Results  Component Value Date/Time   HGBA1C 7.8 (A) 01/14/2023 09:21 AM   HGBA1C 7.7 (A) 09/13/2022 08:48 AM   HGBA1C 7.9 (H) 09/29/2018 02:23 AM   HGBA1C 6.9 (H) 08/08/2016 08:56 AM   MICROALBUR 27.5 (H) 07/16/2022 03:28 PM   MICROALBUR 13.8 (H) 08/08/2016 08:56 AM    Kidney Function Lab Results  Component Value Date/Time   CREATININE 0.96 10/11/2022 09:37 AM   CREATININE 1.37  (H) 06/28/2022 11:05 AM   CREATININE 1.13 04/06/2021 03:50 PM   CREATININE 1.21 (H) 02/18/2017 08:30 AM   GFR 74.11 10/11/2022 09:37 AM   GFRNONAA >60 01/08/2021 08:00 AM   GFRNONAA 58 (L) 02/18/2017 08:30 AM   GFRAA 53 (L) 09/29/2018 10:14 AM   GFRAA 67 02/18/2017 08:30 AM   Inetta Fermo CMA  Clinical Pharmacist Assistant [phone]

## 2023-02-24 DIAGNOSIS — I10 Essential (primary) hypertension: Secondary | ICD-10-CM | POA: Diagnosis not present

## 2023-02-24 DIAGNOSIS — I483 Typical atrial flutter: Secondary | ICD-10-CM | POA: Diagnosis not present

## 2023-02-24 DIAGNOSIS — Z8679 Personal history of other diseases of the circulatory system: Secondary | ICD-10-CM | POA: Diagnosis not present

## 2023-02-24 DIAGNOSIS — I2510 Atherosclerotic heart disease of native coronary artery without angina pectoris: Secondary | ICD-10-CM | POA: Diagnosis not present

## 2023-03-24 DIAGNOSIS — H35373 Puckering of macula, bilateral: Secondary | ICD-10-CM | POA: Diagnosis not present

## 2023-03-24 DIAGNOSIS — H35033 Hypertensive retinopathy, bilateral: Secondary | ICD-10-CM | POA: Diagnosis not present

## 2023-03-24 DIAGNOSIS — H34811 Central retinal vein occlusion, right eye, with macular edema: Secondary | ICD-10-CM | POA: Diagnosis not present

## 2023-03-24 DIAGNOSIS — H43812 Vitreous degeneration, left eye: Secondary | ICD-10-CM | POA: Diagnosis not present

## 2023-03-24 DIAGNOSIS — E113293 Type 2 diabetes mellitus with mild nonproliferative diabetic retinopathy without macular edema, bilateral: Secondary | ICD-10-CM | POA: Diagnosis not present

## 2023-03-24 DIAGNOSIS — H353133 Nonexudative age-related macular degeneration, bilateral, advanced atrophic without subfoveal involvement: Secondary | ICD-10-CM | POA: Diagnosis not present

## 2023-04-25 ENCOUNTER — Other Ambulatory Visit: Payer: Self-pay

## 2023-04-25 DIAGNOSIS — E538 Deficiency of other specified B group vitamins: Secondary | ICD-10-CM

## 2023-04-25 DIAGNOSIS — E1122 Type 2 diabetes mellitus with diabetic chronic kidney disease: Secondary | ICD-10-CM

## 2023-04-25 MED ORDER — GLIPIZIDE 5 MG PO TABS: 5.0000 mg | ORAL_TABLET | Freq: Two times a day (BID) | ORAL | 1 refills | Status: DC

## 2023-04-25 NOTE — Telephone Encounter (Signed)
Rx has been sent.  Requested Prescriptions   Signed Prescriptions Disp Refills   glipiZIDE (GLUCOTROL) 5 MG tablet 180 tablet 1    Sig: Take 1 tablet (5 mg total) by mouth 2 (two) times daily before a meal.    Authorizing Provider: Carlus Pavlov    Ordering User: Pollie Meyer

## 2023-05-09 ENCOUNTER — Ambulatory Visit: Payer: Medicare Other | Admitting: Adult Health

## 2023-05-09 VITALS — BP 110/70 | HR 76 | Temp 97.9°F | Ht 70.5 in | Wt 245.0 lb

## 2023-05-09 DIAGNOSIS — R2681 Unsteadiness on feet: Secondary | ICD-10-CM

## 2023-05-09 DIAGNOSIS — S51812A Laceration without foreign body of left forearm, initial encounter: Secondary | ICD-10-CM

## 2023-05-09 DIAGNOSIS — M25512 Pain in left shoulder: Secondary | ICD-10-CM

## 2023-05-09 NOTE — Progress Notes (Signed)
Subjective:    Patient ID: Charles [name], male    DOB: 01-05-1941, 82 y.o.   MRN: [id number]  Fall   82 year old male who  has a past medical history of Anemia, Arthritis, AVM (arteriovenous malformation) of colon, Cancer of kidney (HCC) (2000), Coronary artery disease, Dysrhythmia, Eczema, GERD (gastroesophageal reflux disease), Hernia, ventral, History of blood transfusion, Hyperlipidemia, Hypertension, Myocardial infarction (HCC), Neuropathy, Obesity, Personal history of adenomatous colonic polyps (07/20/2012), Pneumonia (1942; 1950s X 1), Type II diabetes mellitus (HCC), and Walking pneumonia (2000's X 1).  He presents to the office today for an acute issue. He had a mechanical fall about a week ago. He reports that he was in [place], [place]. He stepped off a curb wrong when getting a cab and fell into the street on his left side. He injured his left shoulder and has trouble raising his arm but is regaining of some ROM over the week. Does have mild pain in the left shoulder with movement but this is improving as well   He has a skin tear on his left forearm.   He did not seek medical care after the fall.   He did not hit his head.   He is up to date on tetanus   He is keeping skin tear clean and covered with bandages. He has not noticed any signs of infection    Review of Systems See HPI   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   Coronary artery disease    Dysrhythmia    PAF   Eczema    GERD (gastroesophageal reflux disease)    Hernia, ventral    History of blood transfusion    Hyperlipidemia    Hypertension    Myocardial infarction (HCC)    Neuropathy    Obesity    Personal history of adenomatous colonic polyps 07/20/2012   3 + adenomas 2009 07/20/2012 2 diminutive polyps      Pneumonia 1942; 1950s X 1   Type II diabetes mellitus (HCC)    type II   Walking pneumonia 2000's X 1    Social History   Socioeconomic History   Marital status: Widowed    Spouse name: Not on file   Number of children: 3   Years of education: Not on file   Highest education level: Master's degree (e.g., MA, MS, MEng, MEd, MSW, MBA)  Occupational History   Occupation: retired  Tobacco Use   Smoking status: Former    Current packs/day: 0.00    Types: Cigarettes, Pipe, Cigars    Start date: 09/09/1948    Quit date: 09/09/1968    Years since quitting: 54.6   Smokeless tobacco: Never   Tobacco comments:    smoking cigars- quit in 2006  , quit pipe 2000  Vaping Use   Vaping status: Never Used  Substance and Sexual Activity   Alcohol use: Not Currently    Alcohol/week: 2.0 standard drinks of alcohol    Types: 2 Glasses of wine per week    Comment: 2 glasses on Tuesday   Drug use: No   Sexual activity: Never  Other Topics Concern   Not on file  Social History Narrative   Regular exercise: goes to  the [employer] and walks   Caffeine use: daily; coffee   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     widowed    Three children, One in [place], one in [place] one in [place]   [facility] - 2 : youngest daughter lives with him   Social Determinants of Health   Financial Resource Strain: Low Risk  (05/08/2023)   Overall Financial Resource Strain (CARDIA)    Difficulty of Paying Living Expenses: Not hard at all  Food Insecurity: No Food Insecurity (05/08/2023)   Hunger Vital Sign    Worried About Running Out of Food in the Last Year: Never true    Ran Out of Food in the Last Year: Never true  Transportation Needs: No Transportation Needs (05/08/2023)   PRAPARE - Administrator, Civil Service (Medical): No    Lack of Transportation (Non-Medical): No  Physical Activity: Sufficiently Active (05/08/2023)   Exercise Vital Sign    Days of Exercise per Week: 5 days    Minutes  of Exercise per Session: 30 min  Stress: No Stress Concern Present (05/08/2023)   [employer] of Occupational Health - Occupational Stress Questionnaire    Feeling of Stress : Not at all  Social Connections: Moderately Integrated (05/08/2023)   Social Connection and Isolation Panel [NHANES]    Frequency of Communication with Friends and Family: More than three times a week    Frequency of Social Gatherings with Friends and Family: More than three times a week    Attends Religious Services: More than 4 times per year    Active Member of [employer] or Organizations: Yes    Attends Banker Meetings: Never    Marital Status: Widowed  Intimate Partner Violence: Not At Risk (01/01/2023)   Humiliation, Afraid, Rape, and Kick questionnaire    Fear of Current or Ex-Partner: No    Emotionally Abused: No    Physically Abused: No    Sexually Abused: No    Past Surgical History:  Procedure Laterality Date   A-FLUTTER ABLATION N/A 07/12/2022   Procedure: A-FLUTTER ABLATION;  Surgeon: Mealor, Roberts Gaudy, MD;  Location: [facility];  Service: Cardiovascular;  Laterality: N/A;   ATRIAL FIBRILLATION ABLATION N/A 07/12/2022   Procedure: ATRIAL FIBRILLATION ABLATION;  Surgeon: Maurice Small, MD;  Location: [facility];  Service: Cardiovascular;  Laterality: N/A;   CARDIAC CATHETERIZATION N/A 10/19/2015   Procedure: Left Heart Cath and Coronary Angiography;  Surgeon: Rinaldo Cloud, MD;  Location: [facility];  Service: Cardiovascular;  Laterality: N/A;   CARDIAC CATHETERIZATION N/A 10/19/2015   Procedure: Coronary Stent Intervention;  Surgeon: Rinaldo Cloud, MD;  Location: [facility];  Service: Cardiovascular;  Laterality: N/A;   CATARACT EXTRACTION, BILATERAL Bilateral 01/2018   Dr. Dagoberto Ligas did surgery and he developed MD and is seeing specialist now for MD treatment   COLONOSCOPY     "nothing showed up this time"   COLONOSCOPY W/ BIOPSIES AND POLYPECTOMY  X 1    CORONARY ANGIOPLASTY     EXCISION MASS NECK Left 01/08/2021   Procedure: EXCISION MASS NECK;  Surgeon: Christia Reading, MD;  Location: [facility];  Service: ENT;  Laterality: Left;   FEMUR IM NAIL Right 07/26/2013   Procedure: INTRAMEDULLARY (IM) NAIL FEMORAL subtrochanteric;  Surgeon: Shelda Pal, MD;  Location: [facility] OR;  Service: Orthopedics;  Laterality: Right;   FRACTURE SURGERY     INGUINAL HERNIA REPAIR Left >3 times   INGUINAL  HERNIA REPAIR Right 2012   NEPHRECTOMY Left 2000   TONSILLECTOMY  1940s    Family History  Problem Relation Age of Onset   Drug abuse Other    Cancer Other    Heart disease Other    Lung disease Other    Diabetes Mother    Diabetes Sister    Colon cancer Neg Hx    Stomach cancer Neg Hx     Allergies  Allergen Reactions   Bee Venom Anaphylaxis    Current Outpatient Medications on File Prior to Visit  Medication Sig Dispense Refill   aspirin 81 MG chewable tablet Chew 81 mg by mouth in the morning.     atorvastatin (LIPITOR) 40 MG tablet Take 1 tablet (40 mg total) by mouth daily at 6 PM. (Patient taking differently: Take 40 mg by mouth daily.) 30 tablet 3   Cholecalciferol (VITAMIN D-3) 125 MCG (5000 UT) TABS Take 5,000 Units by mouth in the morning.     Cyanocobalamin (VITAMIN B-12) 5000 MCG SUBL Place 5,000 mcg under the tongue every 3 (three) days.     cyclobenzaprine (FLEXERIL) 10 MG tablet Take 1 tablet (10 mg total) by mouth at bedtime. 15 tablet 0   ELIQUIS 5 MG TABS tablet Take 5 mg by mouth 2 (two) times daily.     EPINEPHrine 0.3 mg/0.3 mL IJ SOAJ injection Inject 0.3 mg into the muscle as needed for anaphylaxis.     gabapentin (NEURONTIN) 100 MG capsule TAKE 1 CAPSULE BY MOUTH AT  BEDTIME 90 capsule 3   glipiZIDE (GLUCOTROL) 5 MG tablet Take 1 tablet (5 mg total) by mouth 2 (two) times daily before a meal. 180 tablet 1   glucose blood (ONETOUCH VERIO) test strip USE 1 STRIP TO CHECK GLUCOSE ONCE DAILY 100 each 3   Insulin Pen Needle (PEN  NEEDLES) 32G X 4 MM MISC Use as instructed to administer weekly injection. 30 each 1   Insulin Syringes, Disposable, U-100 0.5 ML MISC Use 1x a day 100 each 3   metFORMIN (GLUCOPHAGE) 1000 MG tablet TAKE 1 TABLET BY MOUTH TWICE  DAILY WITH MEALS 200 tablet 2   metoprolol succinate (TOPROL-XL) 100 MG 24 hr tablet Take 100 mg by mouth in the morning and at bedtime.     Multiple Vitamins-Minerals (PRESERVISION AREDS 2 PO) Take 1 tablet by mouth in the morning and at bedtime.     nitroGLYCERIN (NITROSTAT) 0.4 MG SL tablet Place 1 tablet (0.4 mg total) under the tongue every 5 (five) minutes as needed for chest pain. 25 tablet 1   ONETOUCH DELICA LANCETS FINE MISC Use to check sugar daily 100 each 5   Semaglutide,0.25 or 0.5MG /DOS, 2 MG/3ML SOPN Inject 0.5 mg weekly under skin 9 mL 3   tamsulosin (FLOMAX) 0.4 MG CAPS capsule TAKE 1 CAPSULE BY MOUTH DAILY 100 capsule 2   No current facility-administered medications on file prior to visit.    BP 110/70   Pulse 76   Temp 97.9 F (36.6 C) (Oral)   Ht 5' 10.5" (1.791 m)   Wt 245 lb (111.1 kg)   SpO2 96%   BMI 34.66 kg/m       Objective:   Physical Exam Vitals and nursing note reviewed.  Constitutional:      Appearance: Normal appearance. He is obese.  Cardiovascular:     Rate and Rhythm: Normal rate and regular rhythm.     Pulses: Normal pulses.     Heart sounds: Normal heart sounds.  Pulmonary:     Effort: Pulmonary effort is normal.     Breath sounds: Normal breath sounds.  Musculoskeletal:     Left shoulder: Tenderness present. No swelling, deformity, bony tenderness or crepitus. Decreased range of motion. Normal strength. Normal pulse.  Skin:    General: Skin is warm and dry.     Findings: Bruising and wound present.       Neurological:     General: No focal deficit present.     Mental Status: He is alert and oriented to person, place, and time.  Psychiatric:        Mood and Affect: Mood normal.        Behavior: Behavior  normal.        Thought Content: Thought content normal.        Judgment: Judgment normal.        Assessment & Plan:  1. Gait instability - We discussed PT but he would like to hold off. He plans to go to [facility] to work on lower body strength.   2. Acute pain of left shoulder - Concern for rotator cuff tear. We will do Xray today and he would like to wait a few weeks to see if he improves further before doing MRI - DG Shoulder Left; Future  3. Skin tear of left forearm without complication, initial encounter - keep area clean and dry. Cover with bandage.  Shirline Frees, NP  Time spent with patient today was 32 minutes which consisted of chart review, discussing shoulder pain and gait instability work up, treatment answering questions and documentation.

## 2023-05-13 ENCOUNTER — Ambulatory Visit (INDEPENDENT_AMBULATORY_CARE_PROVIDER_SITE_OTHER): Payer: Medicare Other

## 2023-05-13 ENCOUNTER — Other Ambulatory Visit: Payer: Medicare Other

## 2023-05-13 DIAGNOSIS — M85812 Other specified disorders of bone density and structure, left shoulder: Secondary | ICD-10-CM | POA: Diagnosis not present

## 2023-05-13 DIAGNOSIS — M25512 Pain in left shoulder: Secondary | ICD-10-CM | POA: Diagnosis not present

## 2023-05-13 DIAGNOSIS — H40013 Open angle with borderline findings, low risk, bilateral: Secondary | ICD-10-CM | POA: Diagnosis not present

## 2023-05-13 DIAGNOSIS — M25712 Osteophyte, left shoulder: Secondary | ICD-10-CM | POA: Diagnosis not present

## 2023-05-13 DIAGNOSIS — M19012 Primary osteoarthritis, left shoulder: Secondary | ICD-10-CM | POA: Diagnosis not present

## 2023-05-16 ENCOUNTER — Encounter: Payer: Self-pay | Admitting: Adult Health

## 2023-05-19 ENCOUNTER — Encounter: Payer: Self-pay | Admitting: Internal Medicine

## 2023-05-19 ENCOUNTER — Ambulatory Visit: Payer: Medicare Other | Admitting: Internal Medicine

## 2023-05-19 VITALS — BP 120/80 | HR 74 | Ht 70.5 in | Wt 248.2 lb

## 2023-05-19 DIAGNOSIS — G6289 Other specified polyneuropathies: Secondary | ICD-10-CM

## 2023-05-19 DIAGNOSIS — Z7984 Long term (current) use of oral hypoglycemic drugs: Secondary | ICD-10-CM | POA: Diagnosis not present

## 2023-05-19 DIAGNOSIS — E538 Deficiency of other specified B group vitamins: Secondary | ICD-10-CM

## 2023-05-19 DIAGNOSIS — N182 Chronic kidney disease, stage 2 (mild): Secondary | ICD-10-CM

## 2023-05-19 DIAGNOSIS — R7989 Other specified abnormal findings of blood chemistry: Secondary | ICD-10-CM | POA: Diagnosis not present

## 2023-05-19 DIAGNOSIS — E1122 Type 2 diabetes mellitus with diabetic chronic kidney disease: Secondary | ICD-10-CM | POA: Diagnosis not present

## 2023-05-19 DIAGNOSIS — E785 Hyperlipidemia, unspecified: Secondary | ICD-10-CM | POA: Diagnosis not present

## 2023-05-19 DIAGNOSIS — E1165 Type 2 diabetes mellitus with hyperglycemia: Secondary | ICD-10-CM

## 2023-05-19 DIAGNOSIS — E1159 Type 2 diabetes mellitus with other circulatory complications: Secondary | ICD-10-CM | POA: Diagnosis not present

## 2023-05-19 LAB — POCT GLYCOSYLATED HEMOGLOBIN (HGB A1C): Hemoglobin A1C: 8.1 % — AB (ref 4.0–5.6)

## 2023-05-19 LAB — MICROALBUMIN / CREATININE URINE RATIO
Creatinine,U: 84.2 mg/dL
Microalb Creat Ratio: 41.8 mg/g — ABNORMAL HIGH (ref 0.0–30.0)
Microalb, Ur: 35.2 mg/dL — ABNORMAL HIGH (ref 0.0–1.9)

## 2023-05-19 LAB — VITAMIN B12: Vitamin B-12: 934 pg/mL — ABNORMAL HIGH (ref 211–911)

## 2023-05-19 MED ORDER — GLIPIZIDE ER 5 MG PO TB24: 5.0000 mg | ORAL_TABLET | Freq: Every day | ORAL | 3 refills | Status: DC

## 2023-05-19 MED ORDER — GLIPIZIDE 5 MG PO TABS: 5.0000 mg | ORAL_TABLET | Freq: Every day | ORAL | Status: DC

## 2023-05-19 NOTE — Progress Notes (Addendum)
Patient ID: BRANDIE BILOTTA, male   DOB: 04-10-1941, 82 y.o.   MRN: [id number]  HPI: ZACHARIAH STROUD is a 82 y.o.-year-old male, returning for f/u for DM2 dx 2008, uncontrolled, with complications (CAD - s/p NSTEMI, PN, mild CKD). Last visit 4 months ago.  Interim history: No increased urination, blurry vision, nausea, chest pain. He fell 3 weeks ago in [place] >> scraped skin on his L forearm and he has difficulty moving his arm.  He saw PCP and had a shoulder x-ray but this has not been read yet.  DM2: Reviewed HbA1c levels: Lab Results  Component Value Date   HGBA1C 7.8 (A) 01/14/2023   HGBA1C 7.7 (A) 09/13/2022   HGBA1C 5.9 (A) 05/08/2022  12/2013: 8.6%  Pt is on a regimen of: - Metformin 1000 mg 2x a day >> 2000 mg with dinner (but sugars worse) >> 1000 mg 2x a day - Glipizide 10 >> 5 mg 2x a day before meals >> 5 mg in a.m. and 5-10 mg before dinner >> 2.5 mg 2x a day before meals >> 1x a day before dinner >> after dinner  - stopped and was not able to restart. We tried to start Ozempic 0.25 mg weekly -08/2020 >> too expensive. In the past, I recommended  NPH 14 units at bedtime, but he did not start. We tried Rybelsus 3 >> 6 mg daily-added 12/2018 >> $$$ >> stopped 01/2019 He checked with his insurance and none of the SGLT2 inhibitors are covered for him.  Pt checks his sugars once a day: - am:  79, 105-139, 151 >> 110-181 >> 92-151, 158 - 2h after b'fast: 68x1 >> n/c >> 201, 206 >> n/c - before lunch:  n/c >> 69, 79 >> n/c >> 90 x1 >> n/c - 2h after lunch: 123-176 >> n/c >> 317 >> n/c - before dinner: n/c >> 97 >> 90s >> n/c >> 80 >> n/c - 2h after dinner: n/c >> 240 >> n/c - bedtime: 110-150 >> 160s >> n/c Lowest sugar was  50 >> 79 >> 100 >> 92; he has hypoglycemia awareness in the 80s. Highest sugar was 146 >> 200 >> 317  (forgot meds) >> 158  -+ Mild CKD, last BUN/creatinine:  Lab Results  Component Value Date   BUN 12 10/11/2022   CREATININE 0.96 10/11/2022   Lab  Results  Component Value Date   MICRALBCREAT 48.1 (H) 07/16/2022   MICRALBCREAT 9.7 08/08/2016   MICRALBCREAT 10.6 07/21/2015   MICRALBCREAT 25.8 04/06/2014   MICRALBCREAT 12.8 06/29/2013   MICRALBCREAT 5.3 03/15/2013   MICRALBCREAT 2.8 02/13/2012   MICRALBCREAT 11.4 12/10/2010   MICRALBCREAT 197.7 (H) 11/17/2009   MICRALBCREAT 112.4 (H) 11/11/2008  Of note, he has a history of nephrectomy. On lisinopril 10.  -+ HL; last set of lipids: Lab Results  Component Value Date   CHOL 115 09/13/2022   HDL 38.90 (L) 09/13/2022   LDLCALC 47 09/13/2022   LDLDIRECT 139.8 07/08/2014   TRIG 145.0 09/13/2022   CHOLHDL 3 09/13/2022  On Lipitor 40.  - last eye exam was 01/22/2023: No DR; now seeing Dr. Burundi and Dr. Lita Mains- retinal vein occlusion, + cataracts, + macular "hole".  He has a history of eye surgeries.  He gets intraocular injections  - his numbness and tingling in feet improved after starting B12. Foot exam 09/13/2022.  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  B12 deficiency: -Diagnosed during investigation for numbness and tingling (peripheral neuropathy) -He was initially on B12 injections, then on 5000 mcg daily -He was on 5000 mcg every 3 days, changed by PCP 09/2019 -We changed to 5000 mcg weekly 09/2022  Reviewed his B12 levels: Lab Results  Component Value Date   VITAMINB12 >1500 (H) 09/13/2022   VITAMINB12 832 10/09/2021   VITAMINB12 900 10/06/2020   VITAMINB12 748 12/22/2019   VITAMINB12 1,033 (H) 10/08/2019   VITAMINB12 1,172 (H) 08/19/2018   VITAMINB12 >1500 (H) 05/04/2018   VITAMINB12 1,249 (H) 02/18/2017   VITAMINB12 234 10/22/2016   VITAMINB12 217 04/19/2016   He was admitted with NSTEMI 10/18/2015.  He had stents placed then.  He was going to the gym 5 out of 7 days, but stopped during the coronavirus pandemic.  He  restarted to go to the gym afterwards.  ROS: + See HPI  I reviewed pt's medications, allergies, PMH, social hx, family hx, and changes were documented in the history of present illness. Otherwise, unchanged from my initial visit note.  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   Coronary artery disease    Dysrhythmia    PAF   Eczema    GERD (gastroesophageal reflux disease)    Hernia, ventral    History of blood transfusion    Hyperlipidemia    Hypertension    Myocardial infarction (HCC)    Neuropathy    Obesity    Personal history of adenomatous colonic polyps 07/20/2012   3 + adenomas 2009 07/20/2012 2 diminutive polyps     Pneumonia 1942; 1950s X 1   Type II diabetes mellitus (HCC)    type II   Walking pneumonia 2000's X 1   Past Surgical History:  Procedure Laterality Date   A-FLUTTER ABLATION N/A 07/12/2022   Procedure: A-FLUTTER ABLATION;  Surgeon: Maurice Small, MD;  Location: [facility] INVASIVE CV LAB;  Service: Cardiovascular;  Laterality: N/A;   ATRIAL FIBRILLATION ABLATION N/A 07/12/2022   Procedure: ATRIAL FIBRILLATION ABLATION;  Surgeon: Maurice Small, MD;  Location: [facility];  Service: Cardiovascular;  Laterality: N/A;   CARDIAC CATHETERIZATION N/A 10/19/2015   Procedure: Left Heart Cath and Coronary Angiography;  Surgeon: Rinaldo Cloud, MD;  Location: [facility] INVASIVE CV LAB;  Service: Cardiovascular;  Laterality: N/A;   CARDIAC CATHETERIZATION N/A 10/19/2015   Procedure: Coronary Stent Intervention;  Surgeon: Rinaldo Cloud, MD;  Location: [facility] INVASIVE CV LAB;  Service: Cardiovascular;  Laterality: N/A;   CATARACT EXTRACTION, BILATERAL Bilateral 01/2018   Dr. Dagoberto Ligas did surgery and he developed MD and is seeing specialist now for MD treatment   COLONOSCOPY     "nothing  showed up this time"   COLONOSCOPY W/ BIOPSIES AND POLYPECTOMY  X 1   CORONARY ANGIOPLASTY     EXCISION MASS NECK Left 01/08/2021   Procedure: EXCISION MASS NECK;  Surgeon: Christia Reading, MD;  Location[facility];  Service: ENT;  Laterality: Left;   FEMUR IM NAIL Right 07/26/2013   Procedure: INTRAMEDULLARY (IM) NAIL FEMORAL subtrochanteric;  Surgeon: Shelda Pal, MD;  Location: [facility] OR;  Service: Orthopedics;  Laterality: Right;   FRACTURE SURGERY     INGUINAL HERNIA  REPAIR Left >3 times   INGUINAL HERNIA REPAIR Right 2012   NEPHRECTOMY Left 2000   TONSILLECTOMY  1940s   Social History   Socioeconomic History   Marital status: Widowed    Spouse name: Not on file   Number of children: 3   Years of education: Not on file   Highest education level: Master's degree (e.g., MA, MS, MEng, MEd, MSW, MBA)  Occupational History   Occupation: retired  Tobacco Use   Smoking status: Former    Current packs/day: 0.00    Types: Cigarettes, Pipe, Cigars    Start date: 09/09/1948    Quit date: 09/09/1968    Years since quitting: 54.7   Smokeless tobacco: Never   Tobacco comments:    smoking cigars- quit in 2006  , quit pipe 2000  Vaping Use   Vaping status: Never Used  Substance and Sexual Activity   Alcohol use: Not Currently    Alcohol/week: 2.0 standard drinks of alcohol    Types: 2 Glasses of wine per week    Comment: 2 glasses on Tuesday   Drug use: No   Sexual activity: Never  Other Topics Concern   Not on file  Social History Narrative   Regular exercise: goes to the [facility] and walks   Caffeine use: daily; coffee   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     widowed    Three children, One in [place], one in [place] one in [place]   [facility] - 2 : youngest daughter lives with him   Social Determinants of Health   Financial Resource Strain: Low Risk  (05/08/2023)   Overall Financial Resource Strain (CARDIA)    Difficulty of Paying Living Expenses: Not hard at all  Food  Insecurity: No Food Insecurity (05/08/2023)   Hunger Vital Sign    Worried About Running Out of Food in the Last Year: Never true    Ran Out of Food in the Last Year: Never true  Transportation Needs: No Transportation Needs (05/08/2023)   PRAPARE - Administrator, Civil Service (Medical): No    Lack of Transportation (Non-Medical): No  Physical Activity: Sufficiently Active (05/08/2023)   Exercise Vital Sign    Days of Exercise per Week: 5 days    Minutes of Exercise per Session: 30 min  Stress: No Stress Concern Present (05/08/2023)   [employer] of Occupational Health - Occupational Stress Questionnaire    Feeling of Stress : Not at all  Social Connections: Moderately Integrated (05/08/2023)   Social Connection and Isolation Panel [NHANES]    Frequency of Communication with Friends and Family: More than three times a week    Frequency of Social Gatherings with Friends and Family: More than three times a week    Attends Religious Services: More than 4 times per year    Active Member of [employer] or Organizations: Yes    Attends Banker Meetings: Never    Marital Status: Widowed  Intimate Partner Violence: Not At Risk (01/01/2023)   Humiliation, Afraid, Rape, and Kick questionnaire    Fear of Current or Ex-Partner: No    Emotionally Abused: No    Physically Abused: No    Sexually Abused: No   Current Outpatient Medications on File Prior to Visit  Medication Sig Dispense Refill   aspirin 81 MG chewable tablet Chew 81 mg by mouth in the morning.     atorvastatin (LIPITOR) 40 MG tablet Take 1 tablet (40 mg total) by  mouth daily at 6 PM. (Patient taking differently: Take 40 mg by mouth daily.) 30 tablet 3   Cholecalciferol (VITAMIN D-3) 125 MCG (5000 UT) TABS Take 5,000 Units by mouth in the morning.     Cyanocobalamin (VITAMIN B-12) 5000 MCG SUBL Place 5,000 mcg under the tongue every 3 (three) days.     cyclobenzaprine (FLEXERIL) 10 MG tablet Take 1 tablet (10  mg total) by mouth at bedtime. 15 tablet 0   ELIQUIS 5 MG TABS tablet Take 5 mg by mouth 2 (two) times daily.     EPINEPHrine 0.3 mg/0.3 mL IJ SOAJ injection Inject 0.3 mg into the muscle as needed for anaphylaxis.     gabapentin (NEURONTIN) 100 MG capsule TAKE 1 CAPSULE BY MOUTH AT  BEDTIME 90 capsule 3   glipiZIDE (GLUCOTROL) 5 MG tablet Take 1 tablet (5 mg total) by mouth 2 (two) times daily before a meal. 180 tablet 1   glucose blood (ONETOUCH VERIO) test strip USE 1 STRIP TO CHECK GLUCOSE ONCE DAILY 100 each 3   Insulin Pen Needle (PEN NEEDLES) 32G X 4 MM MISC Use as instructed to administer weekly injection. 30 each 1   Insulin Syringes, Disposable, U-100 0.5 ML MISC Use 1x a day 100 each 3   metFORMIN (GLUCOPHAGE) 1000 MG tablet TAKE 1 TABLET BY MOUTH TWICE  DAILY WITH MEALS 200 tablet 2   metoprolol succinate (TOPROL-XL) 100 MG 24 hr tablet Take 100 mg by mouth in the morning and at bedtime.     Multiple Vitamins-Minerals (PRESERVISION AREDS 2 PO) Take 1 tablet by mouth in the morning and at bedtime.     nitroGLYCERIN (NITROSTAT) 0.4 MG SL tablet Place 1 tablet (0.4 mg total) under the tongue every 5 (five) minutes as needed for chest pain. 25 tablet 1   ONETOUCH DELICA LANCETS FINE MISC Use to check sugar daily 100 each 5   Semaglutide,0.25 or 0.5MG /DOS, 2 MG/3ML SOPN Inject 0.5 mg weekly under skin 9 mL 3   tamsulosin (FLOMAX) 0.4 MG CAPS capsule TAKE 1 CAPSULE BY MOUTH DAILY 100 capsule 2   No current facility-administered medications on file prior to visit.   Allergies  Allergen Reactions   Bee Venom Anaphylaxis   Family History  Problem Relation Age of Onset   Drug abuse Other    Cancer Other    Heart disease Other    Lung disease Other    Diabetes Mother    Diabetes Sister    Colon cancer Neg Hx    Stomach cancer Neg Hx    PE: BP 120/80   Pulse 74   Ht 5' 10.5" (1.791 m)   Wt 248 lb 3.2 oz (112.6 kg)   SpO2 97%   BMI 35.11 kg/m  Wt Readings from Last 3  Encounters:  05/19/23 248 lb 3.2 oz (112.6 kg)  05/09/23 245 lb (111.1 kg)  01/14/23 255 lb 3.2 oz (115.8 kg)   Constitutional: overweight, in NAD Eyes: EOMI, no exophthalmos ENT: no thyromegaly, no cervical lymphadenopathy Cardiovascular: RRR, No MRG Respiratory: CTA B Musculoskeletal: no deformities Skin:  no rashes, significant scar with thick crust on left forearm Neurological: no tremor with outstretched hands  ASSESSMENT: 1. DM2, not insulin-dependent, uncontrolled, with complications - CAD - s/p NSTEMI (Dr. Sharyn Lull) - peripheral neuropathy - CKD   He does not have a family history of medullary thyroid cancer or personal history of pancreatitis.   2. B12 def  3.  Peripheral neuropathy  4. HL  5.  History of elevated TSH -In the setting of amiodarone treatment  PLAN:  1. Patient with longstanding, uncontrolled, type 2 diabetes, previously with significantly improved control after adding Ozempic, but off the medication at last visit due to price.  At that time, he was only on metformin and sulfonylurea.  He was taking glipizide after dinner, and sugars were uncontrolled at that time.  We discussed about the need to optimize his regimen.  I did suggest again to try Ozempic to see if it had better coverage in the new year, with new data about showing improvement in cardiovascular outcomes with GLP-1 receptor agonist.  I did recommend to take extended release glipizide in the morning if he was not able to obtain Ozempic.  At last visit, HbA1c was higher, at 7.8%. -At today's visit, he mentions that he was not able to start Ozempic since last visit.  He is taking glipizide IR at night before or after dinner.  We discussed that this should always be taken before dinner to avoid hypoglycemia at night.  He did not let me know that he was not able to start Ozempic so we were not able to start the glipizide ER before breakfast, as discussed.  At today's visit we discussed about adding  this and I recommended to start with 5 mg before breakfast and increase to 10 mg daily if needed.  Will continue the rest of the regimen for now. - I advised him to: Patient Instructions  Please continue: - Metformin 1000 mg 2x a day with meals - Glipizide 5 mg before dinner  Start Glipizide ER 5-10 mg before b'fast.  Please check some sugars later in the day, also.   Please return in 4 months with your sugar log.   - we checked his HbA1c: 8.1% (higher) - advised to check sugars at different times of the day - 1x a day, rotating check times - advised for yearly eye exams >> he is UTD - return to clinic in 4 months   2.  And 3. Peripheral neuropathy and B12 deficiency -He is peripheral neuropathy probably related to both diabetes and B12 deficiency -We started B12 supplementation, which we were then able to reduce to 5000 mcg weekly -Will recheck the B12 level now  4. HL -Reviewed latest lipid panel from 09/2022: Fractions at goal with the exception of a slightly low HDL: Lab Results  Component Value Date   CHOL 115 09/13/2022   HDL 38.90 (L) 09/13/2022   LDLCALC 47 09/13/2022   LDLDIRECT 139.8 07/08/2014   TRIG 145.0 09/13/2022   CHOLHDL 3 09/13/2022  -He is on Lipitor 40 mg daily without side effects  5.  High TSH -He had an elevated TSH (9.93) in the past while on amiodarone, but he stopped amiodarone since -At last check, TSH was normal: Lab Results  Component Value Date   TSH 4.59 09/13/2022  -No signs or symptoms of hypothyroidism -Will continue to follow him expectantly  Component     Latest Ref Rng 05/19/2023  Vitamin B12     211 - 911 pg/mL 934 (H)   Hemoglobin A1C     4.0 - 5.6 % 8.1 !   Microalb, Ur     0.0 - 1.9 mg/dL 16.0 (H)   Creatinine,U     mg/dL 10.9   MICROALB/CREAT RATIO     0.0 - 30.0 mg/g 41.8 (H)   ACR is slightly better, but still elevated.  I do not see lisinopril on his medication  list.  I will check with him if he stopped it and if so,  we may need to restart this. Vitamin B12 is close upper limit of the target range.  We can continue the same dose of B12 for now.  Carlus Pavlov, MD PhD [facility] Endocr[facility]

## 2023-05-19 NOTE — Patient Instructions (Addendum)
Please continue: - Metformin 1000 mg 2x a day with meals - Glipizide 5 mg before dinner  Start Glipizide ER 5-10 mg before b'fast.  Please check some sugars later in the day, also.   Please return in 4 months with your sugar log.

## 2023-05-19 NOTE — Addendum Note (Signed)
Addended by: Pollie Meyer on: 05/19/2023 11:21 AM   Modules accepted: Orders

## 2023-05-20 ENCOUNTER — Encounter: Payer: Self-pay | Admitting: Internal Medicine

## 2023-05-20 NOTE — Telephone Encounter (Signed)
Please advise 

## 2023-05-22 DIAGNOSIS — L814 Other melanin hyperpigmentation: Secondary | ICD-10-CM | POA: Diagnosis not present

## 2023-05-22 DIAGNOSIS — D692 Other nonthrombocytopenic purpura: Secondary | ICD-10-CM | POA: Diagnosis not present

## 2023-05-22 DIAGNOSIS — L821 Other seborrheic keratosis: Secondary | ICD-10-CM | POA: Diagnosis not present

## 2023-05-22 DIAGNOSIS — D225 Melanocytic nevi of trunk: Secondary | ICD-10-CM | POA: Diagnosis not present

## 2023-05-22 DIAGNOSIS — L570 Actinic keratosis: Secondary | ICD-10-CM | POA: Diagnosis not present

## 2023-05-22 DIAGNOSIS — Z85828 Personal history of other malignant neoplasm of skin: Secondary | ICD-10-CM | POA: Diagnosis not present

## 2023-05-22 DIAGNOSIS — L905 Scar conditions and fibrosis of skin: Secondary | ICD-10-CM | POA: Diagnosis not present

## 2023-05-22 NOTE — Telephone Encounter (Signed)
Please advise 

## 2023-05-23 ENCOUNTER — Other Ambulatory Visit: Payer: Self-pay | Admitting: Adult Health

## 2023-05-23 DIAGNOSIS — M85812 Other specified disorders of bone density and structure, left shoulder: Secondary | ICD-10-CM

## 2023-05-23 DIAGNOSIS — M25512 Pain in left shoulder: Secondary | ICD-10-CM

## 2023-05-25 ENCOUNTER — Ambulatory Visit
Admission: RE | Admit: 2023-05-25 | Discharge: 2023-05-25 | Disposition: A | Discharge status: Home / Self Care | Payer: Medicare Other | Source: Ambulatory Visit | Attending: Adult Health | Admitting: Adult Health

## 2023-05-25 DIAGNOSIS — M25512 Pain in left shoulder: Secondary | ICD-10-CM | POA: Diagnosis not present

## 2023-05-25 DIAGNOSIS — S46012A Strain of muscle(s) and tendon(s) of the rotator cuff of left shoulder, initial encounter: Secondary | ICD-10-CM | POA: Diagnosis not present

## 2023-05-25 DIAGNOSIS — M7582 Other shoulder lesions, left shoulder: Secondary | ICD-10-CM | POA: Diagnosis not present

## 2023-05-29 DIAGNOSIS — H35033 Hypertensive retinopathy, bilateral: Secondary | ICD-10-CM | POA: Diagnosis not present

## 2023-05-29 DIAGNOSIS — H353133 Nonexudative age-related macular degeneration, bilateral, advanced atrophic without subfoveal involvement: Secondary | ICD-10-CM | POA: Diagnosis not present

## 2023-05-29 DIAGNOSIS — H34811 Central retinal vein occlusion, right eye, with macular edema: Secondary | ICD-10-CM | POA: Diagnosis not present

## 2023-05-29 DIAGNOSIS — H35372 Puckering of macula, left eye: Secondary | ICD-10-CM | POA: Diagnosis not present

## 2023-05-29 DIAGNOSIS — E113293 Type 2 diabetes mellitus with mild nonproliferative diabetic retinopathy without macular edema, bilateral: Secondary | ICD-10-CM | POA: Diagnosis not present

## 2023-05-29 DIAGNOSIS — H43812 Vitreous degeneration, left eye: Secondary | ICD-10-CM | POA: Diagnosis not present

## 2023-06-01 ENCOUNTER — Other Ambulatory Visit: Payer: Self-pay | Admitting: Adult Health

## 2023-06-01 DIAGNOSIS — N401 Enlarged prostate with lower urinary tract symptoms: Secondary | ICD-10-CM

## 2023-06-02 ENCOUNTER — Encounter: Payer: Self-pay | Admitting: Adult Health

## 2023-06-02 DIAGNOSIS — M75100 Unspecified rotator cuff tear or rupture of unspecified shoulder, not specified as traumatic: Secondary | ICD-10-CM

## 2023-06-06 NOTE — Telephone Encounter (Signed)
Spoke to pt and advised on some of his labs. However, pt was in the store was having a hard time hearing him. I advised that I will call him back.

## 2023-06-06 NOTE — Addendum Note (Signed)
Addended by: Waymon Amato R on: 06/06/2023 04:16 PM   Modules accepted: Orders

## 2023-06-12 ENCOUNTER — Encounter: Payer: Self-pay | Admitting: Adult Health

## 2023-06-12 ENCOUNTER — Ambulatory Visit: Payer: Medicare Other | Admitting: Adult Health

## 2023-06-12 DIAGNOSIS — M12812 Other specific arthropathies, not elsewhere classified, left shoulder: Secondary | ICD-10-CM | POA: Diagnosis not present

## 2023-06-12 NOTE — Telephone Encounter (Signed)
Pt called, returning CMA's call. CMA was unavailable. Pt asked that CMA call back at their earliest convenience. 

## 2023-06-12 NOTE — Progress Notes (Signed)
Virtual Visit via Video Note  I connected with Charles [name] on 06/12/23 at 11:45 AM EDT by a video enabled telemedicine application and verified that I am speaking with the correct person using two identifiers.  Location patient: home Location provider:work or home office Persons participating in the virtual visit: patient, provider  I discussed the limitations of evaluation and management by telemedicine and the availability of in person appointments. The patient expressed understanding and agreed to proceed.   HPI: 82 year old male who had a recent MRI which showed  1. 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 2. Moderate tendinosis of the infraspinatus tendon with a full-thickness tear of the anterior half of the tendon and an interstitial tear at the musculotendinous junction. 3. Mild tendinosis of the intra-articular portion of the long head of the biceps tendon.  Has an appointment with orthopedics next week but is concerned about the surgery.  He has heard that recovery is painful and long and he may not gain full recovery of his left shoulder.  He does report that the inflammation has improved and he is having less pain but does continue to have issues with grip strength.   ROS: See pertinent positives and negatives per HPI.  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   Coronary artery disease    Dysrhythmia    PAF   Eczema    GERD (gastroesophageal reflux disease)    Hernia, ventral    History of blood transfusion    Hyperlipidemia    Hypertension    Myocardial infarction (HCC)    Neuropathy    Obesity    Personal history of adenomatous colonic polyps 07/20/2012   3 + adenomas 2009 07/20/2012 2 diminutive polyps     Pneumonia 1942; 1950s X 1   Type II  diabetes mellitus (HCC)    type II   Walking pneumonia 2000's X 1    Past Surgical History:  Procedure Laterality Date   A-FLUTTER ABLATION N/A 07/12/2022   Procedure: A-FLUTTER ABLATION;  Surgeon: Maurice Small, MD;  Location: [facility];  Service: Cardiovascular;  Laterality: N/A;   ATRIAL FIBRILLATION ABLATION N/A 07/12/2022   Procedure: ATRIAL FIBRILLATION ABLATION;  Surgeon: Maurice Small, MD;  Location: [facility];  Service: Cardiovascular;  Laterality: N/A;   CARDIAC CATHETERIZATION N/A 10/19/2015   Procedure: Left Heart Cath and Coronary Angiography;  Surgeon: Rinaldo Cloud, MD;  Location: [facility] INVASIVE CV LAB;  Service: Cardiovascular;  Laterality: N/A;   CARDIAC CATHETERIZATION N/A 10/19/2015   Procedure: Coronary Stent Intervention;  Surgeon: Rinaldo Cloud, MD;  Location: [facility];  Service: Cardiovascular;  Laterality: N/A;   CATARACT EXTRACTION, BILATERAL Bilateral 01/2018   Dr. Dagoberto Ligas did surgery and he developed MD and is seeing specialist now for MD treatment   COLONOSCOPY     "nothing showed up this time"   COLONOSCOPY W/ BIOPSIES AND POLYPECTOMY  X 1   CORONARY ANGIOPLASTY     EXCISION MASS NECK Left 01/08/2021   Procedure: EXCISION MASS NECK;  Surgeon: Christia Reading, MD;  Location: [facility] OR;  Service: ENT;  Laterality: Left;   FEMUR IM NAIL Right 07/26/2013   Procedure: INTRAMEDULLARY (IM) NAIL FEMORAL subtrochanteric;  Surgeon: Shelda Pal, MD;  Location: [facility] OR;  Service: Orthopedics;  Laterality: Right;   FRACTURE SURGERY     INGUINAL HERNIA REPAIR Left >3 times   INGUINAL HERNIA REPAIR Right 2012   NEPHRECTOMY Left 2000   TONSILLECTOMY  1940s    Family History  Problem Relation Age of Onset   Drug abuse Other    Cancer Other    Heart disease Other    Lung disease Other    Diabetes Mother    Diabetes Sister    Colon cancer Neg Hx    Stomach cancer Neg Hx        Current Outpatient Medications:    aspirin 81 MG chewable  tablet, Chew 81 mg by mouth in the morning., Disp: , Rfl:    atorvastatin (LIPITOR) 40 MG tablet, Take 1 tablet (40 mg total) by mouth daily at 6 PM. (Patient taking differently: Take 40 mg by mouth daily.), Disp: 30 tablet, Rfl: 3   Cholecalciferol (VITAMIN D-3) 125 MCG (5000 UT) TABS, Take 5,000 Units by mouth in the morning., Disp: , Rfl:    Cyanocobalamin (VITAMIN B-12) 5000 MCG SUBL, Place 5,000 mcg under the tongue every 3 (three) days., Disp: , Rfl:    cyclobenzaprine (FLEXERIL) 10 MG tablet, Take 1 tablet (10 mg total) by mouth at bedtime., Disp: 15 tablet, Rfl: 0   ELIQUIS 5 MG TABS tablet, Take 5 mg by mouth 2 (two) times daily., Disp: , Rfl:    EPINEPHrine 0.3 mg/0.3 mL IJ SOAJ injection, Inject 0.3 mg into the muscle as needed for anaphylaxis., Disp: , Rfl:    gabapentin (NEURONTIN) 100 MG capsule, TAKE 1 CAPSULE BY MOUTH AT  BEDTIME, Disp: 90 capsule, Rfl: 3   glipiZIDE (GLUCOTROL XL) 5 MG 24 hr tablet, Take 1-2 tablets (5-10 mg total) by mouth daily before breakfast., Disp: 180 tablet, Rfl: 3   glipiZIDE (GLUCOTROL) 5 MG tablet, Take 1 tablet (5 mg total) by mouth daily before supper., Disp: , Rfl:    glucose blood (ONETOUCH VERIO) test strip, USE 1 STRIP TO CHECK GLUCOSE ONCE DAILY, Disp: 100 each, Rfl: 3   Insulin Pen Needle (PEN NEEDLES) 32G X 4 MM MISC, Use as instructed to administer weekly injection., Disp: 30 each, Rfl: 1   Insulin Syringes, Disposable, U-100 0.5 ML MISC, Use 1x a day, Disp: 100 each, Rfl: 3   metFORMIN (GLUCOPHAGE) 1000 MG tablet, TAKE 1 TABLET BY MOUTH TWICE  DAILY WITH MEALS, Disp: 200 tablet, Rfl: 2   metoprolol succinate (TOPROL-XL) 100 MG 24 hr tablet, Take 100 mg by mouth in the morning and at bedtime., Disp: , Rfl:    Multiple Vitamins-Minerals (PRESERVISION AREDS 2 PO), Take 1 tablet by mouth in the morning and at bedtime., Disp: , Rfl:    nitroGLYCERIN (NITROSTAT) 0.4 MG SL tablet, Place 1 tablet (0.4 mg total) under the tongue every 5 (five) minutes as  needed for chest pain., Disp: 25 tablet, Rfl: 1   ONETOUCH DELICA LANCETS FINE MISC, Use to check sugar daily, Disp: 100 each, Rfl: 5   Semaglutide,0.25 or 0.5MG /DOS, 2 MG/3ML SOPN, Inject 0.5 mg weekly under skin, Disp: 9 mL, Rfl: 3   tamsulosin (FLOMAX) 0.4 MG CAPS capsule, TAKE 1 CAPSULE BY MOUTH DAILY, Disp: 100 capsule, Rfl: 2  EXAM:  VITALS per patient if applicable:  GENERAL: alert, oriented, appears well and in no acute distress  HEENT: atraumatic, conjunttiva clear, no obvious abnormalities on inspection of external nose and ears  NECK:  normal movements of the head and neck  LUNGS: on inspection no signs of respiratory distress, breathing rate appears normal, no obvious gross SOB, gasping or wheezing  CV: no obvious cyanosis  MS: moves all visible extremities without noticeable abnormality  PSYCH/NEURO: pleasant and cooperative, no obvious depression or anxiety, speech and thought processing grossly intact  ASSESSMENT AND PLAN:  Discussed the following assessment and plan:  1. Rotator cuff arthropathy of left shoulder -Answered his questions to the best of my ability but also advised him that these questions would be better answered by an orthopedic surgeon that does rotator cuff surgery.    I discussed the assessment and treatment plan with the patient. The patient was provided an opportunity to ask questions and all were answered. The patient agreed with the plan and demonstrated an understanding of the instructions.   The patient was advised to call back or seek an in-person evaluation if the symptoms worsen or if the condition fails to improve as anticipated.   Shirline Frees, NP

## 2023-06-12 NOTE — Telephone Encounter (Signed)
Pt notified of update.  

## 2023-06-19 DIAGNOSIS — M25512 Pain in left shoulder: Secondary | ICD-10-CM | POA: Diagnosis not present

## 2023-06-27 DIAGNOSIS — R531 Weakness: Secondary | ICD-10-CM | POA: Diagnosis not present

## 2023-06-27 DIAGNOSIS — M75122 Complete rotator cuff tear or rupture of left shoulder, not specified as traumatic: Secondary | ICD-10-CM | POA: Diagnosis not present

## 2023-06-27 DIAGNOSIS — M25512 Pain in left shoulder: Secondary | ICD-10-CM | POA: Diagnosis not present

## 2023-07-07 ENCOUNTER — Other Ambulatory Visit (INDEPENDENT_AMBULATORY_CARE_PROVIDER_SITE_OTHER): Payer: Medicare Other

## 2023-07-07 DIAGNOSIS — E1159 Type 2 diabetes mellitus with other circulatory complications: Secondary | ICD-10-CM

## 2023-07-07 NOTE — Progress Notes (Signed)
07/07/2023 Name: Charles [name] MRN: [id number] DOB: 1941/01/16  Chief Complaint  Patient presents with   Diabetes   Hypertension    Charles [name] is a 82 y.o. year old male who presented for a telephone visit.   They were referred to the pharmacist by their PCP for assistance in managing diabetes, hypertension, and complex medication management.    Subjective:  Care Team: Primary Care Provider: Shirline Frees, NP  Medication Access/Adherence  Current Pharmacy:  [facility]) - [place], [place] - [address] [facility] [address] [place] [address] [place] [address] Phone: [phone] Fax: [phone]  [facility] [address] [place]), [place] - [address] [place] [address] Phone: [phone] Fax: [phone]  [facility] Delivery - [place], [place] - [address] [place] [address] Phone: [phone] Fax: [phone]   Patient reports affordability concerns with their medications: No  Patient reports access/transportation concerns to their pharmacy: No  Patient reports adherence concerns with their medications:  Yes  -never started Glipizide XL, states pharmacy needed clarification that they never received (patient did not notify Dr. Elvera Lennox or PCP of concern   Diabetes:  Current medications: Glipizide XL 5mg  (1-2 tabs with breakfast)-never started, see above, Glipizide 5mg  1 tab at dinner, Ozempic 0.5mg , Metformin 1000mg  BID Medications tried in the past: None  Current glucose readings: 140s in the morning, denies anything below for 100 for a long time Using OneTouch Verio meter; testing 1 time daily - fasting   Patient denies hypoglycemic s/sx including dizziness, shakiness, sweating. Patient denies hyperglycemic symptoms including polyuria, polydipsia, polyphagia, nocturia, neuropathy, blurred vision.  Current meal patterns:  - Breakfast:  can vary, can be toast or bowl of cereal, eggs 1-2x/week - Lunch usually go out with a friend, country food  - Supper at home, varies - Snacks frequently, halloween candy right now, chips - Drinks coffee with sweetener  Current physical activity: goes to the [employer]  Current medication access support: None  Hypertension:  Current medications: Metoprolol XL 100mg  BID Medications previously tried: Lisinopril, Spironolactone  Patient does not have a validated, automated, upper arm home [facility] cuff[facility] Current blood pressure readings readings: 120/60  Patient denies hypotensive s/sx including dizziness, lightheadedness.  Patient denies hypertensive symptoms including headache, chest pain, shortness of breath  Current meal patterns: see above  Current physical activity: see above   Objective:  Lab Results  Component Value Date   HGBA1C 8.1 (A) 05/19/2023    Lab Results  Component Value Date   CREATININE 0.96 10/11/2022   BUN 12 10/11/2022   NA 143 10/11/2022   K 4.6 10/11/2022   CL 103 10/11/2022   CO2 29 10/11/2022    Lab Results  Component Value Date   CHOL 115 09/13/2022   HDL 38.90 (L) 09/13/2022   LDLCALC 47 09/13/2022   LDLDIRECT 139.8 07/08/2014   TRIG 145.0 09/13/2022   CHOLHDL 3 09/13/2022    Medications Reviewed Today     Reviewed by Sherrill Raring, RPH (Pharmacist) on 07/07/23 at 0959  Med List Status: <None>   Medication Order Taking? Sig Documenting Provider Last Dose Status Informant  aspirin 81 MG chewable tablet [id number] Yes Chew 81 mg by mouth in the morning. [provider] Taking Active Self  atorvastatin (LIPITOR) 40 MG tablet [id number] Yes Take 1 tablet (40 mg total) by mouth daily  at 6 PM.  Patient taking differently: Take 40 mg by mouth daily.   Orpah Cobb, MD Taking Active Self  Cholecalciferol (VITAMIN D-3) 125 MCG (5000 UT) TABS [id number] Yes Take 5,000 Units by mouth in the morning. [provider]  Taking Active Self  Cyanocobalamin (VITAMIN B-12) 5000 MCG SUBL [id number] Yes Place 5,000 mcg under the tongue every 3 (three) days. [provider] Taking Active Self           Med Note Barnie Mort Jul 07, 2023  9:31 AM) Once a week  cyclobenzaprine (FLEXERIL) 10 MG tablet [id number] Yes Take 1 tablet (10 mg total) by mouth at bedtime. Nafziger, Kandee Keen, NP Taking Active   ELIQUIS 5 MG TABS tablet [id number] Yes Take 5 mg by mouth 2 (two) times daily. [provider] Taking Active Self  EPINEPHrine 0.3 mg/0.3 mL IJ SOAJ injection [id number]  Inject 0.3 mg into the muscle as needed for anaphylaxis. [provider]  Active Self           Med Note Alvina Chou   Fri Jul 12, 2022  6:06 AM) Used over 10 years ago.  gabapentin (NEURONTIN) 100 MG capsule [id number] No TAKE 1 CAPSULE BY MOUTH AT  BEDTIME  Patient not taking: Reported on 07/07/2023   Judi Saa, DO Not Taking Active Self  glipiZIDE (GLUCOTROL XL) 5 MG 24 hr tablet [id number] No Take 1-2 tablets (5-10 mg total) by mouth daily before breakfast.  Patient not taking: Reported on 07/07/2023   Carlus Pavlov, MD Not Taking Active            Med Note Barnie Mort Jul 07, 2023  9:33 AM) Never received from pharmacy  glipiZIDE (GLUCOTROL) 5 MG tablet [id number] Yes Take 1 tablet (5 mg total) by mouth daily before supper. Carlus Pavlov, MD Taking Active   glucose blood [facility] VERIO) test strip [id number]  USE 1 STRIP TO CHECK GLUCOSE ONCE DAILY Carlus Pavlov, MD  Active   Insulin Pen Needle (PEN NEEDLES) 32G X 4 MM MISC [id number]  Use as instructed to administer weekly injection. Carlus Pavlov, MD  Active Self  Insulin Syringes, Disposable, U-100 0.5 ML MISC [id number]  Use 1x a day Carlus Pavlov, MD  Active Self  metFORMIN (GLUCOPHAGE) 1000 MG tablet [id number] Yes TAKE 1 TABLET BY MOUTH TWICE  DAILY WITH MEALS Carlus Pavlov, MD Taking Active   metoprolol succinate  (TOPROL-XL) 100 MG 24 hr tablet [id number] Yes Take 100 mg by mouth in the morning and at bedtime. [provider] Taking Active Self           Med Note Tiburcio Pea, Jackie Plum Dec 27, 2020  8:38 AM)    Multiple Vitamins-Minerals (PRESERVISION AREDS 2 PO) [id number] Yes Take 1 tablet by mouth in the morning and at bedtime. [provider] Taking Active Self  nitroGLYCERIN (NITROSTAT) 0.4 MG SL tablet [id number]  Place 1 tablet (0.4 mg total) under the tongue every 5 (five) minutes as needed for chest pain. Orpah Cobb, MD  Active Self           Med Note Alvina Chou   Fri Jul 12, 2022  6:07 AM) Never used as of 07/12/22.  [facility] DELICA LANCETS FINE MISC [id number]  Use to check sugar daily Carlus Pavlov, MD  Active Self  tamsulosin (FLOMAX) 0.4 MG CAPS capsule [id number] Yes TAKE 1 CAPSULE BY MOUTH DAILY Nafziger, Kandee Keen, NP Taking Active  Assessment/Plan:   Diabetes: - Currently uncontrolled - Reviewed long term cardiovascular and renal outcomes of uncontrolled blood sugar - Reviewed goal A1c, goal fasting, and goal 2 hour post prandial glucose - Reviewed dietary modifications including low carb diets and eating regular meals - Reviewed [facility] including[facility] - Recommend to start Glipizide XL 5mg  with breakfast as previously prescribed by Dr. Elvera Lennox, provided prescription clarification to [facility] and they are filling today  - Recommend to check glucose once daily at varying times (fasting and post-prandial, keep a log --Reviewed patient finances, does not qualify for PAP to try and restart Ozempic     Hypertension: - Currently controlled - Reviewed long term cardiovascular and renal outcomes of uncontrolled blood pressure - Reviewed appropriate blood pressure monitoring technique and reviewed goal blood pressure. Recommended to check home blood pressure and heart rate once weekly - Recommend to continue  current medication therapy     Follow Up Plan: 1 month  Sherrill Raring, PharmD Clinical Pharmacist [phone]

## 2023-07-18 DIAGNOSIS — M75122 Complete rotator cuff tear or rupture of left shoulder, not specified as traumatic: Secondary | ICD-10-CM | POA: Diagnosis not present

## 2023-07-18 DIAGNOSIS — R531 Weakness: Secondary | ICD-10-CM | POA: Diagnosis not present

## 2023-07-18 DIAGNOSIS — M25512 Pain in left shoulder: Secondary | ICD-10-CM | POA: Diagnosis not present

## 2023-07-24 DIAGNOSIS — E113293 Type 2 diabetes mellitus with mild nonproliferative diabetic retinopathy without macular edema, bilateral: Secondary | ICD-10-CM | POA: Diagnosis not present

## 2023-07-24 DIAGNOSIS — H353133 Nonexudative age-related macular degeneration, bilateral, advanced atrophic without subfoveal involvement: Secondary | ICD-10-CM | POA: Diagnosis not present

## 2023-07-24 DIAGNOSIS — H35372 Puckering of macula, left eye: Secondary | ICD-10-CM | POA: Diagnosis not present

## 2023-07-24 DIAGNOSIS — H43812 Vitreous degeneration, left eye: Secondary | ICD-10-CM | POA: Diagnosis not present

## 2023-07-24 DIAGNOSIS — H34811 Central retinal vein occlusion, right eye, with macular edema: Secondary | ICD-10-CM | POA: Diagnosis not present

## 2023-07-24 DIAGNOSIS — H35033 Hypertensive retinopathy, bilateral: Secondary | ICD-10-CM | POA: Diagnosis not present

## 2023-08-11 ENCOUNTER — Other Ambulatory Visit (INDEPENDENT_AMBULATORY_CARE_PROVIDER_SITE_OTHER): Payer: Medicare Other

## 2023-08-11 DIAGNOSIS — E1169 Type 2 diabetes mellitus with other specified complication: Secondary | ICD-10-CM

## 2023-08-11 DIAGNOSIS — E1165 Type 2 diabetes mellitus with hyperglycemia: Secondary | ICD-10-CM

## 2023-08-11 DIAGNOSIS — E1159 Type 2 diabetes mellitus with other circulatory complications: Secondary | ICD-10-CM

## 2023-08-11 NOTE — Progress Notes (Signed)
08/11/2023 Name: Charles [name] MRN: [id number] DOB: 12-30-1940  Chief Complaint  Patient presents with   Diabetes   Medication Management    Charles [name] is a 82 y.o. year old male who presented for a telephone visit.   They were referred to the pharmacist by their PCP for assistance in managing diabetes, hypertension, and complex medication management.    Subjective:  Care Team: Primary Care Provider: Shirline Frees, NP  Medication Access/Adherence  Current Pharmacy:  [facility] Delivery) - [place], [place] - [address] [facility] [address] [place] [address] [place] [address] Phone: [phone] Fax: [phone]  [facility] [address] [place]), [place] - [address] [place] [address] Phone: [phone] Fax: [phone]  [facility] Delivery - [place], [place] - [address] [place]  [address] Phone: [phone] Fax: [phone]   Patient reports affordability concerns with their medications: No  Patient reports access/transportation concerns to their pharmacy: No  Patient reports adherence concerns with their medications:  Yes  -never started Glipizide XL, states pharmacy needed clarification that they never received (patient did not notify Dr. Elvera Lennox or PCP of concern   Diabetes:  Current medications: Glipizide XL 5mg  1 tab with breakfast, Glipizide 5mg  1 tab at dinner, Metformin 1000mg  BID Medications tried in the past: None  Current glucose readings: 150s-160s, occasionally below 130, denies any lows  Using OneTouch Verio meter; testing 1 time daily - fasting   Patient denies hypoglycemic s/sx including dizziness, shakiness, sweating. Patient denies hyperglycemic symptoms including polyuria, polydipsia, polyphagia, nocturia, neuropathy, blurred vision.  Current meal patterns:  - Breakfast: can vary, can be toast or bowl of cereal, eggs  1-2x/week - Lunch usually go out with a friend, country food  - Supper at home, varies - Snacks frequently, halloween candy right now, chips - Drinks coffee with sweetener  Current physical activity: goes to the [employer]  Current medication access support: None  Hypertension:  Current medications: Metoprolol XL 100mg  BID Medications previously tried: Lisinopril, Spironolactone  Patient does not have a validated, automated, upper arm home BP cuff[facility] Current blood pressure readings readings: 120/60  Patient denies hypotensive s/sx including dizziness, lightheadedness.  Patient denies hypertensive symptoms including headache, chest pain, shortness of breath  Current meal patterns: see above  Current physical activity: see above   Objective:  Lab Results  Component Value Date   HGBA1C 8.1 (A) 05/19/2023    Lab Results  Component Value Date   CREATININE 0.96 10/11/2022   BUN 12 10/11/2022   NA 143 10/11/2022   K 4.6 10/11/2022   CL 103 10/11/2022   CO2 29 10/11/2022    Lab Results  Component Value Date   CHOL 115 09/13/2022   HDL 38.90 (L) 09/13/2022   LDLCALC 47 09/13/2022   LDLDIRECT 139.8 07/08/2014   TRIG 145.0 09/13/2022   CHOLHDL 3 09/13/2022    Medications Reviewed Today     Reviewed by Sherrill Raring, RPH (Pharmacist) on 08/11/23 at 1033  Med List Status: <None>   Medication Order Taking? Sig Documenting Provider Last Dose Status Informant  aspirin 81 MG chewable tablet [id number] Yes Chew 81 mg by mouth in the morning. [provider] Taking Active Self  atorvastatin (LIPITOR) 40 MG tablet [id number] Yes Take 1 tablet (40 mg total) by mouth daily at 6 PM.  Patient taking differently: Take 40  mg by mouth daily.   Orpah Cobb, MD Taking Active Self  Cholecalciferol (VITAMIN D-3) 125 MCG (5000 UT) TABS [id number] Yes Take 5,000 Units by mouth in the morning. [provider] Taking Active Self  Cyanocobalamin (VITAMIN  B-12) 5000 MCG SUBL [id number] Yes Place 5,000 mcg under the tongue every 3 (three) days. [provider] Taking Active Self           Med Note Barnie Mort Jul 07, 2023  9:31 AM) Once a week  cyclobenzaprine (FLEXERIL) 10 MG tablet [id number] No Take 1 tablet (10 mg total) by mouth at bedtime.  Patient not taking: Reported on 08/11/2023   Shirline Frees, NP Not Taking Active   ELIQUIS 5 MG TABS tablet [id number] Yes Take 5 mg by mouth 2 (two) times daily. [provider] Taking Active Self  EPINEPHrine 0.3 mg/0.3 mL IJ SOAJ injection [id number] No Inject 0.3 mg into the muscle as needed for anaphylaxis.  Patient not taking: Reported on 08/11/2023   [provider] Not Taking Active Self           Med Note Alvina Chou   Fri Jul 12, 2022  6:06 AM) Used over 10 years ago.  glipiZIDE (GLUCOTROL XL) 5 MG 24 hr tablet [id number]  Take 1-2 tablets (5-10 mg total) by mouth daily before breakfast.  Patient not taking: Reported on 07/07/2023   Carlus Pavlov, MD  Active            Med Note Barnie Mort Aug 11, 2023 10:05 AM) One daily in morning  glipiZIDE (GLUCOTROL) 5 MG tablet [id number] Yes Take 1 tablet (5 mg total) by mouth daily before supper. Carlus Pavlov, MD Taking Active   glucose blood [facility] VERIO) test strip [id number]  USE 1 STRIP TO CHECK GLUCOSE ONCE DAILY Carlus Pavlov, MD  Active   Insulin Pen Needle (PEN NEEDLES) 32G X 4 MM MISC [id number]  Use as instructed to administer weekly injection. Carlus Pavlov, MD  Active Self  Insulin Syringes, Disposable, U-100 0.5 ML MISC [id number]  Use 1x a day Carlus Pavlov, MD  Active Self  metFORMIN (GLUCOPHAGE) 1000 MG tablet [id number] Yes TAKE 1 TABLET BY MOUTH TWICE  DAILY WITH MEALS Carlus Pavlov, MD Taking Active   metoprolol succinate (TOPROL-XL) 100 MG 24 hr tablet [id number] Yes Take 100 mg by mouth in the morning and at bedtime. [provider] Taking Active Self            Med Note Tiburcio Pea, Jackie Plum Dec 27, 2020  8:38 AM)    Multiple Vitamins-Minerals (PRESERVISION AREDS 2 PO) [id number] Yes Take 1 tablet by mouth in the morning and at bedtime. [provider] Taking Active Self  nitroGLYCERIN (NITROSTAT) 0.4 MG SL tablet [id number]  Place 1 tablet (0.4 mg total) under the tongue every 5 (five) minutes as needed for chest pain. Orpah Cobb, MD  Active Self           Med Note Alvina Chou   Fri Jul 12, 2022  6:07 AM) Never used as of 07/12/22.  [facility] DELICA LANCETS FINE MISC [id number] Yes Use to check sugar daily Carlus Pavlov, MD Taking Active Self  tamsulosin (FLOMAX) 0.4 MG CAPS capsule [id number] Yes TAKE 1 CAPSULE BY MOUTH DAILY Nafziger, Kandee Keen, NP Taking Active               Assessment/Plan:   Diabetes: - Currently uncontrolled - Reviewed long  term cardiovascular and renal outcomes of uncontrolled blood sugar - Reviewed goal A1c, goal fasting, and goal 2 hour post prandial glucose - Reviewed dietary modifications including low carb diets and eating regular meals - Reviewed [facility] including[facility] - Recommend to increase Glipizide XL 5mg  to 2 tabs in the morning as mentioned by Dr. Elvera Lennox in previous visit for uncontrolled sugars - Recommend to check glucose once daily at varying times (fasting and post-prandial, keep a log --Reviewed patient finances, does not qualify for PAP to try and restart Ozempic -A1c check due, scheduled for Jan with endo     Hypertension: - Currently controlled - Reviewed long term cardiovascular and renal outcomes of uncontrolled blood pressure - Reviewed appropriate blood pressure monitoring technique and reviewed goal blood pressure. Recommended to check home blood pressure and heart rate once weekly - Recommend to continue current medication therapy     Follow Up Plan: 2 months  Sherrill Raring, PharmD Clinical  Pharmacist [phone]

## 2023-08-12 DIAGNOSIS — M25512 Pain in left shoulder: Secondary | ICD-10-CM | POA: Diagnosis not present

## 2023-08-12 DIAGNOSIS — M75122 Complete rotator cuff tear or rupture of left shoulder, not specified as traumatic: Secondary | ICD-10-CM | POA: Diagnosis not present

## 2023-08-12 DIAGNOSIS — R531 Weakness: Secondary | ICD-10-CM | POA: Diagnosis not present

## 2023-08-21 ENCOUNTER — Ambulatory Visit: Payer: Medicare Other | Admitting: Cardiovascular Disease

## 2023-08-26 DIAGNOSIS — M25512 Pain in left shoulder: Secondary | ICD-10-CM | POA: Diagnosis not present

## 2023-08-26 DIAGNOSIS — M75122 Complete rotator cuff tear or rupture of left shoulder, not specified as traumatic: Secondary | ICD-10-CM | POA: Diagnosis not present

## 2023-08-26 DIAGNOSIS — R531 Weakness: Secondary | ICD-10-CM | POA: Diagnosis not present

## 2023-09-15 ENCOUNTER — Ambulatory Visit: Payer: Medicare Other | Attending: Cardiovascular Disease | Admitting: Cardiovascular Disease

## 2023-09-15 ENCOUNTER — Encounter: Payer: Self-pay | Admitting: Cardiovascular Disease

## 2023-09-15 VITALS — BP 134/78 | HR 72 | Ht 70.5 in | Wt 250.0 lb

## 2023-09-15 DIAGNOSIS — I4891 Unspecified atrial fibrillation: Secondary | ICD-10-CM

## 2023-09-15 NOTE — Progress Notes (Signed)
 Cardiology Office Note:    Date:  09/15/2023   ID:  LACY TAGLIERI, DOB 1941/05/15, MRN [id number]  PCP:  Merna Huxley, NP   [facility] Providers Cardiologist:  None Electrophysiologist:  Eulas FORBES Furbish, MD     Referring MD: Merna Huxley, NP   Chief complaint: fatigue  History of Present Illness:    Charles [name] is a 83 y.o. male with a hx of coronary disease status post myocardial infarction 10/2015, history of adenomatous polyps type 2 diabetes, hypertension, for for arrhythmia management.  Atrial fibrillation is documented on an ECG from 10/20/2015. This occurred at the time of an admission for NSTEMI. I do not have records from that admission otherwise.  The patient recalls that he had an episode of atrial fibrillation about a year ago.  At that time, he was very fatigued and noticed that he was not able to walk from the parking lot into the [facility] where he exercises regularly.  He was started on amiodarone  by his cardiologist, Dr. Levern sometime around March 2023.  Per the patient's recollection, this is because he had recurrence of atrial fibrillation.    When he saw his endocrinologist in August, he expressed a concern that amiodarone  could affect his thyroid  levels.  His T4 and T3 returned within normal limits, but the TSH was significantly elevated at 9.93. He was advised to discuss amiodarone  discontinuation with cardiology.   He was seen by Dr. Levern, and according to the patient, was noted to be in atrial flutter at that time.  He was subsequently referred to EP.  He does not have significant palpitations, but he does feel short of breath and fatigue.  He is not able to exercise to the extent he was previously.  08/12/2022 He reports that he is doing very well today. He underwent PVI and CTI for fib and flutter on November 3 with good result. We did notice some swelling in the right submandibular area after the procedure. He has not had any bruising in the  area. He notes that he has had submandibular swelling that occurred after receiving contrast dye in the past. He had a CT a few days prior to the ablation.       Risk Assessment/Calculations:    CHA2DS2-VASc Score =     This indicates a  % annual risk of stroke. The patient's score is based upon:            Physical Exam:    VS:  BP 134/78 (BP Location: Left Arm, Patient Position: Sitting, Cuff Size: Large)   Pulse 72   Ht 5' 10.5 (1.791 m)   Wt 250 lb (113.4 kg)   SpO2 96%   BMI 35.36 kg/m     Wt Readings from Last 3 Encounters:  09/15/23 250 lb (113.4 kg)  05/19/23 248 lb 3.2 oz (112.6 kg)  05/09/23 245 lb (111.1 kg)     GEN:  Well nourished, well developed in no acute distress CARDIAC: RRR, no murmurs, rubs, gallops RESPIRATORY:  Normal work of breathing MUSCULOSKELETAL: trace edema    ASSESSMENT & PLAN:    Atrial fibrillation and typical atrial flutter:  s/p ablation, maintaining sinus rhythm.  Hypercoagulable state:  due to AF with CHADS2VASC of 5.  Continue eliquis  5mg  PO BID  Hypothyroid:  off of amiodarone             Medication Adjustments/Labs and Tests Ordered: Current medicines are reviewed at length with the patient today.  Concerns  regarding medicines are outlined above.  Orders Placed This Encounter  Procedures   EKG 12-Lead   No orders of the defined types were placed in this encounter.    Signed, Eulas FORBES Furbish, MD  09/15/2023 3:49 PM    [facility]

## 2023-09-15 NOTE — Patient Instructions (Addendum)
 Medication Instructions:  STOP Aspirin  *If you need a refill on your cardiac medications before your next appointment, please call your pharmacy*   Follow-Up: At [facility], you and your health needs are our priority.  As part of our continuing mission to provide you with exceptional heart care, we have created designated Provider Care Teams.  These Care Teams include your primary Cardiologist (physician) and Advanced Practice Providers (APPs -  Physician Assistants and Nurse Practitioners) who all work together to provide you with the care you need, when you need it.  We recommend signing up for the patient portal called MyChart.  Sign up information is provided on this After Visit Summary.  MyChart is used to connect with patients for Virtual Visits (Telemedicine).  Patients are able to view lab/test results, encounter notes, upcoming appointments, etc.  Non-urgent messages can be sent to your provider as well.   To learn more about what you can do with MyChart, go to [email or web address].    Your next appointment:   1 year(s)  Provider:   Eulas Furbish, MD

## 2023-09-18 DIAGNOSIS — H353133 Nonexudative age-related macular degeneration, bilateral, advanced atrophic without subfoveal involvement: Secondary | ICD-10-CM | POA: Diagnosis not present

## 2023-09-18 DIAGNOSIS — H43812 Vitreous degeneration, left eye: Secondary | ICD-10-CM | POA: Diagnosis not present

## 2023-09-18 DIAGNOSIS — E113293 Type 2 diabetes mellitus with mild nonproliferative diabetic retinopathy without macular edema, bilateral: Secondary | ICD-10-CM | POA: Diagnosis not present

## 2023-09-18 DIAGNOSIS — H35372 Puckering of macula, left eye: Secondary | ICD-10-CM | POA: Diagnosis not present

## 2023-09-18 DIAGNOSIS — H34811 Central retinal vein occlusion, right eye, with macular edema: Secondary | ICD-10-CM | POA: Diagnosis not present

## 2023-09-18 DIAGNOSIS — H35033 Hypertensive retinopathy, bilateral: Secondary | ICD-10-CM | POA: Diagnosis not present

## 2023-09-19 ENCOUNTER — Encounter: Payer: Self-pay | Admitting: Internal Medicine

## 2023-09-19 ENCOUNTER — Ambulatory Visit: Payer: Medicare Other | Admitting: Internal Medicine

## 2023-09-19 VITALS — BP 120/82 | HR 80 | Ht 70.5 in | Wt 250.8 lb

## 2023-09-19 DIAGNOSIS — N182 Chronic kidney disease, stage 2 (mild): Secondary | ICD-10-CM

## 2023-09-19 DIAGNOSIS — E1165 Type 2 diabetes mellitus with hyperglycemia: Secondary | ICD-10-CM | POA: Diagnosis not present

## 2023-09-19 DIAGNOSIS — E538 Deficiency of other specified B group vitamins: Secondary | ICD-10-CM | POA: Diagnosis not present

## 2023-09-19 DIAGNOSIS — R809 Proteinuria, unspecified: Secondary | ICD-10-CM | POA: Diagnosis not present

## 2023-09-19 DIAGNOSIS — Z7985 Long-term (current) use of injectable non-insulin antidiabetic drugs: Secondary | ICD-10-CM | POA: Diagnosis not present

## 2023-09-19 DIAGNOSIS — R7989 Other specified abnormal findings of blood chemistry: Secondary | ICD-10-CM | POA: Diagnosis not present

## 2023-09-19 DIAGNOSIS — E785 Hyperlipidemia, unspecified: Secondary | ICD-10-CM

## 2023-09-19 DIAGNOSIS — E1159 Type 2 diabetes mellitus with other circulatory complications: Secondary | ICD-10-CM

## 2023-09-19 DIAGNOSIS — E1122 Type 2 diabetes mellitus with diabetic chronic kidney disease: Secondary | ICD-10-CM | POA: Diagnosis not present

## 2023-09-19 DIAGNOSIS — Z7984 Long term (current) use of oral hypoglycemic drugs: Secondary | ICD-10-CM | POA: Diagnosis not present

## 2023-09-19 LAB — POCT GLYCOSYLATED HEMOGLOBIN (HGB A1C): Hemoglobin A1C: 8.3 % — AB (ref 4.0–5.6)

## 2023-09-19 MED ORDER — METFORMIN HCL 1000 MG PO TABS: 1000.0000 mg | ORAL_TABLET | Freq: Two times a day (BID) | ORAL | 3 refills | Status: DC

## 2023-09-19 MED ORDER — GLIPIZIDE 5 MG PO TABS: 5.0000 mg | ORAL_TABLET | Freq: Every day | ORAL | Status: DC

## 2023-09-19 MED ORDER — SEMAGLUTIDE(0.25 OR 0.5MG/DOS) 2 MG/3ML ~~LOC~~ SOPN: PEN_INJECTOR | SUBCUTANEOUS | Status: DC

## 2023-09-19 NOTE — Progress Notes (Addendum)
 Patient ID: Charles [name], male   DOB: March 04, 1941, 83 y.o.   MRN: [id number]  HPI: Charles [name] is a 83 y.o.-year-old male, returning for f/u for DM2 dx 2008, uncontrolled, with complications (CAD - s/p NSTEMI, PN, mild CKD). Last visit 4 months ago.  Interim history: No increased urination, blurry vision, nausea, chest pain. He has left shoulder pain (rotator cuff tears).    DM2: Reviewed HbA1c levels: Lab Results  Component Value Date   HGBA1C 8.1 (A) 05/19/2023   HGBA1C 7.8 (A) 01/14/2023   HGBA1C 7.7 (A) 09/13/2022  12/2013: 8.6%  Pt is on a regimen of: - Metformin  1000 mg 2x a day >> 2000 mg with dinner (but sugars worse) >> 1000 mg 2x a day - Glipizide  10 >> 5 mg 2x a day before meals >> 5 mg in a.m. and 5-10 mg before dinner >> 2.5 mg 2x a day before meals >> 1x a day before dinner >> after dinner >> before dinner - Glipizide  ER 5-10 mg before b'fast  - stopped and was not able to restart 2/2 price We tried to start Ozempic  0.25 mg weekly -08/2020 >> too expensive. In the past, I recommended  NPH 14 units at bedtime, but he did not start. We tried Rybelsus  3 >> 6 mg daily-added 12/2018 >> $$$ >> stopped 01/2019 He checked with his insurance and none of the SGLT2 inhibitors are covered for him.  Pt checks his sugars once a day: - am:  79, 105-139, 151 >> 110-181 >> 92-151, 158 >> 124-179, 190 - 2h after b'fast: 68x1 >> n/c >> 201, 206 >> n/c  - before lunch:  n/c >> 69, 79 >> n/c >> 90 x1 >> n/c - 2h after lunch: 123-176 >> n/c >> 317 >> n/c - before dinner: n/c >> 97 >> 90s >> n/c >> 80 >> n/c >> 124 - 2h after dinner: n/c >> 240 >> n/c - bedtime: 110-150 >> 160s >> n/c Lowest sugar was  50 >> ... 92; he has hypoglycemia awareness in the 80s. Highest sugar was 317  (forgot meds) >> 158  He sees nutrition.  Per their note from 08/11/2023: Current meal patterns:  - Breakfast: can vary, can be toast or bowl of cereal, eggs 1-2x/week - Lunch usually go out with a friend,  country food  - Supper at home, varies - Snacks frequently, halloween candy right now, chips - Drinks coffee with sweetener  -+ Mild CKD, last BUN/creatinine:  Lab Results  Component Value Date   BUN 12 10/11/2022   CREATININE 0.96 10/11/2022   Lab Results  Component Value Date   MICRALBCREAT 41.8 (H) 05/19/2023   MICRALBCREAT 48.1 (H) 07/16/2022   MICRALBCREAT 9.7 08/08/2016   MICRALBCREAT 10.6 07/21/2015   MICRALBCREAT 25.8 04/06/2014   MICRALBCREAT 12.8 06/29/2013   MICRALBCREAT 5.3 03/15/2013   MICRALBCREAT 2.8 02/13/2012   MICRALBCREAT 11.4 12/10/2010   MICRALBCREAT 197.7 (H) 11/17/2009  Of note, he has a history of nephrectomy. Off lisinopril  10.  -+ HL; last set of lipids: Lab Results  Component Value Date   CHOL 115 09/13/2022   HDL 38.90 (L) 09/13/2022   LDLCALC 47 09/13/2022   LDLDIRECT 139.8 07/08/2014   TRIG 145.0 09/13/2022   CHOLHDL 3 09/13/2022  On Lipitor 40.  - last eye exam was 01/22/2023: No DR; now seeing Dr. Oman and Dr. Raj- retinal vein occlusion, + cataracts, + macular hole.  He has a history of eye surgeries.  He gets intraocular injections  -  his numbness and tingling in feet improved after starting B12. Foot exam 09/13/2022.  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  B12 deficiency: -Diagnosed during investigation for numbness and tingling (peripheral neuropathy) -He was initially on B12 injections, then on 5000 mcg daily -He was on 5000 mcg every 3 days, changed by PCP 09/2019 -We changed to 5000 mcg weekly 09/2022  Reviewed his B12 levels: Lab Results  Component Value Date   VITAMINB12 934 (H) 05/19/2023   VITAMINB12 >1500 (H) 09/13/2022   VITAMINB12 832 10/09/2021   VITAMINB12 900 10/06/2020   VITAMINB12 748 12/22/2019   VITAMINB12 1,033 (H) 10/08/2019   VITAMINB12 1,172 (H) 08/19/2018    VITAMINB12 >1500 (H) 05/04/2018   VITAMINB12 1,249 (H) 02/18/2017   VITAMINB12 234 10/22/2016   He was admitted with NSTEMI 10/18/2015.  He had stents placed then.  He was going to the gym 5 out of 7 days, but stopped during the coronavirus pandemic.  He restarted to go to the gym afterwards.  ROS: + See HPI  I reviewed pt's medications, allergies, PMH, social hx, family hx, and changes were documented in the history of present illness. Otherwise, unchanged from my initial visit note.  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   Coronary artery disease    Dysrhythmia    PAF   Eczema    GERD (gastroesophageal reflux disease)    Hernia, ventral    History of blood transfusion    Hyperlipidemia    Hypertension    Myocardial infarction (HCC)    Neuropathy    Obesity    Personal history of adenomatous colonic polyps 07/20/2012   3 + adenomas 2009 07/20/2012 2 diminutive polyps     Pneumonia 1942; 1950s X 1   Type II diabetes mellitus (HCC)    type II   Walking pneumonia 2000's X 1   Past Surgical History:  Procedure Laterality Date   A-FLUTTER ABLATION N/A 07/12/2022   Procedure: A-FLUTTER ABLATION;  Surgeon: Nancey Eulas BRAVO, MD;  Location: [facility] INVASIVE CV LAB;  Service: Cardiovascular;  Laterality: N/A;   ATRIAL FIBRILLATION ABLATION N/A 07/12/2022   Procedure: ATRIAL FIBRILLATION ABLATION;  Surgeon: Nancey Eulas BRAVO, MD;  Location: [facility] INVASIVE CV LAB;  Service: Cardiovascular;  Laterality: N/A;   CARDIAC CATHETERIZATION N/A 10/19/2015   Procedure: Left Heart Cath and Coronary Angiography;  Surgeon: Rober Chroman, MD;  Location: [facility] INVASIVE CV LAB;  Service: Cardiovascular;  Laterality: N/A;   CARDIAC CATHETERIZATION N/A 10/19/2015   Procedure: Coronary Stent Intervention;  Surgeon: Rober Chroman, MD;  Location: [facility] INVASIVE CV LAB;  Service: Cardiovascular;  Laterality: N/A;   CATARACT EXTRACTION, BILATERAL Bilateral 01/2018   Dr. Rosan did surgery and he developed MD and is seeing specialist now for MD treatment   COLONOSCOPY     nothing showed up this time   COLONOSCOPY W/ BIOPSIES AND POLYPECTOMY  X 1   CORONARY ANGIOPLASTY     EXCISION MASS NECK Left 01/08/2021   Procedure: EXCISION MASS NECK;  Surgeon: Carlie Clark, MD;  Location: [facility] OR;  Service: ENT;  Laterality: Left;  FEMUR IM NAIL Right 07/26/2013   Procedure: INTRAMEDULLARY (IM) NAIL FEMORAL subtrochanteric;  Surgeon: Donnice JONETTA Car, MD;  Location: [facility] OR;  Service: Orthopedics;  Laterality: Right;   FRACTURE SURGERY     INGUINAL HERNIA REPAIR Left >3 times   INGUINAL HERNIA REPAIR Right 2012   NEPHRECTOMY Left 2000   TONSILLECTOMY  1940s   Social History   Socioeconomic History   Marital status: Widowed    Spouse name: Not on file   Number of children: 3   Years of education: Not on file   Highest education level: Master's degree (e.g., MA, MS, MEng, MEd, MSW, MBA)  Occupational History   Occupation: retired  Tobacco Use   Smoking status: Former    Current packs/day: 0.00    Types: Cigarettes, Pipe, Cigars    Start date: 09/09/1948    Quit date: 09/09/1968    Years since quitting: 55.0   Smokeless tobacco: Never   Tobacco comments:    smoking cigars- quit in 2006  , quit pipe 2000  Vaping Use   Vaping status: Never Used  Substance and Sexual Activity   Alcohol use: Not Currently    Alcohol/week: 2.0 standard drinks of alcohol    Types: 2 Glasses of wine per week    Comment: 2 glasses on Tuesday   Drug use: No   Sexual activity: Never  Other Topics Concern   Not on file  Social History Narrative   Regular exercise: goes to the [facility] and walks   Caffeine use: daily; coffee   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     widowed    Three children, One in [place], one in  [place] ,and one in [place]    HH - 2 : youngest daughter lives with him   Social Drivers of Corporate Investment Banker Strain: Low Risk  (05/08/2023)   Overall Financial Resource Strain (CARDIA)    Difficulty of Paying Living Expenses: Not hard at all  Food Insecurity: No Food Insecurity (05/08/2023)   Hunger Vital Sign    Worried About Running Out of Food in the Last Year: Never true    Ran Out of Food in the Last Year: Never true  Transportation Needs: No Transportation Needs (05/08/2023)   PRAPARE - Administrator, Civil Service (Medical): No    Lack of Transportation (Non-Medical): No  Physical Activity: Sufficiently Active (05/08/2023)   Exercise Vital Sign    Days of Exercise per Week: 5 days    Minutes of Exercise per Session: 30 min  Stress: No Stress Concern Present (05/08/2023)   [employer] of Occupational Health - Occupational Stress Questionnaire    Feeling of Stress : Not at all  Social Connections: Moderately Integrated (05/08/2023)   Social Connection and Isolation Panel [NHANES]    Frequency of Communication with Friends and Family: More than three times a week    Frequency of Social Gatherings with Friends and Family: More than three times a week    Attends Religious Services: More than 4 times per year    Active Member of [employer] or Organizations: Yes    Attends Banker Meetings: Never    Marital Status: Widowed  Intimate Partner Violence: Not At Risk (01/01/2023)   Humiliation, Afraid, Rape, and Kick questionnaire    Fear of Current or Ex-Partner: No    Emotionally Abused: No    Physically Abused: No    Sexually Abused: No   Current Outpatient Medications on File  Prior to Visit  Medication Sig Dispense Refill   atorvastatin  (LIPITOR) 40 MG tablet Take 1 tablet (40 mg total) by mouth daily at 6 PM. (Patient taking differently: Take 40 mg by mouth daily.) 30 tablet 3   Cholecalciferol  (VITAMIN D -3) 125 MCG (5000 UT) TABS Take 5,000 Units  by mouth in the morning.     Cyanocobalamin  (VITAMIN B-12) 5000 MCG SUBL Place 5,000 mcg under the tongue every 3 (three) days.     cyclobenzaprine  (FLEXERIL ) 10 MG tablet Take 1 tablet (10 mg total) by mouth at bedtime. (Patient not taking: Reported on 09/15/2023) 15 tablet 0   ELIQUIS  5 MG TABS tablet Take 5 mg by mouth 2 (two) times daily.     EPINEPHrine  0.3 mg/0.3 mL IJ SOAJ injection Inject 0.3 mg into the muscle as needed for anaphylaxis.     glipiZIDE  (GLUCOTROL  XL) 5 MG 24 hr tablet Take 1-2 tablets (5-10 mg total) by mouth daily before breakfast. 180 tablet 3   glipiZIDE  (GLUCOTROL ) 5 MG tablet Take 1 tablet (5 mg total) by mouth daily before supper.     glucose blood (ONETOUCH VERIO) test strip USE 1 STRIP TO CHECK GLUCOSE ONCE DAILY 100 each 3   Insulin  Pen Needle (PEN NEEDLES) 32G X 4 MM MISC Use as instructed to administer weekly injection. 30 each 1   Insulin  Syringes, Disposable, U-100 0.5 ML MISC Use 1x a day 100 each 3   metFORMIN  (GLUCOPHAGE ) 1000 MG tablet TAKE 1 TABLET BY MOUTH TWICE  DAILY WITH MEALS 200 tablet 2   metoprolol  succinate (TOPROL -XL) 100 MG 24 hr tablet Take 100 mg by mouth in the morning and at bedtime.     Multiple Vitamins-Minerals (PRESERVISION AREDS 2 PO) Take 1 tablet by mouth in the morning and at bedtime.     nitroGLYCERIN  (NITROSTAT ) 0.4 MG SL tablet Place 1 tablet (0.4 mg total) under the tongue every 5 (five) minutes as needed for chest pain. 25 tablet 1   ONETOUCH DELICA LANCETS FINE MISC Use to check sugar daily 100 each 5   tamsulosin  (FLOMAX ) 0.4 MG CAPS capsule TAKE 1 CAPSULE BY MOUTH DAILY 100 capsule 2   No current facility-administered medications on file prior to visit.   Allergies  Allergen Reactions   Bee Venom Anaphylaxis   Family History  Problem Relation Age of Onset   Drug abuse Other    Cancer Other    Heart disease Other    Lung disease Other    Diabetes Mother    Diabetes Sister    Colon cancer Neg Hx    Stomach cancer Neg  Hx    PE: BP 120/82   Pulse 80   Ht 5' 10.5 (1.791 m)   Wt 250 lb 12.8 oz (113.8 kg)   SpO2 93%   BMI 35.48 kg/m  Wt Readings from Last 3 Encounters:  09/19/23 250 lb 12.8 oz (113.8 kg)  09/15/23 250 lb (113.4 kg)  05/19/23 248 lb 3.2 oz (112.6 kg)   Constitutional: overweight, in NAD Eyes: EOMI, no exophthalmos ENT: no thyromegaly, no cervical lymphadenopathy Cardiovascular: RRR, No MRG Respiratory: CTA B Musculoskeletal: no deformities Skin:  no rashes Neurological: no tremor with outstretched hands  ASSESSMENT: 1. DM2, not insulin -dependent, uncontrolled, with complications - CAD - s/p NSTEMI (Dr. Levern) - peripheral neuropathy - CKD   He does not have a family history of medullary thyroid  cancer or personal history of pancreatitis.   2. B12 def  3.  Peripheral neuropathy  4. HL  5.  History of elevated TSH -In the setting of amiodarone  treatment  PLAN:  1. Patient with longstanding, uncontrolled, type 2 diabetes, previously with significant improvement in control after adding Ozempic  but off the medication due to price.  He continues on metformin  and sulfonylurea.  At last visit, he was still not able to start Ozempic  and he was taking glipizide  IR at night, before after dinner.  I advised him to only take it before dinner.  Since HbA1c was higher, at 8.1%, I recommended adding glipizide  ER dose in the morning, before breakfast.  I advised him to start at 5 mg daily and to increase to 10 mg daily if needed.  I also recommended to check some sugars later in the day, as he was only checking in the morning. -At this visit his sugars are elevated, above target.  He is only checking in the morning, with only 1 blood sugar check later in the day.  I did advise him to rotate the sugars more.  He feels that the glipizide  ER is not helping too much and I agree with him.  He would like to retry Ozempic , to decrease his high deductible.  Will start at a low dose and increase as  tolerated.  In the meantime, we will go ahead and stop the glipizide  ER to reduce the risk of hypoglycemia.  Will continue the rest of the regimen but I again emphasized the need to take the glipizide  IR before and not after dinner.  He usually has dinner late. - I advised him to: Patient Instructions  Please continue: - Metformin  1000 mg 2x a day with meals - Glipizide  5 mg before dinner  Stop: - Glipizide  ER  Please start: - Ozempic  0.25 mg weekly in a.m. (for example on Sunday morning) x 2-4 weeks, then increase to 0.5 mg weekly in a.m. if no nausea or hypoglycemia.  Please check some sugars later in the day, also.   Please return in 4 months with your sugar log.   - we checked his HbA1c: 8.3% (higher) - advised to check sugars at different times of the day - 1x a day, rotating check times - advised for yearly eye exams >> he is UTD - at last visit, an ACR was still elevated.  He is not taking lisinopril .  At today's visit, we discussed about repeating the ACR and may need to restart lisinopril  but I advised him to discuss this with PCP at next visit. - he will need a foot exam at next visit - return to clinic in 4 months  2.  And 3. Peripheral neuropathy and B12 deficiency -He has peripheral neuropathy probably related to both diabetes and B12 deficiency -We started B12 supplementation, which we were able to then reduce to 5000 mcg weekly -At last visit the B12 level was only slightly above target so I advised him to continue the same regimen  4. HL -Reviewed the latest lipid panel from 09/2022: Fractions at goal with the exception of a slightly low HDL: Lab Results  Component Value Date   CHOL 115 09/13/2022   HDL 38.90 (L) 09/13/2022   LDLCALC 47 09/13/2022   LDLDIRECT 139.8 07/08/2014   TRIG 145.0 09/13/2022   CHOLHDL 3 09/13/2022  -He continues on Lipitor 40 mg daily without side effects  5.  High TSH -He had an elevated TSH, 9.93, in the past while on amiodarone ,  but he stopped amiodarone  since -Latest TSH was normal: Lab Results  Component Value Date   TSH 4.59 09/13/2022  -No signs or symptoms of hypothyroidism -Will continue to follow him expectantly  Component     Latest Ref Rng 09/19/2023  Hemoglobin A1C     4.0 - 5.6 % 8.3 !   Microalb, Ur     mg/dL 42.6   MICROALB/CREAT RATIO     <30 mg/g creat 707 (H)   Creatinine, Urine     20 - 320 mg/dL 81   ACR is very high, much increased from previous.  I would suggest to come back for a repeat in 1 week but if still elevated, to discuss with PCP about a possible referral to nephrology.  Component     Latest Ref Rng 10/02/2023  Microalb, Ur     mg/dL 873.0   MICROALB/CREAT RATIO     <30 mg/g creat 783 (H)   Creatinine, Urine     20 - 320 mg/dL 837   ACR is even higher, at this point, I would suggest a referral to nephrology.  He has an appointment with PCP next month and I will advise him to discuss with PCP.  Lela Fendt, MD PhD [facility] Endocrinology

## 2023-09-19 NOTE — Patient Instructions (Addendum)
 Please continue: - Metformin  1000 mg 2x a day with meals - Glipizide  5 mg before dinner  Stop: - Glipizide  ER  Please start: - Ozempic  0.25 mg weekly in a.m. (for example on Sunday morning) x 2-4 weeks, then increase to 0.5 mg weekly in a.m. if no nausea or hypoglycemia.  Please check some sugars later in the day, also.   Please return in 4 months with your sugar log.

## 2023-09-20 LAB — MICROALBUMIN / CREATININE URINE RATIO
Creatinine, Urine: 81 mg/dL (ref 20–320)
Microalb Creat Ratio: 707 mg/g{creat} — ABNORMAL HIGH (ref <30)
Microalb, Ur: 57.3 mg/dL

## 2023-09-22 ENCOUNTER — Encounter: Payer: Self-pay | Admitting: Internal Medicine

## 2023-09-22 NOTE — Addendum Note (Signed)
 Addended by: Carlus Pavlov on: 09/22/2023 12:26 PM   Modules accepted: Orders

## 2023-09-25 ENCOUNTER — Other Ambulatory Visit: Payer: Medicare Other

## 2023-10-02 ENCOUNTER — Other Ambulatory Visit: Payer: Medicare Other

## 2023-10-02 DIAGNOSIS — R809 Proteinuria, unspecified: Secondary | ICD-10-CM | POA: Diagnosis not present

## 2023-10-03 ENCOUNTER — Encounter: Payer: Self-pay | Admitting: Internal Medicine

## 2023-10-03 LAB — MICROALBUMIN / CREATININE URINE RATIO
Creatinine, Urine: 162 mg/dL (ref 20–320)
Microalb Creat Ratio: 783 mg/g{creat} — ABNORMAL HIGH (ref <30)
Microalb, Ur: 126.9 mg/dL

## 2023-10-20 ENCOUNTER — Other Ambulatory Visit (INDEPENDENT_AMBULATORY_CARE_PROVIDER_SITE_OTHER): Payer: Medicare Other

## 2023-10-20 DIAGNOSIS — E1159 Type 2 diabetes mellitus with other circulatory complications: Secondary | ICD-10-CM

## 2023-10-20 DIAGNOSIS — E1165 Type 2 diabetes mellitus with hyperglycemia: Secondary | ICD-10-CM

## 2023-10-20 NOTE — Progress Notes (Signed)
10/20/2023 Name: Charles [name] MRN: [id number] DOB: 1941/07/31  Chief Complaint  Patient presents with   Diabetes   Medication Management   Hypertension    Charles [name] is a 83 y.o. year old male who presented for a telephone visit.   They were referred to the pharmacist by their PCP for assistance in managing diabetes, hypertension, and complex medication management.    Subjective:  Care Team: Primary Care Provider: Shirline Frees, NP, Next visit 10/22/23  Medication Access/Adherence  Current Pharmacy:  [facility]) - [place], [place] - [address] [facility] [address] [place] [address] [place] [address] Phone: [phone] Fax: [phone]  [facility] [address] [place]), [place] - [address] [place] [address] Phone: [phone] Fax: [phone]  [facility] - [place], [place] - [address] [place] [address] Phone: [phone] Fax: [phone]   Patient reports affordability concerns with their medications: No  Patient reports access/transportation concerns to their pharmacy: No  Patient reports adherence concerns with their medications:  No  Diabetes:  Current medications: Glipizide 5mg  1 tab at dinner, Ozempic 0.5mg  (not started yet), Metformin 1000mg  BID Medications tried in the past: None  Current glucose readings: 140s in the morning, denies anything below for 100 for a long time Using OneTouch Verio meter; testing 1 time daily - fasting   Patient denies hypoglycemic s/sx including dizziness, shakiness, sweating. Patient denies hyperglycemic symptoms including polyuria, polydipsia, polyphagia, nocturia, neuropathy, blurred vision.  Current meal patterns:  - Breakfast: can vary, can be toast or bowl of cereal, eggs 1-2x/week - Lunch usually go out with a friend, country food  - Supper at home, varies - Snacks frequently,  halloween candy right now, chips - Drinks coffee with sweetener  Current physical activity: goes to the [employer]  Current medication access support: None  Hypertension/Afib:  Current medications: Metoprolol XL 100mg  BID, Eliquis 5mg  BID Medications previously tried: Lisinopril, Spironolactone  Patient does not have a validated, automated, upper arm home BP cuff[facility] Current blood pressure readings readings: 120/60  Patient denies hypotensive s/sx including dizziness, lightheadedness.  Patient denies hypertensive symptoms including headache, chest pain, shortness of breath  Current meal patterns: see above  Current physical activity: see above   Objective:  Lab Results  Component Value Date   HGBA1C 8.3 (A) 09/19/2023    Lab Results  Component Value Date   CREATININE 0.96 10/11/2022   BUN 12 10/11/2022   NA 143 10/11/2022   K 4.6 10/11/2022   CL 103 10/11/2022   CO2 29 10/11/2022    Lab Results  Component Value Date   CHOL 115 09/13/2022   HDL 38.90 (L) 09/13/2022   LDLCALC 47 09/13/2022   LDLDIRECT 139.8 07/08/2014   TRIG 145.0 09/13/2022   CHOLHDL 3 09/13/2022    Medications Reviewed Today     Reviewed by Sherrill Raring, RPH (Pharmacist) on 10/20/23 at 0925  Med List Status: <None>   Medication Order Taking? Sig Documenting Provider Last Dose Status Informant  atorvastatin (LIPITOR) 40 MG tablet [id number] Yes Take 1 tablet (40 mg total) by mouth daily at 6 PM.  Patient taking differently: Take 40 mg by mouth daily.   Orpah Cobb, MD Taking Active Self  Cholecalciferol (VITAMIN D-3) 125 MCG (5000 UT) TABS [id number] Yes Take 5,000 Units by mouth in the morning. [provider]  Taking Active Self  Cyanocobalamin (VITAMIN B-12) 5000 MCG SUBL [id number] Yes Place 5,000 mcg under the tongue every 3 (three) days. [provider] Taking Active Self           Med Note Barnie Mort Jul 07, 2023  9:31 AM) Once a week   ELIQUIS 5 MG TABS tablet [id number] Yes Take 5 mg by mouth 2 (two) times daily. [provider] Taking Active Self  EPINEPHrine 0.3 mg/0.3 mL IJ SOAJ injection [id number]  Inject 0.3 mg into the muscle as needed for anaphylaxis. [provider]  Active Self           Med Note Alvina Chou   Fri Jul 12, 2022  6:06 AM) Used over 10 years ago.  glipiZIDE (GLUCOTROL) 5 MG tablet [id number] Yes Take 1 tablet (5 mg total) by mouth daily before supper. Carlus Pavlov, MD Taking Active   glucose blood [facility] VERIO) test strip [id number]  USE 1 STRIP TO CHECK GLUCOSE ONCE DAILY Carlus Pavlov, MD  Active   Insulin Syringes, Disposable, U-100 0.5 ML MISC [id number]  Use 1x a day Carlus Pavlov, MD  Active Self  metFORMIN (GLUCOPHAGE) 1000 MG tablet [id number] Yes Take 1 tablet (1,000 mg total) by mouth 2 (two) times daily with a meal. Carlus Pavlov, MD Taking Active   metoprolol succinate (TOPROL-XL) 100 MG 24 hr tablet [id number] Yes Take 100 mg by mouth in the morning and at bedtime. [provider] Taking Active Self           Med Note Tiburcio Pea, Jackie Plum Dec 27, 2020  8:38 AM)    Multiple Vitamins-Minerals (PRESERVISION AREDS 2 PO) [id number] Yes Take 1 tablet by mouth in the morning and at bedtime. [provider] Taking Active Self  nitroGLYCERIN (NITROSTAT) 0.4 MG SL tablet [id number]  Place 1 tablet (0.4 mg total) under the tongue every 5 (five) minutes as needed for chest pain. Orpah Cobb, MD  Active Self           Med Note Alvina Chou   Fri Jul 12, 2022  6:07 AM) Never used as of 07/12/22.  ONETOUCH DELICA LANCETS FINE MISC [id number]  Use to check sugar daily Carlus Pavlov, MD  Active Self  Semaglutide,0.25 or 0.5MG /DOS, 2 MG/3ML Namon Cirri [id number]  Inject 0.5 mg weekly under skin Carlus Pavlov, MD  Active   tamsulosin (FLOMAX) 0.4 MG CAPS capsule [id number] Yes TAKE 1 CAPSULE BY MOUTH DAILY Nafziger, Kandee Keen, NP Taking Active                Assessment/Plan:   Diabetes: - Currently uncontrolled - Reviewed long term cardiovascular and renal outcomes of uncontrolled blood sugar - Reviewed goal A1c, goal fasting, and goal 2 hour post prandial glucose - Reviewed dietary modifications including low carb diets and eating regular meals - Reviewed [facility] modifications including[facility] -Recent lab result at endo shows elevated uACR, recommend starting low dose ACE/ARB Microalb Creat Ratio 783 High    -Recommend to restart Ozempic as stated by Endo, providing sample - Recommend to check glucose once daily at varying times (fasting and post-prandial, keep a log      Hypertension/Afib: - Currently controlled - Reviewed long term cardiovascular and renal outcomes of uncontrolled blood pressure - Reviewed appropriate blood pressure monitoring technique and reviewed goal blood pressure. Recommended to check home blood pressure and heart rate 2-3x/week -Would need to closely monitor BP if ACE/ARB  started. May be able to dose decrease metoprolol as patient is post-ablation to allow for dose maximization of ACE/ARB. Will consult with PCP, patient also currently managed by cardio     Follow Up Plan: 1 month (sees PCP 10/22/23)  Sherrill Raring, PharmD Clinical Pharmacist [phone]

## 2023-10-22 ENCOUNTER — Encounter: Payer: Self-pay | Admitting: Adult Health

## 2023-10-22 ENCOUNTER — Ambulatory Visit (INDEPENDENT_AMBULATORY_CARE_PROVIDER_SITE_OTHER): Payer: Medicare Other | Admitting: Adult Health

## 2023-10-22 VITALS — BP 128/82 | HR 73 | Temp 97.6°F | Ht 70.5 in | Wt 251.0 lb

## 2023-10-22 DIAGNOSIS — Z0000 Encounter for general adult medical examination without abnormal findings: Secondary | ICD-10-CM

## 2023-10-22 DIAGNOSIS — I4891 Unspecified atrial fibrillation: Secondary | ICD-10-CM

## 2023-10-22 DIAGNOSIS — I252 Old myocardial infarction: Secondary | ICD-10-CM

## 2023-10-22 DIAGNOSIS — Z7984 Long term (current) use of oral hypoglycemic drugs: Secondary | ICD-10-CM | POA: Diagnosis not present

## 2023-10-22 DIAGNOSIS — E785 Hyperlipidemia, unspecified: Secondary | ICD-10-CM | POA: Diagnosis not present

## 2023-10-22 DIAGNOSIS — E119 Type 2 diabetes mellitus without complications: Secondary | ICD-10-CM

## 2023-10-22 DIAGNOSIS — N400 Enlarged prostate without lower urinary tract symptoms: Secondary | ICD-10-CM

## 2023-10-22 DIAGNOSIS — I10 Essential (primary) hypertension: Secondary | ICD-10-CM

## 2023-10-22 LAB — CBC
HCT: 43.1 % (ref 39.0–52.0)
Hemoglobin: 14.5 g/dL (ref 13.0–17.0)
MCHC: 33.6 g/dL (ref 30.0–36.0)
MCV: 95.9 fL (ref 78.0–100.0)
Platelets: 214 10*3/uL (ref 150.0–400.0)
RBC: 4.5 Mil/uL (ref 4.22–5.81)
RDW: 13.2 % (ref 11.5–15.5)
WBC: 5.4 10*3/uL (ref 4.0–10.5)

## 2023-10-22 LAB — COMPREHENSIVE METABOLIC PANEL
ALT: 22 U/L (ref 0–53)
AST: 22 U/L (ref 0–37)
Albumin: 4.3 g/dL (ref 3.5–5.2)
Alkaline Phosphatase: 70 U/L (ref 39–117)
BUN: 15 mg/dL (ref 6–23)
CO2: 32 meq/L (ref 19–32)
Calcium: 9.2 mg/dL (ref 8.4–10.5)
Chloride: 100 meq/L (ref 96–112)
Creatinine, Ser: 1 mg/dL (ref 0.40–1.50)
GFR: 70.06 mL/min (ref >60.00)
Glucose, Bld: 181 mg/dL — ABNORMAL HIGH (ref 70–99)
Potassium: 4.8 meq/L (ref 3.5–5.1)
Sodium: 140 meq/L (ref 135–145)
Total Bilirubin: 0.8 mg/dL (ref 0.2–1.2)
Total Protein: 6.7 g/dL (ref 6.0–8.3)

## 2023-10-22 LAB — LIPID PANEL
Cholesterol: 139 mg/dL (ref 0–200)
HDL: 44.7 mg/dL (ref >39.00)
LDL Cholesterol: 57 mg/dL (ref 0–99)
NonHDL: 94.04
Total CHOL/HDL Ratio: 3
Triglycerides: 183 mg/dL — ABNORMAL HIGH (ref 0.0–149.0)
VLDL: 36.6 mg/dL (ref 0.0–40.0)

## 2023-10-22 LAB — TSH: TSH: 2.78 u[IU]/mL (ref 0.35–5.50)

## 2023-10-22 LAB — PSA: PSA: 1.58 ng/mL (ref 0.10–4.00)

## 2023-10-22 NOTE — Patient Instructions (Signed)
It was great seeing you today   We will follow up with you regarding your lab work   Please let me know if you need anything

## 2023-10-22 NOTE — Progress Notes (Signed)
Subjective:    Patient ID: Charles [name], male    DOB: 09/14/1940, 83 y.o.   MRN: [id number]  HPI Patient presents for yearly preventative medicine examination. He is a pleasant 83 year old male who  has a past medical history of Anemia, Arthritis, AVM (arteriovenous malformation) of colon, Cancer of kidney (HCC) (2000), Coronary artery disease, Dysrhythmia, Eczema, GERD (gastroesophageal reflux disease), Hernia, ventral, History of blood transfusion, Hyperlipidemia, Hypertension, Myocardial infarction (HCC), Neuropathy, Obesity, Personal history of adenomatous colonic polyps (07/20/2012), Pneumonia (1942; 1950s X 1), Type II diabetes mellitus (HCC), and Walking pneumonia (2000's X 1).  A-Fib -rate controlled with metoprolol 100 mg BID.  On chronic anticoagulation with Eliquis 5 mg twice daily. He denies chest pain, palpitations, or DOE.   DM -managed by endocrinology.  Currently prescribed glipizide 5 mg in the pm  and  metformin 1000 mg twice daily. He is going to start on Ozempic.  It was noted that his recent urine microalbumin/crea ration was 783.  Lab Results  Component Value Date   HGBA1C 8.3 (A) 09/19/2023   HGBA1C 8.1 (A) 05/19/2023   HGBA1C 7.8 (A) 01/14/2023   Hyperlipidemia/CAD -prescribed Lipitor 40 mg daily.  He denies myalgia or fatigue.  He is seen by cardiology on a routine basis.  He has not experienced chest pain, shortness of breath, or dyspnea on exertion Lab Results  Component Value Date   CHOL 115 09/13/2022   HDL 38.90 (L) 09/13/2022   LDLCALC 47 09/13/2022   LDLDIRECT 139.8 07/08/2014   TRIG 145.0 09/13/2022   CHOLHDL 3 09/13/2022   Hypertension- managed with Toprol 100 mg BID. BP Readings from Last 3 Encounters:  10/22/23 128/82  09/19/23 120/82  09/15/23 134/78    History of NSTEMI-in 2017.  Had stent placed. He denies chest pain or shortness of breath. His current cardiologist is Dr. Sharyn Lull but he Dr. Sharyn Lull is no longer in his network.   BPH-uses  Flomax 0.4 mg.  Symptoms are well controlled  Vitamin D Deficiency- Takes Vitamin D 5000 units daily.    All immunizations and health maintenance protocols were reviewed with the patient and needed orders were placed.  Appropriate screening laboratory values were ordered for the patient including screening of hyperlipidemia, renal function and hepatic function. If indicated by BPH, a PSA was ordered.  Medication reconciliation,  past medical history, social history, problem list and allergies were reviewed in detail with the patient  Goals were established with regard to weight loss, exercise, and  diet in compliance with medications Wt Readings from Last 3 Encounters:  10/22/23 251 lb (113.9 kg)  09/19/23 250 lb 12.8 oz (113.8 kg)  09/15/23 250 lb (113.4 kg)    Review of Systems  Constitutional: Negative.   HENT: Negative.    Eyes: Negative.   Respiratory: Negative.    Cardiovascular: Negative.   Gastrointestinal: Negative.   Endocrine: Negative.   Genitourinary: Negative.   Musculoskeletal:  Positive for arthralgias.  Skin: Negative.   Allergic/Immunologic: Negative.   Neurological: Negative.   Hematological: Negative.   Psychiatric/Behavioral: Negative.    All other systems reviewed and are negative.  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   Coronary artery disease    Dysrhythmia  PAF   Eczema    GERD (gastroesophageal reflux disease)    Hernia, ventral    History of blood transfusion    Hyperlipidemia    Hypertension    Myocardial infarction [facility])    Neuropathy    Obesity    Personal history of adenomatous colonic polyps 07/20/2012   3 + adenomas 2009 07/20/2012 2 diminutive polyps     Pneumonia 1942; 1950s X 1   Type II diabetes mellitus (HCC)    type II   Walking  pneumonia 2000's X 1    Social History   Socioeconomic History   Marital status: Widowed    Spouse name: Not on file   Number of children: 3   Years of education: Not on file   Highest education level: Master's degree (e.g., MA, MS, MEng, MEd, MSW, MBA)  Occupational History   Occupation: retired  Tobacco Use   Smoking status: Former    Current packs/day: 0.00    Types: Cigarettes, Pipe, Cigars    Start date: 09/09/1948    Quit date: 09/09/1968    Years since quitting: 55.1   Smokeless tobacco: Never   Tobacco comments:    smoking cigars- quit in 2006  , quit pipe 2000  Vaping Use   Vaping status: Never Used  Substance and Sexual Activity   Alcohol use: Not Currently    Alcohol/week: 2.0 standard drinks of alcohol    Types: 2 Glasses of wine per week    Comment: 2 glasses on Tuesday   Drug use: No   Sexual activity: Never  Other Topics Concern   Not on file  Social History Narrative   Regular exercise: goes to the [facility] and walks   Caffeine use: daily; coffee   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     widowed    Three children, One in [place], one in [place] one in [place]   [facility] - 2 : youngest daughter lives with him   Social Drivers of Corporate investment banker Strain: Low Risk  (10/18/2023)   Overall Financial Resource Strain (CARDIA)    Difficulty of Paying Living Expenses: Not hard at all  Food Insecurity: No Food Insecurity (10/18/2023)   Hunger Vital Sign    Worried About Running Out of Food in the Last Year: Never true    Ran Out of Food in the Last Year: Never true  Transportation Needs: No Transportation Needs (10/18/2023)   PRAPARE - Administrator, Civil Service (Medical): No    Lack of Transportation (Non-Medical): No  Physical Activity: Sufficiently Active (10/18/2023)   Exercise Vital Sign    Days of Exercise per Week: 5 days    Minutes of Exercise per Session: 40 min  Stress: No Stress Concern Present (10/18/2023)   [employer] of Occupational Health - Occupational Stress Questionnaire    Feeling of Stress : Not at all  Social Connections: Moderately Isolated (10/18/2023)   Social Connection and Isolation Panel [NHANES]    Frequency of Communication with Friends and Family: More than three times a week    Frequency of Social Gatherings with Friends and Family: More than three times a week    Attends Religious Services: More than 4 times per year    Active Member of [employer] or Organizations: No    Attends Banker Meetings: Never    Marital Status: Widowed  Intimate Partner Violence: Not At Risk (01/01/2023)   Humiliation, Afraid, Rape, and Kick  questionnaire    Fear of Current or Ex-Partner: No    Emotionally Abused: No    Physically Abused: No    Sexually Abused: No    Past Surgical History:  Procedure Laterality Date   A-FLUTTER ABLATION N/A 07/12/2022   Procedure: A-FLUTTER ABLATION;  Surgeon: Mealor, Roberts Gaudy, MD;  Location: [facility] INVASIVE CV LAB;  Service: Cardiovascular;  Laterality: N/A;   ATRIAL FIBRILLATION ABLATION N/A 07/12/2022   Procedure: ATRIAL FIBRILLATION ABLATION;  Surgeon: Maurice Small, MD;  Location: [facility]VAS[facility];  Service: Cardiovascular;  Laterality: N/A;   CARDIAC CATHETERIZATION N/A 10/19/2015   Procedure: Left Heart Cath and Coronary Angiography;  Surgeon: Rinaldo Cloud, MD;  Location: [facility] INVASIVE CV LAB;  Service: Cardiovascular;  Laterality: N/A;   CARDIAC CATHETERIZATION N/A 10/19/2015   Procedure: Coronary Stent Intervention;  Surgeon: Rinaldo Cloud, MD;  Location: [facility] INVASIVE CV LAB;  Service: Cardiovascular;  Laterality: N/A;   CATARACT EXTRACTION, BILATERAL Bilateral 01/2018   Dr. Dagoberto Ligas did surgery and he developed MD and is seeing specialist now for MD treatment   COLONOSCOPY     "nothing showed up this time"   COLONOSCOPY W/ BIOPSIES AND POLYPECTOMY  X 1   CORONARY ANGIOPLASTY     EXCISION MASS NECK Left 01/08/2021   Procedure: EXCISION MASS  NECK;  Surgeon: Christia Reading, MD;  Location: [facility] OR;  Service: ENT;  Laterality: Left;   FEMUR IM NAIL Right 07/26/2013   Procedure: INTRAMEDULLARY (IM) NAIL FEMORAL subtrochanteric;  Surgeon: Shelda Pal, MD;  Location: [facility] OR;  Service: Orthopedics;  Laterality: Right;   FRACTURE SURGERY     INGUINAL HERNIA REPAIR Left >3 times   INGUINAL HERNIA REPAIR Right 2012   NEPHRECTOMY Left 2000   TONSILLECTOMY  1940s    Family History  Problem Relation Age of Onset   Drug abuse Other    Cancer Other    Heart disease Other    Lung disease Other    Diabetes Mother    Diabetes Sister    Colon cancer Neg Hx    Stomach cancer Neg Hx     Allergies  Allergen Reactions   Bee Venom Anaphylaxis    Current Outpatient Medications on File Prior to Visit  Medication Sig Dispense Refill   atorvastatin (LIPITOR) 40 MG tablet Take 1 tablet (40 mg total) by mouth daily at 6 PM. (Patient taking differently: Take 40 mg by mouth daily.) 30 tablet 3   Cholecalciferol (VITAMIN D-3) 125 MCG (5000 UT) TABS Take 5,000 Units by mouth in the morning.     Cyanocobalamin (VITAMIN B-12) 5000 MCG SUBL Place 5,000 mcg under the tongue every 3 (three) days.     ELIQUIS 5 MG TABS tablet Take 5 mg by mouth 2 (two) times daily.     EPINEPHrine 0.3 mg/0.3 mL IJ SOAJ injection Inject 0.3 mg into the muscle as needed for anaphylaxis.     glipiZIDE (GLUCOTROL) 5 MG tablet Take 1 tablet (5 mg total) by mouth daily before supper.     glucose blood (ONETOUCH VERIO) test strip USE 1 STRIP TO CHECK GLUCOSE ONCE DAILY 100 each 3   metFORMIN (GLUCOPHAGE) 1000 MG tablet Take 1 tablet (1,000 mg total) by mouth 2 (two) times daily with a meal. 200 tablet 3   metoprolol succinate (TOPROL-XL) 100 MG 24 hr tablet Take 100 mg by mouth in the morning and at bedtime.     Multiple Vitamins-Minerals (PRESERVISION AREDS 2 PO) Take 1 tablet by mouth in  the morning and at bedtime.     nitroGLYCERIN (NITROSTAT) 0.4 MG SL tablet Place 1 tablet  (0.4 mg total) under the tongue every 5 (five) minutes as needed for chest pain. 25 tablet 1   ONETOUCH DELICA LANCETS FINE MISC Use to check sugar daily 100 each 5   Semaglutide,0.25 or 0.5MG /DOS, 2 MG/3ML SOPN Inject 0.5 mg weekly under skin     tamsulosin (FLOMAX) 0.4 MG CAPS capsule TAKE 1 CAPSULE BY MOUTH DAILY 100 capsule 2   No current facility-administered medications on file prior to visit.    BP 128/82   Pulse 73   Temp 97.6 F (36.4 C) (Oral)   Ht 5' 10.5" (1.791 m)   Wt 251 lb (113.9 kg)   SpO2 99%   BMI 35.51 kg/m       Objective:   Physical Exam Vitals and nursing note reviewed.  Constitutional:      General: He is not in acute distress.    Appearance: Normal appearance. He is obese. He is not ill-appearing.  HENT:     Head: Normocephalic and atraumatic.     Right Ear: Tympanic membrane, ear canal and external ear normal. There is no impacted cerumen.     Left Ear: Tympanic membrane, ear canal and external ear normal. There is no impacted cerumen.     Nose: Nose normal. No congestion or rhinorrhea.     Mouth/Throat:     Mouth: Mucous membranes are moist.     Pharynx: Oropharynx is clear.  Eyes:     Extraocular Movements: Extraocular movements intact.     Conjunctiva/sclera: Conjunctivae normal.     Pupils: Pupils are equal, round, and reactive to light.  Neck:     Vascular: No carotid bruit.  Cardiovascular:     Rate and Rhythm: Normal rate and regular rhythm.     Pulses: Normal pulses.     Heart sounds: No murmur heard.    No friction rub. No gallop.  Pulmonary:     Effort: Pulmonary effort is normal.     Breath sounds: Normal breath sounds.  Abdominal:     General: Abdomen is flat. Bowel sounds are normal. There is no distension.     Palpations: Abdomen is soft. There is no mass.     Tenderness: There is no abdominal tenderness. There is no guarding or rebound.     Hernia: No hernia is present.  Musculoskeletal:        General: Normal range of  motion.     Cervical back: Normal range of motion and neck supple.  Lymphadenopathy:     Cervical: No cervical adenopathy.  Skin:    General: Skin is warm and dry.     Capillary Refill: Capillary refill takes less than 2 seconds.  Neurological:     General: No focal deficit present.     Mental Status: He is alert and oriented to person, place, and time.  Psychiatric:        Mood and Affect: Mood normal.        Behavior: Behavior normal.        Thought Content: Thought content normal.        Judgment: Judgment normal.       Assessment & Plan:  1. Routine general medical examination at a health care facility (Primary) Today patient counseled on age appropriate routine health concerns for screening and prevention, each reviewed and up to date or declined. Immunizations reviewed and up to date or declined. Labs  ordered and reviewed. Risk factors for depression reviewed and negative. Hearing function and visual acuity are intact. ADLs screened and addressed as needed. Functional ability and level of safety reviewed and appropriate. Education, counseling and referrals performed based on assessed risks today. Patient provided with a copy of personalized plan for preventive services. - Needs weight loss  - Follow up in one year or sooner if needed   2. Atrial fibrillation, unspecified type [facility]) - Per cardiology  - Ambulatory referral to Cardiology - Lipid panel; Future - TSH; Future - CBC; Future - Comprehensive metabolic panel; Future - Comprehensive metabolic panel - CBC - TSH - Lipid panel  3. Diabetes mellitus treated with oral medication (HCC) - Will check kidney function today and likely place on low dose statin - Lipid panel; Future - TSH; Future - CBC; Future - Comprehensive metabolic panel; Future - Comprehensive metabolic panel - CBC - TSH - Lipid panel  4. Hyperlipidemia, unspecified hyperlipidemia type - Continue with statin  - Ambulatory referral to  Cardiology - Lipid panel; Future - TSH; Future - CBC; Future - Comprehensive metabolic panel; Future - Comprehensive metabolic panel - CBC - TSH - Lipid panel  5. Essential hypertension - Controlled.  - Lipid panel; Future - TSH; Future - CBC; Future - Comprehensive metabolic panel; Future - Comprehensive metabolic panel - CBC - TSH - Lipid panel  6. History of non-ST elevation myocardial infarction (NSTEMI) - Continue BB, Eliquis and statin  - Ambulatory referral to Cardiology - Lipid panel; Future - TSH; Future - CBC; Future - Comprehensive metabolic panel; Future - Comprehensive metabolic panel - CBC - TSH - Lipid panel  7. Benign prostatic hyperplasia without lower urinary tract symptoms - Continue with current treatment  - PSA; Future - PSA   Shirline Frees, NP

## 2023-10-24 ENCOUNTER — Other Ambulatory Visit: Payer: Self-pay

## 2023-10-24 MED ORDER — LISINOPRIL 5 MG PO TABS: 5.0000 mg | ORAL_TABLET | Freq: Every day | ORAL | 0 refills | Status: DC

## 2023-11-10 ENCOUNTER — Other Ambulatory Visit (INDEPENDENT_AMBULATORY_CARE_PROVIDER_SITE_OTHER): Payer: Medicare Other

## 2023-11-10 DIAGNOSIS — E1159 Type 2 diabetes mellitus with other circulatory complications: Secondary | ICD-10-CM

## 2023-11-10 DIAGNOSIS — E1165 Type 2 diabetes mellitus with hyperglycemia: Secondary | ICD-10-CM

## 2023-11-10 NOTE — Progress Notes (Signed)
 11/10/2023 Name: Charles [name] MRN: [id number] DOB: 1941-03-21  Chief Complaint  Patient presents with   Diabetes   Medication Management    Charles [name] is a 83 y.o. year old male who presented for a telephone visit.   They were referred to the pharmacist by their PCP for assistance in managing diabetes, hypertension, and complex medication management.    Subjective:  Care Team: Primary Care Provider: Shirline Frees, NP  Medication Access/Adherence  Current Pharmacy:  [facility]) - [place], [place] - [address] [facility] [address] [place] [address] [place] [address] Phone: [phone] Fax: [phone]  [facility] [address] [place]), [place] - [address] [place] [address] Phone: [phone] Fax: [phone]  [facility] - [place], [place] - [address] [place] [address] Phone: [phone] Fax: [phone]   Patient reports affordability concerns with their medications: No  Patient reports access/transportation concerns to their pharmacy: No  Patient reports adherence concerns with their medications:  No  Diabetes:  Current medications: Glipizide 5mg  1 tab at dinner, Ozempic 0.5mg  (not started yet), Metformin 1000mg  BID Medications tried in the past: None  Current glucose readings: 140s in the morning, denies anything below for 100 for a long time Using OneTouch Verio meter; testing 1 time daily - fasting   Patient denies hypoglycemic s/sx including dizziness, shakiness, sweating. Patient denies hyperglycemic symptoms including polyuria, polydipsia, polyphagia, nocturia, neuropathy, blurred vision.  Current meal patterns:  - Breakfast: can vary, can be toast or bowl of cereal, eggs 1-2x/week - Lunch usually go out with a friend, country food  - Supper at home, varies - Snacks frequently, halloween candy right now, chips -  Drinks coffee with sweetener  Current physical activity: goes to the [employer]  Current medication access support: None  Hypertension/Afib:  Current medications: Metoprolol XL 100mg  BID, Eliquis 5mg  BID, Lisinopril 5mg  Medications previously tried: Lisinopril, Spironolactone  Patient does not have a validated, automated, upper arm home BP cuff, [facility] Current blood pressure readings readings: 120/60  Patient denies hypotensive s/sx including dizziness, lightheadedness.  Patient denies hypertensive symptoms including headache, chest pain, shortness of breath  Current meal patterns: see above  Current physical activity: see above   Objective:  Lab Results  Component Value Date   HGBA1C 8.3 (A) 09/19/2023    Lab Results  Component Value Date   CREATININE 1.00 10/22/2023   BUN 15 10/22/2023   NA 140 10/22/2023   K 4.8 10/22/2023   CL 100 10/22/2023   CO2 32 10/22/2023    Lab Results  Component Value Date   CHOL 139 10/22/2023   HDL 44.70 10/22/2023   LDLCALC 57 10/22/2023   LDLDIRECT 139.8 07/08/2014   TRIG 183.0 (H) 10/22/2023   CHOLHDL 3 10/22/2023    Medications Reviewed Today     Reviewed by Sherrill Raring, RPH (Pharmacist) on 11/10/23 at 1028  Med List Status: <None>   Medication Order Taking? Sig Documenting Provider Last Dose Status Informant  atorvastatin (LIPITOR) 40 MG tablet [id number] No Take 1 tablet (40 mg total) by mouth daily at 6 PM.  Patient taking differently: Take 40 mg by mouth daily.   Orpah Cobb, MD Taking Active Self  Cholecalciferol (VITAMIN D-3) 125 MCG (5000 UT) TABS [id number] No Take 5,000 Units by mouth in the morning. [provider] Taking Active Self  Cyanocobalamin (VITAMIN B-12) 5000 MCG SUBL [id number] No Place 5,000 mcg under the tongue every 3 (three) days. [provider] Taking Active Self           Med Note Barnie Mort Jul 07, 2023  9:31 AM) Once a week  ELIQUIS 5 MG TABS  tablet [id number] No Take 5 mg by mouth 2 (two) times daily. [provider] Taking Active Self  EPINEPHrine 0.3 mg/0.3 mL IJ SOAJ injection [id number] No Inject 0.3 mg into the muscle as needed for anaphylaxis. [provider] Taking Active Self           Med Note Alvina Chou   Fri Jul 12, 2022  6:06 AM) Used over 10 years ago.  glipiZIDE (GLUCOTROL) 5 MG tablet [id number] No Take 1 tablet (5 mg total) by mouth daily before supper. Carlus Pavlov, MD Taking Active   glucose blood [facility] VERIO) test strip [id number] No USE 1 STRIP TO CHECK GLUCOSE ONCE DAILY Carlus Pavlov, MD Taking Active   lisinopril (ZESTRIL) 5 MG tablet [id number]  Take 1 tablet (5 mg total) by mouth daily. Nafziger, Kandee Keen, NP  Active   metFORMIN (GLUCOPHAGE) 1000 MG tablet [id number] No Take 1 tablet (1,000 mg total) by mouth 2 (two) times daily with a meal. Carlus Pavlov, MD Taking Active   metoprolol succinate (TOPROL-XL) 100 MG 24 hr tablet [id number] No Take 100 mg by mouth in the morning and at bedtime. [provider] Taking Active Self           Med Note Tiburcio Pea, Jackie Plum Dec 27, 2020  8:38 AM)    Multiple Vitamins-Minerals (PRESERVISION AREDS 2 PO) [id number] No Take 1 tablet by mouth in the morning and at bedtime. [provider] Taking Active Self  nitroGLYCERIN (NITROSTAT) 0.4 MG SL tablet [id number] No Place 1 tablet (0.4 mg total) under the tongue every 5 (five) minutes as needed for chest pain. Orpah Cobb, MD Taking Active Self           Med Note Alvina Chou   Fri Jul 12, 2022  6:07 AM) Never used as of 07/12/22.  ONETOUCH DELICA LANCETS FINE MISC [id number] No Use to check sugar daily Carlus Pavlov, MD Taking Active Self  Semaglutide,0.25 or 0.5MG /DOS, 2 MG/3ML Namon Cirri [id number] No Inject 0.5 mg weekly under skin Carlus Pavlov, MD Taking Active            Med Note Barnie Mort Oct 20, 2023  2:57 PM) Plans to start this week per visit on  2/10  tamsulosin (FLOMAX) 0.4 MG CAPS capsule [id number] No TAKE 1 CAPSULE BY MOUTH DAILY Nafziger, Kandee Keen, NP Taking Active               Assessment/Plan:   Diabetes: - Currently uncontrolled - Reviewed long term cardiovascular and renal outcomes of uncontrolled blood sugar - Reviewed goal A1c, goal fasting, and goal 2 hour post prandial glucose - Reviewed dietary modifications including low carb diets and eating regular meals - Reviewed [facility] modifications including[facility] -Recommend to restart Ozempic as stated by Endo, providing sample - Recommend to check glucose once daily at varying times (fasting and post-prandial, keep a log  Hypertension/Afib: - Currently controlled - Reviewed long term cardiovascular and renal outcomes of uncontrolled blood pressure - Reviewed appropriate blood pressure monitoring technique and reviewed goal blood pressure. Recommended to check home blood pressure and heart rate 2-3x/week -Continue  current medication therapy    Follow Up Plan: 1 month  Sherrill Raring, PharmD Clinical Pharmacist [phone]

## 2023-11-13 DIAGNOSIS — H34811 Central retinal vein occlusion, right eye, with macular edema: Secondary | ICD-10-CM | POA: Diagnosis not present

## 2023-12-10 ENCOUNTER — Other Ambulatory Visit (INDEPENDENT_AMBULATORY_CARE_PROVIDER_SITE_OTHER)

## 2023-12-10 DIAGNOSIS — E1159 Type 2 diabetes mellitus with other circulatory complications: Secondary | ICD-10-CM

## 2023-12-10 DIAGNOSIS — I10 Essential (primary) hypertension: Secondary | ICD-10-CM

## 2023-12-10 DIAGNOSIS — E1165 Type 2 diabetes mellitus with hyperglycemia: Secondary | ICD-10-CM

## 2023-12-10 NOTE — Progress Notes (Signed)
 12/10/2023 Name: Charles [name] MRN: [id number] DOB: May 03, 1941  Chief Complaint  Patient presents with   Diabetes   Medication Management   Hypertension    Charles [name] is a 83 y.o. year old male who presented for a telephone visit.   They were referred to the pharmacist by their PCP for assistance in managing diabetes, hypertension, and complex medication management.    Subjective:  Care Team: Primary Care Provider: Shirline Frees, NP  Medication Access/Adherence  Current Pharmacy:  [facility]) - [place], [place] - [address] [facility] [address] [place] [address] [place] [address] Phone: [phone] Fax: [phone]  [facility] [address] [place]), [place] - [address] [place] ([place] [address] Phone: [phone] Fax: [phone]  [facility] - [place], [place] - [address] [place] [address] Phone: [phone] Fax: [phone]   Patient reports affordability concerns with their medications: No  Patient reports access/transportation concerns to their pharmacy: No  Patient reports adherence concerns with their medications:  No  Diabetes:  Current medications: Glipizide 5mg  1 tab at dinner, Ozempic 0.25mg , Metformin 1000mg  BID Medications tried in the past: None  Current glucose readings: 188 this morning (had soup and crackers before bed). Reports readings have been mostly in the 140-150s range, no lows Using OneTouch Verio meter; testing 1 time daily - fasting   Patient denies hypoglycemic s/sx including dizziness, shakiness, sweating. Patient denies hyperglycemic symptoms including polyuria, polydipsia, polyphagia, nocturia, neuropathy, blurred vision.  Current meal patterns:  - Breakfast: can vary, can be toast or bowl of cereal, eggs 1-2x/week - Lunch usually go out with a friend, country food  - Supper at home, varies -  Snacks frequently, candy and chips - Drinks coffee with sweetener  Current physical activity: goes to the [employer]  Current medication access support: None  Hypertension/Afib:  Current medications: Metoprolol XL 100mg  BID, Eliquis 5mg  BID, Lisinopril 5mg  Medications previously tried: Spironolactone  Patient does not have a validated, automated, upper arm home BP cuff, [facility] Current blood pressure readings readings: 110s-120/60s  Patient denies hypotensive s/sx including dizziness, lightheadedness.  Patient denies hypertensive symptoms including headache, chest pain, shortness of breath  Current meal patterns: see above  Current physical activity: see above   Objective:  Lab Results  Component Value Date   HGBA1C 8.3 (A) 09/19/2023    Lab Results  Component Value Date   CREATININE 1.00 10/22/2023   BUN 15 10/22/2023   NA 140 10/22/2023   K 4.8 10/22/2023   CL 100 10/22/2023   CO2 32 10/22/2023    Lab Results  Component Value Date   CHOL 139 10/22/2023   HDL 44.70 10/22/2023   LDLCALC 57 10/22/2023   LDLDIRECT 139.8 07/08/2014   TRIG 183.0 (H) 10/22/2023   CHOLHDL 3 10/22/2023    Medications Reviewed Today     Reviewed by Sherrill Raring, RPH (Pharmacist) on 12/10/23 at [id number]  Med List Status: <None>   Medication Order Taking? Sig Documenting Provider Last Dose Status Informant  atorvastatin (LIPITOR) 40 MG tablet [id number] Yes Take 1 tablet (40 mg total) by mouth daily at 6 PM.  Patient taking differently: Take 40 mg by mouth daily.   Orpah Cobb, MD Taking Active Self  Cholecalciferol (VITAMIN D-3) 125 MCG (5000 UT) TABS [id number] Yes Take 5,000 Units by mouth in the morning. [provider]  Taking Active Self  Cyanocobalamin (VITAMIN B-12) 5000 MCG SUBL [id number] Yes Place 5,000 mcg under the tongue every 3 (three) days. [provider] Taking Active Self           Med Note Barnie Mort Jul 07, 2023  9:31 AM)  Once a week  ELIQUIS 5 MG TABS tablet [id number] Yes Take 5 mg by mouth 2 (two) times daily. [provider] Taking Active Self  EPINEPHrine 0.3 mg/0.3 mL IJ SOAJ injection [id number]  Inject 0.3 mg into the muscle as needed for anaphylaxis. [provider]  Active Self           Med Note Alvina Chou   Fri Jul 12, 2022  6:06 AM) Used over 10 years ago.  glipiZIDE (GLUCOTROL) 5 MG tablet [id number] Yes Take 1 tablet (5 mg total) by mouth daily before supper. Carlus Pavlov, MD Taking Active   glucose blood [facility] VERIO) test strip [id number]  USE 1 STRIP TO CHECK GLUCOSE ONCE DAILY Carlus Pavlov, MD  Active   lisinopril (ZESTRIL) 5 MG tablet [id number] Yes Take 1 tablet (5 mg total) by mouth daily. Nafziger, Kandee Keen, NP Taking Active   metFORMIN (GLUCOPHAGE) 1000 MG tablet [id number] Yes Take 1 tablet (1,000 mg total) by mouth 2 (two) times daily with a meal. Carlus Pavlov, MD Taking Active   metoprolol succinate (TOPROL-XL) 100 MG 24 hr tablet [id number] Yes Take 100 mg by mouth in the morning and at bedtime. [provider] Taking Active Self           Med Note Tiburcio Pea, Jackie Plum Dec 27, 2020  8:38 AM)    Multiple Vitamins-Minerals (PRESERVISION AREDS 2 PO) [id number] Yes Take 1 tablet by mouth in the morning and at bedtime. [provider] Taking Active Self  nitroGLYCERIN (NITROSTAT) 0.4 MG SL tablet [id number]  Place 1 tablet (0.4 mg total) under the tongue every 5 (five) minutes as needed for chest pain. Orpah Cobb, MD  Active Self           Med Note Alvina Chou   Fri Jul 12, 2022  6:07 AM) Never used as of 07/12/22.  ONETOUCH DELICA LANCETS FINE MISC [id number]  Use to check sugar daily Carlus Pavlov, MD  Active Self  Semaglutide,0.25 or 0.5MG /DOS, 2 MG/3ML Namon Cirri [id number]  Inject 0.5 mg weekly under skin Carlus Pavlov, MD  Active            Med Note Barnie Mort Oct 20, 2023  2:57 PM) Plans to start this week per  visit on 2/10  tamsulosin (FLOMAX) 0.4 MG CAPS capsule [id number] Yes TAKE 1 CAPSULE BY MOUTH DAILY Nafziger, Kandee Keen, NP Taking Active               Assessment/Plan:   Diabetes: - Currently uncontrolled - Reviewed long term cardiovascular and renal outcomes of uncontrolled blood sugar - Reviewed goal A1c, goal fasting, and goal 2 hour post prandial glucose - Reviewed dietary modifications including low carb diets and eating regular meals - Reviewed [facility] modifications including[facility] -Recommend to INCREASE to Ozempic 0.5mg  as instructed by endo (next shot due 12/14/23) -Recommend starting Farxiga for kidney benefit and improved sugar control, patient will discuss with endo at appt on 01/16/24. Price check confirms covered by insurance, $47/month, drops down to no charge by September. Could consider stopping glipizide at that time if started - Recommend to check glucose once  daily at varying times (fasting and post-prandial, keep a log  Hypertension/Afib: - Currently controlled - Reviewed long term cardiovascular and renal outcomes of uncontrolled blood pressure - Reviewed appropriate blood pressure monitoring technique and reviewed goal blood pressure. Recommended to check home blood pressure and heart rate 2-3x/week -Continue current medication therapy    Follow Up Plan: 2 months (sees endo in 1 month) Sherrill Raring, PharmD Clinical Pharmacist [phone]

## 2023-12-25 ENCOUNTER — Other Ambulatory Visit: Payer: Self-pay | Admitting: Adult Health

## 2024-01-05 ENCOUNTER — Ambulatory Visit: Payer: Self-pay

## 2024-01-05 ENCOUNTER — Emergency Department (HOSPITAL_BASED_OUTPATIENT_CLINIC_OR_DEPARTMENT_OTHER)

## 2024-01-05 ENCOUNTER — Emergency Department (HOSPITAL_BASED_OUTPATIENT_CLINIC_OR_DEPARTMENT_OTHER)
Admission: EM | Admit: 2024-01-05 | Discharge: 2024-01-05 | Disposition: A | Discharge status: Home / Self Care | Attending: Emergency Medicine | Admitting: Emergency Medicine

## 2024-01-05 ENCOUNTER — Encounter (HOSPITAL_BASED_OUTPATIENT_CLINIC_OR_DEPARTMENT_OTHER): Payer: Self-pay

## 2024-01-05 ENCOUNTER — Other Ambulatory Visit: Payer: Self-pay

## 2024-01-05 DIAGNOSIS — S20212A Contusion of left front wall of thorax, initial encounter: Secondary | ICD-10-CM | POA: Diagnosis not present

## 2024-01-05 DIAGNOSIS — M25532 Pain in left wrist: Secondary | ICD-10-CM | POA: Insufficient documentation

## 2024-01-05 DIAGNOSIS — W01198A Fall on same level from slipping, tripping and stumbling with subsequent striking against other object, initial encounter: Secondary | ICD-10-CM | POA: Insufficient documentation

## 2024-01-05 DIAGNOSIS — R030 Elevated blood-pressure reading, without diagnosis of hypertension: Secondary | ICD-10-CM

## 2024-01-05 DIAGNOSIS — Z7901 Long term (current) use of anticoagulants: Secondary | ICD-10-CM | POA: Diagnosis not present

## 2024-01-05 DIAGNOSIS — I700 Atherosclerosis of aorta: Secondary | ICD-10-CM | POA: Diagnosis not present

## 2024-01-05 DIAGNOSIS — Y9389 Activity, other specified: Secondary | ICD-10-CM | POA: Diagnosis not present

## 2024-01-05 DIAGNOSIS — S52615A Nondisplaced fracture of left ulna styloid process, initial encounter for closed fracture: Secondary | ICD-10-CM | POA: Diagnosis not present

## 2024-01-05 DIAGNOSIS — R0781 Pleurodynia: Secondary | ICD-10-CM | POA: Diagnosis not present

## 2024-01-05 DIAGNOSIS — Y92007 Garden or yard of unspecified non-institutional (private) residence as the place of occurrence of the external cause: Secondary | ICD-10-CM | POA: Insufficient documentation

## 2024-01-05 DIAGNOSIS — S52572A Other intraarticular fracture of lower end of left radius, initial encounter for closed fracture: Secondary | ICD-10-CM | POA: Diagnosis not present

## 2024-01-05 DIAGNOSIS — Z79899 Other long term (current) drug therapy: Secondary | ICD-10-CM | POA: Insufficient documentation

## 2024-01-05 DIAGNOSIS — S299XXA Unspecified injury of thorax, initial encounter: Secondary | ICD-10-CM | POA: Diagnosis not present

## 2024-01-05 DIAGNOSIS — I10 Essential (primary) hypertension: Secondary | ICD-10-CM | POA: Diagnosis not present

## 2024-01-05 DIAGNOSIS — S63502A Unspecified sprain of left wrist, initial encounter: Secondary | ICD-10-CM | POA: Diagnosis not present

## 2024-01-05 DIAGNOSIS — S52515A Nondisplaced fracture of left radial styloid process, initial encounter for closed fracture: Secondary | ICD-10-CM | POA: Diagnosis not present

## 2024-01-05 DIAGNOSIS — M19032 Primary osteoarthritis, left wrist: Secondary | ICD-10-CM | POA: Diagnosis not present

## 2024-01-05 DIAGNOSIS — W010XXA Fall on same level from slipping, tripping and stumbling without subsequent striking against object, initial encounter: Secondary | ICD-10-CM

## 2024-01-05 DIAGNOSIS — S52612A Displaced fracture of left ulna styloid process, initial encounter for closed fracture: Secondary | ICD-10-CM | POA: Diagnosis not present

## 2024-01-05 DIAGNOSIS — S62102A Fracture of unspecified carpal bone, left wrist, initial encounter for closed fracture: Secondary | ICD-10-CM

## 2024-01-05 DIAGNOSIS — S6992XA Unspecified injury of left wrist, hand and finger(s), initial encounter: Secondary | ICD-10-CM | POA: Diagnosis present

## 2024-01-05 MED ORDER — ACETAMINOPHEN 325 MG PO TABS
650.0000 mg | ORAL_TABLET | Freq: Once | ORAL | Status: AC
  Administered 2024-01-05: 650 mg via ORAL
  Filled 2024-01-05: qty 2
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
## 2024-01-05 NOTE — Discharge Instructions (Addendum)
 It was our pleasure to provide your ER care today - we hope that you feel better.  Fall precautions. Take acetaminophen  as need.   The xrays show a non-displaced fracture of your left wrist radial and ulnar styloid - wear splint, elevate, icepack, and follow up with orthopedic hand doctor in one week - call office to arrange the follow up appointment with them.   Follow up with primary care doctor in one week if symptoms fail to improve/resolve.  Also follow up with primary care doctor regarding your blood pressure that is high today.   Return to ER if worse, new symptoms, new/severe pain, severe headache, persistent/recurrent chest pain, increased trouble breathing, or other concern.

## 2024-01-05 NOTE — ED Triage Notes (Signed)
 Pt c/o mechanical fall "over the weekend, kind of messed up my L wrist." Advises he "poked L ribcage, hit head in grass." Denies HA, blurred vision, NV. Concern for ribs, wrist.  Compliant w home eliquis

## 2024-01-05 NOTE — ED Notes (Signed)

## 2024-01-05 NOTE — ED Provider Notes (Addendum)
 [place] EMERGENCY DEPARTMENT AT [facility] Provider Note   CSN: [id number] Arrival date & time: 01/05/24  1154     History  Chief Complaint  Patient presents with   Charles [name] is a 83 y.o. male.  Patient c/o fall two days ago. Was working it yard, [employer] hole to plan something, fell towards side, mechanical fall. No faintness or dizziness prior to fall. Indicates hit left chest wall on wheelbarrow. Denies hitting head or loc. No headache since fall. Is on eliquis, no abnormal bruising or bleeding. No neck or back pain. C/o left lateral chest wall pain where he hit chest, worse w certain movements or palpation. No cough or uri  symptoms. No fever or chills. No exertional  chest pain. No sob or unusual doe. Skin tear right forearm. Tetanus is up to date. Denies other pain or injury.   The history is provided by the patient and medical records.  Fall Pertinent negatives include no abdominal pain, no headaches and no shortness of breath.       Home Medications Prior to Admission medications   Medication Sig Start Date End Date Taking? Authorizing Provider  atorvastatin  (LIPITOR) 40 MG tablet Take 1 tablet (40 mg total) by mouth daily at 6 PM. Patient taking differently: Take 40 mg by mouth daily. 10/22/15   Pasqual Bone, MD  Cholecalciferol  (VITAMIN D -3) 125 MCG (5000 UT) TABS Take 5,000 Units by mouth in the morning.    [provider]  Cyanocobalamin  (VITAMIN B-12) 5000 MCG SUBL Place 5,000 mcg under the tongue every 3 (three) days.    [provider]  ELIQUIS 5 MG TABS tablet Take 5 mg by mouth 2 (two) times daily. 10/16/20   [provider]  EPINEPHrine  0.3 mg/0.3 mL IJ SOAJ injection Inject 0.3 mg into the muscle as needed for anaphylaxis.    [provider]  glipiZIDE  (GLUCOTROL ) 5 MG tablet Take 1 tablet (5 mg total) by mouth daily before supper. 09/19/23   Emilie Harden, MD  glucose blood (ONETOUCH VERIO) test  strip USE 1 STRIP TO CHECK GLUCOSE ONCE DAILY 01/14/23   Emilie Harden, MD  lisinopril  (ZESTRIL ) 5 MG tablet TAKE 1 TABLET BY MOUTH DAILY 12/25/23   Nafziger, Randel Buss, NP  metFORMIN  (GLUCOPHAGE ) 1000 MG tablet Take 1 tablet (1,000 mg total) by mouth 2 (two) times daily with a meal. 09/19/23   Emilie Harden, MD  metoprolol  succinate (TOPROL -XL) 100 MG 24 hr tablet Take 100 mg by mouth in the morning and at bedtime. 02/02/16   [provider]  Multiple Vitamins-Minerals (PRESERVISION AREDS 2 PO) Take 1 tablet by mouth in the morning and at bedtime.    [provider]  nitroGLYCERIN  (NITROSTAT ) 0.4 MG SL tablet Place 1 tablet (0.4 mg total) under the tongue every 5 (five) minutes as needed for chest pain. 10/22/15   Pasqual Bone, MD  [facility] - B DELICA LANCETS FINE MISC Use to check sugar daily 11/25/16   Emilie Harden, MD  Semaglutide ,0.25 or 0.5MG /DOS, 2 MG/3ML SOPN Inject 0.5 mg weekly under skin 09/19/23   Emilie Harden, MD  tamsulosin  (FLOMAX ) 0.4 MG CAPS capsule TAKE 1 CAPSULE BY MOUTH DAILY 06/03/23   Alto Atta, NP      Allergies    Bee venom    Review of Systems   Review of Systems  Constitutional:  Negative for fever.  HENT:  Negative for nosebleeds.   Respiratory:  Negative for shortness of breath.   Gastrointestinal:  Negative for abdominal pain, nausea and vomiting.  Genitourinary:  Negative for flank pain.  Musculoskeletal:  Negative for back pain and neck pain.  Skin:  Positive for wound.  Neurological:  Negative for weakness, numbness and headaches.  Psychiatric/Behavioral:  Negative for confusion.     Physical Exam Updated Vital Signs BP (!) 151/100   Pulse 88   Temp 97.9 F (36.6 C)   Resp 16   SpO2 96%  Physical Exam Vitals and nursing note reviewed.  Constitutional:      Appearance: Normal appearance. He is well-developed.  HENT:     Head: Atraumatic.     Comments: No head or scalp contusion, sts or bruising.     Nose: Nose normal.      Mouth/Throat:     Mouth: Mucous membranes are moist.     Pharynx: Oropharynx is clear.  Eyes:     General: No scleral icterus.    Conjunctiva/sclera: Conjunctivae normal.     Pupils: Pupils are equal, round, and reactive to light.  Neck:     Trachea: No tracheal deviation.  Cardiovascular:     Rate and Rhythm: Normal rate and regular rhythm.     Pulses: Normal pulses.     Heart sounds: Normal heart sounds. No murmur heard.    No friction rub. No gallop.  Pulmonary:     Effort: Pulmonary effort is normal. No accessory muscle usage or respiratory distress.     Breath sounds: Normal breath sounds.     Comments: Left lateral chest wall tenderness reproducing his symptoms. No crepitus. Normal chest movement.  Abdominal:     General: Bowel sounds are normal. There is no distension.     Palpations: Abdomen is soft.     Tenderness: There is no abdominal tenderness. There is no guarding.     Comments: No abd bruising or contusion.   Musculoskeletal:        General: No swelling or tenderness.     Cervical back: Normal range of motion and neck supple. No rigidity or tenderness.     Right lower leg: No edema.     Left lower leg: No edema.     Comments: Superficial skin tear right forearm, ~ 2 cm diameter. No infection to area. Mild sts and tenderness left wrist, no focal scaphoid tenderness, skin intact, mild bruising. Other than above, no good rom bil extremities without other focal pain or focal bony tenderness. CTLS spine, non tender, aligned, no step off.   Skin:    General: Skin is warm and dry.     Findings: No rash.  Neurological:     Mental Status: He is alert.     Comments: Alert, speech clear.  GCS 15. Motor/sens grossly intact bil.   Psychiatric:        Mood and Affect: Mood normal.     ED Results / Procedures / Treatments   Labs (all labs ordered are listed, but only abnormal results are displayed) Labs Reviewed - No data to display  EKG None  Radiology DG Wrist  Complete Left Result Date: 01/05/2024 CLINICAL DATA:  Marvell Slider, pain EXAM: LEFT WRIST - COMPLETE 3+ VIEW COMPARISON:  None Available. FINDINGS: Frontal, oblique, lateral, and ulnar deviated views of the left wrist are obtained. There is a small comminuted intra-articular fracture involving the distal aspect of the radial styloid. Small nondisplaced fracture through the tip of the ulnar styloid. Mild diffuse osteoarthritis greatest within the radial aspect of the carpus. Diffuse soft tissue edema.  IMPRESSION: 1. Small comminuted intra-articular fracture at the distal margin of the radial styloid, with near anatomic alignment. 2. Small nondisplaced fracture through the tip of the ulnar styloid. 3. Diffuse soft tissue swelling. 4. Mild osteoarthritis. Electronically Signed   By: Bobbye Burrow M.D.   On: 01/05/2024 15:43   CT Chest Wo Contrast Result Date: 01/05/2024 CLINICAL DATA:  Chest trauma, blunt r/o left rib fractures. Fall. Left rib pain. EXAM: CT CHEST WITHOUT CONTRAST TECHNIQUE: Multidetector CT imaging of the chest was performed following the standard protocol without IV contrast. RADIATION DOSE REDUCTION: This exam was performed according to the departmental dose-optimization program which includes automated exposure control, adjustment of the mA and/or kV according to patient size and/or use of iterative reconstruction technique. COMPARISON:  None Available. FINDINGS: Cardiovascular: Heart is normal size. Aorta is normal caliber. Extensive 3 vessel coronary artery disease. Aortic atherosclerosis. Mediastinum/Nodes: No mediastinal, hilar, or axillary adenopathy. Trachea and esophagus are unremarkable. Thyroid  unremarkable. Lungs/Pleura: Lungs are clear. No focal airspace opacities or suspicious nodules. No effusions. No pneumothorax. Upper Abdomen: No acute findings Musculoskeletal: Chest wall soft tissues are unremarkable. No acute bony abnormality. IMPRESSION: No acute cardiopulmonary disease. No evidence  of significant traumatic injury. Aortic Atherosclerosis (ICD10-I70.0).  Coronary artery disease. Electronically Signed   By: Janeece Mechanic M.D.   On: 01/05/2024 14:13    Procedures Procedures    Medications Ordered in ED Medications  acetaminophen  (TYLENOL ) tablet 650 mg (650 mg Oral Given 01/05/24 1300)    ED Course/ Medical Decision Making/ A&P Clinical Course as of 01/05/24 1551  Mon Jan 05, 2024  1512 Stable 82 YOM with  a GLF 2 days ago. Did not hit head. Chest pain and wrist pain.  CT chest ok. [CC]  1548 Distal radius and ulna fx Wrist brace and hand follow up  [CC]    Clinical Course User Index [CC] Onetha Bile, MD                                 Medical Decision Making Problems Addressed: Contusion of left chest wall, initial encounter: acute illness or injury with systemic symptoms that poses a threat to life or bodily functions Elevated blood pressure reading: acute illness or injury Essential hypertension: chronic illness or injury with exacerbation, progression, or side effects of treatment that poses a threat to life or bodily functions Fall from slip, trip, or stumble, initial encounter: acute illness or injury with systemic symptoms that poses a threat to life or bodily functions Nondisplaced fracture of left radial styloid process, initial encounter for closed fracture: acute illness or injury Sprain of left wrist, initial encounter: acute illness or injury Traumatic closed fracture of ulnar styloid with minimal displacement, left, initial encounter: acute illness or injury  Amount and/or Complexity of Data Reviewed External Data Reviewed: notes. Radiology: ordered and independent interpretation performed. Decision-making details documented in ED Course.  Risk OTC drugs.   Imaging ordered.   Reviewed nursing notes and prior charts for additional history.   CT reviewed/interpreted by me - no ptx or pna. No fxs noted.   Acetaminophen  po.    Recheck pt comfortable, no distress. Pulse ox 97% room air.   Imaging of left wrist pending - reviewed, ulnar and radial styloid fx. Discussed w pt. Wrist splint. Ortho hand f/u.           Final Clinical Impression(s) / ED Diagnoses Final diagnoses:  Fall from slip,  trip, or stumble, initial encounter  Contusion of left chest wall, initial encounter  Elevated blood pressure reading  Essential hypertension  Sprain of left wrist, initial encounter  Traumatic closed fracture of ulnar styloid with minimal displacement, left, initial encounter  Nondisplaced fracture of left radial styloid process, initial encounter for closed fracture  Closed fracture of left wrist, initial encounter    Rx / DC Orders ED Discharge Orders     None         Guadalupe Lee, MD 01/05/24 1552

## 2024-01-07 ENCOUNTER — Ambulatory Visit: Payer: Medicare Other | Admitting: *Deleted

## 2024-01-07 DIAGNOSIS — Z0000 Encounter for general adult medical examination without abnormal findings: Secondary | ICD-10-CM

## 2024-01-07 NOTE — Progress Notes (Signed)
 Subjective:   Charles [name] is a 83 y.o. male who presents for Medicare Annual/Subsequent preventive examination.  Visit Complete: Virtual I connected with  Feliciano G Couper on 01/07/24 by a audio enabled telemedicine application and verified that I am speaking with the correct person using two identifiers.  Patient Location: Home  Provider Location: Home Office  I discussed the limitations of evaluation and management by telemedicine. The patient expressed understanding and agreed to proceed.  Vital Signs: Because this visit was a virtual/telehealth visit, some criteria may be missing or patient reported. Any vitals not documented were not able to be obtained and vitals that have been documented are patient reported.  Patient Medicare AWV questionnaire was completed by the patient on 01-05-2024; I have confirmed that all information answered by patient is correct and no changes since this date.  Cardiac Risk Factors include: advanced age (>88men, >30 women);diabetes mellitus;male gender;hypertension     Objective:    Today's Vitals   01/03/24 1726  PainSc: 4    There is no height or weight on file to calculate BMI.     01/07/2024    8:16 AM 01/01/2023    9:56 AM 07/12/2022    6:13 AM 12/26/2021    9:06 AM 01/25/2021   10:14 AM 01/08/2021    8:10 AM 12/20/2020    9:06 AM  Advanced Directives  Does Patient Have a Medical Advance Directive? Yes Yes Yes Yes Yes Yes Yes  Type of Advance Directive Living will Healthcare Power of [place];Living will Healthcare Power of [place];Living will Healthcare Power of [place];Living will Healthcare Power of [place];Living will Healthcare Power of [employer] Power of Attorney  Does patient want to make changes to medical advance directive?   No - Patient declined No - Patient declined No - Patient declined    Copy of Healthcare Power of Attorney in Chart?  No - copy requested No - copy requested No - copy requested No - copy requested  No  - copy requested    Current Medications (verified) Outpatient Encounter Medications as of 01/07/2024  Medication Sig   atorvastatin  (LIPITOR) 40 MG tablet Take 1 tablet (40 mg total) by mouth daily at 6 PM. (Patient taking differently: Take 40 mg by mouth daily.)   Cholecalciferol  (VITAMIN D -3) 125 MCG (5000 UT) TABS Take 5,000 Units by mouth in the morning.   Cyanocobalamin  (VITAMIN B-12) 5000 MCG SUBL Place 5,000 mcg under the tongue every 3 (three) days.   ELIQUIS 5 MG TABS tablet Take 5 mg by mouth 2 (two) times daily.   EPINEPHrine  0.3 mg/0.3 mL IJ SOAJ injection Inject 0.3 mg into the muscle as needed for anaphylaxis.   glipiZIDE  (GLUCOTROL ) 5 MG tablet Take 1 tablet (5 mg total) by mouth daily before supper.   glucose blood (ONETOUCH VERIO) test strip USE 1 STRIP TO CHECK GLUCOSE ONCE DAILY   lisinopril  (ZESTRIL ) 5 MG tablet TAKE 1 TABLET BY MOUTH DAILY   metFORMIN  (GLUCOPHAGE ) 1000 MG tablet Take 1 tablet (1,000 mg total) by mouth 2 (two) times daily with a meal.   metoprolol  succinate (TOPROL -XL) 100 MG 24 hr tablet Take 100 mg by mouth in the morning and at bedtime.   Multiple Vitamins-Minerals (PRESERVISION AREDS 2 PO) Take 1 tablet by mouth in the morning and at bedtime.   nitroGLYCERIN  (NITROSTAT ) 0.4 MG SL tablet Place 1 tablet (0.4 mg total) under the tongue every 5 (five) minutes as needed for chest pain.   ONETOUCH DELICA LANCETS FINE  MISC Use to check sugar daily   Semaglutide ,0.25 or 0.5MG /DOS, 2 MG/3ML SOPN Inject 0.5 mg weekly under skin   tamsulosin  (FLOMAX ) 0.4 MG CAPS capsule TAKE 1 CAPSULE BY MOUTH DAILY   No facility-administered encounter medications on file as of 01/07/2024.    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   Coronary  artery disease    Dysrhythmia    PAF   Eczema    GERD (gastroesophageal reflux disease)    Hernia, ventral    History of blood transfusion    Hyperlipidemia    Hypertension    Myocardial infarction (HCC)    Neuropathy    Obesity    Personal history of adenomatous colonic polyps 07/20/2012   3 + adenomas 2009 07/20/2012 2 diminutive polyps     Pneumonia 1942; 1950s X 1   Type II diabetes mellitus (HCC)    type II   Walking pneumonia 2000's X 1   Past Surgical History:  Procedure Laterality Date   A-FLUTTER ABLATION N/A 07/12/2022   Procedure: A-FLUTTER ABLATION;  Surgeon: Efraim Grange, MD;  Location: [facility] INVASIVE CV LAB;  Service: Cardiovascular;  Laterality: N/A;   ATRIAL FIBRILLATION ABLATION N/A 07/12/2022   Procedure: ATRIAL FIBRILLATION ABLATION;  Surgeon: Efraim Grange, MD;  Location: [facility] INVASIVE CV LAB;  Service: Cardiovascular;  Laterality: N/A;   CARDIAC CATHETERIZATION N/A 10/19/2015   Procedure: Left Heart Cath and Coronary Angiography;  Surgeon: Chapman Commodore, MD;  Location[facility] INVASIVE CV LAB;  Service: Cardiovascular;  Laterality: N/A;   CARDIAC CATHETERIZATION N/A 10/19/2015   Procedure: Coronary Stent Intervention;  Surgeon: Chapman Commodore, MD;  Location: [facility] INVASIVE CV LAB;  Service: Cardiovascular;  Laterality: N/A;   CATARACT EXTRACTION, BILATERAL Bilateral 01/2018   Dr. Matthew Songster did surgery and he developed MD and is seeing specialist now for MD treatment   COLONOSCOPY     "nothing showed up this time"   COLONOSCOPY W/ BIOPSIES AND POLYPECTOMY  X 1   CORONARY ANGIOPLASTY     EXCISION MASS NECK Left 01/08/2021   Procedure: EXCISION MASS NECK;  Surgeon: Virgina Grills, MD;  Location[facility];  Service: ENT;  Laterality: Left;   FEMUR IM NAIL Right 07/26/2013   Procedure: INTRAMEDULLARY (IM) NAIL FEMORAL subtrochanteric;  Surgeon: Bevin Bucks, MD;  Location: [facility] OR;  Service: Orthopedics;  Laterality: Right;   FRACTURE SURGERY     INGUINAL HERNIA REPAIR Left  >3 times   INGUINAL HERNIA REPAIR Right 2012   NEPHRECTOMY Left 2000   TONSILLECTOMY  1940s   Family History  Problem Relation Age of Onset   Drug abuse Other    Cancer Other    Heart disease Other    Lung disease Other    Diabetes Mother    Diabetes Sister    Colon cancer Neg Hx    Stomach cancer Neg Hx    Social History   Socioeconomic History   Marital status: Widowed    Spouse name: Not on file   Number of children: 3   Years of education: Not on file   Highest education level: Master's degree (e.g., MA, MS, MEng, MEd, MSW, MBA)  Occupational History   Occupation: retired  Tobacco Use   Smoking status: Former    Current packs/day: 0.00    Types: Cigarettes, Pipe, Cigars    Start date: 09/09/1948    Quit date: 09/09/1968    Years since quitting: 55.3   Smokeless tobacco: Never   Tobacco comments:    smoking cigars- quit in 2006  , quit pipe 2000  Vaping Use   Vaping status: Never Used  Substance and Sexual Activity   Alcohol use: Not Currently    Alcohol/week: 2.0 standard drinks of alcohol    Types: 2 Glasses of wine per week    Comment: 2 glasses on Tuesday   Drug use: No   Sexual activity: Never  Other Topics Concern   Not on file  Social History Narrative   Regular exercise: goes to the [facility] and walks   Caffeine use: daily; coffee   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     widowed    Three children, One in [place], one in [place] ,and one in [place]    HH - 2 : youngest daughter lives with him   Social Drivers of Corporate investment banker Strain: Low Risk  (01/07/2024)   Overall Financial Resource Strain (CARDIA)    Difficulty of Paying Living Expenses: Not hard at all  Food Insecurity: No Food Insecurity (01/07/2024)   Hunger Vital Sign    Worried About Running Out of Food in the Last Year: Never true    Ran Out of Food in the Last Year: Never true  Transportation Needs: No Transportation Needs (01/07/2024)   PRAPARE - Therapist, art (Medical): No    Lack of Transportation (Non-Medical): No  Physical Activity: Sufficiently Active (01/07/2024)   Exercise Vital Sign    Days of Exercise per Week: 5 days    Minutes of Exercise per Session: 40 min  Stress: No Stress Concern Present (01/07/2024)   [employer] of Occupational Health - Occupational Stress Questionnaire    Feeling of Stress : Not at all  Social Connections: Moderately Isolated (01/07/2024)   Social Connection and Isolation Panel [NHANES]    Frequency of Communication with Friends and Family: More than three times a week    Frequency of Social Gatherings with Friends and Family: More than three times a week    Attends Religious Services: More than 4 times per year    Active Member of [employer] or Organizations: No    Attends Banker Meetings: Never    Marital Status: Widowed    Tobacco Counseling Counseling given: Not Answered Tobacco comments: smoking cigars- quit in 2006  , quit pipe 2000   Clinical Intake:  Pre-visit preparation completed: Yes  Pain : 0-10 Pain Score: 4  Pain Type: Acute pain Pain Location: Chest Pain Descriptors / Indicators: Aching, Dull Pain Onset: In the past 7 days Pain Frequency: Intermittent     Diabetes: Yes CBG done?: No Did pt. bring in CBG monitor from home?: No  How often do you need to have someone help you when you read instructions, pamphlets, or other written materials from your doctor or pharmacy?: 1 - Never  Interpreter Needed?: No  Information entered by :: Kieth Pelt LPN   Activities of Daily Living    01/07/2024    8:16 AM 01/03/2024    5:26 PM  In your present state of health, do you have any difficulty performing the following activities:  Hearing? 0 0  Vision? 0 0  Difficulty concentrating  or making decisions? 0 0  Walking or climbing stairs? 0 0  Dressing or bathing? 0 0  Doing errands, shopping? 0 0  Preparing Food and eating ? N N  Using the  Toilet? N N  In the past six months, have you accidently leaked urine? N N  Do you have problems with loss of bowel control? N N  Managing your Medications? N N  Managing your Finances? N N  Housekeeping or managing your Housekeeping? N N    Patient Care Team: Alto Atta, NP as PCP - General (Family Medicine) Mealor, Donnamae Gaba, MD as PCP - Electrophysiology (Cardiology) Virgina Grills, MD as Consulting Physician (Otolaryngology) Chapman Commodore, MD as Consulting Physician (Cardiology) Emilie Harden, MD as Consulting Physician (Internal Medicine) Carnell Christian, [facility] (Pharmacist)  Indicate any recent Medical Services you may have received from other than [facility] providers in the past year (date may be approximate).     Assessment:   This is a routine wellness examination for [place].  Hearing/Vision screen Hearing Screening - Comments:: Hearing aids bilateral  Vision Screening - Comments:: Up to date [facility] every 8 weeks shot   Goals Addressed             This Visit's Progress    Patient Stated       Stay healthy       Depression Screen    01/07/2024    8:19 AM 01/01/2023    9:52 AM 12/26/2021    9:00 AM 10/09/2021    9:38 AM 12/20/2020    9:05 AM 10/18/2020    9:55 AM 10/06/2020    9:10 AM  PHQ 2/9 Scores  PHQ - 2 Score 0 0 0 0 0 0 0  PHQ- 9 Score 4          Fall Risk    01/07/2024    8:13 AM 01/03/2024    5:26 PM 05/08/2023   10:25 PM 01/01/2023    9:56 AM 01/01/2023    9:55 AM  Fall Risk   Falls in the past year? 1 1 1 1 1   Number falls in past yr: 1 1 1  0 0  Injury with Fall? 1 1 1  0 0  Risk for fall due to :    No Fall Risks No Fall Risks  Follow up Falls evaluation completed;Education provided;Falls prevention discussed   Falls prevention discussed Falls prevention discussed    MEDICARE RISK AT HOME: Medicare Risk at Home Any stairs in or around the home?: Yes If so, are there any without handrails?: Yes Home free of loose throw rugs in walkways,  pet beds, electrical cords, etc?: Yes Adequate lighting in your home to reduce risk of falls?: Yes Life alert?: No Use of a cane, walker or w/c?: No Grab bars in the bathroom?: No Shower chair or bench in shower?: Yes Elevated toilet seat or a handicapped toilet?: No  TIMED UP AND GO:  Was the test performed?  No    Cognitive Function:    06/09/2018   10:49 AM 06/06/2017   10:45 AM  MMSE - Mini Mental State Exam  Not completed: -- --        01/07/2024    8:17 AM 01/01/2023    9:57 AM 12/26/2021    9:06 AM 12/20/2020    9:10 AM 10/12/2019    8:46 AM  6CIT Screen  What Year? 0 points 0 points 0 points 0 points 0 points  What month? 0 points 0 points  0 points 0 points 0 points  What time? 0 points 0 points 0 points  0 points  Count back from 20 0 points 0 points 0 points 0 points 0 points  Months in reverse 0 points 0 points 0 points 0 points 0 points  Repeat phrase 0 points 0 points 0 points 0 points 0 points  Total Score 0 points 0 points 0 points  0 points    Immunizations Immunization History  Administered Date(s) Administered   Fluad Quad(high Dose 65+) 07/05/2019, 06/18/2021   Influenza Split 07/18/2011, 07/06/2012, 07/07/2012   Influenza Whole 06/09/2006, 06/17/2008, 07/10/2009, 05/18/2010   Influenza, High Dose Seasonal PF 06/11/2016, 06/06/2017, 05/26/2018   Influenza,inj,Quad PF,6+ Mos 06/14/2013, 06/02/2014, 07/14/2015   Influenza-Unspecified 06/27/2020, 06/10/2022   PFIZER(Purple Top)SARS-COV-2 Vaccination 09/28/2019, 10/19/2019, 06/18/2020   Pneumococcal Conjugate-13 07/14/2014   Pneumococcal Polysaccharide-23 06/09/2006   Td 09/09/2001   Tdap 02/20/2012, 10/12/2022   Zoster Recombinant(Shingrix) 09/15/2021, 11/24/2021    TDAP status: Up to date  Flu Vaccine status: Up to date  Pneumococcal vaccine status: Up to date  Covid-19 vaccine status: Information provided on how to obtain vaccines.   Qualifies for Shingles Vaccine? No   Zostavax completed Yes    Shingrix Completed?: Yes  Screening Tests Health Maintenance  Topic Date Due   COVID-19 Vaccine (4 - 2024-25 season) 05/11/2023   OPHTHALMOLOGY EXAM  01/22/2024   HEMOGLOBIN A1C  03/18/2024   INFLUENZA VACCINE  04/09/2024   Diabetic kidney evaluation - Urine ACR  10/01/2024   Diabetic kidney evaluation - eGFR measurement  10/21/2024   FOOT EXAM  10/21/2024   Medicare Annual Wellness (AWV)  01/06/2025   DTaP/Tdap/Td (4 - Td or Tdap) 10/12/2032   Pneumonia Vaccine 47+ Years old  Completed   Zoster Vaccines- Shingrix  Completed   HPV VACCINES  Aged Out   Meningococcal B Vaccine  Aged Out   Colonoscopy  Discontinued    Health Maintenance  Health Maintenance Due  Topic Date Due   COVID-19 Vaccine (4 - 2024-25 season) 05/11/2023    Colorectal cancer screening: No longer required.   Lung Cancer Screening: (Low Dose CT Chest recommended if Age 75-80 years, 20 pack-year currently smoking OR have quit w/in 15years.) does not qualify.   Lung Cancer Screening Referral:   Additional Screening:  Hepatitis C Screening: does not qualify;   Vision Screening: Recommended annual ophthalmology exams for early detection of glaucoma and other disorders of the eye. Is the patient up to date with their annual eye exam?  Yes  Who is the provider or what is the name of the office in which the patient attends annual eye exams? [facility] If pt is not established with a provider, would they like to be referred to a provider to establish care? No .   Dental Screening: Recommended annual dental exams for proper oral hygiene  Nutrition Risk Assessment:  Has the patient had any N/V/D within the last 2 months?  No  Does the patient have any non-healing wounds?  No  Has the patient had any unintentional weight loss or weight gain?  No   Diabetes:  Is the patient diabetic?  Yes  If diabetic, was a CBG obtained today?  No  Did the patient bring in their glucometer from home?  No  How often do you  monitor your CBG's? 1 x a day.   Financial Strains and Diabetes Management:  Are you having any financial strains with the device, your supplies or your medication? No .  Does the patient want to be seen by Chronic Care Management for management of their diabetes?  No  Would the patient like to be referred to a Nutritionist or for Diabetic Management?  No   Diabetic Exams:  Diabetic Eye Exam: Completed .Pt has been advised about the importance in completing this exam  Diabetic Foot Exam: . Pt has been advised about the importance in completing this exam. .    Community Resource Referral / Chronic Care Management: CRR required this visit?  No   CCM required this visit?  No     Plan:     I have personally reviewed and noted the following in the patient's chart:   Medical and social history Use of alcohol, tobacco or illicit drugs  Current medications and supplements including opioid prescriptions. Patient is not currently taking opioid prescriptions. Functional ability and status Nutritional status Physical activity Advanced directives List of other physicians Hospitalizations, surgeries, and ER visits in previous 12 months Vitals Screenings to include cognitive, depression, and falls Referrals and appointments  In addition, I have reviewed and discussed with patient certain preventive protocols, quality metrics, and best practice recommendations. A written personalized care plan for preventive services as well as general preventive health recommendations were provided to patient.     Kieth Pelt, LPN   1/61/0960   After Visit Summary: (MyChart) Due to this being a telephonic visit, the after visit summary with patients personalized plan was offered to patient via MyChart   Nurse Notes:  patient had a fall was seen at [facility] will book his appointment today for Dr. Primus Brookes  patient  has phone number and address.  States he is feeling a little better.  Did go over wound  car for cut on his arm.

## 2024-01-07 NOTE — Patient Instructions (Signed)
 Mr. Charles [name] , Thank you for taking time to come for your Medicare Wellness Visit. I appreciate your ongoing commitment to your health goals. Please review the following plan we discussed and let me know if I can assist you in the future.   Screening recommendations/referrals: Colonoscopy: no longer required Recommended yearly ophthalmology/optometry visit for glaucoma screening and checkup Recommended yearly dental visit for hygiene and checkup  Vaccinations: Influenza vaccine: up to date Pneumococcal vaccine: up to date Tdap vaccine: up tod ate Shingles vaccine: up to date       Preventive Care 65 Years and Older, Male Preventive care refers to lifestyle choices and visits with your health care provider that can promote health and wellness. What does preventive care include? A yearly physical exam. This is also called an annual well check. Dental exams once or twice a year. Routine eye exams. Ask your health care provider how often you should have your eyes checked. Personal lifestyle choices, including: Daily care of your teeth and gums. Regular physical activity. Eating a healthy diet. Avoiding tobacco and drug use. Limiting alcohol use. Practicing safe sex. Taking low doses of aspirin  every day. Taking vitamin and mineral supplements as recommended by your health care provider. What happens during an annual well check? The services and screenings done by your health care provider during your annual well check will depend on your age, overall health, lifestyle risk factors, and family history of disease. Counseling  Your health care provider may ask you questions about your: Alcohol use. Tobacco use. Drug use. Emotional well-being. Home and relationship well-being. Sexual activity. Eating habits. History of falls. Memory and ability to understand (cognition). Work and work Astronomer. Screening  You may have the following tests or measurements: Height, weight, and  BMI. Blood pressure. Lipid and cholesterol levels. These may be checked every 5 years, or more frequently if you are over 29 years old. Skin check. Lung cancer screening. You may have this screening every year starting at age 23 if you have a 30-pack-year history of smoking and currently smoke or have quit within the past 15 years. Fecal occult blood test (FOBT) of the stool. You may have this test every year starting at age 30. Flexible sigmoidoscopy or colonoscopy. You may have a sigmoidoscopy every 5 years or a colonoscopy every 10 years starting at age 8. Prostate cancer screening. Recommendations will vary depending on your family history and other risks. Hepatitis C blood test. Hepatitis B blood test. Sexually transmitted disease (STD) testing. Diabetes screening. This is done by checking your blood sugar (glucose) after you have not eaten for a while (fasting). You may have this done every 1-3 years. Abdominal aortic aneurysm (AAA) screening. You may need this if you are a current or former smoker. Osteoporosis. You may be screened starting at age 3 if you are at high risk. Talk with your health care provider about your test results, treatment options, and if necessary, the need for more tests. Vaccines  Your health care provider may recommend certain vaccines, such as: Influenza vaccine. This is recommended every year. Tetanus, diphtheria, and acellular pertussis (Tdap, Td) vaccine. You may need a Td booster every 10 years. Zoster vaccine. You may need this after age 81. Pneumococcal 13-valent conjugate (PCV13) vaccine. One dose is recommended after age 56. Pneumococcal polysaccharide (PPSV23) vaccine. One dose is recommended after age 39. Talk to your health care provider about which screenings and vaccines you need and how often you need them. This information is not  intended to replace advice given to you by your health care provider. Make sure you discuss any questions you have  with your health care provider. Document Released: 09/22/2015 Document Revised: 05/15/2016 Document Reviewed: 06/27/2015 Elsevier Interactive Patient Education  2017 [employer].  Fall Prevention in the Home Falls can cause injuries. They can happen to people of all ages. There are many things you can do to make your home safe and to help prevent falls. What can I do on the outside of my home? Regularly fix the edges of walkways and driveways and fix any cracks. Remove anything that might make you trip as you walk through a door, such as a raised step or threshold. Trim any bushes or trees on the path to your home. Use bright outdoor lighting. Clear any walking paths of anything that might make someone trip, such as rocks or tools. Regularly check to see if handrails are loose or broken. Make sure that both sides of any steps have handrails. Any raised decks and porches should have guardrails on the edges. Have any leaves, snow, or ice cleared regularly. Use sand or salt on walking paths during winter. Clean up any spills in your garage right away. This includes oil or grease spills. What can I do in the bathroom? Use night lights. Install grab bars by the toilet and in the tub and shower. Do not use towel bars as grab bars. Use non-skid mats or decals in the tub or shower. If you need to sit down in the shower, use a plastic, non-slip stool. Keep the floor dry. Clean up any water that spills on the floor as soon as it happens. Remove soap buildup in the tub or shower regularly. Attach bath mats securely with double-sided non-slip rug tape. Do not have throw rugs and other things on the floor that can make you trip. What can I do in the bedroom? Use night lights. Make sure that you have a light by your bed that is easy to reach. Do not use any sheets or blankets that are too big for your bed. They should not hang down onto the floor. Have a firm chair that has side arms. You can use  this for support while you get dressed. Do not have throw rugs and other things on the floor that can make you trip. What can I do in the kitchen? Clean up any spills right away. Avoid walking on wet floors. Keep items that you use a lot in easy-to-reach places. If you need to reach something above you, use a strong step stool that has a grab bar. Keep electrical cords out of the way. Do not use floor polish or wax that makes floors slippery. If you must use wax, use non-skid floor wax. Do not have throw rugs and other things on the floor that can make you trip. What can I do with my stairs? Do not leave any items on the stairs. Make sure that there are handrails on both sides of the stairs and use them. Fix handrails that are broken or loose. Make sure that handrails are as long as the stairways. Check any carpeting to make sure that it is firmly attached to the stairs. Fix any carpet that is loose or worn. Avoid having throw rugs at the top or bottom of the stairs. If you do have throw rugs, attach them to the floor with carpet tape. Make sure that you have a light switch at the top of the stairs  and the bottom of the stairs. If you do not have them, ask someone to add them for you. What else can I do to help prevent falls? Wear shoes that: Do not have high heels. Have rubber bottoms. Are comfortable and fit you well. Are closed at the toe. Do not wear sandals. If you use a stepladder: Make sure that it is fully opened. Do not climb a closed stepladder. Make sure that both sides of the stepladder are locked into place. Ask someone to hold it for you, if possible. Clearly mark and make sure that you can see: Any grab bars or handrails. First and last steps. Where the edge of each step is. Use tools that help you move around (mobility aids) if they are needed. These include: Canes. Walkers. Scooters. Crutches. Turn on the lights when you go into a dark area. Replace any light bulbs  as soon as they burn out. Set up your furniture so you have a clear path. Avoid moving your furniture around. If any of your floors are uneven, fix them. If there are any pets around you, be aware of where they are. Review your medicines with your doctor. Some medicines can make you feel dizzy. This can increase your chance of falling. Ask your doctor what other things that you can do to help prevent falls. This information is not intended to replace advice given to you by your health care provider. Make sure you discuss any questions you have with your health care provider. Document Released: 06/22/2009 Document Revised: 02/01/2016 Document Reviewed: 09/30/2014 Elsevier Interactive Patient Education  2017 [employer].

## 2024-01-08 DIAGNOSIS — E113293 Type 2 diabetes mellitus with mild nonproliferative diabetic retinopathy without macular edema, bilateral: Secondary | ICD-10-CM | POA: Diagnosis not present

## 2024-01-08 DIAGNOSIS — H353133 Nonexudative age-related macular degeneration, bilateral, advanced atrophic without subfoveal involvement: Secondary | ICD-10-CM | POA: Diagnosis not present

## 2024-01-08 DIAGNOSIS — H43812 Vitreous degeneration, left eye: Secondary | ICD-10-CM | POA: Diagnosis not present

## 2024-01-08 DIAGNOSIS — H35033 Hypertensive retinopathy, bilateral: Secondary | ICD-10-CM | POA: Diagnosis not present

## 2024-01-08 DIAGNOSIS — H34811 Central retinal vein occlusion, right eye, with macular edema: Secondary | ICD-10-CM | POA: Diagnosis not present

## 2024-01-08 DIAGNOSIS — H35372 Puckering of macula, left eye: Secondary | ICD-10-CM | POA: Diagnosis not present

## 2024-01-15 DIAGNOSIS — S52552A Other extraarticular fracture of lower end of left radius, initial encounter for closed fracture: Secondary | ICD-10-CM | POA: Diagnosis not present

## 2024-01-15 DIAGNOSIS — M25532 Pain in left wrist: Secondary | ICD-10-CM | POA: Diagnosis not present

## 2024-01-16 ENCOUNTER — Encounter: Payer: Self-pay | Admitting: Internal Medicine

## 2024-01-16 ENCOUNTER — Ambulatory Visit: Payer: Medicare Other | Admitting: Internal Medicine

## 2024-01-16 VITALS — BP 124/70 | HR 89 | Ht 70.5 in | Wt 246.4 lb

## 2024-01-16 DIAGNOSIS — E1159 Type 2 diabetes mellitus with other circulatory complications: Secondary | ICD-10-CM

## 2024-01-16 DIAGNOSIS — E538 Deficiency of other specified B group vitamins: Secondary | ICD-10-CM

## 2024-01-16 DIAGNOSIS — E785 Hyperlipidemia, unspecified: Secondary | ICD-10-CM | POA: Diagnosis not present

## 2024-01-16 DIAGNOSIS — E1165 Type 2 diabetes mellitus with hyperglycemia: Secondary | ICD-10-CM | POA: Diagnosis not present

## 2024-01-16 DIAGNOSIS — Z7985 Long-term (current) use of injectable non-insulin antidiabetic drugs: Secondary | ICD-10-CM

## 2024-01-16 DIAGNOSIS — N182 Chronic kidney disease, stage 2 (mild): Secondary | ICD-10-CM | POA: Diagnosis not present

## 2024-01-16 DIAGNOSIS — Z7984 Long term (current) use of oral hypoglycemic drugs: Secondary | ICD-10-CM | POA: Diagnosis not present

## 2024-01-16 DIAGNOSIS — E1122 Type 2 diabetes mellitus with diabetic chronic kidney disease: Secondary | ICD-10-CM | POA: Diagnosis not present

## 2024-01-16 LAB — POCT GLYCOSYLATED HEMOGLOBIN (HGB A1C): Hemoglobin A1C: 7.5 % — AB (ref 4.0–5.6)

## 2024-01-16 MED ORDER — GLIPIZIDE 5 MG PO TABS: 5.0000 mg | ORAL_TABLET | Freq: Every day | ORAL | 3 refills | Status: DC

## 2024-01-16 NOTE — Progress Notes (Signed)
 Patient ID: Charles [name], male   DOB: [birth year], 83 y.o.   MRN: [id number]  HPI: Charles [name] is a 83 y.o.-year-old male, returning for f/u for DM2 dx 2008, uncontrolled, with complications (CAD - s/p NSTEMI, PN, mild CKD). Last visit 4 months ago.  Interim history: No increased urination, blurry vision, nausea, chest pain. He has left shoulder pain (rotator cuff tear).  No steroid inj's, no surgery. He did PT. No signif. Pain now. Since last visit, he had a fall - he was in the emergency room 01/05/2024 >> tiny fracture L hand - in brace.  DM2: Reviewed HbA1c levels: Lab Results  Component Value Date   HGBA1C 8.3 (A) 09/19/2023   HGBA1C 8.1 (A) 05/19/2023   HGBA1C 7.8 (A) 01/14/2023  12/2013: 8.6%  Pt is on a regimen of: - Metformin  1000 mg 2x a day >> 2000 mg with dinner (but sugars worse) >> 1000 mg 2x a day - Glipizide  10 >> 5 mg 2x a day before meals >> 5 mg in a.m. and 5-10 mg before dinner >> 2.5 mg 2x a day before meals >> 1x a day before dinner >> after dinner >> before dinner - >> stopped 09/2023  - Ozempic  0.25 >> 0.5 mg weekly (started 1 mo ago) We tried to start Ozempic  0.25 mg weekly -08/2020 >> too expensive. In the past, I recommended  NPH 14 units at bedtime, but he did not start. We tried Rybelsus  3 >> 6 mg daily-added 12/2018 >> $$$ >> stopped 01/2019 He checked with his insurance and none of the SGLT2 inhibitors are covered for him.  Pt checks his sugars once a day: - am:  92-151, 158 >> 124-179, 190 >> 109, 130-150, 180 - 2h after b'fast: 68x1 >> n/c >> 201, 206 >> n/c  - before lunch:  n/c >> 69, 79 >> n/c >> 90 x1 >> n/c - 2h after lunch: 123-176 >> n/c >> 317 >> n/c - before dinner:90s >> n/c >> 80 >> n/c >> 124 >> n/c - 2h after dinner: n/c >> 240 >> n/c - bedtime: 110-150 >> 160s >> n/c Lowest sugar was  50 >> ... 92 >> 100; he has hypoglycemia awareness in the 80s. Highest sugar was 317  (forgot meds) >> 158 >> 180  He sees nutrition.  Per their note  from 08/11/2023: Current meal patterns:  - Breakfast: can vary, can be toast or bowl of cereal, eggs 1-2x/week - Lunch usually go out with a friend, country food  - Supper at home, varies - Snacks frequently, halloween candy right now, chips - Drinks coffee with sweetener  -+ CKD, last BUN/creatinine:  Lab Results  Component Value Date   BUN 15 10/22/2023   CREATININE 1.00 10/22/2023   Lab Results  Component Value Date   MICRALBCREAT 783 (H) 10/02/2023   MICRALBCREAT 707 (H) 09/19/2023   MICRALBCREAT 41.8 (H) 05/19/2023   MICRALBCREAT 48.1 (H) 07/16/2022   MICRALBCREAT 9.7 08/08/2016   MICRALBCREAT 10.6 07/21/2015   MICRALBCREAT 25.8 04/06/2014   MICRALBCREAT 12.8 06/29/2013   MICRALBCREAT 5.3 03/15/2013   MICRALBCREAT 2.8 02/13/2012  Of note, he has a history of nephrectomy. On lisinopril  5 mg daily - started 09/2023.  -+ HL; last set of lipids: Lab Results  Component Value Date   CHOL 139 10/22/2023   HDL 44.70 10/22/2023   LDLCALC 57 10/22/2023   LDLDIRECT 139.8 07/08/2014   TRIG 183.0 (H) 10/22/2023   CHOLHDL 3 10/22/2023  On Lipitor 40.  -  last eye exam was 01/22/2023: No DR; now seeing Dr. Burundi and Dr. Lenon Radar- retinal vein occlusion, + cataracts, + macular "hole".  He has a history of eye surgeries.  He gets intraocular injections  - his numbness and tingling in feet improved after starting B12. Foot exam 10/22/2023.  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  B12 deficiency: -Diagnosed during investigation for numbness and tingling (peripheral neuropathy) -He was initially on B12 injections, then on 5000 mcg daily -He was on 5000 mcg every 3 days, changed by PCP 09/2019 -We changed to 5000 mcg weekly 09/2022  Reviewed his B12 levels: Lab Results  Component Value Date   VITAMINB12 934 (H) 05/19/2023   VITAMINB12 >1500  (H) 09/13/2022   VITAMINB12 832 10/09/2021   VITAMINB12 900 10/06/2020   VITAMINB12 748 12/22/2019   VITAMINB12 1,033 (H) 10/08/2019   VITAMINB12 1,172 (H) 08/19/2018   VITAMINB12 >1500 (H) 05/04/2018   VITAMINB12 1,249 (H) 02/18/2017   VITAMINB12 234 10/22/2016   He was admitted with NSTEMI 10/18/2015.  He had stents placed then.  He was going to the gym 5 out of 7 days, but stopped during the coronavirus pandemic.  He restarted to go to the gym afterwards.  ROS: + See HPI  I reviewed pt's medications, allergies, PMH, social hx, family hx, and changes were documented in the history of present illness. Otherwise, unchanged from my initial visit note.  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   Coronary artery disease    Dysrhythmia    PAF   Eczema    GERD (gastroesophageal reflux disease)    Hernia, ventral    History of blood transfusion    Hyperlipidemia    Hypertension    Myocardial infarction (HCC)    Neuropathy    Obesity    Personal history of adenomatous colonic polyps 07/20/2012   3 + adenomas 2009 07/20/2012 2 diminutive polyps     Pneumonia 1942; 1950s X 1   Type II diabetes mellitus (HCC)    type II   Walking pneumonia 2000's X 1   Past Surgical History:  Procedure Laterality Date   A-FLUTTER ABLATION N/A 07/12/2022   Procedure: A-FLUTTER ABLATION;  Surgeon: Efraim Grange, MD;  Location: [facility] INVASIVE CV LAB;  Service: Cardiovascular;  Laterality: N/A;   ATRIAL FIBRILLATION ABLATION N/A 07/12/2022   Procedure: ATRIAL FIBRILLATION ABLATION;  Surgeon: Efraim Grange, MD;  Location: [facility] INVASIVE CV LAB;  Service: Cardiovascular;  Laterality: N/A;   CARDIAC CATHETERIZATION N/A 10/19/2015   Procedure: Left Heart Cath and Coronary Angiography;  Surgeon: Chapman Commodore, MD;   [facility] INVAS[facility] CV [facility];  Service: Cardiovascular;  Laterality: N/A;   CARDIAC CATHETERIZATION N/A 10/19/2015   Procedure: Coronary Stent Intervention;  Surgeon: Chapman Commodore, MD;  Location: [facility] INVASIVE CV LAB;  Service: Cardiovascular;  Laterality: N/A;   CATARACT EXTRACTION, BILATERAL Bilateral 01/2018   Dr. Matthew Songster did surgery and he developed MD and is seeing specialist now for MD treatment   COLONOSCOPY     "nothing showed up this time"   COLONOSCOPY W/ BIOPSIES AND POLYPECTOMY  X  1   CORONARY ANGIOPLASTY     EXCISION MASS NECK Left 01/08/2021   Procedure: EXCISION MASS NECK;  Surgeon: Virgina Grills, MD;  Location: [facility] OR;  Service: ENT;  Laterality: Left;   FEMUR IM NAIL Right 07/26/2013   Procedure: INTRAMEDULLARY (IM) NAIL FEMORAL subtrochanteric;  Surgeon: Bevin Bucks, MD;  Location: [facility] OR;  Service: Orthopedics;  Laterality: Right;   FRACTURE SURGERY     INGUINAL HERNIA REPAIR Left >3 times   INGUINAL HERNIA REPAIR Right 2012   NEPHRECTOMY Left 2000   TONSILLECTOMY  1940s   Social History   Socioeconomic History   Marital status: Widowed    Spouse name: Not on file   Number of children: 3   Years of education: Not on file   Highest education level: Master's degree (e.g., MA, MS, MEng, MEd, MSW, MBA)  Occupational History   Occupation: retired  Tobacco Use   Smoking status: Former    Current packs/day: 0.00    Types: Cigarettes, Pipe, Cigars    Start date: 09/09/1948    Quit date: 09/09/1968    Years since quitting: 55.3   Smokeless tobacco: Never   Tobacco comments:    smoking cigars- quit in 2006  , quit pipe 2000  Vaping Use   Vaping status: Never Used  Substance and Sexual Activity   Alcohol use: Not Currently    Alcohol/week: 2.0 standard drinks of alcohol    Types: 2 Glasses of wine per week    Comment: 2 glasses on Tuesday   Drug use: No   Sexual activity: Never  Other Topics Concern   Not on file  Social History Narrative   Regular exercise:  goes to the [facility] and walks   Caffeine use: daily; coffee   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     widowed    Three children, One in [place], one in [place] ,and one in [place]    HH - 2 : youngest daughter lives with him   Social Drivers of Corporate investment banker Strain: Low Risk  (01/07/2024)   Overall Financial Resource Strain (CARDIA)    Difficulty of Paying Living Expenses: Not hard at all  Food Insecurity: No Food Insecurity (01/07/2024)   Hunger Vital Sign    Worried About Running Out of Food in the Last Year: Never true    Ran Out of Food in the Last Year: Never true  Transportation Needs: No Transportation Needs (01/07/2024)   PRAPARE - Administrator, Civil Service (Medical): No    Lack of Transportation (Non-Medical): No  Physical Activity: Sufficiently Active (01/07/2024)   Exercise Vital Sign    Days of Exercise per Week: 5 days    Minutes of Exercise per Session: 40 min  Stress: No Stress Concern Present (01/07/2024)   [employer] of Occupational Health - Occupational Stress Questionnaire    Feeling of Stress : Not at all  Social Connections: Moderately Isolated (01/07/2024)   Social Connection and Isolation Panel [NHANES]    Frequency of Communication with Friends and Family: More than three times a week    Frequency of Social Gatherings with Friends and Family: More than three times a week    Attends Religious Services: More than 4 times per year    Active Member of [employer] or Organizations: No    Attends Banker Meetings: Never    Marital Status: Widowed  Intimate Partner Violence: Not At Risk (01/07/2024)   Humiliation, Afraid, Rape,  and Kick questionnaire    Fear of Current or Ex-Partner: No    Emotionally Abused: No    Physically Abused: No    Sexually Abused: No   Current Outpatient Medications on File Prior to Visit  Medication Sig Dispense Refill   atorvastatin  (LIPITOR) 40 MG tablet Take 1 tablet (40 mg total)  by mouth daily at 6 PM. (Patient taking differently: Take 40 mg by mouth daily.) 30 tablet 3   Cholecalciferol  (VITAMIN D -3) 125 MCG (5000 UT) TABS Take 5,000 Units by mouth in the morning.     Cyanocobalamin  (VITAMIN B-12) 5000 MCG SUBL Place 5,000 mcg under the tongue every 3 (three) days.     ELIQUIS 5 MG TABS tablet Take 5 mg by mouth 2 (two) times daily.     EPINEPHrine  0.3 mg/0.3 mL IJ SOAJ injection Inject 0.3 mg into the muscle as needed for anaphylaxis.     glipiZIDE  (GLUCOTROL ) 5 MG tablet Take 1 tablet (5 mg total) by mouth daily before supper.     glucose blood (ONETOUCH VERIO) test strip USE 1 STRIP TO CHECK GLUCOSE ONCE DAILY 100 each 3   lisinopril  (ZESTRIL ) 5 MG tablet TAKE 1 TABLET BY MOUTH DAILY 90 tablet 3   metFORMIN  (GLUCOPHAGE ) 1000 MG tablet Take 1 tablet (1,000 mg total) by mouth 2 (two) times daily with a meal. 200 tablet 3   metoprolol  succinate (TOPROL -XL) 100 MG 24 hr tablet Take 100 mg by mouth in the morning and at bedtime.     Multiple Vitamins-Minerals (PRESERVISION AREDS 2 PO) Take 1 tablet by mouth in the morning and at bedtime.     nitroGLYCERIN  (NITROSTAT ) 0.4 MG SL tablet Place 1 tablet (0.4 mg total) under the tongue every 5 (five) minutes as needed for chest pain. 25 tablet 1   ONETOUCH DELICA LANCETS FINE MISC Use to check sugar daily 100 each 5   Semaglutide ,0.25 or 0.5MG /DOS, 2 MG/3ML SOPN Inject 0.5 mg weekly under skin     tamsulosin  (FLOMAX ) 0.4 MG CAPS capsule TAKE 1 CAPSULE BY MOUTH DAILY 100 capsule 2   No current facility-administered medications on file prior to visit.   Allergies  Allergen Reactions   Bee Venom Anaphylaxis   Family History  Problem Relation Age of Onset   Drug abuse Other    Cancer Other    Heart disease Other    Lung disease Other    Diabetes Mother    Diabetes Sister    Colon cancer Neg Hx    Stomach cancer Neg Hx    PE: There were no vitals taken for this visit. Wt Readings from Last 3 Encounters:  10/22/23 251  lb (113.9 kg)  09/19/23 250 lb 12.8 oz (113.8 kg)  09/15/23 250 lb (113.4 kg)   Constitutional: overweight, in NAD Eyes: EOMI, no exophthalmos ENT: no thyromegaly, no cervical lymphadenopathy Cardiovascular: RRR, No MRG Respiratory: CTA B Musculoskeletal: no deformities Skin:  no rashes Neurological: no tremor with outstretched hands  ASSESSMENT: 1. DM2, not insulin -dependent, uncontrolled, with complications - CAD - s/p NSTEMI (Dr. Glena Landau) - peripheral neuropathy - CKD   He does not have a family history of medullary thyroid  cancer or personal history of pancreatitis.   2. B12 def  3.  Peripheral neuropathy  4. HL  5.  History of elevated TSH -In the setting of amiodarone  treatment  PLAN:  1. Patient with longstanding, uncontrolled, type 2 diabetes, previously with significant improvement in control after adding Ozempic , but then off the medication  due to price.  At last visit he was only on metformin  and sulfonylurea and sugars were elevated, above target.  I advised him to stop the glipizide  ER and tried again to start Ozempic .  His dinners are usually late.  I recommended to continue the glipizide  IR before dinner.  I strongly advised him not to take it after dinner to avoid hypoglycemia overnight.  HbA1c at last visit was higher, at 8.3%. - at today's visit, sugars are better, but he only checks in the morning.  I again advised him to check later in the day.  He tells me that he ran out of the IR glipizide  and he is taking ER glipizide  before dinner.  I advised him to crush the tablet, but I also sent a prescription for the higher glipizide  to his pharmacy.  I would like to stop it completely, but we need more data especially in the second half of the day to be able to decide.  For now, we will continue metformin  and Ozempic , which she tolerates well.  He only started at the 0.5 mg dose approximately a month ago, previously using lower doses. - I advised him to: Patient  Instructions  Please continue: - Metformin  1000 mg 2x a day with meals - Ozempic  0.5 mg weekly in a.m.   Please change to: - Glipizide  IR 5 mg before dinner.  Please check some sugars later in the day, also.   Please return in 4 months with your sugar log.   - we checked his HbA1c: 7.5% (lower) - advised to check sugars at different times of the day - 1x a day, rotating check times - advised for yearly eye exams >> he is UTD - At last visit, he had elevated ACR.  This was confirmed on a subsequent check.  I suggested a referral to nephrology.  He did not have this yet, but his PCP added back lisinopril  low-dose.  Will recheck his ACR at today's visit. - return to clinic in 4 months  2.  And 3. Peripheral neuropathy and B12 deficiency -He has peripheral neuropathy probably related to both diabetes and B12 deficiency -We started B12 supplementation, which we were able to then reduce to 5000 mcg weekly - At last check, the B12 level was only slightly above target, so we continued the same regimen  4. HL - Reviewed latest lipid panel from 10/2023: Triglycerides elevated, LDL only slightly higher than our goal of less than 55: Lab Results  Component Value Date   CHOL 139 10/22/2023   HDL 44.70 10/22/2023   LDLCALC 57 10/22/2023   LDLDIRECT 139.8 07/08/2014   TRIG 183.0 (H) 10/22/2023   CHOLHDL 3 10/22/2023  -He continues Lipitor 40 mg daily without side effects  5.  High TSH -He had an elevated TSH, 9.93, in the past while on amiodarone , but he stopped amiodarone  since - Latest TSH was normal: Lab Results  Component Value Date   TSH 2.78 10/22/2023  - No signs or symptoms of hypothyroidism - Will continue to manage him expectantly  Orders Placed This Encounter  Procedures   Microalbumin / creatinine urine ratio   Emilie Harden, MD PhD [facility] Endocrinology

## 2024-01-16 NOTE — Addendum Note (Signed)
 Addended by: Vernon Goodpasture on: 01/16/2024 10:54 AM   Modules accepted: Orders

## 2024-01-16 NOTE — Patient Instructions (Addendum)
 Please continue: - Metformin  1000 mg 2x a day with meals - Ozempic  0.5 mg weekly in a.m.   Please change to: - Glipizide  IR 5 mg before dinner.  Please check some sugars later in the day, also.   Please return in 4 months with your sugar log.

## 2024-01-17 LAB — MICROALBUMIN / CREATININE URINE RATIO
Creatinine, Urine: 111 mg/dL (ref 20–320)
Microalb Creat Ratio: 282 mg/g{creat} — ABNORMAL HIGH (ref <30)
Microalb, Ur: 31.3 mg/dL

## 2024-01-19 ENCOUNTER — Encounter: Payer: Self-pay | Admitting: Cardiology

## 2024-01-19 ENCOUNTER — Encounter: Payer: Self-pay | Admitting: Internal Medicine

## 2024-01-19 ENCOUNTER — Ambulatory Visit: Payer: Medicare Other | Attending: Cardiology | Admitting: Cardiology

## 2024-01-19 VITALS — BP 113/76 | Resp 16 | Ht 70.0 in | Wt 244.5 lb

## 2024-01-19 DIAGNOSIS — E7800 Pure hypercholesterolemia, unspecified: Secondary | ICD-10-CM

## 2024-01-19 DIAGNOSIS — I10 Essential (primary) hypertension: Secondary | ICD-10-CM | POA: Diagnosis not present

## 2024-01-19 DIAGNOSIS — I2510 Atherosclerotic heart disease of native coronary artery without angina pectoris: Secondary | ICD-10-CM | POA: Diagnosis not present

## 2024-01-19 DIAGNOSIS — I480 Paroxysmal atrial fibrillation: Secondary | ICD-10-CM

## 2024-01-19 NOTE — Progress Notes (Signed)
 Cardiology Office Note:  .   Date:  01/19/2024  ID:  Charles [name], DOB June 16, 1941, MRN [id number] PCP: Alto Atta, NP  [facility] Providers Cardiologist:  Knox Perl, MD Electrophysiologist:  Efraim Grange, MD   History of Present Illness: .   Charles [name] is a 84 y.o. 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  Discussed the use of AI scribe software for clinical note transcription with the patient, who gave verbal consent to proceed.  History of Present Illness Charles [name] "Charles [name]" is an 83 year old male with coronary artery disease and atrial fibrillation who presents for a cardiology follow-up. He was referred by his primary care doctor after his previous cardiologist was no longer covered by his insurance.  He has coronary artery disease with a stent placed in the left anterior descending artery in 2017. He is on atorvastatin  and metoprolol  succinate, with well-managed cholesterol levels, including a total cholesterol of 139 mg/dL and LDL of 57 mg/dL.  He underwent an ablation for atrial fibrillation in November 2023 and maintains sinus rhythm without recent episodes. He is on Eliquis for anticoagulation.  He takes lisinopril  for kidney protection due to diabetes-related kidney disease, with well-controlled blood pressure. He manages type 2 diabetes mellitus with Ozempic , aiding in weight management.  Labs   Lab Results  Component Value Date   CHOL 139 10/22/2023   HDL 44.70 10/22/2023   LDLCALC 57 10/22/2023   LDLDIRECT 139.8 07/08/2014   TRIG 183.0 (H) 10/22/2023   CHOLHDL 3 10/22/2023   Lab Results  Component Value Date   NA 140 10/22/2023   K 4.8 10/22/2023   CO2 32 10/22/2023   GLUCOSE 181 (H) 10/22/2023    BUN 15 10/22/2023   CREATININE 1.00 10/22/2023   CALCIUM  9.2 10/22/2023   GFR 70.06 10/22/2023   EGFR 52 (L) 06/28/2022   GFRNONAA >60 01/08/2021      Latest Ref Rng & Units 10/22/2023    9:42 AM 10/11/2022    9:37 AM 06/28/2022   11:05 AM  BMP  Glucose 70 - 99 mg/dL 324  401  027   BUN 6 - 23 mg/dL 15  12  23    Creatinine 0.40 - 1.50 mg/dL 2.53  6.64  4.03   BUN/Creat Ratio 10 - 24   17   Sodium 135 - 145 mEq/L 140  143  141   Potassium 3.5 - 5.1 mEq/L 4.8  4.6  4.9   Chloride 96 - 112 mEq/L 100  103  102   CO2 19 - 32 mEq/L 32  29  25   Calcium  8.4 - 10.5 mg/dL 9.2  9.0  9.1       Latest Ref Rng & Units 10/22/2023    9:42 AM 10/11/2022    9:37 AM 07/16/2022    3:19 PM  CBC  WBC 4.0 - 10.5 K/uL 5.4  5.0  5.0   Hemoglobin 13.0 - 17.0 g/dL 47.4  25.9  56.3   Hematocrit 39.0 - 52.0 % 43.1  41.6  38.6   Platelets 150.0 - 400.0 K/uL 214.0  202.0  199.0    Lab Results  Component Value Date   HGBA1C 7.5 (A) 01/16/2024    Lab Results  Component Value Date   TSH 2.78 10/22/2023  ROS  Review of Systems  Cardiovascular:  Negative for chest pain, dyspnea on exertion and leg swelling.   Physical Exam:   VS:  BP 113/76 (BP Location: Left Arm, Patient Position: Sitting, Cuff Size: Large)   Resp 16   Ht 5\' 10"  (1.778 m)   Wt 244 lb 8 oz (110.9 kg)   SpO2 96%   BMI 35.08 kg/m    Wt Readings from Last 3 Encounters:  01/19/24 244 lb 8 oz (110.9 kg)  01/16/24 246 lb 6.4 oz (111.8 kg)  10/22/23 251 lb (113.9 kg)    Physical Exam Constitutional:      Appearance: He is obese.  Neck:     Vascular: No carotid bruit or JVD.  Cardiovascular:     Rate and Rhythm: Normal rate and regular rhythm.     Pulses: Intact distal pulses.     Heart sounds: Normal heart sounds. No murmur heard.    No gallop.  Pulmonary:     Effort: Pulmonary effort is normal.     Breath sounds: Normal breath sounds.  Abdominal:     General: Bowel sounds are normal.     Palpations: Abdomen is soft.   Musculoskeletal:     Right lower leg: No edema.     Left lower leg: No edema.    Studies Reviewed: Aaron Aas    CARDIAC CATHETERIZATION 10/19/2015  3.5 x 15 mm Xience Alpine DES    EKG:    EKG Interpretation Date/Time:  Monday Jan 19 2024 15:28:30 EDT Ventricular Rate:  92 PR Interval:  196 QRS Duration:  142 QT Interval:  392 QTC Calculation: 484 R Axis:   -79  Text Interpretation: EKG 01/19/2024: Normal sinus rhythm with rate of 92 bpm, left anterior fascicular block, right bundle branch block.  Bifascicular block.  Compared to 09/15/2023, no significant change. Confirmed by Antonela Freiman, Jagadeesh ([id number]) on 01/19/2024 3:43:09 PM    Medications and allergies    Allergies  Allergen Reactions   Bee Venom Anaphylaxis     Current Outpatient Medications:    atorvastatin  (LIPITOR) 40 MG tablet, Take 1 tablet (40 mg total) by mouth daily at 6 PM. (Patient taking differently: Take 40 mg by mouth daily.), Disp: 30 tablet, Rfl: 3   Cholecalciferol  (VITAMIN D -3) 125 MCG (5000 UT) TABS, Take 5,000 Units by mouth in the morning., Disp: , Rfl:    Cyanocobalamin  (VITAMIN B-12) 5000 MCG SUBL, Place 5,000 mcg under the tongue every 3 (three) days., Disp: , Rfl:    ELIQUIS 5 MG TABS tablet, Take 5 mg by mouth 2 (two) times daily., Disp: , Rfl:    EPINEPHrine  0.3 mg/0.3 mL IJ SOAJ injection, Inject 0.3 mg into the muscle as needed for anaphylaxis., Disp: , Rfl:    glipiZIDE  (GLUCOTROL ) 5 MG tablet, Take 1 tablet (5 mg total) by mouth daily before supper., Disp: 90 tablet, Rfl: 3   glucose blood (ONETOUCH VERIO) test strip, USE 1 STRIP TO CHECK GLUCOSE ONCE DAILY, Disp: 100 each, Rfl: 3   lisinopril  (ZESTRIL ) 5 MG tablet, TAKE 1 TABLET BY MOUTH DAILY, Disp: 90 tablet, Rfl: 3   metFORMIN  (GLUCOPHAGE ) 1000 MG tablet, Take 1 tablet (1,000 mg total) by mouth 2 (two) times daily with a meal., Disp: 200 tablet, Rfl: 3   metoprolol  succinate (TOPROL -XL) 100 MG 24 hr tablet, Take 100 mg by mouth in the morning and at  bedtime., Disp: , Rfl:    Multiple Vitamins-Minerals (PRESERVISION AREDS 2 PO), Take 1 tablet by mouth in  the morning and at bedtime., Disp: , Rfl:    nitroGLYCERIN  (NITROSTAT ) 0.4 MG SL tablet, Place 1 tablet (0.4 mg total) under the tongue every 5 (five) minutes as needed for chest pain., Disp: 25 tablet, Rfl: 1   ONETOUCH DELICA LANCETS FINE MISC, Use to check sugar daily, Disp: 100 each, Rfl: 5   Semaglutide ,0.25 or 0.5MG /DOS, 2 MG/3ML SOPN, Inject 0.5 mg weekly under skin, Disp: , Rfl:    tamsulosin  (FLOMAX ) 0.4 MG CAPS capsule, TAKE 1 CAPSULE BY MOUTH DAILY, Disp: 100 capsule, Rfl: 2   No orders of the defined types were placed in this encounter.    There are no discontinued medications.   ASSESSMENT AND PLAN: .      ICD-10-CM   1. Paroxysmal atrial fibrillation (HCC)  I48.0 EKG 12-Lead    2. Coronary artery disease involving native coronary artery of native heart without angina pectoris  I25.10     3. Primary hypertension  I10     4. Pure hypercholesterolemia  E78.00      Assessment & Plan Coronary artery disease with stent in LAD   Coronary artery disease with a 3.5 x 15 mm stent placed in the LAD in 2017. Minor blockages in other vessels are well-managed with current medications. Continue atorvastatin  40 mg once daily and metoprolol  succinate 100 mg in the morning and 50 mg at night.  Atrial fibrillation, post-ablation   Atrial fibrillation status post-ablation in November 2023. He is maintaining sinus rhythm as confirmed by EKG. Continue current management of atrial fibrillation.  Hyperlipidemia   Hyperlipidemia is well-controlled with current medication. Total cholesterol is 139 mg/dL and LDL is 57 mg/dL, below the target of less than 70 mg/dL. Triglycerides are elevated at 183 mg/dL, indicating dietary factors may be contributing. Continue atorvastatin  40 mg once daily. Advise dietary modifications to reduce triglycerides, including reducing intake of high-calorie and  sugary foods.  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  He has single kidney with history of left nephrectomy for renal cell carcinoma in 2000.  Hemoglobin A1c is 7.5%. Kidney function is stable with an eGFR of 52 mL/min/1.73 m. Current medications include Ozempic , which has helped curb appetite. Discussed potential changes to diabetes medication to improve cardiac and renal outcomes. Consider discontinuing glipizide  and starting Jardiance 10 mg once daily or Farxiga, pending PCP decision. Encourage weight loss to improve overall health and reduce fall risk.  Left kidney cancer, post-nephrectomy   Left kidney cancer with nephrectomy in 2000. The cancer was fully encapsulated with no spread. Current kidney function is stable despite having one kidney. Continue monitoring kidney function. I will see him back in 6 months for follow-up of CAD, if he remains stable I could see him back on a as needed basis with continuation of his present medical therapy unless cardiac issues were to arise.  Signed,  Knox Perl, MD, [facility] 01/19/2024, 4:02 PM [facility] [address] [place], [place] [address] Phone: [phone]. Fax:  [phone]

## 2024-01-19 NOTE — Patient Instructions (Signed)

## 2024-01-26 DIAGNOSIS — H40013 Open angle with borderline findings, low risk, bilateral: Secondary | ICD-10-CM | POA: Diagnosis not present

## 2024-02-12 DIAGNOSIS — M25532 Pain in left wrist: Secondary | ICD-10-CM | POA: Diagnosis not present

## 2024-02-18 ENCOUNTER — Other Ambulatory Visit (INDEPENDENT_AMBULATORY_CARE_PROVIDER_SITE_OTHER)

## 2024-02-18 ENCOUNTER — Encounter: Payer: Self-pay | Admitting: Podiatry

## 2024-02-18 ENCOUNTER — Ambulatory Visit: Admitting: Podiatry

## 2024-02-18 VITALS — Ht 70.0 in | Wt 244.5 lb

## 2024-02-18 DIAGNOSIS — E119 Type 2 diabetes mellitus without complications: Secondary | ICD-10-CM

## 2024-02-18 DIAGNOSIS — L600 Ingrowing nail: Secondary | ICD-10-CM | POA: Diagnosis not present

## 2024-02-18 DIAGNOSIS — Z7984 Long term (current) use of oral hypoglycemic drugs: Secondary | ICD-10-CM

## 2024-02-18 NOTE — Progress Notes (Signed)
 Chief Complaint  Patient presents with   Ingrown Toenail    Pt is here due to ingron on right great toenail.    Subjective: Patient presents today for evaluation of pain to the lateral border right great toe. Patient is concerned for possible ingrown nail.  It is very sensitive to touch.  Patient presents today for further treatment and evaluation.  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   Coronary artery disease    Dysrhythmia    PAF   Eczema    GERD (gastroesophageal reflux disease)    Hernia, ventral    History of blood transfusion    Hyperlipidemia    Hypertension    Myocardial infarction (HCC)    Neuropathy    Obesity    Personal history of adenomatous colonic polyps 07/20/2012   3 + adenomas 2009 07/20/2012 2 diminutive polyps     Pneumonia 1942; 1950s X 1   Type II diabetes mellitus (HCC)    type II   Walking pneumonia 2000's X 1    Past Surgical History:  Procedure Laterality Date   A-FLUTTER ABLATION N/A 07/12/2022   Procedure: A-FLUTTER ABLATION;  Surgeon: Efraim Grange, MD;  Location: [facility] INVASIVE CV LAB;  Service: Cardiovascular;  Laterality: N/A;   ATRIAL FIBRILLATION ABLATION N/A 07/12/2022   Procedure: ATRIAL FIBRILLATION ABLATION;  Surgeon: Efraim Grange, MD;  Location: [facility] INVASIVE CV LAB;  Service: Cardiovascular;  Laterality: N/A;   CARDIAC CATHETERIZATION N/A 10/19/2015   Procedure: Left Heart Cath and Coronary Angiography;  Surgeon: Chapman Commodore, MD;  [facility] INV[facility] CV LAB;  Service: Cardiovascular;  Laterality: N/A;   CARDIAC CATHETERIZATION N/A 10/19/2015   Procedure: Coronary Stent Intervention;  Surgeon: Chapman Commodore, MD;  Location: [facility] INVASIVE CV LAB;  Service: Cardiovascular;  Laterality: N/A;   CATARACT EXTRACTION, BILATERAL Bilateral 01/2018    Dr. Matthew Songster did surgery and he developed MD and is seeing specialist now for MD treatment   COLONOSCOPY     nothing showed up this time   COLONOSCOPY W/ BIOPSIES AND POLYPECTOMY  X 1   CORONARY ANGIOPLASTY     EXCISION MASS NECK Left 01/08/2021   Procedure: EXCISION MASS NECK;  Surgeon: Virgina Grills, MD;  Location: [facility];  Service: ENT;  Laterality: Left;   FEMUR IM NAIL Right 07/26/2013   Procedure: INTRAMEDULLARY (IM) NAIL FEMORAL subtrochanteric;  Surgeon: Bevin Bucks, MD;  Location: [facility] OR;  Service: Orthopedics;  Laterality: Right;   FRACTURE SURGERY     INGUINAL HERNIA REPAIR Left >3 times   INGUINAL HERNIA REPAIR Right 2012   NEPHRECTOMY Left 2000   TONSILLECTOMY  1940s    Allergies  Allergen Reactions   Bee Venom Anaphylaxis    Objective:  General: Well developed, nourished, in no acute distress, alert and oriented x3   Dermatology: Skin is warm, dry and supple bilateral.  Lateral border right great toe is tender with evidence of an ingrowing nail. Pain on palpation noted to the border of the nail fold. The remaining nails appear unremarkable at this time.   Vascular: DP and PT pulses palpable.  No clinical evidence of vascular compromise  Neruologic: Grossly intact via light touch bilateral.  Musculoskeletal: No pedal deformity noted  Assesement: #1 Paronychia with ingrowing nail lateral border right great toe  Plan of Care:  -Patient evaluated.  -Discussed treatment alternatives and plan of care. Explained nail avulsion procedure and post procedure course to patient. -Patient opted for permanent partial nail avulsion of the ingrown portion of the nail.  -Prior to procedure, local anesthesia infiltration utilized using 3 ml of a 50:50 mixture of 2% plain lidocaine  and 0.5% plain marcaine in a normal hallux block fashion and a betadine prep performed.  -Partial permanent nail avulsion with chemical matrixectomy performed using 3x30sec applications of phenol  followed by alcohol flush.  -Light dressing applied.  Post care instructions provided -Return to clinic 3 weeks  Dot Gazella, DPM [facility]  Dr. Dot Gazella, DPM    [address] [place], [place] [address]                Office [phone]  Fax [phone]

## 2024-02-18 NOTE — Progress Notes (Signed)
 02/18/2024 Name: Charles [name] MRN: [id number] DOB: 08-04-1941  Chief Complaint  Patient presents with   Medication Management   Diabetes    Charles [name] is a 83 y.o. year old male who presented for a telephone visit.   They were referred to the pharmacist by their PCP for assistance in managing diabetes, hypertension, and complex medication management.    Subjective:  Care Team: Primary Care Provider: Alto Atta, NP  Medication Access/Adherence  Current Pharmacy:  [facility]) - [place], [place] - [address] [facility] [address] [place] [address] [place] [address] Phone: [phone] Fax: [phone]  [facility] [address] [place]), [place] - [address] [place] [address] Phone: [phone] Fax: [phone]  [facility] - [place], [place] - [address] [place] [address] Phone: [phone] Fax: [phone]   Patient reports affordability concerns with their medications: No  Patient reports access/transportation concerns to their pharmacy: No  Patient reports adherence concerns with their medications:  No  Diabetes:  Current medications: Glipizide  5mg  1 tab at dinner, Ozempic  0.5mg , Metformin  1000mg  BID Medications tried in the past: None  Current glucose readings: 102 this morning, ranging from 84-130 Using OneTouch Verio meter; testing 1 time daily - fasting   Patient denies hypoglycemic s/sx including dizziness, shakiness, sweating. Patient denies hyperglycemic symptoms including polyuria, polydipsia, polyphagia, nocturia, neuropathy, blurred vision.  Current meal patterns:  - Breakfast: can vary, can be toast or bowl of cereal, eggs 1-2x/week - Lunch usually go out with a friend, country food  - Supper at home, varies - Snacks frequently, candy and chips - Drinks coffee with sweetener  Current physical activity: goes  to the [employer]  Current medication access support: None  Hypertension/Afib:  Current medications: Metoprolol  XL 100mg  BID, Eliquis 5mg  BID, Lisinopril  5mg  Medications previously tried: Spironolactone   Patient does not have a validated, automated, upper arm home BP cuff, [facility] Current blood pressure readings readings: 110s-120/60s  Patient denies hypotensive s/sx including dizziness, lightheadedness.  Patient denies hypertensive symptoms including headache, chest pain, shortness of breath  Current meal patterns: see above  Current physical activity: see above   Objective:  Lab Results  Component Value Date   HGBA1C 7.5 (A) 01/16/2024    Lab Results  Component Value Date   CREATININE 1.00 10/22/2023   BUN 15 10/22/2023   NA 140 10/22/2023   K 4.8 10/22/2023   CL 100 10/22/2023   CO2 32 10/22/2023    Lab Results  Component Value Date   CHOL 139 10/22/2023   HDL 44.70 10/22/2023   LDLCALC 57 10/22/2023   LDLDIRECT 139.8 07/08/2014   TRIG 183.0 (H) 10/22/2023   CHOLHDL 3 10/22/2023    Medications Reviewed Today     Reviewed by Charles Christian, RPH (Pharmacist) on 02/18/24 at [phone]  Med List Status: <None>   Medication Order Taking? Sig Documenting Provider Last Dose Status Informant  atorvastatin  (LIPITOR) 40 MG tablet [id number] Yes Take 1 tablet (40 mg total) by mouth daily at 6 PM.  Patient taking differently: Take 40 mg by mouth daily.   Pasqual Bone, MD Taking Active Self  Cholecalciferol  (VITAMIN D -3) 125 MCG (5000 UT) TABS [id number] Yes Take 5,000 Units by mouth in the morning. [provider] Taking Active Self  Cyanocobalamin  (VITAMIN B-12) 5000 MCG SUBL [id number] Yes Place 5,000 mcg under the  tongue every 3 (three) days. [provider] Taking Active Self           Med Note Joaquin Mulberry Jul 07, 2023  9:31 AM) Once a week  ELIQUIS 5 MG TABS tablet [id number] Yes Take 5 mg by mouth 2 (two) times daily. [provider] Taking Active Self  EPINEPHrine  0.3 mg/0.3 mL IJ SOAJ injection [id number]  Inject 0.3 mg into the muscle as needed for anaphylaxis. [provider]  Active Self           Med Note [place] Jest   Fri Jul 12, 2022  6:06 AM) Used over 10 years ago.  glipiZIDE  (GLUCOTROL ) 5 MG tablet [id number] Yes Take 1 tablet (5 mg total) by mouth daily before supper. Emilie Harden, MD Taking Active   glucose blood [facility] VERIO) test strip [id number]  USE 1 STRIP TO CHECK GLUCOSE ONCE DAILY Emilie Harden, MD  Active   lisinopril  (ZESTRIL ) 5 MG tablet [id number] Yes TAKE 1 TABLET BY MOUTH DAILY Nafziger, Randel Buss, NP Taking Active   metFORMIN  (GLUCOPHAGE ) 1000 MG tablet [id number] Yes Take 1 tablet (1,000 mg total) by mouth 2 (two) times daily with a meal. Emilie Harden, MD Taking Active   metoprolol  succinate (TOPROL -XL) 100 MG 24 hr tablet [id number] Yes Take 100 mg by mouth as directed. 1 tab in morning and 1/2 at night [provider] Taking Active Self           Med Note Raquel Cables, Bevelyn Bryant   Wed Dec 27, 2020  8:38 AM)    Multiple Vitamins-Minerals (PRESERVISION AREDS 2 PO) [id number] Yes Take 1 tablet by mouth in the morning and at bedtime. [provider] Taking Active Self  nitroGLYCERIN  (NITROSTAT ) 0.4 MG SL tablet [id number] Yes Place 1 tablet (0.4 mg total) under the tongue every 5 (five) minutes as needed for chest pain. Pasqual Bone, MD Taking Active Self           Med Note [place] [facility]   Fri Jul 12, 2022  6:07 AM) Never used as of 07/12/22.  ONETOUCH DELICA LANCETS FINE MISC [id number]  Use to check sugar daily Emilie Harden, MD  Active Self  Semaglutide ,0.25 or 0.5MG /DOS, 2 MG/3ML Charisse Conception [id number] Yes Inject 0.5 mg weekly under skin Emilie Harden, MD Taking Active            Med Note Charles Christian   Wed Feb 18, 2024  9:27 AM)    tamsulosin  (FLOMAX ) 0.4 MG CAPS capsule [id number] Yes TAKE 1 CAPSULE BY MOUTH DAILY Nafziger, Randel Buss, NP Taking  Active               Assessment/Plan:   Diabetes: - Currently uncontrolled but improving - Reviewed long term cardiovascular and renal outcomes of uncontrolled blood sugar - Reviewed goal A1c, goal fasting, and goal 2 hour post prandial glucose - Reviewed dietary modifications including low carb diets and eating regular meals - Reviewed [facility] modifications including[facility] -Continue current medication therapy. Counseled patient check his sugars at bedtime in addition to fasting. Consider addition of farxiga at next f/u - Recommend to check glucose once daily at varying times (fasting and post-prandial, keep a log  Hypertension/Afib: - Currently controlled - Reviewed long term cardiovascular and renal outcomes of uncontrolled blood pressure - Reviewed appropriate blood pressure monitoring technique and reviewed goal blood pressure. Recommended to check home blood pressure and heart rate 2-3x/week -Continue current medication therapy  Follow Up Plan: 2 weeks Charles Christian, PharmD Clinical Pharmacist [phone]

## 2024-02-20 ENCOUNTER — Ambulatory Visit (INDEPENDENT_AMBULATORY_CARE_PROVIDER_SITE_OTHER): Admitting: Adult Health

## 2024-02-20 ENCOUNTER — Encounter: Payer: Self-pay | Admitting: Adult Health

## 2024-02-20 VITALS — BP 110/70 | HR 79 | Temp 98.2°F | Ht 70.0 in | Wt 241.0 lb

## 2024-02-20 DIAGNOSIS — H6123 Impacted cerumen, bilateral: Secondary | ICD-10-CM | POA: Diagnosis not present

## 2024-02-20 DIAGNOSIS — S39012A Strain of muscle, fascia and tendon of lower back, initial encounter: Secondary | ICD-10-CM | POA: Diagnosis not present

## 2024-02-20 MED ORDER — CYCLOBENZAPRINE HCL 10 MG PO TABS: 10.0000 mg | ORAL_TABLET | Freq: Three times a day (TID) | ORAL | 0 refills | Status: DC | PRN

## 2024-02-20 NOTE — Progress Notes (Signed)
 Subjective:    Patient ID: Charles [name], male    DOB: 10/24/1940, 83 y.o.   MRN: [id number]  HPI 83 year old male who  has a past medical history of Anemia, Arthritis, AVM (arteriovenous malformation) of colon, Cancer of kidney (HCC) (2000), Coronary artery disease, Dysrhythmia, Eczema, GERD (gastroesophageal reflux disease), Hernia, ventral, History of blood transfusion, Hyperlipidemia, Hypertension, Myocardial infarction (HCC), Neuropathy, Obesity, Personal history of adenomatous colonic polyps (07/20/2012), Pneumonia (1942; 1950s X 1), Type II diabetes mellitus (HCC), and Walking pneumonia (2000's X 1).  He presents to the office today for an acute issue. He reports that he has had  right lower back spasms for the last week that seem to be getting more severe as the week goes on. If he twists or turns the pain radiates across his low back. He denies dysuria, hematuria, fevers, chills, or bowel incontinence. The only thing he can think of that could have caused the pain was resetting a heavy drawer.   At home he has been using Tylenol  and heat with some relief.   He also reports that he has had the feeling of ear fullness and believes he needs his ears cleaned out. He wears hearing aids.    Review of Systems See HPI   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   Coronary artery disease    Dysrhythmia    PAF   Eczema    GERD (gastroesophageal reflux disease)    Hernia, ventral    History of blood transfusion    Hyperlipidemia    Hypertension    Myocardial infarction (HCC)    Neuropathy    Obesity    Personal history of adenomatous colonic polyps 07/20/2012   3 + adenomas 2009 07/20/2012 2 diminutive polyps     Pneumonia 1942; 1950s X 1   Type II diabetes mellitus (HCC)    type II   Walking  pneumonia 2000's X 1    Social History   Socioeconomic History   Marital status: Widowed    Spouse name: Not on file   Number of children: 3   Years of education: Not on file   Highest education level: Master's degree (e.g., MA, MS, MEng, MEd, MSW, MBA)  Occupational History   Occupation: retired  Tobacco Use   Smoking status: Former    Current packs/day: 0.00    Types: Cigarettes, Pipe, Cigars    Start date: 09/09/1948    Quit date: 09/09/1968    Years since quitting: 55.4   Smokeless tobacco: Never   Tobacco comments:    smoking cigars- quit in 2006  , quit pipe 2000  Vaping Use   Vaping status: Never Used  Substance and Sexual Activity   Alcohol use: Not Currently    Alcohol/week: 2.0 standard drinks of alcohol    Types: 2 Glasses of wine per week    Comment: 2 glasses on Tuesday   Drug use: No   Sexual activity: Never  Other Topics Concern   Not on file  Social History Narrative   Regular exercise: goes to the [facility] and walks   Caffeine use: daily; coffee   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     widowed  Three children, One in [place], one in [place] ,and one in [place]    HH - 2 : youngest daughter lives with him   Social Drivers of Corporate investment banker Strain: Low Risk  (01/07/2024)   Overall Financial Resource Strain (CARDIA)    Difficulty of Paying Living Expenses: Not hard at all  Food Insecurity: No Food Insecurity (01/07/2024)   Hunger Vital Sign    Worried About Running Out of Food in the Last Year: Never true    Ran Out of Food in the Last Year: Never true  Transportation Needs: No Transportation Needs (01/07/2024)   PRAPARE - Administrator, Civil Service (Medical): No    Lack of Transportation (Non-Medical): No  Physical Activity: Sufficiently Active (01/07/2024)   Exercise Vital Sign    Days of Exercise per Week: 5 days    Minutes of Exercise per Session: 40 min  Stress: No Stress Concern Present (01/07/2024)   [employer] of Occupational Health - Occupational Stress Questionnaire    Feeling of Stress : Not at all  Social Connections: Moderately Isolated (01/07/2024)   Social Connection and Isolation Panel    Frequency of Communication with Friends and Family: More than three times a week    Frequency of Social Gatherings with Friends and Family: More than three times a week    Attends Religious Services: More than 4 times per year    Active Member of [employer] or Organizations: No    Attends Banker Meetings: Never    Marital Status: Widowed  Intimate Partner Violence: Not At Risk (01/07/2024)   Humiliation, Afraid, Rape, and Kick questionnaire    Fear of Current or Ex-Partner: No    Emotionally Abused: No    Physically Abused: No    Sexually Abused: No    Past Surgical History:  Procedure Laterality Date   A-FLUTTER ABLATION N/A 07/12/2022   Procedure: A-FLUTTER ABLATION;  Surgeon: Mealor, Donnamae Gaba, MD;  Location: [facility];  Service: Cardiovascular;  Laterality: N/A;   ATRIAL FIBRILLATION ABLATION N/A 07/12/2022   Procedure: ATRIAL FIBRILLATION ABLATION;  Surgeon: Efraim Grange, MD;  Location: [facility];  Service: Cardiovascular;  Laterality: N/A;   CARDIAC CATHETERIZATION N/A 10/19/2015   Procedure: Left Heart Cath and Coronary Angiography;  Surgeon: Chapman Commodore, MD;  Location: [facility] INVASIVE CV LAB;  Service: Cardiovascular;  Laterality: N/A;   CARDIAC CATHETERIZATION N/A 10/19/2015   Procedure: Coronary Stent Intervention;  Surgeon: Chapman Commodore, MD;  Location: [facility];  Service: Cardiovascular;  Laterality: N/A;   CATARACT EXTRACTION, BILATERAL Bilateral 01/2018   Dr. Matthew Songster did surgery and he developed MD and is seeing specialist now for MD treatment   COLONOSCOPY     nothing showed up this time   COLONOSCOPY W/ BIOPSIES AND POLYPECTOMY  X 1   CORONARY ANGIOPLASTY     EXCISION MASS NECK Left 01/08/2021   Procedure: EXCISION MASS NECK;   Surgeon: Virgina Grills, MD;  Location: [facility] OR;  Service: ENT;  Laterality: Left;   FEMUR IM NAIL Right 07/26/2013   Procedure: INTRAMEDULLARY (IM) NAIL FEMORAL subtrochanteric;  Surgeon: Bevin Bucks, MD;  Location: [facility] OR;  Service: Orthopedics;  Laterality: Right;   FRACTURE SURGERY     INGUINAL HERNIA REPAIR Left >3 times   INGUINAL HERNIA REPAIR Right 2012   NEPHRECTOMY Left 2000   TONSILLECTOMY  1940s    Family History  Problem Relation Age of Onset   Diabetes Mother  Diabetes Sister    Drug abuse Other    Cancer Other    Heart disease Other    Lung disease Other    Colon cancer Neg Hx    Stomach cancer Neg Hx     Allergies  Allergen Reactions   Bee Venom Anaphylaxis    Current Outpatient Medications on File Prior to Visit  Medication Sig Dispense Refill   atorvastatin  (LIPITOR) 40 MG tablet Take 1 tablet (40 mg total) by mouth daily at 6 PM. (Patient taking differently: Take 40 mg by mouth daily.) 30 tablet 3   Cholecalciferol  (VITAMIN D -3) 125 MCG (5000 UT) TABS Take 5,000 Units by mouth in the morning.     Cyanocobalamin  (VITAMIN B-12) 5000 MCG SUBL Place 5,000 mcg under the tongue every 3 (three) days.     ELIQUIS 5 MG TABS tablet Take 5 mg by mouth 2 (two) times daily.     EPINEPHrine  0.3 mg/0.3 mL IJ SOAJ injection Inject 0.3 mg into the muscle as needed for anaphylaxis.     glipiZIDE  (GLUCOTROL ) 5 MG tablet Take 1 tablet (5 mg total) by mouth daily before supper. 90 tablet 3   glucose blood (ONETOUCH VERIO) test strip USE 1 STRIP TO CHECK GLUCOSE ONCE DAILY 100 each 3   lisinopril  (ZESTRIL ) 5 MG tablet TAKE 1 TABLET BY MOUTH DAILY 90 tablet 3   metFORMIN  (GLUCOPHAGE ) 1000 MG tablet Take 1 tablet (1,000 mg total) by mouth 2 (two) times daily with a meal. 200 tablet 3   metoprolol  succinate (TOPROL -XL) 100 MG 24 hr tablet Take 100 mg by mouth as directed. 1 tab in morning and 1/2 at night     Multiple Vitamins-Minerals (PRESERVISION AREDS 2 PO) Take 1 tablet by  mouth in the morning and at bedtime.     nitroGLYCERIN  (NITROSTAT ) 0.4 MG SL tablet Place 1 tablet (0.4 mg total) under the tongue every 5 (five) minutes as needed for chest pain. 25 tablet 1   ONETOUCH DELICA LANCETS FINE MISC Use to check sugar daily 100 each 5   Semaglutide ,0.25 or 0.5MG /DOS, 2 MG/3ML SOPN Inject 0.5 mg weekly under skin     tamsulosin  (FLOMAX ) 0.4 MG CAPS capsule TAKE 1 CAPSULE BY MOUTH DAILY 100 capsule 2   No current facility-administered medications on file prior to visit.    BP 110/70   Pulse 79   Temp 98.2 F (36.8 C) (Oral)   Ht 5' 10 (1.778 m)   Wt 241 lb (109.3 kg)   SpO2 96%   BMI 34.58 kg/m       Objective:   Physical Exam Vitals and nursing note reviewed.  Constitutional:      Appearance: Normal appearance.  HENT:     Right Ear: There is impacted cerumen.     Left Ear: There is impacted cerumen.   Musculoskeletal:        General: Tenderness present.     Lumbar back: Spasms and tenderness present. No bony tenderness. Decreased range of motion.       Back:   Skin:    General: Skin is warm and dry.   Neurological:     General: No focal deficit present.     Mental Status: He is alert and oriented to person, place, and time.   Psychiatric:        Mood and Affect: Mood normal.        Behavior: Behavior normal.        Thought Content: Thought content normal.  Judgment: Judgment normal.       Assessment & Plan:  1. Strain of lumbar region, initial encounter (Primary) - Seems to be muscular in origin. Will send in Flexeril . Continue to use heat and can take Tylenol   - Follow up if not resolved in the next few days  - cyclobenzaprine  (FLEXERIL ) 10 MG tablet; Take 1 tablet (10 mg total) by mouth 3 (three) times daily as needed for muscle spasms.  Dispense: 30 tablet; Refill: 0  2. Bilateral impacted cerumen Ear Cerumen Removal  Date/Time: 02/20/2024 9:03 AM  Performed by: Alto Atta, NP Authorized by: Alto Atta, NP    Anesthesia: Local Anesthetic: none Location details: right ear and left ear Patient tolerance: patient tolerated the procedure well with no immediate complications Procedure type: curette  Sedation: Patient sedated: no

## 2024-02-26 ENCOUNTER — Other Ambulatory Visit: Payer: Self-pay | Admitting: Internal Medicine

## 2024-03-03 ENCOUNTER — Other Ambulatory Visit (INDEPENDENT_AMBULATORY_CARE_PROVIDER_SITE_OTHER)

## 2024-03-03 DIAGNOSIS — Z7984 Long term (current) use of oral hypoglycemic drugs: Secondary | ICD-10-CM

## 2024-03-03 DIAGNOSIS — E119 Type 2 diabetes mellitus without complications: Secondary | ICD-10-CM

## 2024-03-03 NOTE — Progress Notes (Signed)
 03/03/2024 Name: KAMDIN FOLLETT MRN: [id number] DOB: 14-Nov-1940  Chief Complaint  Patient presents with   Medication Management   Diabetes    Charles [name] is a 83 y.o. year old male who presented for a telephone visit.   They were referred to the pharmacist by their PCP for assistance in managing diabetes, hypertension, and complex medication management.    Subjective:  Care Team: Primary Care Provider: Merna Huxley, NP  Medication Access/Adherence  Current Pharmacy:  [facility] [place] [facility]) - [place], [place] - [address] [facility] [address] [place] [address] [place] [address] Phone: [phone] Fax: [phone]  [facility] [address] [place]), [place] - [address] [place] [address] Phone: [phone] Fax: [phone]  [facility] - [place], [place] - [address] [place] [address] Phone: [phone] Fax: [phone]   Patient reports affordability concerns with their medications: No  Patient reports access/transportation concerns to their pharmacy: No  Patient reports adherence concerns with their medications:  No  Diabetes:  Current medications: Glipizide  5mg  1 tab at dinner, Ozempic  0.5mg , Metformin  1000mg  BID Medications tried in the past: None  Current glucose readings: 135 this morning but ate dinner at almost midnight which is unusual for him. Reports most readings ranging from 100-130 in the mornings. Did start checking before bed and reports readings ranging from 140-145 typically with highest 180  Using OneTouch Verio meter; testing 1 time daily - fasting  Did have a low of 34 last night but had gone 10 hours without eating, was surprised that he did not feel low with that reading.    Patient denies hypoglycemic s/sx including dizziness, shakiness, sweating. Patient denies hyperglycemic symptoms including polyuria, polydipsia,  polyphagia, nocturia, neuropathy, blurred vision.  Current meal patterns:  - Breakfast: can vary, can be toast or bowl of cereal, eggs 1-2x/week - Lunch usually go out with a friend, country food  - Supper at home, varies - Snacks frequently, candy and chips - Drinks coffee with sweetener  Current physical activity: goes to the [employer]  Current medication access support: None  Hypertension/Afib:  Current medications: Metoprolol  XL 100mg  BID, Eliquis 5mg  BID, Lisinopril  5mg  Medications previously tried: Spironolactone   Patient does not have a validated, automated, upper arm home BP cuff[facility] Current blood pressure readings readings: 110s-120/60s  Patient denies hypotensive s/sx including dizziness, lightheadedness.  Patient denies hypertensive symptoms including headache, chest pain, shortness of breath  Current meal patterns: see above  Current physical activity: see above   Objective:  Lab Results  Component Value Date   HGBA1C 7.5 (A) 01/16/2024    Lab Results  Component Value Date   CREATININE 1.00 10/22/2023   BUN 15 10/22/2023   NA 140 10/22/2023   K 4.8 10/22/2023   CL 100 10/22/2023   CO2 32 10/22/2023    Lab Results  Component Value Date   CHOL 139 10/22/2023   HDL 44.70 10/22/2023   LDLCALC 57 10/22/2023   LDLDIRECT 139.8 07/08/2014   TRIG 183.0 (H) 10/22/2023   CHOLHDL 3 10/22/2023    Medications Reviewed Today     Reviewed by Lionell Jon DEL, RPH (Pharmacist) on 03/03/24 at [phone]  Med List Status: <None>   Medication Order Taking? Sig Documenting Provider Last Dose Status Informant  atorvastatin  (LIPITOR) 40 MG tablet [id number] Yes Take 1 tablet (40 mg total) by mouth  daily at 6 PM. Claudene Pacific, MD  Active Self  Cholecalciferol  (VITAMIN D -3) 125 MCG (5000 UT) TABS [id number] Yes Take 5,000 Units by mouth in the morning. [provider]  Active Self  Cyanocobalamin  (VITAMIN B-12) 5000 MCG SUBL [id number] Yes Place 5,000  mcg under the tongue every 3 (three) days. [provider]  Active Self           Med Note JANEAN JON VEAR Pablo Jul 07, 2023  9:31 AM) Once a week  cyclobenzaprine  (FLEXERIL ) 10 MG tablet [id number] Yes Take 1 tablet (10 mg total) by mouth 3 (three) times daily as needed for muscle spasms. Nafziger, Darleene, NP  Active   ELIQUIS 5 MG TABS tablet [id number] Yes Take 5 mg by mouth 2 (two) times daily. [provider]  Active Self  EPINEPHrine  0.3 mg/0.3 mL IJ SOAJ injection [id number]  Inject 0.3 mg into the muscle as needed for anaphylaxis. [provider]  Active Self           Med Note ALDONA DAWNA SAILOR   Fri Jul 12, 2022  6:06 AM) Used over 10 years ago.  glipiZIDE  (GLUCOTROL ) 5 MG tablet [id number] Yes Take 1 tablet (5 mg total) by mouth daily before supper. Trixie File, MD  Active   glucose blood [facility] VERIO) test strip [id number]  USE 1 STRIP TO CHECK GLUCOSE ONCE DAILY Trixie File, MD  Active   lisinopril  (ZESTRIL ) 5 MG tablet [id number] Yes TAKE 1 TABLET BY MOUTH DAILY Nafziger, Darleene, NP  Active   metFORMIN  (GLUCOPHAGE ) 1000 MG tablet [id number] Yes Take 1 tablet (1,000 mg total) by mouth 2 (two) times daily with a meal. Trixie File, MD  Active   metoprolol  succinate (TOPROL -XL) 100 MG 24 hr tablet [id number] Yes Take 100 mg by mouth as directed. 1 tab in morning and 1/2 at night [provider]  Active Self           Med Note CLAUD, MICHEAL DASEN   Wed Dec 27, 2020  8:38 AM)    Multiple Vitamins-Minerals (PRESERVISION AREDS 2 PO) [id number] Yes Take 1 tablet by mouth in the morning and at bedtime. [provider]  Active Self  nitroGLYCERIN  (NITROSTAT ) 0.4 MG SL tablet [id number]  Place 1 tablet (0.4 mg total) under the tongue every 5 (five) minutes as needed for chest pain. Claudene Pacific, MD  Active Self           Med Note ALDONA DAWNA SAILOR   Fri Jul 12, 2022  6:07 AM) Never used as of 07/12/22.  ONETOUCH DELICA LANCETS FINE MISC  [id number]  Use to check sugar daily Trixie File, MD  Active Self  OZEMPIC , 0.25 OR 0.5 MG/DOSE, 2 MG/3ML NELMA [id number] Yes INJECT SUBCUTANEOUSLY 0.5 MG  EVERY WEEK Trixie File, MD  Active   tamsulosin  (FLOMAX ) 0.4 MG CAPS capsule [id number] Yes TAKE 1 CAPSULE BY MOUTH DAILY Nafziger, Darleene, NP  Active               Assessment/Plan:   Diabetes: - Currently uncontrolled but improving - Reviewed long term cardiovascular and renal outcomes of uncontrolled blood sugar - Reviewed goal A1c, goal fasting, and goal 2 hour post prandial glucose - Reviewed dietary modifications including low carb diets and eating regular meals - Reviewed [facility] modifications including[facility] -Continue current medication therapy. Counseled patient check his sugars at bedtime in addition to fasting. Consider addition of farxiga at next f/u with endo for further  kidney protection. Would also recommend stopping glipizide  at that time - Recommend to check glucose once daily at varying times (fasting and post-prandial, keep a log  Hypertension/Afib: - Currently controlled - Reviewed long term cardiovascular and renal outcomes of uncontrolled blood pressure - Reviewed appropriate blood pressure monitoring technique and reviewed goal blood pressure. Recommended to check home blood pressure and heart rate 2-3x/week -Continue current medication therapy    Follow Up Plan: 4 months (1 month after endo) Jon VEAR [place], PharmD Clinical Pharmacist [phone]

## 2024-03-04 DIAGNOSIS — H34811 Central retinal vein occlusion, right eye, with macular edema: Secondary | ICD-10-CM | POA: Diagnosis not present

## 2024-03-10 ENCOUNTER — Ambulatory Visit: Admitting: Podiatry

## 2024-03-13 ENCOUNTER — Other Ambulatory Visit: Payer: Self-pay | Admitting: Adult Health

## 2024-03-13 DIAGNOSIS — N401 Enlarged prostate with lower urinary tract symptoms: Secondary | ICD-10-CM

## 2024-04-30 ENCOUNTER — Telehealth: Payer: Self-pay | Admitting: Dietician

## 2024-04-30 DIAGNOSIS — E1159 Type 2 diabetes mellitus with other circulatory complications: Secondary | ICD-10-CM

## 2024-04-30 NOTE — Telephone Encounter (Signed)
 Patient called and states that he needs a prescription for a Contour Glucose Meter, lancets, and strips as he received a letter stating that [facility] discontinued the One Touch.  Will message CMA.  Leita Constable, RD, LDN, CDCES, DipACLM

## 2024-05-03 ENCOUNTER — Other Ambulatory Visit: Payer: Self-pay | Admitting: Cardiology

## 2024-05-03 DIAGNOSIS — E538 Deficiency of other specified B group vitamins: Secondary | ICD-10-CM

## 2024-05-03 DIAGNOSIS — E1122 Type 2 diabetes mellitus with diabetic chronic kidney disease: Secondary | ICD-10-CM

## 2024-05-03 DIAGNOSIS — H35033 Hypertensive retinopathy, bilateral: Secondary | ICD-10-CM | POA: Diagnosis not present

## 2024-05-03 DIAGNOSIS — H43812 Vitreous degeneration, left eye: Secondary | ICD-10-CM | POA: Diagnosis not present

## 2024-05-03 DIAGNOSIS — H34811 Central retinal vein occlusion, right eye, with macular edema: Secondary | ICD-10-CM | POA: Diagnosis not present

## 2024-05-03 DIAGNOSIS — H353133 Nonexudative age-related macular degeneration, bilateral, advanced atrophic without subfoveal involvement: Secondary | ICD-10-CM | POA: Diagnosis not present

## 2024-05-03 DIAGNOSIS — H35372 Puckering of macula, left eye: Secondary | ICD-10-CM | POA: Diagnosis not present

## 2024-05-03 DIAGNOSIS — E113293 Type 2 diabetes mellitus with mild nonproliferative diabetic retinopathy without macular edema, bilateral: Secondary | ICD-10-CM | POA: Diagnosis not present

## 2024-05-03 MED ORDER — METOPROLOL SUCCINATE ER 100 MG PO TB24: ORAL_TABLET | ORAL | 2 refills | Status: AC

## 2024-05-03 MED ORDER — ATORVASTATIN CALCIUM 40 MG PO TABS: 40.0000 mg | ORAL_TABLET | Freq: Every day | ORAL | 2 refills | Status: AC

## 2024-05-03 MED ORDER — ELIQUIS 5 MG PO TABS: 5.0000 mg | ORAL_TABLET | Freq: Two times a day (BID) | ORAL | 1 refills | Status: DC

## 2024-05-03 NOTE — Telephone Encounter (Signed)
 Prescription refill request for Eliquis  received. Indication: AF Last office visit: 01/19/24  JINNY Bergamo MD Scr: 1.00 on 10/22/23  Epic Age: 83 Weight: 110.9kg  Based on above findings Eliquis  5mg  twice daily is the appropriate dose.  Refill approved.

## 2024-05-03 NOTE — Telephone Encounter (Signed)
 RX sent in

## 2024-05-03 NOTE — Telephone Encounter (Signed)
*  STAT* If patient is at the pharmacy, call can be transferred to refill team.   1. Which medications need to be refilled? (please list name of each medication and dose if known)   atorvastatin  (LIPITOR) 40 MG tablet  metoprolol  succinate (TOPROL -XL) 100 MG 24 hr tablet  ELIQUIS  5 MG TABS tablet   2. Would you like to learn more about the convenience, safety, & potential cost savings by using the [facility]? No   3. Are you open to using the [facility] (Type [facility].  ) No   4. Which pharmacy/location (including street and city if local pharmacy) is medication to be sent to?  [facility] - [place], [place] - [address] [place]   5. Do they need a 30 day or 90 day supply? 90 day

## 2024-05-05 MED ORDER — ACCU-CHEK SOFTCLIX LANCETS MISC: 12 refills | Status: DC

## 2024-05-05 MED ORDER — ACCU-CHEK GUIDE TEST VI STRP: ORAL_STRIP | 12 refills | Status: DC

## 2024-05-05 MED ORDER — ACCU-CHEK GUIDE W/DEVICE KIT: PACK | 0 refills | Status: DC

## 2024-05-05 NOTE — Telephone Encounter (Signed)
 Per His insurance they will cover Contour or Accu-chek.   I have sent in Accu-Chek.

## 2024-05-07 ENCOUNTER — Encounter: Payer: Self-pay | Admitting: Internal Medicine

## 2024-05-07 MED ORDER — CONTOUR NEXT TEST VI STRP: ORAL_STRIP | 12 refills | Status: AC

## 2024-05-07 MED ORDER — CONTOUR NEXT MONITOR W/DEVICE KIT: PACK | 0 refills | Status: AC

## 2024-05-07 MED ORDER — LANCETS MISC: 3 refills | Status: AC

## 2024-05-20 ENCOUNTER — Ambulatory Visit: Admitting: Internal Medicine

## 2024-05-20 ENCOUNTER — Encounter: Payer: Self-pay | Admitting: Internal Medicine

## 2024-05-20 VITALS — BP 130/80 | HR 80 | Ht 70.0 in | Wt 240.8 lb

## 2024-05-20 DIAGNOSIS — E1165 Type 2 diabetes mellitus with hyperglycemia: Secondary | ICD-10-CM

## 2024-05-20 DIAGNOSIS — E1122 Type 2 diabetes mellitus with diabetic chronic kidney disease: Secondary | ICD-10-CM | POA: Diagnosis not present

## 2024-05-20 DIAGNOSIS — E538 Deficiency of other specified B group vitamins: Secondary | ICD-10-CM | POA: Diagnosis not present

## 2024-05-20 DIAGNOSIS — E785 Hyperlipidemia, unspecified: Secondary | ICD-10-CM

## 2024-05-20 DIAGNOSIS — E1159 Type 2 diabetes mellitus with other circulatory complications: Secondary | ICD-10-CM

## 2024-05-20 DIAGNOSIS — N182 Chronic kidney disease, stage 2 (mild): Secondary | ICD-10-CM

## 2024-05-20 DIAGNOSIS — R7989 Other specified abnormal findings of blood chemistry: Secondary | ICD-10-CM

## 2024-05-20 DIAGNOSIS — Z7984 Long term (current) use of oral hypoglycemic drugs: Secondary | ICD-10-CM

## 2024-05-20 LAB — POCT GLYCOSYLATED HEMOGLOBIN (HGB A1C): Hemoglobin A1C: 6.8 % — AB (ref 4.0–5.6)

## 2024-05-20 MED ORDER — GLIPIZIDE 5 MG PO TABS: 5.0000 mg | ORAL_TABLET | Freq: Every day | ORAL | 3 refills | Status: AC

## 2024-05-20 NOTE — Progress Notes (Signed)
 Patient ID: Charles [name], male   DOB: Sep 19, 1940, 83 y.o.   MRN: [id number]  HPI: Charles [name] is an 83 y.o.-year-old male, returning for f/u for DM2 dx 2008, non-insulin -dependent, uncontrolled, with complications (CAD - s/p NSTEMI, PN, mild CKD). Last visit 4 months ago.  Interim history: No increased urination, blurry vision, nausea, chest pain. He has left shoulder pain (rotator cuff tear).  No steroid inj's, no surgery. He did PT. pain improved.  DM2: Reviewed HbA1c levels: Lab Results  Component Value Date   HGBA1C 7.5 (A) 01/16/2024   HGBA1C 8.3 (A) 09/19/2023   HGBA1C 8.1 (A) 05/19/2023   HGBA1C 7.8 (A) 01/14/2023   HGBA1C 7.7 (A) 09/13/2022   HGBA1C 5.9 (A) 05/08/2022   HGBA1C 7.7 (A) 12/19/2021   HGBA1C 7.5 (A) 08/20/2021   HGBA1C 6.9 (A) 05/01/2021   HGBA1C 7.3 (A) 12/19/2020   HGBA1C 7.9 (A) 08/21/2020   HGBA1C 7.3 (A) 05/03/2020   HGBA1C 7.4 (A) 12/22/2019   HGBA1C 8.0 (A) 08/19/2019   HGBA1C 7.6 (A) 05/06/2019   HGBA1C 7.8 (A) 12/24/2018   HGBA1C 7.9 (H) 09/29/2018   HGBA1C 7.1 (A) 08/24/2018   HGBA1C 7.0 (A) 05/04/2018   HGBA1C 7.0 12/11/2017  12/2013: 8.6%  Pt is on a regimen of: - Metformin  1000 mg 2x a day >> 2000 mg with dinner (but sugars worse) >> 1000 mg 2x a day - Glipizide  IR 5 mg before dinner - taking it after dinner - Ozempic  0.25 >> 0.5 mg weekly In the past, I recommended  NPH 14 units at bedtime, but he did not start. We tried Rybelsus  3 >> 6 mg daily-added 12/2018 >> $$$ >> stopped 01/2019 He checked with his insurance and none of the SGLT2 inhibitors are covered for him.  Pt checks his sugars once a day: - am:  92-151, 158 >> 124-179, 190 >> 109, 130-150, 180 >> 91-151, 167 (forgot Metformin  and Glipizide ) - 2h after b'fast: 68x1 >> n/c >> 201, 206 >> n/c  - before lunch:  n/c >> 69, 79 >> n/c >> 90 x1 >> n/c - 2h after lunch: 123-176 >> n/c >> 317 >> n/c - before dinner:90s >> n/c >> 80 >> n/c >> 124 >> n/c - 2h after dinner: n/c >> 240  >> n/c - bedtime: 110-150 >> 160s >> n/c Lowest sugar was  50 >> ... 92 >> 100 >> 91; he has hypoglycemia awareness in the 80s. Highest sugar was 317  (forgot meds) >> 158 >> 180 >> 167  He sees nutrition.  Per their note from 08/11/2023: Current meal patterns:  - Breakfast: can vary, can be toast or bowl of cereal, eggs 1-2x/week - Lunch usually go out with a friend, country food  - Supper at home, varies - Snacks frequently: candy, chips - Drinks coffee with sweetener  -+ CKD, last BUN/creatinine:  Lab Results  Component Value Date   BUN 15 10/22/2023   CREATININE 1.00 10/22/2023   Lab Results  Component Value Date   MICRALBCREAT 282 (H) 01/16/2024   MICRALBCREAT 783 (H) 10/02/2023   MICRALBCREAT 707 (H) 09/19/2023   MICRALBCREAT 197.7 (H) 11/17/2009   MICRALBCREAT 112.4 (H) 11/11/2008   MICRALBCREAT 18.7 03/18/2008   MICRALBCREAT 36.3 (H) 11/06/2007   MICRALBCREAT 155.0 (H) 11/27/2006  Of note, he has a history of nephrectomy. On lisinopril  5 mg daily - started 09/2023.  -+ HL; last set of lipids: Lab Results  Component Value Date   CHOL 139 10/22/2023   HDL  44.70 10/22/2023   LDLCALC 57 10/22/2023   LDLDIRECT 139.8 07/08/2014   TRIG 183.0 (H) 10/22/2023   CHOLHDL 3 10/22/2023  On Lipitor 40.  - last eye exam was 01/2024: No DR; now seeing Dr. Burundi and Dr. Raj - retinal vein occlusion, + cataracts, + macular hole.  He has a history of eye surgeries.  He gets intraocular injections  - his numbness and tingling in feet improved after starting B12. Foot exam 02/18/2024 by Dr. Janit.  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  B12 deficiency: -Diagnosed during investigation for numbness and tingling (peripheral neuropathy) -He was initially on B12 injections, then on 5000 mcg daily -He was on 5000 mcg every 3 days, changed by  PCP 09/2019 -We changed to 5000 mcg weekly 09/2022  Reviewed his B12 levels: Lab Results  Component Value Date   VITAMINB12 934 (H) 05/19/2023   VITAMINB12 >1500 (H) 09/13/2022   VITAMINB12 832 10/09/2021   VITAMINB12 900 10/06/2020   VITAMINB12 748 12/22/2019   VITAMINB12 1,033 (H) 10/08/2019   VITAMINB12 1,172 (H) 08/19/2018   VITAMINB12 >1500 (H) 05/04/2018   VITAMINB12 1,249 (H) 02/18/2017   VITAMINB12 234 10/22/2016   He was admitted with NSTEMI 10/18/2015.  He had stents placed then.  He was going to the gym 5 out of 7 days, but stopped during the coronavirus pandemic.  He restarted to go to the gym afterwards.  ROS: + See HPI  I reviewed pt's medications, allergies, PMH, social hx, family hx, and changes were documented in the history of present illness. Otherwise, unchanged from my initial visit note.  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   Coronary artery disease    Dysrhythmia    PAF   Eczema    GERD (gastroesophageal reflux disease)    Hernia, ventral    History of blood transfusion    Hyperlipidemia    Hypertension    Myocardial infarction (HCC)    Neuropathy    Obesity    Personal history of adenomatous colonic polyps 07/20/2012   3 + adenomas 2009 07/20/2012 2 diminutive polyps     Pneumonia 1942; 1950s X 1   Type II diabetes mellitus (HCC)    type II   Walking pneumonia 2000's X 1   Past Surgical History:  Procedure Laterality Date   A-FLUTTER ABLATION N/A 07/12/2022   Procedure: A-FLUTTER ABLATION;  Surgeon: Nancey Eulas BRAVO, MD;  Location: [facility] INVASIVE CV LAB;  Service: Cardiovascular;  Laterality: N/A;   ATRIAL FIBRILLATION ABLATION N/A 07/12/2022   Procedure: ATRIAL FIBRILLATION ABLATION;  Surgeon: Nancey Eulas BRAVO, MD;  Location: [facility];   Service: Cardiovascular;  Laterality: N/A;   CARDIAC CATHETERIZATION N/A 10/19/2015   Procedure: Left Heart Cath and Coronary Angiography;  Surgeon: Rober Chroman, MD;  Location: [facility] INVASIVE CV LAB;  Service: Cardiovascular;  Laterality: N/A;   CARDIAC CATHETERIZATION N/A 10/19/2015   Procedure: Coronary Stent Intervention;  Surgeon: Rober Chroman, MD;  Location: [facility] INVASIVE CV [facility];  Service: Cardiovascular;  Laterality: N/A;   CATARACT EXTRACTION, BILATERAL Bilateral 01/2018   Dr. Rosan did surgery  and he developed MD and is seeing specialist now for MD treatment   COLONOSCOPY     nothing showed up this time   COLONOSCOPY W/ BIOPSIES AND POLYPECTOMY  X 1   CORONARY ANGIOPLASTY     EXCISION MASS NECK Left 01/08/2021   Procedure: EXCISION MASS NECK;  Surgeon: Carlie Clark, MD;  Location: [facility] OR;  Service: ENT;  Laterality: Left;   FEMUR IM NAIL Right 07/26/2013   Procedure: INTRAMEDULLARY (IM) NAIL FEMORAL subtrochanteric;  Surgeon: Donnice JONETTA Car, MD;  Location: [facility] OR;  Service: Orthopedics;  Laterality: Right;   FRACTURE SURGERY     INGUINAL HERNIA REPAIR Left >3 times   INGUINAL HERNIA REPAIR Right 2012   NEPHRECTOMY Left 2000   TONSILLECTOMY  1940s   Social History   Socioeconomic History   Marital status: Widowed    Spouse name: Not on file   Number of children: 3   Years of education: Not on file   Highest education level: Master's degree (e.g., MA, MS, MEng, MEd, MSW, MBA)  Occupational History   Occupation: retired  Tobacco Use   Smoking status: Former    Current packs/day: 0.00    Types: Cigarettes, Pipe, Cigars    Start date: 09/09/1948    Quit date: 09/09/1968    Years since quitting: 55.7   Smokeless tobacco: Never   Tobacco comments:    smoking cigars- quit in 2006  , quit pipe 2000  Vaping Use   Vaping status: Never Used  Substance and Sexual Activity   Alcohol use: Not Currently    Alcohol/week: 2.0 standard drinks of alcohol    Types: 2 Glasses of wine per  week    Comment: 2 glasses on Tuesday   Drug use: No   Sexual activity: Never  Other Topics Concern   Not on file  Social History Narrative   Regular exercise: goes to the [facility] and walks   Caffeine use: daily; coffee   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     widowed    Three children, One in [place], one in [place] ,and one in [place]    HH - 2 : youngest daughter lives with him   Social Drivers of Corporate investment banker Strain: Low Risk  (01/07/2024)   Overall Financial Resource Strain (CARDIA)    Difficulty of Paying Living Expenses: Not hard at all  Food Insecurity: No Food Insecurity (01/07/2024)   Hunger Vital Sign    Worried About Running Out of Food in the Last Year: Never true    Ran Out of Food in the Last Year: Never true  Transportation Needs: No Transportation Needs (01/07/2024)   PRAPARE - Administrator, Civil Service (Medical): No    Lack of Transportation (Non-Medical): No  Physical Activity: Sufficiently Active (01/07/2024)   Exercise Vital Sign    Days of Exercise per Week: 5 days    Minutes of Exercise per Session: 40 min  Stress: No Stress Concern Present (01/07/2024)   [employer] of Occupational Health - Occupational Stress Questionnaire    Feeling of Stress : Not at all  Social Connections: Moderately Isolated (01/07/2024)   Social Connection and Isolation Panel    Frequency of Communication with Friends and Family: More than three times a week    Frequency of Social Gatherings with Friends and Family: More than three times a week    Attends Religious Services: More than 4 times per year    Active Member of  Clubs or Organizations: No    Attends Banker Meetings: Never    Marital Status: Widowed  Intimate Partner Violence: Not At Risk (01/07/2024)   Humiliation, Afraid, Rape, and Kick questionnaire    Fear of Current or Ex-Partner: No    Emotionally Abused: No    Physically Abused: No    Sexually Abused: No    Current Outpatient Medications on File Prior to Visit  Medication Sig Dispense Refill   atorvastatin  (LIPITOR) 40 MG tablet Take 1 tablet (40 mg total) by mouth daily at 6 PM. 90 tablet 2   Blood Glucose Monitoring Suppl (ACCU-CHEK GUIDE) w/Device KIT Use to check blood sugar once a day DxCode:E11.59 1 kit 0   Blood Glucose Monitoring Suppl (CONTOUR NEXT MONITOR) w/Device KIT Use to check blood sugar once a day DxCode:E11.59 1 kit 0   Cholecalciferol  (VITAMIN D -3) 125 MCG (5000 UT) TABS Take 5,000 Units by mouth in the morning.     Cyanocobalamin  (VITAMIN B-12) 5000 MCG SUBL Place 5,000 mcg under the tongue every 3 (three) days.     cyclobenzaprine  (FLEXERIL ) 10 MG tablet Take 1 tablet (10 mg total) by mouth 3 (three) times daily as needed for muscle spasms. 30 tablet 0   ELIQUIS  5 MG TABS tablet Take 1 tablet (5 mg total) by mouth 2 (two) times daily. 180 tablet 1   EPINEPHrine  0.3 mg/0.3 mL IJ SOAJ injection Inject 0.3 mg into the muscle as needed for anaphylaxis.     glipiZIDE  (GLUCOTROL ) 5 MG tablet Take 1 tablet (5 mg total) by mouth daily before supper. 90 tablet 3   glucose blood (CONTOUR NEXT TEST) test strip Use to check blood sugar once a day DxCode:E11.59 100 each 12   Lancets MISC Use to check blood sugar once a day DxCode:E11.59 100 each 3   lisinopril  (ZESTRIL ) 5 MG tablet TAKE 1 TABLET BY MOUTH DAILY 90 tablet 3   metFORMIN  (GLUCOPHAGE ) 1000 MG tablet Take 1 tablet (1,000 mg total) by mouth 2 (two) times daily with a meal. 200 tablet 3   metoprolol  succinate (TOPROL -XL) 100 MG 24 hr tablet Take 1 Tablet in the morning and take 1/2 Tablet at night 135 tablet 2   Multiple Vitamins-Minerals (PRESERVISION AREDS 2 PO) Take 1 tablet by mouth in the morning and at bedtime.     nitroGLYCERIN  (NITROSTAT ) 0.4 MG SL tablet Place 1 tablet (0.4 mg total) under the tongue every 5 (five) minutes as needed for chest pain. 25 tablet 1   OZEMPIC , 0.25 OR 0.5 MG/DOSE, 2 MG/3ML SOPN INJECT  SUBCUTANEOUSLY 0.5 MG  EVERY WEEK 9 mL 3   tamsulosin  (FLOMAX ) 0.4 MG CAPS capsule TAKE 1 CAPSULE BY MOUTH DAILY 100 capsule 2   No current facility-administered medications on file prior to visit.   Allergies  Allergen Reactions   Bee Venom Anaphylaxis   Family History  Problem Relation Age of Onset   Diabetes Mother    Diabetes Sister    Drug abuse Other    Cancer Other    Heart disease Other    Lung disease Other    Colon cancer Neg Hx    Stomach cancer Neg Hx    PE: BP 130/80   Pulse 80   Ht 5' 10 (1.778 m)   Wt 240 lb 12.8 oz (109.2 kg)   SpO2 97%   BMI 34.55 kg/m  Wt Readings from Last 10 Encounters:  05/20/24 240 lb 12.8 oz (109.2 kg)  02/20/24 241 lb (109.3  kg)  02/18/24 244 lb 8 oz (110.9 kg)  01/19/24 244 lb 8 oz (110.9 kg)  01/16/24 246 lb 6.4 oz (111.8 kg)  10/22/23 251 lb (113.9 kg)  09/19/23 250 lb 12.8 oz (113.8 kg)  09/15/23 250 lb (113.4 kg)  05/19/23 248 lb 3.2 oz (112.6 kg)  05/09/23 245 lb (111.1 kg)   Constitutional: overweight, in NAD Eyes: EOMI, no exophthalmos ENT: no thyromegaly, no cervical lymphadenopathy Cardiovascular: RRR, No MRG Respiratory: CTA B Musculoskeletal: no deformities Skin:  no rashes Neurological: no tremor with outstretched hands  ASSESSMENT: 1. DM2, not insulin -dependent, uncontrolled, with complications - CAD - s/p NSTEMI (Dr. Levern) - peripheral neuropathy - CKD   He does not have a family history of medullary thyroid  cancer or personal history of pancreatitis.   2. B12 def  3.  Peripheral neuropathy  4. HL  5.  History of elevated TSH -In the setting of amiodarone  treatment  PLAN:  1. Patient with longstanding, uncontrolled, type 2 diabetes, previously with significant improvement in control after adding Ozempic  but then off the medication due to price.  Fortunately, he was able to restart Ozempic  1 month prior to our last appointment and he continues it at a low dose of 0.5 mg weekly.  He also  continues metformin .  At last visit, sugars were better but he was only checking in the morning.  His HbA1c was improved to 7.5%, but was still above target.  I suggested to add glipizide  IR before dinner.  I also recommended to check some blood sugars later in the day. -At today's visit, sugars appear to be mostly at goal in the morning but he has occasional hyperglycemic spikes.  He feels that these are related to maybe skipping the medication the night before.  Upon questioning, he forgets to take glipizide  before dinner and he takes this after dinner.  He still has glipizide  ER at home so he was using these, but he is running out.  At today's visit, I refilled his glipizide  IR and advised him to take it before dinner.  To avoid hypoglycemia overnight, I advised him to not take the glipizide  if he forgets to take it before the meal.  Otherwise, we will continue the rest of the regimen. - I advised him to: Patient Instructions  Please continue: - Metformin  1000 mg 2x a day with meals - Ozempic  0.5 mg weekly in a.m.   Change: - Glipizide  IR 5 mg before dinner  Please check some sugars later in the day, also.   Please return in 4 months with your sugar log.  - we checked his HbA1c: 6.8% (lower) - advised to check sugars at different times of the day - 1x a day, rotating check times - advised for yearly eye exams >> he is UTD - he had an elevated ACR in the past.  I suggested a referral to nephrology but he did not have this yet.  PCP added back lisinopril  low-dose.  The urine ACR improved.  Further management per PCP.  Will continue to keep an eye on this. - return to clinic in 3-4 months  2.  And 3. Peripheral neuropathy and B12 deficiency -He has peripheral neuropathy probably related to both diabetes and B12 deficiency -We started B12 supplementation, which we were able to then reduce to 5000 mcg weekly - At last check, the B12 level was only slightly above target, so we continued the same  regimen  4. HL - Latest lipid panel was reviewed  from 10/2023: LDL only slightly higher than our goal of less than 55, triglycerides also slightly elevated: Lab Results  Component Value Date   CHOL 139 10/22/2023   HDL 44.70 10/22/2023   LDLCALC 57 10/22/2023   LDLDIRECT 139.8 07/08/2014   TRIG 183.0 (H) 10/22/2023   CHOLHDL 3 10/22/2023  -He continues on Lipitor 40 mg daily without side effects  5.  High TSH -He had an elevated TSH, 9.93, in the past while on amiodarone , but he stopped amiodarone  since - Latest TSH was normal Lab Results  Component Value Date   TSH 2.78 10/22/2023  - No signs or symptoms of hypothyroidism - Will continue to manage expectantly  Lela Fendt, MD PhD [facility] Endocrinology

## 2024-05-20 NOTE — Patient Instructions (Addendum)
 Please continue: - Metformin  1000 mg 2x a day with meals - Ozempic  0.5 mg weekly in a.m.   Change: - Glipizide  IR 5 mg before dinner  Please check some sugars later in the day, also.   Please return in 4 months with your sugar log.

## 2024-05-25 DIAGNOSIS — L905 Scar conditions and fibrosis of skin: Secondary | ICD-10-CM | POA: Diagnosis not present

## 2024-05-25 DIAGNOSIS — L821 Other seborrheic keratosis: Secondary | ICD-10-CM | POA: Diagnosis not present

## 2024-05-25 DIAGNOSIS — Z85828 Personal history of other malignant neoplasm of skin: Secondary | ICD-10-CM | POA: Diagnosis not present

## 2024-05-25 DIAGNOSIS — D225 Melanocytic nevi of trunk: Secondary | ICD-10-CM | POA: Diagnosis not present

## 2024-05-25 DIAGNOSIS — L570 Actinic keratosis: Secondary | ICD-10-CM | POA: Diagnosis not present

## 2024-06-18 ENCOUNTER — Other Ambulatory Visit

## 2024-06-23 ENCOUNTER — Other Ambulatory Visit

## 2024-07-07 ENCOUNTER — Other Ambulatory Visit

## 2024-07-07 DIAGNOSIS — E119 Type 2 diabetes mellitus without complications: Secondary | ICD-10-CM

## 2024-07-07 NOTE — Progress Notes (Signed)
 07/07/2024 Name: Charles [name] MRN: [id number] DOB: 1941-04-16  Chief Complaint  Patient presents with   Medication Management    KANE KUSEK is a 83 y.o. year old male who presented for a telephone visit.   They were referred to the pharmacist by their PCP for assistance in managing diabetes, hypertension, and complex medication management.    Subjective:  Care Team: Primary Care Provider: Merna Huxley, NP  Medication Access/Adherence  Current Pharmacy:  [facility]) - [place], [place] - [address] [facility] [address] [place] [address] [place] [address] Phone: [phone] Fax: [phone]  [facility] - [address] [place]), [place] - [address] [place] [address] Phone: [phone] Fax: [phone]  [facility] - [place], [place] - [address] [place]  [address] Phone: [phone] Fax: [phone]   Patient reports affordability concerns with their medications: No  Patient reports access/transportation concerns to their pharmacy: No  Patient reports adherence concerns with their medications:  No  Diabetes:  Current medications: Glipizide  5mg  1 tab at dinner, Ozempic  0.5mg , Metformin  1000mg  BID Medications tried in the past: None  Current glucose readings: reports mostly in goal, denies any low BG since seeing endo last   Patient denies hypoglycemic s/sx including dizziness, shakiness, sweating. Patient denies hyperglycemic symptoms including polyuria, polydipsia, polyphagia, nocturia, neuropathy, blurred vision.  Current meal patterns:  - Breakfast: can vary, can be toast or bowl of cereal, eggs 1-2x/week - Lunch usually go out with a friend, country food  - Supper at home, varies - Snacks frequently, candy and chips - Drinks coffee with sweetener  Current physical activity: goes to the [employer]  Current medication  access support: None  Hypertension/Afib:  Current medications: Metoprolol  XL 100mg  BID, Eliquis  5mg  BID, Lisinopril  5mg  Medications previously tried: Spironolactone   Patient does not have a validated, automated, upper arm home BP cuff, [facility] Current blood pressure readings readings: 110s-120/60s  Patient denies hypotensive s/sx including dizziness, lightheadedness.  Patient denies hypertensive symptoms including headache, chest pain, shortness of breath  Current meal patterns: see above  Current physical activity: see above   Objective:  Lab Results  Component Value Date   HGBA1C 6.8 (A) 05/20/2024    Lab Results  Component Value Date   CREATININE 1.00 10/22/2023   BUN 15 10/22/2023   NA 140 10/22/2023   K 4.8 10/22/2023   CL 100 10/22/2023   CO2 32 10/22/2023    Lab Results  Component Value Date   CHOL 139 10/22/2023   HDL 44.70 10/22/2023   LDLCALC 57 10/22/2023   LDLDIRECT 139.8 07/08/2014   TRIG 183.0 (H) 10/22/2023   CHOLHDL 3 10/22/2023    Medications Reviewed Today     Reviewed by Lionell Jon DEL, RPH (Pharmacist) on 07/07/24 at [phone]  Med List Status: <None>   Medication Order Taking? Sig Documenting Provider Last Dose Status Informant  atorvastatin  (LIPITOR) 40 MG tablet [id number] Yes Take 1 tablet (40 mg total) by mouth daily at 6 PM. Ladona Heinz, MD  Active   Blood Glucose Monitoring Suppl (CONTOUR NEXT MONITOR) w/Device KIT [id number]  Use to check blood sugar once a day DxCode:E11.59 Trixie File, MD  Active   Cholecalciferol  (VITAMIN D -3) 125 MCG (5000 UT) TABS [id number] Yes Take 5,000 Units by mouth in the morning. [provider]  Active Self  Cyanocobalamin  (VITAMIN B-12) 5000 MCG  SUBL [id number]  Place 5,000 mcg under the tongue every 3 (three) days. [provider]  Active Self           Med Note JANEAN JON VEAR Pablo Jul 07, 2023  9:31 AM) Once a week  cyclobenzaprine  (FLEXERIL ) 10 MG tablet [id number] Yes  Take 1 tablet (10 mg total) by mouth 3 (three) times daily as needed for muscle spasms. Nafziger, Darleene, NP  Active   ELIQUIS  5 MG TABS tablet [id number] Yes Take 1 tablet (5 mg total) by mouth 2 (two) times daily. Ladona Heinz, MD  Active   EPINEPHrine  0.3 mg/0.3 mL IJ SOAJ injection [id number]  Inject 0.3 mg into the muscle as needed for anaphylaxis. [provider]  Active Self           Med Note ALDONA DAWNA SAILOR   Fri Jul 12, 2022  6:06 AM) Used over 10 years ago.  glipiZIDE  (GLUCOTROL ) 5 MG tablet [id number] Yes Take 1 tablet (5 mg total) by mouth daily before supper. Trixie File, MD  Active   glucose blood (CONTOUR NEXT TEST) test strip [id number]  Use to check blood sugar once a day DxCode:E11.59 Trixie File, MD  Active   Lancets MISC [id number]  Use to check blood sugar once a day DxCode:E11.59 Trixie File, MD  Active   lisinopril  (ZESTRIL ) 5 MG tablet [id number] Yes TAKE 1 TABLET BY MOUTH DAILY Nafziger, Darleene, NP  Active   metFORMIN  (GLUCOPHAGE ) 1000 MG tablet [id number] Yes Take 1 tablet (1,000 mg total) by mouth 2 (two) times daily with a meal. Trixie File, MD  Active   metoprolol  succinate (TOPROL -XL) 100 MG 24 hr tablet [id number] Yes Take 1 Tablet in the morning and take 1/2 Tablet at night Ladona Heinz, MD  Active   Multiple Vitamins-Minerals (PRESERVISION AREDS 2 PO) [id number] Yes Take 1 tablet by mouth in the morning and at bedtime. [provider]  Active Self  nitroGLYCERIN  (NITROSTAT ) 0.4 MG SL tablet [id number]  Place 1 tablet (0.4 mg total) under the tongue every 5 (five) minutes as needed for chest pain. Claudene Pacific, MD  Active Self           Med Note ALDONA DAWNA SAILOR   Fri Jul 12, 2022  6:07 AM) Never used as of 07/12/22.  OZEMPIC , 0.25 OR 0.5 MG/DOSE, 2 MG/3ML NELMA [id number] Yes INJECT SUBCUTANEOUSLY 0.5 MG  EVERY WEEK Gherghe, Cristina, MD  Active   tamsulosin  (FLOMAX ) 0.4 MG CAPS capsule [id number] Yes TAKE 1 CAPSULE BY MOUTH DAILY Nafziger,  Darleene, NP  Active               Assessment/Plan:   Diabetes: - Currently controlled - Reviewed long term cardiovascular and renal outcomes of uncontrolled blood sugar - Reviewed goal A1c, goal fasting, and goal 2 hour post prandial glucose - Reviewed dietary modifications including low carb diets and eating regular meals - Reviewed [facility] modifications including[facility] -Continue current medication therapy. Counseled patient check his sugars at bedtime in addition to fasting. Consider addition of farxiga at next f/u with endo for further kidney protection. Would also recommend stopping glipizide  at that time - Recommend to check glucose once daily at varying times (fasting and post-prandial, keep a log  Hypertension/Afib: - Currently controlled - Reviewed long term cardiovascular and renal outcomes of uncontrolled blood pressure - Reviewed appropriate blood pressure monitoring technique and reviewed goal blood pressure. Recommended to check home blood pressure and heart rate 2-3x/week -Continue  current medication therapy    Follow Up Plan: 4 months (1 month after endo) Jon VEAR [place], PharmD Clinical Pharmacist [phone]

## 2024-08-09 ENCOUNTER — Ambulatory Visit: Attending: Cardiology | Admitting: Cardiology

## 2024-08-09 ENCOUNTER — Encounter: Payer: Self-pay | Admitting: Cardiology

## 2024-08-09 VITALS — BP 130/78 | HR 77 | Resp 16 | Ht 70.0 in | Wt 241.6 lb

## 2024-08-09 DIAGNOSIS — E7800 Pure hypercholesterolemia, unspecified: Secondary | ICD-10-CM

## 2024-08-09 DIAGNOSIS — I2510 Atherosclerotic heart disease of native coronary artery without angina pectoris: Secondary | ICD-10-CM | POA: Diagnosis not present

## 2024-08-09 DIAGNOSIS — I10 Essential (primary) hypertension: Secondary | ICD-10-CM | POA: Diagnosis not present

## 2024-08-09 DIAGNOSIS — I480 Paroxysmal atrial fibrillation: Secondary | ICD-10-CM | POA: Diagnosis not present

## 2024-08-09 NOTE — Progress Notes (Signed)
 Cardiology Office Note:  .   Date:  08/09/2024  ID:  [name], DOB [birth year], MRN [id number] PCP: Merna Huxley, NP  [facility] Providers Cardiologist:  Gordy Bergamo, MD   History of Present Illness: .   Charles [name] is a 83 y.o. 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  Doing well except for neuropathy from DM. He is tolerating Eliquis  for AF without bleeding diathesis.     Discussed the use of AI scribe software for clinical note transcription with the patient, who gave verbal consent to proceed.  History of Present Illness Charles [name] is an 83 year old male with atrial fibrillation and coronary artery disease who presents for a routine cardiovascular follow-up. He was referred by his primary care physician after his previous cardiologist, Dr. Levern, was no longer covered by his insurance.  He feels well without chest pain, palpitations, presyncope, syncope, or exertional dyspnea. 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  He has atrial fibrillation status post ablation in November 2023 and takes Eliquis . Before ablation he had shortness of breath climbing stairs, which has resolved. He reports no recurrent atrial fibrillation symptoms. He avoids stairs and uneven surfaces due to neuropathy and discomfort rather than cardiopulmonary limitation.  He has coronary artery disease with a stent placed in 2017. He takes atorvastatin  40 mg daily, lisinopril  5 mg daily, and metoprolol  68  mg (half tablet) at night, using a pill cutter.  He uses Ozempic  for weight management with initial 10 to 14 pound loss and now a weight plateau. He takes metformin  for diabetes. He reports no leg cramping, no significant edema, and his kidney function is stable.  He reports a prior myocardial infarction and still has nitroglycerin  from that event but has not needed to use it recently.  Cardiac Studies relevent.    CARDIAC CATHETERIZATION 10/19/2015: 3.5 x 15 mm Xience Alpine DES in Prox LAD     Labs   Lab Results  Component Value Date   CHOL 139 10/22/2023   HDL 44.70 10/22/2023   LDLCALC 57 10/22/2023   LDLDIRECT 139.8 07/08/2014   TRIG 183.0 (H) 10/22/2023   CHOLHDL 3 10/22/2023     No results found for: LIPOA  Recent Labs    10/22/23 0942  NA 140  K 4.8  CL 100  CO2 32  GLUCOSE 181*  BUN 15  CREATININE 1.00  CALCIUM  9.2     Lab Results  Component Value Date   ALT 22 10/22/2023   AST 22 10/22/2023   ALKPHOS 70 10/22/2023   BILITOT 0.8 10/22/2023      Latest Ref Rng & Units 10/22/2023    9:42 AM 10/11/2022    9:37 AM 07/16/2022    3:19 PM  CBC  WBC 4.0 - 10.5 K/uL 5.4  5.0  5.0   Hemoglobin 13.0 - 17.0 g/dL 85.4  86.0  87.3   Hematocrit 39.0 - 52.0 % 43.1  41.6  38.6   Platelets 150.0 - 400.0 K/uL 214.0  202.0  199.0    Lab  Results  Component Value Date   HGBA1C 6.8 (A) 05/20/2024    Lab Results  Component Value Date   TSH 2.78 10/22/2023    ROS  Review of Systems  Cardiovascular:  Negative for chest pain, dyspnea on exertion and leg swelling.   Physical Exam:   VS:  BP 130/78 (BP Location: Left Arm, Patient Position: Sitting, Cuff Size: Large)   Pulse 77   Resp 16   Ht 5' 10 (1.778 m)   Wt 241 lb 9.6 oz (109.6 kg)   SpO2 96%   BMI 34.67 kg/m    Wt Readings from Last 3 Encounters:  08/09/24 241 lb 9.6 oz (109.6 kg)  05/20/24 240 lb 12.8 oz (109.2 kg)  02/20/24 241 lb (109.3 kg)    BP Readings from Last 3 Encounters:  08/09/24 130/78   05/20/24 130/80  02/20/24 110/70   Physical Exam Constitutional:      Appearance: He is obese.  Neck:     Vascular: No carotid bruit or JVD.  Cardiovascular:     Rate and Rhythm: Normal rate and regular rhythm.     Pulses: Intact distal pulses.     Heart sounds: Normal heart sounds. No murmur heard.    No gallop.  Pulmonary:     Effort: Pulmonary effort is normal.     Breath sounds: Normal breath sounds.  Abdominal:     General: Bowel sounds are normal.     Palpations: Abdomen is soft.  Musculoskeletal:     Right lower leg: No edema.     Left lower leg: No edema.    EKG:         ASSESSMENT AND PLAN: .      ICD-10-CM   1. Coronary artery disease involving native coronary artery of native heart without angina pectoris  I25.10     2. Paroxysmal atrial fibrillation (HCC)  I48.0     3. Primary hypertension  I10     4. Pure hypercholesterolemia  E78.00      Assessment & Plan Coronary artery disease without angina Coronary artery disease is well-managed with no angina. Stent placed in 2017. LDL cholesterol is well-controlled at 57 mg/dL. No need for echocardiogram as there are no heart failure symptoms. - Continue atorvastatin  40 mg once daily - Continue current medications for coronary artery disease  Paroxysmal atrial fibrillation Managed with Eliquis . Occasional episodes resolve spontaneously. Weight is a risk factor for recurrence. Occasional episodes resolve spontaneously. - Continue Eliquis  for anticoagulation, discussed bleeding risk and to watch out for dark stools. - Monitor for symptoms of AFib recurrence  Primary hypertension Blood pressure is well-controlled at 137/80 mmHg. Current medications include lisinopril  and metoprolol . - Continue lisinopril  5 mg once daily - Continue metoprolol  6.25 mg at night  Pure hypercholesterolemia LDL cholesterol is well-controlled at 57 mg/dL with atorvastatin  therapy. - Continue atorvastatin  40 mg once  daily  Peripheral neuropathy Chronic peripheral neuropathy with no new symptoms. Discussed the role of B vitamins in nerve health and the potential impact of metformin  on B12 metabolism. Management focuses on symptom control. - Consider B1, B6, and B12 supplementation   Follow up: As from cardiac standpoint he has remained very stable, will request Darleene Shape, NP to take over the care including prescription refills and I would be available if cardiac issues were to arise.  He has remained stable with regard to atrial fibrillation as well for the past 2 years.  Signed,  Gordy Bergamo, MD, [facility] 08/09/2024, 6:28 PM [facility] [address] [place], [place] [address] Phone: [phone]. Fax:  [phone]

## 2024-08-09 NOTE — Patient Instructions (Signed)
 Medication Instructions:  Your physician recommends that you continue on your current medications as directed. Please refer to the Current Medication list given to you today.  *If you need a refill on your cardiac medications before your next appointment, please call your pharmacy*  Lab Work: NONE If you have labs (blood work) drawn today and your tests are completely normal, you will receive your results only by: MyChart Message (if you have MyChart) OR A paper copy in the mail If you have any lab test that is abnormal or we need to change your treatment, we will call you to review the results.  Testing/Procedures: NONE  Follow-Up: At [facility], you and your health needs are our priority.  As part of our continuing mission to provide you with exceptional heart care, our providers are all part of one team.  This team includes your primary Cardiologist (physician) and Advanced Practice Providers or APPs (Physician Assistants and Nurse Practitioners) who all work together to provide you with the care you need, when you need it.  Your next appointment:   As needed  Provider:   [place], MD  We recommend signing up for the patient portal called MyChart.  Sign up information is provided on this After Visit Summary.  MyChart is used to connect with patients for Virtual Visits (Telemedicine).  Patients are able to view lab/test results, encounter notes, upcoming appointments, etc.  Non-urgent messages can be sent to your provider as well.   To learn more about what you can do with MyChart, go to [email or web address].

## 2024-08-23 ENCOUNTER — Other Ambulatory Visit: Payer: Self-pay | Admitting: Cardiology

## 2024-08-23 ENCOUNTER — Other Ambulatory Visit: Payer: Self-pay | Admitting: Internal Medicine

## 2024-08-24 NOTE — Telephone Encounter (Signed)
 Prescription refill request for Eliquis  received. Indication:afib Last office visit:12/25 Scr: 1.00  2/25 Age:83 Weight:109.6  kg  Prescription refilled

## 2024-08-28 ENCOUNTER — Other Ambulatory Visit: Payer: Self-pay | Admitting: Adult Health

## 2024-09-20 NOTE — Progress Notes (Unsigned)
 Patient ID: Charles [name], male   DOB: [birth year], 84 y.o.   MRN: [id number]  HPI: Charles [name] is a 84 y.o.-year-old male, returning for f/u for DM2 dx 2008, non-insulin -dependent, uncontrolled, with complications (CAD - s/p NSTEMI, PN, mild CKD). Last visit 4 months ago.  Interim history: No increased urination, blurry vision, nausea, chest pain. He is also he is Ozempic  is not working on weight loss anymore, his weight has been approximately stable since the summer.  He still has smaller meals, though.  DM2: Reviewed HbA1c levels: Lab Results  Component Value Date   HGBA1C 6.8 (A) 05/20/2024   HGBA1C 7.5 (A) 01/16/2024   HGBA1C 8.3 (A) 09/19/2023   HGBA1C 8.1 (A) 05/19/2023   HGBA1C 7.8 (A) 01/14/2023   HGBA1C 7.7 (A) 09/13/2022   HGBA1C 5.9 (A) 05/08/2022   HGBA1C 7.7 (A) 12/19/2021   HGBA1C 7.5 (A) 08/20/2021   HGBA1C 6.9 (A) 05/01/2021   HGBA1C 7.3 (A) 12/19/2020   HGBA1C 7.9 (A) 08/21/2020   HGBA1C 7.3 (A) 05/03/2020   HGBA1C 7.4 (A) 12/22/2019   HGBA1C 8.0 (A) 08/19/2019   HGBA1C 7.6 (A) 05/06/2019   HGBA1C 7.8 (A) 12/24/2018   HGBA1C 7.9 (H) 09/29/2018   HGBA1C 7.1 (A) 08/24/2018   HGBA1C 7.0 (A) 05/04/2018  12/2013: 8.6%  Pt is on a regimen of: - Metformin  1000 mg 2x a day >> 2000 mg with dinner (but sugars worse) >> 1000 mg 2x a day - Glipizide  IR 5 mg before dinner - taking it after dinner >> before dinner - Ozempic  0.25 >> 0.5 mg weekly In the past, I recommended  NPH 14 units at bedtime, but he did not start. We tried Rybelsus  3 >> 6 mg daily-added 12/2018 >> $$$ >> stopped 01/2019 He checked with his insurance and none of the SGLT2 inhibitors are covered for him.  Pt checks his sugars once a day: - am: 124-179, 190 >> 109, 130-150, 180 >> 91-151, 167 >> 108-160 - 2h after b'fast: 68x1 >> n/c >> 201, 206 >> n/c  - before lunch:  n/c >> 69, 79 >> n/c >> 90 x1 >> n/c - 2h after lunch: 123-176 >> n/c >> 317 >> n/c - before dinner:90s >> n/c >> 80 >> n/c >>  124 >> n/c >> 130-140, 187 - 2h after dinner: n/c >> 240 >> n/c - bedtime: 110-150 >> 160s >> n/c Lowest sugar was  50 >> ...  91 >> 108; he has hypoglycemia awareness in the 80s. Highest sugar was 317  (forgot meds) >> ... 167 >> 187  He sees nutrition.  Per their note from 08/11/2023: Current meal patterns:  - Breakfast: can vary, can be toast or bowl of cereal, eggs 1-2x/week - Lunch usually go out with a friend, country food  - Supper at home, varies - soup + crackers,  - Snacks frequently: candy, chips - Drinks coffee with sweetener  -+ CKD, last BUN/creatinine:  Lab Results  Component Value Date   BUN 15 10/22/2023   CREATININE 1.00 10/22/2023   Lab Results  Component Value Date   MICRALBCREAT 282 (H) 01/16/2024   MICRALBCREAT 783 (H) 10/02/2023   MICRALBCREAT 707 (H) 09/19/2023   MICRALBCREAT 197.7 (H) 11/17/2009   MICRALBCREAT 112.4 (H) 11/11/2008   MICRALBCREAT 18.7 03/18/2008   MICRALBCREAT 36.3 (H) 11/06/2007   MICRALBCREAT 155.0 (H) 11/27/2006  Of note, he has a history of nephrectomy. On lisinopril  5 mg daily - started 09/2023.  -+ HL; last set of lipids:  Lab Results  Component Value Date   CHOL 139 10/22/2023   HDL 44.70 10/22/2023   LDLCALC 57 10/22/2023   LDLDIRECT 139.8 07/08/2014   TRIG 183.0 (H) 10/22/2023   CHOLHDL 3 10/22/2023  On Lipitor 40.  - last eye exam was 01/26/2024: No DR; now seeing Dr. Oman and Dr. Raj - retinal vein occlusion, + cataracts, + macular hole.  He has a history of eye surgeries.  He gets intraocular injections  - his numbness and tingling in feet improved after starting B12. Foot exam 02/18/2024 by Dr. Janit.  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  B12 deficiency: -Diagnosed during investigation for numbness and tingling (peripheral neuropathy) -He was initially on B12  injections, then on 5000 mcg daily -He was on 5000 mcg every 3 days, changed by PCP 09/2019 -We changed to 5000 mcg weekly 09/2022  Reviewed his B12 levels: Lab Results  Component Value Date   VITAMINB12 934 (H) 05/19/2023   VITAMINB12 >1500 (H) 09/13/2022   VITAMINB12 832 10/09/2021   VITAMINB12 900 10/06/2020   VITAMINB12 748 12/22/2019   VITAMINB12 1,033 (H) 10/08/2019   VITAMINB12 1,172 (H) 08/19/2018   VITAMINB12 >1500 (H) 05/04/2018   VITAMINB12 1,249 (H) 02/18/2017   VITAMINB12 234 10/22/2016   He was admitted with NSTEMI 10/18/2015.  He had stents placed then.  He was going to the gym 5 out of 7 days, but stopped during the coronavirus pandemic.  He restarted to go to the gym afterwards. He saw Dr Ladona >> does not need to come back.  ROS: + See HPI  I reviewed pt's medications, allergies, PMH, social hx, family hx, and changes were documented in the history of present illness. Otherwise, unchanged from my initial visit note.  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   Coronary artery disease    Dysrhythmia    PAF   Eczema    GERD (gastroesophageal reflux disease)    Hernia, ventral    History of blood transfusion    Hyperlipidemia    Hypertension    Myocardial infarction (HCC)    Neuropathy    Obesity    Personal history of adenomatous colonic polyps 07/20/2012   3 + adenomas 2009 07/20/2012 2 diminutive polyps     Pneumonia 1942; 1950s X 1   Type II diabetes mellitus (HCC)    type II   Walking pneumonia 2000's X 1   Past Surgical History:  Procedure Laterality Date   A-FLUTTER ABLATION N/A 07/12/2022   Procedure: A-FLUTTER ABLATION;  Surgeon: Nancey Eulas BRAVO, MD;  Location: [facility] INVASIVE CV LAB;  Service: Cardiovascular;  Laterality: N/A;   ATRIAL FIBRILLATION ABLATION  N/A 07/12/2022   Procedure: ATRIAL FIBRILLATION ABLATION;  Surgeon: Nancey Eulas BRAVO, MD;  Location: [facility];  Service: Cardiovascular;  Laterality: N/A;   CARDIAC CATHETERIZATION N/A 10/19/2015   Procedure: Left Heart Cath and Coronary Angiography;  Surgeon: Rober Chroman, MD;  Location: [facility] INVASIVE CV LAB;  Service: Cardiovascular;  Laterality: N/A;   CARDIAC CATHETERIZATION N/A 10/19/2015   Procedure: Coronary Stent Intervention;  Surgeon: Rober Chroman, MD;  Location: [facility] INVASIVE CV [facility];  Service: Cardiovascular;  Laterality: N/A;   CATARACT EXTRACTION, BILATERAL Bilateral 01/2018   Dr. Rosan did surgery and he developed MD and is seeing specialist now for MD treatment   COLONOSCOPY     nothing showed up this time   COLONOSCOPY W/ BIOPSIES AND POLYPECTOMY  X 1   CORONARY ANGIOPLASTY     EXCISION MASS NECK Left 01/08/2021   Procedure: EXCISION MASS NECK;  Surgeon: Carlie Clark, MD;  [facility];  Service: ENT;  Laterality: Left;   FEMUR IM NAIL Right 07/26/2013   Procedure: INTRAMEDULLARY (IM) NAIL FEMORAL subtrochanteric;  Surgeon: Donnice JONETTA Car, MD;  Location: [facility] OR;  Service: Orthopedics;  Laterality: Right;   FRACTURE SURGERY     INGUINAL HERNIA REPAIR Left >3 times   INGUINAL HERNIA REPAIR Right 2012   NEPHRECTOMY Left 2000   TONSILLECTOMY  1940s   Social History   Socioeconomic History   Marital status: Widowed    Spouse name: Not on file   Number of children: 3   Years of education: Not on file   Highest education level: Master's degree (e.g., MA, MS, MEng, MEd, MSW, MBA)  Occupational History   Occupation: retired  Tobacco Use   Smoking status: Former    Current packs/day: 0.00    Types: Cigarettes, Pipe, Cigars    Start date: 09/09/1948    Quit date: 09/09/1968    Years since quitting: 56.0   Smokeless tobacco: Never   Tobacco comments:    smoking cigars- quit in 2006  , quit pipe 2000  Vaping Use   Vaping status: Never Used  Substance and  Sexual Activity   Alcohol use: Not Currently    Alcohol/week: 2.0 standard drinks of alcohol    Types: 2 Glasses of wine per week    Comment: 2 glasses on Tuesday   Drug use: No   Sexual activity: Never  Other Topics Concern   Not on file  Social History Narrative   Regular exercise: goes to the [employer] [facility] and walks   Caffeine use: daily; coffee   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     widowed    Three children, One in [place], one in [place] ,and one in [place]    HH - 2 : youngest daughter lives with him   Social Drivers of Health   Tobacco Use: Medium Risk (08/09/2024)   Patient History    Smoking Tobacco Use: Former    Smokeless Tobacco Use: Never    Passive Exposure: Not on file  Financial Resource Strain: Low Risk (01/07/2024)   Overall Financial Resource Strain (CARDIA)    Difficulty of Paying Living Expenses: Not hard at all  Food Insecurity: No Food Insecurity (01/07/2024)   Hunger Vital Sign    Worried About Running Out of Food in the Last Year: Never true    Ran Out of Food in the Last Year: Never true  Transportation Needs: No Transportation Needs (01/07/2024)   PRAPARE - Administrator, Civil Service (Medical): No    Lack of Transportation (Non-Medical): No  Physical Activity: Sufficiently Active (01/07/2024)   Exercise Vital Sign    Days of Exercise per Week: 5 days    Minutes of Exercise per Session: 40 min  Stress: No Stress Concern Present (01/07/2024)   [employer] of Occupational Health - Occupational Stress Questionnaire    Feeling of Stress : Not at all  Social Connections: Moderately Isolated (01/07/2024)   Social  Connection and Isolation Panel    Frequency of Communication with Friends and Family: More than three times a week    Frequency of Social Gatherings with Friends and Family: More than three times a week    Attends Religious Services: More than 4 times per year    Active Member of [employer] or Organizations: No    Attends  Banker Meetings: Never    Marital Status: Widowed  Intimate Partner Violence: Not At Risk (01/07/2024)   Humiliation, Afraid, Rape, and Kick questionnaire    Fear of Current or Ex-Partner: No    Emotionally Abused: No    Physically Abused: No    Sexually Abused: No  Depression (PHQ2-9): Low Risk (01/07/2024)   Depression (PHQ2-9)    PHQ-2 Score: 4  Alcohol Screen: Low Risk (01/07/2024)   Alcohol Screen    Last Alcohol Screening Score (AUDIT): 6  Housing: Unknown (01/07/2024)   Housing Stability Vital Sign    Unable to Pay for Housing in the Last Year: No    Number of Times Moved in the Last Year: Not on file    Homeless in the Last Year: No  Utilities: Not At Risk (01/07/2024)   AHC Utilities    Threatened with loss of utilities: No  Health Literacy: Adequate Health Literacy (01/07/2024)   B1300 Health Literacy    Frequency of need for help with medical instructions: Never   Current Outpatient Medications on File Prior to Visit  Medication Sig Dispense Refill   atorvastatin  (LIPITOR) 40 MG tablet Take 1 tablet (40 mg total) by mouth daily at 6 PM. 90 tablet 2   Blood Glucose Monitoring Suppl (CONTOUR NEXT MONITOR) w/Device KIT Use to check blood sugar once a day DxCode:E11.59 1 kit 0   Cholecalciferol  (VITAMIN D -3) 125 MCG (5000 UT) TABS Take 5,000 Units by mouth in the morning.     Cyanocobalamin  (VITAMIN B-12) 5000 MCG SUBL Place 5,000 mcg under the tongue every 3 (three) days.     ELIQUIS  5 MG TABS tablet TAKE 1 TABLET BY MOUTH TWICE  DAILY 200 tablet 2   EPINEPHrine  0.3 mg/0.3 mL IJ SOAJ injection Inject 0.3 mg into the muscle as needed for anaphylaxis.     glipiZIDE  (GLUCOTROL ) 5 MG tablet Take 1 tablet (5 mg total) by mouth daily before supper. 90 tablet 3   glucose blood (CONTOUR NEXT TEST) test strip Use to check blood sugar once a day DxCode:E11.59 100 each 12   Lancets MISC Use to check blood sugar once a day DxCode:E11.59 100 each 3   lisinopril  (ZESTRIL ) 5 MG  tablet TAKE 1 TABLET BY MOUTH DAILY 100 tablet 3   metFORMIN  (GLUCOPHAGE ) 1000 MG tablet TAKE 1 TABLET BY MOUTH TWICE  DAILY WITH MEALS 200 tablet 2   metoprolol  succinate (TOPROL -XL) 100 MG 24 hr tablet Take 1 Tablet in the morning and take 1/2 Tablet at night 135 tablet 2   Multiple Vitamins-Minerals (PRESERVISION AREDS 2 PO) Take 1 tablet by mouth in the morning and at bedtime.     OZEMPIC , 0.25 OR 0.5 MG/DOSE, 2 MG/3ML SOPN INJECT SUBCUTANEOUSLY 0.5 MG  EVERY WEEK 9 mL 3   tamsulosin  (FLOMAX ) 0.4 MG CAPS capsule TAKE 1 CAPSULE BY MOUTH DAILY 100 capsule 2   No current facility-administered medications on file prior to visit.   Allergies  Allergen Reactions   Bee Venom Anaphylaxis   Family History  Problem Relation Age of Onset   Diabetes Mother    Diabetes Sister  Drug abuse Other    Cancer Other    Heart disease Other    Lung disease Other    Colon cancer Neg Hx    Stomach cancer Neg Hx    PE: BP 120/72   Pulse 83   Ht 5' 10 (1.778 m)   Wt 244 lb 12.8 oz (111 kg)   SpO2 97%   BMI 35.13 kg/m  Wt Readings from Last 10 Encounters:  09/21/24 244 lb 12.8 oz (111 kg)  08/09/24 241 lb 9.6 oz (109.6 kg)  05/20/24 240 lb 12.8 oz (109.2 kg)  02/20/24 241 lb (109.3 kg)  02/18/24 244 lb 8 oz (110.9 kg)  01/19/24 244 lb 8 oz (110.9 kg)  01/16/24 246 lb 6.4 oz (111.8 kg)  10/22/23 251 lb (113.9 kg)  09/19/23 250 lb 12.8 oz (113.8 kg)  09/15/23 250 lb (113.4 kg)   Constitutional: overweight, in NAD Eyes: EOMI, no exophthalmos ENT: no thyromegaly, no cervical lymphadenopathy Cardiovascular: RRR, No MRG Respiratory: CTA B Musculoskeletal: no deformities Skin:  no rashes Neurological: no tremor with outstretched hands  ASSESSMENT: 1. DM2, not insulin -dependent, uncontrolled, with complications - CAD - s/p NSTEMI (Dr. Levern) - peripheral neuropathy - CKD   He does not have a family history of medullary thyroid  cancer or personal history of pancreatitis.   2. B12  def  3.  Peripheral neuropathy  4. HL  5.  History of elevated TSH -In the setting of amiodarone  treatment  PLAN:  1. Patient with longstanding, uncontrolled, type 2 diabetes, on oral antidiabetic regimen with metformin  and sulfonylurea and also weekly GLP-1 receptor agonist, with improvement in control at last visit, at 6.8%, decreased from 7.5%.  At that time, sugars appeared to be mostly at goal in the morning but he did have occasional hyperglycemic spikes possibly related to skipping the medication the night before.  He was forgetting glipizide  before dinner and taking it after dinner.  I advised him to use glipizide  IR 5 mg before dinner but did not take it if he forgot to take it before meal.  We did not change the rest of the regimen but I did advise him to check some sugars later in the day, also. - At today's visit, his weight does appear to be stable in the last 7 months so we discussed about increasing the Ozempic  slightly.  He does have some constipation, so for now we decided to only increase the dose to 0.75 mg weekly with the prospect of increasing it more, up to the 1 mg weekly dose, depending on his symptoms.  Will continue the rest of the regimen for now as sugars appear to be mostly at or slightly above target.  I again advised him to check some sugars later in the day, before and after dinner as he still wants to be checked in the morning.  He is having some dietary indiscretions, in the form of candy, as he does mention that he loves sweets.  We discussed that this can greatly increase his blood sugars and ideally he would not eat them. - I advised him to: Patient Instructions  Please continue: - Metformin  1000 mg 2x a day with meals - Glipizide  IR 5 mg before dinner  You can increase: - Ozempic  0.75 mg weekly in a.m.   On the Ozempic  1 mg pen: - 18 clicks  - 0.25 mg - 36 clicks - 0.5 mg - 54 clicks - 0.75 mg - 72 clicks - 1 mg  Please  check some sugars later in the  day, also.   Please return in 4 months with your sugar log.  - we checked his HbA1c: 7.0% (slightly higher) - advised to check sugars at different times of the day - 1x a day, rotating check times - advised for yearly eye exams >> he is UTD -ACR was elevated but improved after adding low-dose lisinopril .  He continues on 5 mg daily.  Further management per PCP. - return to clinic in 3-4 months  2.  And 3. Peripheral neuropathy and B12 deficiency -He has peripheral neuropathy probably related to both diabetes and B12 deficiency -We started B12 supplementation, which we were then able to reduce to 5000 mcg weekly - At last check, the B12 level was only slightly above target so we continued to same regimen  4. HL - Latest lipid panel was reviewed from 10/2023: LDL only slightly higher than our goal of less than 55, triglycerides elevated: Lab Results  Component Value Date   CHOL 139 10/22/2023   HDL 44.70 10/22/2023   LDLCALC 57 10/22/2023   LDLDIRECT 139.8 07/08/2014   TRIG 183.0 (H) 10/22/2023   CHOLHDL 3 10/22/2023  -He continues Lipitor 40 mg daily without side effects  5.  High TSH -He had an elevated TSH, 9.93, in the past while on amiodarone , but he stopped amiodarone  since then - TSH was normal: Lab Results  Component Value Date   TSH 2.78 10/22/2023  - No signs or symptoms of hypothyroidism - Will continue to manage him expectantly for this  Lela Fendt, MD PhD [facility] Endocrinology

## 2024-09-21 ENCOUNTER — Ambulatory Visit: Admitting: Internal Medicine

## 2024-09-21 ENCOUNTER — Encounter: Payer: Self-pay | Admitting: Internal Medicine

## 2024-09-21 VITALS — BP 120/72 | HR 83 | Ht 70.0 in | Wt 244.8 lb

## 2024-09-21 DIAGNOSIS — E538 Deficiency of other specified B group vitamins: Secondary | ICD-10-CM

## 2024-09-21 DIAGNOSIS — E1159 Type 2 diabetes mellitus with other circulatory complications: Secondary | ICD-10-CM | POA: Diagnosis not present

## 2024-09-21 DIAGNOSIS — Z7985 Long-term (current) use of injectable non-insulin antidiabetic drugs: Secondary | ICD-10-CM | POA: Diagnosis not present

## 2024-09-21 DIAGNOSIS — E1165 Type 2 diabetes mellitus with hyperglycemia: Secondary | ICD-10-CM

## 2024-09-21 DIAGNOSIS — E785 Hyperlipidemia, unspecified: Secondary | ICD-10-CM | POA: Diagnosis not present

## 2024-09-21 LAB — POCT GLYCOSYLATED HEMOGLOBIN (HGB A1C): Hemoglobin A1C: 7 % — AB (ref 4.0–5.6)

## 2024-09-21 MED ORDER — SEMAGLUTIDE (1 MG/DOSE) 4 MG/3ML ~~LOC~~ SOPN: 1.0000 mg | PEN_INJECTOR | SUBCUTANEOUS | 3 refills | Status: AC

## 2024-09-21 NOTE — Patient Instructions (Addendum)
 Please continue: - Metformin  1000 mg 2x a day with meals - Glipizide  IR 5 mg before dinner  You can increase: - Ozempic  0.75 mg weekly in a.m.   On the Ozempic  1 mg pen: - 18 clicks  - 0.25 mg - 36 clicks - 0.5 mg - 54 clicks - 0.75 mg - 72 clicks - 1 mg  Please check some sugars later in the day, also.   Please return in 4 months with your sugar log.

## 2024-10-22 ENCOUNTER — Encounter: Admitting: Adult Health

## 2024-11-17 ENCOUNTER — Ambulatory Visit: Admitting: Cardiovascular Disease

## 2025-01-24 ENCOUNTER — Ambulatory Visit: Admitting: Internal Medicine
